# Patient Record
Sex: Male | Born: 1935
Health system: Southern US, Community
[De-identification: ages and names within clinical notes are randomized; demographics above are authoritative.]

## PROBLEM LIST (undated history)

## (undated) DIAGNOSIS — I10 Essential (primary) hypertension: Secondary | ICD-10-CM

## (undated) DIAGNOSIS — I219 Acute myocardial infarction, unspecified: Secondary | ICD-10-CM

## (undated) DIAGNOSIS — I480 Paroxysmal atrial fibrillation: Secondary | ICD-10-CM

## (undated) DIAGNOSIS — R319 Hematuria, unspecified: Secondary | ICD-10-CM

## (undated) DIAGNOSIS — I272 Pulmonary hypertension, unspecified: Secondary | ICD-10-CM

## (undated) DIAGNOSIS — U071 COVID-19: Secondary | ICD-10-CM

## (undated) DIAGNOSIS — I6529 Occlusion and stenosis of unspecified carotid artery: Secondary | ICD-10-CM

## (undated) DIAGNOSIS — E039 Hypothyroidism, unspecified: Secondary | ICD-10-CM

## (undated) DIAGNOSIS — I2510 Atherosclerotic heart disease of native coronary artery without angina pectoris: Secondary | ICD-10-CM

## (undated) DIAGNOSIS — E782 Mixed hyperlipidemia: Secondary | ICD-10-CM

## (undated) DIAGNOSIS — E0590 Thyrotoxicosis, unspecified without thyrotoxic crisis or storm: Secondary | ICD-10-CM

## (undated) DIAGNOSIS — N1832 Chronic kidney disease, stage 3b: Secondary | ICD-10-CM

## (undated) DIAGNOSIS — R609 Edema, unspecified: Secondary | ICD-10-CM

## (undated) DIAGNOSIS — K922 Gastrointestinal hemorrhage, unspecified: Secondary | ICD-10-CM

## (undated) HISTORY — DX: Chronic kidney disease, stage 3b: N18.32

## (undated) HISTORY — DX: Hematuria, unspecified: R31.9

## (undated) HISTORY — DX: Pulmonary hypertension, unspecified: I27.20

## (undated) HISTORY — DX: Mixed hyperlipidemia: E78.2

## (undated) HISTORY — DX: Edema, unspecified: R60.9

## (undated) HISTORY — DX: Essential (primary) hypertension: I10

## (undated) HISTORY — DX: Gastrointestinal hemorrhage, unspecified: K92.2

## (undated) HISTORY — PX: HERNIA REPAIR: SHX51

## (undated) HISTORY — PX: CORONARY STENT PLACEMENT: SHX1402

## (undated) HISTORY — DX: Occlusion and stenosis of unspecified carotid artery: I65.29

## (undated) HISTORY — DX: Acute myocardial infarction, unspecified: I21.9

## (undated) HISTORY — DX: Thyrotoxicosis, unspecified without thyrotoxic crisis or storm: E05.90

## (undated) HISTORY — DX: Hypothyroidism, unspecified: E03.9

## (undated) HISTORY — PX: CHOLECYSTECTOMY: SHX55

---
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
## 1999-03-26 ENCOUNTER — Emergency Department (HOSPITAL_COMMUNITY): Admission: EM | Admit: 1999-03-26 | Discharge: 1999-03-26 | Payer: Self-pay | Admitting: Emergency Medicine

## 1999-04-03 ENCOUNTER — Emergency Department (HOSPITAL_COMMUNITY): Admission: EM | Admit: 1999-04-03 | Discharge: 1999-04-03 | Payer: Self-pay | Admitting: Emergency Medicine

## 2000-08-04 ENCOUNTER — Ambulatory Visit (HOSPITAL_COMMUNITY): Admission: RE | Admit: 2000-08-04 | Discharge: 2000-08-04 | Payer: Self-pay | Admitting: Internal Medicine

## 2000-08-05 ENCOUNTER — Encounter: Payer: Self-pay | Admitting: Internal Medicine

## 2000-10-08 ENCOUNTER — Ambulatory Visit (HOSPITAL_COMMUNITY): Admission: RE | Admit: 2000-10-08 | Discharge: 2000-10-08 | Payer: Self-pay | Admitting: Internal Medicine

## 2002-08-17 ENCOUNTER — Encounter (HOSPITAL_COMMUNITY): Admission: RE | Admit: 2002-08-17 | Discharge: 2002-09-16 | Payer: Self-pay | Admitting: Cardiology

## 2002-08-17 ENCOUNTER — Encounter: Payer: Self-pay | Admitting: Cardiology

## 2002-09-07 ENCOUNTER — Ambulatory Visit (HOSPITAL_COMMUNITY): Admission: RE | Admit: 2002-09-07 | Discharge: 2002-09-07 | Payer: Self-pay | Admitting: Internal Medicine

## 2003-01-07 ENCOUNTER — Emergency Department (HOSPITAL_COMMUNITY): Admission: EM | Admit: 2003-01-07 | Discharge: 2003-01-07 | Payer: Self-pay | Admitting: Emergency Medicine

## 2003-04-12 ENCOUNTER — Ambulatory Visit (HOSPITAL_COMMUNITY): Admission: RE | Admit: 2003-04-12 | Discharge: 2003-04-12 | Payer: Self-pay | Admitting: Family Medicine

## 2003-04-19 ENCOUNTER — Ambulatory Visit (HOSPITAL_COMMUNITY): Admission: RE | Admit: 2003-04-19 | Discharge: 2003-04-19 | Payer: Self-pay | Admitting: Family Medicine

## 2003-06-20 ENCOUNTER — Ambulatory Visit (HOSPITAL_COMMUNITY): Admission: RE | Admit: 2003-06-20 | Discharge: 2003-06-20 | Payer: Self-pay | Admitting: Endocrinology

## 2003-06-20 ENCOUNTER — Encounter (INDEPENDENT_AMBULATORY_CARE_PROVIDER_SITE_OTHER): Payer: Self-pay | Admitting: *Deleted

## 2005-06-03 ENCOUNTER — Ambulatory Visit (HOSPITAL_COMMUNITY): Admission: RE | Admit: 2005-06-03 | Discharge: 2005-06-03 | Payer: Self-pay | Admitting: Family Medicine

## 2007-09-07 ENCOUNTER — Ambulatory Visit (HOSPITAL_COMMUNITY): Admission: RE | Admit: 2007-09-07 | Discharge: 2007-09-07 | Payer: Self-pay | Admitting: Family Medicine

## 2009-01-20 DIAGNOSIS — I214 Non-ST elevation (NSTEMI) myocardial infarction: Secondary | ICD-10-CM

## 2009-01-20 HISTORY — DX: Non-ST elevation (NSTEMI) myocardial infarction: I21.4

## 2009-01-26 ENCOUNTER — Inpatient Hospital Stay (HOSPITAL_COMMUNITY): Admission: EM | Admit: 2009-01-26 | Discharge: 2009-01-30 | Payer: Self-pay | Admitting: Cardiovascular Disease

## 2009-01-26 ENCOUNTER — Encounter: Payer: Self-pay | Admitting: Emergency Medicine

## 2009-01-26 ENCOUNTER — Ambulatory Visit: Payer: Self-pay | Admitting: Cardiovascular Disease

## 2009-01-29 ENCOUNTER — Encounter: Payer: Self-pay | Admitting: Cardiovascular Disease

## 2009-02-26 DIAGNOSIS — I10 Essential (primary) hypertension: Secondary | ICD-10-CM | POA: Insufficient documentation

## 2009-02-26 DIAGNOSIS — I219 Acute myocardial infarction, unspecified: Secondary | ICD-10-CM | POA: Insufficient documentation

## 2009-02-26 DIAGNOSIS — E0590 Thyrotoxicosis, unspecified without thyrotoxic crisis or storm: Secondary | ICD-10-CM | POA: Insufficient documentation

## 2009-02-26 DIAGNOSIS — E039 Hypothyroidism, unspecified: Secondary | ICD-10-CM | POA: Insufficient documentation

## 2009-02-27 ENCOUNTER — Ambulatory Visit: Payer: Self-pay | Admitting: Cardiovascular Disease

## 2009-02-27 DIAGNOSIS — R609 Edema, unspecified: Secondary | ICD-10-CM | POA: Insufficient documentation

## 2009-03-12 ENCOUNTER — Telehealth (INDEPENDENT_AMBULATORY_CARE_PROVIDER_SITE_OTHER): Payer: Self-pay | Admitting: *Deleted

## 2009-03-13 ENCOUNTER — Encounter: Payer: Self-pay | Admitting: Cardiology

## 2009-03-13 ENCOUNTER — Ambulatory Visit: Payer: Self-pay | Admitting: Cardiology

## 2009-03-13 ENCOUNTER — Encounter (HOSPITAL_COMMUNITY): Admission: RE | Admit: 2009-03-13 | Discharge: 2009-05-22 | Payer: Self-pay | Admitting: Cardiovascular Disease

## 2009-03-13 ENCOUNTER — Ambulatory Visit: Payer: Self-pay

## 2009-05-29 ENCOUNTER — Telehealth: Payer: Self-pay | Admitting: Cardiovascular Disease

## 2009-06-22 ENCOUNTER — Telehealth: Payer: Self-pay | Admitting: Cardiovascular Disease

## 2009-06-29 ENCOUNTER — Ambulatory Visit: Payer: Self-pay | Admitting: Cardiovascular Disease

## 2009-07-06 LAB — CONVERTED CEMR LAB
Basophils Absolute: 0 10*3/uL (ref 0.0–0.1)
Basophils Relative: 0.4 % (ref 0.0–3.0)
Eosinophils Absolute: 0.1 10*3/uL (ref 0.0–0.7)
Eosinophils Relative: 1.7 % (ref 0.0–5.0)
HCT: 39 % (ref 39.0–52.0)
Hemoglobin: 13.2 g/dL (ref 13.0–17.0)
Lymphocytes Relative: 28.5 % (ref 12.0–46.0)
Lymphs Abs: 1.7 10*3/uL (ref 0.7–4.0)
MCHC: 33.8 g/dL (ref 30.0–36.0)
MCV: 88.7 fL (ref 78.0–100.0)
Monocytes Absolute: 0.5 10*3/uL (ref 0.1–1.0)
Monocytes Relative: 8.1 % (ref 3.0–12.0)
Neutro Abs: 3.7 10*3/uL (ref 1.4–7.7)
Neutrophils Relative %: 61.3 % (ref 43.0–77.0)
Platelets: 167 10*3/uL (ref 150.0–400.0)
RBC: 4.41 M/uL (ref 4.22–5.81)
RDW: 13.5 % (ref 11.5–14.6)
WBC: 6 10*3/uL (ref 4.5–10.5)

## 2009-08-22 ENCOUNTER — Telehealth: Payer: Self-pay | Admitting: Cardiovascular Disease

## 2009-09-10 ENCOUNTER — Ambulatory Visit: Payer: Self-pay | Admitting: Cardiovascular Disease

## 2009-09-10 DIAGNOSIS — E782 Mixed hyperlipidemia: Secondary | ICD-10-CM | POA: Insufficient documentation

## 2010-02-19 NOTE — Assessment & Plan Note (Signed)
Summary: 6 month rov/sl   Visit Type:  Follow-up Primary Provider:  Dr.Golding  CC:  no cardiology complaints.  History of Present Illness: Derek Foster is seen today post hospital D/C for anterior MI with stent to the LAD 1/21 with Dr Derek Foster.  He had moderate residual RCA disease.  He has done well.  He is ambulating without angina.  Myovue on 2/22 showed no ischemia or infarction with EF 66%.  He has had some easy bruising on ASA/Plavix.  Dr Derek Foster suggested Vit C but I'm not sure this will help/  He wife has had problemx post back surgery with falls and a broken foot.  This has kept him from exercising and going to the [facility].  He denies SSCP, palpitations, diaphoresis, dyspnea.  He has chronic mild LE edema due to varicosities  Current Problems (verified): 1)  Edema  (ICD-782.3) 2)  Myocardial Infarction  (ICD-410.90) 3)  Hypertension  (ICD-401.9) 4)  Hypothyroidism  (ICD-244.9)  Current Medications (verified): 1)  Aspirin 81 Mg Tbec (Aspirin) .... Take One Tablet By Mouth Daily 2)  Plavix 75 Mg Tabs (Clopidogrel Bisulfate) .... Take One Tablet By Mouth Daily 3)  Nitrostat 0.4 Mg Subl (Nitroglycerin) .Marland Kitchen.. 1 Tablet Under Tongue At Onset of Chest Pain; You May Repeat Every 5 Minutes For Up To 3 Doses. 4)  Crestor 20 Mg Tabs (Rosuvastatin Calcium) .... Take One Tablet By Mouth Daily. 5)  Bisoprolol-Hydrochlorothiazide 5-6.25 Mg Tabs (Bisoprolol-Hydrochlorothiazide) .Marland Kitchen.. 1 Tab By Mouth Once Daily 6)  Fish Oil Concentrate 1000 Mg Caps (Omega-3 Fatty Acids) .... Take 2 Caps Daily 7)  Lisinopril 20 Mg Tabs (Lisinopril) .... Take One Tablet By Mouth Daily 8)  Multivitamins   Tabs (Multiple Vitamin) .Marland Kitchen.. 1 Tab By Mouth Once Daily 9)  Lansoprazole 30 Mg Tbdp (Lansoprazole) .... Take 1 Tab Daily  Allergies (verified): No Known Drug Allergies  Past History:  Past Medical History: Last updated: 03-28-09 Current Problems:  MYOCARDIAL INFARCTION (ICD-410.90) 1/11 SEMI with deep anterior T wave  inversions.  LAD stent Derek Foster moderate residual RCA disease HYPERTENSION (ICD-401.9) HYPOTHYROIDISM (ICD-244.9)  Past Surgical History: Last updated: 02/27/2009 Status post hernia repair x2.  Status post cholecystectomy x2   Total colonoscopy. Successful percutaneous coronary intervention with ultimate stenting with a 3.0- x 18-mm drug-eluting PROMUS stent postdilated   to 3.42 mm and tapering to 3.34 mm, done with bivalve.  Derek Foster, M.D.     TK/MEDQ  D:  01/27/2009  T:  01/27/2009  Job:  [id number]  cc:   Derek Foster, M.D.  Derek Carrow, MD   Family History: Last updated: 2009/03/28  Father died of an MI at age 49, sister recently died of   lung cancer.   Social History: Last updated: 03/28/2009  He lives in [place] with his wife.  He is retired   from [employer] in [place].  He has not smoked in 53 years, no alcohol.      Review of Systems       Denies fever, malais, weight loss, blurry vision, decreased visual acuity, cough, sputum, SOB, hemoptysis, pleuritic pain, palpitaitons, heartburn, abdominal pain, melena, claudication, or rash.   Vital Signs:  Patient profile:   75 year old male Weight:      259 pounds Pulse rate:   51 / minute BP sitting:   146 / 67  (right arm)  Vitals Entered By: Derek Saa, CNA (September 10, 2009 9:28 AM)  Physical Exam  General:  Affect appropriate Healthy:  appears stated age HEENT: normal  Neck supple with no adenopathy JVP normal no bruits no thyromegaly Lungs clear with no wheezing and good diaphragmatic motion Heart:  S1/S2 no murmur,rub, gallop or click PMI normal Abdomen: benighn, BS positve, no tenderness, no AAA no bruit.  No HSM or HJR Distal pulses intact with no bruits Mild  edema with bilateral varicose veins Neuro non-focal Skin warm and dry    Impression & Recommendations:  Problem # 1:  EDEMA (ICD-782.3) Stable elevate legs at end of day and low sodium diet.  Refer to vein clinic at patient  discretion  Problem # 2:  HYPERTENSION (ICD-401.9) Well controlled continue current meds His updated medication list for this problem includes:    Aspirin 81 Mg Tbec (Aspirin) .Marland Kitchen... Take one tablet by mouth daily    Bisoprolol-hydrochlorothiazide 5-6.25 Mg Tabs (Bisoprolol-hydrochlorothiazide) .Marland Kitchen... 1 tab by mouth once daily    Lisinopril 20 Mg Tabs (Lisinopril) .Marland Kitchen... Take one tablet by mouth daily  Problem # 3:  MYOCARDIAL INFARCTION (ICD-410.90) Non ischemic myovue 03/13/09 with no angina.  Continue ASA and Plavix His updated medication list for this problem includes:    Aspirin 81 Mg Tbec (Aspirin) .Marland Kitchen... Take one tablet by mouth daily    Plavix 75 Mg Tabs (Clopidogrel bisulfate) .Marland Kitchen... Take one tablet by mouth daily    Nitrostat 0.4 Mg Subl (Nitroglycerin) .Marland Kitchen... 1 tablet under tongue at onset of chest pain; you may repeat every 5 minutes for up to 3 doses.    Bisoprolol-hydrochlorothiazide 5-6.25 Mg Tabs (Bisoprolol-hydrochlorothiazide) .Marland Kitchen... 1 tab by mouth once daily    Lisinopril 20 Mg Tabs (Lisinopril) .Marland Kitchen... Take one tablet by mouth daily  Problem # 4:  MIXED HYPERLIPIDEMIA (ICD-272.2) Labs per primary.  Continue statin His updated medication list for this problem includes:    Crestor 20 Mg Tabs (Rosuvastatin calcium) .Marland Kitchen... Take one tablet by mouth daily.  Patient Instructions: 1)  Your physician recommends that you schedule a follow-up appointment in: 6 months 2)  Your physician recommends that you continue on your current medications as directed. Please refer to the Current Medication list given to you today.

## 2010-02-19 NOTE — Progress Notes (Signed)
Summary: refill request  Phone Note Refill Request   Refills Requested: Medication #1:  PLAVIX 75 MG TABS Take one tablet by mouth daily [facility]   Method Requested: Telephone to Pharmacy Initial call taken by: Glynda Jaeger,  August 22, 2009 4:27 PM    Prescriptions: PLAVIX 75 MG TABS (CLOPIDOGREL BISULFATE) Take one tablet by mouth daily  #30 x 12   Entered by:   Kem Parkinson   Authorized by:   Colon Branch, MD, [facility]   Signed by:   Kem Parkinson on 08/23/2009   Method used:   Electronically to        [facility] (retail)       [address]       [place], [place]  [address]       Ph: [phone]       Fax: [phone]   RxID:   [id number]

## 2010-02-19 NOTE — Assessment & Plan Note (Signed)
Summary: Cardiology Nuclear Testing  Nuclear Med Background Indications for Stress Test: Evaluation for Ischemia, Stent Patency   History: Echo, Heart Catheterization, Myocardial Infarction, Stents  History Comments: 01/27/09 MI: NSTEMI > Heart Cath: EF=50%, 95-99% LAD > LAD stent 1/11 Echo: EF=50-55%, mild LVH     Nuclear Pre-Procedure Cardiac Risk Factors: Family History - CAD, History of Smoking, Hypertension, Lipids Caffeine/Decaff Intake: none NPO After: 8:30 AM Lungs: clear IV 0.9% NS with Angio Cath: 18g     IV Site: (R) AC IV Started by: Stanton Kidney EMT-P Chest Size (in) 48     Height (in): 72 Weight (lb): 271 BMI: 36.89  Nuclear Med Study 1 or 2 day study:  1 day     Stress Test Type:  Stress Reading MD:  Olga Millers, MD     Referring MD:  P.Nishan Resting Radionuclide:  Technetium 36m Tetrofosmin     Resting Radionuclide Dose:  10.7 mCi  Stress Radionuclide:  Technetium 40m Tetrofosmin     Stress Radionuclide Dose:  33.0 mCi   Stress Protocol Exercise Time (min):  4:01 min     Max HR:  150 bpm     Predicted Max HR:  148 bpm  Max Systolic BP: 200 mm Hg     Percent Max HR:  101.35 %     METS: 5.8 Rate Pressure Product:  47829    Stress Test Technologist:  Milana Na EMT-P     Nuclear Technologist:  Burna Mortimer Deal RT-N  Rest Procedure  Myocardial perfusion imaging was performed at rest 45 minutes following the intravenous administration of Myoview Technetium 72m Tetrofosmin.  Stress Procedure  The patient exercised for 4:01.  The patient stopped due to fatigue and denied any chest pain.  There were non specific ST-T wave changes, + occ pvcs, pacs, cuplets, and ventricular tachycardia.  Myoview was injected at peak exercise and myocardial perfusion imaging was performed after a brief delay.  QPS Raw Data Images:  Acuisition technically good; normal left ventricular size. Stress Images:  There is normal uptake in all areas. Rest Images:  Normal homogeneous  uptake in all areas of the myocardium. Subtraction (SDS):  No evidence of ischemia. Transient Ischemic Dilatation:  .84  (Normal <1.22)  Lung/Heart Ratio:  .30  (Normal <0.45)  Quantitative Gated Spect Images QGS EDV:  120 ml QGS ESV:  41 ml QGS EF:  66 % QGS cine images:  Normal wall motion.   Overall Impression  Exercise Capacity: Poor exercise capacity. BP Response: Hypertensive blood pressure response. Clinical Symptoms: No chest pain ECG Impression: No diagnostic ST segment change suggestive of ischemia; occasional PVCs and nonsustained VT (longest 5 beats). Overall Impression: There is no sign of scar or ischemia.  Appended Document: Cardiology Nuclear Testing Normal nuclear  Appended Document: Cardiology Nuclear Testing Left message to call back    Appended Document: Cardiology Nuclear Testing pt aware of results

## 2010-02-19 NOTE — Progress Notes (Signed)
Summary: Nuclear Pre-Procedure  Phone Note Outgoing Call Call back at [facility] Phone [phone]   Call placed by: Stanton Kidney, EMT-P,  March 12, 2009 2:51 PM Action Taken: Phone Call Completed Summary of Call: Reviewed information on Myoview Information Sheet (see scanned document for further details).  Spoke with Patient's wife.    Nuclear Med Background Indications for Stress Test: Evaluation for Ischemia, Stent Patency   History: Echo, Heart Catheterization, Myocardial Infarction, Stents  History Comments: 01/27/09 MI: NSTEMI > Heart Cath: EF=50%, 95-99% LAD > LAD stent 1/11 Echo: EF=50-55%, mild LVH     Nuclear Pre-Procedure Cardiac Risk Factors: Family History - CAD, History of Smoking, Hypertension, Lipids Height (in): 72

## 2010-02-19 NOTE — Miscellaneous (Signed)
  Clinical Lists Changes  Observations: Added new observation of CARDCATHFIND:  IMPRESSION:   1. Acute coronary artery syndrome/onset non-ST-segment elevation       myocardial infarction resulting in deeply inverted T-wave       inversions precordially secondary to subtotal high-grade stenosis       of the left anterior descending artery proximally.  Non-ST-segment       elevation MI and is now status post percutaneous coronary       intervention/stenting to a high-grade 95-99% left anterior       descending stenosis.  The patient will ultimately require possible       further intervention to his right coronary artery subsequently.  He       will be started on aggressive medical therapy.   2. Normal codominant circumflex system.   3. A 70-80% mid right coronary artery narrowing.   4. Mild left ventricular dysfunction with an ejection fraction of 50%       with mild hypo-contractility in anterolateral wall.   5. Successful percutaneous coronary intervention with ultimate       stenting with a 3.0- x 18-mm drug-eluting PROMUS stent postdilated       to 3.42 mm and tapering to 3.34 mm, done with bivalve.   6. Bivalirudin/Plavix/intracoronary and intravascular nitroglycerin.                  ______________________________   Nicki Guadalajara, M.D.            TK/MEDQ  D:  01/27/2009  T:  01/27/2009  Job:  [id number]      cc:   Patrica Duel, M.D.   Verne Carrow, MD   (01/27/2009 15:04)      Cardiac Cath  Procedure date:  01/27/2009  Findings:       IMPRESSION:   1. Acute coronary artery syndrome/onset non-ST-segment elevation       myocardial infarction resulting in deeply inverted T-wave       inversions precordially secondary to subtotal high-grade stenosis       of the left anterior descending artery proximally.  Non-ST-segment       elevation MI and is now status post percutaneous coronary       intervention/stenting to a high-grade 95-99% left anterior   descending stenosis.  The patient will ultimately require possible       further intervention to his right coronary artery subsequently.  He       will be started on aggressive medical therapy.   2. Normal codominant circumflex system.   3. A 70-80% mid right coronary artery narrowing.   4. Mild left ventricular dysfunction with an ejection fraction of 50%       with mild hypo-contractility in anterolateral wall.   5. Successful percutaneous coronary intervention with ultimate       stenting with a 3.0- x 18-mm drug-eluting PROMUS stent postdilated       to 3.42 mm and tapering to 3.34 mm, done with bivalve.   6. Bivalirudin/Plavix/intracoronary and intravascular nitroglycerin.                  ______________________________   Nicki Guadalajara, M.D.            TK/MEDQ  D:  01/27/2009  T:  01/27/2009  Job:  [id number]      cc:   Patrica Duel, M.D.   Verne Carrow, MD

## 2010-02-19 NOTE — Progress Notes (Signed)
Summary: [facility] Paperwork  Phone Note From Other Clinic Call back at [phone]   Caller: Erin/[facility] [place] Summary of Call: request call back about paperwork she faxed over on 4/28 Initial call taken by: Migdalia Dk,  May 29, 2009 2:41 PM  Follow-up for Phone Call        spoke with erin, paperwork never received. she will refax Deliah Goody, RN  May 29, 2009 3:10 PM

## 2010-02-19 NOTE — Assessment & Plan Note (Signed)
Summary: eph/per aft hrs sheet/jss   History of Present Illness: Derek Foster is seen today post hospital D/C for anterior MI with stent to the LAD 1/21 with Dr Tresa Endo.  He had moderate residual RCA disease.  He has done well.  He is ambulating without angina.  He is compliant with his meds.  I told him I would like him to have an ETT mhyovue before going to the [facility] to assess scar burden and ischemia in the RCA territory.  He denies SOB, palpitations, syncope.  He is carrying nitro with him.    Current Problems (verified): 1)  Myocardial Infarction  (ICD-410.90) 2)  Hypertension  (ICD-401.9) 3)  Hypothyroidism  (ICD-244.9)  Current Medications (verified): 1)  Aspirin Ec 325 Mg Tbec (Aspirin) .... Take One Tablet By Mouth Daily 2)  Plavix 75 Mg Tabs (Clopidogrel Bisulfate) .... Take One Tablet By Mouth Daily 3)  Nitrostat 0.4 Mg Subl (Nitroglycerin) .Marland Kitchen.. 1 Tablet Under Tongue At Onset of Chest Pain; You May Repeat Every 5 Minutes For Up To 3 Doses. 4)  Crestor 20 Mg Tabs (Rosuvastatin Calcium) .... Take One Tablet By Mouth Daily. 5)  Bisoprolol-Hydrochlorothiazide 5-6.25 Mg Tabs (Bisoprolol-Hydrochlorothiazide) .Marland Kitchen.. 1 Tab By Mouth Once Daily 6)  Fish Oil   Oil (Fish Oil) .Marland Kitchen.. 1 Tab By Mouth Once Daily 7)  Levothroid 50 Mcg Tabs (Levothyroxine Sodium) .Marland Kitchen.. 1 Tab By Mouth Once Daily 8)  Lisinopril 20 Mg Tabs (Lisinopril) .... Take One Tablet By Mouth Daily 9)  Multivitamins   Tabs (Multiple Vitamin) .Marland Kitchen.. 1 Tab By Mouth Once Daily 10)  Eye Drops Allergy Relief 0.05-0.25 % Soln (Tetrahydrozoline-Zn Sulfate) .... As Directed  Allergies (verified): No Known Drug Allergies  Past History:  Past Medical History: Last updated: Feb 27, 2009 Current Problems:  MYOCARDIAL INFARCTION (ICD-410.90) 1/11 SEMI with deep anterior T wave inversions.  LAD stent [name] moderate residual RCA disease HYPERTENSION (ICD-401.9) HYPOTHYROIDISM (ICD-244.9)  Past Surgical History: Last updated: February 27, 2009 Status post  hernia repair x2.  Status post cholecystectomy x2   Total colonoscopy.  Family History: Last updated: 02/27/2009  Father died of an MI at age 54, sister recently died of   lung cancer.   Social History: Last updated: 2009-02-27  He lives in [place] with his wife.  He is retired   from [employer]er, [employer] in [place].  He has not smoked in 53 years, no alcohol.      Review of Systems       Denies fever, malais, weight loss, blurry vision, decreased visual acuity, cough, sputum, SOB, hemoptysis, pleuritic pain, palpitaitons, heartburn, abdominal pain, melena, lower extremity edema, claudication, or rash. all other systems reviewed and negative  Vital Signs:  Patient profile:   75 year old male Height:      72 inches Weight:      276 pounds BMI:     37.57 Pulse rate:   57 / minute Resp:     12 per minute BP sitting:   147 / 76  (left arm)  Vitals Entered By: Kem Parkinson (February 27, 2009 3:10 PM)  Physical Exam  General:  Affect appropriate Healthy:  appears stated age HEENT: normal Neck supple with no adenopathy JVP normal no bruits no thyromegaly Lungs clear with no wheezing and good diaphragmatic motion Heart:  S1/S2 no murmur,rub, gallop or click PMI normal Abdomen: benighn, BS positve, no tenderness, no AAA no bruit.  No HSM or HJR Distal pulses intact with no bruits No edema Neuro non-focal Skin warm and dry  Impression & Recommendations:  Problem # 1:  MYOCARDIAL INFARCTION (ICD-410.90) Myovue in 2 weeks to assess RCA lesion and EF. His updated medication list for this problem includes:    Aspirin Ec 325 Mg Tbec (Aspirin) .Marland Kitchen... Take one tablet by mouth daily    Plavix 75 Mg Tabs (Clopidogrel bisulfate) .Marland Kitchen... Take one tablet by mouth daily    Nitrostat 0.4 Mg Subl (Nitroglycerin) .Marland Kitchen... 1 tablet under tongue at onset of chest pain; you may repeat every 5 minutes for up to 3 doses.    Bisoprolol-hydrochlorothiazide 5-6.25 Mg Tabs  (Bisoprolol-hydrochlorothiazide) .Marland Kitchen... 1 tab by mouth once daily    Lisinopril 20 Mg Tabs (Lisinopril) .Marland Kitchen... Take one tablet by mouth daily  Orders: Nuclear Stress Test (Nuc Stress Test)  Problem # 2:  HYPERTENSION (ICD-401.9) Well controlled His updated medication list for this problem includes:    Aspirin Ec 325 Mg Tbec (Aspirin) .Marland Kitchen... Take one tablet by mouth daily    Bisoprolol-hydrochlorothiazide 5-6.25 Mg Tabs (Bisoprolol-hydrochlorothiazide) .Marland Kitchen... 1 tab by mouth once daily    Lisinopril 20 Mg Tabs (Lisinopril) .Marland Kitchen... Take one tablet by mouth daily  Problem # 3:  EDEMA (ICD-782.3) Chronic.  Preceded MI.  Continue low dose diuretic.    Patient Instructions: 1)  Your physician recommends that you schedule a follow-up appointment in: 6 MONTHS 2)  Your physician has requested that you have an exercise stress myoview.  For further information please visit [email or web address]  Please follow instruction sheet, as given.   EKG Report  Procedure date:  02/27/2009  Findings:      Sinus brady 52 otherwise normal ECG

## 2010-02-19 NOTE — Progress Notes (Signed)
Summary: questions about Aspirin  Phone Note Call from Patient Call back at Home Phone [phone]   Caller: Patient Summary of Call: Pt have question  about taking Aspirin Initial call taken by: Judie Grieve,  June 22, 2009 10:57 AM  Follow-up for Phone Call        spoke with pt, he was recently seen by his medical md and he has alot of bruising. he questioned if he could decrease his aspirin to 81mg . okay given for pt to decrease aspirin Deliah Goody, RN  June 22, 2009 1:31 PM   Additional Follow-up for Phone Call Additional follow up Details #1::        Should have CBC and PLT count as well Additional Follow-up by: Colon Branch, MD, [facility],  June 25, 2009 4:56 PM    Additional Follow-up for Phone Call Additional follow up Details #2::    PT AWARE WILL COME IN ON FRIDAY 06/29/09 FOR LABS. Follow-up by: Scherrie Bateman, LPN,  June 27, 1476 2:11 PM  New/Updated Medications: ASPIRIN 81 MG TBEC (ASPIRIN) Take one tablet by mouth daily

## 2010-04-07 LAB — HEMOGLOBIN A1C
Hgb A1c MFr Bld: 4.5 % — ABNORMAL LOW (ref 4.6–6.1)
Mean Plasma Glucose: 82 mg/dL

## 2010-04-07 LAB — POCT I-STAT, CHEM 8
BUN: 10 mg/dL (ref 6–23)
Calcium, Ion: 1.15 mmol/L (ref 1.12–1.32)
Chloride: 102 mEq/L (ref 96–112)
Creatinine, Ser: 0.8 mg/dL (ref 0.4–1.5)
Glucose, Bld: 114 mg/dL — ABNORMAL HIGH (ref 70–99)
HCT: 35 % — ABNORMAL LOW (ref 39.0–52.0)
Hemoglobin: 11.9 g/dL — ABNORMAL LOW (ref 13.0–17.0)
Potassium: 3.7 mEq/L (ref 3.5–5.1)
Sodium: 136 mEq/L (ref 135–145)
TCO2: 24 mmol/L (ref 0–100)

## 2010-04-07 LAB — CBC
HCT: 34.8 % — ABNORMAL LOW (ref 39.0–52.0)
HCT: 37.2 % — ABNORMAL LOW (ref 39.0–52.0)
HCT: 40.2 % (ref 39.0–52.0)
Hemoglobin: 12.3 g/dL — ABNORMAL LOW (ref 13.0–17.0)
Hemoglobin: 12.9 g/dL — ABNORMAL LOW (ref 13.0–17.0)
Hemoglobin: 13.6 g/dL (ref 13.0–17.0)
MCHC: 33.7 g/dL (ref 30.0–36.0)
MCHC: 34.5 g/dL (ref 30.0–36.0)
MCHC: 35.3 g/dL (ref 30.0–36.0)
MCV: 88.9 fL (ref 78.0–100.0)
MCV: 89.5 fL (ref 78.0–100.0)
MCV: 89.8 fL (ref 78.0–100.0)
Platelets: 146 10*3/uL — ABNORMAL LOW (ref 150–400)
Platelets: 153 10*3/uL (ref 150–400)
Platelets: 181 10*3/uL (ref 150–400)
RBC: 3.91 MIL/uL — ABNORMAL LOW (ref 4.22–5.81)
RBC: 4.16 MIL/uL — ABNORMAL LOW (ref 4.22–5.81)
RBC: 4.47 MIL/uL (ref 4.22–5.81)
RDW: 13.1 % (ref 11.5–15.5)
RDW: 13.4 % (ref 11.5–15.5)
RDW: 13.5 % (ref 11.5–15.5)
WBC: 6.1 10*3/uL (ref 4.0–10.5)
WBC: 6.8 10*3/uL (ref 4.0–10.5)
WBC: 7.2 10*3/uL (ref 4.0–10.5)

## 2010-04-07 LAB — DIFFERENTIAL
Basophils Absolute: 0 10*3/uL (ref 0.0–0.1)
Basophils Absolute: 0 10*3/uL (ref 0.0–0.1)
Basophils Relative: 0 % (ref 0–1)
Basophils Relative: 1 % (ref 0–1)
Eosinophils Absolute: 0.1 10*3/uL (ref 0.0–0.7)
Eosinophils Absolute: 0.1 10*3/uL (ref 0.0–0.7)
Eosinophils Relative: 1 % (ref 0–5)
Eosinophils Relative: 2 % (ref 0–5)
Lymphocytes Relative: 22 % (ref 12–46)
Lymphocytes Relative: 23 % (ref 12–46)
Lymphs Abs: 1.4 10*3/uL (ref 0.7–4.0)
Lymphs Abs: 1.6 10*3/uL (ref 0.7–4.0)
Monocytes Absolute: 0.5 10*3/uL (ref 0.1–1.0)
Monocytes Absolute: 0.7 10*3/uL (ref 0.1–1.0)
Monocytes Relative: 10 % (ref 3–12)
Monocytes Relative: 8 % (ref 3–12)
Neutro Abs: 4.1 10*3/uL (ref 1.7–7.7)
Neutro Abs: 4.8 10*3/uL (ref 1.7–7.7)
Neutrophils Relative %: 66 % (ref 43–77)
Neutrophils Relative %: 68 % (ref 43–77)

## 2010-04-07 LAB — BRAIN NATRIURETIC PEPTIDE: Pro B Natriuretic peptide (BNP): 286 pg/mL — ABNORMAL HIGH (ref 0.0–100.0)

## 2010-04-07 LAB — CARDIAC PANEL(CRET KIN+CKTOT+MB+TROPI)
CK, MB: 10.8 ng/mL (ref 0.3–4.0)
CK, MB: 2.2 ng/mL (ref 0.3–4.0)
CK, MB: 6.7 ng/mL (ref 0.3–4.0)
CK, MB: 8.8 ng/mL (ref 0.3–4.0)
Relative Index: INVALID (ref 0.0–2.5)
Relative Index: INVALID (ref 0.0–2.5)
Relative Index: INVALID (ref 0.0–2.5)
Relative Index: INVALID (ref 0.0–2.5)
Total CK: 25 U/L (ref 7–232)
Total CK: 36 U/L (ref 7–232)
Total CK: 52 U/L (ref 7–232)
Total CK: 60 U/L (ref 7–232)
Troponin I: 0.44 ng/mL — ABNORMAL HIGH (ref 0.00–0.06)
Troponin I: 0.92 ng/mL (ref 0.00–0.06)
Troponin I: 1.03 ng/mL (ref 0.00–0.06)
Troponin I: 1.2 ng/mL (ref 0.00–0.06)

## 2010-04-07 LAB — LIPID PANEL
Cholesterol: 147 mg/dL (ref 0–200)
HDL: 31 mg/dL — ABNORMAL LOW (ref >39)
LDL Cholesterol: 104 mg/dL — ABNORMAL HIGH (ref 0–99)
Total CHOL/HDL Ratio: 4.7 RATIO
Triglycerides: 60 mg/dL (ref <150)
VLDL: 12 mg/dL (ref 0–40)

## 2010-04-07 LAB — BASIC METABOLIC PANEL
BUN: 13 mg/dL (ref 6–23)
BUN: 9 mg/dL (ref 6–23)
CO2: 28 mEq/L (ref 19–32)
CO2: 29 mEq/L (ref 19–32)
Calcium: 8.3 mg/dL — ABNORMAL LOW (ref 8.4–10.5)
Calcium: 9 mg/dL (ref 8.4–10.5)
Chloride: 104 mEq/L (ref 96–112)
Chloride: 104 mEq/L (ref 96–112)
Creatinine, Ser: 0.86 mg/dL (ref 0.4–1.5)
Creatinine, Ser: 1.07 mg/dL (ref 0.4–1.5)
GFR calc Af Amer: >60 mL/min (ref >60)
GFR calc Af Amer: >60 mL/min (ref >60)
GFR calc non Af Amer: >60 mL/min (ref >60)
GFR calc non Af Amer: >60 mL/min (ref >60)
Glucose, Bld: 126 mg/dL — ABNORMAL HIGH (ref 70–99)
Glucose, Bld: 97 mg/dL (ref 70–99)
Potassium: 3.4 mEq/L — ABNORMAL LOW (ref 3.5–5.1)
Potassium: 3.9 mEq/L (ref 3.5–5.1)
Sodium: 139 mEq/L (ref 135–145)
Sodium: 141 mEq/L (ref 135–145)

## 2010-04-07 LAB — COMPREHENSIVE METABOLIC PANEL
ALT: 21 U/L (ref 0–53)
AST: 25 U/L (ref 0–37)
Albumin: 3.2 g/dL — ABNORMAL LOW (ref 3.5–5.2)
Alkaline Phosphatase: 64 U/L (ref 39–117)
BUN: 10 mg/dL (ref 6–23)
CO2: 23 mEq/L (ref 19–32)
Calcium: 8.6 mg/dL (ref 8.4–10.5)
Chloride: 104 mEq/L (ref 96–112)
Creatinine, Ser: 0.87 mg/dL (ref 0.4–1.5)
GFR calc Af Amer: >60 mL/min (ref >60)
GFR calc non Af Amer: >60 mL/min (ref >60)
Glucose, Bld: 105 mg/dL — ABNORMAL HIGH (ref 70–99)
Potassium: 3.9 mEq/L (ref 3.5–5.1)
Sodium: 138 mEq/L (ref 135–145)
Total Bilirubin: 0.8 mg/dL (ref 0.3–1.2)
Total Protein: 6.1 g/dL (ref 6.0–8.3)

## 2010-04-07 LAB — POCT CARDIAC MARKERS
CKMB, poc: 2.6 ng/mL (ref 1.0–8.0)
Myoglobin, poc: 92.2 ng/mL (ref 12–200)
Troponin i, poc: 0.07 ng/mL (ref 0.00–0.09)

## 2010-04-07 LAB — TSH: TSH: 0.013 u[IU]/mL — ABNORMAL LOW (ref 0.350–4.500)

## 2010-04-07 LAB — HEPARIN LEVEL (UNFRACTIONATED): Heparin Unfractionated: >2 IU/mL — ABNORMAL HIGH (ref 0.30–0.70)

## 2010-04-07 LAB — PROTIME-INR
INR: 1.11 (ref 0.00–1.49)
Prothrombin Time: 14.2 seconds (ref 11.6–15.2)

## 2010-04-07 LAB — APTT: aPTT: 42 seconds — ABNORMAL HIGH (ref 24–37)

## 2010-04-07 LAB — MAGNESIUM: Magnesium: 2 mg/dL (ref 1.5–2.5)

## 2010-04-13 ENCOUNTER — Encounter: Payer: Self-pay | Admitting: Cardiovascular Disease

## 2010-04-23 ENCOUNTER — Ambulatory Visit (INDEPENDENT_AMBULATORY_CARE_PROVIDER_SITE_OTHER): Payer: Medicare Other | Admitting: Cardiovascular Disease

## 2010-04-23 ENCOUNTER — Encounter: Payer: Self-pay | Admitting: Cardiovascular Disease

## 2010-04-23 DIAGNOSIS — E782 Mixed hyperlipidemia: Secondary | ICD-10-CM

## 2010-04-23 DIAGNOSIS — R609 Edema, unspecified: Secondary | ICD-10-CM

## 2010-04-23 DIAGNOSIS — I219 Acute myocardial infarction, unspecified: Secondary | ICD-10-CM

## 2010-04-23 DIAGNOSIS — I10 Essential (primary) hypertension: Secondary | ICD-10-CM

## 2010-04-23 NOTE — Assessment & Plan Note (Signed)
Stop Crestor due to leg pain.  May be neuropathy but will see.  Discussed low carb diet

## 2010-04-23 NOTE — Progress Notes (Signed)
Derek Foster is seen today post hospital D/C for anterior MI with stent to the LAD 1/21 with Dr Tresa Endo.  He had moderate residual RCA disease.  He has done well.  He is ambulating without angina.  Myovue on 2/22 showed no ischemia or infarction with EF 66%.  He has had some easy bruising on ASA/Plavix.  Dr Renette Butters suggested Vit C but I'm not sure this will help/  He wife has had problemx post back surgery with falls and a broken foot.  This has kept him from exercising and going to the [facility].  He denies SSCP, palpitations, diaphoresis, dyspnea.  He has chronic mild LE edema due to varicosities  ROS: Denies fever, malais, weight loss, blurry vision, decreased visual acuity, cough, sputum, SOB, hemoptysis, pleuritic pain, palpitaitons, heartburn, abdominal pain, melena, lower extremity edema, claudication, or rash.   General: Affect appropriate Healthy:  appears stated age HEENT: normal Neck supple with no adenopathy JVP normal no bruits no thyromegaly Lungs clear with no wheezing and good diaphragmatic motion Heart:  S1/S2 no murmur,rub, gallop or click PMI normal Abdomen: benighn, BS positve, no tenderness, no AAA no bruit.  No HSM or HJR Distal pulses intact with no bruits Trace  Edema bilateral varicose veins Neuro non-focal Skin warm and dry No muscular weakness   Current Outpatient Prescriptions  Medication Sig Dispense Refill  . aspirin 81 MG tablet Take 81 mg by mouth daily.        . bisoprolol-hydrochlorothiazide (ZIAC) 5-6.25 MG per tablet Take 1 tablet by mouth daily.        . clopidogrel (PLAVIX) 75 MG tablet Take 75 mg by mouth daily.        . fish oil-omega-3 fatty acids 1000 MG capsule Take 2 g by mouth daily.        . lansoprazole (PREVACID) 30 MG capsule Take 30 mg by mouth daily.        Marland Kitchen lisinopril (PRINIVIL,ZESTRIL) 20 MG tablet Take 20 mg by mouth daily.        . Multiple Vitamin (MULTIVITAMIN) tablet Take 1 tablet by mouth daily.        . nitroGLYCERIN (NITROSTAT) 0.4 MG SL  tablet Place 0.4 mg under the tongue every 5 (five) minutes as needed.        . rosuvastatin (CRESTOR) 20 MG tablet Take 20 mg by mouth daily.          Allergies  Review of patient's allergies indicates not on file.  Electrocardiogram:  Sinus bradycardia 50 otherwise normal ECG  Assessment and Plan

## 2010-04-23 NOTE — Assessment & Plan Note (Signed)
May need to increase ACE in future Continue to monitor at home

## 2010-04-23 NOTE — Patient Instructions (Addendum)
Schedule F/U with Dr. Eden Emms in 6 months. Your physician has recommended you make the following change in your medication: HOLD crestor for now. Please call us in a few weeks to let us know if the leg cramps have improved.

## 2010-04-23 NOTE — Assessment & Plan Note (Signed)
S/P LAD stent with moderate RCA disease.  Nonischemic myovue 03/13/09.  Has nitro with him. Well beta blocked.  Continue medical Rx

## 2010-04-23 NOTE — Assessment & Plan Note (Signed)
From venous insuf. And varicose viens  Continue current dose of diuretic

## 2010-05-15 ENCOUNTER — Telehealth: Payer: Self-pay | Admitting: Cardiovascular Disease

## 2010-05-15 NOTE — Telephone Encounter (Signed)
Pt called after d/c Crestor.  Leg pain is no better and will start back on Crestor.  This is info only for Dr. Dale Dover

## 2010-05-15 NOTE — Telephone Encounter (Signed)
Will make dr nishan aware [place]  

## 2010-06-07 NOTE — Op Note (Signed)
   NAME:  Derek Foster, Derek Foster NO.:  [id number]   MEDICAL RECORD NO.:  [id number]                   PATIENT TYPE:  AMB   LOCATION:  DAY                                  FACILITY:  [facility]   PHYSICIAN:  Lionel December, M.D.                 DATE OF BIRTH:  05/20/1935   DATE OF PROCEDURE:  09/07/2002  DATE OF DISCHARGE:                                 OPERATIVE REPORT   PROCEDURE:  Total colonoscopy.   INDICATIONS FOR PROCEDURE:  Abiel is a 75 year old Caucasian male who is  undergoing screening colonoscopy.  Family history is negative for CRC.  The  procedure was reviewed with the patient, and informed consent was obtained.   PREOPERATIVE MEDICATIONS:  Demerol 50 mg IV, Versed 6 mg IV.   FINDINGS:  The procedure was performed in the endoscopy suite.  The  patient's vital signs and O2 saturations were monitored during the procedure  and remained stable.  The patient was placed in the left lateral recumbent  position and rectal examination performed.  No abnormality noted on external  or digital exam.  The Olympus videoscope was placed into the rectum and  advanced into the region of the sigmoid colon and beyond.  The preparation  was satisfactory.  The scope was passed to the cecum which was identified by  the appendiceal orifice and ileocecal valve.  Pictures were taken for the  record.  There was a small flat polyp on a fold at the ascending colon which  was coagulated using the snare tip.  There was another 7 to 8-mm polyp at  the mid or distal sigmoid colon which was snared and retrieved for  histologic examination.  The mucosa of the rest of the colon was normal.  The rectal mucosa was also normal.  The scope was retroflexed to examine the  anorectal junction which was unremarkable.  The endoscope was straightened  and withdrawn.  The patient tolerated the procedure well.   FINAL DIAGNOSES:  1. A 7 to 8-mm polyp snared from the sigmoid colon.  2. Another  small flat polyp at the ascending colon which was coagulated.    RECOMMENDATIONS:  1. Standard instructions given.  2. I will contact the patient with the biopsy results and further     recommendations.                                               Lionel December, M.D.    NR/MEDQ  D:  09/07/2002  T:  09/07/2002  Job:  [id number]   cc:   Patrica Duel, M.D.  [address]  [place]  [place] [address]  Fax: [phone]

## 2011-02-24 ENCOUNTER — Encounter: Payer: Self-pay | Admitting: *Deleted

## 2011-02-25 ENCOUNTER — Encounter: Payer: Self-pay | Admitting: Cardiovascular Disease

## 2011-02-25 ENCOUNTER — Ambulatory Visit (INDEPENDENT_AMBULATORY_CARE_PROVIDER_SITE_OTHER): Payer: Medicare Other | Admitting: Cardiovascular Disease

## 2011-02-25 DIAGNOSIS — I219 Acute myocardial infarction, unspecified: Secondary | ICD-10-CM

## 2011-02-25 DIAGNOSIS — E782 Mixed hyperlipidemia: Secondary | ICD-10-CM

## 2011-02-25 DIAGNOSIS — I10 Essential (primary) hypertension: Secondary | ICD-10-CM

## 2011-02-25 DIAGNOSIS — R609 Edema, unspecified: Secondary | ICD-10-CM

## 2011-02-25 MED ORDER — SIMVASTATIN 20 MG PO TABS: 20.0000 mg | ORAL_TABLET | Freq: Every evening | ORAL | Status: DC

## 2011-02-25 NOTE — Assessment & Plan Note (Signed)
Stable with no angina and good activity level.  Continue medical Rx Has nitro  

## 2011-02-25 NOTE — Progress Notes (Signed)
Derek Foster is seen today post hospital D/C for anterior MI with stent to the LAD 1/21 with Dr Tresa Endo. He had moderate residual RCA disease. He has done well. He is ambulating without angina. Myovue on 2/22 showed no ischemia or infarction with EF 66%. He has had some easy bruising on ASA/Plavix. Dr Renette Butters suggested Vit C but I'm not sure this will help/ He wife has had problemx post back surgery with falls and a broken foot. This has kept him from exercising and going to the [facility]. He denies SSCP, palpitations, diaphoresis, dyspnea. He has chronic mild LE edema due to varicosities  ? Pain from crestor in legs.  Wifes chronic back problems really affecting him.    ROS: Denies fever, malais, weight loss, blurry vision, decreased visual acuity, cough, sputum, SOB, hemoptysis, pleuritic pain, palpitaitons, heartburn, abdominal pain, melena, lower extremity edema, claudication, or rash.  All other systems reviewed and negative  General: Affect appropriate Obese elderly male HEENT: normal Neck supple with no adenopathy JVP normal no bruits no thyromegaly Lungs clear with no wheezing and good diaphragmatic motion Heart:  S1/S2 no murmur, no rub, gallop or click PMI normal Abdomen: benighn, BS positve, no tenderness, no AAA no bruit.  No HSM or HJR Distal pulses intact with no bruits Plus one bilateral  Edema with superficial varicosities Neuro non-focal Skin warm and dry No muscular weakness   Current Outpatient Prescriptions  Medication Sig Dispense Refill  . amLODipine-olmesartan (AZOR) 5-20 MG per tablet Take 1 tablet by mouth daily.      . Ascorbic Acid (VITAMIN C) 500 MG tablet Take 500 mg by mouth daily.        Marland Kitchen aspirin 81 MG tablet Take 81 mg by mouth daily.        . clopidogrel (PLAVIX) 75 MG tablet Take 75 mg by mouth daily.        . Coenzyme Q10 200 MG capsule Take 200 mg by mouth daily.        . fish oil-omega-3 fatty acids 1000 MG capsule Take 2 g by mouth daily.        . lansoprazole  (PREVACID SOLUTAB) 30 MG disintegrating tablet Take 30 mg by mouth daily.        . Multiple Vitamin (MULTIVITAMIN) tablet Take 1 tablet by mouth daily.        . nitroGLYCERIN (NITROSTAT) 0.4 MG SL tablet Place 0.4 mg under the tongue every 5 (five) minutes as needed.        . rosuvastatin (CRESTOR) 20 MG tablet Take 20 mg by mouth daily.      . bisoprolol-hydrochlorothiazide (ZIAC) 5-6.25 MG per tablet Take 1 tablet by mouth daily.          Allergies  Review of patient's allergies indicates no known allergies.  Electrocardiogram:  Assessment and Plan

## 2011-02-25 NOTE — Patient Instructions (Signed)
Your physician wants you to follow-up in: YEAR WITH DR Haywood Filler will receive a reminder letter in the mail two months in advance. If you don't receive a letter, please call our office to schedule the follow-up appointment. Your physician has recommended you make the following change in your medication: STOP CRESTOR START SIMVASTATIN  20 MG  Your physician recommends that you return for lab work in: FASTING LIPID LIVER IN 3 MONTHS  DX 272.4 V58.69

## 2011-02-25 NOTE — Assessment & Plan Note (Signed)
Well controlled.  Continue current medications and low sodium Dash type diet.    

## 2011-02-25 NOTE — Assessment & Plan Note (Signed)
From varicosities  PRN diuretics and low sodium diet

## 2011-02-25 NOTE — Assessment & Plan Note (Addendum)
Cholesterol is at goal.  Continue current dose of statin and diet Rx.  No myalgias or side effects.  F/U  LFT's in 6 months. Lab Results  Component Value Date   LDLCALC  Value: 104        Total Cholesterol/HDL:CHD Risk Coronary Heart Disease Risk Table                     Men   Women  1/2 Average Risk   3.4   3.3  Average Risk       5.0   4.4  2 X Average Risk   9.6   7.1  3 X Average Risk  23.4   11.0        Use the calculated Patient Ratio above and the CHD Risk Table to determine the patient's CHD Risk.        ATP III CLASSIFICATION (LDL):  <100     mg/dL   Optimal  213-086  mg/dL   Near or Above                    Optimal  130-159  mg/dL   Borderline  578-469  mg/dL   High  >629     mg/dL   Very High* 05/22/8411    C        Change to zocor 20mg  and see if pain in legs improve

## 2011-04-02 ENCOUNTER — Telehealth: Payer: Self-pay | Admitting: Cardiovascular Disease

## 2011-04-02 ENCOUNTER — Other Ambulatory Visit: Payer: Self-pay | Admitting: *Deleted

## 2011-04-02 MED ORDER — PRAVASTATIN SODIUM 10 MG PO TABS: 10.0000 mg | ORAL_TABLET | Freq: Every evening | ORAL | Status: DC

## 2011-04-02 NOTE — Telephone Encounter (Signed)
Pt states that he is experiencing the same pain with Zocor in his lower legs and feet that he was having with Crestor.  Wants to changes meds again.  Will forward to Dr. Eden Emms for review.

## 2011-04-02 NOTE — Telephone Encounter (Signed)
Pt notified and pravachol sent to pharmacy.

## 2011-04-02 NOTE — Telephone Encounter (Signed)
Please return call to patient at hm# [phone]   Patient c/o problems with simvastatin (ZOCOR) 20 MG tablet.   Experiencing sharp, shooting pain in his feet.  Please return call to patient to advise.  He can be reached at hm# simvastatin (ZOCOR) 20 MG tablet Today .

## 2011-04-02 NOTE — Telephone Encounter (Signed)
Try pravachol 10mg  Number 30 take one daily at night.  Refills 6

## 2011-04-09 ENCOUNTER — Telehealth: Payer: Self-pay | Admitting: Cardiovascular Disease

## 2011-04-09 NOTE — Telephone Encounter (Signed)
New Problem:    Patient called in wanting to know if his pravastatin (PRAVACHOL) 10 MG tablet was at the proper mg dose. Please call back.

## 2011-04-09 NOTE — Telephone Encounter (Signed)
PT AWARE  PRAVASTATIN 10 MG IS CORRECT  DOSE PER DR NISHAN'S MESSAGE IN PREVIOUS TELEPHONE NOTE .Derek Foster

## 2011-05-28 ENCOUNTER — Other Ambulatory Visit: Payer: Self-pay | Admitting: Cardiovascular Disease

## 2011-05-29 LAB — HEPATIC FUNCTION PANEL
ALT: 14 U/L (ref 0–53)
AST: 15 U/L (ref 0–37)
Albumin: 4.1 g/dL (ref 3.5–5.2)
Alkaline Phosphatase: 68 U/L (ref 39–117)
Bilirubin, Direct: 0.1 mg/dL (ref 0.0–0.3)
Indirect Bilirubin: 0.4 mg/dL (ref 0.0–0.9)
Total Bilirubin: 0.5 mg/dL (ref 0.3–1.2)
Total Protein: 6.5 g/dL (ref 6.0–8.3)

## 2011-05-29 LAB — LIPID PANEL
Cholesterol: 138 mg/dL (ref 0–200)
HDL: 43 mg/dL (ref >39)
LDL Cholesterol: 79 mg/dL (ref 0–99)
Total CHOL/HDL Ratio: 3.2 Ratio
Triglycerides: 81 mg/dL (ref <150)
VLDL: 16 mg/dL (ref 0–40)

## 2011-06-19 ENCOUNTER — Telehealth: Payer: Self-pay | Admitting: Cardiovascular Disease

## 2011-06-19 MED ORDER — PRAVASTATIN SODIUM 10 MG PO TABS: 10.0000 mg | ORAL_TABLET | Freq: Every evening | ORAL | Status: DC

## 2011-06-19 NOTE — Telephone Encounter (Signed)
Refill- Pravastatin  Patient called to say he is doing ok with Pravastatin med and would like to reorder through [facility].

## 2011-07-03 ENCOUNTER — Other Ambulatory Visit (HOSPITAL_COMMUNITY): Payer: Self-pay | Admitting: Internal Medicine

## 2011-07-03 DIAGNOSIS — R42 Dizziness and giddiness: Secondary | ICD-10-CM

## 2011-07-04 ENCOUNTER — Other Ambulatory Visit (HOSPITAL_COMMUNITY): Payer: Self-pay | Admitting: Internal Medicine

## 2011-07-04 ENCOUNTER — Ambulatory Visit (HOSPITAL_COMMUNITY)
Admission: RE | Admit: 2011-07-04 | Discharge: 2011-07-04 | Disposition: A | Discharge status: Home / Self Care | Payer: Medicare Other | Source: Ambulatory Visit | Attending: Internal Medicine | Admitting: Internal Medicine

## 2011-07-04 DIAGNOSIS — R42 Dizziness and giddiness: Secondary | ICD-10-CM

## 2011-07-04 DIAGNOSIS — I672 Cerebral atherosclerosis: Secondary | ICD-10-CM | POA: Insufficient documentation

## 2011-07-07 ENCOUNTER — Ambulatory Visit (HOSPITAL_COMMUNITY)
Admission: RE | Admit: 2011-07-07 | Discharge: 2011-07-07 | Disposition: A | Discharge status: Home / Self Care | Payer: Medicare Other | Source: Ambulatory Visit | Attending: Internal Medicine | Admitting: Internal Medicine

## 2011-07-07 DIAGNOSIS — R55 Syncope and collapse: Secondary | ICD-10-CM | POA: Insufficient documentation

## 2011-07-07 DIAGNOSIS — R42 Dizziness and giddiness: Secondary | ICD-10-CM | POA: Insufficient documentation

## 2011-07-07 DIAGNOSIS — I10 Essential (primary) hypertension: Secondary | ICD-10-CM | POA: Insufficient documentation

## 2011-07-07 DIAGNOSIS — Z951 Presence of aortocoronary bypass graft: Secondary | ICD-10-CM | POA: Insufficient documentation

## 2011-09-01 ENCOUNTER — Telehealth: Payer: Self-pay | Admitting: Cardiovascular Disease

## 2011-09-01 MED ORDER — PRAVASTATIN SODIUM 10 MG PO TABS: 10.0000 mg | ORAL_TABLET | Freq: Every evening | ORAL | Status: DC

## 2011-09-01 NOTE — Telephone Encounter (Signed)
Please return call to patient [phone] regarding ROV and Refill, does he need to come in to get RX updated

## 2011-09-01 NOTE — Telephone Encounter (Signed)
Cholesterol medication sent to [facility] as requested

## 2011-09-15 ENCOUNTER — Telehealth: Payer: Self-pay | Admitting: Cardiovascular Disease

## 2011-09-15 NOTE — Telephone Encounter (Signed)
Patient stated he needed labs, mailed a copy

## 2011-09-15 NOTE — Telephone Encounter (Signed)
Pt needs BP readings called to him for an insurance form

## 2011-09-23 ENCOUNTER — Telehealth: Payer: Self-pay | Admitting: Cardiovascular Disease

## 2011-09-23 NOTE — Telephone Encounter (Signed)
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 WILL BE IN THE AREA FRIDAY

## 2011-09-23 NOTE — Telephone Encounter (Signed)
Plavix is not managed by using PT/INR.  Will send to Dr. Eden Emms for orders.

## 2011-09-23 NOTE — Telephone Encounter (Signed)
Can order a P2Y for Plavix no need for INR/PT and lipids/liver just checked

## 2011-09-24 NOTE — Telephone Encounter (Signed)
PT AWARE  LAB ORDER LEFT AT FRONT DESK FOR PICK UP   WILL  HAVE LAB DONE AT [facility] [address]./CY

## 2011-09-26 ENCOUNTER — Other Ambulatory Visit: Payer: Self-pay | Admitting: Cardiovascular Disease

## 2011-09-26 LAB — PLATELET INHIBITION P2Y12: Platelet Function  P2Y12: 244 [PRU] (ref 194–418)

## 2011-09-30 ENCOUNTER — Telehealth: Payer: Self-pay | Admitting: Cardiovascular Disease

## 2011-09-30 MED ORDER — PRASUGREL HCL 10 MG PO TABS: 10.0000 mg | ORAL_TABLET | Freq: Every day | ORAL | Status: DC

## 2011-09-30 NOTE — Telephone Encounter (Signed)
SEE LAB  RESULT NOTE PT AWARE OF LAB RESULTS .Zack Seal

## 2011-09-30 NOTE — Telephone Encounter (Signed)
PER PT  EFFIENT WILL RUN  APPROX $200.0- A MONTH PT STATES CAN MAYBE  BUY A COUPLE  OF MONTHS BUT CANNOT AFFORD LONG TERM  PT AWARE WILL FORWARD TO DR Eden Emms FOR REVIEW .Zack Seal

## 2011-09-30 NOTE — Telephone Encounter (Signed)
Take it as long as he can and come to office for samples  Next 3 months would be good

## 2011-09-30 NOTE — Telephone Encounter (Signed)
Pt rtning a call but he doesn't know who called or why

## 2011-09-30 NOTE — Telephone Encounter (Signed)
Patient returning nurse call, he can be reached at hm# °

## 2011-09-30 NOTE — Telephone Encounter (Signed)
SCRIPT SENT TO [facility]  WITH  3 REFILLS  INFORMED PT TO CALL IN COUPLE OF WEEKS TO CHECK ON SAMPLESPT AGREES./CY

## 2012-02-05 ENCOUNTER — Telehealth: Payer: Self-pay | Admitting: Cardiovascular Disease

## 2012-02-05 NOTE — Telephone Encounter (Signed)
plavix was changed to Effient 10mg  and it causes his nose to bleed should he keep taking it cause sometimes it is severe

## 2012-02-05 NOTE — Telephone Encounter (Signed)
Pt is not using anything for dry nasal membranes, told pt to get nasal saline spray and use morning and night, may try nasal gel if dryness more severe. Told him to try this and call with further questions or concerns and he agreed to plan.

## 2012-02-20 ENCOUNTER — Other Ambulatory Visit: Payer: Self-pay | Admitting: *Deleted

## 2012-02-20 MED ORDER — PRASUGREL HCL 10 MG PO TABS: 10.0000 mg | ORAL_TABLET | Freq: Every day | ORAL | Status: DC

## 2012-03-30 ENCOUNTER — Ambulatory Visit: Payer: Medicare Other | Admitting: Cardiovascular Disease

## 2012-05-10 ENCOUNTER — Other Ambulatory Visit: Payer: Self-pay | Admitting: *Deleted

## 2012-05-10 ENCOUNTER — Encounter: Payer: Self-pay | Admitting: *Deleted

## 2012-05-10 MED ORDER — PRAVASTATIN SODIUM 10 MG PO TABS: 10.0000 mg | ORAL_TABLET | Freq: Every evening | ORAL | Status: DC

## 2012-05-10 NOTE — Telephone Encounter (Signed)
Fax Received. Refill Completed. Lissett Favorite Chowoe (R.M.A)   

## 2012-05-11 ENCOUNTER — Other Ambulatory Visit: Payer: Self-pay | Admitting: *Deleted

## 2012-05-11 ENCOUNTER — Telehealth: Payer: Self-pay | Admitting: Cardiovascular Disease

## 2012-05-11 MED ORDER — PRAVASTATIN SODIUM 10 MG PO TABS: 10.0000 mg | ORAL_TABLET | Freq: Every evening | ORAL | Status: DC

## 2012-05-11 NOTE — Telephone Encounter (Deleted)
error 

## 2012-05-11 NOTE — Telephone Encounter (Signed)
pt called to ck status of hie refill of pravastatin.advised pt it wasrefilled today

## 2012-05-21 ENCOUNTER — Ambulatory Visit: Payer: Medicare Other | Admitting: Cardiovascular Disease

## 2012-06-16 ENCOUNTER — Ambulatory Visit (INDEPENDENT_AMBULATORY_CARE_PROVIDER_SITE_OTHER): Payer: Medicare Other | Admitting: Cardiovascular Disease

## 2012-06-16 ENCOUNTER — Encounter: Payer: Self-pay | Admitting: Cardiovascular Disease

## 2012-06-16 VITALS — BP 139/78 | HR 63 | Ht 72.0 in | Wt 282.0 lb

## 2012-06-16 DIAGNOSIS — I10 Essential (primary) hypertension: Secondary | ICD-10-CM

## 2012-06-16 DIAGNOSIS — I219 Acute myocardial infarction, unspecified: Secondary | ICD-10-CM

## 2012-06-16 DIAGNOSIS — E782 Mixed hyperlipidemia: Secondary | ICD-10-CM

## 2012-06-16 MED ORDER — NITROGLYCERIN 0.4 MG SL SUBL: 0.4000 mg | SUBLINGUAL_TABLET | SUBLINGUAL | Status: DC | PRN

## 2012-06-16 NOTE — Patient Instructions (Addendum)
**Note De-identified Derek Foster Obfuscation** Your physician recommends that you continue on your current medications as directed. Please refer to the Current Medication list given to you today.  Your physician wants you to follow-up in: 1 year. You will receive a reminder letter in the mail two months in advance. If you don't receive a letter, please call our office to schedule the follow-up appointment.  

## 2012-06-16 NOTE — Assessment & Plan Note (Signed)
Well controlled.  Continue current medications and low sodium Dash type diet.    

## 2012-06-16 NOTE — Assessment & Plan Note (Addendum)
Stable with no angina and good activity level.  Continue medical Rx D/C Effient

## 2012-06-16 NOTE — Assessment & Plan Note (Signed)
Cholesterol is at goal.  Continue current dose of statin and diet Rx.  No myalgias or side effects.  F/U  LFT's in 6 months. Lab Results  Component Value Date   LDLCALC 79 05/28/2011

## 2012-06-16 NOTE — Progress Notes (Signed)
Patient ID: Derek Foster, male   DOB: 25-Jun-1935, 77 y.o.   MRN: [id number] Derek Foster is seen today post hospital D/C for anterior MI with stent to the LAD 1/21 with Dr Tresa Endo. He had moderate residual RCA disease. He has done well. He is ambulating without angina. Myovue on 2/22 showed no ischemia or infarction with EF 66%. He has had some easy bruising on ASA/Plavix. Dr Renette Butters suggested Vit C but I'm not sure this will help/ He wife has had problemx post back surgery with falls and a broken foot. This has kept him from exercising and going to the [facility]. He denies SSCP, palpitations, diaphoresis, dyspnea. He has chronic mild LE edema due to varicosities  P2Y checked 9/13 and was high so on Effient  He has nose bleeds with this Stent was 1/11 so effient can be stopped  PSA up to 6 seeing Dr Patsi Sears  May need biopsy  ROS: Denies fever, malais, weight loss, blurry vision, decreased visual acuity, cough, sputum, SOB, hemoptysis, pleuritic pain, palpitaitons, heartburn, abdominal pain, melena, lower extremity edema, claudication, or rash.  All other systems reviewed and negative  General: Affect appropriate Obese white male  HEENT: normal Neck supple with no adenopathy JVP normal no bruits no thyromegaly Lungs clear with no wheezing and good diaphragmatic motion Heart:  S1/S2 no murmur, no rub, gallop or click PMI normal Abdomen: benighn, BS positve, no tenderness, no AAA Previous surgery with large ventral hernia no bruit.  No HSM or HJR Distal pulses intact with no bruits Plus one bilateral  edema Neuro non-focal Skin warm and dry No muscular weakness   Current Outpatient Prescriptions  Medication Sig Dispense Refill  . amLODipine-olmesartan (AZOR) 5-20 MG per tablet Take 1 tablet by mouth daily.      . Ascorbic Acid (VITAMIN C) 500 MG tablet Take 500 mg by mouth daily.        Marland Kitchen aspirin 81 MG tablet Take 81 mg by mouth daily.        . bisoprolol-hydrochlorothiazide (ZIAC) 5-6.25 MG per  tablet Take 1 tablet by mouth daily.        . Coenzyme Q10 200 MG capsule Take 200 mg by mouth daily.        . fish oil-omega-3 fatty acids 1000 MG capsule Take 2 g by mouth daily.        . lansoprazole (PREVACID SOLUTAB) 30 MG disintegrating tablet Take 30 mg by mouth daily.        . Multiple Vitamin (MULTIVITAMIN) tablet Take 1 tablet by mouth daily.        . nitroGLYCERIN (NITROSTAT) 0.4 MG SL tablet Place 0.4 mg under the tongue every 5 (five) minutes as needed.        . prasugrel (EFFIENT) 10 MG TABS Take 1 tablet (10 mg total) by mouth daily.  30 tablet  12  . pravastatin (PRAVACHOL) 10 MG tablet Take 1 tablet (10 mg total) by mouth every evening.  90 tablet  2  . [DISCONTINUED] simvastatin (ZOCOR) 20 MG tablet Take 1 tablet (20 mg total) by mouth every evening.  30 tablet  11   No current facility-administered medications for this visit.    Allergies  Review of patient's allergies indicates no known allergies.  Electrocardiogram:  SR rate 63 normal ECG      Assessment and Plan

## 2012-11-08 ENCOUNTER — Other Ambulatory Visit (INDEPENDENT_AMBULATORY_CARE_PROVIDER_SITE_OTHER): Payer: Self-pay | Admitting: *Deleted

## 2012-11-08 ENCOUNTER — Telehealth (INDEPENDENT_AMBULATORY_CARE_PROVIDER_SITE_OTHER): Payer: Self-pay | Admitting: *Deleted

## 2012-11-08 ENCOUNTER — Encounter (INDEPENDENT_AMBULATORY_CARE_PROVIDER_SITE_OTHER): Payer: Self-pay

## 2012-11-08 DIAGNOSIS — Z1211 Encounter for screening for malignant neoplasm of colon: Secondary | ICD-10-CM

## 2012-11-08 DIAGNOSIS — Z86010 Personal history of colonic polyps: Secondary | ICD-10-CM

## 2012-11-08 MED ORDER — PEG-KCL-NACL-NASULF-NA ASC-C 100 G PO SOLR: 1.0000 | Freq: Once | ORAL | Status: DC

## 2012-11-08 NOTE — Telephone Encounter (Signed)
Patient needs movi prep 

## 2012-12-02 ENCOUNTER — Other Ambulatory Visit: Payer: Self-pay

## 2012-12-02 MED ORDER — PRAVASTATIN SODIUM 10 MG PO TABS: 10.0000 mg | ORAL_TABLET | Freq: Every evening | ORAL | Status: DC

## 2013-01-19 ENCOUNTER — Telehealth (INDEPENDENT_AMBULATORY_CARE_PROVIDER_SITE_OTHER): Payer: Self-pay | Admitting: *Deleted

## 2013-01-19 NOTE — Telephone Encounter (Signed)
  Procedure: tcs  Reason/Indication:  Hx polyps  Has patient had this procedure before?  Yes, 2009 -- scanned  If so, when, by whom and where?    Is there a family history of colon cancer?  no  Who?  What age when diagnosed?    Is patient diabetic?   no      Does patient have prosthetic heart valve?  no  Do you have a pacemaker?  no  Has patient ever had endocarditis? no  Has patient had joint replacement within last 12 months?  no  Does patient tend to be constipated or take laxatives? no  Is patient on Coumadin, Plavix and/or Aspirin? yes  Medications: asa 325 mg daily, bisoprolol 5.625 mg daily, azor 5/20 mg daily, lansoprazole 30 mg daily, omega red daily, co q 10 daily  Allergies: indocin, ivp dye  Medication Adjustment: asa 2 days  Procedure date & time: 02/16/13 at 830

## 2013-01-19 NOTE — Telephone Encounter (Signed)
agree

## 2013-02-16 ENCOUNTER — Encounter (HOSPITAL_COMMUNITY): Admission: RE | Payer: Self-pay | Source: Ambulatory Visit

## 2013-02-16 ENCOUNTER — Ambulatory Visit (HOSPITAL_COMMUNITY): Admission: RE | Admit: 2013-02-16 | Payer: Medicare Other | Source: Ambulatory Visit | Admitting: Internal Medicine

## 2013-02-16 SURGERY — COLONOSCOPY
Anesthesia: Moderate Sedation

## 2013-06-15 ENCOUNTER — Other Ambulatory Visit: Payer: Self-pay | Admitting: *Deleted

## 2013-06-15 MED ORDER — PRAVASTATIN SODIUM 10 MG PO TABS: 10.0000 mg | ORAL_TABLET | Freq: Every evening | ORAL | Status: DC

## 2013-07-28 ENCOUNTER — Other Ambulatory Visit: Payer: Self-pay

## 2013-07-28 MED ORDER — PRAVASTATIN SODIUM 10 MG PO TABS: 10.0000 mg | ORAL_TABLET | Freq: Every evening | ORAL | Status: DC

## 2013-09-20 ENCOUNTER — Other Ambulatory Visit: Payer: Self-pay

## 2013-09-20 MED ORDER — PRAVASTATIN SODIUM 10 MG PO TABS: 10.0000 mg | ORAL_TABLET | Freq: Every evening | ORAL | Status: DC

## 2014-06-15 ENCOUNTER — Other Ambulatory Visit (HOSPITAL_COMMUNITY): Payer: Self-pay | Admitting: Internal Medicine

## 2014-06-15 DIAGNOSIS — E042 Nontoxic multinodular goiter: Secondary | ICD-10-CM

## 2014-06-21 ENCOUNTER — Other Ambulatory Visit (HOSPITAL_COMMUNITY): Payer: Self-pay

## 2014-06-23 ENCOUNTER — Ambulatory Visit (HOSPITAL_COMMUNITY)
Admission: RE | Admit: 2014-06-23 | Discharge: 2014-06-23 | Disposition: A | Discharge status: Home / Self Care | Payer: PPO | Source: Ambulatory Visit | Attending: Internal Medicine | Admitting: Internal Medicine

## 2014-06-23 DIAGNOSIS — E042 Nontoxic multinodular goiter: Secondary | ICD-10-CM | POA: Insufficient documentation

## 2014-08-09 ENCOUNTER — Other Ambulatory Visit (HOSPITAL_COMMUNITY): Payer: Self-pay | Admitting: "Endocrinology

## 2014-08-09 DIAGNOSIS — E041 Nontoxic single thyroid nodule: Secondary | ICD-10-CM

## 2014-08-23 ENCOUNTER — Ambulatory Visit (HOSPITAL_COMMUNITY): Admission: RE | Admit: 2014-08-23 | Payer: PPO | Source: Ambulatory Visit

## 2014-08-31 ENCOUNTER — Other Ambulatory Visit (HOSPITAL_COMMUNITY): Payer: PPO

## 2014-09-06 ENCOUNTER — Other Ambulatory Visit (HOSPITAL_COMMUNITY): Payer: Self-pay | Admitting: "Endocrinology

## 2014-09-06 ENCOUNTER — Ambulatory Visit (HOSPITAL_COMMUNITY)
Admission: RE | Admit: 2014-09-06 | Discharge: 2014-09-06 | Disposition: A | Discharge status: Home / Self Care | Payer: PPO | Source: Ambulatory Visit | Attending: "Endocrinology | Admitting: "Endocrinology

## 2014-09-06 DIAGNOSIS — E042 Nontoxic multinodular goiter: Secondary | ICD-10-CM | POA: Insufficient documentation

## 2014-09-06 DIAGNOSIS — E041 Nontoxic single thyroid nodule: Secondary | ICD-10-CM

## 2014-09-06 MED ORDER — LIDOCAINE HCL (PF) 2 % IJ SOLN
INTRAMUSCULAR | Status: AC
  Filled 2014-09-06: qty 10

## 2014-09-06 NOTE — Discharge Instructions (Signed)
Thyroid Biopsy °The thyroid gland is a butterfly-shaped gland situated in the front of the neck. It produces hormones which affect metabolism, growth and development, and body temperature. A thyroid biopsy is a procedure in which small samples of tissue or fluid are removed from the thyroid gland or mass and examined under a microscope. This test is done to determine the cause of thyroid problems, such as infection, cancer, or other thyroid problems. °There are 2 ways to obtain samples: °1. Fine needle biopsy. Samples are removed using a thin needle inserted through the skin and into the thyroid gland or mass. °2. Open biopsy. Samples are removed after a cut (incision) is made through the skin. °LET YOUR CAREGIVER KNOW ABOUT:  °· Allergies. °· Medications taken including herbs, eye drops, over-the-counter medications, and creams. °· Use of steroids (by mouth or creams). °· Previous problems with anesthetics or numbing medicine. °· Possibility of pregnancy, if this applies. °· History of blood clots (thrombophlebitis). °· History of bleeding or blood problems. °· Previous surgery. °· Other health problems. °RISKS AND COMPLICATIONS °· Bleeding from the site. The risk of bleeding is higher if you have a bleeding disorder or are taking any blood thinning medications (anticoagulants). °· Infection. °· Injury to structures near the thyroid gland. °BEFORE THE PROCEDURE  °This is a procedure that can be done as an outpatient. Confirm the time that you need to arrive for your procedure. Confirm whether there is a need to fast or withhold any medications. A blood sample may be done to determine your blood clotting time. Medicine may be given to help you relax (sedative). °PROCEDURE °Fine needle biopsy. °You will be awake during the procedure. You may be asked to lie on your back with your head tipped backward to extend your neck. Let your caregiver know if you cannot tolerate the positioning. An area on your neck will be  cleansed. A needle is inserted through the skin of your neck. You may feel a mild discomfort during this procedure. You may be asked to avoid coughing, talking, swallowing, or making sounds during some portions of the procedure. The needle is withdrawn once tissue or fluid samples have been removed. Pressure may be applied to the neck to reduce swelling and ensure that bleeding has stopped. The samples will be sent for examination.  °Open biopsy. °You will be given general anesthesia. You will be asleep during the procedure. An incision is made in your neck. A sample of thyroid tissue or the mass is removed. The tissue sample or mass will be sent for examination. The sample or mass may be examined during the biopsy. If the sample or mass contains cancer cells, some or all of the thyroid gland may be removed. The incision is closed with stitches. °AFTER THE PROCEDURE  °Your recovery will be assessed and monitored. If there are no problems, as an outpatient, you should be able to go home shortly after the procedure. °If you had a fine needle biopsy: °· You may have soreness at the biopsy site for 1 to 2 days. °If you had an open biopsy:  °· You may have soreness at the biopsy site for 3 to 4 days. °· You may have a hoarse voice or sore throat for 1 to 2 days. °Obtaining the Test Results °It is your responsibility to obtain your test results. Do not assume everything is normal if you have not heard from your caregiver or the medical facility. It is important for you to follow up   on all of your test results. °HOME CARE INSTRUCTIONS  °· Keeping your head raised on a pillow when you are lying down may ease biopsy site discomfort. °· Supporting the back of your head and neck with both hands as you sit up from a lying position may ease biopsy site discomfort. °· Only take over-the-counter or prescription medicines for pain, discomfort, or fever as directed by your caregiver. °· Throat lozenges or gargling with warm salt  water may help to soothe a sore throat. °SEEK IMMEDIATE MEDICAL CARE IF:  °· You have severe bleeding from the biopsy site. °· You have difficulty swallowing. °· You have a fever. °· You have increased pain, swelling, redness, or warmth at the biopsy site. °· You notice pus coming from the biopsy site. °· You have swollen glands (lymph nodes) in your neck. °Document Released: 11/03/2006 Document Revised: 05/03/2012 Document Reviewed: 03/31/2013 °[employer]® Patient Information ©2015 [employer]. This information is not intended to replace advice given to you by your health care provider. Make sure you discuss any questions you have with your health care provider. ° °

## 2015-02-07 DIAGNOSIS — I10 Essential (primary) hypertension: Secondary | ICD-10-CM | POA: Diagnosis not present

## 2015-02-07 DIAGNOSIS — E039 Hypothyroidism, unspecified: Secondary | ICD-10-CM | POA: Diagnosis not present

## 2015-02-07 DIAGNOSIS — R7301 Impaired fasting glucose: Secondary | ICD-10-CM | POA: Diagnosis not present

## 2015-02-07 DIAGNOSIS — Z125 Encounter for screening for malignant neoplasm of prostate: Secondary | ICD-10-CM | POA: Diagnosis not present

## 2015-02-16 DIAGNOSIS — L851 Acquired keratosis [keratoderma] palmaris et plantaris: Secondary | ICD-10-CM | POA: Diagnosis not present

## 2015-02-16 DIAGNOSIS — M7742 Metatarsalgia, left foot: Secondary | ICD-10-CM | POA: Diagnosis not present

## 2015-02-16 DIAGNOSIS — B351 Tinea unguium: Secondary | ICD-10-CM | POA: Diagnosis not present

## 2015-02-21 DIAGNOSIS — Z6839 Body mass index (BMI) 39.0-39.9, adult: Secondary | ICD-10-CM | POA: Diagnosis not present

## 2015-02-21 DIAGNOSIS — E0590 Thyrotoxicosis, unspecified without thyrotoxic crisis or storm: Secondary | ICD-10-CM | POA: Diagnosis not present

## 2015-02-21 DIAGNOSIS — E782 Mixed hyperlipidemia: Secondary | ICD-10-CM | POA: Diagnosis not present

## 2015-02-21 DIAGNOSIS — H8110 Benign paroxysmal vertigo, unspecified ear: Secondary | ICD-10-CM | POA: Diagnosis not present

## 2015-02-21 DIAGNOSIS — N400 Enlarged prostate without lower urinary tract symptoms: Secondary | ICD-10-CM | POA: Diagnosis not present

## 2015-02-21 DIAGNOSIS — E6609 Other obesity due to excess calories: Secondary | ICD-10-CM | POA: Diagnosis not present

## 2015-02-21 DIAGNOSIS — I10 Essential (primary) hypertension: Secondary | ICD-10-CM | POA: Diagnosis not present

## 2015-03-08 ENCOUNTER — Other Ambulatory Visit: Payer: Self-pay | Admitting: Surgery

## 2015-03-08 DIAGNOSIS — E042 Nontoxic multinodular goiter: Secondary | ICD-10-CM

## 2015-03-15 ENCOUNTER — Ambulatory Visit
Admission: RE | Admit: 2015-03-15 | Discharge: 2015-03-15 | Disposition: A | Discharge status: Home / Self Care | Payer: PPO | Source: Ambulatory Visit | Attending: Surgery | Admitting: Surgery

## 2015-03-15 DIAGNOSIS — E042 Nontoxic multinodular goiter: Secondary | ICD-10-CM | POA: Diagnosis not present

## 2015-04-27 DIAGNOSIS — M7742 Metatarsalgia, left foot: Secondary | ICD-10-CM | POA: Diagnosis not present

## 2015-04-27 DIAGNOSIS — L851 Acquired keratosis [keratoderma] palmaris et plantaris: Secondary | ICD-10-CM | POA: Diagnosis not present

## 2015-04-27 DIAGNOSIS — B351 Tinea unguium: Secondary | ICD-10-CM | POA: Diagnosis not present

## 2015-05-03 DIAGNOSIS — E042 Nontoxic multinodular goiter: Secondary | ICD-10-CM | POA: Diagnosis not present

## 2015-07-13 DIAGNOSIS — M7742 Metatarsalgia, left foot: Secondary | ICD-10-CM | POA: Diagnosis not present

## 2015-07-13 DIAGNOSIS — B351 Tinea unguium: Secondary | ICD-10-CM | POA: Diagnosis not present

## 2015-07-13 DIAGNOSIS — L851 Acquired keratosis [keratoderma] palmaris et plantaris: Secondary | ICD-10-CM | POA: Diagnosis not present

## 2015-08-22 DIAGNOSIS — R7301 Impaired fasting glucose: Secondary | ICD-10-CM | POA: Diagnosis not present

## 2015-08-22 DIAGNOSIS — E782 Mixed hyperlipidemia: Secondary | ICD-10-CM | POA: Diagnosis not present

## 2015-08-24 DIAGNOSIS — E048 Other specified nontoxic goiter: Secondary | ICD-10-CM | POA: Diagnosis not present

## 2015-08-24 DIAGNOSIS — E782 Mixed hyperlipidemia: Secondary | ICD-10-CM | POA: Diagnosis not present

## 2015-08-24 DIAGNOSIS — I10 Essential (primary) hypertension: Secondary | ICD-10-CM | POA: Diagnosis not present

## 2015-08-24 DIAGNOSIS — N400 Enlarged prostate without lower urinary tract symptoms: Secondary | ICD-10-CM | POA: Diagnosis not present

## 2015-09-28 DIAGNOSIS — M2580 Other specified joint disorders, unspecified joint: Secondary | ICD-10-CM | POA: Diagnosis not present

## 2015-09-28 DIAGNOSIS — M7741 Metatarsalgia, right foot: Secondary | ICD-10-CM | POA: Diagnosis not present

## 2015-09-28 DIAGNOSIS — M79671 Pain in right foot: Secondary | ICD-10-CM | POA: Diagnosis not present

## 2015-09-28 DIAGNOSIS — L851 Acquired keratosis [keratoderma] palmaris et plantaris: Secondary | ICD-10-CM | POA: Diagnosis not present

## 2015-10-24 DIAGNOSIS — Z23 Encounter for immunization: Secondary | ICD-10-CM | POA: Diagnosis not present

## 2015-12-07 DIAGNOSIS — M2580 Other specified joint disorders, unspecified joint: Secondary | ICD-10-CM | POA: Diagnosis not present

## 2015-12-07 DIAGNOSIS — L851 Acquired keratosis [keratoderma] palmaris et plantaris: Secondary | ICD-10-CM | POA: Diagnosis not present

## 2015-12-07 DIAGNOSIS — M79671 Pain in right foot: Secondary | ICD-10-CM | POA: Diagnosis not present

## 2015-12-07 DIAGNOSIS — M7741 Metatarsalgia, right foot: Secondary | ICD-10-CM | POA: Diagnosis not present

## 2015-12-31 DIAGNOSIS — Z0000 Encounter for general adult medical examination without abnormal findings: Secondary | ICD-10-CM | POA: Diagnosis not present

## 2016-02-13 DIAGNOSIS — H31091 Other chorioretinal scars, right eye: Secondary | ICD-10-CM | POA: Diagnosis not present

## 2016-02-13 DIAGNOSIS — H3581 Retinal edema: Secondary | ICD-10-CM | POA: Diagnosis not present

## 2016-02-13 DIAGNOSIS — H35371 Puckering of macula, right eye: Secondary | ICD-10-CM | POA: Diagnosis not present

## 2016-02-19 DIAGNOSIS — L851 Acquired keratosis [keratoderma] palmaris et plantaris: Secondary | ICD-10-CM | POA: Diagnosis not present

## 2016-02-19 DIAGNOSIS — M2580 Other specified joint disorders, unspecified joint: Secondary | ICD-10-CM | POA: Diagnosis not present

## 2016-02-19 DIAGNOSIS — M7741 Metatarsalgia, right foot: Secondary | ICD-10-CM | POA: Diagnosis not present

## 2016-02-19 DIAGNOSIS — M79671 Pain in right foot: Secondary | ICD-10-CM | POA: Diagnosis not present

## 2016-03-24 DIAGNOSIS — I10 Essential (primary) hypertension: Secondary | ICD-10-CM | POA: Diagnosis not present

## 2016-03-24 DIAGNOSIS — E0590 Thyrotoxicosis, unspecified without thyrotoxic crisis or storm: Secondary | ICD-10-CM | POA: Diagnosis not present

## 2016-03-24 DIAGNOSIS — Z125 Encounter for screening for malignant neoplasm of prostate: Secondary | ICD-10-CM | POA: Diagnosis not present

## 2016-04-17 ENCOUNTER — Other Ambulatory Visit: Payer: Self-pay | Admitting: Surgery

## 2016-04-17 DIAGNOSIS — E041 Nontoxic single thyroid nodule: Secondary | ICD-10-CM

## 2016-04-21 ENCOUNTER — Ambulatory Visit
Admission: RE | Admit: 2016-04-21 | Discharge: 2016-04-21 | Disposition: A | Discharge status: Home / Self Care | Payer: PPO | Source: Ambulatory Visit | Attending: Surgery | Admitting: Surgery

## 2016-04-21 DIAGNOSIS — E042 Nontoxic multinodular goiter: Secondary | ICD-10-CM | POA: Diagnosis not present

## 2016-04-21 DIAGNOSIS — E041 Nontoxic single thyroid nodule: Secondary | ICD-10-CM

## 2016-04-29 DIAGNOSIS — L309 Dermatitis, unspecified: Secondary | ICD-10-CM | POA: Diagnosis not present

## 2016-04-29 DIAGNOSIS — M2580 Other specified joint disorders, unspecified joint: Secondary | ICD-10-CM | POA: Diagnosis not present

## 2016-04-29 DIAGNOSIS — M79671 Pain in right foot: Secondary | ICD-10-CM | POA: Diagnosis not present

## 2016-04-29 DIAGNOSIS — M7741 Metatarsalgia, right foot: Secondary | ICD-10-CM | POA: Diagnosis not present

## 2016-05-13 DIAGNOSIS — E042 Nontoxic multinodular goiter: Secondary | ICD-10-CM | POA: Diagnosis not present

## 2016-05-16 ENCOUNTER — Other Ambulatory Visit: Payer: Self-pay | Admitting: Surgery

## 2016-05-16 DIAGNOSIS — E042 Nontoxic multinodular goiter: Secondary | ICD-10-CM

## 2016-06-03 ENCOUNTER — Ambulatory Visit
Admission: RE | Admit: 2016-06-03 | Discharge: 2016-06-03 | Disposition: A | Discharge status: Home / Self Care | Payer: PPO | Source: Ambulatory Visit | Attending: Surgery | Admitting: Surgery

## 2016-06-03 ENCOUNTER — Other Ambulatory Visit (HOSPITAL_COMMUNITY)
Admission: RE | Admit: 2016-06-03 | Discharge: 2016-06-03 | Disposition: A | Discharge status: Home / Self Care | Payer: PPO | Source: Ambulatory Visit | Attending: Student | Admitting: Student

## 2016-06-03 DIAGNOSIS — E041 Nontoxic single thyroid nodule: Secondary | ICD-10-CM | POA: Insufficient documentation

## 2016-06-03 DIAGNOSIS — E042 Nontoxic multinodular goiter: Secondary | ICD-10-CM

## 2016-07-15 DIAGNOSIS — M79671 Pain in right foot: Secondary | ICD-10-CM | POA: Diagnosis not present

## 2016-07-15 DIAGNOSIS — M7741 Metatarsalgia, right foot: Secondary | ICD-10-CM | POA: Diagnosis not present

## 2016-07-15 DIAGNOSIS — L851 Acquired keratosis [keratoderma] palmaris et plantaris: Secondary | ICD-10-CM | POA: Diagnosis not present

## 2016-09-30 DIAGNOSIS — M79671 Pain in right foot: Secondary | ICD-10-CM | POA: Diagnosis not present

## 2016-09-30 DIAGNOSIS — M7741 Metatarsalgia, right foot: Secondary | ICD-10-CM | POA: Diagnosis not present

## 2016-10-06 DIAGNOSIS — Z125 Encounter for screening for malignant neoplasm of prostate: Secondary | ICD-10-CM | POA: Diagnosis not present

## 2016-10-06 DIAGNOSIS — I10 Essential (primary) hypertension: Secondary | ICD-10-CM | POA: Diagnosis not present

## 2016-10-08 DIAGNOSIS — Z23 Encounter for immunization: Secondary | ICD-10-CM | POA: Diagnosis not present

## 2016-10-08 DIAGNOSIS — I10 Essential (primary) hypertension: Secondary | ICD-10-CM | POA: Diagnosis not present

## 2016-10-08 DIAGNOSIS — N400 Enlarged prostate without lower urinary tract symptoms: Secondary | ICD-10-CM | POA: Diagnosis not present

## 2016-10-08 DIAGNOSIS — E039 Hypothyroidism, unspecified: Secondary | ICD-10-CM | POA: Diagnosis not present

## 2016-10-08 DIAGNOSIS — E6609 Other obesity due to excess calories: Secondary | ICD-10-CM | POA: Diagnosis not present

## 2016-10-08 DIAGNOSIS — N401 Enlarged prostate with lower urinary tract symptoms: Secondary | ICD-10-CM | POA: Diagnosis not present

## 2016-10-08 DIAGNOSIS — E785 Hyperlipidemia, unspecified: Secondary | ICD-10-CM | POA: Diagnosis not present

## 2016-10-08 DIAGNOSIS — Z6841 Body Mass Index (BMI) 40.0 and over, adult: Secondary | ICD-10-CM | POA: Diagnosis not present

## 2016-10-22 DIAGNOSIS — H35371 Puckering of macula, right eye: Secondary | ICD-10-CM | POA: Diagnosis not present

## 2016-10-22 DIAGNOSIS — Z961 Presence of intraocular lens: Secondary | ICD-10-CM | POA: Diagnosis not present

## 2016-10-22 DIAGNOSIS — H40013 Open angle with borderline findings, low risk, bilateral: Secondary | ICD-10-CM | POA: Diagnosis not present

## 2016-10-22 DIAGNOSIS — H2512 Age-related nuclear cataract, left eye: Secondary | ICD-10-CM | POA: Diagnosis not present

## 2016-10-22 DIAGNOSIS — H25012 Cortical age-related cataract, left eye: Secondary | ICD-10-CM | POA: Diagnosis not present

## 2017-01-27 DIAGNOSIS — M79671 Pain in right foot: Secondary | ICD-10-CM | POA: Diagnosis not present

## 2017-01-27 DIAGNOSIS — M7741 Metatarsalgia, right foot: Secondary | ICD-10-CM | POA: Diagnosis not present

## 2017-01-27 DIAGNOSIS — L851 Acquired keratosis [keratoderma] palmaris et plantaris: Secondary | ICD-10-CM | POA: Diagnosis not present

## 2017-03-27 DIAGNOSIS — E782 Mixed hyperlipidemia: Secondary | ICD-10-CM | POA: Diagnosis not present

## 2017-03-27 DIAGNOSIS — R972 Elevated prostate specific antigen [PSA]: Secondary | ICD-10-CM | POA: Diagnosis not present

## 2017-03-27 DIAGNOSIS — E0590 Thyrotoxicosis, unspecified without thyrotoxic crisis or storm: Secondary | ICD-10-CM | POA: Diagnosis not present

## 2017-03-27 DIAGNOSIS — I10 Essential (primary) hypertension: Secondary | ICD-10-CM | POA: Diagnosis not present

## 2017-03-27 DIAGNOSIS — E048 Other specified nontoxic goiter: Secondary | ICD-10-CM | POA: Diagnosis not present

## 2017-03-31 DIAGNOSIS — M25561 Pain in right knee: Secondary | ICD-10-CM | POA: Diagnosis not present

## 2017-03-31 DIAGNOSIS — E782 Mixed hyperlipidemia: Secondary | ICD-10-CM | POA: Diagnosis not present

## 2017-03-31 DIAGNOSIS — Z0000 Encounter for general adult medical examination without abnormal findings: Secondary | ICD-10-CM | POA: Diagnosis not present

## 2017-03-31 DIAGNOSIS — N400 Enlarged prostate without lower urinary tract symptoms: Secondary | ICD-10-CM | POA: Diagnosis not present

## 2017-03-31 DIAGNOSIS — I10 Essential (primary) hypertension: Secondary | ICD-10-CM | POA: Diagnosis not present

## 2017-03-31 DIAGNOSIS — E069 Thyroiditis, unspecified: Secondary | ICD-10-CM | POA: Diagnosis not present

## 2017-03-31 DIAGNOSIS — D509 Iron deficiency anemia, unspecified: Secondary | ICD-10-CM | POA: Diagnosis not present

## 2017-04-07 DIAGNOSIS — L851 Acquired keratosis [keratoderma] palmaris et plantaris: Secondary | ICD-10-CM | POA: Diagnosis not present

## 2017-04-07 DIAGNOSIS — M79671 Pain in right foot: Secondary | ICD-10-CM | POA: Diagnosis not present

## 2017-04-07 DIAGNOSIS — M7741 Metatarsalgia, right foot: Secondary | ICD-10-CM | POA: Diagnosis not present

## 2017-05-21 ENCOUNTER — Other Ambulatory Visit: Payer: Self-pay | Admitting: Surgery

## 2017-05-21 DIAGNOSIS — E042 Nontoxic multinodular goiter: Secondary | ICD-10-CM

## 2017-06-23 DIAGNOSIS — M7741 Metatarsalgia, right foot: Secondary | ICD-10-CM | POA: Diagnosis not present

## 2017-06-23 DIAGNOSIS — M79671 Pain in right foot: Secondary | ICD-10-CM | POA: Diagnosis not present

## 2017-06-23 DIAGNOSIS — L851 Acquired keratosis [keratoderma] palmaris et plantaris: Secondary | ICD-10-CM | POA: Diagnosis not present

## 2017-09-01 DIAGNOSIS — M79671 Pain in right foot: Secondary | ICD-10-CM | POA: Diagnosis not present

## 2017-09-01 DIAGNOSIS — L851 Acquired keratosis [keratoderma] palmaris et plantaris: Secondary | ICD-10-CM | POA: Diagnosis not present

## 2017-09-01 DIAGNOSIS — M7741 Metatarsalgia, right foot: Secondary | ICD-10-CM | POA: Diagnosis not present

## 2017-09-28 DIAGNOSIS — D509 Iron deficiency anemia, unspecified: Secondary | ICD-10-CM | POA: Diagnosis not present

## 2017-09-28 DIAGNOSIS — M25561 Pain in right knee: Secondary | ICD-10-CM | POA: Diagnosis not present

## 2017-09-28 DIAGNOSIS — I10 Essential (primary) hypertension: Secondary | ICD-10-CM | POA: Diagnosis not present

## 2017-09-28 DIAGNOSIS — E069 Thyroiditis, unspecified: Secondary | ICD-10-CM | POA: Diagnosis not present

## 2017-09-28 DIAGNOSIS — N400 Enlarged prostate without lower urinary tract symptoms: Secondary | ICD-10-CM | POA: Diagnosis not present

## 2017-09-28 DIAGNOSIS — E0590 Thyrotoxicosis, unspecified without thyrotoxic crisis or storm: Secondary | ICD-10-CM | POA: Diagnosis not present

## 2017-09-28 DIAGNOSIS — E782 Mixed hyperlipidemia: Secondary | ICD-10-CM | POA: Diagnosis not present

## 2017-09-28 DIAGNOSIS — Z0000 Encounter for general adult medical examination without abnormal findings: Secondary | ICD-10-CM | POA: Diagnosis not present

## 2017-09-28 DIAGNOSIS — R972 Elevated prostate specific antigen [PSA]: Secondary | ICD-10-CM | POA: Diagnosis not present

## 2017-10-02 DIAGNOSIS — D508 Other iron deficiency anemias: Secondary | ICD-10-CM | POA: Diagnosis not present

## 2017-10-02 DIAGNOSIS — Z6841 Body Mass Index (BMI) 40.0 and over, adult: Secondary | ICD-10-CM | POA: Diagnosis not present

## 2017-10-02 DIAGNOSIS — N400 Enlarged prostate without lower urinary tract symptoms: Secondary | ICD-10-CM | POA: Diagnosis not present

## 2017-10-02 DIAGNOSIS — I10 Essential (primary) hypertension: Secondary | ICD-10-CM | POA: Diagnosis not present

## 2017-10-02 DIAGNOSIS — R972 Elevated prostate specific antigen [PSA]: Secondary | ICD-10-CM | POA: Diagnosis not present

## 2017-10-02 DIAGNOSIS — E782 Mixed hyperlipidemia: Secondary | ICD-10-CM | POA: Diagnosis not present

## 2017-10-02 DIAGNOSIS — E0590 Thyrotoxicosis, unspecified without thyrotoxic crisis or storm: Secondary | ICD-10-CM | POA: Diagnosis not present

## 2017-10-02 DIAGNOSIS — R42 Dizziness and giddiness: Secondary | ICD-10-CM | POA: Diagnosis not present

## 2017-10-02 DIAGNOSIS — E6609 Other obesity due to excess calories: Secondary | ICD-10-CM | POA: Diagnosis not present

## 2017-10-02 DIAGNOSIS — M25561 Pain in right knee: Secondary | ICD-10-CM | POA: Diagnosis not present

## 2017-10-02 DIAGNOSIS — Z23 Encounter for immunization: Secondary | ICD-10-CM | POA: Diagnosis not present

## 2017-11-10 DIAGNOSIS — L851 Acquired keratosis [keratoderma] palmaris et plantaris: Secondary | ICD-10-CM | POA: Diagnosis not present

## 2017-11-10 DIAGNOSIS — M7741 Metatarsalgia, right foot: Secondary | ICD-10-CM | POA: Diagnosis not present

## 2017-11-10 DIAGNOSIS — M79671 Pain in right foot: Secondary | ICD-10-CM | POA: Diagnosis not present

## 2018-01-14 DIAGNOSIS — E782 Mixed hyperlipidemia: Secondary | ICD-10-CM | POA: Diagnosis not present

## 2018-01-14 DIAGNOSIS — I10 Essential (primary) hypertension: Secondary | ICD-10-CM | POA: Diagnosis not present

## 2018-02-11 DIAGNOSIS — H5202 Hypermetropia, left eye: Secondary | ICD-10-CM | POA: Diagnosis not present

## 2018-02-11 DIAGNOSIS — H52202 Unspecified astigmatism, left eye: Secondary | ICD-10-CM | POA: Diagnosis not present

## 2018-02-11 DIAGNOSIS — H5211 Myopia, right eye: Secondary | ICD-10-CM | POA: Diagnosis not present

## 2018-02-11 DIAGNOSIS — H2512 Age-related nuclear cataract, left eye: Secondary | ICD-10-CM | POA: Diagnosis not present

## 2018-02-11 DIAGNOSIS — H25012 Cortical age-related cataract, left eye: Secondary | ICD-10-CM | POA: Diagnosis not present

## 2018-02-11 DIAGNOSIS — Z961 Presence of intraocular lens: Secondary | ICD-10-CM | POA: Diagnosis not present

## 2018-02-11 DIAGNOSIS — H524 Presbyopia: Secondary | ICD-10-CM | POA: Diagnosis not present

## 2018-03-23 DIAGNOSIS — M7741 Metatarsalgia, right foot: Secondary | ICD-10-CM | POA: Diagnosis not present

## 2018-03-23 DIAGNOSIS — M79671 Pain in right foot: Secondary | ICD-10-CM | POA: Diagnosis not present

## 2018-03-23 DIAGNOSIS — L851 Acquired keratosis [keratoderma] palmaris et plantaris: Secondary | ICD-10-CM | POA: Diagnosis not present

## 2018-04-28 DIAGNOSIS — Z0001 Encounter for general adult medical examination with abnormal findings: Secondary | ICD-10-CM | POA: Diagnosis not present

## 2018-05-06 DIAGNOSIS — E669 Obesity, unspecified: Secondary | ICD-10-CM | POA: Diagnosis not present

## 2018-05-06 DIAGNOSIS — N400 Enlarged prostate without lower urinary tract symptoms: Secondary | ICD-10-CM | POA: Diagnosis not present

## 2018-05-06 DIAGNOSIS — I10 Essential (primary) hypertension: Secondary | ICD-10-CM | POA: Diagnosis not present

## 2018-05-06 DIAGNOSIS — E0590 Thyrotoxicosis, unspecified without thyrotoxic crisis or storm: Secondary | ICD-10-CM | POA: Diagnosis not present

## 2018-05-06 DIAGNOSIS — D649 Anemia, unspecified: Secondary | ICD-10-CM | POA: Diagnosis not present

## 2018-05-06 DIAGNOSIS — E782 Mixed hyperlipidemia: Secondary | ICD-10-CM | POA: Diagnosis not present

## 2018-05-06 DIAGNOSIS — M25561 Pain in right knee: Secondary | ICD-10-CM | POA: Diagnosis not present

## 2018-05-06 DIAGNOSIS — R42 Dizziness and giddiness: Secondary | ICD-10-CM | POA: Diagnosis not present

## 2018-06-10 DIAGNOSIS — I10 Essential (primary) hypertension: Secondary | ICD-10-CM | POA: Diagnosis not present

## 2018-06-10 DIAGNOSIS — N400 Enlarged prostate without lower urinary tract symptoms: Secondary | ICD-10-CM | POA: Diagnosis not present

## 2018-06-10 DIAGNOSIS — E782 Mixed hyperlipidemia: Secondary | ICD-10-CM | POA: Diagnosis not present

## 2018-06-10 DIAGNOSIS — D649 Anemia, unspecified: Secondary | ICD-10-CM | POA: Diagnosis not present

## 2018-06-22 DIAGNOSIS — N400 Enlarged prostate without lower urinary tract symptoms: Secondary | ICD-10-CM | POA: Diagnosis not present

## 2018-06-22 DIAGNOSIS — E782 Mixed hyperlipidemia: Secondary | ICD-10-CM | POA: Diagnosis not present

## 2018-06-22 DIAGNOSIS — D649 Anemia, unspecified: Secondary | ICD-10-CM | POA: Diagnosis not present

## 2018-06-22 DIAGNOSIS — E0590 Thyrotoxicosis, unspecified without thyrotoxic crisis or storm: Secondary | ICD-10-CM | POA: Diagnosis not present

## 2018-06-22 DIAGNOSIS — I10 Essential (primary) hypertension: Secondary | ICD-10-CM | POA: Diagnosis not present

## 2018-07-13 DIAGNOSIS — E069 Thyroiditis, unspecified: Secondary | ICD-10-CM | POA: Diagnosis not present

## 2018-07-13 DIAGNOSIS — I10 Essential (primary) hypertension: Secondary | ICD-10-CM | POA: Diagnosis not present

## 2018-07-13 DIAGNOSIS — E782 Mixed hyperlipidemia: Secondary | ICD-10-CM | POA: Diagnosis not present

## 2018-07-13 DIAGNOSIS — Z1329 Encounter for screening for other suspected endocrine disorder: Secondary | ICD-10-CM | POA: Diagnosis not present

## 2018-07-13 DIAGNOSIS — E669 Obesity, unspecified: Secondary | ICD-10-CM | POA: Diagnosis not present

## 2018-07-13 DIAGNOSIS — D509 Iron deficiency anemia, unspecified: Secondary | ICD-10-CM | POA: Diagnosis not present

## 2018-07-13 DIAGNOSIS — D508 Other iron deficiency anemias: Secondary | ICD-10-CM | POA: Diagnosis not present

## 2018-07-13 DIAGNOSIS — D649 Anemia, unspecified: Secondary | ICD-10-CM | POA: Diagnosis not present

## 2018-07-13 DIAGNOSIS — R972 Elevated prostate specific antigen [PSA]: Secondary | ICD-10-CM | POA: Diagnosis not present

## 2018-07-20 DIAGNOSIS — E0590 Thyrotoxicosis, unspecified without thyrotoxic crisis or storm: Secondary | ICD-10-CM | POA: Diagnosis not present

## 2018-07-20 DIAGNOSIS — R42 Dizziness and giddiness: Secondary | ICD-10-CM | POA: Diagnosis not present

## 2018-07-20 DIAGNOSIS — Z6841 Body Mass Index (BMI) 40.0 and over, adult: Secondary | ICD-10-CM | POA: Diagnosis not present

## 2018-07-20 DIAGNOSIS — E782 Mixed hyperlipidemia: Secondary | ICD-10-CM | POA: Diagnosis not present

## 2018-07-20 DIAGNOSIS — R972 Elevated prostate specific antigen [PSA]: Secondary | ICD-10-CM | POA: Diagnosis not present

## 2018-07-20 DIAGNOSIS — M25561 Pain in right knee: Secondary | ICD-10-CM | POA: Diagnosis not present

## 2018-07-20 DIAGNOSIS — I10 Essential (primary) hypertension: Secondary | ICD-10-CM | POA: Diagnosis not present

## 2018-07-20 DIAGNOSIS — N400 Enlarged prostate without lower urinary tract symptoms: Secondary | ICD-10-CM | POA: Diagnosis not present

## 2018-07-20 DIAGNOSIS — D649 Anemia, unspecified: Secondary | ICD-10-CM | POA: Diagnosis not present

## 2018-07-20 DIAGNOSIS — R7301 Impaired fasting glucose: Secondary | ICD-10-CM | POA: Diagnosis not present

## 2018-08-03 DIAGNOSIS — Z6841 Body Mass Index (BMI) 40.0 and over, adult: Secondary | ICD-10-CM | POA: Diagnosis not present

## 2018-08-03 DIAGNOSIS — R42 Dizziness and giddiness: Secondary | ICD-10-CM | POA: Diagnosis not present

## 2018-08-03 DIAGNOSIS — E782 Mixed hyperlipidemia: Secondary | ICD-10-CM | POA: Diagnosis not present

## 2018-08-03 DIAGNOSIS — E0590 Thyrotoxicosis, unspecified without thyrotoxic crisis or storm: Secondary | ICD-10-CM | POA: Diagnosis not present

## 2018-08-03 DIAGNOSIS — D649 Anemia, unspecified: Secondary | ICD-10-CM | POA: Diagnosis not present

## 2018-08-03 DIAGNOSIS — R7301 Impaired fasting glucose: Secondary | ICD-10-CM | POA: Diagnosis not present

## 2018-08-03 DIAGNOSIS — N400 Enlarged prostate without lower urinary tract symptoms: Secondary | ICD-10-CM | POA: Diagnosis not present

## 2018-08-03 DIAGNOSIS — M25561 Pain in right knee: Secondary | ICD-10-CM | POA: Diagnosis not present

## 2018-08-03 DIAGNOSIS — R972 Elevated prostate specific antigen [PSA]: Secondary | ICD-10-CM | POA: Diagnosis not present

## 2018-08-03 DIAGNOSIS — I10 Essential (primary) hypertension: Secondary | ICD-10-CM | POA: Diagnosis not present

## 2018-08-17 DIAGNOSIS — L851 Acquired keratosis [keratoderma] palmaris et plantaris: Secondary | ICD-10-CM | POA: Diagnosis not present

## 2018-08-17 DIAGNOSIS — M79671 Pain in right foot: Secondary | ICD-10-CM | POA: Diagnosis not present

## 2018-08-17 DIAGNOSIS — M7741 Metatarsalgia, right foot: Secondary | ICD-10-CM | POA: Diagnosis not present

## 2018-08-17 DIAGNOSIS — G629 Polyneuropathy, unspecified: Secondary | ICD-10-CM | POA: Diagnosis not present

## 2018-08-20 ENCOUNTER — Other Ambulatory Visit: Payer: Self-pay

## 2018-09-02 DIAGNOSIS — R972 Elevated prostate specific antigen [PSA]: Secondary | ICD-10-CM | POA: Diagnosis not present

## 2018-09-02 DIAGNOSIS — E782 Mixed hyperlipidemia: Secondary | ICD-10-CM | POA: Diagnosis not present

## 2018-09-02 DIAGNOSIS — E0590 Thyrotoxicosis, unspecified without thyrotoxic crisis or storm: Secondary | ICD-10-CM | POA: Diagnosis not present

## 2018-09-02 DIAGNOSIS — R7301 Impaired fasting glucose: Secondary | ICD-10-CM | POA: Diagnosis not present

## 2018-09-02 DIAGNOSIS — N400 Enlarged prostate without lower urinary tract symptoms: Secondary | ICD-10-CM | POA: Diagnosis not present

## 2018-09-02 DIAGNOSIS — M25561 Pain in right knee: Secondary | ICD-10-CM | POA: Diagnosis not present

## 2018-09-02 DIAGNOSIS — I10 Essential (primary) hypertension: Secondary | ICD-10-CM | POA: Diagnosis not present

## 2018-09-02 DIAGNOSIS — D649 Anemia, unspecified: Secondary | ICD-10-CM | POA: Diagnosis not present

## 2018-10-01 DIAGNOSIS — I10 Essential (primary) hypertension: Secondary | ICD-10-CM | POA: Diagnosis not present

## 2018-10-01 DIAGNOSIS — E782 Mixed hyperlipidemia: Secondary | ICD-10-CM | POA: Diagnosis not present

## 2018-10-01 DIAGNOSIS — D649 Anemia, unspecified: Secondary | ICD-10-CM | POA: Diagnosis not present

## 2018-10-01 DIAGNOSIS — E0590 Thyrotoxicosis, unspecified without thyrotoxic crisis or storm: Secondary | ICD-10-CM | POA: Diagnosis not present

## 2018-10-01 DIAGNOSIS — M25561 Pain in right knee: Secondary | ICD-10-CM | POA: Diagnosis not present

## 2018-10-01 DIAGNOSIS — N400 Enlarged prostate without lower urinary tract symptoms: Secondary | ICD-10-CM | POA: Diagnosis not present

## 2018-10-01 DIAGNOSIS — R7301 Impaired fasting glucose: Secondary | ICD-10-CM | POA: Diagnosis not present

## 2018-10-01 DIAGNOSIS — R972 Elevated prostate specific antigen [PSA]: Secondary | ICD-10-CM | POA: Diagnosis not present

## 2018-11-03 DIAGNOSIS — R972 Elevated prostate specific antigen [PSA]: Secondary | ICD-10-CM | POA: Diagnosis not present

## 2018-11-03 DIAGNOSIS — R7301 Impaired fasting glucose: Secondary | ICD-10-CM | POA: Diagnosis not present

## 2018-11-03 DIAGNOSIS — D649 Anemia, unspecified: Secondary | ICD-10-CM | POA: Diagnosis not present

## 2018-11-03 DIAGNOSIS — E0590 Thyrotoxicosis, unspecified without thyrotoxic crisis or storm: Secondary | ICD-10-CM | POA: Diagnosis not present

## 2018-11-03 DIAGNOSIS — E782 Mixed hyperlipidemia: Secondary | ICD-10-CM | POA: Diagnosis not present

## 2018-11-03 DIAGNOSIS — N400 Enlarged prostate without lower urinary tract symptoms: Secondary | ICD-10-CM | POA: Diagnosis not present

## 2018-11-03 DIAGNOSIS — M25561 Pain in right knee: Secondary | ICD-10-CM | POA: Diagnosis not present

## 2018-11-03 DIAGNOSIS — I10 Essential (primary) hypertension: Secondary | ICD-10-CM | POA: Diagnosis not present

## 2018-12-02 DIAGNOSIS — E0590 Thyrotoxicosis, unspecified without thyrotoxic crisis or storm: Secondary | ICD-10-CM | POA: Diagnosis not present

## 2018-12-02 DIAGNOSIS — I10 Essential (primary) hypertension: Secondary | ICD-10-CM | POA: Diagnosis not present

## 2018-12-02 DIAGNOSIS — R7301 Impaired fasting glucose: Secondary | ICD-10-CM | POA: Diagnosis not present

## 2018-12-02 DIAGNOSIS — M25561 Pain in right knee: Secondary | ICD-10-CM | POA: Diagnosis not present

## 2018-12-02 DIAGNOSIS — R972 Elevated prostate specific antigen [PSA]: Secondary | ICD-10-CM | POA: Diagnosis not present

## 2018-12-02 DIAGNOSIS — D649 Anemia, unspecified: Secondary | ICD-10-CM | POA: Diagnosis not present

## 2018-12-02 DIAGNOSIS — E782 Mixed hyperlipidemia: Secondary | ICD-10-CM | POA: Diagnosis not present

## 2018-12-02 DIAGNOSIS — N400 Enlarged prostate without lower urinary tract symptoms: Secondary | ICD-10-CM | POA: Diagnosis not present

## 2019-01-07 DIAGNOSIS — E782 Mixed hyperlipidemia: Secondary | ICD-10-CM | POA: Diagnosis not present

## 2019-01-07 DIAGNOSIS — R972 Elevated prostate specific antigen [PSA]: Secondary | ICD-10-CM | POA: Diagnosis not present

## 2019-01-07 DIAGNOSIS — R42 Dizziness and giddiness: Secondary | ICD-10-CM | POA: Diagnosis not present

## 2019-01-07 DIAGNOSIS — Z712 Person consulting for explanation of examination or test findings: Secondary | ICD-10-CM | POA: Diagnosis not present

## 2019-01-07 DIAGNOSIS — D649 Anemia, unspecified: Secondary | ICD-10-CM | POA: Diagnosis not present

## 2019-01-07 DIAGNOSIS — N400 Enlarged prostate without lower urinary tract symptoms: Secondary | ICD-10-CM | POA: Diagnosis not present

## 2019-01-07 DIAGNOSIS — L851 Acquired keratosis [keratoderma] palmaris et plantaris: Secondary | ICD-10-CM | POA: Diagnosis not present

## 2019-01-07 DIAGNOSIS — G629 Polyneuropathy, unspecified: Secondary | ICD-10-CM | POA: Diagnosis not present

## 2019-01-07 DIAGNOSIS — R7301 Impaired fasting glucose: Secondary | ICD-10-CM | POA: Diagnosis not present

## 2019-01-07 DIAGNOSIS — E6609 Other obesity due to excess calories: Secondary | ICD-10-CM | POA: Diagnosis not present

## 2019-01-07 DIAGNOSIS — M7741 Metatarsalgia, right foot: Secondary | ICD-10-CM | POA: Diagnosis not present

## 2019-01-07 DIAGNOSIS — E0590 Thyrotoxicosis, unspecified without thyrotoxic crisis or storm: Secondary | ICD-10-CM | POA: Diagnosis not present

## 2019-01-07 DIAGNOSIS — M25561 Pain in right knee: Secondary | ICD-10-CM | POA: Diagnosis not present

## 2019-01-07 DIAGNOSIS — I10 Essential (primary) hypertension: Secondary | ICD-10-CM | POA: Diagnosis not present

## 2019-01-07 DIAGNOSIS — Z6841 Body Mass Index (BMI) 40.0 and over, adult: Secondary | ICD-10-CM | POA: Diagnosis not present

## 2019-01-07 DIAGNOSIS — D508 Other iron deficiency anemias: Secondary | ICD-10-CM | POA: Diagnosis not present

## 2019-01-07 DIAGNOSIS — M79671 Pain in right foot: Secondary | ICD-10-CM | POA: Diagnosis not present

## 2019-01-07 DIAGNOSIS — Z23 Encounter for immunization: Secondary | ICD-10-CM | POA: Diagnosis not present

## 2019-01-11 DIAGNOSIS — M25561 Pain in right knee: Secondary | ICD-10-CM | POA: Diagnosis not present

## 2019-01-11 DIAGNOSIS — E782 Mixed hyperlipidemia: Secondary | ICD-10-CM | POA: Diagnosis not present

## 2019-01-11 DIAGNOSIS — Z23 Encounter for immunization: Secondary | ICD-10-CM | POA: Diagnosis not present

## 2019-01-11 DIAGNOSIS — R42 Dizziness and giddiness: Secondary | ICD-10-CM | POA: Diagnosis not present

## 2019-01-11 DIAGNOSIS — R7301 Impaired fasting glucose: Secondary | ICD-10-CM | POA: Diagnosis not present

## 2019-01-11 DIAGNOSIS — D649 Anemia, unspecified: Secondary | ICD-10-CM | POA: Diagnosis not present

## 2019-01-11 DIAGNOSIS — R972 Elevated prostate specific antigen [PSA]: Secondary | ICD-10-CM | POA: Diagnosis not present

## 2019-01-11 DIAGNOSIS — I10 Essential (primary) hypertension: Secondary | ICD-10-CM | POA: Diagnosis not present

## 2019-01-11 DIAGNOSIS — Z6841 Body Mass Index (BMI) 40.0 and over, adult: Secondary | ICD-10-CM | POA: Diagnosis not present

## 2019-01-11 DIAGNOSIS — E0590 Thyrotoxicosis, unspecified without thyrotoxic crisis or storm: Secondary | ICD-10-CM | POA: Diagnosis not present

## 2019-01-11 DIAGNOSIS — N400 Enlarged prostate without lower urinary tract symptoms: Secondary | ICD-10-CM | POA: Diagnosis not present

## 2019-01-18 DIAGNOSIS — J069 Acute upper respiratory infection, unspecified: Secondary | ICD-10-CM | POA: Diagnosis not present

## 2019-02-02 DIAGNOSIS — Z23 Encounter for immunization: Secondary | ICD-10-CM | POA: Diagnosis not present

## 2019-02-02 DIAGNOSIS — E782 Mixed hyperlipidemia: Secondary | ICD-10-CM | POA: Diagnosis not present

## 2019-02-02 DIAGNOSIS — R972 Elevated prostate specific antigen [PSA]: Secondary | ICD-10-CM | POA: Diagnosis not present

## 2019-02-02 DIAGNOSIS — I10 Essential (primary) hypertension: Secondary | ICD-10-CM | POA: Diagnosis not present

## 2019-02-02 DIAGNOSIS — Z6841 Body Mass Index (BMI) 40.0 and over, adult: Secondary | ICD-10-CM | POA: Diagnosis not present

## 2019-02-02 DIAGNOSIS — D649 Anemia, unspecified: Secondary | ICD-10-CM | POA: Diagnosis not present

## 2019-02-02 DIAGNOSIS — I2510 Atherosclerotic heart disease of native coronary artery without angina pectoris: Secondary | ICD-10-CM | POA: Diagnosis not present

## 2019-02-02 DIAGNOSIS — M25561 Pain in right knee: Secondary | ICD-10-CM | POA: Diagnosis not present

## 2019-02-02 DIAGNOSIS — R42 Dizziness and giddiness: Secondary | ICD-10-CM | POA: Diagnosis not present

## 2019-02-02 DIAGNOSIS — N400 Enlarged prostate without lower urinary tract symptoms: Secondary | ICD-10-CM | POA: Diagnosis not present

## 2019-02-02 DIAGNOSIS — E0590 Thyrotoxicosis, unspecified without thyrotoxic crisis or storm: Secondary | ICD-10-CM | POA: Diagnosis not present

## 2019-02-11 ENCOUNTER — Inpatient Hospital Stay (HOSPITAL_COMMUNITY)
Admission: EM | Admit: 2019-02-11 | Discharge: 2019-03-12 | DRG: 987 | Disposition: A | Discharge status: Skilled Nursing Facility | Payer: PPO | Attending: Internal Medicine | Admitting: Internal Medicine

## 2019-02-11 ENCOUNTER — Other Ambulatory Visit: Payer: Self-pay

## 2019-02-11 ENCOUNTER — Encounter (HOSPITAL_COMMUNITY): Payer: Self-pay | Admitting: Emergency Medicine

## 2019-02-11 ENCOUNTER — Inpatient Hospital Stay (HOSPITAL_COMMUNITY): Payer: PPO

## 2019-02-11 ENCOUNTER — Emergency Department (HOSPITAL_COMMUNITY): Payer: PPO

## 2019-02-11 DIAGNOSIS — Z7982 Long term (current) use of aspirin: Secondary | ICD-10-CM

## 2019-02-11 DIAGNOSIS — I4891 Unspecified atrial fibrillation: Secondary | ICD-10-CM | POA: Diagnosis not present

## 2019-02-11 DIAGNOSIS — I10 Essential (primary) hypertension: Secondary | ICD-10-CM | POA: Diagnosis present

## 2019-02-11 DIAGNOSIS — Z8249 Family history of ischemic heart disease and other diseases of the circulatory system: Secondary | ICD-10-CM

## 2019-02-11 DIAGNOSIS — E669 Obesity, unspecified: Secondary | ICD-10-CM | POA: Diagnosis present

## 2019-02-11 DIAGNOSIS — D62 Acute posthemorrhagic anemia: Secondary | ICD-10-CM | POA: Diagnosis not present

## 2019-02-11 DIAGNOSIS — R319 Hematuria, unspecified: Secondary | ICD-10-CM

## 2019-02-11 DIAGNOSIS — T8383XA Hemorrhage of genitourinary prosthetic devices, implants and grafts, initial encounter: Secondary | ICD-10-CM | POA: Diagnosis not present

## 2019-02-11 DIAGNOSIS — Z741 Need for assistance with personal care: Secondary | ICD-10-CM | POA: Diagnosis not present

## 2019-02-11 DIAGNOSIS — R531 Weakness: Secondary | ICD-10-CM | POA: Diagnosis not present

## 2019-02-11 DIAGNOSIS — R05 Cough: Secondary | ICD-10-CM | POA: Diagnosis not present

## 2019-02-11 DIAGNOSIS — N179 Acute kidney failure, unspecified: Secondary | ICD-10-CM | POA: Diagnosis not present

## 2019-02-11 DIAGNOSIS — N320 Bladder-neck obstruction: Secondary | ICD-10-CM | POA: Diagnosis not present

## 2019-02-11 DIAGNOSIS — N3289 Other specified disorders of bladder: Secondary | ICD-10-CM | POA: Diagnosis present

## 2019-02-11 DIAGNOSIS — R310 Gross hematuria: Secondary | ICD-10-CM

## 2019-02-11 DIAGNOSIS — I480 Paroxysmal atrial fibrillation: Principal | ICD-10-CM | POA: Diagnosis present

## 2019-02-11 DIAGNOSIS — R0602 Shortness of breath: Secondary | ICD-10-CM | POA: Diagnosis not present

## 2019-02-11 DIAGNOSIS — I2510 Atherosclerotic heart disease of native coronary artery without angina pectoris: Secondary | ICD-10-CM

## 2019-02-11 DIAGNOSIS — Z6835 Body mass index (BMI) 35.0-35.9, adult: Secondary | ICD-10-CM | POA: Diagnosis not present

## 2019-02-11 DIAGNOSIS — Z66 Do not resuscitate: Secondary | ICD-10-CM

## 2019-02-11 DIAGNOSIS — K56600 Partial intestinal obstruction, unspecified as to cause: Secondary | ICD-10-CM | POA: Diagnosis not present

## 2019-02-11 DIAGNOSIS — J189 Pneumonia, unspecified organism: Secondary | ICD-10-CM | POA: Diagnosis present

## 2019-02-11 DIAGNOSIS — Z87891 Personal history of nicotine dependence: Secondary | ICD-10-CM

## 2019-02-11 DIAGNOSIS — Z515 Encounter for palliative care: Secondary | ICD-10-CM

## 2019-02-11 DIAGNOSIS — E785 Hyperlipidemia, unspecified: Secondary | ICD-10-CM | POA: Diagnosis not present

## 2019-02-11 DIAGNOSIS — K5651 Intestinal adhesions [bands], with partial obstruction: Secondary | ICD-10-CM | POA: Diagnosis not present

## 2019-02-11 DIAGNOSIS — R112 Nausea with vomiting, unspecified: Secondary | ICD-10-CM | POA: Diagnosis not present

## 2019-02-11 DIAGNOSIS — D696 Thrombocytopenia, unspecified: Secondary | ICD-10-CM | POA: Diagnosis not present

## 2019-02-11 DIAGNOSIS — K430 Incisional hernia with obstruction, without gangrene: Secondary | ICD-10-CM | POA: Diagnosis not present

## 2019-02-11 DIAGNOSIS — K56609 Unspecified intestinal obstruction, unspecified as to partial versus complete obstruction: Secondary | ICD-10-CM | POA: Diagnosis present

## 2019-02-11 DIAGNOSIS — N4289 Other specified disorders of prostate: Secondary | ICD-10-CM | POA: Diagnosis not present

## 2019-02-11 DIAGNOSIS — F4329 Adjustment disorder with other symptoms: Secondary | ICD-10-CM | POA: Diagnosis not present

## 2019-02-11 DIAGNOSIS — R52 Pain, unspecified: Secondary | ICD-10-CM | POA: Diagnosis present

## 2019-02-11 DIAGNOSIS — I252 Old myocardial infarction: Secondary | ICD-10-CM

## 2019-02-11 DIAGNOSIS — I361 Nonrheumatic tricuspid (valve) insufficiency: Secondary | ICD-10-CM | POA: Diagnosis not present

## 2019-02-11 DIAGNOSIS — Y846 Urinary catheterization as the cause of abnormal reaction of the patient, or of later complication, without mention of misadventure at the time of the procedure: Secondary | ICD-10-CM | POA: Diagnosis not present

## 2019-02-11 DIAGNOSIS — Z48816 Encounter for surgical aftercare following surgery on the genitourinary system: Secondary | ICD-10-CM | POA: Diagnosis not present

## 2019-02-11 DIAGNOSIS — K5989 Other specified functional intestinal disorders: Secondary | ICD-10-CM | POA: Diagnosis not present

## 2019-02-11 DIAGNOSIS — N401 Enlarged prostate with lower urinary tract symptoms: Secondary | ICD-10-CM | POA: Diagnosis not present

## 2019-02-11 DIAGNOSIS — Z955 Presence of coronary angioplasty implant and graft: Secondary | ICD-10-CM | POA: Diagnosis not present

## 2019-02-11 DIAGNOSIS — B961 Klebsiella pneumoniae [K. pneumoniae] as the cause of diseases classified elsewhere: Secondary | ICD-10-CM | POA: Diagnosis not present

## 2019-02-11 DIAGNOSIS — B964 Proteus (mirabilis) (morganii) as the cause of diseases classified elsewhere: Secondary | ICD-10-CM | POA: Diagnosis not present

## 2019-02-11 DIAGNOSIS — E0590 Thyrotoxicosis, unspecified without thyrotoxic crisis or storm: Secondary | ICD-10-CM | POA: Diagnosis not present

## 2019-02-11 DIAGNOSIS — Z801 Family history of malignant neoplasm of trachea, bronchus and lung: Secondary | ICD-10-CM

## 2019-02-11 DIAGNOSIS — M2550 Pain in unspecified joint: Secondary | ICD-10-CM | POA: Diagnosis not present

## 2019-02-11 DIAGNOSIS — I2583 Coronary atherosclerosis due to lipid rich plaque: Secondary | ICD-10-CM | POA: Diagnosis not present

## 2019-02-11 DIAGNOSIS — N400 Enlarged prostate without lower urinary tract symptoms: Secondary | ICD-10-CM | POA: Diagnosis present

## 2019-02-11 DIAGNOSIS — Z7401 Bed confinement status: Secondary | ICD-10-CM | POA: Diagnosis not present

## 2019-02-11 DIAGNOSIS — Z79899 Other long term (current) drug therapy: Secondary | ICD-10-CM

## 2019-02-11 DIAGNOSIS — R7301 Impaired fasting glucose: Secondary | ICD-10-CM | POA: Diagnosis not present

## 2019-02-11 DIAGNOSIS — I4892 Unspecified atrial flutter: Secondary | ICD-10-CM | POA: Diagnosis not present

## 2019-02-11 DIAGNOSIS — Z4682 Encounter for fitting and adjustment of non-vascular catheter: Secondary | ICD-10-CM | POA: Diagnosis not present

## 2019-02-11 DIAGNOSIS — I482 Chronic atrial fibrillation, unspecified: Secondary | ICD-10-CM | POA: Diagnosis present

## 2019-02-11 DIAGNOSIS — R059 Cough, unspecified: Secondary | ICD-10-CM

## 2019-02-11 DIAGNOSIS — K219 Gastro-esophageal reflux disease without esophagitis: Secondary | ICD-10-CM | POA: Diagnosis present

## 2019-02-11 DIAGNOSIS — D649 Anemia, unspecified: Secondary | ICD-10-CM | POA: Diagnosis not present

## 2019-02-11 DIAGNOSIS — R9431 Abnormal electrocardiogram [ECG] [EKG]: Secondary | ICD-10-CM | POA: Diagnosis not present

## 2019-02-11 DIAGNOSIS — Z8701 Personal history of pneumonia (recurrent): Secondary | ICD-10-CM

## 2019-02-11 DIAGNOSIS — R972 Elevated prostate specific antigen [PSA]: Secondary | ICD-10-CM | POA: Diagnosis not present

## 2019-02-11 DIAGNOSIS — N3091 Cystitis, unspecified with hematuria: Secondary | ICD-10-CM | POA: Diagnosis not present

## 2019-02-11 DIAGNOSIS — Z9861 Coronary angioplasty status: Secondary | ICD-10-CM

## 2019-02-11 DIAGNOSIS — Z8616 Personal history of COVID-19: Secondary | ICD-10-CM

## 2019-02-11 DIAGNOSIS — R262 Difficulty in walking, not elsewhere classified: Secondary | ICD-10-CM | POA: Diagnosis not present

## 2019-02-11 DIAGNOSIS — M6281 Muscle weakness (generalized): Secondary | ICD-10-CM | POA: Diagnosis not present

## 2019-02-11 DIAGNOSIS — U071 COVID-19: Secondary | ICD-10-CM | POA: Diagnosis not present

## 2019-02-11 DIAGNOSIS — F4321 Adjustment disorder with depressed mood: Secondary | ICD-10-CM | POA: Diagnosis not present

## 2019-02-11 DIAGNOSIS — Z7901 Long term (current) use of anticoagulants: Secondary | ICD-10-CM | POA: Diagnosis not present

## 2019-02-11 DIAGNOSIS — E0789 Other specified disorders of thyroid: Secondary | ICD-10-CM | POA: Diagnosis not present

## 2019-02-11 DIAGNOSIS — E782 Mixed hyperlipidemia: Secondary | ICD-10-CM | POA: Diagnosis not present

## 2019-02-11 DIAGNOSIS — Z978 Presence of other specified devices: Secondary | ICD-10-CM | POA: Diagnosis not present

## 2019-02-11 DIAGNOSIS — R000 Tachycardia, unspecified: Secondary | ICD-10-CM | POA: Diagnosis not present

## 2019-02-11 DIAGNOSIS — I959 Hypotension, unspecified: Secondary | ICD-10-CM | POA: Diagnosis not present

## 2019-02-11 DIAGNOSIS — Z4659 Encounter for fitting and adjustment of other gastrointestinal appliance and device: Secondary | ICD-10-CM

## 2019-02-11 DIAGNOSIS — K5900 Constipation, unspecified: Secondary | ICD-10-CM | POA: Diagnosis not present

## 2019-02-11 DIAGNOSIS — R109 Unspecified abdominal pain: Secondary | ICD-10-CM | POA: Diagnosis not present

## 2019-02-11 DIAGNOSIS — R1111 Vomiting without nausea: Secondary | ICD-10-CM | POA: Diagnosis not present

## 2019-02-11 DIAGNOSIS — Z95828 Presence of other vascular implants and grafts: Secondary | ICD-10-CM | POA: Diagnosis not present

## 2019-02-11 DIAGNOSIS — N390 Urinary tract infection, site not specified: Secondary | ICD-10-CM | POA: Diagnosis not present

## 2019-02-11 DIAGNOSIS — E876 Hypokalemia: Secondary | ICD-10-CM | POA: Diagnosis not present

## 2019-02-11 DIAGNOSIS — K5669 Other partial intestinal obstruction: Secondary | ICD-10-CM | POA: Diagnosis not present

## 2019-02-11 DIAGNOSIS — N1832 Chronic kidney disease, stage 3b: Secondary | ICD-10-CM | POA: Diagnosis present

## 2019-02-11 DIAGNOSIS — Z888 Allergy status to other drugs, medicaments and biological substances status: Secondary | ICD-10-CM

## 2019-02-11 LAB — COMPREHENSIVE METABOLIC PANEL
ALT: 22 U/L (ref 0–44)
AST: 19 U/L (ref 15–41)
Albumin: 3.4 g/dL — ABNORMAL LOW (ref 3.5–5.0)
Alkaline Phosphatase: 78 U/L (ref 38–126)
Anion gap: 14 (ref 5–15)
BUN: 43 mg/dL — ABNORMAL HIGH (ref 8–23)
CO2: 30 mmol/L (ref 22–32)
Calcium: 8.8 mg/dL — ABNORMAL LOW (ref 8.9–10.3)
Chloride: 101 mmol/L (ref 98–111)
Creatinine, Ser: 1.71 mg/dL — ABNORMAL HIGH (ref 0.61–1.24)
GFR calc Af Amer: 42 mL/min — ABNORMAL LOW (ref >60)
GFR calc non Af Amer: 36 mL/min — ABNORMAL LOW (ref >60)
Glucose, Bld: 131 mg/dL — ABNORMAL HIGH (ref 70–99)
Potassium: 4.7 mmol/L (ref 3.5–5.1)
Sodium: 145 mmol/L (ref 135–145)
Total Bilirubin: 0.7 mg/dL (ref 0.3–1.2)
Total Protein: 6.8 g/dL (ref 6.5–8.1)

## 2019-02-11 LAB — RESPIRATORY PANEL BY RT PCR (FLU A&B, COVID)
Influenza A by PCR: NEGATIVE
Influenza B by PCR: NEGATIVE
SARS Coronavirus 2 by RT PCR: POSITIVE — AB

## 2019-02-11 LAB — URINALYSIS, ROUTINE W REFLEX MICROSCOPIC
Bilirubin Urine: NEGATIVE
Glucose, UA: NEGATIVE mg/dL
Hgb urine dipstick: NEGATIVE
Ketones, ur: NEGATIVE mg/dL
Leukocytes,Ua: NEGATIVE
Nitrite: NEGATIVE
Protein, ur: NEGATIVE mg/dL
Specific Gravity, Urine: 1.017 (ref 1.005–1.030)
pH: 8 (ref 5.0–8.0)

## 2019-02-11 LAB — CBC WITH DIFFERENTIAL/PLATELET
Abs Immature Granulocytes: 0.09 10*3/uL — ABNORMAL HIGH (ref 0.00–0.07)
Basophils Absolute: 0 10*3/uL (ref 0.0–0.1)
Basophils Relative: 0 %
Eosinophils Absolute: 0 10*3/uL (ref 0.0–0.5)
Eosinophils Relative: 0 %
HCT: 42.5 % (ref 39.0–52.0)
Hemoglobin: 13.8 g/dL (ref 13.0–17.0)
Immature Granulocytes: 1 %
Lymphocytes Relative: 10 %
Lymphs Abs: 1.4 10*3/uL (ref 0.7–4.0)
MCH: 30.1 pg (ref 26.0–34.0)
MCHC: 32.5 g/dL (ref 30.0–36.0)
MCV: 92.6 fL (ref 80.0–100.0)
Monocytes Absolute: 1.4 10*3/uL — ABNORMAL HIGH (ref 0.1–1.0)
Monocytes Relative: 11 %
Neutro Abs: 10.1 10*3/uL — ABNORMAL HIGH (ref 1.7–7.7)
Neutrophils Relative %: 78 %
Platelets: 192 10*3/uL (ref 150–400)
RBC: 4.59 MIL/uL (ref 4.22–5.81)
RDW: 13.5 % (ref 11.5–15.5)
WBC: 12.9 10*3/uL — ABNORMAL HIGH (ref 4.0–10.5)
nRBC: 0 % (ref 0.0–0.2)

## 2019-02-11 LAB — TROPONIN I (HIGH SENSITIVITY)
Troponin I (High Sensitivity): 10 ng/L (ref <18)
Troponin I (High Sensitivity): 10 ng/L (ref <18)

## 2019-02-11 LAB — TSH: TSH: 0.026 u[IU]/mL — ABNORMAL LOW (ref 0.350–4.500)

## 2019-02-11 MED ORDER — HEPARIN BOLUS VIA INFUSION
4000.0000 [IU] | Freq: Once | INTRAVENOUS | Status: AC
  Administered 2019-02-11: 21:00:00 4000 [IU] via INTRAVENOUS

## 2019-02-11 MED ORDER — SODIUM CHLORIDE 0.9 % IV BOLUS
1000.0000 mL | Freq: Once | INTRAVENOUS | Status: AC
  Administered 2019-02-11: 1000 mL via INTRAVENOUS

## 2019-02-11 MED ORDER — ACETAMINOPHEN 325 MG PO TABS
650.0000 mg | ORAL_TABLET | Freq: Four times a day (QID) | ORAL | Status: DC | PRN
  Administered 2019-02-15 – 2019-02-28 (×5): 650 mg via ORAL
  Filled 2019-02-11 (×5): qty 2

## 2019-02-11 MED ORDER — METOPROLOL TARTRATE 5 MG/5ML IV SOLN
5.0000 mg | Freq: Once | INTRAVENOUS | Status: AC
  Administered 2019-02-11: 20:00:00 5 mg via INTRAVENOUS
  Filled 2019-02-11: qty 5

## 2019-02-11 MED ORDER — METOPROLOL TARTRATE 5 MG/5ML IV SOLN
2.5000 mg | Freq: Once | INTRAVENOUS | Status: AC
  Administered 2019-02-11: 19:00:00 2.5 mg via INTRAVENOUS
  Filled 2019-02-11: qty 5

## 2019-02-11 MED ORDER — DILTIAZEM HCL 25 MG/5ML IV SOLN
10.0000 mg | Freq: Once | INTRAVENOUS | Status: AC
  Administered 2019-02-11: 10 mg via INTRAVENOUS
  Filled 2019-02-11: qty 5

## 2019-02-11 MED ORDER — COENZYME Q10 200 MG PO CAPS: 200.0000 mg | ORAL_CAPSULE | Freq: Every day | ORAL | Status: DC

## 2019-02-11 MED ORDER — ONDANSETRON HCL 4 MG/2ML IJ SOLN
4.0000 mg | Freq: Four times a day (QID) | INTRAMUSCULAR | Status: DC | PRN
  Administered 2019-02-11 – 2019-02-28 (×4): 4 mg via INTRAVENOUS
  Filled 2019-02-11 (×6): qty 2

## 2019-02-11 MED ORDER — SODIUM CHLORIDE 0.9 % IV BOLUS
500.0000 mL | Freq: Once | INTRAVENOUS | Status: AC
  Administered 2019-02-11: 500 mL via INTRAVENOUS

## 2019-02-11 MED ORDER — DILTIAZEM HCL-DEXTROSE 125-5 MG/125ML-% IV SOLN (PREMIX)
15.0000 mg/h | INTRAVENOUS | Status: DC
  Administered 2019-02-11: 19:00:00 5 mg/h via INTRAVENOUS
  Administered 2019-02-12: 10:00:00 10 mg/h via INTRAVENOUS
  Administered 2019-02-12 – 2019-02-13 (×3): 15 mg/h via INTRAVENOUS
  Administered 2019-02-14: 08:00:00 5 mg/h via INTRAVENOUS
  Administered 2019-02-14 – 2019-02-20 (×12): 15 mg/h via INTRAVENOUS
  Filled 2019-02-11 (×33): qty 125

## 2019-02-11 MED ORDER — HEPARIN (PORCINE) 25000 UT/250ML-% IV SOLN
1100.0000 [IU]/h | INTRAVENOUS | Status: DC
  Administered 2019-02-11: 1000 [IU]/h via INTRAVENOUS
  Administered 2019-02-12: 1500 [IU]/h via INTRAVENOUS
  Administered 2019-02-13 (×2): 1350 [IU]/h via INTRAVENOUS
  Administered 2019-02-14: 1100 [IU]/h via INTRAVENOUS
  Filled 2019-02-11 (×6): qty 250

## 2019-02-11 MED ORDER — DILTIAZEM HCL 25 MG/5ML IV SOLN
10.0000 mg | Freq: Once | INTRAVENOUS | Status: AC
  Administered 2019-02-11: 20:00:00 10 mg via INTRAVENOUS

## 2019-02-11 MED ORDER — DIGOXIN 0.25 MG/ML IJ SOLN
0.2500 mg | Freq: Once | INTRAMUSCULAR | Status: AC
  Administered 2019-02-11: 22:00:00 0.25 mg via INTRAVENOUS
  Filled 2019-02-11: qty 2

## 2019-02-11 MED ORDER — SODIUM CHLORIDE 0.9 % IV SOLN: INTRAVENOUS | Status: AC

## 2019-02-11 MED ORDER — ACETAMINOPHEN 650 MG RE SUPP: 650.0000 mg | Freq: Four times a day (QID) | RECTAL | Status: DC | PRN

## 2019-02-11 MED ORDER — PRAVASTATIN SODIUM 10 MG PO TABS
10.0000 mg | ORAL_TABLET | Freq: Every evening | ORAL | Status: DC
  Filled 2019-02-11 (×2): qty 1

## 2019-02-11 MED ORDER — OMEGA-3-ACID ETHYL ESTERS 1 G PO CAPS
2.0000 g | ORAL_CAPSULE | Freq: Every day | ORAL | Status: DC
  Filled 2019-02-11 (×3): qty 2

## 2019-02-11 MED ORDER — METOPROLOL TARTRATE 5 MG/5ML IV SOLN
5.0000 mg | Freq: Once | INTRAVENOUS | Status: AC
  Administered 2019-02-11: 21:00:00 5 mg via INTRAVENOUS
  Filled 2019-02-11: qty 5

## 2019-02-11 NOTE — ED Provider Notes (Signed)
[facility] EMERGENCY DEPARTMENT Provider Note   CSN: [id number] Arrival date & time: 02/11/19  1823     History Chief Complaint  Patient presents with  . Abdominal Pain    Derek Foster is a 84 y.o. male.  Patient states he has been vomiting all day today.  He feels weak.  Patient was diagnosed with Covid at the end of December.  The history is provided by the patient. No language interpreter was used.  Emesis Severity:  Moderate Timing:  Constant Quality:  Unable to specify Able to tolerate:  Liquids Progression:  Unchanged Chronicity:  New Recent urination:  Decreased Context: not post-tussive   Associated symptoms: no abdominal pain, no cough, no diarrhea and no headaches        Past Medical History:  Diagnosis Date  . Acute myocardial infarction, unspecified site, episode of care unspecified   . Edema   . HTN (hypertension)   . Mixed hyperlipidemia   . Unspecified hypothyroidism     Patient Active Problem List   Diagnosis Date Noted  . MIXED HYPERLIPIDEMIA 09/10/2009  . EDEMA 02/27/2009  . HYPOTHYROIDISM 02/26/2009  . HYPERTENSION 02/26/2009  . MYOCARDIAL INFARCTION 02/26/2009    Past Surgical History:  Procedure Laterality Date  . CHOLECYSTECTOMY    . CORONARY STENT PLACEMENT    . HERNIA REPAIR         Family History  Problem Relation Age of Onset  . Heart attack Father   . Lung cancer Sister     Social History   Tobacco Use  . Smoking status: Former Smoker    Quit date: 01/20/1957    Years since quitting: 62.1  . Smokeless tobacco: Never Used  Substance Use Topics  . Alcohol use: No  . Drug use: Never    Home Medications Prior to Admission medications   Medication Sig Start Date End Date Taking? Authorizing Provider  amLODipine-olmesartan (AZOR) 5-20 MG per tablet Take 1 tablet by mouth daily.    [provider]  Ascorbic Acid (VITAMIN C) 500 MG tablet Take 500 mg by mouth daily.      [provider]  aspirin 81  MG tablet Take 81 mg by mouth daily.      [provider]  bisoprolol-hydrochlorothiazide (ZIAC) 5-6.25 MG per tablet Take 1 tablet by mouth daily.      [provider]  Coenzyme Q10 200 MG capsule Take 200 mg by mouth daily.      [provider]  fish oil-omega-3 fatty acids 1000 MG capsule Take 2 g by mouth daily.      [provider]  lansoprazole (PREVACID SOLUTAB) 30 MG disintegrating tablet Take 30 mg by mouth daily.      [provider]  Multiple Vitamin (MULTIVITAMIN) tablet Take 1 tablet by mouth daily.      [provider]  nitroGLYCERIN (NITROSTAT) 0.4 MG SL tablet Place 1 tablet (0.4 mg total) under the tongue every 5 (five) minutes as needed. 06/16/12   Josue Hector, MD  peg 3350 powder (MOVIPREP) 100 G SOLR Take 1 kit (200 g total) by mouth once. 11/08/12   Setzer, Rona Ravens, NP  pravastatin (PRAVACHOL) 10 MG tablet Take 1 tablet (10 mg total) by mouth every evening. 09/20/13 09/20/14  Josue Hector, MD  prednisoLONE acetate (PRED FORTE) 1 % ophthalmic suspension  05/21/12   [provider]  simvastatin (ZOCOR) 20 MG tablet Take 1 tablet (20 mg total) by mouth every evening. 02/25/11  04/02/11  Josue Hector, MD    Allergies    Patient has no known allergies.  Review of Systems   Review of Systems  Constitutional: Negative for appetite change and fatigue.  HENT: Negative for congestion, ear discharge and sinus pressure.   Eyes: Negative for discharge.  Respiratory: Negative for cough.   Cardiovascular: Negative for chest pain.  Gastrointestinal: Positive for vomiting. Negative for abdominal pain and diarrhea.  Genitourinary: Negative for frequency and hematuria.  Musculoskeletal: Negative for back pain.  Skin: Negative for rash.  Neurological: Negative for seizures and headaches.  Psychiatric/Behavioral: Negative for hallucinations.    Physical Exam Updated Vital Signs BP 114/88   Pulse 100   Temp 97.6 F (36.4  C) (Oral)   Resp 16   Ht 6' 1"  (1.854 m)   Wt 128.4 kg   SpO2 99%   BMI 37.34 kg/m   Physical Exam Vitals and nursing note reviewed.  Constitutional:      Appearance: He is well-developed.  HENT:     Head: Normocephalic.     Nose: Nose normal.  Eyes:     General: No scleral icterus.    Conjunctiva/sclera: Conjunctivae normal.  Neck:     Thyroid: No thyromegaly.  Cardiovascular:     Heart sounds: No murmur. No friction rub. No gallop.      Comments: Rapid irregular rate Pulmonary:     Breath sounds: No stridor. No wheezing or rales.  Chest:     Chest wall: No tenderness.  Abdominal:     General: There is no distension.     Tenderness: There is no abdominal tenderness. There is no rebound.  Musculoskeletal:        General: Normal range of motion.     Cervical back: Neck supple.  Lymphadenopathy:     Cervical: No cervical adenopathy.  Skin:    Findings: No erythema or rash.  Neurological:     Mental Status: He is oriented to person, place, and time.     Motor: No abnormal muscle tone.     Coordination: Coordination normal.  Psychiatric:        Behavior: Behavior normal.     ED Results / Procedures / Treatments   Labs (all labs ordered are listed, but only abnormal results are displayed) Labs Reviewed  RESPIRATORY PANEL BY RT PCR (FLU A&B, COVID) - Abnormal; Notable for the following components:      Result Value   SARS Coronavirus 2 by RT PCR POSITIVE (*)    All other components within normal limits  CBC WITH DIFFERENTIAL/PLATELET - Abnormal; Notable for the following components:   WBC 12.9 (*)    Neutro Abs 10.1 (*)    Monocytes Absolute 1.4 (*)    Abs Immature Granulocytes 0.09 (*)    All other components within normal limits  COMPREHENSIVE METABOLIC PANEL - Abnormal; Notable for the following components:   Glucose, Bld 131 (*)    BUN 43 (*)    Creatinine, Ser 1.71 (*)    Calcium 8.8 (*)    Albumin 3.4 (*)    GFR calc non Af Amer 36 (*)    GFR calc  Af Amer 42 (*)    All other components within normal limits  TSH  URINALYSIS, ROUTINE W REFLEX MICROSCOPIC  TROPONIN I (HIGH SENSITIVITY)  TROPONIN I (HIGH SENSITIVITY)    EKG None  Radiology DG Chest Portable 1 View  Result Date: 02/11/2019 CLINICAL DATA:  84 year old male with shortness of breath. EXAM:  PORTABLE CHEST 1 VIEW COMPARISON:  Chest radiograph dated 01/26/2009. FINDINGS: There is cardiomegaly with mild vascular congestion. Minimal left mid to lower lung field subpleural hazy densities may represent atelectasis. Developing atypical infection is not excluded. Clinical correlation is recommended. No focal consolidation, pleural effusion, pneumothorax. No acute osseous pathology. IMPRESSION: 1. Cardiomegaly with mild vascular congestion. 2. Left mid to lower lung field faint atelectasis versus atypical infection. Clinical correlation is recommended. No focal consolidation. Electronically Signed   By: Anner Crete M.D.   On: 02/11/2019 19:20    Procedures Procedures (including critical care time)  Medications Ordered in ED Medications  diltiazem (CARDIZEM) 125 mg in dextrose 5% 125 mL (1 mg/mL) infusion (15 mg/hr Intravenous Rate/Dose Change 02/11/19 2001)  heparin bolus via infusion 4,000 Units (has no administration in time range)  heparin ADULT infusion 100 units/mL (25000 units/248m sodium chloride 0.45%) (has no administration in time range)  sodium chloride 0.9 % bolus 1,000 mL (0 mLs Intravenous Stopped 02/11/19 2000)  diltiazem (CARDIZEM) injection 10 mg (10 mg Intravenous Bolus 02/11/19 1841)  metoprolol tartrate (LOPRESSOR) injection 2.5 mg (2.5 mg Intravenous Given 02/11/19 1917)  metoprolol tartrate (LOPRESSOR) injection 5 mg (5 mg Intravenous Given 02/11/19 1950)  sodium chloride 0.9 % bolus 500 mL (0 mLs Intravenous Stopped 02/11/19 2044)  diltiazem (CARDIZEM) injection 10 mg (10 mg Intravenous Bolus 02/11/19 2001)  sodium chloride 0.9 % bolus 1,000 mL (1,000 mLs  Intravenous New Bag/Given 02/11/19 2043)  metoprolol tartrate (LOPRESSOR) injection 5 mg (5 mg Intravenous Given 02/11/19 2044)    ED Course  I have reviewed the triage vital signs and the nursing notes.  Pertinent labs & imaging results that were available during my care of the patient were reviewed by me and considered in my medical decision making (see chart for details).    MDM Rules/Calculators/A&P                      CRITICAL CARE Performed by: JMilton FergusonTotal critical care time:35 minutes Critical care time was exclusive of separately billable procedures and treating other patients. Critical care was necessary to treat or prevent imminent or life-threatening deterioration. Critical care was time spent personally by me on the following activities: development of treatment plan with patient and/or surrogate as well as nursing, discussions with consultants, evaluation of patient's response to treatment, examination of patient, obtaining history from patient or surrogate, ordering and performing treatments and interventions, ordering and review of laboratory studies, ordering and review of radiographic studies, pulse oximetry and re-evaluation of patient's condition. The patient is in rapid atrial fib.  He was given 20 of Cardizem and is on a drip of 15 mg.  He also has been given 7 and half milligrams of Lopressor.  Patient has been given 1500 cc of fluid.  Cardiology was called and they recommended continued fluids more Lopressor.  If patient becomes hypotensive then cardiovert.  One other option is amiodarone.  Patient will be admitted to medicine Final Clinical Impression(s) / ED Diagnoses Final diagnoses:  Atrial fibrillation with RVR ([facility]    Rx / DC Orders ED Discharge Orders    None       ZMilton Ferguson MD 02/11/19 2055

## 2019-02-11 NOTE — Progress Notes (Signed)
ANTICOAGULATION CONSULT NOTE - Initial Consult  Pharmacy Consult for Heparin  Indication: atrial fibrillation  Allergies  Allergen Reactions  . Indomethacin Anaphylaxis    But tolerates ibuprofen, aleve  . Iodine-131 Anaphylaxis    Patient Measurements: Height: 6\' 1"  (185.4 cm) Weight: 283 lb (128.4 kg) IBW/kg (Calculated) : 79.9 Heparin Dosing Weight: 109 kg  Vital Signs: Temp: 97.6 F (36.4 C) (01/22 1832) Temp Source: Oral (01/22 1832) BP: 130/91 (01/22 2100) Pulse Rate: 100 (01/22 2045)  Labs: Recent Labs    02/11/19 1836 02/11/19 2045  HGB 13.8  --   HCT 42.5  --   PLT 192  --   CREATININE 1.71*  --   TROPONINIHS 10 10    Estimated Creatinine Clearance: 46.8 mL/min (A) (by C-G formula based on SCr of 1.71 mg/dL (H)).   Medical History: Past Medical History:  Diagnosis Date  . Acute myocardial infarction, unspecified site, episode of care unspecified   . Edema   . HTN (hypertension)   . Mixed hyperlipidemia   . Unspecified hypothyroidism     Medications:  See electronic med rec  Assessment: 84 y.o. M presents with abd pain. In ED, noted to have afib with RVR (new diagnosis). ED provider started heparin with 4000 unit bolus and 1000 units/hr gtt (started about 1 hour ago). Pharmacy consulted to dose heparin.  CBC ok on admission. No AC PTA.  Goal of Therapy:  Heparin level 0.3-0.7 units/ml Monitor platelets by anticoagulation protocol: Yes   Plan:  Increase heparin gtt to 1500 units/hr Will f/u 8hr heparin level Daily heparin level and CBC  Sherlon Handing, PharmD, BCPS Please see amion for complete clinical pharmacist phone list 02/11/2019,10:00 PM

## 2019-02-11 NOTE — ED Notes (Signed)
EMS gave 4mg  of Zofran

## 2019-02-11 NOTE — ED Triage Notes (Signed)
PT brought in by [facility] from home for complaint of abdominal pain with n/v x1 day. EMS reports heart rate of 140s in route. PT states he was diagnosed with Covid the last week of December 2020.

## 2019-02-11 NOTE — H&P (Addendum)
TRH H&P   Patient Demographics:    Derek Foster, is a 84 y.o. male  MRN: [id number]   DOB - 1935/08/02  Admit Date - 02/11/2019  Outpatient Primary MD for the patient is Celene Squibb, MD  Referring MD/NP/PA: Dr Roderic Palau  Patient coming from: Home  Chief Complaint  Patient presents with  . Abdominal Pain      HPI:    Derek Foster  is a 84 y.o. male, with past medical history of CAD, status post stent in 2011, hypertension, hyperlipidemia, GERD, hypothyroidism, patient presents with complaints of abdominal pain, nausea, vomiting for last 24 hours, patient report overall generalized weakness, since his recent Covid diagnosis 12/30,(patient's wife passed away from COVID-19 2 weeks ago), patient denies any chest pain, fever, chills, hemoptysis, diarrhea, as well patient reports he has generalized weakness since his COVID-19 diagnosis, has been mainly using his wheelchair, as well he reports poor appetite, did not require admission or any treatment for his COVID-19. - in ED patient was noted to be in A. fib with RVR, which is new diagnosis, where he required Cardizem drip, initial heart rate 162, as well patient was started on heparin GTT, patient on baby aspirin daily for history of CAD, patient was afebrile, ED, but mild leukocytosis of 12.9, he denies any dysuria or cough, patient creatinine was noted to be elevated at 1.7, no recent baseline to compare, heart rate has improved in the 130s with Cardizem drip, I was called to admit.    Review of systems:    In addition to the HPI above,  No Fever-chills, No Headache, No changes with Vision or hearing, No problems swallowing food or Liquids, No Chest pain, Cough, but he reports shortness of Breath, does report palpitation He reports  Abdominal pain, No Nausea or Vommitting, Bowel movements are regular, No Blood in stool or Urine, No  dysuria, No new skin rashes or bruises, No new joints pains-aches,  No new focal weakness, tingling, numbness in any extremity, but he does report generalized weakness No recent weight gain or loss, No polyuria, polydypsia or polyphagia, No significant Mental Stressors.  A full 10 point Review of Systems was done, except as stated above, all other Review of Systems were negative.   With Past History of the following :    Past Medical History:  Diagnosis Date  . Acute myocardial infarction, unspecified site, episode of care unspecified   . Edema   . HTN (hypertension)   . Mixed hyperlipidemia   . Unspecified hypothyroidism       Past Surgical History:  Procedure Laterality Date  . CHOLECYSTECTOMY    . CORONARY STENT PLACEMENT    . HERNIA REPAIR        Social History:     Social History   Tobacco Use  . Smoking status: Former Smoker    Quit date: 01/20/1957  Years since quitting: 62.1  . Smokeless tobacco: Never Used  Substance Use Topics  . Alcohol use: No       Family History :     Family History  Problem Relation Age of Onset  . Heart attack Father   . Lung cancer Sister      Home Medications:   Prior to Admission medications   Medication Sig Start Date End Date Taking? Authorizing Provider  amLODipine (NORVASC) 5 MG tablet Take 5 mg by mouth daily.  12/31/18  Yes [provider]  Ascorbic Acid (VITAMIN C) 500 MG tablet Take 500 mg by mouth daily.     Yes [provider]  aspirin 81 MG tablet Take 81 mg by mouth daily.     Yes [provider]  benzonatate (TESSALON) 100 MG capsule Take 100 mg by mouth 3 (three) times daily as needed for cough.   Yes [provider]  bisoprolol-hydrochlorothiazide (ZIAC) 2.5-6.25 MG tablet Take 1 tablet by mouth daily.    Yes [provider]  Coenzyme Q10 200 MG capsule Take 200 mg by mouth daily.     Yes [provider]  diclofenac Sodium (VOLTAREN) 1 % GEL Apply 2  g topically 4 (four) times daily as needed (for pain).  01/03/19  Yes [provider]  fish oil-omega-3 fatty acids 1000 MG capsule Take 2 g by mouth daily.     Yes [provider]  irbesartan (AVAPRO) 300 MG tablet Take 300 mg by mouth daily.   Yes [provider]  lansoprazole (PREVACID SOLUTAB) 30 MG disintegrating tablet Take 30 mg by mouth daily.     Yes [provider]  Multiple Vitamin (MULTIVITAMIN) tablet Take 1 tablet by mouth daily.     Yes [provider]  pravastatin (PRAVACHOL) 10 MG tablet Take 1 tablet (10 mg total) by mouth every evening. 09/20/13 02/11/19 Yes Josue Hector, MD  predniSONE (DELTASONE) 20 MG tablet Take 40 mg by mouth daily. 5 day course starting 02/03/2019 02/03/19  Yes [provider]  promethazine (PHENERGAN) 25 MG tablet Take 25 mg by mouth every 6 (six) hours as needed for nausea or vomiting.  12/20/18  Yes [provider]  nitroGLYCERIN (NITROSTAT) 0.4 MG SL tablet Place 1 tablet (0.4 mg total) under the tongue every 5 (five) minutes as needed. 06/16/12   Josue Hector, MD  simvastatin (ZOCOR) 20 MG tablet Take 1 tablet (20 mg total) by mouth every evening. 02/25/11 04/02/11  Josue Hector, MD     Allergies:     Allergies  Allergen Reactions  . Indomethacin Anaphylaxis    But tolerates ibuprofen, aleve  . Iodine-131 Anaphylaxis     Physical Exam:   Vitals  Blood pressure (!) 130/91, pulse 100, temperature 97.6 F (36.4 C), temperature source Oral, resp. rate 17, height 6\' 1"  (1.854 m), weight 128.4 kg, SpO2 99 %.   1. General elderly male lying in bed in NAD,    2. Normal affect and insight, Not Suicidal or Homicidal, Awake Alert, Oriented X 3.  3. No F.N deficits, ALL C.Nerves Intact, Strength 5/5 all 4 extremities, Sensation intact all 4 extremities, Plantars down going.  4. Ears and Eyes appear Normal, Conjunctivae clear, PERRLA. Moist Oral Mucosa.  5. Supple Neck, No JVD, No  cervical lymphadenopathy appriciated, No Carotid Bruits.  6. Symmetrical Chest wall movement, Good air movement bilaterally, CTAB.  7. IRR IRR, No Gallops, Rubs or Murmurs, No Parasternal Heave.  8.  Positive Bowel Sounds, Abdomen Soft, No tenderness, No organomegaly appriciated,No rebound -guarding or rigidity.  9.  No Cyanosis, Normal Skin Turgor, No Skin Rash or Bruise.  10. Good muscle tone,  joints appear normal , no effusions, Normal ROM.  11. No Palpable Lymph Nodes in Neck or Axillae     Data Review:    CBC Recent Labs  Lab 02/11/19 1836  WBC 12.9*  HGB 13.8  HCT 42.5  PLT 192  MCV 92.6  MCH 30.1  MCHC 32.5  RDW 13.5  LYMPHSABS 1.4  MONOABS 1.4*  EOSABS 0.0  BASOSABS 0.0   ------------------------------------------------------------------------------------------------------------------  Chemistries  Recent Labs  Lab 02/11/19 1836  NA 145  K 4.7  CL 101  CO2 30  GLUCOSE 131*  BUN 43*  CREATININE 1.71*  CALCIUM 8.8*  AST 19  ALT 22  ALKPHOS 78  BILITOT 0.7   ------------------------------------------------------------------------------------------------------------------ estimated creatinine clearance is 46.8 mL/min (A) (by C-G formula based on SCr of 1.71 mg/dL (H)). ------------------------------------------------------------------------------------------------------------------ No results for input(s): TSH, T4TOTAL, T3FREE, THYROIDAB in the last 72 hours.  Invalid input(s): FREET3  Coagulation profile No results for input(s): INR, PROTIME in the last 168 hours. ------------------------------------------------------------------------------------------------------------------- No results for input(s): DDIMER in the last 72 hours. -------------------------------------------------------------------------------------------------------------------  Cardiac Enzymes No results for input(s): CKMB, TROPONINI, MYOGLOBIN in the last 168  hours.  Invalid input(s): CK ------------------------------------------------------------------------------------------------------------------ No results found for: BNP   ---------------------------------------------------------------------------------------------------------------  Urinalysis No results found for: COLORURINE, APPEARANCEUR, LABSPEC, [place], GLUCOSEU, HGBUR, BILIRUBINUR, KETONESUR, PROTEINUR, UROBILINOGEN, NITRITE, LEUKOCYTESUR  ----------------------------------------------------------------------------------------------------------------   Imaging Results:    DG Chest Portable 1 View  Result Date: 02/11/2019 CLINICAL DATA:  84 year old male with shortness of breath. EXAM: PORTABLE CHEST 1 VIEW COMPARISON:  Chest radiograph dated 01/26/2009. FINDINGS: There is cardiomegaly with mild vascular congestion. Minimal left mid to lower lung field subpleural hazy densities may represent atelectasis. Developing atypical infection is not excluded. Clinical correlation is recommended. No focal consolidation, pleural effusion, pneumothorax. No acute osseous pathology. IMPRESSION: 1. Cardiomegaly with mild vascular congestion. 2. Left mid to lower lung field faint atelectasis versus atypical infection. Clinical correlation is recommended. No focal consolidation. Electronically Signed   By: Anner Crete M.D.   On: 02/11/2019 19:20    My personal review of EKG: Rhythm A fib with RVR, Rate  162 /min, QTc 480 , no Acute ST changes   Assessment & Plan:    Active Problems:   Mixed hyperlipidemia   Essential hypertension   CAD (coronary artery disease)   Atrial fibrillation with RVR (HCC)   A. fib with RVR -New onset, heart rate in 160s on presentation, currently on Cardizem drip, heart rate has improved, currently in the 130s to 140s, blood pressure is stable, will titrate Cardizem drip for optimal heart rate control, will give 1 dose of digoxin as welL. -Started on heparin GTT  for anticoagulation, can transition to DOAC once stable. -Obtain 2D echo. -check TSH(given patient known history of hyperthyroidism) -will have morning team consult cardiology  AKI -Creatinine of 1.7, continue with gentle hydration.  History of CAD -Status post stent in 2011, hold aspirin now he is on full anticoagulation.  COVID-19 infection -Per discussion with the family, patient tested 01/19/2019 at PCP office Dr. Allyn Kenner Z, which makes him> 21 days from initial infection, at this point family cannot produce these results, so we will keep him on airborne/contact isolation given he remains positive this admission, isolation can be discontinued once test results were obtained from PCP office. -  Patient wife passed passed away 2 weeks ago at [facility] because of COVID-19.  Abdominal pain/vomiting -No recurrence in ED, will obtain KUB  Hypertension -Hold home medications given patient currently on Cardizem drip.  Hyperlipidemia -Continue with home dose statin  GERD - Continue with PPI   DVT Prophylaxis Heparin GTT  AM Labs Ordered, also please review Full Orders  Family Communication: Admission, patients condition and plan of care including tests being ordered have been discussed with the patient and daughter-in-law Malachy Mood who indicate understanding and agree with the plan and Code Status.  Code Status DNR   Likely DC to home  Condition GUARDED    Consults called: None  Admission status: Inpatient  Time spent in minutes : 65 minutes   Phillips Climes M.D on 02/11/2019 at 9:29 PM  Between 7am to 7pm - Pager - [phone]. After 7pm go to [email or web address] - password [facility]  [facility] - Office  [phone]

## 2019-02-11 NOTE — Progress Notes (Signed)
PHARMACIST - PHYSICIAN ORDER COMMUNICATION  CONCERNING: P&T Medication Policy on Herbal Medications  DESCRIPTION:  This patient's order for:  Coenzyme Q10  has been noted.  This product(s) is classified as an "herbal" or natural product. Due to a lack of definitive safety studies or FDA approval, nonstandard manufacturing practices, plus the potential risk of unknown drug-drug interactions while on inpatient medications, the Pharmacy and Therapeutics Committee does not permit the use of "herbal" or natural products of this type within [facility].   ACTION TAKEN: The pharmacy department is unable to verify this order at this time and your patient has been informed of this safety policy. Please reevaluate patient's clinical condition at discharge and address if the herbal or natural product(s) should be resumed at that time.  Sherlon Handing, PharmD, BCPS Please see amion for complete clinical pharmacist phone list 02/11/2019 11:14 PM

## 2019-02-12 ENCOUNTER — Other Ambulatory Visit (HOSPITAL_COMMUNITY): Payer: PPO

## 2019-02-12 ENCOUNTER — Inpatient Hospital Stay (HOSPITAL_COMMUNITY): Payer: PPO

## 2019-02-12 ENCOUNTER — Encounter (HOSPITAL_COMMUNITY): Payer: Self-pay | Admitting: Internal Medicine

## 2019-02-12 DIAGNOSIS — J189 Pneumonia, unspecified organism: Secondary | ICD-10-CM | POA: Diagnosis not present

## 2019-02-12 DIAGNOSIS — D696 Thrombocytopenia, unspecified: Secondary | ICD-10-CM | POA: Diagnosis not present

## 2019-02-12 DIAGNOSIS — Z6835 Body mass index (BMI) 35.0-35.9, adult: Secondary | ICD-10-CM | POA: Diagnosis not present

## 2019-02-12 DIAGNOSIS — N390 Urinary tract infection, site not specified: Secondary | ICD-10-CM | POA: Diagnosis not present

## 2019-02-12 DIAGNOSIS — I10 Essential (primary) hypertension: Secondary | ICD-10-CM

## 2019-02-12 DIAGNOSIS — E876 Hypokalemia: Secondary | ICD-10-CM | POA: Diagnosis not present

## 2019-02-12 DIAGNOSIS — I252 Old myocardial infarction: Secondary | ICD-10-CM | POA: Diagnosis not present

## 2019-02-12 DIAGNOSIS — D649 Anemia, unspecified: Secondary | ICD-10-CM | POA: Diagnosis not present

## 2019-02-12 DIAGNOSIS — K5651 Intestinal adhesions [bands], with partial obstruction: Secondary | ICD-10-CM | POA: Diagnosis not present

## 2019-02-12 DIAGNOSIS — Z8616 Personal history of covid-19: Secondary | ICD-10-CM | POA: Diagnosis not present

## 2019-02-12 DIAGNOSIS — Z66 Do not resuscitate: Secondary | ICD-10-CM | POA: Diagnosis not present

## 2019-02-12 DIAGNOSIS — I2510 Atherosclerotic heart disease of native coronary artery without angina pectoris: Secondary | ICD-10-CM

## 2019-02-12 DIAGNOSIS — B961 Klebsiella pneumoniae [K. pneumoniae] as the cause of diseases classified elsewhere: Secondary | ICD-10-CM | POA: Diagnosis not present

## 2019-02-12 DIAGNOSIS — N179 Acute kidney failure, unspecified: Secondary | ICD-10-CM

## 2019-02-12 DIAGNOSIS — I480 Paroxysmal atrial fibrillation: Secondary | ICD-10-CM | POA: Diagnosis not present

## 2019-02-12 DIAGNOSIS — I2583 Coronary atherosclerosis due to lipid rich plaque: Secondary | ICD-10-CM

## 2019-02-12 DIAGNOSIS — N3289 Other specified disorders of bladder: Secondary | ICD-10-CM | POA: Diagnosis not present

## 2019-02-12 DIAGNOSIS — D62 Acute posthemorrhagic anemia: Secondary | ICD-10-CM | POA: Diagnosis not present

## 2019-02-12 DIAGNOSIS — I4891 Unspecified atrial fibrillation: Secondary | ICD-10-CM

## 2019-02-12 DIAGNOSIS — E782 Mixed hyperlipidemia: Secondary | ICD-10-CM | POA: Diagnosis not present

## 2019-02-12 DIAGNOSIS — K430 Incisional hernia with obstruction, without gangrene: Secondary | ICD-10-CM | POA: Diagnosis not present

## 2019-02-12 DIAGNOSIS — E669 Obesity, unspecified: Secondary | ICD-10-CM | POA: Diagnosis not present

## 2019-02-12 DIAGNOSIS — T8383XA Hemorrhage of genitourinary prosthetic devices, implants and grafts, initial encounter: Secondary | ICD-10-CM | POA: Diagnosis not present

## 2019-02-12 DIAGNOSIS — I4892 Unspecified atrial flutter: Secondary | ICD-10-CM | POA: Diagnosis not present

## 2019-02-12 DIAGNOSIS — N320 Bladder-neck obstruction: Secondary | ICD-10-CM | POA: Diagnosis not present

## 2019-02-12 DIAGNOSIS — E0590 Thyrotoxicosis, unspecified without thyrotoxic crisis or storm: Secondary | ICD-10-CM | POA: Diagnosis not present

## 2019-02-12 DIAGNOSIS — K56609 Unspecified intestinal obstruction, unspecified as to partial versus complete obstruction: Secondary | ICD-10-CM | POA: Diagnosis present

## 2019-02-12 HISTORY — DX: Acute kidney failure, unspecified: N17.9

## 2019-02-12 LAB — CBC
HCT: 39.1 % (ref 39.0–52.0)
Hemoglobin: 12.3 g/dL — ABNORMAL LOW (ref 13.0–17.0)
MCH: 29.2 pg (ref 26.0–34.0)
MCHC: 31.5 g/dL (ref 30.0–36.0)
MCV: 92.9 fL (ref 80.0–100.0)
Platelets: 178 10*3/uL (ref 150–400)
RBC: 4.21 MIL/uL — ABNORMAL LOW (ref 4.22–5.81)
RDW: 13.5 % (ref 11.5–15.5)
WBC: 10.6 10*3/uL — ABNORMAL HIGH (ref 4.0–10.5)
nRBC: 0 % (ref 0.0–0.2)

## 2019-02-12 LAB — COMPREHENSIVE METABOLIC PANEL
ALT: 20 U/L (ref 0–44)
AST: 15 U/L (ref 15–41)
Albumin: 3 g/dL — ABNORMAL LOW (ref 3.5–5.0)
Alkaline Phosphatase: 69 U/L (ref 38–126)
Anion gap: 13 (ref 5–15)
BUN: 36 mg/dL — ABNORMAL HIGH (ref 8–23)
CO2: 29 mmol/L (ref 22–32)
Calcium: 8.4 mg/dL — ABNORMAL LOW (ref 8.9–10.3)
Chloride: 101 mmol/L (ref 98–111)
Creatinine, Ser: 1.46 mg/dL — ABNORMAL HIGH (ref 0.61–1.24)
GFR calc Af Amer: 51 mL/min — ABNORMAL LOW (ref >60)
GFR calc non Af Amer: 44 mL/min — ABNORMAL LOW (ref >60)
Glucose, Bld: 138 mg/dL — ABNORMAL HIGH (ref 70–99)
Potassium: 3.9 mmol/L (ref 3.5–5.1)
Sodium: 143 mmol/L (ref 135–145)
Total Bilirubin: 0.8 mg/dL (ref 0.3–1.2)
Total Protein: 6.1 g/dL — ABNORMAL LOW (ref 6.5–8.1)

## 2019-02-12 LAB — HEPARIN LEVEL (UNFRACTIONATED)
Heparin Unfractionated: 0.54 IU/mL (ref 0.30–0.70)
Heparin Unfractionated: 0.83 IU/mL — ABNORMAL HIGH (ref 0.30–0.70)
Heparin Unfractionated: 0.61 IU/mL (ref 0.30–0.70)

## 2019-02-12 LAB — T4, FREE: Free T4: 1.76 ng/dL — ABNORMAL HIGH (ref 0.61–1.12)

## 2019-02-12 MED ORDER — GUAIFENESIN 100 MG/5ML PO SOLN
15.0000 mL | Freq: Four times a day (QID) | ORAL | Status: DC
  Administered 2019-02-13 – 2019-03-08 (×82): 300 mg
  Filled 2019-02-12 (×6): qty 15
  Filled 2019-02-12: qty 5
  Filled 2019-02-12 (×3): qty 15
  Filled 2019-02-12: qty 5
  Filled 2019-02-12: qty 10
  Filled 2019-02-12 (×5): qty 15
  Filled 2019-02-12: qty 10
  Filled 2019-02-12 (×5): qty 15
  Filled 2019-02-12: qty 5
  Filled 2019-02-12: qty 15
  Filled 2019-02-12: qty 5
  Filled 2019-02-12 (×8): qty 15
  Filled 2019-02-12: qty 5
  Filled 2019-02-12: qty 10
  Filled 2019-02-12: qty 25
  Filled 2019-02-12 (×7): qty 15
  Filled 2019-02-12 (×2): qty 5
  Filled 2019-02-12 (×4): qty 15
  Filled 2019-02-12: qty 5
  Filled 2019-02-12: qty 15
  Filled 2019-02-12: qty 5
  Filled 2019-02-12: qty 75
  Filled 2019-02-12: qty 15
  Filled 2019-02-12: qty 5
  Filled 2019-02-12: qty 15
  Filled 2019-02-12: qty 5
  Filled 2019-02-12 (×3): qty 15
  Filled 2019-02-12: qty 5
  Filled 2019-02-12 (×17): qty 15
  Filled 2019-02-12: qty 5
  Filled 2019-02-12 (×2): qty 15
  Filled 2019-02-12: qty 5
  Filled 2019-02-12 (×2): qty 15
  Filled 2019-02-12: qty 5
  Filled 2019-02-12: qty 10
  Filled 2019-02-12 (×3): qty 15

## 2019-02-12 MED ORDER — SODIUM CHLORIDE 0.9 % IV SOLN: INTRAVENOUS | Status: DC

## 2019-02-12 MED ORDER — GUAIFENESIN ER 600 MG PO TB12
600.0000 mg | ORAL_TABLET | Freq: Two times a day (BID) | ORAL | Status: DC
  Filled 2019-02-12: qty 1

## 2019-02-12 MED ORDER — METOPROLOL TARTRATE 5 MG/5ML IV SOLN
5.0000 mg | Freq: Three times a day (TID) | INTRAVENOUS | Status: DC
  Administered 2019-02-12 – 2019-02-13 (×3): 5 mg via INTRAVENOUS
  Filled 2019-02-12 (×3): qty 5

## 2019-02-12 MED ORDER — TRAZODONE HCL 50 MG PO TABS
50.0000 mg | ORAL_TABLET | Freq: Once | ORAL | Status: AC
  Administered 2019-02-12: 50 mg via ORAL
  Filled 2019-02-12 (×2): qty 1

## 2019-02-12 MED ORDER — AMIODARONE HCL IN DEXTROSE 360-4.14 MG/200ML-% IV SOLN
60.0000 mg/h | INTRAVENOUS | Status: AC
  Administered 2019-02-12: 60 mg/h via INTRAVENOUS
  Filled 2019-02-12: qty 200

## 2019-02-12 MED ORDER — AMIODARONE HCL IN DEXTROSE 360-4.14 MG/200ML-% IV SOLN
30.0000 mg/h | INTRAVENOUS | Status: DC
  Administered 2019-02-12 – 2019-02-19 (×15): 30 mg/h via INTRAVENOUS
  Filled 2019-02-12 (×24): qty 200

## 2019-02-12 MED ORDER — GUAIFENESIN-DM 100-10 MG/5ML PO SYRP
15.0000 mL | ORAL_SOLUTION | ORAL | Status: DC | PRN
  Administered 2019-02-12 – 2019-03-10 (×9): 15 mL
  Filled 2019-02-12 (×9): qty 15

## 2019-02-12 MED ORDER — DIGOXIN 0.25 MG/ML IJ SOLN
0.2500 mg | Freq: Every day | INTRAMUSCULAR | Status: DC
  Administered 2019-02-13 – 2019-02-15 (×3): 0.25 mg via INTRAVENOUS
  Filled 2019-02-12 (×3): qty 2

## 2019-02-12 MED ORDER — DIGOXIN 0.25 MG/ML IJ SOLN
0.2500 mg | Freq: Once | INTRAMUSCULAR | Status: AC
  Administered 2019-02-12: 0.25 mg via INTRAVENOUS
  Filled 2019-02-12: qty 2

## 2019-02-12 MED ORDER — MAGNESIUM SULFATE 2 GM/50ML IV SOLN
2.0000 g | Freq: Once | INTRAVENOUS | Status: AC
  Administered 2019-02-12: 2 g via INTRAVENOUS
  Filled 2019-02-12: qty 50

## 2019-02-12 MED ORDER — SODIUM CHLORIDE 0.9 % IV SOLN: INTRAVENOUS | Status: AC

## 2019-02-12 MED ORDER — AMIODARONE LOAD VIA INFUSION
150.0000 mg | Freq: Once | INTRAVENOUS | Status: AC
  Administered 2019-02-12: 150 mg via INTRAVENOUS
  Filled 2019-02-12: qty 83.34

## 2019-02-12 MED ORDER — METOPROLOL TARTRATE 5 MG/5ML IV SOLN
5.0000 mg | Freq: Once | INTRAVENOUS | Status: AC
  Administered 2019-02-12: 5 mg via INTRAVENOUS
  Filled 2019-02-12: qty 5

## 2019-02-12 NOTE — Progress Notes (Signed)
First dose of contrast given via NGT for CT scan at this time

## 2019-02-12 NOTE — ED Notes (Signed)
cardizem decreased to 10 mg BPm 103/59

## 2019-02-12 NOTE — Consult Note (Addendum)
[facility] Surgery Consult Note  Derek Foster [birth year]  [id number].    Requesting MD: Domenic Polite Chief Complaint: Abdominal pain with nausea and vomiting/SVT/Covid positive Reason for Consult: SBO   HPI: Patient is an 84 year old male who presented to the ED at [facility] yesterday.  He reported he been vomiting all day he felt weak.  He has been having nausea and vomiting since 02/10/19. He was diagnosed with Covid at the end of 01/19/2019, and Dr. Shepard General office his PCP.  And his wife died from [place] at [facility] 2 weeks ago. His BM's have been normal for him and the last one was yesterday.  His daughter came over and checked him yesterday and saw his HR was up and made him go to the ED.  He has limited activity and primarily uses his wheelchair at home.  He felt weak, and is not eating.  Past medical history includes acute MI with stent placement, hypertension, hyperlipidemia, and hypothyroidism.  Has history of hernia repair and cholecystectomy.  On arrival to the ED he was in atrial fibrillation with RVR.  Heart rate was 162.  He was treated with a Cardizem drip placed on heparin.  Admission troponin was negative.  Repeat Covid study in ED shows he is still positive for Covid.  His [facility] passed away [facility] 2 weeks prior to this admission.  Work-up shows he is afebrile he remains tachycardic. Labs show CKD with a creatinine of 1.71, WBC 12.9.  TSH 0.026, T4 free 1.76, SARS Covid is positive.  Negative for influenza A&B.  Urinalysis is negative.  Plain abdominal film yesterday shows several mildly dilated loops measuring up to 3.4 cm in caliber concerning for small bowel obstruction.  Air was noted in the stomach, no free air was noted.  Repeat plain film today is pending.  Seen by cardiology and started on IV amiodarone.  Transferred by Medicine service for further evaluation and treatment at [facility]. We are asked to see  ROS: Review of  Systems  Constitutional: Positive for malaise/fatigue.  HENT: Negative.   Eyes: Negative.   Respiratory: Positive for cough (ongoing since COVID Dx). Negative for shortness of breath and wheezing.   Cardiovascular: Positive for palpitations (he knows of one episode of palpatations in the past, but he did not feel this and was unaware).  Gastrointestinal: Positive for nausea and vomiting. Negative for abdominal pain, blood in stool, constipation, diarrhea and melena.  Genitourinary: Negative.   Musculoskeletal: Negative.   Skin: Negative.   Neurological: Negative.   Endo/Heme/Allergies: Negative.   Psychiatric/Behavioral: Positive for depression.    Family History  Problem Relation Age of Onset  . Heart attack Father   . Lung cancer Sister     Past Medical History:  Diagnosis Date  . Acute myocardial infarction, unspecified site, episode of care unspecified   . CAD in native artery    a. cath 01/27/2009 : s/p promus DES to LAD and medical managment of 70-80% mRCA  . Edema   . HTN (hypertension)   . Mixed hyperlipidemia   . Unspecified hypothyroidism     Past Surgical History:  Procedure Laterality Date  . CHOLECYSTECTOMY    . CORONARY STENT PLACEMENT    . HERNIA REPAIR      Social History:  reports that he quit smoking about 62 years ago. He has never used smokeless tobacco. He reports that he does not drink alcohol or use drugs.  Allergies:  Allergies  Allergen Reactions  . Indomethacin Anaphylaxis    But tolerates ibuprofen, aleve  . Iodine-131 Anaphylaxis    Medications Prior to Admission  Medication Sig Dispense Refill  . amLODipine (NORVASC) 5 MG tablet Take 5 mg by mouth daily.     . Ascorbic Acid (VITAMIN C) 500 MG tablet Take 500 mg by mouth daily.      Marland Kitchen aspirin 81 MG tablet Take 81 mg by mouth daily.      . benzonatate (TESSALON) 100 MG capsule Take 100 mg by mouth 3 (three) times daily as needed for cough.    . bisoprolol-hydrochlorothiazide (ZIAC)  2.5-6.25 MG tablet Take 1 tablet by mouth daily.     . Coenzyme Q10 200 MG capsule Take 200 mg by mouth daily.      . diclofenac Sodium (VOLTAREN) 1 % GEL Apply 2 g topically 4 (four) times daily as needed (for pain).     . fish oil-omega-3 fatty acids 1000 MG capsule Take 2 g by mouth daily.      . irbesartan (AVAPRO) 300 MG tablet Take 300 mg by mouth daily.    . lansoprazole (PREVACID SOLUTAB) 30 MG disintegrating tablet Take 30 mg by mouth daily.      . Multiple Vitamin (MULTIVITAMIN) tablet Take 1 tablet by mouth daily.      . pravastatin (PRAVACHOL) 10 MG tablet Take 1 tablet (10 mg total) by mouth every evening. 15 tablet 0  . predniSONE (DELTASONE) 20 MG tablet Take 40 mg by mouth daily. 5 day course starting 02/03/2019    . promethazine (PHENERGAN) 25 MG tablet Take 25 mg by mouth every 6 (six) hours as needed for nausea or vomiting.     . nitroGLYCERIN (NITROSTAT) 0.4 MG SL tablet Place 1 tablet (0.4 mg total) under the tongue every 5 (five) minutes as needed. 25 tablet 3    Blood pressure (!) 114/50, pulse (!) 120, temperature 98.1 F (36.7 C), temperature source Oral, resp. rate 16, height 6\' 1"  (1.854 m), weight 128.4 kg, SpO2 97 %. Physical Exam: Physical Exam Constitutional:      General: He is not in acute distress.    Appearance: He is well-developed. He is obese. He is not ill-appearing, toxic-appearing or diaphoretic.  HENT:     Head: Normocephalic and atraumatic.     Mouth/Throat:     Mouth: Mucous membranes are moist.     Pharynx: Oropharynx is clear.  Eyes:     Comments: Pupils are equal  Cardiovascular:     Rate and Rhythm: Tachycardia present. Rhythm irregular.     Heart sounds: Normal heart sounds.  Pulmonary:     Effort: Pulmonary effort is normal. No respiratory distress.     Breath sounds: Normal breath sounds. No stridor. No wheezing, rhonchi or rales.  Chest:     Chest wall: No tenderness.  Abdominal:     General: Abdomen is flat. A surgical scar is  present. Bowel sounds are normal. There is no distension.     Palpations: Abdomen is soft.     Tenderness: There is no abdominal tenderness.     Hernia: A hernia is present. Hernia is present in the ventral area (ventral hernia repaired, no recurrence noted on exam.  he has had a cholecystectomy but I don't see the port sites).  Skin:    General: Skin is warm and dry.     Capillary Refill: Capillary refill takes 2 to 3 seconds.  Neurological:     General: No  focal deficit present.     Mental Status: He is alert and oriented to person, place, and time.     Cranial Nerves: Cranial nerve deficit present.  Psychiatric:        Mood and Affect: Mood normal. Mood is not anxious or depressed.        Behavior: Behavior normal.     Results for orders placed or performed during the hospital encounter of 02/11/19 (from the past 48 hour(s))  CBC with Differential/Platelet     Status: Abnormal   Collection Time: 02/11/19  6:36 PM  Result Value Ref Range   WBC 12.9 (H) 4.0 - 10.5 K/uL   RBC 4.59 4.22 - 5.81 MIL/uL   Hemoglobin 13.8 13.0 - 17.0 g/dL   HCT 42.5 39.0 - 52.0 %   MCV 92.6 80.0 - 100.0 fL   MCH 30.1 26.0 - 34.0 pg   MCHC 32.5 30.0 - 36.0 g/dL   RDW 13.5 11.5 - 15.5 %   Platelets 192 150 - 400 K/uL   nRBC 0.0 0.0 - 0.2 %   Neutrophils Relative % 78 %   Neutro Abs 10.1 (H) 1.7 - 7.7 K/uL   Lymphocytes Relative 10 %   Lymphs Abs 1.4 0.7 - 4.0 K/uL   Monocytes Relative 11 %   Monocytes Absolute 1.4 (H) 0.1 - 1.0 K/uL   Eosinophils Relative 0 %   Eosinophils Absolute 0.0 0.0 - 0.5 K/uL   Basophils Relative 0 %   Basophils Absolute 0.0 0.0 - 0.1 K/uL   Immature Granulocytes 1 %   Abs Immature Granulocytes 0.09 (H) 0.00 - 0.07 K/uL    Comment: Performed at [facility], [address] [place], [place] [address]  Comprehensive metabolic panel     Status: Abnormal   Collection Time: 02/11/19  6:36 PM  Result Value Ref Range   Sodium 145 135 - 145 mmol/L   Potassium 4.7 3.5 - 5.1  mmol/L   Chloride 101 98 - 111 mmol/L   CO2 30 22 - 32 mmol/L   Glucose, Bld 131 (H) 70 - 99 mg/dL   BUN 43 (H) 8 - 23 mg/dL   Creatinine, Ser 1.71 (H) 0.61 - 1.24 mg/dL   Calcium 8.8 (L) 8.9 - 10.3 mg/dL   Total Protein 6.8 6.5 - 8.1 g/dL   Albumin 3.4 (L) 3.5 - 5.0 g/dL   AST 19 15 - 41 U/L   ALT 22 0 - 44 U/L   Alkaline Phosphatase 78 38 - 126 U/L   Total Bilirubin 0.7 0.3 - 1.2 mg/dL   GFR calc non Af Amer 36 (L) >60 mL/min   GFR calc Af Amer 42 (L) >60 mL/min   Anion gap 14 5 - 15    Comment: Performed at [facility], [address], [place], [place] [address]  Troponin I (High Sensitivity)     Status: None   Collection Time: 02/11/19  6:36 PM  Result Value Ref Range   Troponin I (High Sensitivity) 10 <18 ng/L    Comment: (NOTE) Elevated high sensitivity troponin I (hsTnI) values and significant  changes across serial measurements may suggest ACS but many other  chronic and acute conditions are known to elevate hsTnI results.  Refer to the "Links" section for chest pain algorithms and additional  guidance. Performed at [facility], [address], [place], [place] [address]   Respiratory Panel by RT PCR (Flu A&B, Covid) - Nasopharyngeal Swab     Status: Abnormal   Collection  Time: 02/11/19  7:10 PM   Specimen: Nasopharyngeal Swab  Result Value Ref Range   SARS Coronavirus 2 by RT PCR POSITIVE (A) NEGATIVE    Comment: CRITICAL RESULT CALLED TO, READ BACK BY AND VERIFIED WITH: ANDREWS,L. AT 2018 ON 02/11/2019 BY EVA (NOTE) SARS-CoV-2 target nucleic acids are DETECTED. SARS-CoV-2 RNA is generally detectable in upper respiratory specimens  during the acute phase of infection. Positive results are indicative of the presence of the identified virus, but do not rule out bacterial infection or co-infection with other pathogens not detected by the test. Clinical correlation with patient history and other diagnostic information is necessary to determine patient infection  status. The expected result is Negative. Fact Sheet for Patients:  PinkCheek.be Fact Sheet for Healthcare Providers: GravelBags.it This test is not yet approved or cleared by the [place] FDA and  has been authorized for detection and/or diagnosis of SARS-CoV-2 by FDA under an Emergency Use Authorization (EUA).  This EUA will remain in effect (meaning this test c an be used) for the duration of  the COVID-19 declaration under Section 564(b)(1) of the Act, 21 U.S.C. section 360bbb-3(b)(1), unless the authorization is terminated or revoked sooner.    Influenza A by PCR NEGATIVE NEGATIVE   Influenza B by PCR NEGATIVE NEGATIVE    Comment: (NOTE) The Xpert Xpress SARS-CoV-2/FLU/RSV assay is intended as an aid in  the diagnosis of influenza from Nasopharyngeal swab specimens and  should not be used as a sole basis for treatment. Nasal washings and  aspirates are unacceptable for Xpert Xpress SARS-CoV-2/FLU/RSV  testing. Fact Sheet for Patients: PinkCheek.be Fact Sheet for Healthcare Providers: GravelBags.it This test is not yet approved or cleared by the [place] FDA and  has been authorized for detection and/or diagnosis of SARS-CoV-2 by  FDA under an Emergency Use Authorization (EUA). This EUA will remain  in effect (meaning this test can be used) for the duration of the  Covid-19 declaration under Section 564(b)(1) of the Act, 21  U.S.C. section 360bbb-3(b)(1), unless the authorization is  terminated or revoked. Performed at [facility], [address] [place], [place] [address]   TSH     Status: Abnormal   Collection Time: 02/11/19  8:45 PM  Result Value Ref Range   TSH 0.026 (L) 0.350 - 4.500 uIU/mL    Comment: Performed by a 3rd Generation assay with a functional sensitivity of <=0.01 uIU/mL. Performed at [facility], [address], [place], [place]  [address]   Troponin I (High Sensitivity)     Status: None   Collection Time: 02/11/19  8:45 PM  Result Value Ref Range   Troponin I (High Sensitivity) 10 <18 ng/L    Comment: (NOTE) Elevated high sensitivity troponin I (hsTnI) values and significant  changes across serial measurements may suggest ACS but many other  chronic and acute conditions are known to elevate hsTnI results.  Refer to the "Links" section for chest pain algorithms and additional  guidance. Performed at [facility], [address], [place], [place] [address]   Urinalysis, Routine w reflex microscopic     Status: None   Collection Time: 02/11/19 10:10 PM  Result Value Ref Range   Color, Urine YELLOW YELLOW   APPearance CLEAR CLEAR   Specific Gravity, Urine 1.017 1.005 - 1.030   pH 8.0 5.0 - 8.0   Glucose, UA NEGATIVE NEGATIVE mg/dL   Hgb urine dipstick NEGATIVE NEGATIVE   Bilirubin Urine NEGATIVE NEGATIVE   Ketones, ur NEGATIVE NEGATIVE mg/dL  Protein, ur NEGATIVE NEGATIVE mg/dL   Nitrite NEGATIVE NEGATIVE   Leukocytes,Ua NEGATIVE NEGATIVE    Comment: Performed at [facility], [address] [place], [place] [address]  Heparin level (unfractionated)     Status: None   Collection Time: 02/12/19  5:44 AM  Result Value Ref Range   Heparin Unfractionated 0.54 0.30 - 0.70 IU/mL    Comment: (NOTE) If heparin results are below expected values, and patient dosage has  been confirmed, suggest follow up testing of antithrombin III levels. Performed at [facility], [address], [place], [place] [address]   CBC     Status: Abnormal   Collection Time: 02/12/19  5:44 AM  Result Value Ref Range   WBC 10.6 (H) 4.0 - 10.5 K/uL   RBC 4.21 (L) 4.22 - 5.81 MIL/uL   Hemoglobin 12.3 (L) 13.0 - 17.0 g/dL   HCT 39.1 39.0 - 52.0 %   MCV 92.9 80.0 - 100.0 fL   MCH 29.2 26.0 - 34.0 pg   MCHC 31.5 30.0 - 36.0 g/dL   RDW 13.5 11.5 - 15.5 %   Platelets 178 150 - 400 K/uL   nRBC 0.0 0.0 - 0.2 %    Comment: Performed at [facility], [address], [place], [place] [address]  Comprehensive metabolic panel     Status: Abnormal   Collection Time: 02/12/19  5:44 AM  Result Value Ref Range   Sodium 143 135 - 145 mmol/L   Potassium 3.9 3.5 - 5.1 mmol/L    Comment: DELTA CHECK NOTED   Chloride 101 98 - 111 mmol/L   CO2 29 22 - 32 mmol/L   Glucose, Bld 138 (H) 70 - 99 mg/dL   BUN 36 (H) 8 - 23 mg/dL   Creatinine, Ser 1.46 (H) 0.61 - 1.24 mg/dL   Calcium 8.4 (L) 8.9 - 10.3 mg/dL   Total Protein 6.1 (L) 6.5 - 8.1 g/dL   Albumin 3.0 (L) 3.5 - 5.0 g/dL   AST 15 15 - 41 U/L   ALT 20 0 - 44 U/L   Alkaline Phosphatase 69 38 - 126 U/L   Total Bilirubin 0.8 0.3 - 1.2 mg/dL   GFR calc non Af Amer 44 (L) >60 mL/min   GFR calc Af Amer 51 (L) >60 mL/min   Anion gap 13 5 - 15    Comment: Performed at [facility], [address], [place], [place] [address]   DG Chest Portable 1 View  Result Date: 02/11/2019 CLINICAL DATA:  84 year old male with shortness of breath. EXAM: PORTABLE CHEST 1 VIEW COMPARISON:  Chest radiograph dated 01/26/2009. FINDINGS: There is cardiomegaly with mild vascular congestion. Minimal left mid to lower lung field subpleural hazy densities may represent atelectasis. Developing atypical infection is not excluded. Clinical correlation is recommended. No focal consolidation, pleural effusion, pneumothorax. No acute osseous pathology. IMPRESSION: 1. Cardiomegaly with mild vascular congestion. 2. Left mid to lower lung field faint atelectasis versus atypical infection. Clinical correlation is recommended. No focal consolidation. Electronically Signed   By: Anner Crete M.D.   On: 02/11/2019 19:20   DG Abd Portable 1V  Result Date: 02/11/2019 CLINICAL DATA:  84 year old male with abdominal pain. EXAM: PORTABLE ABDOMEN - 1 VIEW COMPARISON:  None. FINDINGS: Several mildly dilated loops of small bowel measuring up to 3.4 cm in caliber concerning for small-bowel obstruction. Clinical correlation is recommended.  Small-bowel series may provide better evaluation. Air is noted within the stomach. No free air identified. Right upper quadrant  cholecystectomy clips. Degenerative changes of the spine. No acute osseous pathology. IMPRESSION: Mildly dilated small bowel loops concerning for obstruction. Clinical correlation follow-up recommended. Electronically Signed   By: Anner Crete M.D.   On: 02/11/2019 22:58   . sodium chloride 75 mL/hr at 02/12/19 1126  . amiodarone 60 mg/hr (02/12/19 0909)   Followed by  . amiodarone    . diltiazem (CARDIZEM) infusion 10 mg/hr (02/12/19 1018)  . heparin 1,500 Units/hr (02/12/19 1125)      Assessment/Plan COVID POSITIVE 01/19/19 & 02/11/19 Aib/flutter with RVR - Cardizem/Amiodarone drips/Digoxin Hx MI with PTCA/Stents Hypertension Hyperlipidemia Hypothyroid BMI 37   Nausea and vomiting Possible SBO - Hx of VHR and cholecystectomy  FEN: NPO/IV fluids ID:  None DVT:  Heparin drip Follow up:  TBD  Plan:  He needs an NG, and then CT with oral contrast, no IV contrast.  We will order NG and CT later today.     Earnstine Regal [facility] Surgery 02/12/2019, 11:44 AM Please see Amion for pager number during day hours 7:00am-4:30pm

## 2019-02-12 NOTE — Progress Notes (Signed)
PROGRESS NOTE    Derek Foster  [id number] DOB: [birth year] DOA: 02/11/2019 PCP: Derek Squibb, MD    Brief Narrative:  HPI per Derek Foster:  Derek Foster  is a 84 y.o. male, with past medical history of CAD, status post stent in 2011, hypertension, hyperlipidemia, GERD, hypothyroidism, patient presents with complaints of abdominal pain, nausea, vomiting for last 24 hours, patient report overall generalized weakness, since his recent Covid diagnosis 12/30,(patient's wife passed away from COVID-73 2 weeks ago), patient denies any chest pain, fever, chills, hemoptysis, diarrhea, as well patient reports he has generalized weakness since his COVID-19 diagnosis, has been mainly using his wheelchair, as well he reports poor appetite, did not require admission or any treatment for his COVID-19. - in ED patient was noted to be in A. fib with RVR, which is new diagnosis, where he required Cardizem drip, initial heart rate 162, as well patient was started on heparin GTT, patient on baby aspirin daily for history of CAD, patient was afebrile, ED, but mild leukocytosis of 12.9, he denies any dysuria or cough, patient creatinine was noted to be elevated at 1.7, no recent baseline to compare, heart rate has improved in the 130s with Cardizem drip, I was called to admit.   Assessment & Plan:   Active Problems:   Mixed hyperlipidemia   Essential hypertension   CAD (coronary artery disease)   Atrial fibrillation with RVR (HCC)   AKI (acute kidney injury) ([place])   SBO (small bowel obstruction) ([place])   1. New onset atrial fibrillation with rapid ventricular response.  Patient presented with a heart rate in the 140s to 160s range.  Cardiac enzymes thus far have been negative.  TSH is in process.  Echocardiogram can be performed once heart rate is under better control.  He has been started on Cardizem which was maxed out at 15 mg/h.  Patient's heart rate remained tachycardic.  He did receive a total of 0.5 of IV  digoxin overnight and several doses of IV metoprolol.  At the time of my evaluation, patient's heart rate remained in the 140s to 150s range.  He appeared to be asymptomatic with this and did not complain of any chest pain, shortness of breath or palpitations.  Amiodarone infusion was ordered.  Of note, patient does have an allergy listed to iodine (anaphylaxis). Discussed with pharmacy and it was noted that chance of reaction with intravenous amiodarone was less than 1%.  Patient was started on amiodarone and over the last 30 minutes does not appear to have had any reaction.  Will need continued monitoring.  CHADSVASc score of at least at least 4.  He is currently on heparin infusion for anticoagulation.  Heart rate remains elevated and may need to consider DCCV.  Discussed with cardiology who will see the patient on arrival to [facility]. 2. Nausea and vomiting with concern for small bowel obstruction.  Patient has had prior surgery in the past including cholecystectomy and hernia repair.  He has had persistent vomiting which led him to the hospital.  Abdominal x-ray indicates small bowel obstruction.  We will keep n.p.o. and place NG tube for decompression.  General surgery consulted and will see the patient on arrival to [facility]. 3. Acute kidney injury.  Suspect this is related to volume loss from persistent vomiting.  Improving with IV hydration.  Continue IV fluids. 4. Hypertension.  Holding home medications since he is on Cardizem drip. 5. Hyperlipidemia.  Hold statin while NPO. 6.  GERD.  Continue on PPI 7. CAD s/p stents in the past. No complaints of chest pain. Troponins have been negative 8. Recent COVID-19 infection.  Patient reports that he was diagnosed on 12/30 with COVID-19 by his PCP.  Unfortunately these results are not available for review.  He was retested in the emergency room and tested positive.  If these results can be confirmed, then potentially airborne precautions can be  discontinued.   DVT prophylaxis: heparin infusion Code Status: DNR Family Communication: none present Disposition Plan: transfer to [facility] for further treatments. Patient requires inpatient status since he has persistent rapid atrial fibrillation needing IV therapies and further evaluation by cardiology. He also has possible SBO needing NG tube decompression and Gen Surg evaluation   Consultants:   Cardiology  General surgery  Procedures:     Antimicrobials:       Subjective: Patient denies any cough, shortness of breath.  He has had 2 episodes of vomiting since coming to the emergency room.  He denies any abdominal pain at this time.  Last bowel movement was last night.  He is not passing any gas.  He does not have any chest pain at this time.  Heart rate has remained elevated overnight.  Objective: Vitals:   02/12/19 0700 02/12/19 0730 02/12/19 0745 02/12/19 0815  BP: (!) 154/66 (!) 157/123 (!) 152/89 139/70  Pulse:  65 68 77  Resp: 18 20 18 12   Temp:      TempSrc:      SpO2:  97% 94% 99%  Weight:      Height:       No intake or output data in the 24 hours ending 02/12/19 0952 Filed Weights   02/11/19 1832  Weight: 128.4 kg    Examination:  General exam: Appears calm and comfortable  Respiratory system: Clear to auscultation. Respiratory effort normal. Cardiovascular system: S1 & S2 heard, irregular. No JVD, murmurs, rubs, gallops or clicks. No pedal edema. Gastrointestinal system: Abdomen is distended, soft and nontender. Ventral hernia is present and is soft, reducible and nontender. No organomegaly or masses felt. Normal bowel sounds heard. Central nervous system: Alert and oriented. No focal neurological deficits. Extremities: Symmetric 5 x 5 power. Skin: No rashes, lesions or ulcers Psychiatry: Judgement and insight appear normal. Mood & affect appropriate.     Data Reviewed: I have personally reviewed following Foster and imaging studies  CBC: Recent  Foster  Lab 02/11/19 1836 02/12/19 0544  WBC 12.9* 10.6*  NEUTROABS 10.1*  --   HGB 13.8 12.3*  HCT 42.5 39.1  MCV 92.6 92.9  PLT 192 0000000   Basic Metabolic Panel: Recent Foster  Lab 02/11/19 1836 02/12/19 0544  NA 145 143  K 4.7 3.9  CL 101 101  CO2 30 29  GLUCOSE 131* 138*  BUN 43* 36*  CREATININE 1.71* 1.46*  CALCIUM 8.8* 8.4*   GFR: Estimated Creatinine Clearance: 54.8 mL/min (A) (by C-G formula based on SCr of 1.46 mg/dL (H)). Liver Function Tests: Recent Foster  Lab 02/11/19 1836 02/12/19 0544  AST 19 15  ALT 22 20  ALKPHOS 78 69  BILITOT 0.7 0.8  PROT 6.8 6.1*  ALBUMIN 3.4* 3.0*   No results for input(s): LIPASE, AMYLASE in the last 168 hours. No results for input(s): AMMONIA in the last 168 hours. Coagulation Profile: No results for input(s): INR, PROTIME in the last 168 hours. Cardiac Enzymes: No results for input(s): CKTOTAL, CKMB, CKMBINDEX, TROPONINI in the last 168 hours. BNP (last  3 results) No results for input(s): PROBNP in the last 8760 hours. HbA1C: No results for input(s): HGBA1C in the last 72 hours. CBG: No results for input(s): GLUCAP in the last 168 hours. Lipid Profile: No results for input(s): CHOL, HDL, LDLCALC, TRIG, CHOLHDL, LDLDIRECT in the last 72 hours. Thyroid Function Tests: Recent Foster    02/11/19 2045  TSH 0.026*   Anemia Panel: No results for input(s): VITAMINB12, FOLATE, FERRITIN, TIBC, IRON, RETICCTPCT in the last 72 hours. Sepsis Foster: No results for input(s): PROCALCITON, LATICACIDVEN in the last 168 hours.  Recent Results (from the past 240 hour(s))  Respiratory Panel by RT PCR (Flu A&B, Covid) - Nasopharyngeal Swab     Status: Abnormal   Collection Time: 02/11/19  7:10 PM   Specimen: Nasopharyngeal Swab  Result Value Ref Range Status   SARS Coronavirus 2 by RT PCR POSITIVE (A) NEGATIVE Final    Comment: CRITICAL RESULT CALLED TO, READ BACK BY AND VERIFIED WITH: ANDREWS,L. AT 2018 ON 02/11/2019 BY  EVA (NOTE) SARS-CoV-2 target nucleic acids are DETECTED. SARS-CoV-2 RNA is generally detectable in upper respiratory specimens  during the acute phase of infection. Positive results are indicative of the presence of the identified virus, but do not rule out bacterial infection or co-infection with other pathogens not detected by the test. Clinical correlation with patient history and other diagnostic information is necessary to determine patient infection status. The expected result is Negative. Fact Sheet for Patients:  PinkCheek.be Fact Sheet for Healthcare Providers: GravelBags.it This test is not yet approved or cleared by the [place] FDA and  has been authorized for detection and/or diagnosis of SARS-CoV-2 by FDA under an Emergency Use Authorization (EUA).  This EUA will remain in effect (meaning this test c an be used) for the duration of  the COVID-19 declaration under Section 564(b)(1) of the Act, 21 U.S.C. section 360bbb-3(b)(1), unless the authorization is terminated or revoked sooner.    Influenza A by PCR NEGATIVE NEGATIVE Final   Influenza B by PCR NEGATIVE NEGATIVE Final    Comment: (NOTE) The Xpert Xpress SARS-CoV-2/FLU/RSV assay is intended as an aid in  the diagnosis of influenza from Nasopharyngeal swab specimens and  should not be used as a sole basis for treatment. Nasal washings and  aspirates are unacceptable for Xpert Xpress SARS-CoV-2/FLU/RSV  testing. Fact Sheet for Patients: PinkCheek.be Fact Sheet for Healthcare Providers: GravelBags.it This test is not yet approved or cleared by the [place] FDA and  has been authorized for detection and/or diagnosis of SARS-CoV-2 by  FDA under an Emergency Use Authorization (EUA). This EUA will remain  in effect (meaning this test can be used) for the duration of the  Covid-19 declaration under  Section 564(b)(1) of the Act, 21  U.S.C. section 360bbb-3(b)(1), unless the authorization is  terminated or revoked. Performed at [facility], [address] [place], [place] [address]          Radiology Studies: DG Chest Portable 1 View  Result Date: 02/11/2019 CLINICAL DATA:  84 year old male with shortness of breath. EXAM: PORTABLE CHEST 1 VIEW COMPARISON:  Chest radiograph dated 01/26/2009. FINDINGS: There is cardiomegaly with mild vascular congestion. Minimal left mid to lower lung field subpleural hazy densities may represent atelectasis. Developing atypical infection is not excluded. Clinical correlation is recommended. No focal consolidation, pleural effusion, pneumothorax. No acute osseous pathology. IMPRESSION: 1. Cardiomegaly with mild vascular congestion. 2. Left mid to lower lung field faint atelectasis versus atypical infection. Clinical correlation is recommended.  No focal consolidation. Electronically Signed   By: Anner Crete M.D.   On: 02/11/2019 19:20   DG Abd Portable 1V  Result Date: 02/11/2019 CLINICAL DATA:  84 year old male with abdominal pain. EXAM: PORTABLE ABDOMEN - 1 VIEW COMPARISON:  None. FINDINGS: Several mildly dilated loops of small bowel measuring up to 3.4 cm in caliber concerning for small-bowel obstruction. Clinical correlation is recommended. Small-bowel series may provide better evaluation. Air is noted within the stomach. No free air identified. Right upper quadrant cholecystectomy clips. Degenerative changes of the spine. No acute osseous pathology. IMPRESSION: Mildly dilated small bowel loops concerning for obstruction. Clinical correlation follow-up recommended. Electronically Signed   By: Anner Crete M.D.   On: 02/11/2019 22:58        Scheduled Meds: Continuous Infusions: . sodium chloride    . amiodarone 60 mg/hr (02/12/19 0909)   Followed by  . amiodarone    . diltiazem (CARDIZEM) infusion 15 mg/hr (02/12/19 0429)  . heparin  1,500 Units/hr (02/11/19 2222)     LOS: 1 day    Critical care procedure note Authorized and performed by: Kathie Dike Total critical care time: Approximately 40 minutes Due to high probability of clinically significant, life-threatening deterioration, the patient required my highest level of preparedness to intervene emergently and I personally spent this critical care time directly and personally managing the patient.  The critical care time included obtaining a history, examining the patient, pulse oximetry, ordering and review of studies, arranging urgent treatment with development of a management plan, evaluation of patient's response to treatment, frequent reassessment, discussions with other providers.  Critical care time was performed to assess and manage the high probability of imminent, life-threatening deterioration that could result in multiorgan failure.  It was exclusive of separate billable procedures and treating other patients and teaching time.  Please see MDM section and the rest of the of note for further information on patient assessment and treatment     Kathie Dike, MD [facility]   If 7PM-7AM, please contact night-coverage [email or web address]  02/12/2019, 9:52 AM

## 2019-02-12 NOTE — ED Notes (Signed)
Care link has left with pt. 

## 2019-02-12 NOTE — Progress Notes (Signed)
[place] for Heparin  Indication: atrial fibrillation  Allergies  Allergen Reactions  . Indomethacin Anaphylaxis    But tolerates ibuprofen, aleve  . Iodine-131 Anaphylaxis    Patient Measurements: Height: 6\' 1"  (185.4 cm) Weight: 283 lb (128.4 kg) IBW/kg (Calculated) : 79.9 Heparin Dosing Weight: 109 kg  Vital Signs: Temp: 97.8 F (36.6 C) (01/23 2053) Temp Source: Oral (01/23 2053) BP: 143/81 (01/23 2200) Pulse Rate: 120 (01/23 2200)  Labs: Recent Labs    02/11/19 1836 02/11/19 2045 02/12/19 0544 02/12/19 1238 02/12/19 2203  HGB 13.8  --  12.3*  --   --   HCT 42.5  --  39.1  --   --   PLT 192  --  178  --   --   HEPARINUNFRC  --   --  0.54 0.83* 0.61  CREATININE 1.71*  --  1.46*  --   --   TROPONINIHS 10 10  --   --   --     Estimated Creatinine Clearance: 54.8 mL/min (A) (by C-G formula based on SCr of 1.46 mg/dL (H)).   Medical History: Past Medical History:  Diagnosis Date  . Acute myocardial infarction, unspecified site, episode of care unspecified   . CAD in native artery    a. cath 01/27/2009 : s/p promus DES to LAD and medical managment of 70-80% mRCA  . Edema   . HTN (hypertension)   . Mixed hyperlipidemia   . Unspecified hypothyroidism    Assessment: 84 y.o. male presents with abd pain. In ED, noted to have afib with RVR which is a new diagnosis. Pharmacy consulted to dose heparin for Afib. Patient has concern of SBO.   Heparin level 0.61 is therapeutic on heparin 1350 units/hr.  Goal of Therapy:  Heparin level 0.3-0.7 units/ml Monitor platelets by anticoagulation protocol: Yes   Plan:  Continue heparin at 1350 units/hr  Check next heparin level with AM labs Daily heparin level and CBC Monitor for bleeding Follow up resolution of SBO and transition to oral [facility]   Vertis Kelch, PharmD, [facility] PGY2 Cardiology Pharmacy Resident Phone [phone] 02/12/2019       10:45 PM  Please check [email or web address] for  unit-specific pharmacist phone numbers

## 2019-02-12 NOTE — Consult Note (Signed)
Cardiology Consultation:   Due to the COVID-19 pandemic, this visit was completed with telemedicine (audio/video) technology to reduce patient and provider exposure as well as to preserve personal protective equipment.   Patient ID: Derek Foster MRN: [id number]; DOB: 05/26/1935  Admit date: 02/11/2019 Date of Consult: 02/12/2019  Primary Care Provider: Celene Squibb, MD Primary Cardiologist: New (Last seen by Dr. Johnsie Foster in 2014)   Patient Profile:   Derek Foster is a 84 y.o. male with a hx of CAD s/p DES to LAD in 2011, HTN, HLD and hypothyroidism who is being seen today for the evaluation of atrial fibrillation at the request of Derek Foster.   Hx of anterior MI in 01/2009. He had Promus DES placed to LAD. Medically treated 70-80% mRCA.  Myoview on 02/2009 showed no ischemia or infarction with EF 66%. He was Last seen by Dr. Johnsie Foster 05/2012.   History of Present Illness:   Derek Foster was doing well until diagnosed with COVID 19 on 01/19/19. Did not required admission or any treatment. Wife passed away with COVID 2 weeks. He has generalized weakness since dx with COVID without fever, chills or chest pai. However presented brought by EMS to [facility] ER with 24 hours hx of abdominal pain, nausea and vomiting. He was found to be in new onset afib RVR>> started on IV cardizem and heparin. Given one dose of digoxin. Due to elevated HR, he started on IV amiodarone this morning and cardiology is asked for further evaluation. TSH and echo pending. Abdominal Xray showed SBO. He is NPO and pending general surgery evaluation.   COVID positive this admission. Per attending note  "Patient reports that he was diagnosed on 12/30 with COVID-19 by his PCP.  Unfortunately these results are not available for review.  He was retested in the emergency room and tested positive.  If these results can be confirmed, then potentially airborne precautions can be discontinued".  Patient denies any palpitations, chest pain,  shortness of breath, orthopnea, PND or syncope. No regular exercise but does house hold chores without any issue. Did not required any cardiac evaluation since his MI in 2011.  Past Medical History:  Diagnosis Date  . Acute myocardial infarction, unspecified site, episode of care unspecified   . CAD in native artery    a. cath 01/27/2009 : s/p promus DES to LAD and medical managment of 70-80% mRCA  . Edema   . HTN (hypertension)   . Mixed hyperlipidemia   . Unspecified hypothyroidism     Past Surgical History:  Procedure Laterality Date  . CHOLECYSTECTOMY    . CORONARY STENT PLACEMENT    . HERNIA REPAIR      Inpatient Medications: Scheduled Meds:  Continuous Infusions: . sodium chloride    . amiodarone 60 mg/hr (02/12/19 0909)   Followed by  . amiodarone    . diltiazem (CARDIZEM) infusion 10 mg/hr (02/12/19 1018)  . heparin 1,500 Units/hr (02/12/19 1000)   PRN Meds: acetaminophen **OR** acetaminophen, ondansetron (ZOFRAN) IV  Allergies:    Allergies  Allergen Reactions  . Indomethacin Anaphylaxis    But tolerates ibuprofen, aleve  . Iodine-131 Anaphylaxis    Social History:   Social History   Socioeconomic History  . Marital status: Married    Spouse name: Not on file  . Number of children: Not on file  . Years of education: Not on file  . Highest education level: Not on file  Occupational History  . Occupation: Charity fundraiser  Tobacco Use  .  Smoking status: Former Smoker    Quit date: 01/20/1957    Years since quitting: 62.1  . Smokeless tobacco: Never Used  Substance and Sexual Activity  . Alcohol use: No  . Drug use: Never  . Sexual activity: Not on file  Other Topics Concern  . Not on file  Social History Narrative  . Not on file   Social Determinants of Health   Financial Resource Strain:   . Difficulty of Paying Living Expenses: Not on file  Food Insecurity:   . Worried About Charity fundraiser in the Last Year: Not on file  . Ran Out of Food in  the Last Year: Not on file  Transportation Needs:   . Lack of Transportation (Medical): Not on file  . Lack of Transportation (Non-Medical): Not on file  Physical Activity:   . Days of Exercise per Week: Not on file  . Minutes of Exercise per Session: Not on file  Stress:   . Feeling of Stress : Not on file  Social Connections:   . Frequency of Communication with Friends and Family: Not on file  . Frequency of Social Gatherings with Friends and Family: Not on file  . Attends Religious Services: Not on file  . Active Member of Clubs or Organizations: Not on file  . Attends Archivist Meetings: Not on file  . Marital Status: Not on file  Intimate Partner Violence:   . Fear of Current or Ex-Partner: Not on file  . Emotionally Abused: Not on file  . Physically Abused: Not on file  . Sexually Abused: Not on file    Family History:   Family History  Problem Relation Age of Onset  . Heart attack Father   . Lung cancer Sister      ROS:  Please see the history of present illness.  All other ROS reviewed and negative.     Physical Exam/Data:   Vitals:   02/12/19 0815 02/12/19 1000 02/12/19 1030 02/12/19 1045  BP: 139/70 (!) 121/50 97/68 111/64  Pulse: 77 (!) 101 (!) 108 (!) 134  Resp: 12 17 18 17   Temp:  98.1 F (36.7 C)    TempSrc:  Oral    SpO2: 99% 94% 93% 92%  Weight:      Height:        Intake/Output Summary (Last 24 hours) at 02/12/2019 1119 Last data filed at 02/12/2019 1000 Gross per 24 hour  Intake 482.49 ml  Output -  Net 482.49 ml   Last 3 Weights 02/11/2019 06/16/2012 02/25/2011  Weight (lbs) 283 lb 282 lb 272 lb  Weight (kg) 128.368 kg 127.914 kg 123.378 kg     Body mass index is 37.34 kg/m.   VITAL SIGNS:  reviewed GEN:  no acute distress PSYCH:  normal affect  EKG:  The EKG was personally reviewed and demonstrates:  Atrial fibrillation at rate of 146 bpm Telemetry:  Telemetry was personally reviewed and demonstrates: atrial fibrillation  100-130s  Relevant CV Studies:  Reviewed cath report from 01/27/2009: S/p PCI for LAD with promus and 70-80% mRCA  Laboratory Data:  Chemistry Recent Labs  Lab 02/11/19 1836 02/12/19 0544  NA 145 143  K 4.7 3.9  CL 101 101  CO2 30 29  GLUCOSE 131* 138*  BUN 43* 36*  CREATININE 1.71* 1.46*  CALCIUM 8.8* 8.4*  GFRNONAA 36* 44*  GFRAA 42* 51*  ANIONGAP 14 13    Recent Labs  Lab 02/11/19 1836 02/12/19 0544  PROT  6.8 6.1*  ALBUMIN 3.4* 3.0*  AST 19 15  ALT 22 20  ALKPHOS 78 69  BILITOT 0.7 0.8   Hematology Recent Labs  Lab 02/11/19 1836 02/12/19 0544  WBC 12.9* 10.6*  RBC 4.59 4.21*  HGB 13.8 12.3*  HCT 42.5 39.1  MCV 92.6 92.9  MCH 30.1 29.2  MCHC 32.5 31.5  RDW 13.5 13.5  PLT 192 178   Radiology/Studies:  DG Chest Portable 1 View  Result Date: 02/11/2019 CLINICAL DATA:  84 year old male with shortness of breath. EXAM: PORTABLE CHEST 1 VIEW COMPARISON:  Chest radiograph dated 01/26/2009. FINDINGS: There is cardiomegaly with mild vascular congestion. Minimal left mid to lower lung field subpleural hazy densities may represent atelectasis. Developing atypical infection is not excluded. Clinical correlation is recommended. No focal consolidation, pleural effusion, pneumothorax. No acute osseous pathology. IMPRESSION: 1. Cardiomegaly with mild vascular congestion. 2. Left mid to lower lung field faint atelectasis versus atypical infection. Clinical correlation is recommended. No focal consolidation. Electronically Signed   By: Anner Crete M.D.   On: 02/11/2019 19:20   DG Abd Portable 1V  Result Date: 02/11/2019 CLINICAL DATA:  84 year old male with abdominal pain. EXAM: PORTABLE ABDOMEN - 1 VIEW COMPARISON:  None. FINDINGS: Several mildly dilated loops of small bowel measuring up to 3.4 cm in caliber concerning for small-bowel obstruction. Clinical correlation is recommended. Small-bowel series may provide better evaluation. Air is noted within the stomach. No  free air identified. Right upper quadrant cholecystectomy clips. Degenerative changes of the spine. No acute osseous pathology. IMPRESSION: Mildly dilated small bowel loops concerning for obstruction. Clinical correlation follow-up recommended. Electronically Signed   By: Anner Crete M.D.   On: 02/11/2019 22:58    Assessment and Plan:   1. New onset atrial fibrillation with RVR of unknown duration In setting of COVID 19 infection (dx 01/19/19) and SBO.  - Rate elevated on IV cardizem and PRN IV metoprolol. Started on IV amiodarone. CHADSVASC score of 4 for age, HTN and vascular dz. Patient is asymptomatic with afib. Continue IV heparin. Pending echo. TSH is low.   2. COVID 19 infection - Per attending team  3. SBO - pending general surgery evaluation  4. Low TSH - get free T4  5. AKI - No prior hx of CAD. Scr improving with hydration 1.71>>>1.46 - Home Ibesartan on hold   6. CAD  - No angina. BB, statin and ASA on hold due to NO.   7. HTN - home antihypertensive on hold - BP stable currently    For questions or updates, please contact [place] Please consult [email or web address] for contact info under     Jarrett Soho, PA  02/12/2019 11:19 AM

## 2019-02-12 NOTE — ED Notes (Addendum)
Pt vomited approx 500 ml of bile substance. Pt was given OJ and reports he took a couple sips and it came right back up. Denies abdominal pain.abd distended Hosp notified and coming to see pt

## 2019-02-12 NOTE — Progress Notes (Signed)
[place] for Heparin  Indication: atrial fibrillation  Allergies  Allergen Reactions  . Indomethacin Anaphylaxis    But tolerates ibuprofen, aleve  . Iodine-131 Anaphylaxis    Patient Measurements: Height: 6\' 1"  (185.4 cm) Weight: 283 lb (128.4 kg) IBW/kg (Calculated) : 79.9 Heparin Dosing Weight: 109 kg  Vital Signs: BP: 140/71 (01/23 0530) Pulse Rate: 74 (01/23 0530)  Labs: Recent Labs    02/11/19 1836 02/11/19 2045 02/12/19 0544  HGB 13.8  --  12.3*  HCT 42.5  --  39.1  PLT 192  --  178  HEPARINUNFRC  --   --  0.54  CREATININE 1.71*  --   --   TROPONINIHS 10 10  --     Estimated Creatinine Clearance: 46.8 mL/min (A) (by C-G formula based on SCr of 1.71 mg/dL (H)).   Medical History: Past Medical History:  Diagnosis Date  . Acute myocardial infarction, unspecified site, episode of care unspecified   . Edema   . HTN (hypertension)   . Mixed hyperlipidemia   . Unspecified hypothyroidism     Medications:  See electronic med rec  Assessment: 84 y.o. M presents with abd pain. In ED, noted to have afib with RVR (new diagnosis). ED provider started heparin with 4000 unit bolus and 1000 units/hr gtt (started about 1 hour ago). Pharmacy consulted to dose heparin.  Heparin level therapeutic (0.54) on gtt at 1500 units/hr. No bleeding noted.  Goal of Therapy:  Heparin level 0.3-0.7 units/ml Monitor platelets by anticoagulation protocol: Yes   Plan:  Continue heparin gtt at 1500 units/hr Will f/u 6hr confirmatory heparin level Daily heparin level and CBC  Sherlon Handing, PharmD, BCPS Please see amion for complete clinical pharmacist phone list 02/12/2019,6:37 AM

## 2019-02-12 NOTE — ED Notes (Signed)
Daughter, Malachy Mood updated of pt condition.

## 2019-02-12 NOTE — Progress Notes (Signed)
[place] for Heparin  Indication: atrial fibrillation  Allergies  Allergen Reactions  . Indomethacin Anaphylaxis    But tolerates ibuprofen, aleve  . Iodine-131 Anaphylaxis    Patient Measurements: Height: 6\' 1"  (185.4 cm) Weight: 283 lb (128.4 kg) IBW/kg (Calculated) : 79.9 Heparin Dosing Weight: 109 kg  Vital Signs: Temp: 98.1 F (36.7 C) (01/23 1000) Temp Source: Oral (01/23 1000) BP: 114/50 (01/23 1120) Pulse Rate: 120 (01/23 1120)  Labs: Recent Labs    02/11/19 1836 02/11/19 2045 02/12/19 0544  HGB 13.8  --  12.3*  HCT 42.5  --  39.1  PLT 192  --  178  HEPARINUNFRC  --   --  0.54  CREATININE 1.71*  --  1.46*  TROPONINIHS 10 10  --     Estimated Creatinine Clearance: 54.8 mL/min (A) (by C-G formula based on SCr of 1.46 mg/dL (H)).   Medical History: Past Medical History:  Diagnosis Date  . Acute myocardial infarction, unspecified site, episode of care unspecified   . CAD in native artery    a. cath 01/27/2009 : s/p promus DES to LAD and medical managment of 70-80% mRCA  . Edema   . HTN (hypertension)   . Mixed hyperlipidemia   . Unspecified hypothyroidism    Assessment: 84 y.o. male presents with abd pain. In ED, noted to have afib with RVR which is a new diagnosis. Pharmacy consulted to dose heparin for Afib. Patient has concern of SBO.   Heparin level 0.83 is supratherapeutic on heparin 1500 units/hr. Heparin level drawn correctly per RN. Unable to get in touch with phlebotomy. CBC stable. No reported bleeding.   Goal of Therapy:  Heparin level 0.3-0.7 units/ml Monitor platelets by anticoagulation protocol: Yes   Plan:  Decrease heparin to 1350 units/hr  Check heparin level at 2200 tonight  Monitor heparin level, CBC and S/S of bleeding daily  Follow up resolution of SBO and transition to oral [facility]   Cristela Felt, PharmD PGY1 Pharmacy Resident [employer]: [phone]  02/12/2019,12:50 PM

## 2019-02-12 NOTE — Progress Notes (Signed)
Second dose of contrast administered at this time for CT scan.

## 2019-02-13 ENCOUNTER — Inpatient Hospital Stay (HOSPITAL_COMMUNITY): Payer: PPO

## 2019-02-13 DIAGNOSIS — N179 Acute kidney failure, unspecified: Secondary | ICD-10-CM

## 2019-02-13 DIAGNOSIS — I361 Nonrheumatic tricuspid (valve) insufficiency: Secondary | ICD-10-CM

## 2019-02-13 LAB — CBC
HCT: 37.4 % — ABNORMAL LOW (ref 39.0–52.0)
HCT: 33.9 % — ABNORMAL LOW (ref 39.0–52.0)
Hemoglobin: 12.2 g/dL — ABNORMAL LOW (ref 13.0–17.0)
Hemoglobin: 10.7 g/dL — ABNORMAL LOW (ref 13.0–17.0)
MCH: 29.7 pg (ref 26.0–34.0)
MCH: 29.4 pg (ref 26.0–34.0)
MCHC: 32.6 g/dL (ref 30.0–36.0)
MCHC: 31.6 g/dL (ref 30.0–36.0)
MCV: 91 fL (ref 80.0–100.0)
MCV: 93.1 fL (ref 80.0–100.0)
Platelets: 139 10*3/uL — ABNORMAL LOW (ref 150–400)
Platelets: 131 10*3/uL — ABNORMAL LOW (ref 150–400)
RBC: 4.11 MIL/uL — ABNORMAL LOW (ref 4.22–5.81)
RBC: 3.64 MIL/uL — ABNORMAL LOW (ref 4.22–5.81)
RDW: 13.4 % (ref 11.5–15.5)
RDW: 13.3 % (ref 11.5–15.5)
WBC: 7.8 10*3/uL (ref 4.0–10.5)
WBC: 7.4 10*3/uL (ref 4.0–10.5)
nRBC: 0 % (ref 0.0–0.2)
nRBC: 0 % (ref 0.0–0.2)

## 2019-02-13 LAB — BASIC METABOLIC PANEL
Anion gap: 10 (ref 5–15)
BUN: 27 mg/dL — ABNORMAL HIGH (ref 8–23)
CO2: 30 mmol/L (ref 22–32)
Calcium: 8.2 mg/dL — ABNORMAL LOW (ref 8.9–10.3)
Chloride: 102 mmol/L (ref 98–111)
Creatinine, Ser: 1.54 mg/dL — ABNORMAL HIGH (ref 0.61–1.24)
GFR calc Af Amer: 48 mL/min — ABNORMAL LOW (ref >60)
GFR calc non Af Amer: 41 mL/min — ABNORMAL LOW (ref >60)
Glucose, Bld: 124 mg/dL — ABNORMAL HIGH (ref 70–99)
Potassium: 3.5 mmol/L (ref 3.5–5.1)
Sodium: 142 mmol/L (ref 135–145)

## 2019-02-13 LAB — HEPARIN LEVEL (UNFRACTIONATED): Heparin Unfractionated: 0.58 IU/mL (ref 0.30–0.70)

## 2019-02-13 LAB — MAGNESIUM: Magnesium: 1.9 mg/dL (ref 1.7–2.4)

## 2019-02-13 LAB — ECHOCARDIOGRAM COMPLETE
Height: 73 in
Weight: 4528 oz

## 2019-02-13 LAB — T3, FREE: T3, Free: 3.4 pg/mL (ref 2.0–4.4)

## 2019-02-13 MED ORDER — SODIUM CHLORIDE 0.9 % IV SOLN: INTRAVENOUS | Status: DC

## 2019-02-13 MED ORDER — METOPROLOL TARTRATE 5 MG/5ML IV SOLN
5.0000 mg | Freq: Four times a day (QID) | INTRAVENOUS | Status: DC
  Administered 2019-02-13: 11:00:00 5 mg via INTRAVENOUS
  Filled 2019-02-13: qty 5

## 2019-02-13 MED ORDER — PANTOPRAZOLE SODIUM 40 MG IV SOLR
40.0000 mg | Freq: Two times a day (BID) | INTRAVENOUS | Status: DC
  Administered 2019-02-13 – 2019-02-20 (×16): 40 mg via INTRAVENOUS
  Filled 2019-02-13 (×16): qty 40

## 2019-02-13 MED ORDER — PERFLUTREN LIPID MICROSPHERE
1.0000 mL | INTRAVENOUS | Status: AC | PRN
  Administered 2019-02-13: 2 mL via INTRAVENOUS
  Filled 2019-02-13: qty 10

## 2019-02-13 MED ORDER — METOPROLOL TARTRATE 5 MG/5ML IV SOLN
10.0000 mg | Freq: Four times a day (QID) | INTRAVENOUS | Status: DC
  Administered 2019-02-13 – 2019-02-15 (×7): 10 mg via INTRAVENOUS
  Filled 2019-02-13 (×7): qty 10

## 2019-02-13 NOTE — Progress Notes (Signed)
  Echocardiogram 2D Echocardiogram has been performed.  Derek Foster F 02/13/2019, 2:47 PM

## 2019-02-13 NOTE — Progress Notes (Signed)
[place] for Heparin  Indication: atrial fibrillation  Allergies  Allergen Reactions  . Indomethacin Anaphylaxis    But tolerates ibuprofen, aleve  . Iodine-131 Anaphylaxis    Patient Measurements: Height: 6\' 1"  (185.4 cm) Weight: 283 lb (128.4 kg) IBW/kg (Calculated) : 79.9 Heparin Dosing Weight: 109 kg  Vital Signs: Temp: 98.2 F (36.8 C) (01/24 0400) Temp Source: Oral (01/24 0400) BP: 116/72 (01/24 0400) Pulse Rate: 131 (01/24 0400)  Labs: Recent Labs    02/11/19 1836 02/11/19 1836 02/11/19 2045 02/12/19 0544 02/12/19 0544 02/12/19 1238 02/12/19 2203 02/13/19 0322  HGB 13.8   < >  --  12.3*  --   --   --  12.2*  HCT 42.5  --   --  39.1  --   --   --  37.4*  PLT 192  --   --  178  --   --   --  139*  HEPARINUNFRC  --   --   --  0.54   < > 0.83* 0.61 0.58  CREATININE 1.71*  --   --  1.46*  --   --   --  1.54*  TROPONINIHS 10  --  10  --   --   --   --   --    < > = values in this interval not displayed.    Estimated Creatinine Clearance: 51.9 mL/min (A) (by C-G formula based on SCr of 1.54 mg/dL (H)).   Medical History: Past Medical History:  Diagnosis Date  . Acute myocardial infarction, unspecified site, episode of care unspecified   . CAD in native artery    a. cath 01/27/2009 : s/p promus DES to LAD and medical managment of 70-80% mRCA  . Edema   . HTN (hypertension)   . Mixed hyperlipidemia   . Unspecified hypothyroidism    Assessment: 84 y.o. male presents with abd pain. In ED, noted to have afib with RVR which is a new diagnosis. Pharmacy consulted to dose heparin for Afib. Patient has concern of SBO. X-ray on 1/22 was concerning for obstruction and CT on 1/23 found distal SBO due to presence of ventral hernia. Surgery is consulted.   Heparin level 0.58 is therapeutic on heparin 1350 units/hr. Hgb 12.2. Plt 139. RN reported brown/maroon output from NG but no fresh blood on 1/22 evening. This morning RN reported  output from NG as brown with slightly red tinge but no frank red blood. RN assured that if this changes she will contact the MD and pharmacy to address.   Goal of Therapy:  Heparin level 0.3-0.7 units/ml Monitor platelets by anticoagulation protocol: Yes   Plan:  Continue heparin at 1350 units/hr  Transition to checking heparin level daily since two therapeutic heparin level at heparin 1350 units/hr  Monitor NG output in terms of bleeding  Monitor heparin level, CBC and S/S of bleeding daily  Follow up surgery plans, resolution of SBO, and transition to oral [facility]   Cristela Felt, PharmD PGY1 Pharmacy Resident [employer]: [phone]  02/13/2019,7:16 AM

## 2019-02-13 NOTE — Progress Notes (Signed)
PROGRESS NOTE    Derek Foster  [id number] DOB: [birth year] DOA: 02/11/2019 PCP: Celene Squibb, MD    Brief Narrative:  HPI per Dr. Waldron Labs:  Derek Foster  is a 84 y.o. male, with past medical history of CAD, status post stent in 2011, hypertension, hyperlipidemia, GERD, hypothyroidism, patient presents with complaints of abdominal pain, nausea, vomiting for last 24 hours, patient report overall generalized weakness, since his recent Covid diagnosis 12/30,(patient's wife passed away from COVID-5 2 weeks ago), patient denies any chest pain, fever, chills, hemoptysis, diarrhea, as well patient reports he has generalized weakness since his COVID-19 diagnosis, has been mainly using his wheelchair, as well he reports poor appetite, did not require admission or any treatment for his COVID-19. - in ED patient was noted to be in A. fib with RVR, which is new diagnosis, where he required Cardizem drip, initial heart rate 162, as well patient was started on heparin GTT, patient on baby aspirin daily for history of CAD, patient was afebrile, ED, but mild leukocytosis of 12.9, he denies any dysuria or cough, patient creatinine was noted to be elevated at 1.7, no recent baseline to compare, heart rate has improved in the 130s with Cardizem drip, I was called to admit.   Assessment & Plan:   New onset atrial flutter with rapid ventricular response -Continues to have rapid atrial flutter, -Remains on IV Cardizem gtt., amiodarone gtt -Scheduled IV metoprolol and digoxin -Continue IV heparin, now with some coffee-ground NG output, monitor -Thyroid labs consistent with hyperthyroidism, will start Tapazole once able to tolerate p.o. -Check 2D echocardiogram when heart rate is under better control -Cardiology following  Small bowel obstruction -Likely secondary to adhesions from cholecystectomy, prior hernia repair -Continues to have distended loops of small bowel, however some contrast in the colon noted  suggesting partial obstruction -General surgery following, continue NG decompression, n.p.o.  Acute kidney injury -Likely secondary to volume loss, continue IV fluids today, has small pleural effusion however without dyspnea, monitor at this time  Recent COVID-19 infection -Patient reports being diagnosed on 12/30 at his PCP Dr. Josue Hector office, will confirm this on Monday, he has completed 3-week isolation period on 1/21 -Was asymptomatic from this  History of CAD -Remote history of stents -Stable  Hypertension -Currently on Cardizem drip and IV metoprolol, -BP is stable  GERD -IV PPI  DVT prophylaxis: heparin infusion Code Status: DNR Family Communication: none present Disposition Plan: Pending resolution of SBO, A. fib RVR, may need SNF   Consultants:   Cardiology  General surgery  Procedures:     Antimicrobials:       Subjective: -NG tube having brown output, passing flatus, no bowel movement yet Denies any shortness of breath, extremely uncomfortable with having the tube  Objective: Vitals:   02/13/19 0800 02/13/19 0805 02/13/19 0900 02/13/19 1000  BP: 116/75 (!) 116/55 (!) 128/54 (!) 149/121  Pulse: (!) 127  (!) 120 98  Resp: 19  14 16   Temp: 98.4 F (36.9 C)     TempSrc: Oral     SpO2: 93%  94% 93%  Weight:      Height:        Intake/Output Summary (Last 24 hours) at 02/13/2019 1055 Last data filed at 02/13/2019 0600 Gross per 24 hour  Intake 3308.52 ml  Output 1900 ml  Net 1408.52 ml   Filed Weights   02/11/19 1832  Weight: 128.4 kg    Examination:  General exam: Pleasant elderly male, lying in  bed, uncomfortable appearing, AAO x3 Respiratory system: Diminished breath sounds the bases, otherwise clear Cardiovascular system: S1-S2, irregularly irregular rhythm, Gastrointestinal system: Soft, nontender, nondistended, bowel sounds diminished but present Central nervous system: Alert and oriented. No focal neurological  deficits. Extremities: No edema  skin: No rashes Psychiatry: Judgement and insight appear normal. Mood & affect appropriate.     Data Reviewed: I have personally reviewed following labs and imaging studies  CBC: Recent Labs  Lab 02/11/19 1836 02/12/19 0544 02/13/19 0322  WBC 12.9* 10.6* 7.8  NEUTROABS 10.1*  --   --   HGB 13.8 12.3* 12.2*  HCT 42.5 39.1 37.4*  MCV 92.6 92.9 91.0  PLT 192 178 [id number]*   Basic Metabolic Panel: Recent Labs  Lab 02/11/19 1836 02/12/19 0544 02/13/19 0322  NA 145 143 142  K 4.7 3.9 3.5  CL 101 101 102  CO2 30 29 30   GLUCOSE 131* 138* 124*  BUN 43* 36* 27*  CREATININE 1.71* 1.46* 1.54*  CALCIUM 8.8* 8.4* 8.2*   GFR: Estimated Creatinine Clearance: 51.9 mL/min (A) (by C-G formula based on SCr of 1.54 mg/dL (H)). Liver Function Tests: Recent Labs  Lab 02/11/19 1836 02/12/19 0544  AST 19 15  ALT 22 20  ALKPHOS 78 69  BILITOT 0.7 0.8  PROT 6.8 6.1*  ALBUMIN 3.4* 3.0*   No results for input(s): LIPASE, AMYLASE in the last 168 hours. No results for input(s): AMMONIA in the last 168 hours. Coagulation Profile: No results for input(s): INR, PROTIME in the last 168 hours. Cardiac Enzymes: No results for input(s): CKTOTAL, CKMB, CKMBINDEX, TROPONINI in the last 168 hours. BNP (last 3 results) No results for input(s): PROBNP in the last 8760 hours. HbA1C: No results for input(s): HGBA1C in the last 72 hours. CBG: No results for input(s): GLUCAP in the last 168 hours. Lipid Profile: No results for input(s): CHOL, HDL, LDLCALC, TRIG, CHOLHDL, LDLDIRECT in the last 72 hours. Thyroid Function Tests: Recent Labs    02/11/19 2045 02/12/19 0544  TSH 0.026*  --   FREET4  --  1.76*  T3FREE  --  3.4   Anemia Panel: No results for input(s): VITAMINB12, FOLATE, FERRITIN, TIBC, IRON, RETICCTPCT in the last 72 hours. Sepsis Labs: No results for input(s): PROCALCITON, LATICACIDVEN in the last 168 hours.  Recent Results (from the past 240  hour(s))  Respiratory Panel by RT PCR (Flu A&B, Covid) - Nasopharyngeal Swab     Status: Abnormal   Collection Time: 02/11/19  7:10 PM   Specimen: Nasopharyngeal Swab  Result Value Ref Range Status   SARS Coronavirus 2 by RT PCR POSITIVE (A) NEGATIVE Final    Comment: CRITICAL RESULT CALLED TO, READ BACK BY AND VERIFIED WITH: ANDREWS,L. AT 2018 ON 02/11/2019 BY EVA (NOTE) SARS-CoV-2 target nucleic acids are DETECTED. SARS-CoV-2 RNA is generally detectable in upper respiratory specimens  during the acute phase of infection. Positive results are indicative of the presence of the identified virus, but do not rule out bacterial infection or co-infection with other pathogens not detected by the test. Clinical correlation with patient history and other diagnostic information is necessary to determine patient infection status. The expected result is Negative. Fact Sheet for Patients:  PinkCheek.be Fact Sheet for Healthcare Providers: GravelBags.it This test is not yet approved or cleared by the [place] FDA and  has been authorized for detection and/or diagnosis of SARS-CoV-2 by FDA under an Emergency Use Authorization (EUA).  This EUA will remain in effect (meaning this test c  an be used) for the duration of  the COVID-19 declaration under Section 564(b)(1) of the Act, 21 U.S.C. section 360bbb-3(b)(1), unless the authorization is terminated or revoked sooner.    Influenza A by PCR NEGATIVE NEGATIVE Final   Influenza B by PCR NEGATIVE NEGATIVE Final    Comment: (NOTE) The Xpert Xpress SARS-CoV-2/FLU/RSV assay is intended as an aid in  the diagnosis of influenza from Nasopharyngeal swab specimens and  should not be used as a sole basis for treatment. Nasal washings and  aspirates are unacceptable for Xpert Xpress SARS-CoV-2/FLU/RSV  testing. Fact Sheet for Patients: PinkCheek.be Fact Sheet for  Healthcare Providers: GravelBags.it This test is not yet approved or cleared by the [place] FDA and  has been authorized for detection and/or diagnosis of SARS-CoV-2 by  FDA under an Emergency Use Authorization (EUA). This EUA will remain  in effect (meaning this test can be used) for the duration of the  Covid-19 declaration under Section 564(b)(1) of the Act, 21  U.S.C. section 360bbb-3(b)(1), unless the authorization is  terminated or revoked. Performed at [facility], [address], [place], [place] [address]          Radiology Studies: CT ABDOMEN PELVIS WO CONTRAST  Addendum Date: 02/12/2019   ADDENDUM REPORT: 02/12/2019 19:31 ADDENDUM: Results were discussed with Dr. Bobbye Morton at 7:28 p.m. [place] on February 12, 2019. Electronically Signed   By: Virgina Norfolk M.D.   On: 02/12/2019 19:31   Result Date: 02/12/2019 CLINICAL DATA:  Abdominal distension. EXAM: CT ABDOMEN AND PELVIS WITHOUT CONTRAST TECHNIQUE: Multidetector CT imaging of the abdomen and pelvis was performed following the standard protocol without IV contrast. COMPARISON:  None. FINDINGS: Lower chest: Moderate severity atelectasis and/or infiltrate is seen along the posterolateral aspect of the left lung base. A small adjacent left pleural effusion is seen. Hepatobiliary: No focal liver abnormality is seen. Status post cholecystectomy. No biliary dilatation. Pancreas: Unremarkable. No pancreatic ductal dilatation or surrounding inflammatory changes. Spleen: Normal in size without focal abnormality. Adrenals/Urinary Tract: Adrenal glands are unremarkable. Kidneys are normal, without renal calculi or hydronephrosis. Multiple small bilateral parapelvic renal cysts are seen. Bladder is unremarkable. Stomach/Bowel: A nasogastric tube is seen with its distal tip noted within the body of the stomach. The appendix is not identified. Multiple dilated opacified small bowel loops are seen within the  lower abdomen and pelvis (maximum small bowel diameter of approximately 3.8 cm). These dilated bowel loops extend into a fat containing ventral hernia (see below). Decompressed small bowel loops are seen exiting this ventral hernia. Vascular/Lymphatic: Marked severity aortic atherosclerosis. No enlarged abdominal or pelvic lymph nodes. Reproductive: The prostate gland is mildly enlarged. Other: A 7.8 cm x 2.8 cm fat containing ventral hernia is seen along the anterior aspect of the mid abdominal wall, to the left of midline. An additional 9.9 cm x 4.6 cm ventral hernia is seen within the region above the umbilicus, to the left of midline. This area of herniation contains fat and a small segment of small bowel. Musculoskeletal: Multilevel degenerative changes seen throughout the lumbar spine IMPRESSION: 1. Partial distal small-bowel obstruction secondary to the presence of a 9.9 cm x 4.6 cm ventral hernia seen just above the level of the umbilicus. 2. Evidence of prior cholecystectomy. 3. Mild to moderate severity left basilar atelectasis and/or infiltrate with a small left pleural effusion. Electronically Signed: By: Virgina Norfolk M.D. On: 02/12/2019 19:18   DG Chest Portable 1 View  Result Date: 02/11/2019 CLINICAL DATA:  84 year old  male with shortness of breath. EXAM: PORTABLE CHEST 1 VIEW COMPARISON:  Chest radiograph dated 01/26/2009. FINDINGS: There is cardiomegaly with mild vascular congestion. Minimal left mid to lower lung field subpleural hazy densities may represent atelectasis. Developing atypical infection is not excluded. Clinical correlation is recommended. No focal consolidation, pleural effusion, pneumothorax. No acute osseous pathology. IMPRESSION: 1. Cardiomegaly with mild vascular congestion. 2. Left mid to lower lung field faint atelectasis versus atypical infection. Clinical correlation is recommended. No focal consolidation. Electronically Signed   By: Anner Crete M.D.   On:  02/11/2019 19:20   DG Abd Portable 1V  Result Date: 02/13/2019 CLINICAL DATA:  SBO EXAM: PORTABLE ABDOMEN - 1 VIEW COMPARISON:  CT abdomen/pelvis dated 02/12/2019 FINDINGS: Dilated loops of small bowel in the left mid abdomen. However, contrast opacifies throughout the colon to the level of the distal sigmoid colon. Enteric tube terminates in the proximal gastric body. Degenerative changes of the lumbar spine. IMPRESSION: Enteric tube terminates in the proximal gastric body. Dilated loops of small bowel in the left mid abdomen, likely related to the patient's partial small bowel obstruction noted on CT. However, contrast from recent CT now opacifies throughout the colon to the level of the distal sigmoid colon, confirming that this is only partially obstructive. Electronically Signed   By: Julian Hy M.D.   On: 02/13/2019 07:54   DG Abd Portable 1V  Result Date: 02/11/2019 CLINICAL DATA:  84 year old male with abdominal pain. EXAM: PORTABLE ABDOMEN - 1 VIEW COMPARISON:  None. FINDINGS: Several mildly dilated loops of small bowel measuring up to 3.4 cm in caliber concerning for small-bowel obstruction. Clinical correlation is recommended. Small-bowel series may provide better evaluation. Air is noted within the stomach. No free air identified. Right upper quadrant cholecystectomy clips. Degenerative changes of the spine. No acute osseous pathology. IMPRESSION: Mildly dilated small bowel loops concerning for obstruction. Clinical correlation follow-up recommended. Electronically Signed   By: Anner Crete M.D.   On: 02/11/2019 22:58   DG Abd Portable 2V  Result Date: 02/12/2019 CLINICAL DATA:  Small-bowel obstruction. EXAM: PORTABLE ABDOMEN - 2 VIEW COMPARISON:  Plain film of the abdomen dated 02/11/2019. FINDINGS: Again noted are mildly distended gas-filled loops of small bowel within the LEFT abdomen, not significantly changed compared to the previous exam. No distended bowel loops are seen  within the RIGHT abdomen or pelvis. No evidence of free intraperitoneal air. Lung bases are grossly clear. IMPRESSION: Persistent mildly distended gas-filled loops of small bowel within the LEFT abdomen, not significantly changed compared to the previous exam, compatible with partial small bowel obstruction versus ileus. Electronically Signed   By: Franki Cabot M.D.   On: 02/12/2019 13:49        Scheduled Meds: . digoxin  0.25 mg Intravenous Daily  . guaiFENesin  15 mL Per Tube Q6H  . metoprolol tartrate  5 mg Intravenous Q6H  . pantoprazole (PROTONIX) IV  40 mg Intravenous Q12H   Continuous Infusions: . sodium chloride    . amiodarone 30 mg/hr (02/13/19 0911)  . diltiazem (CARDIZEM) infusion 15 mg/hr (02/13/19 0755)  . heparin 1,350 Units/hr (02/13/19 0616)     LOS: 2 days    Time spent: 17min   Domenic Polite, MD [facility]   02/13/2019, 10:55 AM

## 2019-02-13 NOTE — Progress Notes (Signed)
Progress Note  Patient Name: Derek Foster Date of Encounter: 02/13/2019  Primary Cardiologist: No primary care provider on file.   Subjective   Feeling OK.  Denies palpitations.  His only complaint is NG tube discomfort.  Inpatient Medications    Scheduled Meds: . digoxin  0.25 mg Intravenous Daily  . guaiFENesin  15 mL Per Tube Q6H  . metoprolol tartrate  5 mg Intravenous Q6H  . pantoprazole (PROTONIX) IV  40 mg Intravenous Q12H   Continuous Infusions: . sodium chloride    . amiodarone 30 mg/hr (02/13/19 0911)  . diltiazem (CARDIZEM) infusion 15 mg/hr (02/13/19 0755)  . heparin 1,350 Units/hr (02/13/19 0616)   PRN Meds: acetaminophen **OR** acetaminophen, guaiFENesin-dextromethorphan, ondansetron (ZOFRAN) IV   Vital Signs    Vitals:   02/13/19 0800 02/13/19 0805 02/13/19 0900 02/13/19 1000  BP: 116/75 (!) 116/55 (!) 128/54 (!) 149/121  Pulse: (!) 127  (!) 120 98  Resp: 19  14 16   Temp: 98.4 F (36.9 C)     TempSrc: Oral     SpO2: 93%  94% 93%  Weight:      Height:        Intake/Output Summary (Last 24 hours) at 02/13/2019 1055 Last data filed at 02/13/2019 0600 Gross per 24 hour  Intake 3308.52 ml  Output 1900 ml  Net 1408.52 ml   Last 3 Weights 02/11/2019 06/16/2012 02/25/2011  Weight (lbs) 283 lb 282 lb 272 lb  Weight (kg) 128.368 kg 127.914 kg 123.378 kg      Telemetry    Atrial fibrillation. Rate 100s-140s - Personally Reviewed  ECG    - Personally Reviewed  Physical Exam   VS:  BP (!) 149/121   Pulse 98   Temp 98.4 F (36.9 C) (Oral)   Resp 16   Ht 6\' 1"  (1.854 m)   Wt 128.4 kg   SpO2 93%   BMI 37.34 kg/m  , BMI Body mass index is 37.34 kg/m. GENERAL:  Well appearing HEENT: NG tube in place.  Pupils equal round and reactive, fundi not visualized, oral mucosa unremarkable NECK:  No jugular venous distention, waveform within normal limits, carotid upstroke brisk and symmetric, no bruits LUNGS:  Clear to auscultation bilaterally HEART:   Tachycardic.  Irregularly irregular.  PMI not displaced or sustained,S1 and S2 within normal limits, no S3, no S4, no clicks, no rubs, no murmurs ABD:  Flat, positive bowel sounds normal in frequency in pitch, no bruits, no rebound, no guarding, no midline pulsatile mass, no hepatomegaly, no splenomegaly EXT:  2 plus pulses throughout, no edema, no cyanosis no clubbing SKIN:  No rashes no nodules NEURO:  Cranial nerves II through XII grossly intact, motor grossly intact throughout PSYCH:  Cognitively intact, oriented to person place and time   Labs    High Sensitivity Troponin:   Recent Labs  Lab 02/11/19 1836 02/11/19 2045  TROPONINIHS 10 10      Chemistry Recent Labs  Lab 02/11/19 1836 02/12/19 0544 02/13/19 0322  NA 145 143 142  K 4.7 3.9 3.5  CL 101 101 102  CO2 30 29 30   GLUCOSE 131* 138* 124*  BUN 43* 36* 27*  CREATININE 1.71* 1.46* 1.54*  CALCIUM 8.8* 8.4* 8.2*  PROT 6.8 6.1*  --   ALBUMIN 3.4* 3.0*  --   AST 19 15  --   ALT 22 20  --   ALKPHOS 78 69  --   BILITOT 0.7 0.8  --   [facility]  36* 44* 41*  GFRAA 42* 51* 48*  ANIONGAP 14 13 10      Hematology Recent Labs  Lab 02/11/19 1836 02/12/19 0544 02/13/19 0322  WBC 12.9* 10.6* 7.8  RBC 4.59 4.21* 4.11*  HGB 13.8 12.3* 12.2*  HCT 42.5 39.1 37.4*  MCV 92.6 92.9 91.0  MCH 30.1 29.2 29.7  MCHC 32.5 31.5 32.6  RDW 13.5 13.5 13.4  PLT 192 178 139*    BNPNo results for input(s): BNP, PROBNP in the last 168 hours.   DDimer No results for input(s): DDIMER in the last 168 hours.   Radiology    CT ABDOMEN PELVIS WO CONTRAST  Addendum Date: 02/12/2019   ADDENDUM REPORT: 02/12/2019 19:31 ADDENDUM: Results were discussed with Dr. Bobbye Morton at 7:28 p.m. [place] on February 12, 2019. Electronically Signed   By: Virgina Norfolk M.D.   On: 02/12/2019 19:31   Result Date: 02/12/2019 CLINICAL DATA:  Abdominal distension. EXAM: CT ABDOMEN AND PELVIS WITHOUT CONTRAST TECHNIQUE: Multidetector CT imaging of the  abdomen and pelvis was performed following the standard protocol without IV contrast. COMPARISON:  None. FINDINGS: Lower chest: Moderate severity atelectasis and/or infiltrate is seen along the posterolateral aspect of the left lung base. A small adjacent left pleural effusion is seen. Hepatobiliary: No focal liver abnormality is seen. Status post cholecystectomy. No biliary dilatation. Pancreas: Unremarkable. No pancreatic ductal dilatation or surrounding inflammatory changes. Spleen: Normal in size without focal abnormality. Adrenals/Urinary Tract: Adrenal glands are unremarkable. Kidneys are normal, without renal calculi or hydronephrosis. Multiple small bilateral parapelvic renal cysts are seen. Bladder is unremarkable. Stomach/Bowel: A nasogastric tube is seen with its distal tip noted within the body of the stomach. The appendix is not identified. Multiple dilated opacified small bowel loops are seen within the lower abdomen and pelvis (maximum small bowel diameter of approximately 3.8 cm). These dilated bowel loops extend into a fat containing ventral hernia (see below). Decompressed small bowel loops are seen exiting this ventral hernia. Vascular/Lymphatic: Marked severity aortic atherosclerosis. No enlarged abdominal or pelvic lymph nodes. Reproductive: The prostate gland is mildly enlarged. Other: A 7.8 cm x 2.8 cm fat containing ventral hernia is seen along the anterior aspect of the mid abdominal wall, to the left of midline. An additional 9.9 cm x 4.6 cm ventral hernia is seen within the region above the umbilicus, to the left of midline. This area of herniation contains fat and a small segment of small bowel. Musculoskeletal: Multilevel degenerative changes seen throughout the lumbar spine IMPRESSION: 1. Partial distal small-bowel obstruction secondary to the presence of a 9.9 cm x 4.6 cm ventral hernia seen just above the level of the umbilicus. 2. Evidence of prior cholecystectomy. 3. Mild to  moderate severity left basilar atelectasis and/or infiltrate with a small left pleural effusion. Electronically Signed: By: Virgina Norfolk M.D. On: 02/12/2019 19:18   DG Chest Portable 1 View  Result Date: 02/11/2019 CLINICAL DATA:  84 year old male with shortness of breath. EXAM: PORTABLE CHEST 1 VIEW COMPARISON:  Chest radiograph dated 01/26/2009. FINDINGS: There is cardiomegaly with mild vascular congestion. Minimal left mid to lower lung field subpleural hazy densities may represent atelectasis. Developing atypical infection is not excluded. Clinical correlation is recommended. No focal consolidation, pleural effusion, pneumothorax. No acute osseous pathology. IMPRESSION: 1. Cardiomegaly with mild vascular congestion. 2. Left mid to lower lung field faint atelectasis versus atypical infection. Clinical correlation is recommended. No focal consolidation. Electronically Signed   By: Anner Crete M.D.   On: 02/11/2019 19:20  DG Abd Portable 1V  Result Date: 02/13/2019 CLINICAL DATA:  SBO EXAM: PORTABLE ABDOMEN - 1 VIEW COMPARISON:  CT abdomen/pelvis dated 02/12/2019 FINDINGS: Dilated loops of small bowel in the left mid abdomen. However, contrast opacifies throughout the colon to the level of the distal sigmoid colon. Enteric tube terminates in the proximal gastric body. Degenerative changes of the lumbar spine. IMPRESSION: Enteric tube terminates in the proximal gastric body. Dilated loops of small bowel in the left mid abdomen, likely related to the patient's partial small bowel obstruction noted on CT. However, contrast from recent CT now opacifies throughout the colon to the level of the distal sigmoid colon, confirming that this is only partially obstructive. Electronically Signed   By: Julian Hy M.D.   On: 02/13/2019 07:54   DG Abd Portable 1V  Result Date: 02/11/2019 CLINICAL DATA:  84 year old male with abdominal pain. EXAM: PORTABLE ABDOMEN - 1 VIEW COMPARISON:  None. FINDINGS:  Several mildly dilated loops of small bowel measuring up to 3.4 cm in caliber concerning for small-bowel obstruction. Clinical correlation is recommended. Small-bowel series may provide better evaluation. Air is noted within the stomach. No free air identified. Right upper quadrant cholecystectomy clips. Degenerative changes of the spine. No acute osseous pathology. IMPRESSION: Mildly dilated small bowel loops concerning for obstruction. Clinical correlation follow-up recommended. Electronically Signed   By: Anner Crete M.D.   On: 02/11/2019 22:58   DG Abd Portable 2V  Result Date: 02/12/2019 CLINICAL DATA:  Small-bowel obstruction. EXAM: PORTABLE ABDOMEN - 2 VIEW COMPARISON:  Plain film of the abdomen dated 02/11/2019. FINDINGS: Again noted are mildly distended gas-filled loops of small bowel within the LEFT abdomen, not significantly changed compared to the previous exam. No distended bowel loops are seen within the RIGHT abdomen or pelvis. No evidence of free intraperitoneal air. Lung bases are grossly clear. IMPRESSION: Persistent mildly distended gas-filled loops of small bowel within the LEFT abdomen, not significantly changed compared to the previous exam, compatible with partial small bowel obstruction versus ileus. Electronically Signed   By: Franki Cabot M.D.   On: 02/12/2019 13:49    Cardiac Studies   Echo pending  Patient Profile     84 y.o. male with  CAD s/p PCI, hypertension, hyperlipidemia, and hypothyroidism admitted with new onset atrial flutter with RVR in setting of COVID-19 infection and SBO.  Assessment & Plan    # Atrial fibrillation: New this admission.  Mr. Kirschman remains in atrial fibrillation.  Rate is poorly controlled.  Increase metoprolol to 10mg  q6h.  Continue digoxin, amiodarone and diltiazem.  Heparin for anticoagulation. He isn't taking oral medication 2/2 SBO.  Check digoxin level in the AM.  Echo pending.   # CAD s/p PCI: Not an active issue.  Resume home  pravastatin and bisoprolol when able.  He will need anticoagulation, so ASA can be stopped.   # Hypertension:  Home bisoprolol, HCTZ, amlodipine, and irbesartan on hold.       For questions or updates, please contact [place] Please consult [email or web address] for contact info under        Signed, Skeet Latch, MD  02/13/2019, 10:55 AM

## 2019-02-13 NOTE — Progress Notes (Signed)
Patient ID: Derek Foster, male   DOB: Jul 17, 1935, 84 y.o.   MRN: [id number]   Acute Care Surgery Service Progress Note:    Chief Complaint/Subjective: Denies abd pain Some flatus but no BM Still in afib. On hep gtt Reports dr young fixed his abdominal hernia after cholecystectomy. Reports mesh was used.  About 1L NG output since yesterday - brown  Objective: Vital signs in last 24 hours: Temp:  [97.7 F (36.5 C)-98.5 F (36.9 C)] 97.7 F (36.5 C) (01/24 1200) Pulse Rate:  [98-134] 123 (01/24 1200) Resp:  [14-20] 16 (01/24 1200) BP: (116-149)/(54-121) 123/67 (01/24 1200) SpO2:  [91 %-98 %] 96 % (01/24 1200) Last BM Date: 02/11/19  Intake/Output from previous day: 01/23 0701 - 01/24 0700 In: 3791 [I.V.:2791; NG/GT:1000] Out: 1900 [Urine:600; Emesis/NG output:1300] Intake/Output this shift: No intake/output data recorded.  Lungs: cta, nonlabored  Cardiovascular: irreg, tachy  Abd: soft, nt, obese, +ventral hernia - soft, partially reducible  Extremities: no edema, +SCDs  Neuro: alert, nonfocal  Lab Results: CBC  Recent Labs    02/13/19 0322 02/13/19 1108  WBC 7.8 7.4  HGB 12.2* 10.7*  HCT 37.4* 33.9*  PLT 139* 131*   BMET Recent Labs    02/12/19 0544 02/13/19 0322  NA 143 142  K 3.9 3.5  CL 101 102  CO2 29 30  GLUCOSE 138* 124*  BUN 36* 27*  CREATININE 1.46* 1.54*  CALCIUM 8.4* 8.2*   LFT Hepatic Function Latest Ref Rng & Units 02/12/2019 02/11/2019 05/28/2011  Total Protein 6.5 - 8.1 g/dL 6.1(L) 6.8 6.5  Albumin 3.5 - 5.0 g/dL 3.0(L) 3.4(L) 4.1  AST 15 - 41 U/L 15 19 15   ALT 0 - 44 U/L 20 22 14   Alk Phosphatase 38 - 126 U/L 69 78 68  Total Bilirubin 0.3 - 1.2 mg/dL 0.8 0.7 0.5  Bilirubin, Direct 0.0 - 0.3 mg/dL - - 0.1   PT/INR No results for input(s): LABPROT, INR in the last 72 hours. ABG No results for input(s): PHART, HCO3 in the last 72 hours.  Invalid input(s): PCO2, PO2  Studies/Results:  Anti-infectives: Anti-infectives (From  admission, onward)   None      Medications: Scheduled Meds: . digoxin  0.25 mg Intravenous Daily  . guaiFENesin  15 mL Per Tube Q6H  . metoprolol tartrate  10 mg Intravenous Q6H  . pantoprazole (PROTONIX) IV  40 mg Intravenous Q12H   Continuous Infusions: . sodium chloride 50 mL/hr at 02/13/19 1056  . amiodarone 30 mg/hr (02/13/19 0911)  . diltiazem (CARDIZEM) infusion 15 mg/hr (02/13/19 0755)  . heparin 1,350 Units/hr (02/13/19 0616)   PRN Meds:.acetaminophen **OR** acetaminophen, guaiFENesin-dextromethorphan, ondansetron (ZOFRAN) IV  Assessment/Plan: Patient Active Problem List   Diagnosis Date Noted  . AKI (acute kidney injury) ([place]) 02/12/2019  . SBO (small bowel obstruction) ([place]) 02/12/2019  . CAD (coronary artery disease) 02/11/2019  . Atrial fibrillation with RVR ([place]) 02/11/2019  . Mixed hyperlipidemia 09/10/2009  . EDEMA 02/27/2009  . HYPOTHYROIDISM 02/26/2009  . Essential hypertension 02/26/2009  . MYOCARDIAL INFARCTION 02/26/2009   COVID POSITIVE 01/19/19 & 02/11/19 Aib/flutter with RVR - Cardizem/Amiodarone drips/Digoxin Hx MI with PTCA/Stents Hypertension Hyperlipidemia Hypothyroid BMI 37 SBO Recurrent ventral incisional hernia Recent loss of wife secondary to covid Recent prednisone taper  Reviewed his abdominal plain film this morning.  Contrast is in the sigmoid colon.  He still has dilated loops of small bowel.  No fever.  Leukocytosis.  Abdominal exam reassuring.  He does not have a complete  obstruction since contrast is in colon and he is having flatus but still has a fairly high NG tube output that is dark.  Would definitely like to avoid surgery during this admission given recent Covid diagnosis, recent steroid use, new onset A. fib and recent loss of his wife due to covid  Hopefully NG tube output will decrease and get less thick and less bilious and we can do clamping trials and see how he tolerates  Disposition: Continue bowel rest, NG tube  decompression, patient can have ice and sips of water;   LOS: 2 days    Leighton Ruff. Redmond Pulling, MD, FACS General, Bariatric, & Minimally Invasive Surgery [phone] [facility]

## 2019-02-14 LAB — BASIC METABOLIC PANEL
Anion gap: 10 (ref 5–15)
BUN: 20 mg/dL (ref 8–23)
CO2: 29 mmol/L (ref 22–32)
Calcium: 8.2 mg/dL — ABNORMAL LOW (ref 8.9–10.3)
Chloride: 101 mmol/L (ref 98–111)
Creatinine, Ser: 1.39 mg/dL — ABNORMAL HIGH (ref 0.61–1.24)
GFR calc Af Amer: 54 mL/min — ABNORMAL LOW (ref >60)
GFR calc non Af Amer: 47 mL/min — ABNORMAL LOW (ref >60)
Glucose, Bld: 111 mg/dL — ABNORMAL HIGH (ref 70–99)
Potassium: 3.1 mmol/L — ABNORMAL LOW (ref 3.5–5.1)
Sodium: 140 mmol/L (ref 135–145)

## 2019-02-14 LAB — HEPARIN LEVEL (UNFRACTIONATED)
Heparin Unfractionated: 0.89 IU/mL — ABNORMAL HIGH (ref 0.30–0.70)
Heparin Unfractionated: 0.65 IU/mL (ref 0.30–0.70)

## 2019-02-14 LAB — CBC
HCT: 38.2 % — ABNORMAL LOW (ref 39.0–52.0)
Hemoglobin: 12.3 g/dL — ABNORMAL LOW (ref 13.0–17.0)
MCH: 29.4 pg (ref 26.0–34.0)
MCHC: 32.2 g/dL (ref 30.0–36.0)
MCV: 91.4 fL (ref 80.0–100.0)
Platelets: 143 10*3/uL — ABNORMAL LOW (ref 150–400)
RBC: 4.18 MIL/uL — ABNORMAL LOW (ref 4.22–5.81)
RDW: 13.3 % (ref 11.5–15.5)
WBC: 10.2 10*3/uL (ref 4.0–10.5)
nRBC: 0 % (ref 0.0–0.2)

## 2019-02-14 LAB — DIGOXIN LEVEL: Digoxin Level: 0.9 ng/mL (ref 0.8–2.0)

## 2019-02-14 MED ORDER — FUROSEMIDE 10 MG/ML IJ SOLN
40.0000 mg | Freq: Once | INTRAMUSCULAR | Status: AC
  Administered 2019-02-14: 40 mg via INTRAVENOUS
  Filled 2019-02-14: qty 4

## 2019-02-14 MED ORDER — PHENOL 1.4 % MT LIQD: 1.0000 | OROMUCOSAL | Status: DC | PRN

## 2019-02-14 MED ORDER — POTASSIUM CHLORIDE 10 MEQ/100ML IV SOLN
10.0000 meq | INTRAVENOUS | Status: AC
  Administered 2019-02-14 (×4): 10 meq via INTRAVENOUS
  Filled 2019-02-14 (×4): qty 100

## 2019-02-14 NOTE — Progress Notes (Signed)
ANTICOAGULATION CONSULT NOTE - Follow Up Consult  Pharmacy Consult for Heparin Indication: atrial fibrillation  Allergies  Allergen Reactions  . Indomethacin Anaphylaxis    But tolerates ibuprofen, aleve  . Iodine-131 Anaphylaxis    Patient Measurements: Height: 6\' 1"  (185.4 cm) Weight: 283 lb (128.4 kg) IBW/kg (Calculated) : 79.9 Heparin Dosing Weight: 108.4 kg  Vital Signs: Temp: 98.7 F (37.1 C) (01/25 1100) Temp Source: Axillary (01/25 1100) BP: 152/76 (01/25 1300) Pulse Rate: 115 (01/25 1400)  Labs: Recent Labs    02/11/19 1836 02/11/19 1836 02/11/19 2045 02/12/19 0544 02/12/19 1238 02/13/19 0322 02/13/19 0322 02/13/19 1108 02/14/19 0730 02/14/19 0744 02/14/19 1502  HGB 13.8   < >  --  12.3*  --  12.2*   < > 10.7* 12.3*  --   --   HCT 42.5   < >  --  39.1  --  37.4*  --  33.9* 38.2*  --   --   PLT 192   < >  --  178  --  139*  --  131* 143*  --   --   HEPARINUNFRC  --   --   --  0.54   < > 0.58  --   --  0.89*  --  0.65  CREATININE 1.71*   < >  --  1.46*  --  1.54*  --   --   --  1.39*  --   TROPONINIHS 10  --  10  --   --   --   --   --   --   --   --    < > = values in this interval not displayed.    Estimated Creatinine Clearance: 57.5 mL/min (A) (by C-G formula based on SCr of 1.39 mg/dL (H)).   Assessment: Anticoag:Heparin for afib with RVR (new diagnosis). No Ac PTA. Hgb 12.3 stable. Plt 143 stable.  - 1/23 PM: RN said NG has brown/maroonish output - no fresh blood, but borderline old blood. - 1/25: RN reports copious dark red and thin bloody NG output (1325cc out 1/24) - HL 0.89 elevated>>0.65 now in goal range. No output recorded for today yet - Per cards cont IV hep for now (SBO)  - CHAd2 Vasc score 4   Goal of Therapy:  Heparin level 0.3-0.7 units/ml Monitor platelets by anticoagulation protocol: Yes   Plan:  Con't IV heparin to 1100 units/hr Daily HL and CBC - Will have to monitor Hgb and copious bloody NG output closely. Daily HL,  CBC   Avonelle Viveros S. Alford Highland, PharmD, BCPS Clinical Staff Pharmacist [email or web address] Alford Highland, Caprisha Bridgett Stillinger 02/14/2019,4:18 PM

## 2019-02-14 NOTE — Progress Notes (Signed)
I asked pt if he would like me to call family, and he said no, he has already spoken to several people, including his daughter in law who is a Marine scientist at [employer].

## 2019-02-14 NOTE — Progress Notes (Signed)
cardizem titrated to 10mg /hr according to new vitals, Dr. Broadus John was notified.

## 2019-02-14 NOTE — Progress Notes (Addendum)
Subjective: CC: SBO Patient reports that his abdominal pain, distension and nausea have resolved over the last 48 hours.  He reports that he had flatus yesterday as well as this morning.  No BM.  He has not gotten out of bed.  NG tube output still greater than 1 L, however patient reports that he had 3-4 cups of ice water since yesterday morning.   Objective: Vital signs in last 24 hours: Temp:  [97.1 F (36.2 C)-98.5 F (36.9 C)] 98 F (36.7 C) (01/25 0801) Pulse Rate:  [92-127] 111 (01/25 0801) Resp:  [14-18] 18 (01/25 0801) BP: (120-161)/(54-121) 152/79 (01/25 0801) SpO2:  [91 %-97 %] 95 % (01/25 0801) Last BM Date: 02/11/19  Intake/Output from previous day: 01/24 0701 - 01/25 0700 In: 1367.4 [P.O.:125; I.V.:1042.4; NG/GT:200] Out: [id number] [Urine:1050; Emesis/NG output:1325] Intake/Output this shift: Total I/O In: -  Out: 50 [Urine:50]  PE: Gen:  Alert, NAD, pleasant HEENT: NG tube in place.  There is dark brown output in canister.  He is currently on LIWS Pulm: normal rate and effort  Abd: Soft, ND, NT, +BS, there is a noted left-sided ventral hernia that is easily reducible and the patient lies flat.  This area is not tender to palpation. Midline wound from prior surgery noted and well healed.   Lab Results:  Recent Labs    02/13/19 0322 02/13/19 1108  WBC 7.8 7.4  HGB 12.2* 10.7*  HCT 37.4* 33.9*  PLT 139* 131*   BMET Recent Labs    02/12/19 0544 02/13/19 0322  NA 143 142  K 3.9 3.5  CL 101 102  CO2 29 30  GLUCOSE 138* 124*  BUN 36* 27*  CREATININE 1.46* 1.54*  CALCIUM 8.4* 8.2*   PT/INR No results for input(s): LABPROT, INR in the last 72 hours. CMP     Component Value Date/Time   NA 142 02/13/2019 0322   K 3.5 02/13/2019 0322   CL 102 02/13/2019 0322   CO2 30 02/13/2019 0322   GLUCOSE 124 (H) 02/13/2019 0322   BUN 27 (H) 02/13/2019 0322   CREATININE 1.54 (H) 02/13/2019 0322   CALCIUM 8.2 (L) 02/13/2019 0322   PROT 6.1 (L) 02/12/2019  0544   ALBUMIN 3.0 (L) 02/12/2019 0544   AST 15 02/12/2019 0544   ALT 20 02/12/2019 0544   ALKPHOS 69 02/12/2019 0544   BILITOT 0.8 02/12/2019 0544   GFRNONAA 41 (L) 02/13/2019 0322   GFRAA 48 (L) 02/13/2019 0322   Lipase  No results found for: LIPASE     Studies/Results: CT ABDOMEN PELVIS WO CONTRAST  Addendum Date: 02/12/2019   ADDENDUM REPORT: 02/12/2019 19:31 ADDENDUM: Results were discussed with Dr. Bobbye Morton at 7:28 p.m. [place] on February 12, 2019. Electronically Signed   By: Virgina Norfolk M.D.   On: 02/12/2019 19:31   Result Date: 02/12/2019 CLINICAL DATA:  Abdominal distension. EXAM: CT ABDOMEN AND PELVIS WITHOUT CONTRAST TECHNIQUE: Multidetector CT imaging of the abdomen and pelvis was performed following the standard protocol without IV contrast. COMPARISON:  None. FINDINGS: Lower chest: Moderate severity atelectasis and/or infiltrate is seen along the posterolateral aspect of the left lung base. A small adjacent left pleural effusion is seen. Hepatobiliary: No focal liver abnormality is seen. Status post cholecystectomy. No biliary dilatation. Pancreas: Unremarkable. No pancreatic ductal dilatation or surrounding inflammatory changes. Spleen: Normal in size without focal abnormality. Adrenals/Urinary Tract: Adrenal glands are unremarkable. Kidneys are normal, without renal calculi or hydronephrosis. Multiple small bilateral parapelvic renal  cysts are seen. Bladder is unremarkable. Stomach/Bowel: A nasogastric tube is seen with its distal tip noted within the body of the stomach. The appendix is not identified. Multiple dilated opacified small bowel loops are seen within the lower abdomen and pelvis (maximum small bowel diameter of approximately 3.8 cm). These dilated bowel loops extend into a fat containing ventral hernia (see below). Decompressed small bowel loops are seen exiting this ventral hernia. Vascular/Lymphatic: Marked severity aortic atherosclerosis. No enlarged abdominal  or pelvic lymph nodes. Reproductive: The prostate gland is mildly enlarged. Other: A 7.8 cm x 2.8 cm fat containing ventral hernia is seen along the anterior aspect of the mid abdominal wall, to the left of midline. An additional 9.9 cm x 4.6 cm ventral hernia is seen within the region above the umbilicus, to the left of midline. This area of herniation contains fat and a small segment of small bowel. Musculoskeletal: Multilevel degenerative changes seen throughout the lumbar spine IMPRESSION: 1. Partial distal small-bowel obstruction secondary to the presence of a 9.9 cm x 4.6 cm ventral hernia seen just above the level of the umbilicus. 2. Evidence of prior cholecystectomy. 3. Mild to moderate severity left basilar atelectasis and/or infiltrate with a small left pleural effusion. Electronically Signed: By: Virgina Norfolk M.D. On: 02/12/2019 19:18   DG Abd Portable 1V  Result Date: 02/13/2019 CLINICAL DATA:  SBO EXAM: PORTABLE ABDOMEN - 1 VIEW COMPARISON:  CT abdomen/pelvis dated 02/12/2019 FINDINGS: Dilated loops of small bowel in the left mid abdomen. However, contrast opacifies throughout the colon to the level of the distal sigmoid colon. Enteric tube terminates in the proximal gastric body. Degenerative changes of the lumbar spine. IMPRESSION: Enteric tube terminates in the proximal gastric body. Dilated loops of small bowel in the left mid abdomen, likely related to the patient's partial small bowel obstruction noted on CT. However, contrast from recent CT now opacifies throughout the colon to the level of the distal sigmoid colon, confirming that this is only partially obstructive. Electronically Signed   By: Julian Hy M.D.   On: 02/13/2019 07:54   DG Abd Portable 2V  Result Date: 02/12/2019 CLINICAL DATA:  Small-bowel obstruction. EXAM: PORTABLE ABDOMEN - 2 VIEW COMPARISON:  Plain film of the abdomen dated 02/11/2019. FINDINGS: Again noted are mildly distended gas-filled loops of small  bowel within the LEFT abdomen, not significantly changed compared to the previous exam. No distended bowel loops are seen within the RIGHT abdomen or pelvis. No evidence of free intraperitoneal air. Lung bases are grossly clear. IMPRESSION: Persistent mildly distended gas-filled loops of small bowel within the LEFT abdomen, not significantly changed compared to the previous exam, compatible with partial small bowel obstruction versus ileus. Electronically Signed   By: Franki Cabot M.D.   On: 02/12/2019 13:49   ECHOCARDIOGRAM COMPLETE  Result Date: 02/13/2019   ECHOCARDIOGRAM REPORT   Patient Name:   [facility] Date of Exam: 02/13/2019 Medical Rec #:  [id number]     Height:       73.0 in Accession #:    [id number]    Weight:       283.0 lb Date of Birth:  May 31, 1935      BSA:          2.49 m Patient Age:    55 years      BP:           123/67 mmHg Patient Gender: M  HR:           100 bpm. Exam Location:  Inpatient Procedure: 2D Echo, Intracardiac Opacification Agent, Color Doppler and Cardiac            Doppler Indications:    Atrial Fibrillation 427.31  History:        Patient has prior history of Echocardiogram examinations, most                 recent 01/29/2009. Previous Myocardial Infarction. Covid 19                 positive.  Sonographer:    Merrie Roof RDCS Referring Phys: [place]  1. Left ventricular ejection fraction, by visual estimation, is 60 to 65%. The left ventricle has normal function. There is moderately increased left ventricular hypertrophy.  2. Definity contrast agent was given IV to delineate the left ventricular endocardial borders.  3. Left ventricular diastolic parameters are indeterminate.  4. The left ventricle has no regional wall motion abnormalities.  5. Global right ventricle has normal systolic function.The right ventricular size is normal. Right vetricular wall thickness was not assessed.  6. Left atrial size was severely dilated.  7. Right  atrial size was normal.  8. The mitral valve is grossly normal. No evidence of mitral valve regurgitation.  9. The tricuspid valve is grossly normal. 10. The tricuspid valve is grossly normal. Tricuspid valve regurgitation is mild. 11. The aortic valve is tricuspid. Aortic valve regurgitation is not visualized. No evidence of aortic valve sclerosis or stenosis. 12. The pulmonic valve was grossly normal. Pulmonic valve regurgitation is not visualized. 13. Mildly elevated pulmonary artery systolic pressure. 14. The interatrial septum was not well visualized. FINDINGS  Left Ventricle: Left ventricular ejection fraction, by visual estimation, is 60 to 65%. The left ventricle has normal function. Definity contrast agent was given IV to delineate the left ventricular endocardial borders. The left ventricle has no regional wall motion abnormalities. The left ventricular internal cavity size was the left ventricle is normal in size. There is moderately increased left ventricular hypertrophy. Concentric left ventricular hypertrophy. Left ventricular diastolic parameters are indeterminate. Right Ventricle: The right ventricular size is normal. Right vetricular wall thickness was not assessed. Global RV systolic function is has normal systolic function. The tricuspid regurgitant velocity is 2.65 m/s, and with an assumed right atrial pressure of 3 mmHg, the estimated right ventricular systolic pressure is mildly elevated at 31.1 mmHg. Left Atrium: Left atrial size was severely dilated. Right Atrium: Right atrial size was normal in size Pericardium: There is no evidence of pericardial effusion. Mitral Valve: The mitral valve is grossly normal. No evidence of mitral valve regurgitation. Tricuspid Valve: The tricuspid valve is grossly normal. Tricuspid valve regurgitation is mild. Aortic Valve: The aortic valve is tricuspid. Aortic valve regurgitation is not visualized. The aortic valve is structurally normal, with no evidence of  sclerosis or stenosis. Mild aortic valve annular calcification. Pulmonic Valve: The pulmonic valve was grossly normal. Pulmonic valve regurgitation is not visualized. Pulmonic regurgitation is not visualized. Aorta: The aortic root is normal in size and structure. Venous: The inferior vena cava was not well visualized. IAS/Shunts: The interatrial septum was not well visualized.  LEFT VENTRICLE PLAX 2D LVIDd:         4.80 cm       Diastology LVIDs:         3.30 cm       LV e' lateral: 14.40 cm/s LV  PW:         1.30 cm LV IVS:        1.40 cm LVOT diam:     2.00 cm LV SV:         63 ml LV SV Index:   24.21 LVOT Area:     3.14 cm  LV Volumes (MOD) LV area d, A2C:    25.20 cm LV area d, A4C:    28.70 cm LV area s, A2C:    11.10 cm LV area s, A4C:    17.40 cm LV major d, A2C:   7.62 cm LV major d, A4C:   7.89 cm LV major s, A2C:   5.17 cm LV major s, A4C:   6.14 cm LV vol d, MOD A2C: 66.5 ml LV vol d, MOD A4C: 88.5 ml LV vol s, MOD A2C: 19.0 ml LV vol s, MOD A4C: 40.1 ml LV SV MOD A2C:     47.5 ml LV SV MOD A4C:     88.5 ml LV SV MOD BP:      47.2 ml RIGHT VENTRICLE RV S prime:     15.40 cm/s TAPSE (M-mode): 2.2 cm LEFT ATRIUM              Index LA diam:        4.40 cm  1.76 cm/m LA Vol (A2C):   135.0 ml 54.14 ml/m LA Vol (A4C):   167.0 ml 66.97 ml/m LA Biplane Vol: 152.0 ml 60.95 ml/m  AORTIC VALVE LVOT Vmax:   87.90 cm/s LVOT Vmean:  57.800 cm/s LVOT VTI:    0.156 m  AORTA Ao Root diam: 3.10 cm Ao Asc diam:  3.30 cm TRICUSPID VALVE TR Peak grad:   28.1 mmHg TR Vmax:        265.00 cm/s  SHUNTS Systemic VTI:  0.16 m Systemic Diam: 2.00 cm  Kate Sable MD Electronically signed by Kate Sable MD Signature Date/Time: 02/13/2019/3:18:50 PM    Final     Anti-infectives: Anti-infectives (From admission, onward)   None       Assessment/Plan COVID POSITIVE 01/19/19 & 02/11/19 Aib/flutter with RVR - Herpain/Cardizem/Amiodarone drips/Digoxin. Echo 1/24 w/ EF 60-65% Hx MI with  PTCA/Stents Hypertension Hyperlipidemia Hypothyroid BMI 37 Recent loss of wife secondary to covid Recent prednisone taper  SBO Recurrent ventral incisional hernia - Hernia is reducible  - Contrast is in the sigmoid colon on xray from 1/24 - Clamping trial of NGT today. Allow sips of clears from the floor - Keep K<4 and Mg>2 for bowel function - Ambulate as able from our standpoint for bowel function - Hopefully he continues to improve. Would like to avoid surgery during this admission given recent Covid diagnosis, recent steroid use and new onset A. Fib  FEN: Clamp NGT. Sips of clears from the floor VTE: SCDs, Heprain gtt ID: None   LOS: 3 days    Jillyn Ledger , [facility] 02/14/2019, 8:10 AM Please see Amion for pager number during day hours 7:00am-4:30pm

## 2019-02-14 NOTE — Progress Notes (Signed)
ANTICOAGULATION CONSULT NOTE - Follow Up Consult  Pharmacy Consult for Heparin Indication: atrial fibrillation  Allergies  Allergen Reactions  . Indomethacin Anaphylaxis    But tolerates ibuprofen, aleve  . Iodine-131 Anaphylaxis    Patient Measurements: Height: 6\' 1"  (185.4 cm) Weight: 283 lb (128.4 kg) IBW/kg (Calculated) : 79.9 Heparin Dosing Weight: 108.4 kg  Vital Signs: Temp: 98 F (36.7 C) (01/25 0801) Temp Source: Oral (01/25 0801) BP: 152/79 (01/25 0801) Pulse Rate: 111 (01/25 0801)  Labs: Recent Labs    02/11/19 1836 02/11/19 1836 02/11/19 2045 02/12/19 0544 02/12/19 0544 02/12/19 1238 02/12/19 2203 02/13/19 0322 02/13/19 0322 02/13/19 1108 02/14/19 0730  HGB 13.8   < >  --  12.3*   < >  --   --  12.2*   < > 10.7* 12.3*  HCT 42.5   < >  --  39.1   < >  --   --  37.4*  --  33.9* 38.2*  PLT 192   < >  --  178   < >  --   --  139*  --  131* 143*  HEPARINUNFRC  --   --   --  0.54  --    < > 0.61 0.58  --   --  0.89*  CREATININE 1.71*  --   --  1.46*  --   --   --  1.54*  --   --   --   TROPONINIHS 10  --  10  --   --   --   --   --   --   --   --    < > = values in this interval not displayed.    Estimated Creatinine Clearance: 51.9 mL/min (A) (by C-G formula based on SCr of 1.54 mg/dL (H)).   Assessment: Anticoag:Heparin for afib with RVR (new diagnosis). No Ac PTA. Hgb 12.3 stable. Plt 143 stable.  - 1/23 PM: RN said NG has brown/maroonish output - no fresh blood, but borderline old blood. - 1/25: RN reports copious dark red and thin bloody NG output (1325cc out 1/24) - HL 0.89 elevated on heparin 1350 units/hr - Per cards cont IV hep for now (SBO)  - CHAd2 Vasc score 4   Goal of Therapy:  Heparin level 0.3-0.7 units/ml Monitor platelets by anticoagulation protocol: Yes   Plan:  Decrease IV heparin to 1100 units/hr. Recheck in 6 hrs. - Will have to monitor Hgb and copious bloody NG output closely. Daily HL, CBC   Ileanna Gemmill S. Alford Highland,  PharmD, BCPS Clinical Staff Pharmacist [email or web address] Alford Highland, [employer] 02/14/2019,8:36 AM

## 2019-02-14 NOTE — Progress Notes (Signed)
Pt has not felt well enough to stand and walk to chair today.  Pt said he was using a wheelchair at home since he had covid.  Md is aware and said she will order PT consult when HR is more normalized.   Pt has been using flutter valve regularly and has been coughing and deep breathing.   Doesn't want anything to drink now due to bloating.

## 2019-02-14 NOTE — Progress Notes (Signed)
Urinating frequently after receiving Lasix. States unable to use urinal without spilling urine in bed. Condom cath applied.

## 2019-02-14 NOTE — Progress Notes (Signed)
PROGRESS NOTE    Derek Foster  [id number] DOB: 1935-11-26 DOA: 02/11/2019 PCP: Derek Squibb, MD    Brief Narrative:  HPI per Dr. Waldron Labs:  Derek Foster  is a 84 y.o. male, with past medical history of CAD, status post stent in 2011, hypertension, hyperlipidemia, GERD, hypothyroidism, patient presents with complaints of abdominal pain, nausea, vomiting for last 24 hours, patient report overall generalized weakness, since his recent Covid diagnosis 12/30,(patient's wife passed away from COVID-69 2 weeks ago), patient denies any chest pain, fever, chills, hemoptysis, diarrhea, as well patient reports he has generalized weakness since his COVID-19 diagnosis, has been mainly using his wheelchair, as well he reports poor appetite, did not require admission or any treatment for his COVID-19. - in ED patient was noted to be in A. fib with RVR, which is new diagnosis, where he required Cardizem drip, initial heart rate 162, as well patient was started on heparin GTT, patient on baby aspirin daily for history of CAD, patient was afebrile, ED, but mild leukocytosis of 12.9, he denies any dysuria or cough, patient creatinine was noted to be elevated at 1.7, no recent baseline to compare, heart rate has improved in the 130s with Cardizem drip, I was called to admit.   Assessment & Plan:   New onset atrial fibrillation/flutter with rapid ventricular response -Remains in persistent rapid A. fib despite IV amiodarone, Cardizem gtt. -Also continue IV metoprolol and digoxin -Continue IV heparin, has some coffee-ground NG output however hemoglobin is stable, continue PPI and monitor -2D echocardiogram with preserved EF -TSH/T4 consistent with hyperthyroidism we will start low-dose oral Tapazole when able to tolerate p.o. -Cardiology following  Small bowel obstruction Recurrent ventral incisional hernia -Likely secondary to adhesions from prior surgeries and ventral hernia -Yesterday's KUB noted contrast  in the colon suggesting partial obstruction, no bowel movements yet, general surgery following, they have clamped NG tube and started clears -Attempt to try to avoid surgery given rapid A. fib, recent Covid etc. -Out of bed to chair if possible -Replace K  Acute kidney injury -Likely secondary to GI losses -Improved, stop IV fluids today as he is starting clears, he did have small pleural effusion on x-ray yesterday  Recent COVID-19 infection -Patient reports being diagnosed on 12/30 at his PCP Dr. Josue Foster office completed 3-week isolation period on 1/21, repeat Covid PCR was positive on 1/22, suspect this is residual viral fragments -Was asymptomatic from this  History of CAD -Remote history of stents -Stable  Hypertension -Currently on Cardizem drip and IV metoprolol, -BP is stable  GERD -IV PPI  DVT prophylaxis: heparin infusion Code Status: DNR Family Communication: none present, left msg for [employer], will attempt to contact again later Disposition Plan: Pending resolution of SBO, A. fib RVR, may need SNF   Consultants:   Cardiology  General surgery  Procedures:     Antimicrobials:       Subjective: -NG tube having brown output, passing flatus, no bowel movement yet Denies any shortness of breath, extremely uncomfortable with having the tube  Objective: Vitals:   02/14/19 0600 02/14/19 0801 02/14/19 0830 02/14/19 1100  BP: (!) 147/87 (!) 152/79    Pulse: (!) 104 (!) 111 (!) 124   Resp: 17 18 19    Temp:  98 F (36.7 C)  98.7 F (37.1 C)  TempSrc:  Oral  Axillary  SpO2: 93% 95% 96%   Weight:      Height:        Intake/Output  Summary (Last 24 hours) at 02/14/2019 1232 Last data filed at 02/14/2019 [id number] Gross per 24 hour  Intake 1286.61 ml  Output 2425 ml  Net -1138.39 ml   Filed Weights   02/11/19 1832  Weight: 128.4 kg    Examination:  Gen: Pleasant elderly male, laying in bed, slightly uncomfortable, AAOx3, no distress HEENT: NG  tube with brown output  lungs: Decreased breath sounds at the bases, otherwise clear CVS: S1-S2, irregularly irregular rhythm Abd: Soft, mildly distended, nontender, bowel sounds diminished but present Extremities: No edema Skin: no new rashes Psychiatry: Judgement and insight appear normal. Mood & affect appropriate.     Data Reviewed: I have personally reviewed following labs and imaging studies  CBC: Recent Labs  Lab 02/11/19 1836 02/12/19 0544 02/13/19 0322 02/13/19 1108 02/14/19 0730  WBC 12.9* 10.6* 7.8 7.4 10.2  NEUTROABS 10.1*  --   --   --   --   HGB 13.8 12.3* 12.2* 10.7* 12.3*  HCT 42.5 39.1 37.4* 33.9* 38.2*  MCV 92.6 92.9 91.0 93.1 91.4  PLT 192 178 139* 131* [id number]*   Basic Metabolic Panel: Recent Labs  Lab 02/11/19 1836 02/12/19 0544 02/13/19 0322 02/13/19 1108 02/14/19 0744  NA 145 143 142  --  140  K 4.7 3.9 3.5  --  3.1*  CL 101 101 102  --  101  CO2 30 29 30   --  29  GLUCOSE 131* 138* 124*  --  111*  BUN 43* 36* 27*  --  20  CREATININE 1.71* 1.46* 1.54*  --  1.39*  CALCIUM 8.8* 8.4* 8.2*  --  8.2*  MG  --   --   --  1.9  --    GFR: Estimated Creatinine Clearance: 57.5 mL/min (A) (by C-G formula based on SCr of 1.39 mg/dL (H)). Liver Function Tests: Recent Labs  Lab 02/11/19 1836 02/12/19 0544  AST 19 15  ALT 22 20  ALKPHOS 78 69  BILITOT 0.7 0.8  PROT 6.8 6.1*  ALBUMIN 3.4* 3.0*   No results for input(s): LIPASE, AMYLASE in the last 168 hours. No results for input(s): AMMONIA in the last 168 hours. Coagulation Profile: No results for input(s): INR, PROTIME in the last 168 hours. Cardiac Enzymes: No results for input(s): CKTOTAL, CKMB, CKMBINDEX, TROPONINI in the last 168 hours. BNP (last 3 results) No results for input(s): PROBNP in the last 8760 hours. HbA1C: No results for input(s): HGBA1C in the last 72 hours. CBG: No results for input(s): GLUCAP in the last 168 hours. Lipid Profile: No results for input(s): CHOL, HDL,  LDLCALC, TRIG, CHOLHDL, LDLDIRECT in the last 72 hours. Thyroid Function Tests: Recent Labs    02/11/19 2045 02/12/19 0544  TSH 0.026*  --   FREET4  --  1.76*  T3FREE  --  3.4   Anemia Panel: No results for input(s): VITAMINB12, FOLATE, FERRITIN, TIBC, IRON, RETICCTPCT in the last 72 hours. Sepsis Labs: No results for input(s): PROCALCITON, LATICACIDVEN in the last 168 hours.  Recent Results (from the past 240 hour(s))  Respiratory Panel by RT PCR (Flu A&B, Covid) - Nasopharyngeal Swab     Status: Abnormal   Collection Time: 02/11/19  7:10 PM   Specimen: Nasopharyngeal Swab  Result Value Ref Range Status   SARS Coronavirus 2 by RT PCR POSITIVE (A) NEGATIVE Final    Comment: CRITICAL RESULT CALLED TO, READ BACK BY AND VERIFIED WITH: ANDREWS,L. AT 2018 ON 02/11/2019 BY EVA (NOTE) SARS-CoV-2 target nucleic acids are DETECTED.  SARS-CoV-2 RNA is generally detectable in upper respiratory specimens  during the acute phase of infection. Positive results are indicative of the presence of the identified virus, but do not rule out bacterial infection or co-infection with other pathogens not detected by the test. Clinical correlation with patient history and other diagnostic information is necessary to determine patient infection status. The expected result is Negative. Fact Sheet for Patients:  PinkCheek.be Fact Sheet for Healthcare Providers: GravelBags.it This test is not yet approved or cleared by the [place] FDA and  has been authorized for detection and/or diagnosis of SARS-CoV-2 by FDA under an Emergency Use Authorization (EUA).  This EUA will remain in effect (meaning this test c an be used) for the duration of  the COVID-19 declaration under Section 564(b)(1) of the Act, 21 U.S.C. section 360bbb-3(b)(1), unless the authorization is terminated or revoked sooner.    Influenza A by PCR NEGATIVE NEGATIVE Final    Influenza B by PCR NEGATIVE NEGATIVE Final    Comment: (NOTE) The Xpert Xpress SARS-CoV-2/FLU/RSV assay is intended as an aid in  the diagnosis of influenza from Nasopharyngeal swab specimens and  should not be used as a sole basis for treatment. Nasal washings and  aspirates are unacceptable for Xpert Xpress SARS-CoV-2/FLU/RSV  testing. Fact Sheet for Patients: PinkCheek.be Fact Sheet for Healthcare Providers: GravelBags.it This test is not yet approved or cleared by the [place] FDA and  has been authorized for detection and/or diagnosis of SARS-CoV-2 by  FDA under an Emergency Use Authorization (EUA). This EUA will remain  in effect (meaning this test can be used) for the duration of the  Covid-19 declaration under Section 564(b)(1) of the Act, 21  U.S.C. section 360bbb-3(b)(1), unless the authorization is  terminated or revoked. Performed at [facility], [address], [place], [place] [address]          Radiology Studies: CT ABDOMEN PELVIS WO CONTRAST  Addendum Date: 02/12/2019   ADDENDUM REPORT: 02/12/2019 19:31 ADDENDUM: Results were discussed with Dr. Bobbye Morton at 7:28 p.m. [place] on February 12, 2019. Electronically Signed   By: Virgina Norfolk M.D.   On: 02/12/2019 19:31   Result Date: 02/12/2019 CLINICAL DATA:  Abdominal distension. EXAM: CT ABDOMEN AND PELVIS WITHOUT CONTRAST TECHNIQUE: Multidetector CT imaging of the abdomen and pelvis was performed following the standard protocol without IV contrast. COMPARISON:  None. FINDINGS: Lower chest: Moderate severity atelectasis and/or infiltrate is seen along the posterolateral aspect of the left lung base. A small adjacent left pleural effusion is seen. Hepatobiliary: No focal liver abnormality is seen. Status post cholecystectomy. No biliary dilatation. Pancreas: Unremarkable. No pancreatic ductal dilatation or surrounding inflammatory changes. Spleen: Normal in  size without focal abnormality. Adrenals/Urinary Tract: Adrenal glands are unremarkable. Kidneys are normal, without renal calculi or hydronephrosis. Multiple small bilateral parapelvic renal cysts are seen. Bladder is unremarkable. Stomach/Bowel: A nasogastric tube is seen with its distal tip noted within the body of the stomach. The appendix is not identified. Multiple dilated opacified small bowel loops are seen within the lower abdomen and pelvis (maximum small bowel diameter of approximately 3.8 cm). These dilated bowel loops extend into a fat containing ventral hernia (see below). Decompressed small bowel loops are seen exiting this ventral hernia. Vascular/Lymphatic: Marked severity aortic atherosclerosis. No enlarged abdominal or pelvic lymph nodes. Reproductive: The prostate gland is mildly enlarged. Other: A 7.8 cm x 2.8 cm fat containing ventral hernia is seen along the anterior aspect of the mid abdominal wall, to the  left of midline. An additional 9.9 cm x 4.6 cm ventral hernia is seen within the region above the umbilicus, to the left of midline. This area of herniation contains fat and a small segment of small bowel. Musculoskeletal: Multilevel degenerative changes seen throughout the lumbar spine IMPRESSION: 1. Partial distal small-bowel obstruction secondary to the presence of a 9.9 cm x 4.6 cm ventral hernia seen just above the level of the umbilicus. 2. Evidence of prior cholecystectomy. 3. Mild to moderate severity left basilar atelectasis and/or infiltrate with a small left pleural effusion. Electronically Signed: By: Virgina Norfolk M.D. On: 02/12/2019 19:18   DG Abd Portable 1V  Result Date: 02/13/2019 CLINICAL DATA:  SBO EXAM: PORTABLE ABDOMEN - 1 VIEW COMPARISON:  CT abdomen/pelvis dated 02/12/2019 FINDINGS: Dilated loops of small bowel in the left mid abdomen. However, contrast opacifies throughout the colon to the level of the distal sigmoid colon. Enteric tube terminates in the  proximal gastric body. Degenerative changes of the lumbar spine. IMPRESSION: Enteric tube terminates in the proximal gastric body. Dilated loops of small bowel in the left mid abdomen, likely related to the patient's partial small bowel obstruction noted on CT. However, contrast from recent CT now opacifies throughout the colon to the level of the distal sigmoid colon, confirming that this is only partially obstructive. Electronically Signed   By: Julian Hy M.D.   On: 02/13/2019 07:54   DG Abd Portable 2V  Result Date: 02/12/2019 CLINICAL DATA:  Small-bowel obstruction. EXAM: PORTABLE ABDOMEN - 2 VIEW COMPARISON:  Plain film of the abdomen dated 02/11/2019. FINDINGS: Again noted are mildly distended gas-filled loops of small bowel within the LEFT abdomen, not significantly changed compared to the previous exam. No distended bowel loops are seen within the RIGHT abdomen or pelvis. No evidence of free intraperitoneal air. Lung bases are grossly clear. IMPRESSION: Persistent mildly distended gas-filled loops of small bowel within the LEFT abdomen, not significantly changed compared to the previous exam, compatible with partial small bowel obstruction versus ileus. Electronically Signed   By: Franki Cabot M.D.   On: 02/12/2019 13:49   ECHOCARDIOGRAM COMPLETE  Result Date: 02/13/2019   ECHOCARDIOGRAM REPORT   Patient Name:   JACSEN POWLES [facility] Date of Exam: 02/13/2019 Medical Rec #:  [id number]     Height:       73.0 in Accession #:    [id number]    Weight:       283.0 lb Date of Birth:  18-May-1935      BSA:          2.49 m Patient Age:    87 years      BP:           123/67 mmHg Patient Gender: M             HR:           100 bpm. Exam Location:  Inpatient Procedure: 2D Echo, Intracardiac Opacification Agent, Color Doppler and Cardiac            Doppler Indications:    Atrial Fibrillation 427.31  History:        Patient has prior history of Echocardiogram examinations, most                 recent 01/29/2009.  Previous Myocardial Infarction. Covid 19                 positive.  Sonographer:    [place] Referring Phys: [place]  ELGER[place] IMPRESSIONS  1. Left ventricular ejection fraction, by visual estimation, is 60 to 65%. The left ventricle has normal function. There is moderately increased left ventricular hypertrophy.  2. Definity contrast agent was given IV to delineate the left ventricular endocardial borders.  3. Left ventricular diastolic parameters are indeterminate.  4. The left ventricle has no regional wall motion abnormalities.  5. Global right ventricle has normal systolic function.The right ventricular size is normal. Right vetricular wall thickness was not assessed.  6. Left atrial size was severely dilated.  7. Right atrial size was normal.  8. The mitral valve is grossly normal. No evidence of mitral valve regurgitation.  9. The tricuspid valve is grossly normal. 10. The tricuspid valve is grossly normal. Tricuspid valve regurgitation is mild. 11. The aortic valve is tricuspid. Aortic valve regurgitation is not visualized. No evidence of aortic valve sclerosis or stenosis. 12. The pulmonic valve was grossly normal. Pulmonic valve regurgitation is not visualized. 13. Mildly elevated pulmonary artery systolic pressure. 14. The interatrial septum was not well visualized. FINDINGS  Left Ventricle: Left ventricular ejection fraction, by visual estimation, is 60 to 65%. The left ventricle has normal function. Definity contrast agent was given IV to delineate the left ventricular endocardial borders. The left ventricle has no regional wall motion abnormalities. The left ventricular internal cavity size was the left ventricle is normal in size. There is moderately increased left ventricular hypertrophy. Concentric left ventricular hypertrophy. Left ventricular diastolic parameters are indeterminate. Right Ventricle: The right ventricular size is normal. Right vetricular wall thickness was not  assessed. Global RV systolic function is has normal systolic function. The tricuspid regurgitant velocity is 2.65 m/s, and with an assumed right atrial pressure of 3 mmHg, the estimated right ventricular systolic pressure is mildly elevated at 31.1 mmHg. Left Atrium: Left atrial size was severely dilated. Right Atrium: Right atrial size was normal in size Pericardium: There is no evidence of pericardial effusion. Mitral Valve: The mitral valve is grossly normal. No evidence of mitral valve regurgitation. Tricuspid Valve: The tricuspid valve is grossly normal. Tricuspid valve regurgitation is mild. Aortic Valve: The aortic valve is tricuspid. Aortic valve regurgitation is not visualized. The aortic valve is structurally normal, with no evidence of sclerosis or stenosis. Mild aortic valve annular calcification. Pulmonic Valve: The pulmonic valve was grossly normal. Pulmonic valve regurgitation is not visualized. Pulmonic regurgitation is not visualized. Aorta: The aortic root is normal in size and structure. Venous: The inferior vena cava was not well visualized. IAS/Shunts: The interatrial septum was not well visualized.  LEFT VENTRICLE PLAX 2D LVIDd:         4.80 cm       Diastology LVIDs:         3.30 cm       LV e' lateral: 14.40 cm/s LV PW:         1.30 cm LV IVS:        1.40 cm LVOT diam:     2.00 cm LV SV:         63 ml LV SV Index:   24.21 LVOT Area:     3.14 cm  LV Volumes (MOD) LV area d, A2C:    25.20 cm LV area d, A4C:    28.70 cm LV area s, A2C:    11.10 cm LV area s, A4C:    17.40 cm LV major d, A2C:   7.62 cm LV major d, A4C:   7.89 cm LV major s, A2C:  5.17 cm LV major s, A4C:   6.14 cm LV vol d, MOD A2C: 66.5 ml LV vol d, MOD A4C: 88.5 ml LV vol s, MOD A2C: 19.0 ml LV vol s, MOD A4C: 40.1 ml LV SV MOD A2C:     47.5 ml LV SV MOD A4C:     88.5 ml LV SV MOD BP:      47.2 ml RIGHT VENTRICLE RV S prime:     15.40 cm/s TAPSE (M-mode): 2.2 cm LEFT ATRIUM              Index LA diam:        4.40 cm  1.76  cm/m LA Vol (A2C):   135.0 ml 54.14 ml/m LA Vol (A4C):   167.0 ml 66.97 ml/m LA Biplane Vol: 152.0 ml 60.95 ml/m  AORTIC VALVE LVOT Vmax:   87.90 cm/s LVOT Vmean:  57.800 cm/s LVOT VTI:    0.156 m  AORTA Ao Root diam: 3.10 cm Ao Asc diam:  3.30 cm TRICUSPID VALVE TR Peak grad:   28.1 mmHg TR Vmax:        265.00 cm/s  SHUNTS Systemic VTI:  0.16 m Systemic Diam: 2.00 cm  Kate Sable MD Electronically signed by Kate Sable MD Signature Date/Time: 02/13/2019/3:18:50 PM    Final         Scheduled Meds: . digoxin  0.25 mg Intravenous Daily  . guaiFENesin  15 mL Per Tube Q6H  . metoprolol tartrate  10 mg Intravenous Q6H  . pantoprazole (PROTONIX) IV  40 mg Intravenous Q12H   Continuous Infusions: . amiodarone 30 mg/hr (02/14/19 0823)  . diltiazem (CARDIZEM) infusion 15 mg/hr (02/14/19 1210)  . heparin 1,100 Units/hr (02/14/19 0934)  . potassium chloride       LOS: 3 days    Time spent: 40min   Domenic Polite, MD [facility]   02/14/2019, 12:32 PM

## 2019-02-14 NOTE — Progress Notes (Signed)
Progress Note  Patient Name: Derek Foster Date of Encounter: 02/14/2019  Primary Cardiologist: Skeet Latch, MD   Subjective   Small bowel obstruction, abdominal pain has improved. Felt a little bit dizzy when getting up but overall no chest pain no significant shortness of breath  Inpatient Medications    Scheduled Meds: . digoxin  0.25 mg Intravenous Daily  . guaiFENesin  15 mL Per Tube Q6H  . metoprolol tartrate  10 mg Intravenous Q6H  . pantoprazole (PROTONIX) IV  40 mg Intravenous Q12H   Continuous Infusions: . sodium chloride Stopped (02/14/19 0823)  . amiodarone 30 mg/hr (02/14/19 0823)  . diltiazem (CARDIZEM) infusion 5 mg/hr (02/14/19 0821)  . heparin 1,100 Units/hr (02/14/19 0934)   PRN Meds: acetaminophen **OR** acetaminophen, guaiFENesin-dextromethorphan, ondansetron (ZOFRAN) IV   Vital Signs    Vitals:   02/14/19 0500 02/14/19 0600 02/14/19 0801 02/14/19 0830  BP: (!) 149/96 (!) 147/87 (!) 152/79   Pulse: (!) 103 (!) 104 (!) 111 (!) 124  Resp: 16 17 18 19   Temp:   98 F (36.7 C)   TempSrc:   Oral   SpO2: 92% 93% 95% 96%  Weight:      Height:        Intake/Output Summary (Last 24 hours) at 02/14/2019 1044 Last data filed at 02/14/2019 [id number] Gross per 24 hour  Intake 1286.61 ml  Output 2425 ml  Net -1138.39 ml   Last 3 Weights 02/11/2019 06/16/2012 02/25/2011  Weight (lbs) 283 lb 282 lb 272 lb  Weight (kg) 128.368 kg 127.914 kg 123.378 kg      Telemetry    Atrial fibrillation, burst of RVR- Personally Reviewed  ECG    A. fib- Personally Reviewed  Physical Exam   In order to reduce COVID-19 exposure risk, exam from earlier today reviewed.  Communication with patient took place via telephonic transmission.  irregularly irregular heart rhythm.  Spoke with him on telephone, able to complete full sentences without difficulty.  Labs    High Sensitivity Troponin:   Recent Labs  Lab 02/11/19 1836 02/11/19 2045  TROPONINIHS 10 10       Chemistry Recent Labs  Lab 02/11/19 1836 02/11/19 1836 02/12/19 0544 02/13/19 0322 02/14/19 0744  NA 145   < > 143 142 140  K 4.7   < > 3.9 3.5 3.1*  CL 101   < > 101 102 101  CO2 30   < > 29 30 29   GLUCOSE 131*   < > 138* 124* 111*  BUN 43*   < > 36* 27* 20  CREATININE 1.71*   < > 1.46* 1.54* 1.39*  CALCIUM 8.8*   < > 8.4* 8.2* 8.2*  PROT 6.8  --  6.1*  --   --   ALBUMIN 3.4*  --  3.0*  --   --   AST 19  --  15  --   --   ALT 22  --  20  --   --   ALKPHOS 78  --  69  --   --   BILITOT 0.7  --  0.8  --   --   GFRNONAA 36*   < > 44* 41* 47*  GFRAA 42*   < > 51* 48* 54*  ANIONGAP 14   < > 13 10 10    < > = values in this interval not displayed.     Hematology Recent Labs  Lab 02/13/19 0322 02/13/19 1108 02/14/19 0730  WBC 7.8  7.4 10.2  RBC 4.11* 3.64* 4.18*  HGB 12.2* 10.7* 12.3*  HCT 37.4* 33.9* 38.2*  MCV 91.0 93.1 91.4  MCH 29.7 29.4 29.4  MCHC 32.6 31.6 32.2  RDW 13.4 13.3 13.3  PLT 139* 131* 143*    BNPNo results for input(s): BNP, PROBNP in the last 168 hours.   DDimer No results for input(s): DDIMER in the last 168 hours.   Radiology    CT ABDOMEN PELVIS WO CONTRAST  Addendum Date: 02/12/2019   ADDENDUM REPORT: 02/12/2019 19:31 ADDENDUM: Results were discussed with Dr. Bobbye Morton at 7:28 p.m. [place] on February 12, 2019. Electronically Signed   By: Virgina Norfolk M.D.   On: 02/12/2019 19:31   Result Date: 02/12/2019 CLINICAL DATA:  Abdominal distension. EXAM: CT ABDOMEN AND PELVIS WITHOUT CONTRAST TECHNIQUE: Multidetector CT imaging of the abdomen and pelvis was performed following the standard protocol without IV contrast. COMPARISON:  None. FINDINGS: Lower chest: Moderate severity atelectasis and/or infiltrate is seen along the posterolateral aspect of the left lung base. A small adjacent left pleural effusion is seen. Hepatobiliary: No focal liver abnormality is seen. Status post cholecystectomy. No biliary dilatation. Pancreas: Unremarkable. No  pancreatic ductal dilatation or surrounding inflammatory changes. Spleen: Normal in size without focal abnormality. Adrenals/Urinary Tract: Adrenal glands are unremarkable. Kidneys are normal, without renal calculi or hydronephrosis. Multiple small bilateral parapelvic renal cysts are seen. Bladder is unremarkable. Stomach/Bowel: A nasogastric tube is seen with its distal tip noted within the body of the stomach. The appendix is not identified. Multiple dilated opacified small bowel loops are seen within the lower abdomen and pelvis (maximum small bowel diameter of approximately 3.8 cm). These dilated bowel loops extend into a fat containing ventral hernia (see below). Decompressed small bowel loops are seen exiting this ventral hernia. Vascular/Lymphatic: Marked severity aortic atherosclerosis. No enlarged abdominal or pelvic lymph nodes. Reproductive: The prostate gland is mildly enlarged. Other: A 7.8 cm x 2.8 cm fat containing ventral hernia is seen along the anterior aspect of the mid abdominal wall, to the left of midline. An additional 9.9 cm x 4.6 cm ventral hernia is seen within the region above the umbilicus, to the left of midline. This area of herniation contains fat and a small segment of small bowel. Musculoskeletal: Multilevel degenerative changes seen throughout the lumbar spine IMPRESSION: 1. Partial distal small-bowel obstruction secondary to the presence of a 9.9 cm x 4.6 cm ventral hernia seen just above the level of the umbilicus. 2. Evidence of prior cholecystectomy. 3. Mild to moderate severity left basilar atelectasis and/or infiltrate with a small left pleural effusion. Electronically Signed: By: Virgina Norfolk M.D. On: 02/12/2019 19:18   DG Abd Portable 1V  Result Date: 02/13/2019 CLINICAL DATA:  SBO EXAM: PORTABLE ABDOMEN - 1 VIEW COMPARISON:  CT abdomen/pelvis dated 02/12/2019 FINDINGS: Dilated loops of small bowel in the left mid abdomen. However, contrast opacifies throughout the  colon to the level of the distal sigmoid colon. Enteric tube terminates in the proximal gastric body. Degenerative changes of the lumbar spine. IMPRESSION: Enteric tube terminates in the proximal gastric body. Dilated loops of small bowel in the left mid abdomen, likely related to the patient's partial small bowel obstruction noted on CT. However, contrast from recent CT now opacifies throughout the colon to the level of the distal sigmoid colon, confirming that this is only partially obstructive. Electronically Signed   By: Julian Hy M.D.   On: 02/13/2019 07:54   DG Abd Portable 2V  Result Date:  02/12/2019 CLINICAL DATA:  Small-bowel obstruction. EXAM: PORTABLE ABDOMEN - 2 VIEW COMPARISON:  Plain film of the abdomen dated 02/11/2019. FINDINGS: Again noted are mildly distended gas-filled loops of small bowel within the LEFT abdomen, not significantly changed compared to the previous exam. No distended bowel loops are seen within the RIGHT abdomen or pelvis. No evidence of free intraperitoneal air. Lung bases are grossly clear. IMPRESSION: Persistent mildly distended gas-filled loops of small bowel within the LEFT abdomen, not significantly changed compared to the previous exam, compatible with partial small bowel obstruction versus ileus. Electronically Signed   By: Franki Cabot M.D.   On: 02/12/2019 13:49   ECHOCARDIOGRAM COMPLETE  Result Date: 02/13/2019   ECHOCARDIOGRAM REPORT   Patient Name:   Derek Foster [facility] Date of Exam: 02/13/2019 Medical Rec #:  [id number]     Height:       73.0 in Accession #:    [id number]    Weight:       283.0 lb Date of Birth:  07-Apr-1935      BSA:          2.49 m Patient Age:    84 years      BP:           123/67 mmHg Patient Gender: M             HR:           100 bpm. Exam Location:  Inpatient Procedure: 2D Echo, Intracardiac Opacification Agent, Color Doppler and Cardiac            Doppler Indications:    Atrial Fibrillation 427.31  History:        Patient has prior  history of Echocardiogram examinations, most                 recent 01/29/2009. Previous Myocardial Infarction. Covid 19                 positive.  Sonographer:    Merrie Roof RDCS Referring Phys: [place]  1. Left ventricular ejection fraction, by visual estimation, is 60 to 65%. The left ventricle has normal function. There is moderately increased left ventricular hypertrophy.  2. Definity contrast agent was given IV to delineate the left ventricular endocardial borders.  3. Left ventricular diastolic parameters are indeterminate.  4. The left ventricle has no regional wall motion abnormalities.  5. Global right ventricle has normal systolic function.The right ventricular size is normal. Right vetricular wall thickness was not assessed.  6. Left atrial size was severely dilated.  7. Right atrial size was normal.  8. The mitral valve is grossly normal. No evidence of mitral valve regurgitation.  9. The tricuspid valve is grossly normal. 10. The tricuspid valve is grossly normal. Tricuspid valve regurgitation is mild. 11. The aortic valve is tricuspid. Aortic valve regurgitation is not visualized. No evidence of aortic valve sclerosis or stenosis. 12. The pulmonic valve was grossly normal. Pulmonic valve regurgitation is not visualized. 13. Mildly elevated pulmonary artery systolic pressure. 14. The interatrial septum was not well visualized. FINDINGS  Left Ventricle: Left ventricular ejection fraction, by visual estimation, is 60 to 65%. The left ventricle has normal function. Definity contrast agent was given IV to delineate the left ventricular endocardial borders. The left ventricle has no regional wall motion abnormalities. The left ventricular internal cavity size was the left ventricle is normal in size. There is moderately increased left ventricular hypertrophy. Concentric left ventricular hypertrophy. Left ventricular  diastolic parameters are indeterminate. Right Ventricle: The  right ventricular size is normal. Right vetricular wall thickness was not assessed. Global RV systolic function is has normal systolic function. The tricuspid regurgitant velocity is 2.65 m/s, and with an assumed right atrial pressure of 3 mmHg, the estimated right ventricular systolic pressure is mildly elevated at 31.1 mmHg. Left Atrium: Left atrial size was severely dilated. Right Atrium: Right atrial size was normal in size Pericardium: There is no evidence of pericardial effusion. Mitral Valve: The mitral valve is grossly normal. No evidence of mitral valve regurgitation. Tricuspid Valve: The tricuspid valve is grossly normal. Tricuspid valve regurgitation is mild. Aortic Valve: The aortic valve is tricuspid. Aortic valve regurgitation is not visualized. The aortic valve is structurally normal, with no evidence of sclerosis or stenosis. Mild aortic valve annular calcification. Pulmonic Valve: The pulmonic valve was grossly normal. Pulmonic valve regurgitation is not visualized. Pulmonic regurgitation is not visualized. Aorta: The aortic root is normal in size and structure. Venous: The inferior vena cava was not well visualized. IAS/Shunts: The interatrial septum was not well visualized.  LEFT VENTRICLE PLAX 2D LVIDd:         4.80 cm       Diastology LVIDs:         3.30 cm       LV e' lateral: 14.40 cm/s LV PW:         1.30 cm LV IVS:        1.40 cm LVOT diam:     2.00 cm LV SV:         63 ml LV SV Index:   24.21 LVOT Area:     3.14 cm  LV Volumes (MOD) LV area d, A2C:    25.20 cm LV area d, A4C:    28.70 cm LV area s, A2C:    11.10 cm LV area s, A4C:    17.40 cm LV major d, A2C:   7.62 cm LV major d, A4C:   7.89 cm LV major s, A2C:   5.17 cm LV major s, A4C:   6.14 cm LV vol d, MOD A2C: 66.5 ml LV vol d, MOD A4C: 88.5 ml LV vol s, MOD A2C: 19.0 ml LV vol s, MOD A4C: 40.1 ml LV SV MOD A2C:     47.5 ml LV SV MOD A4C:     88.5 ml LV SV MOD BP:      47.2 ml RIGHT VENTRICLE RV S prime:     15.40 cm/s TAPSE  (M-mode): 2.2 cm LEFT ATRIUM              Index LA diam:        4.40 cm  1.76 cm/m LA Vol (A2C):   135.0 ml 54.14 ml/m LA Vol (A4C):   167.0 ml 66.97 ml/m LA Biplane Vol: 152.0 ml 60.95 ml/m  AORTIC VALVE LVOT Vmax:   87.90 cm/s LVOT Vmean:  57.800 cm/s LVOT VTI:    0.156 m  AORTA Ao Root diam: 3.10 cm Ao Asc diam:  3.30 cm TRICUSPID VALVE TR Peak grad:   28.1 mmHg TR Vmax:        265.00 cm/s  SHUNTS Systemic VTI:  0.16 m Systemic Diam: 2.00 cm  Kate Sable MD Electronically signed by Kate Sable MD Signature Date/Time: 02/13/2019/3:18:50 PM    Final     Cardiac Studies   Echo EF 65%.  Patient Profile     84 y.o. male new onset atrial fibrillation persistent  with SBO recurrent ventral incisional hernia.  Assessment & Plan    A. fib with RVR persistent -Today he is trialing sips of clears.  Hopefully will be able to transition to p.o. medicine soon.  For now continue with IV amiodarone, IV digoxin 0.25 daily, IV metoprolol 10 every 6. -Digoxin level therapeutic at 0.9  Acute kidney injury -Creatinine seems to be improving steadily.  Lab work reviewed.  Small bowel obstruction -Agree with surgery that we should try to avoid operation if possible given his COVID-19 infection. -Seems to be clinically slowly improving from abdominal perspective.      For questions or updates, please contact [place] Please consult [email or web address] for contact info under        Signed, Candee Furbish, MD  02/14/2019, 10:44 AM

## 2019-02-15 ENCOUNTER — Inpatient Hospital Stay (HOSPITAL_COMMUNITY): Payer: PPO

## 2019-02-15 LAB — BASIC METABOLIC PANEL
Anion gap: 14 (ref 5–15)
BUN: 20 mg/dL (ref 8–23)
CO2: 28 mmol/L (ref 22–32)
Calcium: 8.3 mg/dL — ABNORMAL LOW (ref 8.9–10.3)
Chloride: 96 mmol/L — ABNORMAL LOW (ref 98–111)
Creatinine, Ser: 1.54 mg/dL — ABNORMAL HIGH (ref 0.61–1.24)
GFR calc Af Amer: 48 mL/min — ABNORMAL LOW (ref >60)
GFR calc non Af Amer: 41 mL/min — ABNORMAL LOW (ref >60)
Glucose, Bld: 130 mg/dL — ABNORMAL HIGH (ref 70–99)
Potassium: 3.3 mmol/L — ABNORMAL LOW (ref 3.5–5.1)
Sodium: 138 mmol/L (ref 135–145)

## 2019-02-15 LAB — CBC
HCT: 38.9 % — ABNORMAL LOW (ref 39.0–52.0)
Hemoglobin: 13 g/dL (ref 13.0–17.0)
MCH: 29.5 pg (ref 26.0–34.0)
MCHC: 33.4 g/dL (ref 30.0–36.0)
MCV: 88.2 fL (ref 80.0–100.0)
Platelets: 158 10*3/uL (ref 150–400)
RBC: 4.41 MIL/uL (ref 4.22–5.81)
RDW: 13.1 % (ref 11.5–15.5)
WBC: 15.3 10*3/uL — ABNORMAL HIGH (ref 4.0–10.5)
nRBC: 0 % (ref 0.0–0.2)

## 2019-02-15 LAB — HEPARIN LEVEL (UNFRACTIONATED): Heparin Unfractionated: 0.37 IU/mL (ref 0.30–0.70)

## 2019-02-15 MED ORDER — DIGOXIN 0.25 MG/ML IJ SOLN
0.1250 mg | Freq: Every day | INTRAMUSCULAR | Status: DC
  Administered 2019-02-16 – 2019-02-20 (×5): 0.125 mg via INTRAVENOUS
  Filled 2019-02-15 (×5): qty 2

## 2019-02-15 MED ORDER — MENTHOL 3 MG MT LOZG: 1.0000 | LOZENGE | OROMUCOSAL | Status: DC | PRN

## 2019-02-15 MED ORDER — SODIUM CHLORIDE 0.9 % IV SOLN: INTRAVENOUS | Status: DC

## 2019-02-15 MED ORDER — METOPROLOL TARTRATE 5 MG/5ML IV SOLN
10.0000 mg | INTRAVENOUS | Status: DC
  Administered 2019-02-15 – 2019-02-20 (×30): 10 mg via INTRAVENOUS
  Filled 2019-02-15 (×31): qty 10

## 2019-02-15 MED ORDER — POTASSIUM CHLORIDE 10 MEQ/100ML IV SOLN
10.0000 meq | INTRAVENOUS | Status: AC
  Administered 2019-02-15 (×4): 10 meq via INTRAVENOUS
  Filled 2019-02-15 (×4): qty 100

## 2019-02-15 MED ORDER — FUROSEMIDE 10 MG/ML IJ SOLN
20.0000 mg | Freq: Once | INTRAMUSCULAR | Status: AC
  Administered 2019-02-15: 20 mg via INTRAVENOUS
  Filled 2019-02-15: qty 2

## 2019-02-15 NOTE — Progress Notes (Signed)
PROGRESS NOTE    Derek Foster  [id number] DOB: [birth year] DOA: 02/11/2019 PCP: Derek Squibb, MD    Brief Narrative:  HPI per Derek Foster Labs:  Derek Foster  is a 84 y.o. male, with past medical history of CAD, status post stent in 2011, hypertension, hyperlipidemia, GERD, hypothyroidism,  presented with complaints of abdominal pain, nausea, vomiting x1 day, patient report overall generalized weakness, since his recent Covid diagnosis 12/29,(patient's wife passed away from COVID-53 2 weeks ago), he denied any chest pain, fever, chills, hemoptysis, diarrhea, due to weakness he had been mainly using his wheelchair, - did not require admission or any treatment for his COVID-19. - in ED patient was noted to be in A. fib with RVR, with HR in 140-160s and SBO -transferred to [facility]  -Afib RVR now on Amio gtt, cardizem gtt, IV metoprolol and digoxin, cardiology following -Also noted to have small bowel obstruction secondary to ventral hernia and adhesions, status post NG decompression and bowel rest  Assessment & Plan:   New onset atrial fibrillation/flutter with rapid ventricular response -Remains in persistent rapid A. fib despite IV amiodarone, Cardizem gtt. -Also continue IV metoprolol and digoxin -Continue IV heparin, has some coffee-ground NG output however hemoglobin is stable, continue PPI and monitor -2D echocardiogram with preserved EF -TSH/T4 consistent with hyperthyroidism will start low-dose oral Tapazole when able to tolerate p.o. -Cardiology following  Small bowel obstruction Recurrent ventral incisional hernia -Likely secondary to adhesions from prior surgeries and ventral hernia -previous KUB noted contrast in the colon suggesting partial obstruction, no bowel movements yet, general surgery following, failed NG clamping trial yesterday, he had multiple episodes of vomiting overnight, NG tube back to intermittent low wall suction -General surgery following -Out of bed to chair as  tolerated -Replace potassium  Acute kidney injury -Likely secondary to GI losses -slightly improved  Recent COVID-19 infection -Patient reports being diagnosed on 12/29 at his PCP Derek Foster office completed 3-week isolation period on 1/21, repeat Covid PCR was positive on 1/22, suspect this is residual viral fragments, I called Derek Foster office and was able to confirm original positive Covid diagnosis on 12/29 -Did have generalized weakness secondary to this without respiratory symptoms  History of CAD -Remote history of stents -Stable  Hypertension -Currently on Cardizem drip and IV metoprolol, -BP is stable  GERD -IV PPI  DVT prophylaxis: heparin infusion Code Status: DNR Family Communication: none present, left msg for daughter-in-law Derek Foster 1/25 and 1/26, will attempt to contact again later Disposition Plan: Pending resolution of SBO, A. fib RVR, may need SNF   Consultants:   Cardiology  General surgery  Procedures:     Antimicrobials:       Subjective: -NG tube back to suction again, passing flatus, no BMs  -Complains of cough, which was much worse last night, denies any shortness of breath  -Continues to be uncomfortable with the NG tube  Objective: Vitals:   02/15/19 0700 02/15/19 0823 02/15/19 0900 02/15/19 1148  BP: (!) 150/90 122/67 (!) 155/103 137/63  Pulse: 98 (!) 136 (!) 122 96  Resp: (!) 21 19 (!) 23 12  Temp:  97.9 F (36.6 C)  98.6 F (37 C)  TempSrc:  Oral  Oral  SpO2: 91% 95% 93% 95%  Weight:      Height:        Intake/Output Summary (Last 24 hours) at 02/15/2019 1156 Last data filed at 02/15/2019 1030 Gross per 24 hour  Intake 1919.61 ml  Output 2335 ml  Net -415.39 ml   Filed Weights   02/11/19 1832  Weight: 128.4 kg    Examination:  Gen: Pleasant elderly male sitting up in bed, ill-appearing, awake alert oriented x3 HEENT: NG tube with coffee-ground output Lungs: Poor air movement, basilar crackles  noted CVS: S1-S2, regular rate rhythm Abd: soft, Non tender, non distended, BS present Extremities: No edema Skin: no new rashes Psychiatry: Judgement and insight appear normal.   Data Reviewed: I have personally reviewed following labs and imaging studies  CBC: Recent Labs  Lab 02/11/19 1836 02/11/19 1836 02/12/19 0544 02/13/19 0322 02/13/19 1108 02/14/19 0730 02/15/19 0504  WBC 12.9*   < > 10.6* 7.8 7.4 10.2 15.3*  NEUTROABS 10.1*  --   --   --   --   --   --   HGB 13.8   < > 12.3* 12.2* 10.7* 12.3* 13.0  HCT 42.5   < > 39.1 37.4* 33.9* 38.2* 38.9*  MCV 92.6   < > 92.9 91.0 93.1 91.4 88.2  PLT 192   < > 178 139* 131* 143* 158   < > = values in this interval not displayed.   Basic Metabolic Panel: Recent Labs  Lab 02/11/19 1836 02/12/19 0544 02/13/19 0322 02/13/19 1108 02/14/19 0744 02/15/19 0504  NA 145 143 142  --  140 138  K 4.7 3.9 3.5  --  3.1* 3.3*  CL 101 101 102  --  101 96*  CO2 30 29 30   --  29 28  GLUCOSE 131* 138* 124*  --  111* 130*  BUN 43* 36* 27*  --  20 20  CREATININE 1.71* 1.46* 1.54*  --  1.39* 1.54*  CALCIUM 8.8* 8.4* 8.2*  --  8.2* 8.3*  MG  --   --   --  1.9  --   --    GFR: Estimated Creatinine Clearance: 51.9 mL/min (A) (by C-G formula based on SCr of 1.54 mg/dL (H)). Liver Function Tests: Recent Labs  Lab 02/11/19 1836 02/12/19 0544  AST 19 15  ALT 22 20  ALKPHOS 78 69  BILITOT 0.7 0.8  PROT 6.8 6.1*  ALBUMIN 3.4* 3.0*   No results for input(s): LIPASE, AMYLASE in the last 168 hours. No results for input(s): AMMONIA in the last 168 hours. Coagulation Profile: No results for input(s): INR, PROTIME in the last 168 hours. Cardiac Enzymes: No results for input(s): CKTOTAL, CKMB, CKMBINDEX, TROPONINI in the last 168 hours. BNP (last 3 results) No results for input(s): PROBNP in the last 8760 hours. HbA1C: No results for input(s): HGBA1C in the last 72 hours. CBG: No results for input(s): GLUCAP in the last 168 hours. Lipid  Profile: No results for input(s): CHOL, HDL, LDLCALC, TRIG, CHOLHDL, LDLDIRECT in the last 72 hours. Thyroid Function Tests: No results for input(s): TSH, T4TOTAL, FREET4, T3FREE, THYROIDAB in the last 72 hours. Anemia Panel: No results for input(s): VITAMINB12, FOLATE, FERRITIN, TIBC, IRON, RETICCTPCT in the last 72 hours. Sepsis Labs: No results for input(s): PROCALCITON, LATICACIDVEN in the last 168 hours.  Recent Results (from the past 240 hour(s))  Respiratory Panel by RT PCR (Flu A&B, Covid) - Nasopharyngeal Swab     Status: Abnormal   Collection Time: 02/11/19  7:10 PM   Specimen: Nasopharyngeal Swab  Result Value Ref Range Status   SARS Coronavirus 2 by RT PCR POSITIVE (A) NEGATIVE Final    Comment: CRITICAL RESULT CALLED TO, READ BACK BY AND VERIFIED WITH: ANDREWS,L. AT  2018 ON 02/11/2019 BY EVA (NOTE) SARS-CoV-2 target nucleic acids are DETECTED. SARS-CoV-2 RNA is generally detectable in upper respiratory specimens  during the acute phase of infection. Positive results are indicative of the presence of the identified virus, but do not rule out bacterial infection or co-infection with other pathogens not detected by the test. Clinical correlation with patient history and other diagnostic information is necessary to determine patient infection status. The expected result is Negative. Fact Sheet for Patients:  PinkCheek.be Fact Sheet for Healthcare Providers: GravelBags.it This test is not yet approved or cleared by the [place] FDA and  has been authorized for detection and/or diagnosis of SARS-CoV-2 by FDA under an Emergency Use Authorization (EUA).  This EUA will remain in effect (meaning this test c an be used) for the duration of  the COVID-19 declaration under Section 564(b)(1) of the Act, 21 U.S.C. section 360bbb-3(b)(1), unless the authorization is terminated or revoked sooner.    Influenza A by PCR  NEGATIVE NEGATIVE Final   Influenza B by PCR NEGATIVE NEGATIVE Final    Comment: (NOTE) The Xpert Xpress SARS-CoV-2/FLU/RSV assay is intended as an aid in  the diagnosis of influenza from Nasopharyngeal swab specimens and  should not be used as a sole basis for treatment. Nasal washings and  aspirates are unacceptable for Xpert Xpress SARS-CoV-2/FLU/RSV  testing. Fact Sheet for Patients: PinkCheek.be Fact Sheet for Healthcare Providers: GravelBags.it This test is not yet approved or cleared by the [place] FDA and  has been authorized for detection and/or diagnosis of SARS-CoV-2 by  FDA under an Emergency Use Authorization (EUA). This EUA will remain  in effect (meaning this test can be used) for the duration of the  Covid-19 declaration under Section 564(b)(1) of the Act, 21  U.S.C. section 360bbb-3(b)(1), unless the authorization is  terminated or revoked. Performed at [facility], [address] [place], [place] [address]          Radiology Studies: DG CHEST PORT 1 VIEW  Result Date: 02/15/2019 CLINICAL DATA:  Cough, COVID positive 02/11/2019, hypertension, coronary artery disease, history MI EXAM: PORTABLE CHEST 1 VIEW COMPARISON:  Portable exam 0740 hours compared to 1901 hours FINDINGS: Nasogastric tube extends into stomach. Borderline enlargement of cardiac silhouette. Mediastinal contours and pulmonary vascularity normal. Atherosclerotic calcification aorta. LEFT upper lobe infiltrate consistent with pneumonia. Minimal RIGHT basilar atelectasis. No pleural effusion or pneumothorax. IMPRESSION: LEFT upper lobe infiltrate consistent with pneumonia. Electronically Signed   By: Lavonia Dana M.D.   On: 02/15/2019 07:56   ECHOCARDIOGRAM COMPLETE  Result Date: 02/13/2019   ECHOCARDIOGRAM REPORT   Patient Name:   TREYSHAWN GERRY [facility] Date of Exam: 02/13/2019 Medical Rec #:  [id number]     Height:       73.0 in Accession #:     [id number]    Weight:       283.0 lb Date of Birth:  Nov 18, 1935      BSA:          2.49 m Patient Age:    79 years      BP:           123/67 mmHg Patient Gender: M             HR:           100 bpm. Exam Location:  Inpatient Procedure: 2D Echo, Intracardiac Opacification Agent, Color Doppler and Cardiac            Doppler Indications:    Atrial  Fibrillation 427.31  History:        Patient has prior history of Echocardiogram examinations, most                 recent 01/29/2009. Previous Myocardial Infarction. Covid 19                 positive.  Sonographer:    Merrie Roof RDCS Referring Phys: [place]  1. Left ventricular ejection fraction, by visual estimation, is 60 to 65%. The left ventricle has normal function. There is moderately increased left ventricular hypertrophy.  2. Definity contrast agent was given IV to delineate the left ventricular endocardial borders.  3. Left ventricular diastolic parameters are indeterminate.  4. The left ventricle has no regional wall motion abnormalities.  5. Global right ventricle has normal systolic function.The right ventricular size is normal. Right vetricular wall thickness was not assessed.  6. Left atrial size was severely dilated.  7. Right atrial size was normal.  8. The mitral valve is grossly normal. No evidence of mitral valve regurgitation.  9. The tricuspid valve is grossly normal. 10. The tricuspid valve is grossly normal. Tricuspid valve regurgitation is mild. 11. The aortic valve is tricuspid. Aortic valve regurgitation is not visualized. No evidence of aortic valve sclerosis or stenosis. 12. The pulmonic valve was grossly normal. Pulmonic valve regurgitation is not visualized. 13. Mildly elevated pulmonary artery systolic pressure. 14. The interatrial septum was not well visualized. FINDINGS  Left Ventricle: Left ventricular ejection fraction, by visual estimation, is 60 to 65%. The left ventricle has normal function. Definity contrast  agent was given IV to delineate the left ventricular endocardial borders. The left ventricle has no regional wall motion abnormalities. The left ventricular internal cavity size was the left ventricle is normal in size. There is moderately increased left ventricular hypertrophy. Concentric left ventricular hypertrophy. Left ventricular diastolic parameters are indeterminate. Right Ventricle: The right ventricular size is normal. Right vetricular wall thickness was not assessed. Global RV systolic function is has normal systolic function. The tricuspid regurgitant velocity is 2.65 m/s, and with an assumed right atrial pressure of 3 mmHg, the estimated right ventricular systolic pressure is mildly elevated at 31.1 mmHg. Left Atrium: Left atrial size was severely dilated. Right Atrium: Right atrial size was normal in size Pericardium: There is no evidence of pericardial effusion. Mitral Valve: The mitral valve is grossly normal. No evidence of mitral valve regurgitation. Tricuspid Valve: The tricuspid valve is grossly normal. Tricuspid valve regurgitation is mild. Aortic Valve: The aortic valve is tricuspid. Aortic valve regurgitation is not visualized. The aortic valve is structurally normal, with no evidence of sclerosis or stenosis. Mild aortic valve annular calcification. Pulmonic Valve: The pulmonic valve was grossly normal. Pulmonic valve regurgitation is not visualized. Pulmonic regurgitation is not visualized. Aorta: The aortic root is normal in size and structure. Venous: The inferior vena cava was not well visualized. IAS/Shunts: The interatrial septum was not well visualized.  LEFT VENTRICLE PLAX 2D LVIDd:         4.80 cm       Diastology LVIDs:         3.30 cm       LV e' lateral: 14.40 cm/s LV PW:         1.30 cm LV IVS:        1.40 cm LVOT diam:     2.00 cm LV SV:         63 ml LV SV Index:  24.21 LVOT Area:     3.14 cm  LV Volumes (MOD) LV area d, A2C:    25.20 cm LV area d, A4C:    28.70 cm LV area  s, A2C:    11.10 cm LV area s, A4C:    17.40 cm LV major d, A2C:   7.62 cm LV major d, A4C:   7.89 cm LV major s, A2C:   5.17 cm LV major s, A4C:   6.14 cm LV vol d, MOD A2C: 66.5 ml LV vol d, MOD A4C: 88.5 ml LV vol s, MOD A2C: 19.0 ml LV vol s, MOD A4C: 40.1 ml LV SV MOD A2C:     47.5 ml LV SV MOD A4C:     88.5 ml LV SV MOD BP:      47.2 ml RIGHT VENTRICLE RV S prime:     15.40 cm/s TAPSE (M-mode): 2.2 cm LEFT ATRIUM              Index LA diam:        4.40 cm  1.76 cm/m LA Vol (A2C):   135.0 ml 54.14 ml/m LA Vol (A4C):   167.0 ml 66.97 ml/m LA Biplane Vol: 152.0 ml 60.95 ml/m  AORTIC VALVE LVOT Vmax:   87.90 cm/s LVOT Vmean:  57.800 cm/s LVOT VTI:    0.156 m  AORTA Ao Root diam: 3.10 cm Ao Asc diam:  3.30 cm TRICUSPID VALVE TR Peak grad:   28.1 mmHg TR Vmax:        265.00 cm/s  SHUNTS Systemic VTI:  0.16 m Systemic Diam: 2.00 cm  Kate Sable MD Electronically signed by Kate Sable MD Signature Date/Time: 02/13/2019/3:18:50 PM    Final         Scheduled Meds: . Derrill Memo ON 02/16/2019] digoxin  0.125 mg Intravenous Daily  . guaiFENesin  15 mL Per Tube Q6H  . metoprolol tartrate  10 mg Intravenous Q4H  . pantoprazole (PROTONIX) IV  40 mg Intravenous Q12H   Continuous Infusions: . amiodarone 30 mg/hr (02/15/19 0609)  . diltiazem (CARDIZEM) infusion 15 mg/hr (02/15/19 0402)  . potassium chloride 10 mEq (02/15/19 1144)     LOS: 4 days    Time spent: 58min   Domenic Polite, MD [facility]   02/15/2019, 11:56 AM

## 2019-02-15 NOTE — Progress Notes (Deleted)
ANTICOAGULATION CONSULT NOTE - Follow Up Consult  Pharmacy Consult for Heparin Indication: atrial fibrillation  Allergies  Allergen Reactions  . Indomethacin Anaphylaxis    But tolerates ibuprofen, aleve  . Iodine-131 Anaphylaxis    Patient Measurements: Height: 6\' 1"  (185.4 cm) Weight: 283 lb (128.4 kg) IBW/kg (Calculated) : 79.9 Heparin Dosing Weight: 108.4 kg  Vital Signs: Temp: 97.9 F (36.6 C) (01/26 0823) Temp Source: Oral (01/26 0823) BP: 122/67 (01/26 0823) Pulse Rate: 136 (01/26 0823)  Labs: Recent Labs    02/13/19 0322 02/13/19 0322 02/13/19 1108 02/14/19 0730 02/14/19 0744 02/14/19 1502 02/15/19 0504  HGB 12.2*   < > 10.7* 12.3*  --   --  13.0  HCT 37.4*   < > 33.9* 38.2*  --   --  38.9*  PLT 139*   < > 131* 143*  --   --  158  HEPARINUNFRC 0.58   < >  --  0.89*  --  0.65 0.37  CREATININE 1.54*  --   --   --  1.39*  --  1.54*   < > = values in this interval not displayed.    Estimated Creatinine Clearance: 51.9 mL/min (A) (by C-G formula based on SCr of 1.54 mg/dL (H)).   Assessment: Anticoag:Heparin for afib with RVR (new diagnosis). No Ac PTA. Hgb 13 stable. Plt 158 stable.  - 1/23 PM: RN said NG has brown/maroonish output - no fresh blood, but borderline old blood. - 1/25: RN reports copious dark red and thin bloody NG output (1325cc out 1/24) - HL 0.37 in goal, 650 NG output noted yesterday as brown/red foul-smelling - Per cards cont IV hep for now (SBO)  - CHAd2 Vasc score 4   Goal of Therapy:  Heparin level 0.3-0.7 units/ml Monitor platelets by anticoagulation protocol: Yes   Plan:  Cont IV heparin at 1100 units/hr. - Will have to monitor Hgb and copious bloody NG output closely. Daily HL, CBC F/u resuming PTA meds when able    Lind Ausley S. Alford Highland, PharmD, BCPS Clinical Staff Pharmacist [email or web address] Alford Highland, [employer] 02/15/2019,8:43 AM

## 2019-02-15 NOTE — Progress Notes (Addendum)
Subjective: CC: Nausea, bloating, emesis Patient was clamped and given sips of clears yesterday.  He reports that he had a Jell-O and ginger ale yesterday.  Afterwards he felt bloated and nauseated.  He did have 2 small episodes of emesis in the last 24 hours.  Per nursing notes he was rehooked to [facility] and found to have 300cc residual.  He is currently clamped went into the room.  I rehooked him to [employer] with ~50cc residual.  He reports that he continues to pass flatus.  No BM.  He denies any abdominal pain.  He did not get out of bed yesterday.  Objective: Vital signs in last 24 hours: Temp:  [98.4 F (36.9 C)-98.7 F (37.1 C)] 98.4 F (36.9 C) (01/25 1635) Pulse Rate:  [98-145] 98 (01/26 0700) Resp:  [15-21] 21 (01/26 0700) BP: (148-176)/(76-147) 150/90 (01/26 0700) SpO2:  [90 %-97 %] 91 % (01/26 0700) Last BM Date: 02/11/19  Intake/Output from previous day: 01/25 0701 - 01/26 0700 In: 1427.9 [P.O.:720; I.V.:507.9; IV Piggyback:200] Out: 2010 [Urine:1360; Emesis/NG output:650] Intake/Output this shift: No intake/output data recorded.  PE: Gen:  Alert, NAD, pleasant HEENT: NG tube in place.  Reooked to [employer] qwith dark brown output to canister.   Heart: Tachy, irr Pulm: Patient with some rhonchi/rales on the left. Right lung clear. Normal rate and effort  Abd: Soft, distended, NT, hypoactive bowel sounds, there is a noted left-sided ventral hernia that is more difficult to reduce today, but reducible when patient lies flat.  This area is not tender to palpation. Midline wound from prior surgery noted and well healed.   Lab Results:  Recent Labs    02/14/19 0730 02/15/19 0504  WBC 10.2 15.3*  HGB 12.3* 13.0  HCT 38.2* 38.9*  PLT 143* 158   BMET Recent Labs    02/14/19 0744 02/15/19 0504  NA 140 138  K 3.1* 3.3*  CL 101 96*  CO2 29 28  GLUCOSE 111* 130*  BUN 20 20  CREATININE 1.39* 1.54*  CALCIUM 8.2* 8.3*   PT/INR No results for input(s): LABPROT, INR  in the last 72 hours. CMP     Component Value Date/Time   NA 138 02/15/2019 0504   K 3.3 (L) 02/15/2019 0504   CL 96 (L) 02/15/2019 0504   CO2 28 02/15/2019 0504   GLUCOSE 130 (H) 02/15/2019 0504   BUN 20 02/15/2019 0504   CREATININE 1.54 (H) 02/15/2019 0504   CALCIUM 8.3 (L) 02/15/2019 0504   PROT 6.1 (L) 02/12/2019 0544   ALBUMIN 3.0 (L) 02/12/2019 0544   AST 15 02/12/2019 0544   ALT 20 02/12/2019 0544   ALKPHOS 69 02/12/2019 0544   BILITOT 0.8 02/12/2019 0544   GFRNONAA 41 (L) 02/15/2019 0504   GFRAA 48 (L) 02/15/2019 0504   Lipase  No results found for: LIPASE     Studies/Results: DG CHEST PORT 1 VIEW  Result Date: 02/15/2019 CLINICAL DATA:  Cough, COVID positive 02/11/2019, hypertension, coronary artery disease, history MI EXAM: PORTABLE CHEST 1 VIEW COMPARISON:  Portable exam 0740 hours compared to 1901 hours FINDINGS: Nasogastric tube extends into stomach. Borderline enlargement of cardiac silhouette. Mediastinal contours and pulmonary vascularity normal. Atherosclerotic calcification aorta. LEFT upper lobe infiltrate consistent with pneumonia. Minimal RIGHT basilar atelectasis. No pleural effusion or pneumothorax. IMPRESSION: LEFT upper lobe infiltrate consistent with pneumonia. Electronically Signed   By: Lavonia Dana M.D.   On: 02/15/2019 07:56   ECHOCARDIOGRAM COMPLETE  Result Date: 02/13/2019  ECHOCARDIOGRAM REPORT   Patient Name:   Derek Foster [facility] Date of Exam: 02/13/2019 Medical Rec #:  [id number]     Height:       73.0 in Accession #:    [id number]    Weight:       283.0 lb Date of Birth:  January 28, 1935      BSA:          2.49 m Patient Age:    21 years      BP:           123/67 mmHg Patient Gender: M             HR:           100 bpm. Exam Location:  Inpatient Procedure: 2D Echo, Intracardiac Opacification Agent, Color Doppler and Cardiac            Doppler Indications:    Atrial Fibrillation 427.31  History:        Patient has prior history of Echocardiogram examinations,  most                 recent 01/29/2009. Previous Myocardial Infarction. Covid 19                 positive.  Sonographer:    Merrie Roof RDCS Referring Phys: [place]  1. Left ventricular ejection fraction, by visual estimation, is 60 to 65%. The left ventricle has normal function. There is moderately increased left ventricular hypertrophy.  2. Definity contrast agent was given IV to delineate the left ventricular endocardial borders.  3. Left ventricular diastolic parameters are indeterminate.  4. The left ventricle has no regional wall motion abnormalities.  5. Global right ventricle has normal systolic function.The right ventricular size is normal. Right vetricular wall thickness was not assessed.  6. Left atrial size was severely dilated.  7. Right atrial size was normal.  8. The mitral valve is grossly normal. No evidence of mitral valve regurgitation.  9. The tricuspid valve is grossly normal. 10. The tricuspid valve is grossly normal. Tricuspid valve regurgitation is mild. 11. The aortic valve is tricuspid. Aortic valve regurgitation is not visualized. No evidence of aortic valve sclerosis or stenosis. 12. The pulmonic valve was grossly normal. Pulmonic valve regurgitation is not visualized. 13. Mildly elevated pulmonary artery systolic pressure. 14. The interatrial septum was not well visualized. FINDINGS  Left Ventricle: Left ventricular ejection fraction, by visual estimation, is 60 to 65%. The left ventricle has normal function. Definity contrast agent was given IV to delineate the left ventricular endocardial borders. The left ventricle has no regional wall motion abnormalities. The left ventricular internal cavity size was the left ventricle is normal in size. There is moderately increased left ventricular hypertrophy. Concentric left ventricular hypertrophy. Left ventricular diastolic parameters are indeterminate. Right Ventricle: The right ventricular size is normal. Right  vetricular wall thickness was not assessed. Global RV systolic function is has normal systolic function. The tricuspid regurgitant velocity is 2.65 m/s, and with an assumed right atrial pressure of 3 mmHg, the estimated right ventricular systolic pressure is mildly elevated at 31.1 mmHg. Left Atrium: Left atrial size was severely dilated. Right Atrium: Right atrial size was normal in size Pericardium: There is no evidence of pericardial effusion. Mitral Valve: The mitral valve is grossly normal. No evidence of mitral valve regurgitation. Tricuspid Valve: The tricuspid valve is grossly normal. Tricuspid valve regurgitation is mild. Aortic Valve: The aortic valve is tricuspid. Aortic valve regurgitation  is not visualized. The aortic valve is structurally normal, with no evidence of sclerosis or stenosis. Mild aortic valve annular calcification. Pulmonic Valve: The pulmonic valve was grossly normal. Pulmonic valve regurgitation is not visualized. Pulmonic regurgitation is not visualized. Aorta: The aortic root is normal in size and structure. Venous: The inferior vena cava was not well visualized. IAS/Shunts: The interatrial septum was not well visualized.  LEFT VENTRICLE PLAX 2D LVIDd:         4.80 cm       Diastology LVIDs:         3.30 cm       LV e' lateral: 14.40 cm/s LV PW:         1.30 cm LV IVS:        1.40 cm LVOT diam:     2.00 cm LV SV:         63 ml LV SV Index:   24.21 LVOT Area:     3.14 cm  LV Volumes (MOD) LV area d, A2C:    25.20 cm LV area d, A4C:    28.70 cm LV area s, A2C:    11.10 cm LV area s, A4C:    17.40 cm LV major d, A2C:   7.62 cm LV major d, A4C:   7.89 cm LV major s, A2C:   5.17 cm LV major s, A4C:   6.14 cm LV vol d, MOD A2C: 66.5 ml LV vol d, MOD A4C: 88.5 ml LV vol s, MOD A2C: 19.0 ml LV vol s, MOD A4C: 40.1 ml LV SV MOD A2C:     47.5 ml LV SV MOD A4C:     88.5 ml LV SV MOD BP:      47.2 ml RIGHT VENTRICLE RV S prime:     15.40 cm/s TAPSE (M-mode): 2.2 cm LEFT ATRIUM               Index LA diam:        4.40 cm  1.76 cm/m LA Vol (A2C):   135.0 ml 54.14 ml/m LA Vol (A4C):   167.0 ml 66.97 ml/m LA Biplane Vol: 152.0 ml 60.95 ml/m  AORTIC VALVE LVOT Vmax:   87.90 cm/s LVOT Vmean:  57.800 cm/s LVOT VTI:    0.156 m  AORTA Ao Root diam: 3.10 cm Ao Asc diam:  3.30 cm TRICUSPID VALVE TR Peak grad:   28.1 mmHg TR Vmax:        265.00 cm/s  SHUNTS Systemic VTI:  0.16 m Systemic Diam: 2.00 cm  Kate Sable MD Electronically signed by Kate Sable MD Signature Date/Time: 02/13/2019/3:18:50 PM    Final     Anti-infectives: Anti-infectives (From admission, onward)   None       Assessment/Plan COVID POSITIVE 01/19/19 & 02/11/19 Aib/flutter with RVR - Cardizem/Amiodarone drips/Digoxin. Echo 1/24 w/ EF 60-65% Hx MI with PTCA/Stents Hypertension Hyperlipidemia Hypothyroid BMI 37 Recent loss of wife secondary to covid Recent prednisone taper Possible PNA LUL on Xray  SBO Recurrent ventral incisional hernia - Hernia is still reducible  - Contrast is in the sigmoid colon on xray from 1/24 - Failed clamping trial 1/25 - Rehook to LIWS. AM Xray - Keep K<4 and Mg>2 for bowel function - Ice over hernia  - Ambulate as able from our standpoint for bowel function - Hopefully he will improve. Wouldlike to avoid surgery during this admission given recent Covid diagnosis, recent steroid use and new onset A. Fib  FEN: NGT. NPO. Replace K  VTE: SCDs, okay for prophylaxis or heparin from our standpoint  ID: None   LOS: 4 days    Jillyn Ledger , [facility] Surgery 02/15/2019, 8:17 AM Please see Amion for pager number during day hours 7:00am-4:30pm

## 2019-02-15 NOTE — Progress Notes (Signed)
Progress Note  Patient Name: Derek Foster Date of Encounter: 02/15/2019  Primary Cardiologist: Skeet Latch, MD   Subjective   Still had problems with clears. Hopefully slowly improving bowel function.   Inpatient Medications    Scheduled Meds: . digoxin  0.25 mg Intravenous Daily  . guaiFENesin  15 mL Per Tube Q6H  . metoprolol tartrate  10 mg Intravenous Q6H  . pantoprazole (PROTONIX) IV  40 mg Intravenous Q12H   Continuous Infusions: . amiodarone 30 mg/hr (02/15/19 0609)  . diltiazem (CARDIZEM) infusion 15 mg/hr (02/15/19 0402)  . potassium chloride 10 mEq (02/15/19 1030)   PRN Meds: acetaminophen **OR** acetaminophen, guaiFENesin-dextromethorphan, ondansetron (ZOFRAN) IV, phenol   Vital Signs    Vitals:   02/15/19 0609 02/15/19 0700 02/15/19 0823 02/15/19 0900  BP:  (!) 150/90 122/67 (!) 155/103  Pulse: (!) 108 98 (!) 136 (!) 122  Resp: 20 (!) 21 19 (!) 23  Temp:   97.9 F (36.6 C)   TempSrc:   Oral   SpO2: 91% 91% 95% 93%  Weight:      Height:        Intake/Output Summary (Last 24 hours) at 02/15/2019 1037 Last data filed at 02/15/2019 1030 Gross per 24 hour  Intake 1919.61 ml  Output 2335 ml  Net -415.39 ml   Last 3 Weights 02/11/2019 06/16/2012 02/25/2011  Weight (lbs) 283 lb 282 lb 272 lb  Weight (kg) 128.368 kg 127.914 kg 123.378 kg      Telemetry    AFIB 140's- Personally Reviewed  ECG    A. fib- Personally Reviewed  Physical Exam   GEN: Well nourished, well developed, in no acute distress  HEENT: normal  Neck: no JVD, carotid bruits, or masses Cardiac: tachy irreg; no murmurs, rubs, or gallops,no edema  Respiratory:  Coarse B GI: soft, nontender, nondistended, + BS MS: no deformity or atrophy  Skin: warm and dry, no rash Neuro:  Alert and Oriented x 3, Strength and sensation are intact Psych: euthymic mood, full affect   Labs    High Sensitivity Troponin:   Recent Labs  Lab 02/11/19 1836 02/11/19 2045  TROPONINIHS 10 10        Chemistry Recent Labs  Lab 02/11/19 1836 02/11/19 1836 02/12/19 0544 02/12/19 0544 02/13/19 0322 02/14/19 0744 02/15/19 0504  NA 145   < > 143   < > 142 140 138  K 4.7   < > 3.9   < > 3.5 3.1* 3.3*  CL 101   < > 101   < > 102 101 96*  CO2 30   < > 29   < > 30 29 28   GLUCOSE 131*   < > 138*   < > 124* 111* 130*  BUN 43*   < > 36*   < > 27* 20 20  CREATININE 1.71*   < > 1.46*   < > 1.54* 1.39* 1.54*  CALCIUM 8.8*   < > 8.4*   < > 8.2* 8.2* 8.3*  PROT 6.8  --  6.1*  --   --   --   --   ALBUMIN 3.4*  --  3.0*  --   --   --   --   AST 19  --  15  --   --   --   --   ALT 22  --  20  --   --   --   --   ALKPHOS 78  --  69  --   --   --   --  BILITOT 0.7  --  0.8  --   --   --   --   GFRNONAA 36*   < > 44*   < > 41* 47* 41*  GFRAA 42*   < > 51*   < > 48* 54* 48*  ANIONGAP 14   < > 13   < > 10 10 14    < > = values in this interval not displayed.     Hematology Recent Labs  Lab 02/13/19 1108 02/14/19 0730 02/15/19 0504  WBC 7.4 10.2 15.3*  RBC 3.64* 4.18* 4.41  HGB 10.7* 12.3* 13.0  HCT 33.9* 38.2* 38.9*  MCV 93.1 91.4 88.2  MCH 29.4 29.4 29.5  MCHC 31.6 32.2 33.4  RDW 13.3 13.3 13.1  PLT 131* 143* 158    BNPNo results for input(s): BNP, PROBNP in the last 168 hours.   DDimer No results for input(s): DDIMER in the last 168 hours.   Radiology    DG CHEST PORT 1 VIEW  Result Date: 02/15/2019 CLINICAL DATA:  Cough, COVID positive 02/11/2019, hypertension, coronary artery disease, history MI EXAM: PORTABLE CHEST 1 VIEW COMPARISON:  Portable exam 0740 hours compared to 1901 hours FINDINGS: Nasogastric tube extends into stomach. Borderline enlargement of cardiac silhouette. Mediastinal contours and pulmonary vascularity normal. Atherosclerotic calcification aorta. LEFT upper lobe infiltrate consistent with pneumonia. Minimal RIGHT basilar atelectasis. No pleural effusion or pneumothorax. IMPRESSION: LEFT upper lobe infiltrate consistent with pneumonia. Electronically  Signed   By: Lavonia Dana M.D.   On: 02/15/2019 07:56   ECHOCARDIOGRAM COMPLETE  Result Date: 02/13/2019   ECHOCARDIOGRAM REPORT   Patient Name:   Derek Foster [facility] Date of Exam: 02/13/2019 Medical Rec #:  [id number]     Height:       73.0 in Accession #:    [id number]    Weight:       283.0 lb Date of Birth:  1935/04/19      BSA:          2.49 m Patient Age:    6 years      BP:           123/67 mmHg Patient Gender: M             HR:           100 bpm. Exam Location:  Inpatient Procedure: 2D Echo, Intracardiac Opacification Agent, Color Doppler and Cardiac            Doppler Indications:    Atrial Fibrillation 427.31  History:        Patient has prior history of Echocardiogram examinations, most                 recent 01/29/2009. Previous Myocardial Infarction. Covid 19                 positive.  Sonographer:    Merrie Roof RDCS Referring Phys: [place]  1. Left ventricular ejection fraction, by visual estimation, is 60 to 65%. The left ventricle has normal function. There is moderately increased left ventricular hypertrophy.  2. Definity contrast agent was given IV to delineate the left ventricular endocardial borders.  3. Left ventricular diastolic parameters are indeterminate.  4. The left ventricle has no regional wall motion abnormalities.  5. Global right ventricle has normal systolic function.The right ventricular size is normal. Right vetricular wall thickness was not assessed.  6. Left atrial size was severely dilated.  7. Right atrial size was normal.  8. The mitral valve is grossly normal. No evidence of mitral valve regurgitation.  9. The tricuspid valve is grossly normal. 10. The tricuspid valve is grossly normal. Tricuspid valve regurgitation is mild. 11. The aortic valve is tricuspid. Aortic valve regurgitation is not visualized. No evidence of aortic valve sclerosis or stenosis. 12. The pulmonic valve was grossly normal. Pulmonic valve regurgitation is not visualized. 13.  Mildly elevated pulmonary artery systolic pressure. 14. The interatrial septum was not well visualized. FINDINGS  Left Ventricle: Left ventricular ejection fraction, by visual estimation, is 60 to 65%. The left ventricle has normal function. Definity contrast agent was given IV to delineate the left ventricular endocardial borders. The left ventricle has no regional wall motion abnormalities. The left ventricular internal cavity size was the left ventricle is normal in size. There is moderately increased left ventricular hypertrophy. Concentric left ventricular hypertrophy. Left ventricular diastolic parameters are indeterminate. Right Ventricle: The right ventricular size is normal. Right vetricular wall thickness was not assessed. Global RV systolic function is has normal systolic function. The tricuspid regurgitant velocity is 2.65 m/s, and with an assumed right atrial pressure of 3 mmHg, the estimated right ventricular systolic pressure is mildly elevated at 31.1 mmHg. Left Atrium: Left atrial size was severely dilated. Right Atrium: Right atrial size was normal in size Pericardium: There is no evidence of pericardial effusion. Mitral Valve: The mitral valve is grossly normal. No evidence of mitral valve regurgitation. Tricuspid Valve: The tricuspid valve is grossly normal. Tricuspid valve regurgitation is mild. Aortic Valve: The aortic valve is tricuspid. Aortic valve regurgitation is not visualized. The aortic valve is structurally normal, with no evidence of sclerosis or stenosis. Mild aortic valve annular calcification. Pulmonic Valve: The pulmonic valve was grossly normal. Pulmonic valve regurgitation is not visualized. Pulmonic regurgitation is not visualized. Aorta: The aortic root is normal in size and structure. Venous: The inferior vena cava was not well visualized. IAS/Shunts: The interatrial septum was not well visualized.  LEFT VENTRICLE PLAX 2D LVIDd:         4.80 cm       Diastology LVIDs:          3.30 cm       LV e' lateral: 14.40 cm/s LV PW:         1.30 cm LV IVS:        1.40 cm LVOT diam:     2.00 cm LV SV:         63 ml LV SV Index:   24.21 LVOT Area:     3.14 cm  LV Volumes (MOD) LV area d, A2C:    25.20 cm LV area d, A4C:    28.70 cm LV area s, A2C:    11.10 cm LV area s, A4C:    17.40 cm LV major d, A2C:   7.62 cm LV major d, A4C:   7.89 cm LV major s, A2C:   5.17 cm LV major s, A4C:   6.14 cm LV vol d, MOD A2C: 66.5 ml LV vol d, MOD A4C: 88.5 ml LV vol s, MOD A2C: 19.0 ml LV vol s, MOD A4C: 40.1 ml LV SV MOD A2C:     47.5 ml LV SV MOD A4C:     88.5 ml LV SV MOD BP:      47.2 ml RIGHT VENTRICLE RV S prime:     15.40 cm/s TAPSE (M-mode): 2.2 cm LEFT ATRIUM  Index LA diam:        4.40 cm  1.76 cm/m LA Vol (A2C):   135.0 ml 54.14 ml/m LA Vol (A4C):   167.0 ml 66.97 ml/m LA Biplane Vol: 152.0 ml 60.95 ml/m  AORTIC VALVE LVOT Vmax:   87.90 cm/s LVOT Vmean:  57.800 cm/s LVOT VTI:    0.156 m  AORTA Ao Root diam: 3.10 cm Ao Asc diam:  3.30 cm TRICUSPID VALVE TR Peak grad:   28.1 mmHg TR Vmax:        265.00 cm/s  SHUNTS Systemic VTI:  0.16 m Systemic Diam: 2.00 cm  Kate Sable MD Electronically signed by Kate Sable MD Signature Date/Time: 02/13/2019/3:18:50 PM    Final     Cardiac Studies   Echo EF 65%.  Patient Profile     84 y.o. male new onset atrial fibrillation persistent with SBO recurrent ventral incisional hernia.  Assessment & Plan    A. fib with RVR persistent -Continue with IV amiodarone, IV digoxin 0.25 daily, IV metoprolol 10 every 6. Will increase to Q4.  -Digoxin level therapeutic at 0.9 on 1/25. Will decrease to 0.125mg .     Acute kidney injury -Creatinine seems to be improving steadily.  Lab work reviewed.  Small bowel obstruction -Hopefully slowly improving. Surgery note reviewed. NG suction again.      For questions or updates, please contact [place] Please consult [email or web address] for contact info under        Signed, Candee Furbish, MD  02/15/2019, 10:37 AM

## 2019-02-15 NOTE — Progress Notes (Signed)
Notified on-call MD of 300cc of brown, foul-smelling residual which has turned red this morning.

## 2019-02-15 NOTE — Progress Notes (Signed)
Patient stated he vomited in his mouth earlier tonight. Upon checking for residual from NGT, return of 300 mls of brown, foul-smelling liquid noted. Robitussin DM given. States "this cough is driving me crazy."

## 2019-02-16 ENCOUNTER — Inpatient Hospital Stay (HOSPITAL_COMMUNITY): Payer: PPO

## 2019-02-16 DIAGNOSIS — U071 COVID-19: Secondary | ICD-10-CM

## 2019-02-16 LAB — CBC
HCT: 34.4 % — ABNORMAL LOW (ref 39.0–52.0)
Hemoglobin: 11.6 g/dL — ABNORMAL LOW (ref 13.0–17.0)
MCH: 30 pg (ref 26.0–34.0)
MCHC: 33.7 g/dL (ref 30.0–36.0)
MCV: 88.9 fL (ref 80.0–100.0)
Platelets: 128 10*3/uL — ABNORMAL LOW (ref 150–400)
RBC: 3.87 MIL/uL — ABNORMAL LOW (ref 4.22–5.81)
RDW: 13.6 % (ref 11.5–15.5)
WBC: 12.4 10*3/uL — ABNORMAL HIGH (ref 4.0–10.5)
nRBC: 0 % (ref 0.0–0.2)

## 2019-02-16 LAB — BASIC METABOLIC PANEL
Anion gap: 10 (ref 5–15)
BUN: 21 mg/dL (ref 8–23)
CO2: 27 mmol/L (ref 22–32)
Calcium: 8 mg/dL — ABNORMAL LOW (ref 8.9–10.3)
Chloride: 97 mmol/L — ABNORMAL LOW (ref 98–111)
Creatinine, Ser: 1.49 mg/dL — ABNORMAL HIGH (ref 0.61–1.24)
GFR calc Af Amer: 50 mL/min — ABNORMAL LOW (ref >60)
GFR calc non Af Amer: 43 mL/min — ABNORMAL LOW (ref >60)
Glucose, Bld: 104 mg/dL — ABNORMAL HIGH (ref 70–99)
Potassium: 3.2 mmol/L — ABNORMAL LOW (ref 3.5–5.1)
Sodium: 134 mmol/L — ABNORMAL LOW (ref 135–145)

## 2019-02-16 LAB — HEPARIN LEVEL (UNFRACTIONATED): Heparin Unfractionated: 0.1 IU/mL — ABNORMAL LOW (ref 0.30–0.70)

## 2019-02-16 MED ORDER — HEPARIN (PORCINE) 25000 UT/250ML-% IV SOLN
1250.0000 [IU]/h | INTRAVENOUS | Status: DC
  Administered 2019-02-16: 1100 [IU]/h via INTRAVENOUS
  Administered 2019-02-18 – 2019-02-19 (×3): 1250 [IU]/h via INTRAVENOUS
  Filled 2019-02-16 (×5): qty 250

## 2019-02-16 MED ORDER — POTASSIUM CHLORIDE 10 MEQ/100ML IV SOLN
10.0000 meq | INTRAVENOUS | Status: AC
  Administered 2019-02-16 (×4): 10 meq via INTRAVENOUS
  Filled 2019-02-16 (×4): qty 100

## 2019-02-16 NOTE — Progress Notes (Signed)
ANTICOAGULATION CONSULT NOTE - Follow Up Consult  Pharmacy Consult for Heparin Indication: atrial fibrillation  Allergies  Allergen Reactions  . Indomethacin Anaphylaxis    But tolerates ibuprofen, aleve  . Iodine-131 Anaphylaxis    Patient Measurements: Height: 6\' 1"  (185.4 cm) Weight: 283 lb (128.4 kg) IBW/kg (Calculated) : 79.9 Heparin Dosing Weight: 108.4 kg  Vital Signs: Temp: 98.8 F (37.1 C) (01/27 1238) Temp Source: Axillary (01/27 1238) BP: 125/64 (01/27 1238) Pulse Rate: 112 (01/27 1238)  Labs: Recent Labs    02/14/19 0730 02/14/19 0730 02/14/19 0744 02/14/19 1502 02/15/19 0504 02/16/19 0257  HGB 12.3*   < >  --   --  13.0 11.6*  HCT 38.2*  --   --   --  38.9* 34.4*  PLT 143*  --   --   --  158 128*  HEPARINUNFRC 0.89*  --   --  0.65 0.37  --   CREATININE  --   --  1.39*  --  1.54* 1.49*   < > = values in this interval not displayed.    Estimated Creatinine Clearance: 53.7 mL/min (A) (by C-G formula based on SCr of 1.49 mg/dL (H)).   Assessment: Anticoag:Heparin for afib with RVR (new diagnosis) while has bowel obstruction. No Ac PTA. Hgb 13 stable. Plt 158 stable. IV heparin stopped 1/26 for copious brown/ bloody NG output. Hgb now 11.6 down. Plts only 128. Resume heparin 1/27 at previous rate. NG output still 1223ml/24h. - CHAd2 Vasc score 4   Goal of Therapy:  Heparin level 0.3-0.7 units/ml Monitor platelets by anticoagulation protocol: Yes   Plan:  Resume IV heparin at 1100 units (no bolus) - Will have to monitor for Hgb and any other copious bloody NG output closely. Daily HL, CBC F/u resuming PTA PO meds when able     Alika Eppes S. Alford Highland, PharmD, BCPS Clinical Staff Pharmacist [email or web address] Alford Highland, [employer] 02/16/2019,12:39 PM

## 2019-02-16 NOTE — Progress Notes (Signed)
NGT residual after clamping of 4 hours 10cc brown content. Patient with no c/o nausea of vomiting.

## 2019-02-16 NOTE — Progress Notes (Signed)
ANTICOAGULATION CONSULT NOTE - Follow Up Consult  Pharmacy Consult for Heparin Indication: atrial fibrillation  Allergies  Allergen Reactions  . Indomethacin Anaphylaxis    But tolerates ibuprofen, aleve  . Iodine-131 Anaphylaxis    Patient Measurements: Height: 6\' 1"  (185.4 cm) Weight: 283 lb (128.4 kg) IBW/kg (Calculated) : 79.9 Heparin Dosing Weight: 108.4 kg  Vital Signs: Temp: 98.3 F (36.8 C) (01/27 2044) Temp Source: Oral (01/27 2044) BP: 149/131 (01/27 2044) Pulse Rate: 110 (01/27 2044)  Labs: Recent Labs    02/14/19 0730 02/14/19 0730 02/14/19 0744 02/14/19 1502 02/15/19 0504 02/16/19 0257 02/16/19 1934  HGB 12.3*   < >  --   --  13.0 11.6*  --   HCT 38.2*  --   --   --  38.9* 34.4*  --   PLT 143*  --   --   --  158 128*  --   HEPARINUNFRC 0.89*   < >  --  0.65 0.37  --  0.10*  CREATININE  --   --  1.39*  --  1.54* 1.49*  --    < > = values in this interval not displayed.    Estimated Creatinine Clearance: 53.7 mL/min (A) (by C-G formula based on SCr of 1.49 mg/dL (H)).   Assessment: Anticoag:Heparin for afib with RVR (new diagnosis) while has bowel obstruction. No Ac PTA. Hgb 13 stable. Plt 158 stable. IV heparin stopped 1/26 for copious brown/ bloody NG output. Hgb now 11.6 down. Plts only 128. Resume heparin 1/27 at previous rate. - CHAd2 Vasc score 4   Heparin level low tonight on previously therapeutic rate of heparin. Given recent bleeding will only make slight rate increase. No bleeding issues noted by nursing.   Goal of Therapy:  Heparin level 0.3-0.7 units/ml Monitor platelets by anticoagulation protocol: Yes   Plan:  Increase IV heparin to 1250 units (no bolus) - Will have to monitor for Hgb and any other copious bloody NG output closely. Daily HL, CBC F/u resuming PTA PO meds when able    Erin Hearing PharmD., BCPS Clinical Pharmacist 02/16/2019 9:22 PM

## 2019-02-16 NOTE — Progress Notes (Signed)
   Hyperthyroidism  - TSH low (0.026) and Free T4 (1.76) is elevated on admit.  - With challenging to control AFIB, would patient benefit from methimazole or other therapy-question for primary team?  - Dr. Nevada Crane outpatient notes have thyrotoxicosis in diagnosis.   Candee Furbish, MD

## 2019-02-16 NOTE — Progress Notes (Signed)
Updated family member Saahil Kurek patient's wife. Explained patient is able to expectorate well, with no nausea or vomiting. Cough medicine given for productive cough.

## 2019-02-16 NOTE — Progress Notes (Signed)
Patient ID: Derek Foster, male   DOB: 11-Oct-1935, 84 y.o.   MRN: [id number]  PROGRESS NOTE    Derek Foster  [id number] DOB: 1935/11/30 DOA: 02/11/2019 PCP: Celene Squibb, MD   Brief Narrative:  84 year old male with history of CAD status post stent in 2011, hypertension, hyperlipidemia, GERD, hypothyroidism presented with abdominal pain, nausea, vomiting with generalized weakness since his recent Covid diagnosis on 01-29-20 (patient's wife passed away from COVID-53 2 weeks ago). In the ED, he was noted to be in A. fib with RVR with rates in 140s to 160s and small bowel obstruction.  Cardiology and general surgery were consulted.  Treated with amiodarone drip/Cardizem drip/IV metoprolol and digoxin.  SBO was treated with NG tube and bowel rest.  Assessment & Plan:   New onset paroxysmal atrial fibrillation/flutter with rapid ventricular response -Still tachycardic.  Cardiology following.  Currently on amiodarone and Cardizem drips.  Also on IV metoprolol and digoxin. -2D echo showed preserved EF -TSH/T4 consistent with hyperthyroidism.  Will need to start methimazole low-dose once able to tolerate orally  Small bowel obstruction Recurrent ventral incisional hernia -Likely secondary to adhesions from prior surgeries and ventral hernia -General surgery following.  Currently has NG tube.  Replace potassium. -Diet advancement as per general surgery  Acute kidney injury -Likely secondary to GI losses.  Slightly improving.  Thrombocytopenia -Questionable cause.  Monitor.  No signs of bleeding  Recent COVID-19 infection --Patient reports being diagnosed on 01/29/23 at his PCP Dr. Josue Hector office completed 3-week isolation period on 1/21, repeat Covid PCR was positive on 1/22, suspect this is residual viral fragments, I called Dr. Josue Hector office and was able to confirm original positive Covid diagnosis on Jan 29, 2023 -Did have generalized weakness secondary to this without respiratory  symptoms  History of CAD -Remote history of stents -Stable  Hypertension -Currently on Cardizem drip and IV metoprolol.  BP stable  GERD -IV PPI  Generalized deconditioning -Prognosis is guarded.  Will need PT eval once patient is more stable   DVT prophylaxis: SCDs Code Status: DNR Family Communication: None at bedside Disposition Plan: We will remain inpatient for now.  Still tachycardic on Cardizem and amiodarone drips and SBO has not resolved yet.  Consultants: Cardiology/general surgery  Procedures: None  Antimicrobials:  None  Subjective: Patient seen and examined at bedside.  He is a poor historian.  Feels slightly better.  Denies worsening abdominal pain.  Passing some gas.  Objective: Vitals:   02/16/19 0000 02/16/19 0400 02/16/19 0500 02/16/19 0830  BP: 139/66 (!) 147/57  122/81  Pulse: (!) 109 (!) 105 (!) 105 (!) 122  Resp: 18 18 18 15   Temp:  98 F (36.7 C)  99 F (37.2 C)  TempSrc:  Axillary  Axillary  SpO2: 90% 91% 92% 99%  Weight:      Height:        Intake/Output Summary (Last 24 hours) at 02/16/2019 1022 Last data filed at 02/16/2019 0500 Gross per 24 hour  Intake 535.96 ml  Output 2200 ml  Net -1664.04 ml   Filed Weights   02/11/19 1832  Weight: 128.4 kg    Examination:  General exam: Appears calm and comfortable.  Poor historian.  Looks chronically ill. ENT: NG tube present. Respiratory system: Bilateral decreased breath sounds at bases with some scattered crackles Cardiovascular system: S1 & S2 heard; tachycardic  gastrointestinal system: Abdomen is distended, soft and mildly tender.  Bowel sounds sluggish.  Left-sided ventral hernia present  extremities:  No cyanosis, clubbing; trace lower extremity edema Central nervous system: Alert and oriented. No focal neurological deficits. Moving extremities Skin: No rashes, lesions or ulcers Psychiatry: Flat affect    Data Reviewed: I have personally reviewed following labs and imaging  studies  CBC: Recent Labs  Lab 02/11/19 1836 02/12/19 0544 02/13/19 0322 02/13/19 1108 02/14/19 0730 02/15/19 0504 02/16/19 0257  WBC 12.9*   < > 7.8 7.4 10.2 15.3* 12.4*  NEUTROABS 10.1*  --   --   --   --   --   --   HGB 13.8   < > 12.2* 10.7* 12.3* 13.0 11.6*  HCT 42.5   < > 37.4* 33.9* 38.2* 38.9* 34.4*  MCV 92.6   < > 91.0 93.1 91.4 88.2 88.9  PLT 192   < > 139* 131* 143* 158 128*   < > = values in this interval not displayed.   Basic Metabolic Panel: Recent Labs  Lab 02/12/19 0544 02/13/19 0322 02/13/19 1108 02/14/19 0744 02/15/19 0504 02/16/19 0257  NA 143 142  --  140 138 134*  K 3.9 3.5  --  3.1* 3.3* 3.2*  CL 101 102  --  101 96* 97*  CO2 29 30  --  29 28 27   GLUCOSE 138* 124*  --  111* 130* 104*  BUN 36* 27*  --  20 20 21   CREATININE 1.46* 1.54*  --  1.39* 1.54* 1.49*  CALCIUM 8.4* 8.2*  --  8.2* 8.3* 8.0*  MG  --   --  1.9  --   --   --    GFR: Estimated Creatinine Clearance: 53.7 mL/min (A) (by C-G formula based on SCr of 1.49 mg/dL (H)). Liver Function Tests: Recent Labs  Lab 02/11/19 1836 02/12/19 0544  AST 19 15  ALT 22 20  ALKPHOS 78 69  BILITOT 0.7 0.8  PROT 6.8 6.1*  ALBUMIN 3.4* 3.0*   No results for input(s): LIPASE, AMYLASE in the last 168 hours. No results for input(s): AMMONIA in the last 168 hours. Coagulation Profile: No results for input(s): INR, PROTIME in the last 168 hours. Cardiac Enzymes: No results for input(s): CKTOTAL, CKMB, CKMBINDEX, TROPONINI in the last 168 hours. BNP (last 3 results) No results for input(s): PROBNP in the last 8760 hours. HbA1C: No results for input(s): HGBA1C in the last 72 hours. CBG: No results for input(s): GLUCAP in the last 168 hours. Lipid Profile: No results for input(s): CHOL, HDL, LDLCALC, TRIG, CHOLHDL, LDLDIRECT in the last 72 hours. Thyroid Function Tests: No results for input(s): TSH, T4TOTAL, FREET4, T3FREE, THYROIDAB in the last 72 hours. Anemia Panel: No results for  input(s): VITAMINB12, FOLATE, FERRITIN, TIBC, IRON, RETICCTPCT in the last 72 hours. Sepsis Labs: No results for input(s): PROCALCITON, LATICACIDVEN in the last 168 hours.  Recent Results (from the past 240 hour(s))  Respiratory Panel by RT PCR (Flu A&B, Covid) - Nasopharyngeal Swab     Status: Abnormal   Collection Time: 02/11/19  7:10 PM   Specimen: Nasopharyngeal Swab  Result Value Ref Range Status   SARS Coronavirus 2 by RT PCR POSITIVE (A) NEGATIVE Final    Comment: CRITICAL RESULT CALLED TO, READ BACK BY AND VERIFIED WITH: ANDREWS,L. AT 2018 ON 02/11/2019 BY EVA (NOTE) SARS-CoV-2 target nucleic acids are DETECTED. SARS-CoV-2 RNA is generally detectable in upper respiratory specimens  during the acute phase of infection. Positive results are indicative of the presence of the identified virus, but do not rule out bacterial infection or  co-infection with other pathogens not detected by the test. Clinical correlation with patient history and other diagnostic information is necessary to determine patient infection status. The expected result is Negative. Fact Sheet for Patients:  PinkCheek.be Fact Sheet for Healthcare Providers: GravelBags.it This test is not yet approved or cleared by the [place] FDA and  has been authorized for detection and/or diagnosis of SARS-CoV-2 by FDA under an Emergency Use Authorization (EUA).  This EUA will remain in effect (meaning this test c an be used) for the duration of  the COVID-19 declaration under Section 564(b)(1) of the Act, 21 U.S.C. section 360bbb-3(b)(1), unless the authorization is terminated or revoked sooner.    Influenza A by PCR NEGATIVE NEGATIVE Final   Influenza B by PCR NEGATIVE NEGATIVE Final    Comment: (NOTE) The Xpert Xpress SARS-CoV-2/FLU/RSV assay is intended as an aid in  the diagnosis of influenza from Nasopharyngeal swab specimens and  should not be used as  a sole basis for treatment. Nasal washings and  aspirates are unacceptable for Xpert Xpress SARS-CoV-2/FLU/RSV  testing. Fact Sheet for Patients: PinkCheek.be Fact Sheet for Healthcare Providers: GravelBags.it This test is not yet approved or cleared by the [place] FDA and  has been authorized for detection and/or diagnosis of SARS-CoV-2 by  FDA under an Emergency Use Authorization (EUA). This EUA will remain  in effect (meaning this test can be used) for the duration of the  Covid-19 declaration under Section 564(b)(1) of the Act, 21  U.S.C. section 360bbb-3(b)(1), unless the authorization is  terminated or revoked. Performed at [facility], [address], [place], [place] [address]          Radiology Studies: DG CHEST PORT 1 VIEW  Result Date: 02/15/2019 CLINICAL DATA:  Cough, COVID positive 02/11/2019, hypertension, coronary artery disease, history MI EXAM: PORTABLE CHEST 1 VIEW COMPARISON:  Portable exam 0740 hours compared to 1901 hours FINDINGS: Nasogastric tube extends into stomach. Borderline enlargement of cardiac silhouette. Mediastinal contours and pulmonary vascularity normal. Atherosclerotic calcification aorta. LEFT upper lobe infiltrate consistent with pneumonia. Minimal RIGHT basilar atelectasis. No pleural effusion or pneumothorax. IMPRESSION: LEFT upper lobe infiltrate consistent with pneumonia. Electronically Signed   By: Lavonia Dana M.D.   On: 02/15/2019 07:56   DG Abd Portable 1V  Result Date: 02/16/2019 CLINICAL DATA:  Small-bowel obstruction EXAM: PORTABLE ABDOMEN - 1 VIEW COMPARISON:  02/15/2019 FINDINGS: NG tube pulled back with the side hole now at the GE junction. Tip in the stomach. Mild small bowel dilatation unchanged. Colon decompressed. Contrast in the colon and rectum. IMPRESSION: Mild small bowel dilatation unchanged. NG pulled back with the side hole at the GE junction. Electronically  Signed   By: Franchot Gallo M.D.   On: 02/16/2019 08:35   DG Abd Portable 1V  Result Date: 02/15/2019 CLINICAL DATA:  NG tube placement.  COVID positive. EXAM: PORTABLE ABDOMEN - 1 VIEW COMPARISON:  02/13/2019.  CT 02/12/2019. FINDINGS: NG tube noted with tip and side hole below left hemidiaphragm position of the stomach. Surgical clips right upper quadrant. Dilated loops of small bowel noted. Oral contrast in the colon. Similar findings noted on prior exam. No free air. IMPRESSION: 1.  NG tube noted with tip and side hole in the stomach. 2. Persistent dilated loops of small bowel. Contrast in the colon. Similar findings noted on prior exam. Electronically Signed   By: [place]   On: 02/15/2019 12:56        Scheduled Meds: . digoxin  0.125 mg Intravenous Daily  . guaiFENesin  15 mL Per Tube Q6H  . metoprolol tartrate  10 mg Intravenous Q4H  . pantoprazole (PROTONIX) IV  40 mg Intravenous Q12H   Continuous Infusions: . sodium chloride 50 mL/hr at 02/16/19 0601  . amiodarone 30 mg/hr (02/16/19 0601)  . diltiazem (CARDIZEM) infusion 15 mg/hr (02/16/19 0600)  . potassium chloride 10 mEq (02/16/19 0900)          Aline August, MD [facility] 02/16/2019, 10:22 AM

## 2019-02-16 NOTE — Plan of Care (Signed)
  Problem: Education: Goal: Knowledge of General Education information will improve Description: Including pain rating scale, medication(s)/side effects and non-pharmacologic comfort measures Outcome: Progressing   Problem: Activity: Goal: Risk for activity intolerance will decrease Outcome: Progressing   Problem: Education: Goal: Knowledge of risk factors and measures for prevention of condition will improve Outcome: Progressing

## 2019-02-16 NOTE — Progress Notes (Signed)
.  mews: patient in Afib w/RVR currently max rate on diltiazem, amiodarone HR range 112-132 MD aware.

## 2019-02-16 NOTE — Progress Notes (Signed)
Progress Note  Patient Name: Derek Foster Date of Encounter: 02/16/2019  Primary Cardiologist: Skeet Latch, MD   Subjective   Frustrated by his bowel obstruction.  No significant chest pain or shortness of breath.  NG tube in place.  Inpatient Medications    Scheduled Meds: . digoxin  0.125 mg Intravenous Daily  . guaiFENesin  15 mL Per Tube Q6H  . metoprolol tartrate  10 mg Intravenous Q4H  . pantoprazole (PROTONIX) IV  40 mg Intravenous Q12H   Continuous Infusions: . sodium chloride 50 mL/hr at 02/16/19 0601  . amiodarone 30 mg/hr (02/16/19 0601)  . diltiazem (CARDIZEM) infusion 15 mg/hr (02/16/19 0600)  . potassium chloride 10 mEq (02/16/19 0900)   PRN Meds: acetaminophen **OR** acetaminophen, guaiFENesin-dextromethorphan, menthol-cetylpyridinium, ondansetron (ZOFRAN) IV, phenol   Vital Signs    Vitals:   02/16/19 0000 02/16/19 0400 02/16/19 0500 02/16/19 0830  BP: 139/66 (!) 147/57  122/81  Pulse: (!) 109 (!) 105 (!) 105 (!) 122  Resp: 18 18 18 15   Temp:  98 F (36.7 C)  99 F (37.2 C)  TempSrc:  Axillary  Axillary  SpO2: 90% 91% 92% 99%  Weight:      Height:        Intake/Output Summary (Last 24 hours) at 02/16/2019 1044 Last data filed at 02/16/2019 0500 Gross per 24 hour  Intake 535.96 ml  Output 1825 ml  Net -1289.04 ml   Last 3 Weights 02/11/2019 06/16/2012 02/25/2011  Weight (lbs) 283 lb 282 lb 272 lb  Weight (kg) 128.368 kg 127.914 kg 123.378 kg      Telemetry    Atrial fibrillation with rates mostly in the 100's to 120's but as high as the 140's at times.  - Personally Reviewed  ECG    No new ECG tracing. - Personally Reviewed  Physical Exam   GEN: No acute distress.   Neck: No JVD Cardiac:  Tachycardic, no murmurs, rubs, or gallops.  Respiratory: Clear to auscultation bilaterally. GI: Soft, nontender, non-distended, NG tube MS: No edema; No deformity. Neuro:  Nonfocal  Psych: Normal affect   Labs    High Sensitivity  Troponin:   Recent Labs  Lab 02/11/19 1836 02/11/19 2045  TROPONINIHS 10 10      Chemistry Recent Labs  Lab 02/11/19 1836 02/11/19 1836 02/12/19 0544 02/13/19 0322 02/14/19 0744 02/15/19 0504 02/16/19 0257  NA 145   < > 143   < > 140 138 134*  K 4.7   < > 3.9   < > 3.1* 3.3* 3.2*  CL 101   < > 101   < > 101 96* 97*  CO2 30   < > 29   < > 29 28 27   GLUCOSE 131*   < > 138*   < > 111* 130* 104*  BUN 43*   < > 36*   < > 20 20 21   CREATININE 1.71*   < > 1.46*   < > 1.39* 1.54* 1.49*  CALCIUM 8.8*   < > 8.4*   < > 8.2* 8.3* 8.0*  PROT 6.8  --  6.1*  --   --   --   --   ALBUMIN 3.4*  --  3.0*  --   --   --   --   AST 19  --  15  --   --   --   --   ALT 22  --  20  --   --   --   --  ALKPHOS 78  --  69  --   --   --   --   BILITOT 0.7  --  0.8  --   --   --   --   GFRNONAA 36*   < > 44*   < > 47* 41* 43*  GFRAA 42*   < > 51*   < > 54* 48* 50*  ANIONGAP 14   < > 13   < > 10 14 10    < > = values in this interval not displayed.     Hematology Recent Labs  Lab 02/14/19 0730 02/15/19 0504 02/16/19 0257  WBC 10.2 15.3* 12.4*  RBC 4.18* 4.41 3.87*  HGB 12.3* 13.0 11.6*  HCT 38.2* 38.9* 34.4*  MCV 91.4 88.2 88.9  MCH 29.4 29.5 30.0  MCHC 32.2 33.4 33.7  RDW 13.3 13.1 13.6  PLT 143* 158 128*    BNPNo results for input(s): BNP, PROBNP in the last 168 hours.   DDimer No results for input(s): DDIMER in the last 168 hours.   Radiology    DG CHEST PORT 1 VIEW  Result Date: 02/15/2019 CLINICAL DATA:  Cough, COVID positive 02/11/2019, hypertension, coronary artery disease, history MI EXAM: PORTABLE CHEST 1 VIEW COMPARISON:  Portable exam 0740 hours compared to 1901 hours FINDINGS: Nasogastric tube extends into stomach. Borderline enlargement of cardiac silhouette. Mediastinal contours and pulmonary vascularity normal. Atherosclerotic calcification aorta. LEFT upper lobe infiltrate consistent with pneumonia. Minimal RIGHT basilar atelectasis. No pleural effusion or  pneumothorax. IMPRESSION: LEFT upper lobe infiltrate consistent with pneumonia. Electronically Signed   By: Lavonia Dana M.D.   On: 02/15/2019 07:56   DG Abd Portable 1V  Result Date: 02/16/2019 CLINICAL DATA:  Small-bowel obstruction EXAM: PORTABLE ABDOMEN - 1 VIEW COMPARISON:  02/15/2019 FINDINGS: NG tube pulled back with the side hole now at the GE junction. Tip in the stomach. Mild small bowel dilatation unchanged. Colon decompressed. Contrast in the colon and rectum. IMPRESSION: Mild small bowel dilatation unchanged. NG pulled back with the side hole at the GE junction. Electronically Signed   By: Franchot Gallo M.D.   On: 02/16/2019 08:35   DG Abd Portable 1V  Result Date: 02/15/2019 CLINICAL DATA:  NG tube placement.  COVID positive. EXAM: PORTABLE ABDOMEN - 1 VIEW COMPARISON:  02/13/2019.  CT 02/12/2019. FINDINGS: NG tube noted with tip and side hole below left hemidiaphragm position of the stomach. Surgical clips right upper quadrant. Dilated loops of small bowel noted. Oral contrast in the colon. Similar findings noted on prior exam. No free air. IMPRESSION: 1.  NG tube noted with tip and side hole in the stomach. 2. Persistent dilated loops of small bowel. Contrast in the colon. Similar findings noted on prior exam. Electronically Signed   By: [place]   On: 02/15/2019 12:56    Cardiac Studies   Echocardiogram 02/13/2019: Impressions:  1. Left ventricular ejection fraction, by visual estimation, is 60 to 65%. The left ventricle has normal function. There is moderately increased left ventricular hypertrophy.  2. Definity contrast agent was given IV to delineate the left ventricular endocardial borders.  3. Left ventricular diastolic parameters are indeterminate.  4. The left ventricle has no regional wall motion abnormalities.  5. Global right ventricle has normal systolic function.The right ventricular size is normal. Right vetricular wall thickness was not assessed.  6. Left  atrial size was severely dilated.  7. Right atrial size was normal.  8. The mitral valve is grossly normal. No  evidence of mitral valve regurgitation.  9. The tricuspid valve is grossly normal. 10. The tricuspid valve is grossly normal. Tricuspid valve regurgitation is mild. 11. The aortic valve is tricuspid. Aortic valve regurgitation is not visualized. No evidence of aortic valve sclerosis or stenosis. 12. The pulmonic valve was grossly normal. Pulmonic valve regurgitation is not visualized. 13. Mildly elevated pulmonary artery systolic pressure. 14. The interatrial septum was not well visualized.  Patient Profile     84 y.o. male with a history of CAD s.p DES to LAD in 01/2009 with medical management recommended for RCA disease, hypertension, hyperlipidemia, hypothyroidism, who is being seen for new onset atrial fibrillation in the setting of COVID-19 infection and recurrent small bowel obstruction   Assessment & Plan    New Onset Atrial Fibrillation - Per telemetry, remains in atrial fibrillation with rates mostly in the 100's to 120's but as high as 140's at times. Rates have been difficult to control.  - Echo shows LVEF of 60-65% with severely dilated left atrium.   - Potassium 3.2. Keep >4.0. Being supplemented.  - Today's Magnesium pending.  - TSH low at 0.026. Free T4 high at 1.76. Free T3 normal. May benefit from methimazole or other therapy due to difficult to control rates. Will defer to primary team.  He tells me that he has been to general surgery to examine his thyroid.  No malignant lesions. - Continue IV Amiodarone, IV Digoxin 0.125mg  daily, and IV Metoprolol 10mg  every 4 hours. - Initially on IV Heparin for anticoagulation but this has since been stopped. Unclear why. CHA2DS2-VASc = 4 (CAD, HTN, age x2). Therefore, would restart if no contraindications. -When able, oral Eliquis should be utilized.  CAD - Stable.  - Resume home statin and beta blocker when able to take PO  medications.  - Aspirin stopped due to need for anticoagulation.   Hypertension - BP mostly well controlled. - Continue IV Metoprolol as above. - Can restart home medication when able to take PO.  AKI - Creatinine stable at 1.49 today.  - Continue to monitor daily.   Small Bowel Obstruction - Hopefully will be able to avoid surgery given COVID-19 and new onset atrial fibrillation. - Surgery following.   COVID-19  - Management per primary team.   For questions or updates, please contact [place] Please consult [email or web address] for contact info under        Signed, Candee Furbish, MD  02/16/2019, 10:44 AM

## 2019-02-16 NOTE — Progress Notes (Signed)
Subjective: CC: Patient reports that he feels his abdomen is improved since yesterday.  He denies any abdominal pain or bloating.  He continues to pass flatus and did so while was in the room.  No BM.  He does report one episode of nausea in the last 24 hours.  None currently.  He had 2 cups of ice yesterday.  He was able to get up in the chair yesterday.  Heart rate continues to be elevated.   Objective: Vital signs in last 24 hours: Temp:  [97.7 F (36.5 C)-99 F (37.2 C)] 99 F (37.2 C) (01/27 0830) Pulse Rate:  [96-135] 122 (01/27 0830) Resp:  [12-19] 15 (01/27 0830) BP: (122-148)/(57-81) 122/81 (01/27 0830) SpO2:  [90 %-99 %] 99 % (01/27 0830) Last BM Date: 02/11/19  Intake/Output from previous day: 01/26 0701 - 01/27 0700 In: 690.1 [I.V.:240.5; IV Piggyback:449.6] Out: 2200 [Urine:1000; Emesis/NG output:1200] Intake/Output this shift: No intake/output data recorded.  PE: Gen: Alert, NAD, pleasant HEENT:NG tube in place on LIWS with light yellow output in cannister Heart: Tachy, irr Pulm: Patient with some rhonchi/rales on the left. Right lung clear.  Slightly tachypneic.  Tries to clear his throat and coughs multiple times while in the room. Abd: Soft, protuberant and less distended.  Patient reports this is baseline abdomen.  Non-tender, more active bowel sounds,there is a noted left-sided ventral hernia that is easily reducible and patient lies flat.  Hernia is nontender midline wound from prior surgery noted and well healed.  Lab Results:  Recent Labs    02/15/19 0504 02/16/19 0257  WBC 15.3* 12.4*  HGB 13.0 11.6*  HCT 38.9* 34.4*  PLT 158 128*   BMET Recent Labs    02/15/19 0504 02/16/19 0257  NA 138 134*  K 3.3* 3.2*  CL 96* 97*  CO2 28 27  GLUCOSE 130* 104*  BUN 20 21  CREATININE 1.54* 1.49*  CALCIUM 8.3* 8.0*   PT/INR No results for input(s): LABPROT, INR in the last 72 hours. CMP     Component Value Date/Time   NA 134 (L)  02/16/2019 0257   K 3.2 (L) 02/16/2019 0257   CL 97 (L) 02/16/2019 0257   CO2 27 02/16/2019 0257   GLUCOSE 104 (H) 02/16/2019 0257   BUN 21 02/16/2019 0257   CREATININE 1.49 (H) 02/16/2019 0257   CALCIUM 8.0 (L) 02/16/2019 0257   PROT 6.1 (L) 02/12/2019 0544   ALBUMIN 3.0 (L) 02/12/2019 0544   AST 15 02/12/2019 0544   ALT 20 02/12/2019 0544   ALKPHOS 69 02/12/2019 0544   BILITOT 0.8 02/12/2019 0544   GFRNONAA 43 (L) 02/16/2019 0257   GFRAA 50 (L) 02/16/2019 0257   Lipase  No results found for: LIPASE     Studies/Results: DG CHEST PORT 1 VIEW  Result Date: 02/15/2019 CLINICAL DATA:  Cough, COVID positive 02/11/2019, hypertension, coronary artery disease, history MI EXAM: PORTABLE CHEST 1 VIEW COMPARISON:  Portable exam 0740 hours compared to 1901 hours FINDINGS: Nasogastric tube extends into stomach. Borderline enlargement of cardiac silhouette. Mediastinal contours and pulmonary vascularity normal. Atherosclerotic calcification aorta. LEFT upper lobe infiltrate consistent with pneumonia. Minimal RIGHT basilar atelectasis. No pleural effusion or pneumothorax. IMPRESSION: LEFT upper lobe infiltrate consistent with pneumonia. Electronically Signed   By: Lavonia Dana M.D.   On: 02/15/2019 07:56   DG Abd Portable 1V  Result Date: 02/16/2019 CLINICAL DATA:  Small-bowel obstruction EXAM: PORTABLE ABDOMEN - 1 VIEW COMPARISON:  02/15/2019 FINDINGS: NG tube  pulled back with the side hole now at the GE junction. Tip in the stomach. Mild small bowel dilatation unchanged. Colon decompressed. Contrast in the colon and rectum. IMPRESSION: Mild small bowel dilatation unchanged. NG pulled back with the side hole at the GE junction. Electronically Signed   By: Franchot Gallo M.D.   On: 02/16/2019 08:35   DG Abd Portable 1V  Result Date: 02/15/2019 CLINICAL DATA:  NG tube placement.  COVID positive. EXAM: PORTABLE ABDOMEN - 1 VIEW COMPARISON:  02/13/2019.  CT 02/12/2019. FINDINGS: NG tube noted with  tip and side hole below left hemidiaphragm position of the stomach. Surgical clips right upper quadrant. Dilated loops of small bowel noted. Oral contrast in the colon. Similar findings noted on prior exam. No free air. IMPRESSION: 1.  NG tube noted with tip and side hole in the stomach. 2. Persistent dilated loops of small bowel. Contrast in the colon. Similar findings noted on prior exam. Electronically Signed   By: [place]   On: 02/15/2019 12:56    Anti-infectives: Anti-infectives (From admission, onward)   None       Assessment/Plan COVID POSITIVE 01/19/19 & 02/11/19 Aib/flutter with RVR -Cardizem/Amiodarone drips/Digoxin. Echo 1/24 w/ EF 60-65% Hx MI with PTCA/Stents Hypertension Hyperlipidemia Hypothyroid -cardiology recommending methimazole BMI 37 Recent loss of wife secondary to covid Recent prednisone taper Possible PNA LUL on Xray Anemia  Hypokalemia   SBO Recurrent ventral incisional hernia - Hernia is reducible  -Contrast is in the sigmoid colonon xray from 1/24 - Failed clamping trial 1/25 - Keep K<4 and Mg>2 for bowel function - Ice over hernia  - Ambulate as able from our standpoint for bowel function - X-ray this a.m. with contrast in the colon and the rectum.  Still some mild small bowel dilatation.  Patient continues to pass flatus, is pain-free, abdomen flat, non-distended, nontender and hernia is reducible.  Will repeat clamping trial and leave n.p.o.  If 4-hour residual is less than 200 mL will remove NG tube. - Hopefully he will improve.Wouldlike to avoid surgery during this admission given recent Covid diagnosis, recent steroid useandnew onset A. Fib  FEN: NGT. NPO. Replace K VTE: SCDs, okay for prophylaxis or heparin from our standpoint  ID: None   LOS: 5 days    Jillyn Ledger , [facility] Surgery 02/16/2019, 9:10 AM Please see Amion for pager number during day hours 7:00am-4:30pm

## 2019-02-17 LAB — CBC WITH DIFFERENTIAL/PLATELET
Abs Immature Granulocytes: 0.43 10*3/uL — ABNORMAL HIGH (ref 0.00–0.07)
Basophils Absolute: 0.1 10*3/uL (ref 0.0–0.1)
Basophils Relative: 1 %
Eosinophils Absolute: 0.2 10*3/uL (ref 0.0–0.5)
Eosinophils Relative: 1 %
HCT: 38.5 % — ABNORMAL LOW (ref 39.0–52.0)
Hemoglobin: 12.9 g/dL — ABNORMAL LOW (ref 13.0–17.0)
Immature Granulocytes: 3 %
Lymphocytes Relative: 9 %
Lymphs Abs: 1.3 10*3/uL (ref 0.7–4.0)
MCH: 29.9 pg (ref 26.0–34.0)
MCHC: 33.5 g/dL (ref 30.0–36.0)
MCV: 89.3 fL (ref 80.0–100.0)
Monocytes Absolute: 1.1 10*3/uL — ABNORMAL HIGH (ref 0.1–1.0)
Monocytes Relative: 7 %
Neutro Abs: 11.9 10*3/uL — ABNORMAL HIGH (ref 1.7–7.7)
Neutrophils Relative %: 79 %
Platelets: 161 10*3/uL (ref 150–400)
RBC: 4.31 MIL/uL (ref 4.22–5.81)
RDW: 13.3 % (ref 11.5–15.5)
WBC: 15 10*3/uL — ABNORMAL HIGH (ref 4.0–10.5)
nRBC: 0 % (ref 0.0–0.2)

## 2019-02-17 LAB — BASIC METABOLIC PANEL
Anion gap: 15 (ref 5–15)
BUN: 17 mg/dL (ref 8–23)
CO2: 25 mmol/L (ref 22–32)
Calcium: 8.1 mg/dL — ABNORMAL LOW (ref 8.9–10.3)
Chloride: 94 mmol/L — ABNORMAL LOW (ref 98–111)
Creatinine, Ser: 1.4 mg/dL — ABNORMAL HIGH (ref 0.61–1.24)
GFR calc Af Amer: 54 mL/min — ABNORMAL LOW (ref >60)
GFR calc non Af Amer: 46 mL/min — ABNORMAL LOW (ref >60)
Glucose, Bld: 98 mg/dL (ref 70–99)
Potassium: 3.2 mmol/L — ABNORMAL LOW (ref 3.5–5.1)
Sodium: 134 mmol/L — ABNORMAL LOW (ref 135–145)

## 2019-02-17 LAB — HEPARIN LEVEL (UNFRACTIONATED): Heparin Unfractionated: 0.38 IU/mL (ref 0.30–0.70)

## 2019-02-17 LAB — MAGNESIUM: Magnesium: 1.7 mg/dL (ref 1.7–2.4)

## 2019-02-17 MED ORDER — BOOST / RESOURCE BREEZE PO LIQD CUSTOM
1.0000 | Freq: Three times a day (TID) | ORAL | Status: DC
  Administered 2019-02-17 – 2019-02-18 (×4): 1 via ORAL

## 2019-02-17 MED ORDER — MAGNESIUM SULFATE 2 GM/50ML IV SOLN
2.0000 g | Freq: Once | INTRAVENOUS | Status: DC
  Administered 2019-02-17: 2 g via INTRAVENOUS
  Filled 2019-02-17: qty 50

## 2019-02-17 MED ORDER — ADULT MULTIVITAMIN W/MINERALS CH
1.0000 | ORAL_TABLET | Freq: Every day | ORAL | Status: DC
  Administered 2019-02-17 – 2019-03-12 (×22): 1 via ORAL
  Filled 2019-02-17 (×23): qty 1

## 2019-02-17 MED ORDER — MAGNESIUM SULFATE 2 GM/50ML IV SOLN
2.0000 g | Freq: Once | INTRAVENOUS | Status: DC
  Filled 2019-02-17: qty 50

## 2019-02-17 MED ORDER — POTASSIUM CHLORIDE 10 MEQ/100ML IV SOLN
10.0000 meq | INTRAVENOUS | Status: AC
  Administered 2019-02-17 (×5): 10 meq via INTRAVENOUS
  Filled 2019-02-17 (×3): qty 100

## 2019-02-17 MED ORDER — POTASSIUM CHLORIDE 10 MEQ/100ML IV SOLN
10.0000 meq | Freq: Once | INTRAVENOUS | Status: AC
  Administered 2019-02-17: 10 meq via INTRAVENOUS
  Filled 2019-02-17: qty 100

## 2019-02-17 NOTE — Progress Notes (Signed)
No residual noted from clamped NG tube. However, significant leaking was discovered this AM at 0630. Tightened the connection and gave report to dayshift RN.

## 2019-02-17 NOTE — Progress Notes (Signed)
Subjective: CC: Cough Patients NGT clamped yesterday afternoon into this morning.  Notes reviewed and a 4-hour residual was checked at roughly 5 PM yesterday with only 10 cc of brown content.  Patient denies any abdominal pain, distention, nausea or vomiting.  He continues to pass flatus.  Still no BM.  He states that he feels much better from the standpoint.  He does complain of a cough with clear sputum and is using wall suction for this. He did not get out of bed yesterday but is up in the chair this morning.   Objective: Vital signs in last 24 hours: Temp:  [98.3 F (36.8 C)-99 F (37.2 C)] 98.3 F (36.8 C) (01/27 2044) Pulse Rate:  [104-131] 106 (01/28 0000) Resp:  [15-23] 19 (01/27 2115) BP: (122-150)/(64-131) 141/78 (01/28 0000) SpO2:  [91 %-99 %] 93 % (01/28 0000) Last BM Date: 02/11/19  Intake/Output from previous day: 01/27 0701 - 01/28 0700 In: 2905.7 [P.O.:60; I.V.:2845.7] Out: 500 [Urine:200; Emesis/NG output:300] Intake/Output this shift: No intake/output data recorded.  PE: Gen: Alert, NAD, pleasant HEENT:NG tube clamped Heart: Tachy, irr Pulm: Tries to clear his throat and coughs multiple times while in the room. Improves after wall suction with clear sputum in cannister. Lungs clear b/l Abd: Soft, ND, NT. Patient reports this is baseline abdomen. Normoactive bowel sounds. There is a left-sided ventral hernia that iseasily reducible while patient is in recliner.  Hernia is non tender. Midline wound from prior surgery noted and well healed.  Lab Results:  Recent Labs    02/16/19 0257 02/17/19 0648  WBC 12.4* 15.0*  HGB 11.6* 12.9*  HCT 34.4* 38.5*  PLT 128* 161   BMET Recent Labs    02/16/19 0257 02/17/19 0648  NA 134* 134*  K 3.2* 3.2*  CL 97* 94*  CO2 27 25  GLUCOSE 104* 98  BUN 21 17  CREATININE 1.49* 1.40*  CALCIUM 8.0* 8.1*   PT/INR No results for input(s): LABPROT, INR in the last 72 hours. CMP     Component Value Date/Time     NA 134 (L) 02/17/2019 0648   K 3.2 (L) 02/17/2019 0648   CL 94 (L) 02/17/2019 0648   CO2 25 02/17/2019 0648   GLUCOSE 98 02/17/2019 0648   BUN 17 02/17/2019 0648   CREATININE 1.40 (H) 02/17/2019 0648   CALCIUM 8.1 (L) 02/17/2019 0648   PROT 6.1 (L) 02/12/2019 0544   ALBUMIN 3.0 (L) 02/12/2019 0544   AST 15 02/12/2019 0544   ALT 20 02/12/2019 0544   ALKPHOS 69 02/12/2019 0544   BILITOT 0.8 02/12/2019 0544   GFRNONAA 46 (L) 02/17/2019 0648   GFRAA 54 (L) 02/17/2019 0648   Lipase  No results found for: LIPASE     Studies/Results: DG Abd Portable 1V  Result Date: 02/16/2019 CLINICAL DATA:  Small-bowel obstruction EXAM: PORTABLE ABDOMEN - 1 VIEW COMPARISON:  02/15/2019 FINDINGS: NG tube pulled back with the side hole now at the GE junction. Tip in the stomach. Mild small bowel dilatation unchanged. Colon decompressed. Contrast in the colon and rectum. IMPRESSION: Mild small bowel dilatation unchanged. NG pulled back with the side hole at the GE junction. Electronically Signed   By: Franchot Gallo M.D.   On: 02/16/2019 08:35   DG Abd Portable 1V  Result Date: 02/15/2019 CLINICAL DATA:  NG tube placement.  COVID positive. EXAM: PORTABLE ABDOMEN - 1 VIEW COMPARISON:  02/13/2019.  CT 02/12/2019. FINDINGS: NG tube noted with tip and side  hole below left hemidiaphragm position of the stomach. Surgical clips right upper quadrant. Dilated loops of small bowel noted. Oral contrast in the colon. Similar findings noted on prior exam. No free air. IMPRESSION: 1.  NG tube noted with tip and side hole in the stomach. 2. Persistent dilated loops of small bowel. Contrast in the colon. Similar findings noted on prior exam. Electronically Signed   By: [place]   On: 02/15/2019 12:56    Anti-infectives: Anti-infectives (From admission, onward)   None       Assessment/Plan COVID POSITIVE 01/19/19 & 02/11/19 Aib/flutter with RVR -Cardizem/Amiodarone drips/Digoxin. Echo 1/24 w/ EF  60-65% Hx MI with PTCA/Stents Hypertension Hyperlipidemia Hypothyroid -cardiology recommending methimazole BMI 37 Recent loss of wife secondary to covid Recent prednisone taper Possible PNA LUL on Xray Anemia   SBO Recurrent ventral incisional hernia - Hernia is reducible. No indication for emergency surgery - D/c NGT. Allow clears - Hopefully hewillimprove.Wouldlike to avoid surgery during this admission given recent Covid diagnosis, recent steroid useandnew onset A. Fib.  - Keep K>4 and Mg > 2 for bowel function - Mobilize for bowel function.   FEN: D/c NGT. CLD VTE: SCDs,heparin gtt ID: None   LOS: 6 days    Jillyn Ledger , [facility] Surgery 02/17/2019, 8:24 AM Please see Amion for pager number during day hours 7:00am-4:30pm

## 2019-02-17 NOTE — Progress Notes (Addendum)
Progress Note  Patient Name: Derek Foster Date of Encounter: 02/17/2019  Primary Cardiologist: Skeet Latch, MD   Subjective   Patient is happy to have his NG tube out.  He has been drinking some liquids this morning and appears to be tolerating it.  He denies any chest discomfort.  He does have shortness of breath when he gets up and moves around.  He denies any palpitations.  Inpatient Medications    Scheduled Meds: . digoxin  0.125 mg Intravenous Daily  . guaiFENesin  15 mL Per Tube Q6H  . metoprolol tartrate  10 mg Intravenous Q4H  . pantoprazole (PROTONIX) IV  40 mg Intravenous Q12H   Continuous Infusions: . sodium chloride 50 mL/hr at 02/17/19 0347  . amiodarone 30 mg/hr (02/17/19 0532)  . diltiazem (CARDIZEM) infusion 15 mg/hr (02/17/19 0531)  . heparin 1,250 Units/hr (02/17/19 0348)  . potassium chloride 10 mEq (02/17/19 0858)   PRN Meds: acetaminophen **OR** acetaminophen, guaiFENesin-dextromethorphan, menthol-cetylpyridinium, ondansetron (ZOFRAN) IV, phenol   Vital Signs    Vitals:   02/16/19 2115 02/16/19 2346 02/16/19 2353 02/17/19 0000  BP:  (!) 150/76  (!) 141/78  Pulse: (!) 107 (!) 131 (!) 104 (!) 106  Resp: 19     Temp:      TempSrc:      SpO2: 91% 93% 93% 93%  Weight:      Height:        Intake/Output Summary (Last 24 hours) at 02/17/2019 0903 Last data filed at 02/17/2019 0348 Gross per 24 hour  Intake 2905.67 ml  Output 400 ml  Net 2505.67 ml   Last 3 Weights 02/11/2019 06/16/2012 02/25/2011  Weight (lbs) 283 lb 282 lb 272 lb  Weight (kg) 128.368 kg 127.914 kg 123.378 kg      Telemetry    Atrial fibrillation with rates 90s-110s, occasionally to the 130s- Personally Reviewed  ECG    No new tracings for review  Physical Exam   Per MD  Labs    High Sensitivity Troponin:   Recent Labs  Lab 02/11/19 1836 02/11/19 2045  TROPONINIHS 10 10      Chemistry Recent Labs  Lab 02/11/19 1836 02/11/19 1836 02/12/19 0544  02/13/19 0322 02/15/19 0504 02/16/19 0257 02/17/19 0648  NA 145   < > 143   < > 138 134* 134*  K 4.7   < > 3.9   < > 3.3* 3.2* 3.2*  CL 101   < > 101   < > 96* 97* 94*  CO2 30   < > 29   < > 28 27 25   GLUCOSE 131*   < > 138*   < > 130* 104* 98  BUN 43*   < > 36*   < > 20 21 17   CREATININE 1.71*   < > 1.46*   < > 1.54* 1.49* 1.40*  CALCIUM 8.8*   < > 8.4*   < > 8.3* 8.0* 8.1*  PROT 6.8  --  6.1*  --   --   --   --   ALBUMIN 3.4*  --  3.0*  --   --   --   --   AST 19  --  15  --   --   --   --   ALT 22  --  20  --   --   --   --   ALKPHOS 78  --  69  --   --   --   --  BILITOT 0.7  --  0.8  --   --   --   --   GFRNONAA 36*   < > 44*   < > 41* 43* 46*  GFRAA 42*   < > 51*   < > 48* 50* 54*  ANIONGAP 14   < > 13   < > 14 10 15    < > = values in this interval not displayed.     Hematology Recent Labs  Lab 02/15/19 0504 02/16/19 0257 02/17/19 0648  WBC 15.3* 12.4* 15.0*  RBC 4.41 3.87* 4.31  HGB 13.0 11.6* 12.9*  HCT 38.9* 34.4* 38.5*  MCV 88.2 88.9 89.3  MCH 29.5 30.0 29.9  MCHC 33.4 33.7 33.5  RDW 13.1 13.6 13.3  PLT 158 128* 161    BNPNo results for input(s): BNP, PROBNP in the last 168 hours.   DDimer No results for input(s): DDIMER in the last 168 hours.   Radiology    DG Abd Portable 1V  Result Date: 02/16/2019 CLINICAL DATA:  Small-bowel obstruction EXAM: PORTABLE ABDOMEN - 1 VIEW COMPARISON:  02/15/2019 FINDINGS: NG tube pulled back with the side hole now at the GE junction. Tip in the stomach. Mild small bowel dilatation unchanged. Colon decompressed. Contrast in the colon and rectum. IMPRESSION: Mild small bowel dilatation unchanged. NG pulled back with the side hole at the GE junction. Electronically Signed   By: Franchot Gallo M.D.   On: 02/16/2019 08:35   DG Abd Portable 1V  Result Date: 02/15/2019 CLINICAL DATA:  NG tube placement.  COVID positive. EXAM: PORTABLE ABDOMEN - 1 VIEW COMPARISON:  02/13/2019.  CT 02/12/2019. FINDINGS: NG tube noted with tip  and side hole below left hemidiaphragm position of the stomach. Surgical clips right upper quadrant. Dilated loops of small bowel noted. Oral contrast in the colon. Similar findings noted on prior exam. No free air. IMPRESSION: 1.  NG tube noted with tip and side hole in the stomach. 2. Persistent dilated loops of small bowel. Contrast in the colon. Similar findings noted on prior exam. Electronically Signed   By: [place]   On: 02/15/2019 12:56    Cardiac Studies   Echocardiogram 02/13/2019: Impressions: 1. Left ventricular ejection fraction, by visual estimation, is 60 to 65%. The left ventricle has normal function. There is moderately increased left ventricular hypertrophy. 2. Definity contrast agent was given IV to delineate the left ventricular endocardial borders. 3. Left ventricular diastolic parameters are indeterminate. 4. The left ventricle has no regional wall motion abnormalities. 5. Global right ventricle has normal systolic function.The right ventricular size is normal. Right vetricular wall thickness was not assessed. 6. Left atrial size was severely dilated. 7. Right atrial size was normal. 8. The mitral valve is grossly normal. No evidence of mitral valve regurgitation. 9. The tricuspid valve is grossly normal. 10. The tricuspid valve is grossly normal. Tricuspid valve regurgitation is mild. 11. The aortic valve is tricuspid. Aortic valve regurgitation is not visualized. No evidence of aortic valve sclerosis or stenosis. 12. The pulmonic valve was grossly normal. Pulmonic valve regurgitation is not visualized. 13. Mildly elevated pulmonary artery systolic pressure. 14. The interatrial septum was not well visualized.   Patient Profile     84 y.o. male with a history of CAD s.p DES to LAD in 01/2009 with medical management recommended for RCA disease, hypertension, hyperlipidemia, hypothyroidism, who is being seen for new onset atrial fibrillation in the  setting of COVID-19 infection and recurrent small bowel obstruction  Assessment & Plan    New onset atrial fibrillation -Echo showed LVEF of 60-65% with severely dilated left atrium. -Patient has had hypokalemia. Potassium 3.2 today.  Magnesium 1.7.  Being supplemented with IV potassium runs.  Will give magnesium 2 g IV. -TSH was low at 0.026.  Free T4 was high at 1.76.  Free T3 normal.  May benefit from methimazole or other therapy due to difficult to control rates.  Will defer to primary team.  Patient reported that he had been to general surgery to evaluate thyroid, no malignant lesions. -Patient continues on IV amiodarone, digoxin 0.125 mg and metoprolol 10 mg IV every 4 hours. -Continues in A. fib with rates suboptimally controlled 90s-110s, occasionally up to 130s.  Mostly asymptomatic. -CHA2DS2/VAS Stroke Risk Score is at least 4 (CAD, HTN, Age (2)) Initially on IV heparin for anticoagulation but this was discontinued, resumed yesterday.  -Plan to switch to oral meds once rates controlled and taking po's well. Plan to switch to DOAC once not found to need surgery. Would need to watch dosing closely with his age over 47 and SCr hovering around 1.5, 1.4 today.  Small bowel obstruction/recurrent ventral incisional hernia -Patient has been evaluated by general surgery.  NG tube has been discontinued and he was placed on clear liquids. -Currently treating conservatively, trying to avoid surgery during this admission given recent Covid diagnosis, recent steroid use and new onset A. Fib.  CAD -Stable -Plan to resume home statin and beta blocker once taking orals. Aspirin stopped due to need for coagulation.  Hypertension -Blood pressure mildly elevated 140s-150.  Home medications including amlodipine 5 mg and bisoprolol-hydrochlorothiazide 2.5-6.25 mg, irbesartan 300 mg currently on hold.  Resume home medications when taking oral medications and renal function stable.  AKI -Unknown  baseline.  Serum creatinine 1.71 on presentation, appears to have stabilized at 1.40 today. -Continue to monitor daily labs.  COVID-19 infection -Management per primary team      For questions or updates, please contact [place] Please consult [email or web address] for contact info under        Signed, Daune Perch, NP  02/17/2019, 9:03 AM    Personally seen and examined. Agree with above.   Permanent atrial fibrillation -Under better rate control currently on IV diltiazem IV amiodarone, IV digoxin -Transition to p.o. once able. -Heart rates currently 105 compared to 140 earlier this week.  Chronic anticoagulation -On IV heparin.  Transition to Eliquis when able.  Feels better with NG tube out. Lungs sound clear.  Candee Furbish, MD

## 2019-02-17 NOTE — Progress Notes (Addendum)
Patient ID: Derek Foster, male   DOB: [birth year], 84 y.o.   MRN: [id number]  PROGRESS NOTE    DONNIS SNOWDON  [id number] DOB: [birth year] DOA: 02/11/2019 PCP: Celene Squibb, MD   Brief Narrative:  84 year old male with history of CAD status post stent in 2011, hypertension, hyperlipidemia, GERD, hypothyroidism presented with abdominal pain, nausea, vomiting with generalized weakness since his recent Covid diagnosis on 01/30/20 (patient's wife passed away from COVID-64 2 weeks ago). In the ED, he was noted to be in A. fib with RVR with rates in 140s to 160s and small bowel obstruction.  Cardiology and general surgery were consulted.  Treated with amiodarone drip/Cardizem drip/IV metoprolol and digoxin.  SBO was treated with NG tube and bowel rest.  Assessment & Plan:   New onset paroxysmal atrial fibrillation/flutter with rapid ventricular response -Still tachycardic but heart rate is improving.  Cardiology following.  Currently on amiodarone and Cardizem drips.  Also on IV metoprolol and digoxin.  Continue heparin -2D echo showed preserved EF -TSH/T4 consistent with hyperthyroidism.  Will need to start methimazole low-dose once able to tolerate orally  Small bowel obstruction Recurrent ventral incisional hernia -Likely secondary to adhesions from prior surgeries and ventral hernia -General surgery following.  Currently has NG tube which was clamped on 02/16/2019.  General surgery planning to remove NG tube today. -Diet advancement as per general surgery  Hypokalemia  -replace.  Repeat a.m. labs  Acute kidney injury -Likely secondary to GI losses.  Slightly improving.  Continue IV fluids.  Thrombocytopenia -Questionable cause.  Resolved.  No signs of bleeding  Leukocytosis -Most likely reactive.  Recent COVID-19 infection -Patient reports being diagnosed on 01/30/23 at his PCP Dr. Josue Hector office completed 3-week isolation period on 1/21, repeat Covid PCR was positive on 1/22,  suspect this is residual viral fragments, I called Dr. Josue Hector office and was able to confirm original positive Covid diagnosis on 01/30/2023 -Did have generalized weakness secondary to this without respiratory symptoms  History of CAD -Remote history of stents -Stable  Hypertension -Currently on Cardizem drip and IV metoprolol.  BP stable  GERD -IV PPI  Generalized deconditioning -Prognosis is guarded.  Will need PT eval once patient is more stable   DVT prophylaxis: Heparin drip Code Status: DNR Family Communication: Spoke to patient at bedside.  Called daughter/[name] on phone on 02/17/2019 but she did not pick up Disposition Plan: We will remain inpatient for now.  Still tachycardic on Cardizem and amiodarone drips and SBO has not resolved yet.  Consultants: Cardiology/general surgery  Procedures: None  Antimicrobials:  None  Subjective: Patient seen and examined at bedside.  Poor historian.  Passing some gas.  Denies worsening shortness of breath or chest pain.  No overnight fever reported.  Objective: Vitals:   02/16/19 2115 02/16/19 2346 02/16/19 2353 02/17/19 0000  BP:  (!) 150/76  (!) 141/78  Pulse: (!) 107 (!) 131 (!) 104 (!) 106  Resp: 19     Temp:      TempSrc:      SpO2: 91% 93% 93% 93%  Weight:      Height:        Intake/Output Summary (Last 24 hours) at 02/17/2019 0752 Last data filed at 02/17/2019 0348 Gross per 24 hour  Intake 2905.67 ml  Output 500 ml  Net 2405.67 ml   Filed Weights   02/11/19 1832  Weight: 128.4 kg    Examination:  General exam: No distress.  Poor historian.  Looks chronically ill. ENT: NG tube still present. Respiratory system: Bilateral decreased breath sounds at bases with some crackles.  No wheezing  cardiovascular system: Tachycardic, S1-S2 heard gastrointestinal system: Abdomen is slightly distended, soft and mildly tender.  Bowel sounds are sluggish.  Left-sided ventral hernia present  extremities: No cyanosis;  trace bilateral lower extremity edema present Central nervous system: Awake and alert.  No focal neurological deficits. Moving extremities.  Poor historian Skin: No ulcers or rashes Psychiatry: Has flat affect    Data Reviewed: I have personally reviewed following labs and imaging studies  CBC: Recent Labs  Lab 02/11/19 1836 02/12/19 0544 02/13/19 1108 02/14/19 0730 02/15/19 0504 02/16/19 0257 02/17/19 0648  WBC 12.9*   < > 7.4 10.2 15.3* 12.4* 15.0*  NEUTROABS 10.1*  --   --   --   --   --  11.9*  HGB 13.8   < > 10.7* 12.3* 13.0 11.6* 12.9*  HCT 42.5   < > 33.9* 38.2* 38.9* 34.4* 38.5*  MCV 92.6   < > 93.1 91.4 88.2 88.9 89.3  PLT 192   < > 131* 143* 158 128* 161   < > = values in this interval not displayed.   Basic Metabolic Panel: Recent Labs  Lab 02/13/19 0322 02/13/19 1108 02/14/19 0744 02/15/19 0504 02/16/19 0257 02/17/19 0648  NA 142  --  140 138 134* 134*  K 3.5  --  3.1* 3.3* 3.2* 3.2*  CL 102  --  101 96* 97* 94*  CO2 30  --  29 28 27 25   GLUCOSE 124*  --  111* 130* 104* 98  BUN 27*  --  20 20 21 17   CREATININE 1.54*  --  1.39* 1.54* 1.49* 1.40*  CALCIUM 8.2*  --  8.2* 8.3* 8.0* 8.1*  MG  --  1.9  --   --   --  1.7   GFR: Estimated Creatinine Clearance: 57.1 mL/min (A) (by C-G formula based on SCr of 1.4 mg/dL (H)). Liver Function Tests: Recent Labs  Lab 02/11/19 1836 02/12/19 0544  AST 19 15  ALT 22 20  ALKPHOS 78 69  BILITOT 0.7 0.8  PROT 6.8 6.1*  ALBUMIN 3.4* 3.0*   No results for input(s): LIPASE, AMYLASE in the last 168 hours. No results for input(s): AMMONIA in the last 168 hours. Coagulation Profile: No results for input(s): INR, PROTIME in the last 168 hours. Cardiac Enzymes: No results for input(s): CKTOTAL, CKMB, CKMBINDEX, TROPONINI in the last 168 hours. BNP (last 3 results) No results for input(s): PROBNP in the last 8760 hours. HbA1C: No results for input(s): HGBA1C in the last 72 hours. CBG: No results for input(s):  GLUCAP in the last 168 hours. Lipid Profile: No results for input(s): CHOL, HDL, LDLCALC, TRIG, CHOLHDL, LDLDIRECT in the last 72 hours. Thyroid Function Tests: No results for input(s): TSH, T4TOTAL, FREET4, T3FREE, THYROIDAB in the last 72 hours. Anemia Panel: No results for input(s): VITAMINB12, FOLATE, FERRITIN, TIBC, IRON, RETICCTPCT in the last 72 hours. Sepsis Labs: No results for input(s): PROCALCITON, LATICACIDVEN in the last 168 hours.  Recent Results (from the past 240 hour(s))  Respiratory Panel by RT PCR (Flu A&B, Covid) - Nasopharyngeal Swab     Status: Abnormal   Collection Time: 02/11/19  7:10 PM   Specimen: Nasopharyngeal Swab  Result Value Ref Range Status   SARS Coronavirus 2 by RT PCR POSITIVE (A) NEGATIVE Final    Comment: CRITICAL RESULT CALLED TO, READ BACK BY AND  VERIFIED WITH: ANDREWS,L. AT 2018 ON 02/11/2019 BY EVA (NOTE) SARS-CoV-2 target nucleic acids are DETECTED. SARS-CoV-2 RNA is generally detectable in upper respiratory specimens  during the acute phase of infection. Positive results are indicative of the presence of the identified virus, but do not rule out bacterial infection or co-infection with other pathogens not detected by the test. Clinical correlation with patient history and other diagnostic information is necessary to determine patient infection status. The expected result is Negative. Fact Sheet for Patients:  PinkCheek.be Fact Sheet for Healthcare Providers: GravelBags.it This test is not yet approved or cleared by the [place] FDA and  has been authorized for detection and/or diagnosis of SARS-CoV-2 by FDA under an Emergency Use Authorization (EUA).  This EUA will remain in effect (meaning this test c an be used) for the duration of  the COVID-19 declaration under Section 564(b)(1) of the Act, 21 U.S.C. section 360bbb-3(b)(1), unless the authorization is terminated or  revoked sooner.    Influenza A by PCR NEGATIVE NEGATIVE Final   Influenza B by PCR NEGATIVE NEGATIVE Final    Comment: (NOTE) The Xpert Xpress SARS-CoV-2/FLU/RSV assay is intended as an aid in  the diagnosis of influenza from Nasopharyngeal swab specimens and  should not be used as a sole basis for treatment. Nasal washings and  aspirates are unacceptable for Xpert Xpress SARS-CoV-2/FLU/RSV  testing. Fact Sheet for Patients: PinkCheek.be Fact Sheet for Healthcare Providers: GravelBags.it This test is not yet approved or cleared by the [place] FDA and  has been authorized for detection and/or diagnosis of SARS-CoV-2 by  FDA under an Emergency Use Authorization (EUA). This EUA will remain  in effect (meaning this test can be used) for the duration of the  Covid-19 declaration under Section 564(b)(1) of the Act, 21  U.S.C. section 360bbb-3(b)(1), unless the authorization is  terminated or revoked. Performed at [facility], [address], [place],  [address]          Radiology Studies: DG Abd Portable 1V  Result Date: 02/16/2019 CLINICAL DATA:  Small-bowel obstruction EXAM: PORTABLE ABDOMEN - 1 VIEW COMPARISON:  02/15/2019 FINDINGS: NG tube pulled back with the side hole now at the GE junction. Tip in the stomach. Mild small bowel dilatation unchanged. Colon decompressed. Contrast in the colon and rectum. IMPRESSION: Mild small bowel dilatation unchanged. NG pulled back with the side hole at the GE junction. Electronically Signed   By: Franchot Gallo M.D.   On: 02/16/2019 08:35   DG Abd Portable 1V  Result Date: 02/15/2019 CLINICAL DATA:  NG tube placement.  COVID positive. EXAM: PORTABLE ABDOMEN - 1 VIEW COMPARISON:  02/13/2019.  CT 02/12/2019. FINDINGS: NG tube noted with tip and side hole below left hemidiaphragm position of the stomach. Surgical clips right upper quadrant. Dilated loops of small bowel noted.  Oral contrast in the colon. Similar findings noted on prior exam. No free air. IMPRESSION: 1.  NG tube noted with tip and side hole in the stomach. 2. Persistent dilated loops of small bowel. Contrast in the colon. Similar findings noted on prior exam. Electronically Signed   By: Garden   On: 02/15/2019 12:56        Scheduled Meds: . digoxin  0.125 mg Intravenous Daily  . guaiFENesin  15 mL Per Tube Q6H  . metoprolol tartrate  10 mg Intravenous Q4H  . pantoprazole (PROTONIX) IV  40 mg Intravenous Q12H   Continuous Infusions: . sodium chloride 50 mL/hr at 02/17/19 0347  .  amiodarone 30 mg/hr (02/17/19 0532)  . diltiazem (CARDIZEM) infusion 15 mg/hr (02/17/19 0531)  . heparin 1,250 Units/hr (02/17/19 0348)          Aline August, MD [facility] 02/17/2019, 7:52 AM

## 2019-02-17 NOTE — Progress Notes (Signed)
ANTICOAGULATION CONSULT NOTE - Follow Up Consult  Pharmacy Consult for Heparin Indication: atrial fibrillation  Allergies  Allergen Reactions  . Indomethacin Anaphylaxis    But tolerates ibuprofen, aleve  . Iodine-131 Anaphylaxis    Patient Measurements: Height: 6\' 1"  (185.4 cm) Weight: 283 lb (128.4 kg) IBW/kg (Calculated) : 79.9 Heparin Dosing Weight: 108.4 kg  Vital Signs: Temp: 98.3 F (36.8 C) (01/27 2044) Temp Source: Oral (01/27 2044) BP: 141/78 (01/28 0000) Pulse Rate: 106 (01/28 0000)  Labs: Recent Labs    02/15/19 0504 02/15/19 0504 02/16/19 0257 02/16/19 1934 02/17/19 0648  HGB 13.0   < > 11.6*  --  12.9*  HCT 38.9*  --  34.4*  --  38.5*  PLT 158  --  128*  --  161  HEPARINUNFRC 0.37  --   --  0.10* 0.38  CREATININE 1.54*  --  1.49*  --  1.40*   < > = values in this interval not displayed.    Estimated Creatinine Clearance: 57.1 mL/min (A) (by C-G formula based on SCr of 1.4 mg/dL (H)).   Assessment:  Anticoag:Heparin for afib with RVR (new diagnosis) while has bowel obstruction. No Ac PTA.  IV heparin stopped 1/26 for copious brown/ bloody NG output.  Resume heparin 1/27. NG output down to 33ml yesterday. Hep level 0.38 in goal. Hgb 12.9 stable. Plt 161 stable. - CHAd2 Vasc score 4   Goal of Therapy:  Heparin level 0.3-0.7 units/ml Monitor platelets by anticoagulation protocol: Yes   Plan:  IV heparin at 1250 units (no bolus) - Will have to monitor for Hgb and any other copious bloody NG output closely. Daily HL, CBC F/u resuming PTA PO meds when able  F/u for K+ supplementation today on IV digoxin  Gerrard Tanji Storrs S. Alford Highland, PharmD, BCPS Clinical Staff Pharmacist [email or web address] Alford Highland, [employer] 02/17/2019,8:30 AM

## 2019-02-17 NOTE — Progress Notes (Signed)
Initial Nutrition Assessment  DOCUMENTATION CODES:   Obesity unspecified  INTERVENTION:   -Boost Breeze po TID, each supplement provides 250 kcal and 9 grams of protein -MVI with minerals daily -RD will follow for diet advancement and adjust supplements regimen as appropriate -If prolonged NPO/ clear liquid diet status is anticipated, consider initiation of nutrition support  NUTRITION DIAGNOSIS:   Increased nutrient needs related to acute illness(COVID-19) as evidenced by estimated needs.  GOAL:   Patient will meet greater than or equal to 90% of their needs  MONITOR:   PO intake, Supplement acceptance, Diet advancement, Labs, Weight trends, Skin, I & O's  REASON FOR ASSESSMENT:   NPO/Clear Liquid Diet    ASSESSMENT:   Derek Foster  is a 84 y.o. male, with past medical history of CAD, status post stent in 2011, hypertension, hyperlipidemia, GERD, hypothyroidism, patient presents with complaints of abdominal pain, nausea, vomiting for last 24 hours, patient report overall generalized weakness, since his recent Covid diagnosis 12/30,(patient's wife passed away from COVID-19 2 weeks ago), patient denies any chest pain, fever, chills, hemoptysis, diarrhea, as well patient reports he has generalized weakness since his COVID-19 diagnosis, has been mainly using his wheelchair, as well he reports poor appetite, did not require admission or any treatment for his COVID-19.- in ED patient was noted to be in A. fib with RVR, which is new diagnosis, where he required Cardizem drip, initial heart rate 162, as well patient was started on heparin GTT, patient on baby aspirin daily for history of CAD, patient was afebrile, ED, but mild leukocytosis of 12.9, he denies any dysuria or cough, patient creatinine was noted to be elevated at 1.7, no recent baseline to compare, heart rate has improved in the 130s with Cardizem drip, I was called to admit.  Pt admitted with new onset a-fib.   1/23- NGT  placed for deocmpression due to SBO 1/25- NGT clamped 1/28- NGT removed, advanced to clear liquid diet  Reviewed I/O's: +2.4 L x 24 hours and +1.5 L since admission  UOP: 200 ml x 24 hours  NGT output: 300 ml x 24 hours  Attempted to speak with pt via phone x 2, however, no answer. RD unable to obtain further nutrition-related history at this time.   Pt has received inadequate nutrition during hospitalization (6 days) due to NPO/ clear liquid status as a result of SBO. Hopeful to avoid surgery secondary to COVID-19+ status.   Per chart review, NGT removed today and is tolerating clear liquids well.   Reviewed wt hx, which reveals wt stability over the past 10 years.   Given increased nutritional needs, pt would greatly benefit from addition of oral nutrition supplements.   Medications reviewed and include 0.9% sodium chloride infusion @ 50 ml/hr.   Labs reviewed: Na: 134, K: 3.2 (on IV supplementation).   Diet Order:   Diet Order            Diet clear liquid Room service appropriate? Yes; Fluid consistency: Thin  Diet effective now              EDUCATION NEEDS:   No education needs have been identified at this time  Skin:  Skin Assessment: Reviewed RN Assessment  Last BM:  02/11/19  Height:   Ht Readings from Last 1 Encounters:  02/11/19 6\' 1"  (1.854 m)    Weight:   Wt Readings from Last 1 Encounters:  02/11/19 128.4 kg    Ideal Body Weight:  83.6 kg  BMI:  Body mass index is 37.34 kg/m.  Estimated Nutritional Needs:   Kcal:  2300-2500  Protein:  110-125 grams  Fluid:  > 2.3 L    Derek Foster, RD, LDN, [place] Registered Dietitian II Certified Diabetes Care and Education Specialist Pager: [phone] After hours Pager: [phone]

## 2019-02-18 LAB — BASIC METABOLIC PANEL
Anion gap: 8 (ref 5–15)
BUN: 14 mg/dL (ref 8–23)
CO2: 26 mmol/L (ref 22–32)
Calcium: 7.8 mg/dL — ABNORMAL LOW (ref 8.9–10.3)
Chloride: 100 mmol/L (ref 98–111)
Creatinine, Ser: 1.21 mg/dL (ref 0.61–1.24)
GFR calc Af Amer: >60 mL/min (ref >60)
GFR calc non Af Amer: 55 mL/min — ABNORMAL LOW (ref >60)
Glucose, Bld: 118 mg/dL — ABNORMAL HIGH (ref 70–99)
Potassium: 3.4 mmol/L — ABNORMAL LOW (ref 3.5–5.1)
Sodium: 134 mmol/L — ABNORMAL LOW (ref 135–145)

## 2019-02-18 LAB — CBC
HCT: 34.8 % — ABNORMAL LOW (ref 39.0–52.0)
Hemoglobin: 11.7 g/dL — ABNORMAL LOW (ref 13.0–17.0)
MCH: 29.7 pg (ref 26.0–34.0)
MCHC: 33.6 g/dL (ref 30.0–36.0)
MCV: 88.3 fL (ref 80.0–100.0)
Platelets: 134 10*3/uL — ABNORMAL LOW (ref 150–400)
RBC: 3.94 MIL/uL — ABNORMAL LOW (ref 4.22–5.81)
RDW: 13.7 % (ref 11.5–15.5)
WBC: 7.5 10*3/uL (ref 4.0–10.5)
nRBC: 0 % (ref 0.0–0.2)

## 2019-02-18 LAB — HEPARIN LEVEL (UNFRACTIONATED): Heparin Unfractionated: 0.65 IU/mL (ref 0.30–0.70)

## 2019-02-18 MED ORDER — BISACODYL 10 MG RE SUPP
10.0000 mg | Freq: Once | RECTAL | Status: AC
  Administered 2019-02-18: 08:00:00 10 mg via RECTAL
  Filled 2019-02-18: qty 1

## 2019-02-18 MED ORDER — POTASSIUM CHLORIDE 10 MEQ/100ML IV SOLN: 10.0000 meq | INTRAVENOUS | Status: DC

## 2019-02-18 MED ORDER — BOOST / RESOURCE BREEZE PO LIQD CUSTOM
1.0000 | Freq: Two times a day (BID) | ORAL | Status: DC
  Administered 2019-02-22 – 2019-03-12 (×22): 1 via ORAL

## 2019-02-18 MED ORDER — ENSURE ENLIVE PO LIQD
237.0000 mL | Freq: Two times a day (BID) | ORAL | Status: DC
  Administered 2019-02-22 – 2019-02-24 (×3): 237 mL via ORAL

## 2019-02-18 MED ORDER — BOOST / RESOURCE BREEZE PO LIQD CUSTOM: 1.0000 | Freq: Every day | ORAL | Status: DC

## 2019-02-18 MED ORDER — POTASSIUM CHLORIDE CRYS ER 20 MEQ PO TBCR
40.0000 meq | EXTENDED_RELEASE_TABLET | Freq: Once | ORAL | Status: AC
  Administered 2019-02-18: 40 meq via ORAL
  Filled 2019-02-18: qty 2

## 2019-02-18 MED ORDER — POLYETHYLENE GLYCOL 3350 17 G PO PACK: 17.0000 g | PACK | Freq: Every day | ORAL | Status: DC | PRN

## 2019-02-18 MED ORDER — DOCUSATE SODIUM 100 MG PO CAPS
100.0000 mg | ORAL_CAPSULE | Freq: Two times a day (BID) | ORAL | Status: DC
  Administered 2019-02-18 – 2019-03-07 (×30): 100 mg via ORAL
  Filled 2019-02-18 (×35): qty 1

## 2019-02-18 NOTE — Progress Notes (Signed)
Patient receiving clear liquid tray for breakfast as full liquid order was placed after breakfast trays were delivered to unit. If patient tolerates full liquid for lunch, soft dinner will be initiated for dinner.   Hiram Comber, RN 02/18/2019 12:29 PM

## 2019-02-18 NOTE — Progress Notes (Signed)
Patient's heart rate elevated to 140's while consuming dinner tray.

## 2019-02-18 NOTE — Progress Notes (Signed)
Discussed with nursing staff. Patient tolerated FLD for lunch and had a BM today. Will adv diet to soft.  We will follow peripherally.

## 2019-02-18 NOTE — Progress Notes (Signed)
       Subjective: CC:  Patient happy NGT is out. He is tolerating cld. No n/v, pain,or bloating. He is passing flatus. Still no BM. He got up in chair yesterday. Did not ambulate.   Objective: Vital signs in last 24 hours: Temp:  [97.8 F (36.6 C)-99 F (37.2 C)] 97.8 F (36.6 C) (01/29 0343) Pulse Rate:  [79-110] 96 (01/29 0400) Resp:  [16-18] 18 (01/29 0343) BP: (121-154)/(59-72) 127/60 (01/29 0400) SpO2:  [90 %-95 %] 90 % (01/29 0400) Last BM Date: 02/11/19  Intake/Output from previous day: 01/28 0701 - 01/29 0700 In: 2712 [P.O.:120; I.V.:2081; IV Piggyback:511] Out: [id number] [Urine:1150] Intake/Output this shift: No intake/output data recorded.  PE: Gen: Alert, NAD, pleasant Heart: Tachy, irr, HR better, in the 100's today  Pulm: Lungs clear b/l. Cough improved.  Abd: Soft,ND, NT. Normoactivebowel sounds. Left-sided ventral hernia that iseasily reducible.Hernia is non tender. Midline wound from prior surgery noted and well healed.  Lab Results:  Recent Labs    02/16/19 0257 02/17/19 0648  WBC 12.4* 15.0*  HGB 11.6* 12.9*  HCT 34.4* 38.5*  PLT 128* 161   BMET Recent Labs    02/16/19 0257 02/17/19 0648  NA 134* 134*  K 3.2* 3.2*  CL 97* 94*  CO2 27 25  GLUCOSE 104* 98  BUN 21 17  CREATININE 1.49* 1.40*  CALCIUM 8.0* 8.1*   PT/INR No results for input(s): LABPROT, INR in the last 72 hours. CMP     Component Value Date/Time   NA 134 (L) 02/17/2019 0648   K 3.2 (L) 02/17/2019 0648   CL 94 (L) 02/17/2019 0648   CO2 25 02/17/2019 0648   GLUCOSE 98 02/17/2019 0648   BUN 17 02/17/2019 0648   CREATININE 1.40 (H) 02/17/2019 0648   CALCIUM 8.1 (L) 02/17/2019 0648   PROT 6.1 (L) 02/12/2019 0544   ALBUMIN 3.0 (L) 02/12/2019 0544   AST 15 02/12/2019 0544   ALT 20 02/12/2019 0544   ALKPHOS 69 02/12/2019 0544   BILITOT 0.8 02/12/2019 0544   GFRNONAA 46 (L) 02/17/2019 0648   GFRAA 54 (L) 02/17/2019 0648   Lipase  No results found for:  LIPASE     Studies/Results: No results found.  Anti-infectives: Anti-infectives (From admission, onward)   None       Assessment/Plan COVID POSITIVE 01/19/19 & 02/11/19 Aib/flutter with RVR -Cardizem/Amiodarone drips/Digoxin. Echo 1/24 w/ EF 60-65% Hx MI with PTCA/Stents Hypertension Hyperlipidemia Hypothyroid-cardiology recommending methimazole BMI 37 Recent loss of wife secondary to covid Recent prednisone taper Anemia  SBO Recurrent ventral incisional hernia - Hernia is reducible. Tolerating CLD. Adv diet as tolerated.  - Suppository this am. Bowel regimen  - If tolerates diet can be d/c'd from our standpoint  - Mobilize for bowel function. May benefit from PT/OT  FEN: FLD. Adv as tolerated to soft diet  VTE: SCDs,heparin gtt ID: None   LOS: 7 days    Jillyn Ledger , [facility] 02/18/2019, 8:26 AM Please see Amion for pager number during day hours 7:00am-4:30pm

## 2019-02-18 NOTE — Progress Notes (Signed)
Suppository administered as ordered. Patient had large, loose bowel movement following. Patient continues to have urge to defecate. Will continue to monitor.  Hiram Comber, RN 02/18/2019 10:13 AM

## 2019-02-18 NOTE — Evaluation (Signed)
Physical Therapy Evaluation Patient Details Name: Derek Foster MRN: [id number] DOB: May 18, 1935 Today's Date: 02/18/2019   History of Present Illness  84 year old male with history of CAD status post stent in 2011, hypertension, hyperlipidemia, GERD, hypothyroidism presented with abdominal pain, nausea, vomiting with generalized weakness since his recent Covid diagnosis on 13-Feb-2020 (patient's wife passed away from COVID-19 2 weeks ago).In the ED, he was noted to be in A. fib with RVR with rates in 140s to 160s and small bowel obstruction.  Cardiology and general surgery were consulted.  Clinical Impression  Patient presents with mobility limited due to decreased activity tolerance, decreased balance and general weakness.  Currently with HR up to 150's with in room ambulation.  Also with some decreased safety awareness and limited knowledge of use of DME.  Feel he may need STSNF level rehab at d/c unless family able to stay with him 24/7.  PT to follow acutely.     Follow Up Recommendations SNF;Supervision/Assistance - 24 hour(needs some assist and reports 24 hour assist not available.)    Equipment Recommendations  None recommended by PT    Recommendations for Other Services       Precautions / Restrictions Precautions Precautions: Fall Precaution Comments: watch HR      Mobility  Bed Mobility Overal bed mobility: Needs Assistance Bed Mobility: Supine to Sit     Supine to sit: Supervision;HOB elevated        Transfers Overall transfer level: Needs assistance Equipment used: Rolling walker (2 wheeled) Transfers: Sit to/from Stand Sit to Stand: Min assist         General transfer comment: assist for lifting initially, then minguard for balacne  Ambulation/Gait Ambulation/Gait assistance: Min assist Gait Distance (Feet): 30 Feet(& 12') Assistive device: Rolling walker (2 wheeled) Gait Pattern/deviations: Step-to pattern;Step-through pattern;Decreased stride  length;Shuffle;Trunk flexed     General Gait Details: cues for walker safety, assist for balance, HR up to 150 briefly with ambulation, sustained 130's-140's, back to 110's with rest in chair, RN in room to deliver meds for HR  Stairs            Wheelchair Mobility    Modified Rankin (Stroke Patients Only)       Balance Overall balance assessment: Needs assistance Sitting-balance support: Feet supported Sitting balance-Leahy Scale: Good Sitting balance - Comments: seated EOB able to scoot to edge on his own   Standing balance support: Bilateral upper extremity supported Standing balance-Leahy Scale: Poor Standing balance comment: UE support for balance due to weakness                             Pertinent Vitals/Pain Pain Assessment: No/denies pain    Home Living Family/patient expects to be discharged to:: Skilled nursing facility Living Arrangements: Alone Available Help at Discharge: Available PRN/intermittently;Family Type of Home: House Home Access: Stairs to enter   [employer] of Steps: 1 also has a ramp Home Layout: Laundry or work area in basement;Able to live on main level with bedroom/bathroom Home Equipment: Environmental consultant - 2 wheels;Wheelchair - manual;Tub bench;Grab bars - tub/shower;Grab bars - toilet Additional Comments: was caring for his wife prior to her recent death due to Covid    Prior Function Level of Independence: Independent               Hand Dominance        Extremity/Trunk Assessment   Upper Extremity Assessment Upper Extremity Assessment: Generalized weakness  Lower Extremity Assessment Lower Extremity Assessment: Generalized weakness    Cervical / Trunk Assessment Cervical / Trunk Assessment: Kyphotic  Communication   Communication: No difficulties  Cognition Arousal/Alertness: Awake/alert Behavior During Therapy: Flat affect Overall Cognitive Status: Within Functional Limits for tasks assessed                                         General Comments      Exercises     Assessment/Plan    PT Assessment Patient needs continued PT services  PT Problem List Decreased strength;Decreased activity tolerance;Decreased mobility;Cardiopulmonary status limiting activity;Decreased balance;Decreased knowledge of use of DME       PT Treatment Interventions DME instruction;Therapeutic activities;Balance training;Therapeutic exercise;Functional mobility training;Gait training;Patient/family education    PT Goals (Current goals can be found in the Care Plan section)  Acute Rehab PT Goals Patient Stated Goal: to get stronger PT Goal Formulation: With patient Time For Goal Achievement: 03/04/19 Potential to Achieve Goals: Fair    Frequency Min 3X/week   Barriers to discharge        Co-evaluation               AM-PAC PT "6 Clicks" Mobility  Outcome Measure Help needed turning from your back to your side while in a flat bed without using bedrails?: None Help needed moving from lying on your back to sitting on the side of a flat bed without using bedrails?: A Little Help needed moving to and from a bed to a chair (including a wheelchair)?: A Little Help needed standing up from a chair using your arms (e.g., wheelchair or bedside chair)?: A Little Help needed to walk in hospital room?: A Little Help needed climbing 3-5 steps with a railing? : A Little 6 Click Score: 19    End of Session   Activity Tolerance: Treatment limited secondary to medical complications (Comment)(HR up to 150 with ambulation) Patient left: in chair;with call bell/phone within reach   PT Visit Diagnosis: Other abnormalities of gait and mobility (R26.89);Difficulty in walking, not elsewhere classified (R26.2);Muscle weakness (generalized) (M62.81)    Time: [id number] PT Time Calculation (min) (ACUTE ONLY): 32 min   Charges:   PT Evaluation $PT Eval Moderate Complexity: 1 Mod PT  Treatments $Gait Training: 8-22 mins        Magda Kiel, [place] [facility] [phone] 02/18/2019   Reginia Naas 02/18/2019, 2:51 PM

## 2019-02-18 NOTE — Progress Notes (Signed)
Patient refusing scheduled boost & ensures. Patient states "I don't drink that kind of stuff, it's gross." patient educated on importance but continued to refuse.   Hiram Comber, RN 02/18/2019 3:16 PM

## 2019-02-18 NOTE — Progress Notes (Addendum)
Progress Note  Patient Name: Derek Foster Date of Encounter: 02/18/2019  Primary Cardiologist: Skeet Latch, MD   Subjective   Patient is without complaints today.  He says he is taking clear liquids and tolerating it well.  He was able to be up in the chair yesterday although he did not do any walking.  He did have some lightheadedness with getting up.  He denies any chest discomfort, shortness of breath or palpitations.  Inpatient Medications    Scheduled Meds: . digoxin  0.125 mg Intravenous Daily  . docusate sodium  100 mg Oral BID  . feeding supplement  1 Container Oral TID BM  . guaiFENesin  15 mL Per Tube Q6H  . metoprolol tartrate  10 mg Intravenous Q4H  . multivitamin with minerals  1 tablet Oral Daily  . pantoprazole (PROTONIX) IV  40 mg Intravenous Q12H   Continuous Infusions: . sodium chloride 50 mL/hr at 02/17/19 2005  . amiodarone 30 mg/hr (02/18/19 0459)  . diltiazem (CARDIZEM) infusion 15 mg/hr (02/18/19 0005)  . heparin 1,250 Units/hr (02/18/19 0233)  . magnesium sulfate bolus IVPB Stopped (02/17/19 1145)  . potassium chloride     PRN Meds: acetaminophen **OR** acetaminophen, guaiFENesin-dextromethorphan, menthol-cetylpyridinium, ondansetron (ZOFRAN) IV, phenol, polyethylene glycol   Vital Signs    Vitals:   02/18/19 0400 02/18/19 0756 02/18/19 0855 02/18/19 1017  BP: 127/60 (!) 149/79    Pulse: 96 (!) 105  94  Resp:  (!) 21  16  Temp:   98.5 F (36.9 C)   TempSrc:   Oral   SpO2: 90% 95%  93%  Weight:      Height:        Intake/Output Summary (Last 24 hours) at 02/18/2019 1042 Last data filed at 02/18/2019 1000 Gross per 24 hour  Intake 2849.28 ml  Output 1450 ml  Net 1399.28 ml   Last 3 Weights 02/11/2019 06/16/2012 02/25/2011  Weight (lbs) 283 lb 282 lb 272 lb  Weight (kg) 128.368 kg 127.914 kg 123.378 kg      Telemetry    Atrial fibrillation in the 80s- Personally Reviewed  ECG    No new tracings for review  Physical Exam    Per MD  Labs    High Sensitivity Troponin:   Recent Labs  Lab 02/11/19 1836 02/11/19 2045  TROPONINIHS 10 10      Chemistry Recent Labs  Lab 02/11/19 1836 02/11/19 1836 02/12/19 0544 02/13/19 0322 02/16/19 0257 02/17/19 0648 02/18/19 0832  NA 145   < > 143   < > 134* 134* 134*  K 4.7   < > 3.9   < > 3.2* 3.2* 3.4*  CL 101   < > 101   < > 97* 94* 100  CO2 30   < > 29   < > 27 25 26   GLUCOSE 131*   < > 138*   < > 104* 98 118*  BUN 43*   < > 36*   < > 21 17 14   CREATININE 1.71*   < > 1.46*   < > 1.49* 1.40* 1.21  CALCIUM 8.8*   < > 8.4*   < > 8.0* 8.1* 7.8*  PROT 6.8  --  6.1*  --   --   --   --   ALBUMIN 3.4*  --  3.0*  --   --   --   --   AST 19  --  15  --   --   --   --  ALT 22  --  20  --   --   --   --   ALKPHOS 78  --  69  --   --   --   --   BILITOT 0.7  --  0.8  --   --   --   --   GFRNONAA 36*   < > 44*   < > 43* 46* 55*  GFRAA 42*   < > 51*   < > 50* 54* >60  ANIONGAP 14   < > 13   < > 10 15 8    < > = values in this interval not displayed.     Hematology Recent Labs  Lab 02/16/19 0257 02/17/19 0648 02/18/19 0832  WBC 12.4* 15.0* 7.5  RBC 3.87* 4.31 3.94*  HGB 11.6* 12.9* 11.7*  HCT 34.4* 38.5* 34.8*  MCV 88.9 89.3 88.3  MCH 30.0 29.9 29.7  MCHC 33.7 33.5 33.6  RDW 13.6 13.3 13.7  PLT 128* 161 134*    BNPNo results for input(s): BNP, PROBNP in the last 168 hours.   DDimer No results for input(s): DDIMER in the last 168 hours.   Radiology    No results found.  Cardiac Studies   Echocardiogram 02/13/2019: Impressions: 1. Left ventricular ejection fraction, by visual estimation, is 60 to 65%. The left ventricle has normal function. There is moderately increased left ventricular hypertrophy. 2. Definity contrast agent was given IV to delineate the left ventricular endocardial borders. 3. Left ventricular diastolic parameters are indeterminate. 4. The left ventricle has no regional wall motion abnormalities. 5. Global right  ventricle has normal systolic function.The right ventricular size is normal. Right vetricular wall thickness was not assessed. 6. Left atrial size was severely dilated. 7. Right atrial size was normal. 8. The mitral valve is grossly normal. No evidence of mitral valve regurgitation. 9. The tricuspid valve is grossly normal. 10. The tricuspid valve is grossly normal. Tricuspid valve regurgitation is mild. 11. The aortic valve is tricuspid. Aortic valve regurgitation is not visualized. No evidence of aortic valve sclerosis or stenosis. 12. The pulmonic valve was grossly normal. Pulmonic valve regurgitation is not visualized. 13. Mildly elevated pulmonary artery systolic pressure. 14. The interatrial septum was not well visualized.  Patient Profile     84 y.o. male with a history of CAD s.p DES to LAD in 01/2009 with medical management recommended for RCA disease, hypertension, hyperlipidemia, hypothyroidism, who is being seen for new onset atrial fibrillation in the setting of COVID-19 infection and recurrent small bowel obstruction.  Assessment & Plan    Atrial fibrillation, new onset -Echo showed LVEF of 60-65% with severely dilated left atrium. -Patient has had hypokalemia.  Being supplemented. Potassium 3.4 today.   -TSH was low at 0.026.  Free T4 was high at 1.76.  Free T3 normal.  May benefit from methimazole or other therapy due to difficult to control rates.  Will defer to primary team.  Patient reported that he had been to general surgery to evaluate thyroid, no malignant lesions. -Patient continues on IV amiodarone, digoxin 0.125 mg, diltiazem 15 mg/h and metoprolol 10 mg IV every 4 hours. -Continues in A. fib with rates now mostly in the 80s.  Much better controlled. -CHA2DS2/VAS Stroke Risk Score is at least 4 (CAD, HTN, Age (2)).  On IV heparin for anticoagulation with plan to switch to oral Eliquis once taking oral medications.  Renal function has improved with serum creatinine  of 1.21 today renal function has  improved with serum creatinine 1.21 today, so Eliquis dose would be 5 mg twice daily. -Plan to switch rate control medications to oral dosing when okay with surgery. Likely amiodarone 400 mg daily and metoprolol 100 mg BID and stop hop bisoprolol. Will defer cardizem (has been getting 360 mg daily by IV) and digoxin to Dr. Marlou Porch.   Small bowel obstruction/recurrent ventral incisional hernia -Patient has been evaluated by general surgery.  NG tube out.  Tolerating full liquid diet. -Currently treating conservatively, trying to avoid surgery during this admission given recent Covid diagnosis, recent steroid use and new onset A. Fib.  CAD -Stable -Plan to resume home statin and beta blocker once taking orals. Aspirin stopped due to need for coagulation.  Hypertension -Blood pressure well controlled to mildly elevated.  Home medications including amlodipine 5 mg and bisoprolol-hydrochlorothiazide 2.5-6.25 mg, irbesartan 300 mg currently on hold.  Resume home medications when taking oral medications and renal function stable.  AKI -Unknown baseline.  Serum creatinine 1.71 on presentation, improved at 1.21 today. -Continue to monitor daily labs.  COVID-19 infection -Management per primary team   For questions or updates, please contact [place] Please consult [email or web address] for contact info under        Signed, Daune Perch, NP  02/18/2019, 10:42 AM    Personally seen and examined. Agree with above.   Much better rate control Clears drinking No CP  GEN: Well nourished, well developed, in no acute distress  HEENT: normal  Neck: no JVD, carotid bruits, or masses Cardiac: IRRR; no murmurs, rubs, or gallops,no edema  Respiratory:  clear to auscultation bilaterally, normal work of breathing GI: soft, nontender, nondistended, + BS MS: no deformity or atrophy  Skin: warm and dry, no rash Neuro:  Alert and Oriented x 3, Strength and  sensation are intact Psych: euthymic mood, full affect  AFIB  - better control  - IV meds  - Change to PO when able.   Candee Furbish, MD

## 2019-02-18 NOTE — Progress Notes (Signed)
Patient ID: Derek Foster, male   DOB: [birth year], 84 y.o.   MRN: [id number]  PROGRESS NOTE    Derek Foster  [id number] DOB: 1935/07/24 DOA: 02/11/2019 PCP: Celene Squibb, MD   Brief Narrative:  84 year old male with history of CAD status post stent in 2011, hypertension, hyperlipidemia, GERD, hypothyroidism presented with abdominal pain, nausea, vomiting with generalized weakness since his recent Covid diagnosis on 02-13-20 (patient's wife passed away from COVID-79 2 weeks ago). In the ED, he was noted to be in A. fib with RVR with rates in 140s to 160s and small bowel obstruction.  Cardiology and general surgery were consulted.  Treated with amiodarone drip/Cardizem drip/IV metoprolol and digoxin.  SBO was treated with NG tube and bowel rest.  Assessment & Plan:   New onset paroxysmal atrial fibrillation/flutter with rapid ventricular response -Currently rate controlled.  Cardiology following.  Currently on amiodarone and Cardizem drips.  Also on IV metoprolol and digoxin.  Cardiology is planning to switch IV meds to oral once patient is tolerating diet.  Continue heparin and switch to oral anticoagulation once able. -2D echo showed preserved EF -TSH/T4 consistent with hyperthyroidism.  Will need to start methimazole low-dose once able to tolerate orally  Small bowel obstruction Recurrent ventral incisional hernia -Likely secondary to adhesions from prior surgeries and ventral hernia -General surgery following.  Currently has NG tube which was clamped on 02/16/2019.  Status post NG tube removal on 02/17/2019 -Diet advancement as per general surgery  Hypokalemia  -Replace.  Repeat a.m. labs.  Acute kidney injury -Likely secondary to GI losses.  improving.  Continue IV fluids.  Thrombocytopenia -Questionable cause.  Resolved.  No signs of bleeding  Leukocytosis -Most likely reactive.  Labs pending today.  Recent COVID-19 infection -Patient reports being diagnosed on 2023-02-13 at his  PCP Dr. Josue Hector office completed 3-week isolation period on 1/21, repeat Covid PCR was positive on 1/22, suspect this is residual viral fragments, I called Dr. Josue Hector office and was able to confirm original positive Covid diagnosis on 02-13-23 -Did have generalized weakness secondary to this without respiratory symptoms  History of CAD -Remote history of stents -Stable  Hypertension -Currently on Cardizem drip and IV metoprolol.  BP stable  GERD -IV PPI  Generalized deconditioning -Prognosis is guarded.  PT eval.   DVT prophylaxis: Heparin drip Code Status: DNR Family Communication: Spoke to patient at bedside.  Called daughter/[name] on phone on 02/17/2019 but she did not pick up Disposition Plan: We will remain inpatient for now.  Still on IV Cardizem and amiodarone drips and diet has not been advanced yet. Consultants: Cardiology/general surgery  Procedures: None  Antimicrobials:  None  Subjective: Patient seen and examined at bedside.  Poor historian.  Denies worsening abdominal pain.  No bowel movement yet.  Passing flatus.  No overnight fever or vomiting.  Objective: Vitals:   02/17/19 2008 02/18/19 0000 02/18/19 0343 02/18/19 0400  BP: (!) 154/60 (!) 141/72 (!) 121/59 127/60  Pulse: (!) 110 (!) 106 79 96  Resp: 16 16 18    Temp: 98.4 F (36.9 C) 99 F (37.2 C) 97.8 F (36.6 C)   TempSrc: Oral Oral Oral   SpO2: 95% 93% 94% 90%  Weight:      Height:        Intake/Output Summary (Last 24 hours) at 02/18/2019 0740 Last data filed at 02/18/2019 0555 Gross per 24 hour  Intake 2711.99 ml  Output 1150 ml  Net 1561.99 ml   Filed  Weights   02/11/19 1832  Weight: 128.4 kg    Examination:  General exam: No acute distress.  Poor historian.  Looks chronically ill. ENT: NG tube has already been removed. Respiratory system: Bilateral decreased breath sounds at bases with some scattered crackles  cardiovascular system: S1-S2 heard, currently rate  controlled gastrointestinal system: Abdomen is slightly distended, soft and mildly tender.  Bowel sounds are present.  Left-sided ventral hernia present  extremities: No clubbing or cyanosis; trace bilateral lower extremity edema present Central nervous system: Alert and oriented.  No focal neurological deficits. Moving extremities.  Poor historian Skin: No rashes or ulcers Psychiatry: Flat affect    Data Reviewed: I have personally reviewed following labs and imaging studies  CBC: Recent Labs  Lab 02/11/19 1836 02/12/19 0544 02/13/19 1108 02/14/19 0730 02/15/19 0504 02/16/19 0257 02/17/19 0648  WBC 12.9*   < > 7.4 10.2 15.3* 12.4* 15.0*  NEUTROABS 10.1*  --   --   --   --   --  11.9*  HGB 13.8   < > 10.7* 12.3* 13.0 11.6* 12.9*  HCT 42.5   < > 33.9* 38.2* 38.9* 34.4* 38.5*  MCV 92.6   < > 93.1 91.4 88.2 88.9 89.3  PLT 192   < > 131* 143* 158 128* 161   < > = values in this interval not displayed.   Basic Metabolic Panel: Recent Labs  Lab 02/13/19 0322 02/13/19 1108 02/14/19 0744 02/15/19 0504 02/16/19 0257 02/17/19 0648  NA 142  --  140 138 134* 134*  K 3.5  --  3.1* 3.3* 3.2* 3.2*  CL 102  --  101 96* 97* 94*  CO2 30  --  29 28 27 25   GLUCOSE 124*  --  111* 130* 104* 98  BUN 27*  --  20 20 21 17   CREATININE 1.54*  --  1.39* 1.54* 1.49* 1.40*  CALCIUM 8.2*  --  8.2* 8.3* 8.0* 8.1*  MG  --  1.9  --   --   --  1.7   GFR: Estimated Creatinine Clearance: 57.1 mL/min (A) (by C-G formula based on SCr of 1.4 mg/dL (H)). Liver Function Tests: Recent Labs  Lab 02/11/19 1836 02/12/19 0544  AST 19 15  ALT 22 20  ALKPHOS 78 69  BILITOT 0.7 0.8  PROT 6.8 6.1*  ALBUMIN 3.4* 3.0*   No results for input(s): LIPASE, AMYLASE in the last 168 hours. No results for input(s): AMMONIA in the last 168 hours. Coagulation Profile: No results for input(s): INR, PROTIME in the last 168 hours. Cardiac Enzymes: No results for input(s): CKTOTAL, CKMB, CKMBINDEX, TROPONINI in the  last 168 hours. BNP (last 3 results) No results for input(s): PROBNP in the last 8760 hours. HbA1C: No results for input(s): HGBA1C in the last 72 hours. CBG: No results for input(s): GLUCAP in the last 168 hours. Lipid Profile: No results for input(s): CHOL, HDL, LDLCALC, TRIG, CHOLHDL, LDLDIRECT in the last 72 hours. Thyroid Function Tests: No results for input(s): TSH, T4TOTAL, FREET4, T3FREE, THYROIDAB in the last 72 hours. Anemia Panel: No results for input(s): VITAMINB12, FOLATE, FERRITIN, TIBC, IRON, RETICCTPCT in the last 72 hours. Sepsis Labs: No results for input(s): PROCALCITON, LATICACIDVEN in the last 168 hours.  Recent Results (from the past 240 hour(s))  Respiratory Panel by RT PCR (Flu A&B, Covid) - Nasopharyngeal Swab     Status: Abnormal   Collection Time: 02/11/19  7:10 PM   Specimen: Nasopharyngeal Swab  Result Value Ref  Range Status   SARS Coronavirus 2 by RT PCR POSITIVE (A) NEGATIVE Final    Comment: CRITICAL RESULT CALLED TO, READ BACK BY AND VERIFIED WITH: ANDREWS,L. AT 2018 ON 02/11/2019 BY EVA (NOTE) SARS-CoV-2 target nucleic acids are DETECTED. SARS-CoV-2 RNA is generally detectable in upper respiratory specimens  during the acute phase of infection. Positive results are indicative of the presence of the identified virus, but do not rule out bacterial infection or co-infection with other pathogens not detected by the test. Clinical correlation with patient history and other diagnostic information is necessary to determine patient infection status. The expected result is Negative. Fact Sheet for Patients:  PinkCheek.be Fact Sheet for Healthcare Providers: GravelBags.it This test is not yet approved or cleared by the [place] FDA and  has been authorized for detection and/or diagnosis of SARS-CoV-2 by FDA under an Emergency Use Authorization (EUA).  This EUA will remain in effect (meaning  this test c an be used) for the duration of  the COVID-19 declaration under Section 564(b)(1) of the Act, 21 U.S.C. section 360bbb-3(b)(1), unless the authorization is terminated or revoked sooner.    Influenza A by PCR NEGATIVE NEGATIVE Final   Influenza B by PCR NEGATIVE NEGATIVE Final    Comment: (NOTE) The Xpert Xpress SARS-CoV-2/FLU/RSV assay is intended as an aid in  the diagnosis of influenza from Nasopharyngeal swab specimens and  should not be used as a sole basis for treatment. Nasal washings and  aspirates are unacceptable for Xpert Xpress SARS-CoV-2/FLU/RSV  testing. Fact Sheet for Patients: PinkCheek.be Fact Sheet for Healthcare Providers: GravelBags.it This test is not yet approved or cleared by the [place] FDA and  has been authorized for detection and/or diagnosis of SARS-CoV-2 by  FDA under an Emergency Use Authorization (EUA). This EUA will remain  in effect (meaning this test can be used) for the duration of the  Covid-19 declaration under Section 564(b)(1) of the Act, 21  U.S.C. section 360bbb-3(b)(1), unless the authorization is  terminated or revoked. Performed at [facility], [address], [place], [place] [address]          Radiology Studies: No results found.      Scheduled Meds:  digoxin  0.125 mg Intravenous Daily   feeding supplement  1 Container Oral TID BM   guaiFENesin  15 mL Per Tube Q6H   metoprolol tartrate  10 mg Intravenous Q4H   multivitamin with minerals  1 tablet Oral Daily   pantoprazole (PROTONIX) IV  40 mg Intravenous Q12H   Continuous Infusions:  sodium chloride 50 mL/hr at 02/17/19 2005   amiodarone 30 mg/hr (02/18/19 0459)   diltiazem (CARDIZEM) infusion 15 mg/hr (02/18/19 0005)   heparin 1,250 Units/hr (02/18/19 0233)   magnesium sulfate bolus IVPB Stopped (02/17/19 1145)          Aline August, MD [facility] 02/18/2019, 7:40  AM

## 2019-02-18 NOTE — NC FL2 (Signed)
[place] LEVEL OF CARE SCREENING TOOL     IDENTIFICATION  Patient Name: Derek Foster Birthdate: 27-Jun-1935 Sex: male Admission Date (Current Location): 02/11/2019  [facility] and [place] Number:  [employer] and Address:  [facility] [place], [place] [address] [place], [place] [address]      Provider Number: [id number]  Attending Physician Name and Address:  Aline August, MD  Relative Name and Phone Number:       Current Level of Care: SNF Recommended Level of Care: [place] Prior Approval Number:    Date Approved/Denied:   PASRR Number: [id number]  Discharge Plan: SNF    Current Diagnoses: Patient Active Problem List   Diagnosis Date Noted  . AKI (acute kidney injury) ([place]) 02/12/2019  . SBO (small bowel obstruction) ([place]) 02/12/2019  . CAD (coronary artery disease) 02/11/2019  . Atrial fibrillation with RVR ([place]) 02/11/2019  . Mixed hyperlipidemia 09/10/2009  . EDEMA 02/27/2009  . HYPOTHYROIDISM 02/26/2009  . Essential hypertension 02/26/2009  . MYOCARDIAL INFARCTION 02/26/2009    Orientation RESPIRATION BLADDER Height & Weight     Self, Time, Place, Situation  Normal Continent Weight: 283 lb (128.4 kg) Height:  6\' 1"  (185.4 cm)  BEHAVIORAL SYMPTOMS/MOOD NEUROLOGICAL BOWEL NUTRITION STATUS      Continent Diet(See d/c summary)  AMBULATORY STATUS COMMUNICATION OF NEEDS Skin   Limited Assist Verbally Normal                       Personal Care Assistance Level of Assistance  Bathing, Dressing, Feeding Bathing Assistance: Limited assistance Feeding assistance: Independent Dressing Assistance: Limited assistance     Functional Limitations Info  Sight Sight Info: Impaired        SPECIAL CARE FACTORS FREQUENCY  PT (By licensed PT), OT (By licensed OT)     PT Frequency: 5x a week OT Frequency: 5x a week            Contractures Contractures Info: Not present    Additional Factors Info   Code Status, Allergies, Isolation Precautions Code Status Info: DNR Allergies Info: Indomethacin, Iodine-131     Isolation Precautions Info: COIVD+     Current Medications (02/18/2019):  This is the current hospital active medication list Current Facility-Administered Medications  Medication Dose Route Frequency Provider Last Rate Last Admin  . 0.9 %  sodium chloride infusion   Intravenous Continuous Domenic Polite, MD 50 mL/hr at 02/18/19 1510 New Bag at 02/18/19 1510  . acetaminophen (TYLENOL) tablet 650 mg  650 mg Oral Q6H PRN Elgergawy, Silver Huguenin, MD   650 mg at 02/15/19 0126   Or  . acetaminophen (TYLENOL) suppository 650 mg  650 mg Rectal Q6H PRN Elgergawy, Silver Huguenin, MD      . amiodarone (NEXTERONE PREMIX) 360-4.14 MG/200ML-% (1.8 mg/mL) IV infusion  30 mg/hr Intravenous Continuous Kathie Dike, MD 16.67 mL/hr at 02/18/19 1513 30 mg/hr at 02/18/19 1513  . digoxin (LANOXIN) 0.25 MG/ML injection 0.125 mg  0.125 mg Intravenous Daily Jerline Pain, MD   0.125 mg at 02/18/19 0913  . diltiazem (CARDIZEM) 125 mg in dextrose 5% 125 mL (1 mg/mL) infusion  15 mg/hr Intravenous Continuous Domenic Polite, MD 15 mL/hr at 02/18/19 0005 15 mg/hr at 02/18/19 0005  . docusate sodium (COLACE) capsule 100 mg  100 mg Oral BID Jillyn Ledger, PA-C   100 mg at 02/18/19 0913  . feeding supplement (BOOST / RESOURCE BREEZE) liquid 1 Container  1 Container Oral BID BM Alekh, Kshitiz, MD      . feeding supplement (ENSURE ENLIVE) (ENSURE ENLIVE) liquid 237 mL  237 mL Oral BID BM Alekh, Kshitiz, MD      . guaiFENesin (ROBITUSSIN) 100 MG/5ML solution 300 mg  15 mL Per Tube Q6H Domenic Polite, MD   300 mg at 02/18/19 0503  . guaiFENesin-dextromethorphan (ROBITUSSIN DM) 100-10 MG/5ML syrup 15 mL  15 mL Per Tube Q4H PRN Lovey Newcomer T, NP   15 mL at 02/15/19 0336  . heparin ADULT infusion 100 units/mL (25000 units/284mL sodium chloride 0.45%)  1,250 Units/hr Intravenous Continuous Lyndee Leo, RPH 12.5  mL/hr at 02/18/19 0233 1,250 Units/hr at 02/18/19 0233  . magnesium sulfate IVPB 2 g 50 mL  2 g Intravenous Once Aline August, MD   Stopped at 02/17/19 1145  . menthol-cetylpyridinium (CEPACOL) lozenge 3 mg  1 lozenge Oral PRN Maczis, Barth Kirks, PA-C      . metoprolol tartrate (LOPRESSOR) injection 10 mg  10 mg Intravenous Q4H Jerline Pain, MD   10 mg at 02/18/19 1503  . multivitamin with minerals tablet 1 tablet  1 tablet Oral Daily Aline August, MD   1 tablet at 02/18/19 0913  . ondansetron (ZOFRAN) injection 4 mg  4 mg Intravenous Q6H PRN Elgergawy, Silver Huguenin, MD   4 mg at 02/16/19 0612  . pantoprazole (PROTONIX) injection 40 mg  40 mg Intravenous Q12H Domenic Polite, MD   40 mg at 02/18/19 0913  . phenol (CHLORASEPTIC) mouth spray 1 spray  1 spray Mouth/Throat PRN Domenic Polite, MD      . polyethylene glycol (MIRALAX / GLYCOLAX) packet 17 g  17 g Oral Daily PRN Jillyn Ledger, PA-C         Discharge Medications: Please see discharge summary for a list of discharge medications.  Relevant Imaging Results:  Relevant Lab Results:   Additional Information SSN [id number]  Casimir Barcellos J Riddick, LCSW

## 2019-02-18 NOTE — Progress Notes (Signed)
ANTICOAGULATION CONSULT NOTE - Follow Up Consult  Pharmacy Consult for Heparin Indication: atrial fibrillation  Allergies  Allergen Reactions  . Indomethacin Anaphylaxis    But tolerates ibuprofen, aleve  . Iodine-131 Anaphylaxis    Patient Measurements: Height: 6\' 1"  (185.4 cm) Weight: 283 lb (128.4 kg) IBW/kg (Calculated) : 79.9 Heparin Dosing Weight: 108.4 kg  Vital Signs: Temp: 98.5 F (36.9 C) (01/29 0855) Temp Source: Oral (01/29 0855) BP: 127/60 (01/29 0400) Pulse Rate: 96 (01/29 0400)  Labs: Recent Labs    02/16/19 0257 02/16/19 0257 02/16/19 1934 02/17/19 0648 02/18/19 0832  HGB 11.6*   < >  --  12.9* 11.7*  HCT 34.4*  --   --  38.5* 34.8*  PLT 128*  --   --  161 134*  HEPARINUNFRC  --   --  0.10* 0.38 0.65  CREATININE 1.49*  --   --  1.40* 1.21   < > = values in this interval not displayed.    Estimated Creatinine Clearance: 66.1 mL/min (by C-G formula based on SCr of 1.21 mg/dL).   Assessment: Heparin for afib with RVR (new diagnosis) while has bowel obstruction. No Ac PTA. IV heparin stopped 1/26 for copious brown/ bloody NG output.  Resume heparin 1/27.  NG tube removed - CHAd2 Vasc score 4   Heparin level therapeutic, CBC stable  Goal of Therapy:  Heparin level 0.3-0.7 units/ml Monitor platelets by anticoagulation protocol: Yes   Plan:  Continue heparin at 1250 units / hr Follow up AM labs  Thank you Anette Guarneri, PharmD [email or web address] 02/18/2019,9:15 AM

## 2019-02-18 NOTE — TOC Initial Note (Signed)
Transition of Care [facility]) - Initial/Assessment Note    Patient Details  Name: Derek Foster MRN: [id number] Date of Birth: 1935-01-23  Transition of [facility]) CM/SW Contact:    Arvella Merles, LCSW Phone Number: 02/18/2019, 3:35 PM  Clinical Narrative:                 CSW received consult for possible SNF placement at time of discharge. CSW spoke with patient regarding PT recommendation of SNF placement at time of discharge. Patient reported that he is okay with going to rehab if that is being recommended. Patient expressed understanding of PT recommendation and is agreeable to SNF placement at time of discharge, but would like to speak with his family first. CSW asked patient if there is anyone he would like contacted and he stated "no". SW discussed insurance authorization process and provided PT with the names of facilities currently taking COVID+ patients & Medicare SNF ratings list. CSW informed PT he would be contacted prior to moving forward with SNF referrals and once he has had a chance to speak with his family. No further questions reported at this time. CSW to continue to follow and assist with discharge planning needs.   Expected Discharge Plan: Skilled Nursing Facility Barriers to Discharge: Continued Medical Work up   Patient Goals and CMS Choice Patient states their goals for this hospitalization and ongoing recovery are:: To get strong CMS Medicare.gov Compare Post Acute Care list provided to:: Patient Choice offered to / list presented to : Patient  Expected Discharge Plan and Services Expected Discharge Plan: [place] In-house Referral: Clinical Social Work     Living arrangements for the past 2 months: [place]                                      Prior Living Arrangements/Services Living arrangements for the past 2 months: Single Family Home Lives with:: Self Patient language and need for interpreter reviewed:: Yes Do you  feel safe going back to the place where you live?: Yes      Need for Family Participation in Patient Care: No (Comment) Care giver support system in place?: Yes (comment)   Criminal Activity/Legal Involvement Pertinent to Current Situation/Hospitalization: No - Comment as needed  Activities of Daily Living Home Assistive Devices/Equipment: Gilford Rile (specify type) ADL Screening (condition at time of admission) Patient's cognitive ability adequate to safely complete daily activities?: Yes Is the patient deaf or have difficulty hearing?: No Does the patient have difficulty seeing, even when wearing glasses/contacts?: No Does the patient have difficulty concentrating, remembering, or making decisions?: No Patient able to express need for assistance with ADLs?: Yes Does the patient have difficulty dressing or bathing?: Yes Independently performs ADLs?: Yes (appropriate for developmental age) Does the patient have difficulty walking or climbing stairs?: Yes Weakness of Legs: Both Weakness of Arms/Hands: None  Permission Sought/Granted Permission sought to share information with : Facility Art therapist granted to share information with : Yes, Verbal Permission Granted     Permission granted to share info w AGENCY: SNFs        Emotional Assessment   Attitude/Demeanor/Rapport: Unable to Assess Affect (typically observed): Unable to Assess Orientation: : Oriented to Self, Oriented to Place, Oriented to  Time, Oriented to Situation Alcohol / Substance Use: Not Applicable Psych Involvement: No (comment)  Admission diagnosis:  Pain [R52] Atrial fibrillation with RVR (  [place]) [I48.91] Patient Active Problem List   Diagnosis Date Noted  . AKI (acute kidney injury) ([place]) 02/12/2019  . SBO (small bowel obstruction) ([place]) 02/12/2019  . CAD (coronary artery disease) 02/11/2019  . Atrial fibrillation with RVR ([place]) 02/11/2019  . Mixed hyperlipidemia 09/10/2009  . EDEMA  02/27/2009  . HYPOTHYROIDISM 02/26/2009  . Essential hypertension 02/26/2009  . MYOCARDIAL INFARCTION 02/26/2009   PCP:  Celene Squibb, MD Pharmacy:   [facility] [place] [id number] [place] [id number] Phone: [phone] Fax: [phone]  [facility] ([place]) [place], [place] [address] Phone: [phone] Fax: [phone]     Social Determinants of Health (SDOH) Interventions    Readmission Risk Interventions No flowsheet data found.

## 2019-02-18 NOTE — Progress Notes (Signed)
Nutrition Follow-up   RD working remotely.  DOCUMENTATION CODES:   Obesity unspecified  INTERVENTION:  Provide Boost Breeze po BID, each supplement provides 250 kcal and 9 grams of protein  Provide Ensure Enlive po BID, each supplement provides 350 kcal and 20 grams of protein.  Encourage adequate PO intake.   NUTRITION DIAGNOSIS:   Increased nutrient needs related to acute illness(COVID-19) as evidenced by estimated needs; ongoing  GOAL:   Patient will meet greater than or equal to 90% of their needs; progressing  MONITOR:   PO intake, Supplement acceptance, Diet advancement, Labs, Weight trends, Skin, I & O's  REASON FOR ASSESSMENT:   NPO/Clear Liquid Diet    ASSESSMENT:   Derek Foster  is a 84 y.o. male, with past medical history of CAD, status post stent in 2011, hypertension, hyperlipidemia, GERD, hypothyroidism, patient presents with complaints of abdominal pain, nausea, vomiting for last 24 hours, patient report overall generalized weakness, since his recent Covid diagnosis 12/30,(patient's wife passed away from COVID-19 2 weeks ago), patient denies any chest pain, fever, chills, hemoptysis, diarrhea, as well patient reports he has generalized weakness since his COVID-19 diagnosis, has been mainly using his wheelchair, as well he reports poor appetite, did not require admission or any treatment for his COVID-19.- in ED patient was noted to be in A. fib with RVR.  1/23- NGT placed for deocmpression due to SBO 1/25- NGT clamped 1/28- NGT removed, advanced to clear liquid diet   Pt able to tolerate his po diet. Diet has been advanced to a soft diet today. Meal completion 30% at lunch today. Pt currently has Boost Breeze ordered and has been consuming most of them. RD to additionally order Ensure shakes to aid in increased caloric and protein needs.   Labs and medications reviewed.   Diet Order:   Diet Order            DIET SOFT Room service appropriate? Yes; Fluid  consistency: Thin  Diet effective now              EDUCATION NEEDS:   No education needs have been identified at this time  Skin:  Skin Assessment: Reviewed RN Assessment  Last BM:  1/22  Height:   Ht Readings from Last 1 Encounters:  02/11/19 6\' 1"  (1.854 m)    Weight:   Wt Readings from Last 1 Encounters:  02/11/19 128.4 kg    Ideal Body Weight:  83.6 kg  BMI:  Body mass index is 37.34 kg/m.  Estimated Nutritional Needs:   Kcal:  [id number]  Protein:  110-125 grams  Fluid:  > 2.3 L    Corrin Parker, MS, RD, LDN Pager # [phone] After hours/ weekend pager # [phone]

## 2019-02-19 LAB — CBC
HCT: 31.7 % — ABNORMAL LOW (ref 39.0–52.0)
Hemoglobin: 11 g/dL — ABNORMAL LOW (ref 13.0–17.0)
MCH: 30 pg (ref 26.0–34.0)
MCHC: 34.7 g/dL (ref 30.0–36.0)
MCV: 86.4 fL (ref 80.0–100.0)
Platelets: 135 10*3/uL — ABNORMAL LOW (ref 150–400)
RBC: 3.67 MIL/uL — ABNORMAL LOW (ref 4.22–5.81)
RDW: 13.6 % (ref 11.5–15.5)
WBC: 7.7 10*3/uL (ref 4.0–10.5)
nRBC: 0 % (ref 0.0–0.2)

## 2019-02-19 LAB — HEPARIN LEVEL (UNFRACTIONATED): Heparin Unfractionated: 0.6 IU/mL (ref 0.30–0.70)

## 2019-02-19 LAB — BASIC METABOLIC PANEL
Anion gap: 9 (ref 5–15)
BUN: 13 mg/dL (ref 8–23)
CO2: 24 mmol/L (ref 22–32)
Calcium: 7.5 mg/dL — ABNORMAL LOW (ref 8.9–10.3)
Chloride: 99 mmol/L (ref 98–111)
Creatinine, Ser: 1.11 mg/dL (ref 0.61–1.24)
GFR calc Af Amer: >60 mL/min (ref >60)
GFR calc non Af Amer: >60 mL/min (ref >60)
Glucose, Bld: 113 mg/dL — ABNORMAL HIGH (ref 70–99)
Potassium: 3.4 mmol/L — ABNORMAL LOW (ref 3.5–5.1)
Sodium: 132 mmol/L — ABNORMAL LOW (ref 135–145)

## 2019-02-19 MED ORDER — POTASSIUM CHLORIDE CRYS ER 20 MEQ PO TBCR
40.0000 meq | EXTENDED_RELEASE_TABLET | Freq: Once | ORAL | Status: AC
  Administered 2019-02-19: 40 meq via ORAL
  Filled 2019-02-19: qty 2

## 2019-02-19 NOTE — Evaluation (Signed)
Occupational Therapy Evaluation Patient Details Name: Derek Foster MRN: [id number] DOB: [birth year] Today's Date: 02/19/2019    History of Present Illness 84 year old male with history of CAD status post stent in 2011, hypertension, hyperlipidemia, GERD, hypothyroidism presented with abdominal pain, nausea, vomiting with generalized weakness since his recent Covid diagnosis on 2020/01/24 (patient's wife passed away from COVID-19 2 weeks ago).In the ED, he was noted to be in A. fib with RVR with rates in 140s to 160s and small bowel obstruction.  Cardiology and general surgery were consulted.   Clinical Impression   This 84 y/o male presents with the above. PTA pt residing at home and performing ADL and functional mobility independently. Pt currently requiring minA for functional transfers using RW; completing seated UB ADL with setup/minguard assist and requiring up to maxA for LB ADL. Pt overall presenting with weakness, decreased activity tolerance and poor standing balance. Pt with fluctuating HR up to mid-upper 130s with activity this session. Pt will benefit from continued acute OT services and given current deficits currently recommend follow up therapy services in SNF setting after discharge to maximize his safety and independence with ADL and mobility. Will follow.     Follow Up Recommendations  SNF;Supervision/Assistance - 24 hour    Equipment Recommendations  Other (comment)(defer to next venue)           Precautions / Restrictions Precautions Precautions: Fall Precaution Comments: watch HR Restrictions Weight Bearing Restrictions: No      Mobility Bed Mobility Overal bed mobility: Needs Assistance Bed Mobility: Supine to Sit     Supine to sit: Min assist;HOB elevated     General bed mobility comments: assist for trunk elevation, increased time/effort  Transfers Overall transfer level: Needs assistance Equipment used: Rolling walker (2 wheeled) Transfers: Sit  to/from [facility] Sit to Stand: Min assist;From elevated surface Stand pivot transfers: Min assist       General transfer comment: assist for lifting initially, then minguard for balance    Balance Overall balance assessment: Needs assistance Sitting-balance support: Feet supported Sitting balance-Leahy Scale: Good     Standing balance support: Bilateral upper extremity supported Standing balance-Leahy Scale: Poor Standing balance comment: UE support for balance due to weakness                           ADL either performed or assessed with clinical judgement   ADL Overall ADL's : Needs assistance/impaired Eating/Feeding: Modified independent;Sitting   Grooming: Set up;Min guard;Sitting   Upper Body Bathing: Set up;Min guard;Sitting   Lower Body Bathing: Moderate assistance;Sit to/from stand   Upper Body Dressing : Set up;Min guard;Sitting   Lower Body Dressing: Moderate assistance;Maximal assistance;Sit to/from stand   Toilet Transfer: Minimal assistance;Stand-pivot;Cueing for safety;RW Toilet Transfer Details (indicate cue type and reason): simulated via transfer to [place] and Hygiene: Moderate assistance;Sit to/from stand       Functional mobility during ADLs: Minimal assistance;Rolling walker General ADL Comments: pt with weakness, decreased standing balance, also with limitations due to fluctuating HR with activity (up to mid-upper 130s)                         Pertinent Vitals/Pain Pain Assessment: No/denies pain     Hand Dominance     Extremity/Trunk Assessment Upper Extremity Assessment Upper Extremity Assessment: Generalized weakness   Lower Extremity Assessment Lower Extremity Assessment: Defer to PT evaluation   Cervical /  Trunk Assessment Cervical / Trunk Assessment: Kyphotic   Communication Communication Communication: HOH   Cognition Arousal/Alertness:  Awake/alert Behavior During Therapy: Flat affect Overall Cognitive Status: Within Functional Limits for tasks assessed                                 General Comments: for basic functional tasks, not formally assessed   General Comments  HR up to mid-upper 130s intermittently with activity, mid 90s when at rest ; spO2 >90% on RA    Exercises     Shoulder Instructions      Home Living Family/patient expects to be discharged to:: Skilled nursing facility Living Arrangements: Alone Available Help at Discharge: Available PRN/intermittently;Family Type of Home: House Home Access: Stairs to enter [employer] of Steps: 1 also has a ramp   Home Layout: Laundry or work area in basement;Able to live on main level with bedroom/bathroom         Biochemist, clinical: [place]: Environmental consultant - 2 wheels;Wheelchair - manual;Tub bench;Grab bars - tub/shower;Grab bars - toilet   Additional Comments: was caring for his wife prior to her recent death due to Covid      Prior Functioning/Environment Level of Independence: Independent                 OT Problem List: Decreased strength;Decreased range of motion;Decreased activity tolerance;Impaired balance (sitting and/or standing);Decreased knowledge of use of DME or AE;Cardiopulmonary status limiting activity;Obesity      OT Treatment/Interventions: Self-care/ADL training;Therapeutic exercise;Energy conservation;DME and/or AE instruction;Therapeutic activities;Patient/family education;Balance training    OT Goals(Current goals can be found in the care plan section) Acute Rehab OT Goals Patient Stated Goal: to get stronger OT Goal Formulation: With patient Time For Goal Achievement: 03/05/19 Potential to Achieve Goals: Good  OT Frequency: Min 2X/week   Barriers to D/C:            Co-evaluation              AM-PAC OT "6 Clicks" Daily Activity     Outcome Measure Help from another person  eating meals?: None Help from another person taking care of personal grooming?: A Little Help from another person toileting, which includes using toliet, bedpan, or urinal?: A Lot Help from another person bathing (including washing, rinsing, drying)?: A Lot Help from another person to put on and taking off regular upper body clothing?: A Little Help from another person to put on and taking off regular lower body clothing?: A Lot 6 Click Score: 16   End of Session Equipment Utilized During Treatment: Gait belt;Rolling walker Nurse Communication: Mobility status  Activity Tolerance: Patient tolerated treatment well Patient left: in chair;with call bell/phone within reach;with chair alarm set  OT Visit Diagnosis: Unsteadiness on feet (R26.81);Muscle weakness (generalized) (M62.81)                Time: 1135-1200 OT Time Calculation (min): 25 min Charges:  OT General Charges $OT Visit: 1 Visit OT Evaluation $OT Eval Moderate Complexity: 1 Mod OT Treatments $Self Care/Home Management : 8-22 mins  Lou Cal, OT Supplemental Rehabilitation Services Pager [phone] Office [phone]   Raymondo Band 02/19/2019, 2:02 PM

## 2019-02-19 NOTE — Progress Notes (Signed)
Patient ID: Derek Foster, male   DOB: [birth year], 84 y.o.   MRN: [id number]  PROGRESS NOTE    Derek Foster  [id number] DOB: 1935-08-11 DOA: 02/11/2019 PCP: Derek Squibb, MD   Brief Narrative:  84 year old male with history of CAD status post stent in 2011, hypertension, hyperlipidemia, GERD, hypothyroidism presented with abdominal pain, nausea, vomiting with generalized weakness since his recent Covid diagnosis on 02-14-2020 (patient's wife passed away from COVID-46 2 weeks ago). In the ED, he was noted to be in A. fib with RVR with rates in 140s to 160s and small bowel obstruction.  Cardiology and general surgery were consulted.  Treated with amiodarone drip/Cardizem drip/IV metoprolol and digoxin.  SBO was treated with NG tube and bowel rest.  Assessment & Plan:   New onset paroxysmal atrial fibrillation/flutter with rapid ventricular response -Currently intermittently tachycardic.  Cardiology following.  Currently on amiodarone and Cardizem drips.  Also on IV metoprolol and digoxin.  Cardiology is planning to switch IV meds to oral once patient is tolerating diet.  Continue heparin and switch to oral anticoagulation once able. -2D echo showed preserved EF -TSH/T4 consistent with hyperthyroidism.  Will need to start methimazole low-dose once able to tolerate orally  Small bowel obstruction Recurrent ventral incisional hernia -Likely secondary to adhesions from prior surgeries and ventral hernia -General surgery is now following peripherally.  Status post NG tube removal on 02/09/2019.  Currently tolerating diet.  Had bowel movement yesterday.    Hypokalemia  -Replace.  Repeat a.m. labs.  Acute kidney injury -Likely secondary to GI losses.  improving.  Continue IV fluids.  Thrombocytopenia -Questionable cause.  Resolved.  No signs of bleeding  Leukocytosis -Most likely reactive.  Resolved  Recent COVID-19 infection -Patient reports being diagnosed on February 14, 2023 at his PCP Dr. Josue Foster office completed 3-week isolation period on 1/21, repeat Covid PCR was positive on 1/22, suspect this is residual viral fragments, I called Dr. Josue Foster office and was able to confirm original positive Covid diagnosis on 14-Feb-2023 -Did have generalized weakness secondary to this without respiratory symptoms  History of CAD -Remote history of stents -Stable  Hypertension -Currently on Cardizem drip and IV metoprolol.  BP stable  GERD -IV PPI  Generalized deconditioning -Prognosis is guarded.  PT recommends SNF placement.  Social worker consult.  DVT prophylaxis: Heparin drip Code Status: DNR Family Communication: Spoke to patient at bedside.  Called daughter/Derek Foster on phone on 02/17/2019 but she did not pick up Disposition Plan: We will remain inpatient for now.  Still on IV Cardizem and amiodarone drips and diet has not been advanced yet. Consultants: Cardiology/general surgery  Procedures: None  Antimicrobials:  None  Subjective: Patient seen and examined at bedside.  Poor historian.  No worsening abdominal pain.  Passing flatus.  Had bowel movement yesterday.  No fever or vomiting. Objective: Vitals:   02/18/19 1600 02/18/19 1936 02/19/19 0500 02/19/19 0717  BP: 138/60  120/75 (!) 146/81  Pulse: 87   (!) 104  Resp: 15   15  Temp:  98 F (36.7 C) 97.8 F (36.6 C)   TempSrc:  Oral Oral   SpO2: 95%   95%  Weight:      Height:        Intake/Output Summary (Last 24 hours) at 02/19/2019 0746 Last data filed at 02/19/2019 0734 Gross per 24 hour  Intake 2153.51 ml  Output 300 ml  Net 1853.51 ml   Filed Weights   02/11/19 1832  Weight:  128.4 kg    Examination:  General exam: No distress.  Poor historian.  Looks chronically ill. ENT: No elevated JVD Respiratory system: Bilateral decreased breath sounds at bases with scattered crackles.   Cardiovascular system: S1-S2 heard, intermittently tachycardic gastrointestinal system: Abdomen is slightly distended, soft  and mildly tender.  Bowel sounds are present.  Left-sided ventral hernia present  extremities: No cyanosis or clubbing; trace bilateral lower extremity edema present Central nervous system: Alert and awake.  No focal neurological deficits. Moving extremities.  Poor historian Skin: No ulcers or rashes Psychiatry: Has flat affect   Data Reviewed: I have personally reviewed following labs and imaging studies  CBC: Recent Labs  Lab 02/15/19 0504 02/16/19 0257 02/17/19 0648 02/18/19 0832 02/19/19 0500  WBC 15.3* 12.4* 15.0* 7.5 7.7  NEUTROABS  --   --  11.9*  --   --   HGB 13.0 11.6* 12.9* 11.7* 11.0*  HCT 38.9* 34.4* 38.5* 34.8* 31.7*  MCV 88.2 88.9 89.3 88.3 86.4  PLT 158 128* 161 134* [id number]*   Basic Metabolic Panel: Recent Labs  Lab 02/13/19 1108 02/14/19 0744 02/15/19 0504 02/16/19 0257 02/17/19 0648 02/18/19 0832 02/19/19 0500  NA  --    < > 138 134* 134* 134* 132*  K  --    < > 3.3* 3.2* 3.2* 3.4* 3.4*  CL  --    < > 96* 97* 94* 100 99  CO2  --    < > 28 27 25 26 24   GLUCOSE  --    < > 130* 104* 98 118* 113*  BUN  --    < > 20 21 17 14 13   CREATININE  --    < > 1.54* 1.49* 1.40* 1.21 1.11  CALCIUM  --    < > 8.3* 8.0* 8.1* 7.8* 7.5*  MG 1.9  --   --   --  1.7  --   --    < > = values in this interval not displayed.   GFR: Estimated Creatinine Clearance: 72.1 mL/min (by C-G formula based on SCr of 1.11 mg/dL). Liver Function Tests: No results for input(s): AST, ALT, ALKPHOS, BILITOT, PROT, ALBUMIN in the last 168 hours. No results for input(s): LIPASE, AMYLASE in the last 168 hours. No results for input(s): AMMONIA in the last 168 hours. Coagulation Profile: No results for input(s): INR, PROTIME in the last 168 hours. Cardiac Enzymes: No results for input(s): CKTOTAL, CKMB, CKMBINDEX, TROPONINI in the last 168 hours. BNP (last 3 results) No results for input(s): PROBNP in the last 8760 hours. HbA1C: No results for input(s): HGBA1C in the last 72  hours. CBG: No results for input(s): GLUCAP in the last 168 hours. Lipid Profile: No results for input(s): CHOL, HDL, LDLCALC, TRIG, CHOLHDL, LDLDIRECT in the last 72 hours. Thyroid Function Tests: No results for input(s): TSH, T4TOTAL, FREET4, T3FREE, THYROIDAB in the last 72 hours. Anemia Panel: No results for input(s): VITAMINB12, FOLATE, FERRITIN, TIBC, IRON, RETICCTPCT in the last 72 hours. Sepsis Labs: No results for input(s): PROCALCITON, LATICACIDVEN in the last 168 hours.  Recent Results (from the past 240 hour(s))  Respiratory Panel by RT PCR (Flu A&B, Covid) - Nasopharyngeal Swab     Status: Abnormal   Collection Time: 02/11/19  7:10 PM   Specimen: Nasopharyngeal Swab  Result Value Ref Range Status   SARS Coronavirus 2 by RT PCR POSITIVE (A) NEGATIVE Final    Comment: CRITICAL RESULT CALLED TO, READ BACK BY AND VERIFIED WITH: ANDREWS,L.  AT 2018 ON 02/11/2019 BY EVA (NOTE) SARS-CoV-2 target nucleic acids are DETECTED. SARS-CoV-2 RNA is generally detectable in upper respiratory specimens  during the acute phase of infection. Positive results are indicative of the presence of the identified virus, but do not rule out bacterial infection or co-infection with other pathogens not detected by the test. Clinical correlation with patient history and other diagnostic information is necessary to determine patient infection status. The expected result is Negative. Fact Sheet for Patients:  PinkCheek.be Fact Sheet for Healthcare Providers: GravelBags.it This test is not yet approved or cleared by the [place] FDA and  has been authorized for detection and/or diagnosis of SARS-CoV-2 by FDA under an Emergency Use Authorization (EUA).  This EUA will remain in effect (meaning this test c an be used) for the duration of  the COVID-19 declaration under Section 564(b)(1) of the Act, 21 U.S.C. section 360bbb-3(b)(1), unless the  authorization is terminated or revoked sooner.    Influenza A by PCR NEGATIVE NEGATIVE Final   Influenza B by PCR NEGATIVE NEGATIVE Final    Comment: (NOTE) The Xpert Xpress SARS-CoV-2/FLU/RSV assay is intended as an aid in  the diagnosis of influenza from Nasopharyngeal swab specimens and  should not be used as a sole basis for treatment. Nasal washings and  aspirates are unacceptable for Xpert Xpress SARS-CoV-2/FLU/RSV  testing. Fact Sheet for Patients: PinkCheek.be Fact Sheet for Healthcare Providers: GravelBags.it This test is not yet approved or cleared by the [place] FDA and  has been authorized for detection and/or diagnosis of SARS-CoV-2 by  FDA under an Emergency Use Authorization (EUA). This EUA will remain  in effect (meaning this test can be used) for the duration of the  Covid-19 declaration under Section 564(b)(1) of the Act, 21  U.S.C. section 360bbb-3(b)(1), unless the authorization is  terminated or revoked. Performed at [facility], [address] [place], [place] [address]          Radiology Studies: No results found.      Scheduled Meds:  digoxin  0.125 mg Intravenous Daily   docusate sodium  100 mg Oral BID   feeding supplement  1 Container Oral BID BM   feeding supplement (ENSURE ENLIVE)  237 mL Oral BID BM   guaiFENesin  15 mL Per Tube Q6H   metoprolol tartrate  10 mg Intravenous Q4H   multivitamin with minerals  1 tablet Oral Daily   pantoprazole (PROTONIX) IV  40 mg Intravenous Q12H   Continuous Infusions:  sodium chloride 50 mL/hr at 02/18/19 1510   amiodarone 30 mg/hr (02/19/19 0006)   diltiazem (CARDIZEM) infusion 15 mg/hr (02/18/19 2351)   heparin 1,250 Units/hr (02/18/19 2351)   magnesium sulfate bolus IVPB Stopped (02/17/19 1145)          Aline August, MD [facility] 02/19/2019, 7:46 AM

## 2019-02-19 NOTE — Progress Notes (Signed)
Progress Note  Patient Name: Derek Foster Date of Encounter: 02/19/2019  Primary Cardiologist: Skeet Latch, MD   Subjective   No CP, no SOB. Tolerating some PO  Inpatient Medications    Scheduled Meds: . digoxin  0.125 mg Intravenous Daily  . docusate sodium  100 mg Oral BID  . feeding supplement  1 Container Oral BID BM  . feeding supplement (ENSURE ENLIVE)  237 mL Oral BID BM  . guaiFENesin  15 mL Per Tube Q6H  . metoprolol tartrate  10 mg Intravenous Q4H  . multivitamin with minerals  1 tablet Oral Daily  . pantoprazole (PROTONIX) IV  40 mg Intravenous Q12H   Continuous Infusions: . amiodarone 30 mg/hr (02/19/19 0006)  . diltiazem (CARDIZEM) infusion 15 mg/hr (02/18/19 2351)  . heparin 1,250 Units/hr (02/18/19 2351)  . magnesium sulfate bolus IVPB Stopped (02/17/19 1145)   PRN Meds: acetaminophen **OR** acetaminophen, guaiFENesin-dextromethorphan, menthol-cetylpyridinium, ondansetron (ZOFRAN) IV, phenol, polyethylene glycol   Vital Signs    Vitals:   02/19/19 0500 02/19/19 0717 02/19/19 0800 02/19/19 0823  BP: 120/75 (!) 146/81 (!) 134/54   Pulse:  (!) 104 85 99  Resp:  15 17 19   Temp: 97.8 F (36.6 C)  98.5 F (36.9 C)   TempSrc: Oral  Oral   SpO2:  95% 95% 95%  Weight:      Height:        Intake/Output Summary (Last 24 hours) at 02/19/2019 1129 Last data filed at 02/19/2019 0848 Gross per 24 hour  Intake 2256.22 ml  Output --  Net 2256.22 ml   Last 3 Weights 02/11/2019 06/16/2012 02/25/2011  Weight (lbs) 283 lb 282 lb 272 lb  Weight (kg) 128.368 kg 127.914 kg 123.378 kg      Telemetry    Atrial fibrillation in the 80s-100- Personally Reviewed  ECG    No new tracings for review  Physical Exam   GEN: Well nourished, well developed, in no acute distress  HEENT: normal  Neck: no JVD, carotid bruits, or masses Cardiac: IRRR; no murmurs, rubs, or gallops,no edema  Respiratory:  clear to auscultation bilaterally, normal work of breathing GI:  soft, nontender, nondistended, + BS MS: no deformity or atrophy  Skin: warm and dry, no rash Neuro:  Alert and Oriented x 3, Strength and sensation are intact Psych: euthymic mood, full affect   Labs    High Sensitivity Troponin:   Recent Labs  Lab 02/11/19 1836 02/11/19 2045  TROPONINIHS 10 10      Chemistry Recent Labs  Lab 02/17/19 0648 02/18/19 0832 02/19/19 0500  NA 134* 134* 132*  K 3.2* 3.4* 3.4*  CL 94* 100 99  CO2 25 26 24   GLUCOSE 98 118* 113*  BUN 17 14 13   CREATININE 1.40* 1.21 1.11  CALCIUM 8.1* 7.8* 7.5*  GFRNONAA 46* 55* >60  GFRAA 54* >60 >60  ANIONGAP 15 8 9      Hematology Recent Labs  Lab 02/17/19 0648 02/18/19 0832 02/19/19 0500  WBC 15.0* 7.5 7.7  RBC 4.31 3.94* 3.67*  HGB 12.9* 11.7* 11.0*  HCT 38.5* 34.8* 31.7*  MCV 89.3 88.3 86.4  MCH 29.9 29.7 30.0  MCHC 33.5 33.6 34.7  RDW 13.3 13.7 13.6  PLT 161 134* 135*    BNPNo results for input(s): BNP, PROBNP in the last 168 hours.   DDimer No results for input(s): DDIMER in the last 168 hours.   Radiology    No results found.  Cardiac Studies   Echocardiogram  02/13/2019: Impressions: 1. Left ventricular ejection fraction, by visual estimation, is 60 to 65%. The left ventricle has normal function. There is moderately increased left ventricular hypertrophy. 2. Definity contrast agent was given IV to delineate the left ventricular endocardial borders. 3. Left ventricular diastolic parameters are indeterminate. 4. The left ventricle has no regional wall motion abnormalities. 5. Global right ventricle has normal systolic function.The right ventricular size is normal. Right vetricular wall thickness was not assessed. 6. Left atrial size was severely dilated. 7. Right atrial size was normal. 8. The mitral valve is grossly normal. No evidence of mitral valve regurgitation. 9. The tricuspid valve is grossly normal. 10. The tricuspid valve is grossly normal. Tricuspid valve  regurgitation is mild. 11. The aortic valve is tricuspid. Aortic valve regurgitation is not visualized. No evidence of aortic valve sclerosis or stenosis. 12. The pulmonic valve was grossly normal. Pulmonic valve regurgitation is not visualized. 13. Mildly elevated pulmonary artery systolic pressure. 14. The interatrial septum was not well visualized.  Patient Profile     84 y.o. male with a history of CAD s.p DES to LAD in 01/2009 with medical management recommended for RCA disease, hypertension, hyperlipidemia, hypothyroidism, who is being seen for new onset atrial fibrillation in the setting of COVID-19 infection and recurrent small bowel obstruction.  Assessment & Plan    Atrial fibrillation, new onset -Echo showed LVEF of 60-65% with severely dilated left atrium. -TSH was low at 0.026.  Free T4 was high at 1.76.  Free T3 normal.  methimazole when able PO -Patient continues on IV amiodarone, digoxin 0.125 mg, diltiazem 15 mg/h and metoprolol 10 mg IV every 4 hours. -Continues in A. fib with rates now mostly in the 80-100s.  Much better controlled. -CHA2DS2/VAS Stroke Risk Score is at least 4 (CAD, HTN, Age (2)).  On IV heparin for anticoagulation with plan to switch to oral Eliquis once taking oral medications. Eliquis dose would be 5 mg twice daily. -Plan to switch rate control medications to oral dosing when okay with surgery. Likely amiodarone 400 mg daily and metoprolol 100 mg BID and stop hop bisoprolol. Will likely need cardizem and oral dig as well  Small bowel obstruction/recurrent ventral incisional hernia -Patient has been evaluated by general surgery.  NG tube out.  Tolerating full liquid diet. -Currently treating conservatively, trying to avoid surgery during this admission given recent Covid diagnosis, recent steroid use and new onset A. Fib. --slowly improving.   CAD -Stable -Plan to resume home statin and beta blocker once taking orals. Aspirin stopped due to need for  anticoagulation.  Hypertension -Blood pressure well controlled to mildly elevated.  Monitor  AKI -Unknown baseline.  Serum creatinine 1.71 on presentation, improved. -Continue to monitor daily labs.  COVID-19 infection -Management per primary team  Let's make sure diet is tolerated before changing to PO.    For questions or updates, please contact [place] Please consult [email or web address] for contact info under        Signed, Candee Furbish, MD  02/19/2019, 11:29 AM

## 2019-02-19 NOTE — Progress Notes (Signed)
Pt refusing Robitussin, saying it tastes awful and he doesn't want it.

## 2019-02-19 NOTE — Progress Notes (Signed)
Patient refusing ensure and boost as they are "nasty."

## 2019-02-19 NOTE — Progress Notes (Signed)
ANTICOAGULATION CONSULT NOTE - Follow Up Consult  Pharmacy Consult for Heparin Indication: atrial fibrillation  Allergies  Allergen Reactions  . Indomethacin Anaphylaxis    But tolerates ibuprofen, aleve  . Iodine-131 Anaphylaxis    Patient Measurements: Height: 6\' 1"  (185.4 cm) Weight: 283 lb (128.4 kg) IBW/kg (Calculated) : 79.9 Heparin Dosing Weight: 108.4 kg  Vital Signs: Temp: 98.5 F (36.9 C) (01/30 0800) Temp Source: Oral (01/30 0800) BP: 134/54 (01/30 0800) Pulse Rate: 85 (01/30 0800)  Labs: Recent Labs    02/17/19 0648 02/17/19 0648 02/18/19 0832 02/19/19 0500  HGB 12.9*   < > 11.7* 11.0*  HCT 38.5*  --  34.8* 31.7*  PLT 161  --  134* 135*  HEPARINUNFRC 0.38  --  0.65 0.60  CREATININE 1.40*  --  1.21 1.11   < > = values in this interval not displayed.    Estimated Creatinine Clearance: 72.1 mL/min (by C-G formula based on SCr of 1.11 mg/dL).   Assessment: Heparin for afib with RVR (new diagnosis) with concurrent bowel obstruction. No anticoagulation PTA. IV heparin stopped 1/26 for copious brown/ bloody NG output.  Resume heparin 1/27. NG tube removed.  - CHAd2 Vasc score 4   Heparin level therapeutic at 0.60, Hgb trending down 11.7>11. Plt low but stable at 135. No bleeding per the RN.    Goal of Therapy:  Heparin level 0.3-0.7 units/ml Monitor platelets by anticoagulation protocol: Yes   Plan:  Continue heparin at 1250 units / hr Follow heparin level and CBC daily Monitor for s/sx of bleeding  Agnes Lawrence, PharmD PGY1 Pharmacy Resident

## 2019-02-20 LAB — BASIC METABOLIC PANEL
Anion gap: 9 (ref 5–15)
BUN: 9 mg/dL (ref 8–23)
CO2: 24 mmol/L (ref 22–32)
Calcium: 7.7 mg/dL — ABNORMAL LOW (ref 8.9–10.3)
Chloride: 102 mmol/L (ref 98–111)
Creatinine, Ser: 1.19 mg/dL (ref 0.61–1.24)
GFR calc Af Amer: >60 mL/min (ref >60)
GFR calc non Af Amer: 57 mL/min — ABNORMAL LOW (ref >60)
Glucose, Bld: 118 mg/dL — ABNORMAL HIGH (ref 70–99)
Potassium: 3.4 mmol/L — ABNORMAL LOW (ref 3.5–5.1)
Sodium: 135 mmol/L (ref 135–145)

## 2019-02-20 LAB — CBC
HCT: 31.5 % — ABNORMAL LOW (ref 39.0–52.0)
Hemoglobin: 11 g/dL — ABNORMAL LOW (ref 13.0–17.0)
MCH: 30 pg (ref 26.0–34.0)
MCHC: 34.9 g/dL (ref 30.0–36.0)
MCV: 85.8 fL (ref 80.0–100.0)
Platelets: 138 10*3/uL — ABNORMAL LOW (ref 150–400)
RBC: 3.67 MIL/uL — ABNORMAL LOW (ref 4.22–5.81)
RDW: 13.7 % (ref 11.5–15.5)
WBC: 7.9 10*3/uL (ref 4.0–10.5)
nRBC: 0 % (ref 0.0–0.2)

## 2019-02-20 LAB — HEPARIN LEVEL (UNFRACTIONATED)
Heparin Unfractionated: 0.31 IU/mL (ref 0.30–0.70)
Heparin Unfractionated: 0.49 IU/mL (ref 0.30–0.70)

## 2019-02-20 LAB — MAGNESIUM: Magnesium: 1.8 mg/dL (ref 1.7–2.4)

## 2019-02-20 MED ORDER — APIXABAN 5 MG PO TABS
5.0000 mg | ORAL_TABLET | Freq: Two times a day (BID) | ORAL | Status: DC
  Administered 2019-02-20 – 2019-02-27 (×15): 5 mg via ORAL
  Filled 2019-02-20 (×15): qty 1

## 2019-02-20 MED ORDER — DIGOXIN 125 MCG PO TABS
0.1250 mg | ORAL_TABLET | Freq: Every day | ORAL | Status: DC
  Administered 2019-02-20 – 2019-02-22 (×3): 0.125 mg via ORAL
  Filled 2019-02-20 (×3): qty 1

## 2019-02-20 MED ORDER — POTASSIUM CHLORIDE CRYS ER 20 MEQ PO TBCR
40.0000 meq | EXTENDED_RELEASE_TABLET | Freq: Once | ORAL | Status: AC
  Administered 2019-02-20: 40 meq via ORAL
  Filled 2019-02-20: qty 2

## 2019-02-20 MED ORDER — DILTIAZEM HCL ER COATED BEADS 180 MG PO CP24
360.0000 mg | ORAL_CAPSULE | Freq: Every day | ORAL | Status: DC
  Administered 2019-02-20 – 2019-02-25 (×6): 360 mg via ORAL
  Filled 2019-02-20 (×6): qty 2

## 2019-02-20 MED ORDER — METOPROLOL TARTRATE 50 MG PO TABS
50.0000 mg | ORAL_TABLET | Freq: Two times a day (BID) | ORAL | Status: DC
  Administered 2019-02-20 – 2019-02-21 (×3): 50 mg via ORAL
  Filled 2019-02-20 (×3): qty 1

## 2019-02-20 MED ORDER — METHIMAZOLE 5 MG PO TABS
5.0000 mg | ORAL_TABLET | Freq: Two times a day (BID) | ORAL | Status: DC
  Administered 2019-02-20 – 2019-02-23 (×8): 5 mg via ORAL
  Filled 2019-02-20 (×9): qty 1

## 2019-02-20 MED ORDER — AMIODARONE HCL 200 MG PO TABS
200.0000 mg | ORAL_TABLET | Freq: Two times a day (BID) | ORAL | Status: DC
  Administered 2019-02-20 – 2019-03-12 (×41): 200 mg via ORAL
  Filled 2019-02-20 (×41): qty 1

## 2019-02-20 NOTE — Progress Notes (Signed)
Patient's heart rate elevated to 130's while eating lunch. Back to 90's-100's after rest.   Hiram Comber, RN 02/20/2019 2:00 PM

## 2019-02-20 NOTE — Progress Notes (Signed)
Patient ID: Derek Foster, male   DOB: 07-17-1935, 84 y.o.   MRN: [id number]  PROGRESS NOTE    Derek Foster  [id number] DOB: June 26, 1935 DOA: 02/11/2019 PCP: Celene Squibb, MD   Brief Narrative:  84 year old male with history of CAD status post stent in 2011, hypertension, hyperlipidemia, GERD, hypothyroidism presented with abdominal pain, nausea, vomiting with generalized weakness since his recent Covid diagnosis on Feb 17, 2020 (patient's wife passed away from COVID-16 2 weeks ago). In the ED, he Foster noted to be in A. fib with RVR with rates in 140s to 160s and small bowel obstruction.  Cardiology and general surgery were consulted.  Treated with amiodarone drip/Cardizem drip/IV metoprolol and digoxin.  SBO Foster treated with NG tube and bowel rest.  Assessment & Plan:   New onset paroxysmal atrial fibrillation/flutter with rapid ventricular response -Currently intermittently tachycardic.  Cardiology following.  Currently on amiodarone and Cardizem drips.  Also on IV metoprolol and digoxin.  Cardiology is planning to switch IV meds to oral.  Spoke to Dr. Skains/cardiology and with the patient today.  Patient is currently tolerating diet.  Currently on heparin, probable plans to switch to oral Eliquis today. -2D echo showed preserved EF -TSH/T4 consistent with hyperthyroidism.  Will need to start methimazole low-dose once able to tolerate orally  Small bowel obstruction Recurrent ventral incisional hernia -Likely secondary to adhesions from prior surgeries and ventral hernia -General surgery is now following peripherally.  Status post NG tube removal on 02/09/2019.  Currently tolerating diet.  Had bowel movement yesterday as well.    Hypokalemia  -Replace.  Repeat a.m. labs.  Acute kidney injury -Likely secondary to GI losses.  Improved.  IV fluids discontinued on 02/19/2019  Thrombocytopenia -Questionable cause. No signs of bleeding  Leukocytosis -Most likely reactive.  Resolved  Recent  COVID-19 infection -Patient reports being diagnosed on February 17, 2023 at his PCP Dr. Josue Hector office completed 3-week isolation period on 1/21, repeat Covid PCR Foster positive on 1/22, suspect this is residual viral fragments, I called Dr. Josue Hector office and Foster able to confirm original positive Covid diagnosis on February 17, 2023 -Did have generalized weakness secondary to this without respiratory symptoms  History of CAD -Remote history of stents -Stable  Hypertension -Currently on Cardizem drip and IV metoprolol.  BP stable  GERD -Switch IV PPI to oral.  Generalized deconditioning -Prognosis is guarded.  PT recommends SNF placement.  Social worker consult.  DVT prophylaxis: Heparin drip Code Status: DNR Family Communication: Spoke to patient at bedside.   Disposition Plan: We will remain inpatient for now.  Still on IV Cardizem and amiodarone drips and diet has not been advanced yet. Consultants: Cardiology/general surgery  Procedures: None  Antimicrobials:  None  Subjective: Patient seen and examined at bedside.  Poor historian.  Denies worsening shortness of breath, abdominal pain, nausea or vomiting.  Had bowel movement yesterday as well. Objective: Vitals:   02/19/19 2225 02/20/19 0003 02/20/19 0433 02/20/19 0606  BP:  123/67 (!) 145/81   Pulse: 99 95 (!) 123 (!) 104  Resp: 20 19 18 19   Temp:  98.3 F (36.8 C) 98.8 F (37.1 C)   TempSrc:  Oral Oral   SpO2: 94% 94% 95% 93%  Weight:   126.8 kg   Height:        Intake/Output Summary (Last 24 hours) at 02/20/2019 0743 Last data filed at 02/20/2019 0439 Gross per 24 hour  Intake 837.98 ml  Output 2650 ml  Net -1812.02 ml  Filed Weights   02/11/19 1832 02/20/19 0433  Weight: 128.4 kg 126.8 kg    Examination:  General exam: No acute distress.  Poor historian.  Looks chronically ill. ENT: JVD is not elevated Respiratory system: Bilateral decreased breath sounds at bases with some crackles.  No wheezing  cardiovascular  system: Tachycardic, S1-S2 heard gastrointestinal system: Abdomen is slightly distended, soft and nontender.  Bowel sounds are present.  Left-sided ventral hernia present  extremities: No clubbing or cyanosis; trace bilateral lower extremity edema present Central nervous system: Awake and alert.  Moving extremities.  Poor historian skin: No rashes or ulcers Psychiatry: Extremely flat affect   Data Reviewed: I have personally reviewed following labs and imaging studies  CBC: Recent Labs  Lab 02/16/19 0257 02/17/19 0648 02/18/19 0832 02/19/19 0500 02/20/19 0237  WBC 12.4* 15.0* 7.5 7.7 7.9  NEUTROABS  --  11.9*  --   --   --   HGB 11.6* 12.9* 11.7* 11.0* 11.0*  HCT 34.4* 38.5* 34.8* 31.7* 31.5*  MCV 88.9 89.3 88.3 86.4 85.8  PLT 128* 161 134* 135* 0000000*   Basic Metabolic Panel: Recent Labs  Lab 02/13/19 1108 02/14/19 0744 02/16/19 0257 02/17/19 0648 02/18/19 0832 02/19/19 0500 02/20/19 0237  NA  --    < > 134* 134* 134* 132* 135  K  --    < > 3.2* 3.2* 3.4* 3.4* 3.4*  CL  --    < > 97* 94* 100 99 102  CO2  --    < > 27 25 26 24 24   GLUCOSE  --    < > 104* 98 118* 113* 118*  BUN  --    < > 21 17 14 13 9   CREATININE  --    < > 1.49* 1.40* 1.21 1.11 1.19  CALCIUM  --    < > 8.0* 8.1* 7.8* 7.5* 7.7*  MG 1.9  --   --  1.7  --   --  1.8   < > = values in this interval not displayed.   GFR: Estimated Creatinine Clearance: 66.8 mL/min (by C-G formula based on SCr of 1.19 mg/dL). Liver Function Tests: No results for input(s): AST, ALT, ALKPHOS, BILITOT, PROT, ALBUMIN in the last 168 hours. No results for input(s): LIPASE, AMYLASE in the last 168 hours. No results for input(s): AMMONIA in the last 168 hours. Coagulation Profile: No results for input(s): INR, PROTIME in the last 168 hours. Cardiac Enzymes: No results for input(s): CKTOTAL, CKMB, CKMBINDEX, TROPONINI in the last 168 hours. BNP (last 3 results) No results for input(s): PROBNP in the last 8760  hours. HbA1C: No results for input(s): HGBA1C in the last 72 hours. CBG: No results for input(s): GLUCAP in the last 168 hours. Lipid Profile: No results for input(s): CHOL, HDL, LDLCALC, TRIG, CHOLHDL, LDLDIRECT in the last 72 hours. Thyroid Function Tests: No results for input(s): TSH, T4TOTAL, FREET4, T3FREE, THYROIDAB in the last 72 hours. Anemia Panel: No results for input(s): VITAMINB12, FOLATE, FERRITIN, TIBC, IRON, RETICCTPCT in the last 72 hours. Sepsis Labs: No results for input(s): PROCALCITON, LATICACIDVEN in the last 168 hours.  Recent Results (from the past 240 hour(s))  Respiratory Panel by RT PCR (Flu A&B, Covid) - Nasopharyngeal Swab     Status: Abnormal   Collection Time: 02/11/19  7:10 PM   Specimen: Nasopharyngeal Swab  Result Value Ref Range Status   SARS Coronavirus 2 by RT PCR POSITIVE (A) NEGATIVE Final    Comment: CRITICAL RESULT CALLED  TO, READ BACK BY AND VERIFIED WITH: ANDREWS,L. AT 2018 ON 02/11/2019 BY EVA (NOTE) SARS-CoV-2 target nucleic acids are DETECTED. SARS-CoV-2 RNA is generally detectable in upper respiratory specimens  during the acute phase of infection. Positive results are indicative of the presence of the identified virus, but do not rule out bacterial infection or co-infection with other pathogens not detected by the test. Clinical correlation with patient history and other diagnostic information is necessary to determine patient infection status. The expected result is Negative. Fact Sheet for Patients:  PinkCheek.be Fact Sheet for Healthcare Providers: GravelBags.it This test is not yet approved or cleared by the [place] FDA and  has been authorized for detection and/or diagnosis of SARS-CoV-2 by FDA under an Emergency Use Authorization (EUA).  This EUA will remain in effect (meaning this test c an be used) for the duration of  the COVID-19 declaration under Section  564(b)(1) of the Act, 21 U.S.C. section 360bbb-3(b)(1), unless the authorization is terminated or revoked sooner.    Influenza A by PCR NEGATIVE NEGATIVE Final   Influenza B by PCR NEGATIVE NEGATIVE Final    Comment: (NOTE) The Xpert Xpress SARS-CoV-2/FLU/RSV assay is intended as an aid in  the diagnosis of influenza from Nasopharyngeal swab specimens and  should not be used as a sole basis for treatment. Nasal washings and  aspirates are unacceptable for Xpert Xpress SARS-CoV-2/FLU/RSV  testing. Fact Sheet for Patients: PinkCheek.be Fact Sheet for Healthcare Providers: GravelBags.it This test is not yet approved or cleared by the [place] FDA and  has been authorized for detection and/or diagnosis of SARS-CoV-2 by  FDA under an Emergency Use Authorization (EUA). This EUA will remain  in effect (meaning this test can be used) for the duration of the  Covid-19 declaration under Section 564(b)(1) of the Act, 21  U.S.C. section 360bbb-3(b)(1), unless the authorization is  terminated or revoked. Performed at [facility], [address] [place], [place] [address]          Radiology Studies: No results found.      Scheduled Meds: . digoxin  0.125 mg Intravenous Daily  . docusate sodium  100 mg Oral BID  . feeding supplement  1 Container Oral BID BM  . feeding supplement (ENSURE ENLIVE)  237 mL Oral BID BM  . guaiFENesin  15 mL Per Tube Q6H  . metoprolol tartrate  10 mg Intravenous Q4H  . multivitamin with minerals  1 tablet Oral Daily  . pantoprazole (PROTONIX) IV  40 mg Intravenous Q12H   Continuous Infusions: . amiodarone 30 mg/hr (02/19/19 0006)  . diltiazem (CARDIZEM) infusion 15 mg/hr (02/20/19 0459)  . heparin 1,250 Units/hr (02/19/19 2107)  . magnesium sulfate bolus IVPB Stopped (02/17/19 1145)          Aline August, MD [facility] 02/20/2019, 7:43 AM

## 2019-02-20 NOTE — Progress Notes (Signed)
Patient tolerating soft diet. Ambulated to chair x1 staff assistance. Instructed on use of incentive spirometer and flutter valve and educated on importance. Patient verbalized understanding.   Amiodarone, cardizem and heparin gtt stopped and oral agents administered. Will continue to monitor.  Hiram Comber, RN 02/20/2019 10:09 AM

## 2019-02-20 NOTE — Progress Notes (Signed)
Progress Note  Patient Name: Derek Foster Date of Encounter: 02/20/2019  Primary Cardiologist: Skeet Latch, MD   Subjective   Denies any symptoms tolerating food, bowel movement, discussed with Dr. Starla Link.  He feels well.  Discussed with him changing him to p.o. medications.  Inpatient Medications    Scheduled Meds: . amiodarone  200 mg Oral BID  . apixaban  5 mg Oral BID  . digoxin  0.125 mg Oral Daily  . diltiazem  360 mg Oral Daily  . docusate sodium  100 mg Oral BID  . feeding supplement  1 Container Oral BID BM  . feeding supplement (ENSURE ENLIVE)  237 mL Oral BID BM  . guaiFENesin  15 mL Per Tube Q6H  . metoprolol tartrate  10 mg Intravenous Q4H  . multivitamin with minerals  1 tablet Oral Daily  . pantoprazole (PROTONIX) IV  40 mg Intravenous Q12H   Continuous Infusions: . magnesium sulfate bolus IVPB Stopped (02/17/19 1145)   PRN Meds: acetaminophen **OR** acetaminophen, guaiFENesin-dextromethorphan, menthol-cetylpyridinium, ondansetron (ZOFRAN) IV, phenol, polyethylene glycol   Vital Signs    Vitals:   02/20/19 0003 02/20/19 0433 02/20/19 0606 02/20/19 0738  BP: 123/67 (!) 145/81  115/67  Pulse: 95 (!) 123 (!) 104 (!) 103  Resp: 19 18 19 20   Temp: 98.3 F (36.8 C) 98.8 F (37.1 C)    TempSrc: Oral Oral    SpO2: 94% 95% 93% 93%  Weight:  126.8 kg    Height:        Intake/Output Summary (Last 24 hours) at 02/20/2019 0916 Last data filed at 02/20/2019 0850 Gross per 24 hour  Intake 757.98 ml  Output 2650 ml  Net -1892.02 ml   Last 3 Weights 02/20/2019 02/11/2019 06/16/2012  Weight (lbs) 279 lb 8.7 oz 283 lb 282 lb  Weight (kg) 126.8 kg 128.368 kg 127.914 kg      Telemetry    Atrial fibrillation in the 80s-100 mostly- Personally Reviewed  ECG    No new tracings for review  Physical Exam   General: Pleasant Respiratory: Able to complete full sentences without difficulty   Labs    High Sensitivity Troponin:   Recent Labs  Lab  02/11/19 1836 02/11/19 2045  TROPONINIHS 10 10      Chemistry Recent Labs  Lab 02/18/19 0832 02/19/19 0500 02/20/19 0237  NA 134* 132* 135  K 3.4* 3.4* 3.4*  CL 100 99 102  CO2 26 24 24   GLUCOSE 118* 113* 118*  BUN 14 13 9   CREATININE 1.21 1.11 1.19  CALCIUM 7.8* 7.5* 7.7*  GFRNONAA 55* >60 57*  GFRAA >60 >60 >60  ANIONGAP 8 9 9      Hematology Recent Labs  Lab 02/18/19 0832 02/19/19 0500 02/20/19 0237  WBC 7.5 7.7 7.9  RBC 3.94* 3.67* 3.67*  HGB 11.7* 11.0* 11.0*  HCT 34.8* 31.7* 31.5*  MCV 88.3 86.4 85.8  MCH 29.7 30.0 30.0  MCHC 33.6 34.7 34.9  RDW 13.7 13.6 13.7  PLT 134* 135* 138*    BNPNo results for input(s): BNP, PROBNP in the last 168 hours.   DDimer No results for input(s): DDIMER in the last 168 hours.   Radiology    No results found.  Cardiac Studies   Echocardiogram 02/13/2019: Impressions: 1. Left ventricular ejection fraction, by visual estimation, is 60 to 65%. The left ventricle has normal function. There is moderately increased left ventricular hypertrophy. 2. Definity contrast agent was given IV to delineate the left ventricular endocardial  borders. 3. Left ventricular diastolic parameters are indeterminate. 4. The left ventricle has no regional wall motion abnormalities. 5. Global right ventricle has normal systolic function.The right ventricular size is normal. Right vetricular wall thickness was not assessed. 6. Left atrial size was severely dilated. 7. Right atrial size was normal. 8. The mitral valve is grossly normal. No evidence of mitral valve regurgitation. 9. The tricuspid valve is grossly normal. 10. The tricuspid valve is grossly normal. Tricuspid valve regurgitation is mild. 11. The aortic valve is tricuspid. Aortic valve regurgitation is not visualized. No evidence of aortic valve sclerosis or stenosis. 12. The pulmonic valve was grossly normal. Pulmonic valve regurgitation is not visualized. 13. Mildly elevated  pulmonary artery systolic pressure. 14. The interatrial septum was not well visualized.  Patient Profile     84 y.o. male with a history of CAD s.p DES to LAD in 01/2009 with medical management recommended for RCA disease, hypertension, hyperlipidemia, hypothyroidism, who is being seen for new onset atrial fibrillation in the setting of COVID-19 infection and recurrent small bowel obstruction.  Assessment & Plan    Atrial fibrillation, new onset -Echo showed LVEF of 60-65% with severely dilated left atrium.  -Patient continues on IV amiodarone, digoxin 0.125 mg, diltiazem 15 mg/h and metoprolol 10 mg IV every 4 hours. -Continues in A. fib with rates now mostly in the 80-100s.  Much better controlled. -CHA2DS2/VAS Stroke Risk Score is at least 4 (CAD, HTN, Age (2)).   -On 02/20/2019, since he is tolerating p.o.'s fairly well and having bowel movements showing bowel activation, we will change his IV medications to p.o. medications. -I have placed him on amiodarone 200 mg twice daily -Metoprolol tartrate 50 mg twice daily -Diltiazem CD 360 mg once a day -Digoxin 0.125 mg once a day -Eliquis 5 mg twice a day  Continue to monitor on telemetry.  Hyperthyroidism -I will defer to Dr. Starla Link for methimazole -TSH was low at 0.026.  Free T4 was high at 1.76.  Free T3 normal.  methimazole when able PO  Small bowel obstruction/recurrent ventral incisional hernia -Patient has been evaluated by general surgery.  NG tube out.  Tolerating full liquid diet. -Currently treating conservatively, trying to avoid surgery during this admission given recent Covid diagnosis, recent steroid use and new onset A. Fib. --slowly improving.  Now tolerating p.o., bowel movement  CAD -Stable -Plan to resume home statin and beta blocker once taking orals. Aspirin stopped due to need for anticoagulation.  No anginal symptoms  Hypertension -Blood pressure well controlled to mildly elevated.   Monitor  AKI -Unknown baseline.  Serum creatinine 1.71 on presentation, improved. -Continue to monitor daily labs.  COVID-19 infection -Management per primary team   For questions or updates, please contact [place] Please consult [email or web address] for contact info under        Signed, Candee Furbish, MD  02/20/2019, 9:16 AM

## 2019-02-21 LAB — BASIC METABOLIC PANEL
Anion gap: 6 (ref 5–15)
BUN: 9 mg/dL (ref 8–23)
CO2: 23 mmol/L (ref 22–32)
Calcium: 7.8 mg/dL — ABNORMAL LOW (ref 8.9–10.3)
Chloride: 106 mmol/L (ref 98–111)
Creatinine, Ser: 1.23 mg/dL (ref 0.61–1.24)
GFR calc Af Amer: >60 mL/min (ref >60)
GFR calc non Af Amer: 54 mL/min — ABNORMAL LOW (ref >60)
Glucose, Bld: 112 mg/dL — ABNORMAL HIGH (ref 70–99)
Potassium: 3.7 mmol/L (ref 3.5–5.1)
Sodium: 135 mmol/L (ref 135–145)

## 2019-02-21 LAB — CBC
HCT: 32.5 % — ABNORMAL LOW (ref 39.0–52.0)
Hemoglobin: 11.2 g/dL — ABNORMAL LOW (ref 13.0–17.0)
MCH: 29.8 pg (ref 26.0–34.0)
MCHC: 34.5 g/dL (ref 30.0–36.0)
MCV: 86.4 fL (ref 80.0–100.0)
Platelets: 150 10*3/uL (ref 150–400)
RBC: 3.76 MIL/uL — ABNORMAL LOW (ref 4.22–5.81)
RDW: 14.1 % (ref 11.5–15.5)
WBC: 8.4 10*3/uL (ref 4.0–10.5)
nRBC: 0 % (ref 0.0–0.2)

## 2019-02-21 LAB — HEPARIN LEVEL (UNFRACTIONATED): Heparin Unfractionated: >2.2 IU/mL — ABNORMAL HIGH (ref 0.30–0.70)

## 2019-02-21 LAB — MAGNESIUM: Magnesium: 1.9 mg/dL (ref 1.7–2.4)

## 2019-02-21 LAB — GLUCOSE, CAPILLARY: Glucose-Capillary: 134 mg/dL — ABNORMAL HIGH (ref 70–99)

## 2019-02-21 MED ORDER — METOPROLOL TARTRATE 100 MG PO TABS
100.0000 mg | ORAL_TABLET | Freq: Two times a day (BID) | ORAL | Status: DC
  Administered 2019-02-21 – 2019-02-23 (×4): 100 mg via ORAL
  Filled 2019-02-21 (×4): qty 1

## 2019-02-21 MED ORDER — PANTOPRAZOLE SODIUM 40 MG PO TBEC
40.0000 mg | DELAYED_RELEASE_TABLET | Freq: Two times a day (BID) | ORAL | Status: DC
  Administered 2019-02-21 – 2019-03-12 (×39): 40 mg via ORAL
  Filled 2019-02-21 (×39): qty 1

## 2019-02-21 NOTE — TOC Progression Note (Signed)
Transition of Care [facility]) - Progression Note    Patient Details  Name: Derek Foster MRN: [id number] Date of Birth: [birth year]  Transition of Care [facility]) CM/SW [place],  Phone Number: 02/21/2019, 10:11 AM  Clinical Narrative:    CSW spoke with patient. Patient agrees with SNF recommendation and chose [facility]. Patient declined having anyone he would like CSW to contact. CSW will continue to follow-up and assist with discharge planning needs.   Expected Discharge Plan: [place] Barriers to Discharge: Continued Medical Work up  Expected Discharge Plan and Services Expected Discharge Plan: [place] In-house Referral: Clinical Social Work     Living arrangements for the past 2 months: Single Family Home                                       Social Determinants of Health (SDOH) Interventions    Readmission Risk Interventions No flowsheet data found.

## 2019-02-21 NOTE — Progress Notes (Signed)
Occupational Therapy Treatment Patient Details Name: STOCKTON PULLIAN MRN: [id number] DOB: [birth year] Today's Date: 02/21/2019    History of present illness 84 year old male with history of CAD status post stent in 2011, hypertension, hyperlipidemia, GERD, hypothyroidism presented with abdominal pain, nausea, vomiting with generalized weakness since his recent Covid diagnosis on 02/02/20 (patient's wife passed away from COVID-65 2 weeks ago).In the ED, he was noted to be in A. fib with RVR with rates in 140s to 160s and small bowel obstruction.  Cardiology and general surgery were consulted.   OT comments  Pt making progress towards OT goals, though continues to have limitations due to elevated HR with minimal activity. Pt tolerating OOB to recliner and functional mobility a short distance using RW, overall requiring minA for functional transfers. HR fluctuating 128- max 171 bpm with standing activity. He continues to present with overall weakness and decreased endurance impacting his ability to perform functional tasks independently. Feel SNF recommendation remains appropriate at this time. Will continue to follow acutely.   Follow Up Recommendations  SNF;Supervision/Assistance - 24 hour    Equipment Recommendations  Other (comment)(defer to next venue)          Precautions / Restrictions Precautions Precautions: Fall Precaution Comments: watch HR Restrictions Weight Bearing Restrictions: No       Mobility Bed Mobility Overal bed mobility: Needs Assistance Bed Mobility: Supine to Sit     Supine to sit: Min assist;HOB elevated     General bed mobility comments: assist for trunk elevation, increased time/effort  Transfers Overall transfer level: Needs assistance Equipment used: Rolling walker (2 wheeled) Transfers: Sit to/from [facility] Sit to Stand: Min assist;From elevated surface Stand pivot transfers: Min assist       General transfer comment: minA  for balance upon standing to RW; VCs for maintaining closer proximity to RW    Balance Overall balance assessment: Needs assistance Sitting-balance support: Feet supported Sitting balance-Leahy Scale: Good     Standing balance support: Bilateral upper extremity supported Standing balance-Leahy Scale: Poor Standing balance comment: UE support for balance due to weakness                           ADL either performed or assessed with clinical judgement   ADL Overall ADL's : Needs assistance/impaired     Grooming: Wash/dry face;Set up;Min guard;Sitting Grooming Details (indicate cue type and reason): seated EOB                     Toileting- Clothing Manipulation and Hygiene: Maximal assistance;Sit to/from stand Toileting - Clothing Manipulation Details (indicate cue type and reason): pt with some residual BM upon standing from EOB, provided assist for pericare      Functional mobility during ADLs: Minimal assistance;Rolling walker;+2 for safety/equipment General ADL Comments: limited today mostly due to elevated HR with minimal activity                        Cognition Arousal/Alertness: Awake/alert Behavior During Therapy: WFL for tasks assessed/performed Overall Cognitive Status: Within Functional Limits for tasks assessed                                          Exercises Exercises: General Upper Extremity;General Lower Extremity;Other exercises General Exercises - Upper Extremity Shoulder Flexion: AROM;Both;10 reps Elbow Flexion:  AROM;Both;Seated Digit Composite Flexion: AROM;Left;10 reps;Seated Composite Extension: AROM;Left;10 reps;Seated General Exercises - Lower Extremity Ankle Circles/Pumps: AROM;Both;Seated Quad Sets: AROM;Both;Seated Hip Flexion/Marching: AROM;Both;Seated Other Exercises Other Exercises: use of IS and flutter valve x5 each, pt pulling to 1750   Shoulder Instructions       General Comments pt  with increased HR during session, fluctuating 128 to max 171 bpm with activity, O2 sats maintaining >92% on RA, BP 152/89 while seated in recliner end of session    Pertinent Vitals/ Pain       Pain Assessment: No/denies pain  Home Living                                          Prior Functioning/Environment              Frequency  Min 2X/week        Progress Toward Goals  OT Goals(current goals can now be found in the care plan section)  Progress towards OT goals: Progressing toward goals  Acute Rehab OT Goals Patient Stated Goal: to get stronger OT Goal Formulation: With patient Time For Goal Achievement: 03/05/19 Potential to Achieve Goals: Good  Plan Discharge plan remains appropriate    Co-evaluation    PT/OT/SLP Co-Evaluation/Treatment: Yes Reason for Co-Treatment: For patient/therapist safety;To address functional/ADL transfers   OT goals addressed during session: ADL's and self-care      AM-PAC OT "6 Clicks" Daily Activity     Outcome Measure   Help from another person eating meals?: None Help from another person taking care of personal grooming?: A Little Help from another person toileting, which includes using toliet, bedpan, or urinal?: A Lot Help from another person bathing (including washing, rinsing, drying)?: A Lot Help from another person to put on and taking off regular upper body clothing?: A Little Help from another person to put on and taking off regular lower body clothing?: A Lot 6 Click Score: 16    End of Session Equipment Utilized During Treatment: Gait belt;Rolling walker  OT Visit Diagnosis: Unsteadiness on feet (R26.81);Muscle weakness (generalized) (M62.81)   Activity Tolerance Patient tolerated treatment well   Patient Left in chair;with call bell/phone within reach;with chair alarm set   Nurse Communication Mobility status        Time: [id number] OT Time Calculation (min): 24 min  Charges: OT  General Charges $OT Visit: 1 Visit OT Treatments $Self Care/Home Management : 8-22 mins  Lou Cal, [place] Pager [phone] Office [place] 02/21/2019, 1:03 PM

## 2019-02-21 NOTE — Progress Notes (Signed)
Patient ID: Derek Foster, male   DOB: January 01, 1936, 84 y.o.   MRN: [id number]  PROGRESS NOTE    Derek Foster  [id number] DOB: 10-02-1935 DOA: 02/11/2019 PCP: Celene Squibb, MD   Brief Narrative:  84 year old male with history of CAD status post stent in 2011, hypertension, hyperlipidemia, GERD, hypothyroidism presented with abdominal pain, nausea, vomiting with generalized weakness since his recent Covid diagnosis on 2020-02-06 (patient's wife passed away from COVID-27 2 weeks ago). In the ED, he was noted to be in A. fib with RVR with rates in 140s to 160s and small bowel obstruction.  Cardiology and general surgery were consulted.  Treated with amiodarone drip/Cardizem drip/IV metoprolol and digoxin.  SBO was treated with NG tube and bowel rest.  Assessment & Plan:   New onset paroxysmal atrial fibrillation/flutter with rapid ventricular response -Currently still tachycardic.  Cardiology following.   -Cardiology switched to oral amiodarone, Cardizem, metoprolol, digoxin and Eliquis on 02/20/2019. -2D echo showed preserved EF  Hyperthyroidism -TSH/T4 consistent with hyperthyroidism.  Started methimazole 5 mg twice a day on 02/20/2019.  Outpatient follow-up with PCP/endocrinology  Small bowel obstruction Recurrent ventral incisional hernia -Likely secondary to adhesions from prior surgeries and ventral hernia -General surgery is now following peripherally.  Status post NG tube removal on 02/09/2019.  Currently tolerating diet and having bowel movements.  Hypokalemia  -Improving.  Acute kidney injury -Likely secondary to GI losses.  Improved.  IV fluids discontinued on 02/19/2019  Thrombocytopenia -Questionable cause.  Resolved.  No signs of bleeding  Leukocytosis -Most likely reactive.  Resolved  Recent COVID-19 infection -Patient reports being diagnosed on 2023/02/06 at his PCP Dr. Josue Hector office completed 3-week isolation period on 1/21, repeat Covid PCR was positive on 1/22,  suspect this is residual viral fragments, I called Dr. Josue Hector office and was able to confirm original positive Covid diagnosis on 2023/02/06 -Did have generalized weakness secondary to this without respiratory symptoms  History of CAD -Remote history of stents -Stable  Hypertension -Continue Cardizem and metoprolol.  BP stable  GERD -Switch IV PPI to oral.  Generalized deconditioning -Prognosis is guarded.  PT recommends SNF placement.  Social worker following.  DVT prophylaxis: Heparin drip Code Status: DNR Family Communication: Spoke to patient at bedside.   Disposition Plan: We will remain inpatient as patient is still tachycardic.  Probable discharge in 1 to 2 days if heart rate stabilizes and if cleared by cardiology.  Will need SNF placement.   Consultants: Cardiology/general surgery  Procedures: None  Antimicrobials:  None  Subjective: Patient seen and examined at bedside.  Poor historian.  Denies any chest pain, worsening shortness of breath.  No overnight fever, nausea or vomiting.  Nursing staff reports intermittent tachycardia.   Objective: Vitals:   02/20/19 2225 02/21/19 0000 02/21/19 0400 02/21/19 0655  BP: (!) 145/73 135/75 (!) 141/82   Pulse:  96 (!) 105   Resp: 19 20 16    Temp:  98.7 F (37.1 C)    TempSrc:  Oral    SpO2: 94% 94% 93%   Weight:    125.7 kg  Height:        Intake/Output Summary (Last 24 hours) at 02/21/2019 0738 Last data filed at 02/21/2019 [id number] Gross per 24 hour  Intake 640 ml  Output 1850 ml  Net -1210 ml   Filed Weights   02/11/19 1832 02/20/19 0433 02/21/19 0655  Weight: 128.4 kg 126.8 kg 125.7 kg    Examination:  General exam: No distress.  Poor historian.  Looks chronically ill. ENT: No elevated JVD Respiratory system: Bilateral decreased breath sounds at bases with scattered crackles  cardiovascular system: S1-S2 heard, tachycardic gastrointestinal system: Abdomen is slightly distended, soft and nontender.  Bowel sounds  present.  Left-sided ventral hernia present  extremities: No cyanosis or clubbing; trace bilateral lower extremity edema present Central nervous system: Alert and awake.  Moving extremities.  Poor historian skin: No ulcers or rashes Psychiatry: Flat affect  Data Reviewed: I have personally reviewed following labs and imaging studies  CBC: Recent Labs  Lab 02/17/19 0648 02/18/19 0832 02/19/19 0500 02/20/19 0237 02/21/19 0304  WBC 15.0* 7.5 7.7 7.9 8.4  NEUTROABS 11.9*  --   --   --   --   HGB 12.9* 11.7* 11.0* 11.0* 11.2*  HCT 38.5* 34.8* 31.7* 31.5* 32.5*  MCV 89.3 88.3 86.4 85.8 86.4  PLT 161 134* 135* 138* [id number]   Basic Metabolic Panel: Recent Labs  Lab 02/17/19 0648 02/18/19 0832 02/19/19 0500 02/20/19 0237 02/21/19 0304  NA 134* 134* 132* 135 135  K 3.2* 3.4* 3.4* 3.4* 3.7  CL 94* 100 99 102 106  CO2 25 26 24 24 23   GLUCOSE 98 118* 113* 118* 112*  BUN 17 14 13 9 9   CREATININE 1.40* 1.21 1.11 1.19 1.23  CALCIUM 8.1* 7.8* 7.5* 7.7* 7.8*  MG 1.7  --   --  1.8 1.9   GFR: Estimated Creatinine Clearance: 64.3 mL/min (by C-G formula based on SCr of 1.23 mg/dL). Liver Function Tests: No results for input(s): AST, ALT, ALKPHOS, BILITOT, PROT, ALBUMIN in the last 168 hours. No results for input(s): LIPASE, AMYLASE in the last 168 hours. No results for input(s): AMMONIA in the last 168 hours. Coagulation Profile: No results for input(s): INR, PROTIME in the last 168 hours. Cardiac Enzymes: No results for input(s): CKTOTAL, CKMB, CKMBINDEX, TROPONINI in the last 168 hours. BNP (last 3 results) No results for input(s): PROBNP in the last 8760 hours. HbA1C: No results for input(s): HGBA1C in the last 72 hours. CBG: No results for input(s): GLUCAP in the last 168 hours. Lipid Profile: No results for input(s): CHOL, HDL, LDLCALC, TRIG, CHOLHDL, LDLDIRECT in the last 72 hours. Thyroid Function Tests: No results for input(s): TSH, T4TOTAL, FREET4, T3FREE, THYROIDAB in the  last 72 hours. Anemia Panel: No results for input(s): VITAMINB12, FOLATE, FERRITIN, TIBC, IRON, RETICCTPCT in the last 72 hours. Sepsis Labs: No results for input(s): PROCALCITON, LATICACIDVEN in the last 168 hours.  Recent Results (from the past 240 hour(s))  Respiratory Panel by RT PCR (Flu A&B, Covid) - Nasopharyngeal Swab     Status: Abnormal   Collection Time: 02/11/19  7:10 PM   Specimen: Nasopharyngeal Swab  Result Value Ref Range Status   SARS Coronavirus 2 by RT PCR POSITIVE (A) NEGATIVE Final    Comment: CRITICAL RESULT CALLED TO, READ BACK BY AND VERIFIED WITH: ANDREWS,L. AT 2018 ON 02/11/2019 BY EVA (NOTE) SARS-CoV-2 target nucleic acids are DETECTED. SARS-CoV-2 RNA is generally detectable in upper respiratory specimens  during the acute phase of infection. Positive results are indicative of the presence of the identified virus, but do not rule out bacterial infection or co-infection with other pathogens not detected by the test. Clinical correlation with patient history and other diagnostic information is necessary to determine patient infection status. The expected result is Negative. Fact Sheet for Patients:  PinkCheek.be Fact Sheet for Healthcare Providers: GravelBags.it This test is not yet approved or cleared by the [place]  States FDA and  has been authorized for detection and/or diagnosis of SARS-CoV-2 by FDA under an Emergency Use Authorization (EUA).  This EUA will remain in effect (meaning this test c an be used) for the duration of  the COVID-19 declaration under Section 564(b)(1) of the Act, 21 U.S.C. section 360bbb-3(b)(1), unless the authorization is terminated or revoked sooner.    Influenza A by PCR NEGATIVE NEGATIVE Final   Influenza B by PCR NEGATIVE NEGATIVE Final    Comment: (NOTE) The Xpert Xpress SARS-CoV-2/FLU/RSV assay is intended as an aid in  the diagnosis of influenza from  Nasopharyngeal swab specimens and  should not be used as a sole basis for treatment. Nasal washings and  aspirates are unacceptable for Xpert Xpress SARS-CoV-2/FLU/RSV  testing. Fact Sheet for Patients: PinkCheek.be Fact Sheet for Healthcare Providers: GravelBags.it This test is not yet approved or cleared by the [place] FDA and  has been authorized for detection and/or diagnosis of SARS-CoV-2 by  FDA under an Emergency Use Authorization (EUA). This EUA will remain  in effect (meaning this test can be used) for the duration of the  Covid-19 declaration under Section 564(b)(1) of the Act, 21  U.S.C. section 360bbb-3(b)(1), unless the authorization is  terminated or revoked. Performed at [facility], [address], [place], [place] [address]          Radiology Studies: No results found.      Scheduled Meds: . amiodarone  200 mg Oral BID  . apixaban  5 mg Oral BID  . digoxin  0.125 mg Oral Daily  . diltiazem  360 mg Oral Daily  . docusate sodium  100 mg Oral BID  . feeding supplement  1 Container Oral BID BM  . feeding supplement (ENSURE ENLIVE)  237 mL Oral BID BM  . guaiFENesin  15 mL Per Tube Q6H  . methimazole  5 mg Oral BID  . metoprolol tartrate  50 mg Oral BID  . multivitamin with minerals  1 tablet Oral Daily  . pantoprazole (PROTONIX) IV  40 mg Intravenous Q12H   Continuous Infusions: . magnesium sulfate bolus IVPB Stopped (02/17/19 1145)          Aline August, MD [facility] 02/21/2019, 7:38 AM

## 2019-02-21 NOTE — Progress Notes (Signed)
Progress Note  Patient Name: Derek Foster Date of Encounter: 02/21/2019  Primary Cardiologist: Skeet Latch, MD   Subjective   Sitting comfortably in chair, HR 80-90s, but shot up to 160s per report when he tried to work with PT. He otherwise feels well.  Inpatient Medications    Scheduled Meds: . amiodarone  200 mg Oral BID  . apixaban  5 mg Oral BID  . digoxin  0.125 mg Oral Daily  . diltiazem  360 mg Oral Daily  . docusate sodium  100 mg Oral BID  . feeding supplement  1 Container Oral BID BM  . feeding supplement (ENSURE ENLIVE)  237 mL Oral BID BM  . guaiFENesin  15 mL Per Tube Q6H  . methimazole  5 mg Oral BID  . metoprolol tartrate  100 mg Oral BID  . multivitamin with minerals  1 tablet Oral Daily  . pantoprazole  40 mg Oral BID   Continuous Infusions: . magnesium sulfate bolus IVPB Stopped (02/17/19 1145)   PRN Meds: acetaminophen **OR** acetaminophen, guaiFENesin-dextromethorphan, menthol-cetylpyridinium, ondansetron (ZOFRAN) IV, phenol, polyethylene glycol   Vital Signs    Vitals:   02/21/19 0400 02/21/19 0655 02/21/19 0949 02/21/19 0950  BP: (!) 141/82  (!) 145/63   Pulse: (!) 105   (!) 119  Resp: 16     Temp:      TempSrc:      SpO2: 93%     Weight:  125.7 kg    Height:        Intake/Output Summary (Last 24 hours) at 02/21/2019 1533 Last data filed at 02/21/2019 0950 Gross per 24 hour  Intake --  Output 1320 ml  Net -1320 ml   Last 3 Weights 02/21/2019 02/20/2019 02/11/2019  Weight (lbs) 277 lb 1.9 oz 279 lb 8.7 oz 283 lb  Weight (kg) 125.7 kg 126.8 kg 128.368 kg      Telemetry    afib with rates 90-140s - Personally Reviewed  ECG    afib rvr at 146 bpm 02/11/19 - Personally Reviewed  Physical Exam   GEN: No acute distress.   Neck: No visible JVD Cardiac: irregularly irregular in the 80s, no murmurs, rubs, or gallops.  Respiratory: Clear to auscultation bilaterally. GI: Soft, nontender, non-distended  MS: bilateral nonpitting edema;  No deformity. Neuro:  Nonfocal  Psych: Normal affect   Labs    High Sensitivity Troponin:   Recent Labs  Lab 02/11/19 1836 02/11/19 2045  TROPONINIHS 10 10      Chemistry Recent Labs  Lab 02/19/19 0500 02/20/19 0237 02/21/19 0304  NA 132* 135 135  K 3.4* 3.4* 3.7  CL 99 102 106  CO2 24 24 23   GLUCOSE 113* 118* 112*  BUN 13 9 9   CREATININE 1.11 1.19 1.23  CALCIUM 7.5* 7.7* 7.8*  GFRNONAA >60 57* 54*  GFRAA >60 >60 >60  ANIONGAP 9 9 6      Hematology Recent Labs  Lab 02/19/19 0500 02/20/19 0237 02/21/19 0304  WBC 7.7 7.9 8.4  RBC 3.67* 3.67* 3.76*  HGB 11.0* 11.0* 11.2*  HCT 31.7* 31.5* 32.5*  MCV 86.4 85.8 86.4  MCH 30.0 30.0 29.8  MCHC 34.7 34.9 34.5  RDW 13.6 13.7 14.1  PLT 135* 138* 150    BNPNo results for input(s): BNP, PROBNP in the last 168 hours.   DDimer No results for input(s): DDIMER in the last 168 hours.   Radiology    No results found.  Cardiac Studies   Echo 02/13/19  1. Left ventricular ejection fraction, by visual estimation, is 60 to  65%. The left ventricle has normal function. There is moderately increased  left ventricular hypertrophy.  2. Definity contrast agent was given IV to delineate the left ventricular  endocardial borders.  3. Left ventricular diastolic parameters are indeterminate.  4. The left ventricle has no regional wall motion abnormalities.  5. Global right ventricle has normal systolic function.The right  ventricular size is normal. Right vetricular wall thickness was not  assessed.  6. Left atrial size was severely dilated.  7. Right atrial size was normal.  8. The mitral valve is grossly normal. No evidence of mitral valve  regurgitation.  9. The tricuspid valve is grossly normal.  10. The tricuspid valve is grossly normal. Tricuspid valve regurgitation  is mild.  11. The aortic valve is tricuspid. Aortic valve regurgitation is not  visualized. No evidence of aortic valve sclerosis or stenosis.    12. The pulmonic valve was grossly normal. Pulmonic valve regurgitation is  not visualized.  13. Mildly elevated pulmonary artery systolic pressure.  14. The interatrial septum was not well visualized.   Patient Profile     84 y.o. male with PMH CAD s/p prior stent 2011 (med mgmt of [facility]), hypertension, hyperlipidemia, thyroid disease who is seen for afib RVR in the setting of Covid infection.  Assessment & Plan    Atrial fibrillation with RVR: -rates improved at rest but still pops up with activity -normal EF on echo. -on amiodarone 200 mg BID, diltiazem 360 mg daily, digoxin 0.125 mg daily -increasing metoprolol to 100 mg BID today -on apixaban 5 mg BID for anticoagulation.  CAD: no symptoms, stable  Hypertension: well controlled, tolerating above rate control agetns  Per primary team and consults: -hyperthyroidism: may also be driving some afib -covid: recovering well -SBO/ventral incisional hernia  For questions or updates, please contact [place] Please consult [email or web address] for contact info under        Signed, Buford Dresser, MD  02/21/2019, 3:33 PM

## 2019-02-21 NOTE — Plan of Care (Signed)
Pt HR going up as high as the 140s on the monitor while awake but does not sustain. Asymptomatic at this time, denies chest pain or SOB. Will continue to monitor.

## 2019-02-21 NOTE — Progress Notes (Signed)
Physical Therapy Treatment Patient Details Name: Derek Foster MRN: [id number] DOB: 09-10-1935 Today's Date: 02/21/2019    History of Present Illness 84 year old male with history of CAD status post stent in 2011, hypertension, hyperlipidemia, GERD, hypothyroidism presented with abdominal pain, nausea, vomiting with generalized weakness since his recent Covid diagnosis on 02/08/2020 (patient's wife passed away from COVID-19 2 weeks ago).In the ED, he was noted to be in A. fib with RVR with rates in 140s to 160s and small bowel obstruction.  Cardiology and general surgery were consulted.    PT Comments    Pt admitted with above diagnosis. Pt was able to ambulate with RW in room with min guard to min assist limited due to HR to 171 bpm.  Nurse aware.  Pt unsteady on his feet.  Pt currently with functional limitations due to balance and endurance deficits.. Pt will benefit from skilled PT to increase their independence and safety with mobility to allow discharge to the venue listed below.     Follow Up Recommendations  SNF;Supervision/Assistance - 24 hour(needs some assist and reports 24 hour assist not available.)     Equipment Recommendations  None recommended by PT    Recommendations for Other Services       Precautions / Restrictions Precautions Precautions: Fall Precaution Comments: watch HR Restrictions Weight Bearing Restrictions: No    Mobility  Bed Mobility Overal bed mobility: Needs Assistance Bed Mobility: Supine to Sit     Supine to sit: Min assist;HOB elevated     General bed mobility comments: assist for trunk elevation, increased time/effort  Transfers Overall transfer level: Needs assistance Equipment used: Rolling walker (2 wheeled) Transfers: Sit to/from [facility] Sit to Stand: Min assist;From elevated surface Stand pivot transfers: Min assist       General transfer comment: minA for balance upon standing to RW; VCs for maintaining  closer proximity to RW  Ambulation/Gait Ambulation/Gait assistance: Min Web designer (Feet): 10 Feet Assistive device: Rolling walker (2 wheeled) Gait Pattern/deviations: Step-to pattern;Step-through pattern;Decreased stride length;Shuffle;Trunk flexed   Gait velocity interpretation: <1.31 ft/sec, indicative of household ambulator General Gait Details: cues for walker safety, assist for balance, HR up to 171 briefly with ambulation, sustained 150's to 160's, RN aware. Limited ambulation due to incr HR. Meds were already given for HR per nurse.    Stairs             Wheelchair Mobility    Modified Rankin (Stroke Patients Only)       Balance Overall balance assessment: Needs assistance Sitting-balance support: Feet supported Sitting balance-Leahy Scale: Good Sitting balance - Comments: seated EOB able to scoot to edge on his own   Standing balance support: Bilateral upper extremity supported Standing balance-Leahy Scale: Poor Standing balance comment: UE support for balance due to weakness                            Cognition Arousal/Alertness: Awake/alert Behavior During Therapy: WFL for tasks assessed/performed Overall Cognitive Status: Within Functional Limits for tasks assessed                                 General Comments: for basic functional tasks, not formally assessed      Exercises General Exercises - Upper Extremity Shoulder Flexion: AROM;Both;10 reps Elbow Flexion: AROM;Both;Seated Digit Composite Flexion: AROM;Left;10 reps;Seated Composite Extension: AROM;Left;10 reps;Seated General Exercises - Lower  Extremity Ankle Circles/Pumps: AROM;Both;Seated Quad Sets: AROM;Both;Seated Hip Flexion/Marching: AROM;Both;Seated Other Exercises Other Exercises: use of IS and flutter valve x5 each, pt pulling to 1750    General Comments General comments (skin integrity, edema, etc.): pt with increased HR during session,  fluctuating 128 to max 171 bpm with activity, O2 sats maintaining >92% on RA, BP 152/89 while seated in recliner end of session      Pertinent Vitals/Pain Pain Assessment: No/denies pain    Home Living                      Prior Function            PT Goals (current goals can now be found in the care plan section) Acute Rehab PT Goals Patient Stated Goal: to get stronger Progress towards PT goals: Progressing toward goals    Frequency    Min 3X/week      PT Plan Current plan remains appropriate    Co-evaluation PT/OT/SLP Co-Evaluation/Treatment: Yes Reason for Co-Treatment: For patient/therapist safety PT goals addressed during session: Mobility/safety with mobility OT goals addressed during session: ADL's and self-care      AM-PAC PT "6 Clicks" Mobility   Outcome Measure  Help needed turning from your back to your side while in a flat bed without using bedrails?: None Help needed moving from lying on your back to sitting on the side of a flat bed without using bedrails?: A Little Help needed moving to and from a bed to a chair (including a wheelchair)?: A Little Help needed standing up from a chair using your arms (e.g., wheelchair or bedside chair)?: A Little Help needed to walk in hospital room?: A Little Help needed climbing 3-5 steps with a railing? : A Little 6 Click Score: 19    End of Session Equipment Utilized During Treatment: Gait belt Activity Tolerance: Treatment limited secondary to medical complications (Comment)(HR up to 171 with ambulation) Patient left: in chair;with call bell/phone within reach;with chair alarm set Nurse Communication: Mobility status PT Visit Diagnosis: Other abnormalities of gait and mobility (R26.89);Difficulty in walking, not elsewhere classified (R26.2);Muscle weakness (generalized) (M62.81)     Time: [id number] PT Time Calculation (min) (ACUTE ONLY): 24 min  Charges:  $Gait Training: 8-22 mins                      Pranav Lince W,PT [place] Pager:  [phone]  Office:  [place] 02/21/2019, 3:50 PM

## 2019-02-21 NOTE — TOC Progression Note (Signed)
Transition of Care [facility]) - Progression Note    Patient Details  Name: Derek Foster MRN: [id number] Date of Birth: [birth year]  Transition of Care [facility] CM/SW [place], [place] Phone Number: 02/21/2019, 11:13 AM  Clinical Narrative:    CSW informed by Ronney Lion there will be a $200 upfront co-pay cost for SNF stay. CSW spoke with patient and informed of upfront co-pay amount.   Expected Discharge Plan: [place] Barriers to Discharge: Continued Medical Work up  Expected Discharge Plan and Services Expected Discharge Plan: [place] In-house Referral: Clinical Social Work     Living arrangements for the past 2 months: Single Family Home                                       Social Determinants of Health (SDOH) Interventions    Readmission Risk Interventions No flowsheet data found.

## 2019-02-21 NOTE — Progress Notes (Signed)
Transitions of Care Pharmacist Note  Derek Foster is a 84 y.o. male that has been diagnosed with A Fib and will be prescribed Eliquis (apixaban) at discharge.   Patient Education: I provided the following education on Eliquis to the patient: How to take the medication Described what the medication is Signs of bleeding Signs/symptoms of VTE and stroke   Discharge Medications Plan: The patient wants to have their discharge medications filled by the Transitions of Care pharmacy rather than their usual pharmacy.  The discharge orders pharmacy has been changed to the Transitions of Care pharmacy, the patient will receive a phone call regarding co-pay, and their medications will be delivered by the Transitions of Care pharmacy.    Thank you,    Eddie Candle, PharmD PGY-1 Pharmacy Resident   Please check amion for clinical pharmacist contact number  February 21, 2019

## 2019-02-22 LAB — BASIC METABOLIC PANEL
Anion gap: 9 (ref 5–15)
BUN: 12 mg/dL (ref 8–23)
CO2: 24 mmol/L (ref 22–32)
Calcium: 7.7 mg/dL — ABNORMAL LOW (ref 8.9–10.3)
Chloride: 104 mmol/L (ref 98–111)
Creatinine, Ser: 1.31 mg/dL — ABNORMAL HIGH (ref 0.61–1.24)
GFR calc Af Amer: 58 mL/min — ABNORMAL LOW (ref >60)
GFR calc non Af Amer: 50 mL/min — ABNORMAL LOW (ref >60)
Glucose, Bld: 118 mg/dL — ABNORMAL HIGH (ref 70–99)
Potassium: 3.5 mmol/L (ref 3.5–5.1)
Sodium: 137 mmol/L (ref 135–145)

## 2019-02-22 LAB — CBC
HCT: 31.8 % — ABNORMAL LOW (ref 39.0–52.0)
Hemoglobin: 10.6 g/dL — ABNORMAL LOW (ref 13.0–17.0)
MCH: 29.4 pg (ref 26.0–34.0)
MCHC: 33.3 g/dL (ref 30.0–36.0)
MCV: 88.3 fL (ref 80.0–100.0)
Platelets: 171 10*3/uL (ref 150–400)
RBC: 3.6 MIL/uL — ABNORMAL LOW (ref 4.22–5.81)
RDW: 14.3 % (ref 11.5–15.5)
WBC: 9.4 10*3/uL (ref 4.0–10.5)
nRBC: 0 % (ref 0.0–0.2)

## 2019-02-22 LAB — MAGNESIUM: Magnesium: 1.7 mg/dL (ref 1.7–2.4)

## 2019-02-22 MED ORDER — POTASSIUM CHLORIDE CRYS ER 20 MEQ PO TBCR
40.0000 meq | EXTENDED_RELEASE_TABLET | Freq: Once | ORAL | Status: AC
  Administered 2019-02-22: 40 meq via ORAL
  Filled 2019-02-22: qty 2

## 2019-02-22 NOTE — Progress Notes (Signed)
Physical Therapy Treatment Patient Details Name: Derek Foster MRN: [id number] DOB: 1935/03/13 Today's Date: 02/22/2019    History of Present Illness 84 year old male with history of CAD status post stent in 2011, hypertension, hyperlipidemia, GERD, hypothyroidism presented with abdominal pain, nausea, vomiting with generalized weakness since his recent Covid diagnosis on Feb 03, 2020 (patient's wife passed away from COVID-19 2 weeks ago).In the ED, he was noted to be in A. fib with RVR with rates in 140s to 160s and small bowel obstruction.  Cardiology and general surgery were consulted.    PT Comments    Pt admitted with above diagnosis. Pt was able to ambulate with RW in room 25 feet with min assist for steering RW and cues.  Pt HR better today but still up to 157 bpm with activity. Will continue to follow.  Pt currently with functional limitations due to balance and endurance deficits. Pt will benefit from skilled PT to increase their independence and safety with mobility to allow discharge to the venue listed below.     Follow Up Recommendations  SNF;Supervision/Assistance - 24 hour(needs some assist and reports 24 hour assist not available.)     Equipment Recommendations  None recommended by PT    Recommendations for Other Services       Precautions / Restrictions Precautions Precautions: Fall Precaution Comments: watch HR Restrictions Weight Bearing Restrictions: No    Mobility  Bed Mobility Overal bed mobility: Needs Assistance Bed Mobility: Supine to Sit     Supine to sit: Min assist;HOB elevated     General bed mobility comments: assist for trunk elevation, increased time/effort  Transfers Overall transfer level: Needs assistance Equipment used: Rolling walker (2 wheeled) Transfers: Sit to/from [facility] Sit to Stand: Min assist;From elevated surface         General transfer comment: minA for balance upon standing to RW; VCs for maintaining  closer proximity to RW  Ambulation/Gait Ambulation/Gait assistance: Min Web designer (Feet): 25 Feet Assistive device: Rolling walker (2 wheeled) Gait Pattern/deviations: Step-to pattern;Step-through pattern;Decreased stride length;Shuffle;Trunk flexed   Gait velocity interpretation: <1.31 ft/sec, indicative of household ambulator General Gait Details: cues for walker safety, assist for balance, HR up to 157 briefly with ambulation, sustained 130'sto 140's, therefore lower than last visit.  MD came in and is  aware. Limited ambulation due to incr HR but pt did incr distance. Meds were already given for HR per nurse.    Stairs             Wheelchair Mobility    Modified Rankin (Stroke Patients Only)       Balance Overall balance assessment: Needs assistance Sitting-balance support: Feet supported Sitting balance-Leahy Scale: Good Sitting balance - Comments: seated EOB able to scoot to edge on his own   Standing balance support: Bilateral upper extremity supported Standing balance-Leahy Scale: Poor Standing balance comment: UE support for balance due to weakness                            Cognition Arousal/Alertness: Awake/alert Behavior During Therapy: WFL for tasks assessed/performed Overall Cognitive Status: Within Functional Limits for tasks assessed                                 General Comments: for basic functional tasks, not formally assessed      Exercises General Exercises - Lower Extremity Ankle Circles/Pumps: AROM;Both;Seated  Quad Sets: AROM;Both;Seated Long Arc Quad: AROM;Both;10 reps;Seated Hip Flexion/Marching: AROM;Both;Seated    General Comments General comments (skin integrity, edema, etc.): HR 117-128 bpm at rest with O2 95%; HR 157 with activity for brief period and 137 bpm at end of treatment.       Pertinent Vitals/Pain Pain Assessment: No/denies pain    Home Living                       Prior Function            PT Goals (current goals can now be found in the care plan section) Acute Rehab PT Goals Patient Stated Goal: to get stronger Progress towards PT goals: Progressing toward goals    Frequency    Min 3X/week      PT Plan Current plan remains appropriate    Co-evaluation              AM-PAC PT "6 Clicks" Mobility   Outcome Measure  Help needed turning from your back to your side while in a flat bed without using bedrails?: None Help needed moving from lying on your back to sitting on the side of a flat bed without using bedrails?: A Little Help needed moving to and from a bed to a chair (including a wheelchair)?: A Little Help needed standing up from a chair using your arms (e.g., wheelchair or bedside chair)?: A Little Help needed to walk in hospital room?: A Little Help needed climbing 3-5 steps with a railing? : A Little 6 Click Score: 19    End of Session Equipment Utilized During Treatment: Gait belt Activity Tolerance: Treatment limited secondary to medical complications (Comment)(HR up to 157 with ambulation) Patient left: in chair;with call bell/phone within reach;with chair alarm set Nurse Communication: Mobility status PT Visit Diagnosis: Other abnormalities of gait and mobility (R26.89);Difficulty in walking, not elsewhere classified (R26.2);Muscle weakness (generalized) (M62.81)     Time: [id number] PT Time Calculation (min) (ACUTE ONLY): 23 min  Charges:  $Gait Training: 8-22 mins $Therapeutic Exercise: 8-22 mins                     Kessie Croston W,PT Acute Rehabilitation Services Pager:  [phone]  Office:  [place] 02/22/2019, 11:05 AM

## 2019-02-22 NOTE — Plan of Care (Signed)
  Problem: Education: Goal: Knowledge of General Education information will improve Description: Including pain rating scale, medication(s)/side effects and non-pharmacologic comfort measures Outcome: Progressing   Problem: Activity: Goal: Risk for activity intolerance will decrease Outcome: Progressing   Problem: Education: Goal: Knowledge of risk factors and measures for prevention of condition will improve Outcome: Progressing   Problem: Respiratory: Goal: Will maintain a patent airway Outcome: Progressing Goal: Complications related to the disease process, condition or treatment will be avoided or minimized Outcome: Progressing

## 2019-02-22 NOTE — Progress Notes (Signed)
Patient ID: Derek Foster, male   DOB: [birth year], 84 y.o.   MRN: [id number]  PROGRESS NOTE    Derek Foster  [id number] DOB: 20-May-1935 DOA: 02/11/2019 PCP: Celene Squibb, MD   Brief Narrative:  84 year old male with history of CAD status post stent in 2011, hypertension, hyperlipidemia, GERD, hypothyroidism presented with abdominal pain, nausea, vomiting with generalized weakness since his recent Covid diagnosis on 2020/02/14 (patient's wife passed away from COVID-72 2 weeks ago). In the ED, he was noted to be in A. fib with RVR with rates in 140s to 160s and small bowel obstruction.  Cardiology and general surgery were consulted.  Treated with amiodarone drip/Cardizem drip/IV metoprolol and digoxin.  SBO was treated with NG tube and bowel rest.  Assessment & Plan:   New onset paroxysmal atrial fibrillation/flutter with rapid ventricular response -Cardiology following.   -Cardiology switched to oral amiodarone, Cardizem, metoprolol, digoxin and Eliquis on 02/20/2019.  Metoprolol dose was increased 200 mg twice a day yesterday by cardiology.  Currently still tachycardic with heart rates going up to the 140s with minimal ambulation. -2D echo showed preserved EF  Hyperthyroidism -TSH/T4 consistent with hyperthyroidism.  Started methimazole 5 mg twice a day on 02/20/2019.  Outpatient follow-up with PCP/endocrinology  Small bowel obstruction Recurrent ventral incisional hernia -Likely secondary to adhesions from prior surgeries and ventral hernia -General surgery is now following peripherally.  Status post NG tube removal on 02/09/2019.  Currently tolerating diet and having bowel movements.  Hypokalemia  -Potassium 3.5 today.  Will replace.  Acute kidney injury -Likely secondary to GI losses.  Improved.  IV fluids discontinued on 02/19/2019  Thrombocytopenia -Questionable cause.  Resolved.  No signs of bleeding  Leukocytosis -Most likely reactive.  Resolved  Recent COVID-19 infection  -Patient reports being diagnosed on 02-14-2023 at his PCP Dr. Josue Hector office completed 3-week isolation period on 1/21, repeat Covid PCR was positive on 1/22, suspect this is residual viral fragments, I called Dr. Josue Hector office and was able to confirm original positive Covid diagnosis on 14-Feb-2023 -Did have generalized weakness secondary to this without respiratory symptoms  History of CAD -Remote history of stents -Stable  Hypertension -Continue Cardizem and metoprolol.  BP stable  GERD -Continue oral PPI.  Generalized deconditioning -Prognosis is guarded.  PT recommends SNF placement.  Social worker following.  DVT prophylaxis: Heparin drip Code Status: DNR Family Communication: Spoke to patient at bedside.   Disposition Plan: We will remain inpatient as patient is still tachycardic.  Probable discharge in 1 to 2 days if heart rate stabilizes and if cleared by cardiology.  Will need SNF placement.   Consultants: Cardiology/general surgery  Procedures: None  Antimicrobials:  None  Subjective: Patient seen and examined at bedside.  Poor historian.  Denies palpitations.  Denies chest pain.  Feels very weak.  No overnight fever, vomiting, worsening shortness of breath.  Objective: Vitals:   02/21/19 0949 02/21/19 0950 02/21/19 1946 02/22/19 0400  BP: (!) 145/63  (!) 144/72 (!) 146/75  Pulse:  (!) 119 (!) 112 (!) 107  Resp:   17 19  Temp:      TempSrc:      SpO2:   95% 94%  Weight:      Height:        Intake/Output Summary (Last 24 hours) at 02/22/2019 0737 Last data filed at 02/22/2019 0600 Gross per 24 hour  Intake -  Output 1520 ml  Net -1520 ml   [employer]   02/11/19  1832 02/20/19 0433 02/21/19 0655  Weight: 128.4 kg 126.8 kg 125.7 kg    Examination:  General exam: No acute distress.  Poor historian.  Looks chronically ill. ENT: JVD is not elevated Respiratory system: Bilateral decreased breath sounds at bases with some crackles.  No wheezing   cardiovascular system: Still tachycardic, S1-S2 heard gastrointestinal system: Abdomen is slightly distended, soft and nontender.  Normal bowel sounds present.  Left-sided ventral hernia present  extremities: No clubbing or cyanosis; bilateral trace lower extremity edema  Central nervous system: Awake and alert.  Moving extremities.  Poor historian skin: No rashes or ulcers Psychiatry: Looks withdrawn and has flat affect.  Data Reviewed: I have personally reviewed following labs and imaging studies  CBC: Recent Labs  Lab 02/17/19 0648 02/17/19 0648 02/18/19 0832 02/19/19 0500 02/20/19 0237 02/21/19 0304 02/22/19 0214  WBC 15.0*   < > 7.5 7.7 7.9 8.4 9.4  NEUTROABS 11.9*  --   --   --   --   --   --   HGB 12.9*   < > 11.7* 11.0* 11.0* 11.2* 10.6*  HCT 38.5*   < > 34.8* 31.7* 31.5* 32.5* 31.8*  MCV 89.3   < > 88.3 86.4 85.8 86.4 88.3  PLT 161   < > 134* 135* 138* 150 171   < > = values in this interval not displayed.   Basic Metabolic Panel: Recent Labs  Lab 02/17/19 0648 02/17/19 0648 02/18/19 0832 02/19/19 0500 02/20/19 0237 02/21/19 0304 02/22/19 0214  NA 134*   < > 134* 132* 135 135 137  K 3.2*   < > 3.4* 3.4* 3.4* 3.7 3.5  CL 94*   < > 100 99 102 106 104  CO2 25   < > 26 24 24 23 24   GLUCOSE 98   < > 118* 113* 118* 112* 118*  BUN 17   < > 14 13 9 9 12   CREATININE 1.40*   < > 1.21 1.11 1.19 1.23 1.31*  CALCIUM 8.1*   < > 7.8* 7.5* 7.7* 7.8* 7.7*  MG 1.7  --   --   --  1.8 1.9 1.7   < > = values in this interval not displayed.   GFR: Estimated Creatinine Clearance: 60.4 mL/min (A) (by C-G formula based on SCr of 1.31 mg/dL (H)). Liver Function Tests: No results for input(s): AST, ALT, ALKPHOS, BILITOT, PROT, ALBUMIN in the last 168 hours. No results for input(s): LIPASE, AMYLASE in the last 168 hours. No results for input(s): AMMONIA in the last 168 hours. Coagulation Profile: No results for input(s): INR, PROTIME in the last 168 hours. Cardiac Enzymes: No  results for input(s): CKTOTAL, CKMB, CKMBINDEX, TROPONINI in the last 168 hours. BNP (last 3 results) No results for input(s): PROBNP in the last 8760 hours. HbA1C: No results for input(s): HGBA1C in the last 72 hours. CBG: Recent Labs  Lab 02/21/19 1151  GLUCAP 134*   Lipid Profile: No results for input(s): CHOL, HDL, LDLCALC, TRIG, CHOLHDL, LDLDIRECT in the last 72 hours. Thyroid Function Tests: No results for input(s): TSH, T4TOTAL, FREET4, T3FREE, THYROIDAB in the last 72 hours. Anemia Panel: No results for input(s): VITAMINB12, FOLATE, FERRITIN, TIBC, IRON, RETICCTPCT in the last 72 hours. Sepsis Labs: No results for input(s): PROCALCITON, LATICACIDVEN in the last 168 hours.  No results found for this or any previous visit (from the past 240 hour(s)).       Radiology Studies: No results found.  Scheduled Meds: . amiodarone  200 mg Oral BID  . apixaban  5 mg Oral BID  . digoxin  0.125 mg Oral Daily  . diltiazem  360 mg Oral Daily  . docusate sodium  100 mg Oral BID  . feeding supplement  1 Container Oral BID BM  . feeding supplement (ENSURE ENLIVE)  237 mL Oral BID BM  . guaiFENesin  15 mL Per Tube Q6H  . methimazole  5 mg Oral BID  . metoprolol tartrate  100 mg Oral BID  . multivitamin with minerals  1 tablet Oral Daily  . pantoprazole  40 mg Oral BID   Continuous Infusions: . magnesium sulfate bolus IVPB Stopped (02/17/19 1145)          Aline August, MD [facility] 02/22/2019, 7:37 AM

## 2019-02-22 NOTE — TOC Progression Note (Signed)
Transition of Care [facility]) - Progression Note    Patient Details  Name: Derek Foster MRN: [id number] Date of Birth: Nov 26, 1935  Transition of Care [facility]) CM/SW [place], LCSW Phone Number: 02/22/2019, 8:19 AM  Clinical Narrative:    CSW received insurance approval for patient to [facility] to [facility] when stable: [phone], good for 7 days. Non-emergency ambulance auth: [id number].   Expected Discharge Plan: [place] Barriers to Discharge: Continued Medical Work up  Expected Discharge Plan and Services Expected Discharge Plan: [place] In-house Referral: Clinical Social Work     Living arrangements for the past 2 months: Single Family Home                                       Social Determinants of Health (SDOH) Interventions    Readmission Risk Interventions No flowsheet data found.

## 2019-02-22 NOTE — Progress Notes (Signed)
Brief cardiology follow up note:  Telemetry reviewed overnight. At night, his heart rate dips into the 90s, but when he is awake/exerts himself, this spikes to the 140s.   Unfortunately, we have limited options at this point. Cardioversion unlikely to be successful given that thyroid and Covid are likely part of the drivers for this.   He is now on max dose metoprolol, amiodarone, max dose diltiazem, and digoxin. We do not have further medications for rate control at this time.  He reported feeling well, even with the faster heart rates. His EF is normal. It will likely just take time for his heart rate to improve.  Unfortunately we do not have much more to offer at this time. With recent Covid a, TEE-CV is an aerosolizing procedure, which we try to avoid if possible. He has been on apixaban since 02/20/19 and was on heparin since 02/12/19. He will have had three weeks total of anticoagulation on 03/07/19. At that time, if rates remain difficult to control, we could consider cardioversion only.  I called the patient's room phone and spoke to him for 10 minutes about all of the options. We discussed: 1) medication only 2) medication plus TEE-CV 3) medication, wait till 2/15, then cardioversion alone  We discussed both TEE and CV together.  He would like to proceed with 1), which is medication alone. He feels well overall and does not feel limited when his heart rate is fast. I discussed with him that his heart rate will likely improve as his thyroid normalizes, but we do not have additional medication options at this time. He understands and agrees with the plan.  [facility] will sign off.   Medication Recommendations:  Continue metoprolol tartrate 100 mg BID (can also be consolidated to metoprolol succinate 200 mg daily, would give in AM), diltiazem CD 360 mg daily, amiodarone 200 mg BID until outpatient follow up, digoxin 0.125 mg daily, apixaban 5 mg BID Other recommendations (labs,  testing, etc):  none Follow up as an outpatient:  We will arrange for follow up with Dr. Blenda Mounts team for 2-3 weeks.

## 2019-02-22 NOTE — Progress Notes (Signed)
MEWs score yellow due to patient's elevated heart rate. This is not a new issue and the MD is aware.

## 2019-02-23 LAB — T4, FREE: Free T4: 2.15 ng/dL — ABNORMAL HIGH (ref 0.61–1.12)

## 2019-02-23 LAB — BASIC METABOLIC PANEL
Anion gap: 10 (ref 5–15)
BUN: 11 mg/dL (ref 8–23)
CO2: 22 mmol/L (ref 22–32)
Calcium: 7.9 mg/dL — ABNORMAL LOW (ref 8.9–10.3)
Chloride: 105 mmol/L (ref 98–111)
Creatinine, Ser: 1.15 mg/dL (ref 0.61–1.24)
GFR calc Af Amer: >60 mL/min (ref >60)
GFR calc non Af Amer: 59 mL/min — ABNORMAL LOW (ref >60)
Glucose, Bld: 113 mg/dL — ABNORMAL HIGH (ref 70–99)
Potassium: 3.6 mmol/L (ref 3.5–5.1)
Sodium: 137 mmol/L (ref 135–145)

## 2019-02-23 LAB — CBC
HCT: 32 % — ABNORMAL LOW (ref 39.0–52.0)
Hemoglobin: 10.6 g/dL — ABNORMAL LOW (ref 13.0–17.0)
MCH: 29.8 pg (ref 26.0–34.0)
MCHC: 33.1 g/dL (ref 30.0–36.0)
MCV: 89.9 fL (ref 80.0–100.0)
Platelets: 185 10*3/uL (ref 150–400)
RBC: 3.56 MIL/uL — ABNORMAL LOW (ref 4.22–5.81)
RDW: 14.5 % (ref 11.5–15.5)
WBC: 7.3 10*3/uL (ref 4.0–10.5)
nRBC: 0 % (ref 0.0–0.2)

## 2019-02-23 LAB — MAGNESIUM: Magnesium: 1.7 mg/dL (ref 1.7–2.4)

## 2019-02-23 LAB — TSH: TSH: 0.056 u[IU]/mL — ABNORMAL LOW (ref 0.350–4.500)

## 2019-02-23 MED ORDER — METOPROLOL TARTRATE 50 MG PO TABS
150.0000 mg | ORAL_TABLET | Freq: Two times a day (BID) | ORAL | Status: DC
  Administered 2019-02-23: 150 mg via ORAL
  Filled 2019-02-23: qty 1

## 2019-02-23 MED ORDER — DIGOXIN 0.25 MG/ML IJ SOLN
0.1250 mg | Freq: Four times a day (QID) | INTRAMUSCULAR | Status: AC
  Administered 2019-02-23 (×2): 0.125 mg via INTRAVENOUS
  Filled 2019-02-23 (×2): qty 2

## 2019-02-23 MED ORDER — POTASSIUM CHLORIDE CRYS ER 20 MEQ PO TBCR
40.0000 meq | EXTENDED_RELEASE_TABLET | Freq: Once | ORAL | Status: AC
  Administered 2019-02-23: 40 meq via ORAL
  Filled 2019-02-23: qty 2

## 2019-02-23 MED ORDER — DIGOXIN 125 MCG PO TABS
0.1250 mg | ORAL_TABLET | Freq: Every day | ORAL | Status: DC
  Administered 2019-02-24 – 2019-03-12 (×17): 0.125 mg via ORAL
  Filled 2019-02-23 (×17): qty 1

## 2019-02-23 NOTE — Progress Notes (Addendum)
Patient ID: Derek Foster, male   DOB: January 17, 1936, 84 y.o.   MRN: [id number]  PROGRESS NOTE    Derek Foster  [id number] DOB: 1935/12/20 DOA: 02/11/2019 PCP: Celene Squibb, MD   Brief Narrative:  84 year old male with history of CAD status post stent in 2011, hypertension, hyperlipidemia, GERD, hypothyroidism presented with abdominal pain, nausea, vomiting with generalized weakness since his recent Covid diagnosis on 02/12/20 (patient's wife passed away from COVID-44 2 weeks ago). In the ED, he was noted to be in A. fib with RVR with rates in 140s to 160s and small bowel obstruction.  Cardiology and general surgery were consulted.  Treated with amiodarone drip/Cardizem drip/IV metoprolol and digoxin.  SBO was treated with NG tube and bowel rest.   Subjective:  Patient in bed, appears comfortable, denies any headache, no fever, no chest pain or pressure, no shortness of breath , no abdominal pain. No focal weakness.    Assessment & Plan:   New onset paroxysmal atrial fibrillation/flutter with rapid ventricular response -hard to control rate, [place] vas 2 score of at least 3.  Currently on Eliquis, amiodarone, diltiazem along with metoprolol, was also on oral digoxin but heart rate was around 120 to 130 bpm, give him IV digoxin 2 doses on 02/23/2019, have also increased beta-blocker dose.  Rate seems to have improved.  Will resume oral digoxin from 02/24/2019 if rate remains stable.  Cardiology on board.  Echo shows preserved EF.  TSH along with free T4 and T3 pending.   Hyperthyroidism -TSH/T4 consistent with hyperthyroidism.  Started methimazole 5 mg twice a day on 02/20/2019.  Outpatient follow-up with PCP/endocrinology.  Repeat thyroid enzymes.  Small bowel obstruction Recurrent ventral incisional hernia -Likely secondary to adhesions from prior surgeries and ventral hernia -General surgery is now following peripherally.  Status post NG tube removal on 02/09/2019.  Currently tolerating diet and  having bowel movements.  Hypokalemia  -Potassium 3.5 today.  Will replace.  Acute kidney injury -Likely secondary to GI losses.  Improved.  IV fluids discontinued on 02/19/2019  Thrombocytopenia -Questionable cause.  Resolved.  No signs of bleeding  Leukocytosis -Most likely reactive.  Resolved  Recent COVID-19 infection -Patient reports being diagnosed on 02/12/2023 at his PCP Dr. Josue Hector office completed 3-week isolation period on 1/21, repeat Covid PCR was positive on 1/22, suspect this is residual viral fragments, I called Dr. Josue Hector office and was able to confirm original positive Covid diagnosis on 2023/02/12 -Did have generalized weakness secondary to this without respiratory symptoms  History of CAD -Remote history of stents -Stable  Hypertension -Continue Cardizem and metoprolol.  BP stable  GERD -Continue oral PPI.  Generalized deconditioning -Prognosis is guarded.  PT recommends SNF placement.  Social worker following.    DVT prophylaxis: Eliquis Code Status: DNR Family Communication: Spoke to patient at bedside.   Disposition Plan: SNF placement once heart rate is well controlled.  Still in RVR. Consultants: Cardiology/general surgery  Procedures: None  Antimicrobials:  None   Objective: Vitals:   02/23/19 0435 02/23/19 0816 02/23/19 0824 02/23/19 1044  BP: (!) 152/64  (!) 148/79   Pulse: 94  (!) 143   Resp: 17 16 18 19   Temp: 97.8 F (36.6 C)  98.5 F (36.9 C)   TempSrc: Oral  Oral   SpO2: 95% 97% 98% 96%  Weight:      Height:        Intake/Output Summary (Last 24 hours) at 02/23/2019 1148 Last data filed at  02/23/2019 0400 Gross per 24 hour  Intake 240 ml  Output 1150 ml  Net -910 ml   [employer]   02/11/19 1832 02/20/19 0433 02/21/19 0655  Weight: 128.4 kg 126.8 kg 125.7 kg    Examination:  Awake Alert,   No new F.N deficits, Normal affect Derek Foster.AT,[name] Supple Neck,No JVD, No cervical lymphadenopathy appriciated.  Symmetrical  Chest wall movement, Good air movement bilaterally, CTAB iRRR,No Gallops, Rubs or new Murmurs, No Parasternal Heave +ve B.Sounds, Abd Soft, No tenderness, No organomegaly appriciated, No rebound - guarding or rigidity. No Cyanosis, Clubbing or edema, No new Rash or bruise   Data Reviewed: I have personally reviewed following labs and imaging studies  CBC: Recent Labs  Lab 02/17/19 0648 02/18/19 0832 02/19/19 0500 02/20/19 0237 02/21/19 0304 02/22/19 0214 02/23/19 0440  WBC 15.0*   < > 7.7 7.9 8.4 9.4 7.3  NEUTROABS 11.9*  --   --   --   --   --   --   HGB 12.9*   < > 11.0* 11.0* 11.2* 10.6* 10.6*  HCT 38.5*   < > 31.7* 31.5* 32.5* 31.8* 32.0*  MCV 89.3   < > 86.4 85.8 86.4 88.3 89.9  PLT 161   < > 135* 138* 150 171 185   < > = values in this interval not displayed.   Basic Metabolic Panel: Recent Labs  Lab 02/17/19 0648 02/18/19 0832 02/19/19 0500 02/20/19 0237 02/21/19 0304 02/22/19 0214 02/23/19 0440  NA 134*   < > 132* 135 135 137 137  K 3.2*   < > 3.4* 3.4* 3.7 3.5 3.6  CL 94*   < > 99 102 106 104 105  CO2 25   < > 24 24 23 24 22   GLUCOSE 98   < > 113* 118* 112* 118* 113*  BUN 17   < > 13 9 9 12 11   CREATININE 1.40*   < > 1.11 1.19 1.23 1.31* 1.15  CALCIUM 8.1*   < > 7.5* 7.7* 7.8* 7.7* 7.9*  MG 1.7  --   --  1.8 1.9 1.7 1.7   < > = values in this interval not displayed.   GFR: Estimated Creatinine Clearance: 68.8 mL/min (by C-G formula based on SCr of 1.15 mg/dL). Liver Function Tests: No results for input(s): AST, ALT, ALKPHOS, BILITOT, PROT, ALBUMIN in the last 168 hours. No results for input(s): LIPASE, AMYLASE in the last 168 hours. No results for input(s): AMMONIA in the last 168 hours. Coagulation Profile: No results for input(s): INR, PROTIME in the last 168 hours. Cardiac Enzymes: No results for input(s): CKTOTAL, CKMB, CKMBINDEX, TROPONINI in the last 168 hours. BNP (last 3 results) No results for input(s): PROBNP in the last 8760 hours. HbA1C:  No results for input(s): HGBA1C in the last 72 hours. CBG: Recent Labs  Lab 02/21/19 1151  GLUCAP 134*   Lipid Profile: No results for input(s): CHOL, HDL, LDLCALC, TRIG, CHOLHDL, LDLDIRECT in the last 72 hours. Thyroid Function Tests: Recent Labs    02/23/19 0754  TSH 0.056*  FREET4 2.15*   Anemia Panel: No results for input(s): VITAMINB12, FOLATE, FERRITIN, TIBC, IRON, RETICCTPCT in the last 72 hours. Sepsis Labs: No results for input(s): PROCALCITON, LATICACIDVEN in the last 168 hours.  No results found for this or any previous visit (from the past 240 hour(s)).     Radiology Studies: No results found.   Scheduled Meds: . amiodarone  200 mg Oral BID  . apixaban  5 mg Oral BID  . digoxin  0.125 mg Intravenous Q6H  . [START ON 02/24/2019] digoxin  0.125 mg Oral Daily  . diltiazem  360 mg Oral Daily  . docusate sodium  100 mg Oral BID  . feeding supplement  1 Container Oral BID BM  . feeding supplement (ENSURE ENLIVE)  237 mL Oral BID BM  . guaiFENesin  15 mL Per Tube Q6H  . methimazole  5 mg Oral BID  . metoprolol tartrate  100 mg Oral BID  . multivitamin with minerals  1 tablet Oral Daily  . pantoprazole  40 mg Oral BID   Continuous Infusions: . magnesium sulfate bolus IVPB Stopped (02/17/19 1145)    Lala Lund, MD [facility] 02/23/2019, 11:48 AM

## 2019-02-23 NOTE — Progress Notes (Signed)
During shift change, pt agreed to get into chair in the room for breakfast.  Breakfast was delivered, when asking pt if he was ready to get into the chair pt refused.  Pt was coughing on the food.  Explained to pt the risk for aspirating and choking if eating while in bed in a semi-reclined position.  Pt replied with,"then I'll just choke"

## 2019-02-23 NOTE — Plan of Care (Signed)
  Problem: Education: Goal: Knowledge of General Education information will improve Description: Including pain rating scale, medication(s)/side effects and non-pharmacologic comfort measures Outcome: Progressing   Problem: Activity: Goal: Risk for activity intolerance will decrease Outcome: Progressing   Problem: Education: Goal: Knowledge of risk factors and measures for prevention of condition will improve Outcome: Progressing   Problem: Respiratory: Goal: Will maintain a patent airway Outcome: Progressing   Problem: Activity: Goal: Ability to tolerate increased activity will improve Outcome: Progressing

## 2019-02-24 LAB — CBC WITH DIFFERENTIAL/PLATELET
Abs Immature Granulocytes: 0.15 10*3/uL — ABNORMAL HIGH (ref 0.00–0.07)
Basophils Absolute: 0 10*3/uL (ref 0.0–0.1)
Basophils Relative: 1 %
Eosinophils Absolute: 0.2 10*3/uL (ref 0.0–0.5)
Eosinophils Relative: 3 %
HCT: 30 % — ABNORMAL LOW (ref 39.0–52.0)
Hemoglobin: 9.9 g/dL — ABNORMAL LOW (ref 13.0–17.0)
Immature Granulocytes: 2 %
Lymphocytes Relative: 18 %
Lymphs Abs: 1.4 10*3/uL (ref 0.7–4.0)
MCH: 29.6 pg (ref 26.0–34.0)
MCHC: 33 g/dL (ref 30.0–36.0)
MCV: 89.8 fL (ref 80.0–100.0)
Monocytes Absolute: 1 10*3/uL (ref 0.1–1.0)
Monocytes Relative: 13 %
Neutro Abs: 4.7 10*3/uL (ref 1.7–7.7)
Neutrophils Relative %: 63 %
Platelets: 207 10*3/uL (ref 150–400)
RBC: 3.34 MIL/uL — ABNORMAL LOW (ref 4.22–5.81)
RDW: 14.5 % (ref 11.5–15.5)
WBC: 7.5 10*3/uL (ref 4.0–10.5)
nRBC: 0 % (ref 0.0–0.2)

## 2019-02-24 LAB — T3: T3, Total: 82 ng/dL (ref 71–180)

## 2019-02-24 LAB — BASIC METABOLIC PANEL
Anion gap: 8 (ref 5–15)
BUN: 14 mg/dL (ref 8–23)
CO2: 23 mmol/L (ref 22–32)
Calcium: 7.9 mg/dL — ABNORMAL LOW (ref 8.9–10.3)
Chloride: 106 mmol/L (ref 98–111)
Creatinine, Ser: 1.37 mg/dL — ABNORMAL HIGH (ref 0.61–1.24)
GFR calc Af Amer: 55 mL/min — ABNORMAL LOW (ref >60)
GFR calc non Af Amer: 48 mL/min — ABNORMAL LOW (ref >60)
Glucose, Bld: 117 mg/dL — ABNORMAL HIGH (ref 70–99)
Potassium: 3.8 mmol/L (ref 3.5–5.1)
Sodium: 137 mmol/L (ref 135–145)

## 2019-02-24 LAB — BRAIN NATRIURETIC PEPTIDE: B Natriuretic Peptide: 228.2 pg/mL — ABNORMAL HIGH (ref 0.0–100.0)

## 2019-02-24 LAB — MAGNESIUM: Magnesium: 1.7 mg/dL (ref 1.7–2.4)

## 2019-02-24 LAB — C-REACTIVE PROTEIN: CRP: 4.3 mg/dL — ABNORMAL HIGH (ref <1.0)

## 2019-02-24 MED ORDER — POTASSIUM CHLORIDE CRYS ER 20 MEQ PO TBCR
40.0000 meq | EXTENDED_RELEASE_TABLET | Freq: Once | ORAL | Status: AC
  Administered 2019-02-24: 40 meq via ORAL
  Filled 2019-02-24: qty 2

## 2019-02-24 MED ORDER — ENSURE ENLIVE PO LIQD
237.0000 mL | Freq: Every day | ORAL | Status: DC
  Administered 2019-03-06: 237 mL via ORAL

## 2019-02-24 MED ORDER — METHIMAZOLE 5 MG PO TABS
10.0000 mg | ORAL_TABLET | Freq: Three times a day (TID) | ORAL | Status: DC
  Administered 2019-02-24 – 2019-02-27 (×12): 10 mg via ORAL
  Filled 2019-02-24 (×12): qty 2

## 2019-02-24 MED ORDER — METOPROLOL TARTRATE 100 MG PO TABS
200.0000 mg | ORAL_TABLET | Freq: Two times a day (BID) | ORAL | Status: DC
  Administered 2019-02-24 – 2019-02-26 (×5): 200 mg via ORAL
  Filled 2019-02-24 (×5): qty 2

## 2019-02-24 MED ORDER — FUROSEMIDE 10 MG/ML IJ SOLN
40.0000 mg | Freq: Once | INTRAMUSCULAR | Status: AC
  Administered 2019-02-24: 40 mg via INTRAVENOUS
  Filled 2019-02-24: qty 4

## 2019-02-24 NOTE — Plan of Care (Signed)
  Problem: Education: Goal: Knowledge of General Education information will improve Description: Including pain rating scale, medication(s)/side effects and non-pharmacologic comfort measures Outcome: Progressing   Problem: Activity: Goal: Risk for activity intolerance will decrease Outcome: Progressing   Problem: Education: Goal: Knowledge of risk factors and measures for prevention of condition will improve Outcome: Progressing   Problem: Respiratory: Goal: Will maintain a patent airway Outcome: Progressing Goal: Complications related to the disease process, condition or treatment will be avoided or minimized Outcome: Progressing   Problem: Activity: Goal: Ability to tolerate increased activity will improve Outcome: Progressing

## 2019-02-24 NOTE — Progress Notes (Addendum)
Patient ID: Derek Foster, male   DOB: [birth year], 84 y.o.   MRN: [id number]  PROGRESS NOTE    Derek Foster  [id number] DOB: [birth year] DOA: 02/11/2019 PCP: Celene Squibb, MD   Brief Narrative:  84 year old male with history of CAD status post stent in 2011, hypertension, hyperlipidemia, GERD, hypothyroidism presented with abdominal pain, nausea, vomiting with generalized weakness since his recent Covid diagnosis on 02-15-20 (patient's wife passed away from COVID-38 2 weeks ago). In the ED, he was noted to be in A. fib with RVR with rates in 140s to 160s and small bowel obstruction.  Cardiology and general surgery were consulted.  Treated with amiodarone drip/Cardizem drip/IV metoprolol and digoxin.  SBO was treated with NG tube and bowel rest.   Subjective:  Patient in bed, appears comfortable, denies any headache, no fever, no chest pain or pressure, no shortness of breath , no abdominal pain. No focal weakness.    Assessment & Plan:   New onset paroxysmal atrial fibrillation/flutter with rapid ventricular response - hard to control rate, [place] vas 2 score of at least 3.  Currently on Eliquis, amiodarone, diltiazem beta-blocker and digoxin, beta-blocker dose on 02/24/2019, still in RVR, I think this is primarily being driven by hyperthyroidism, methimazole dose increased further.  Will monitor closely..   Hyperthyroidism -TSH is still suppressed with increased T4, have increased methimazole dose.  Will require outpatient endocrine follow-up.    Small bowel obstruction - Recurrent ventral incisional hernia, likely due to underlying adhesions.  Required NG tube.  Now resolved.  Has been seen by general surgery.   Hypokalemia -Replaced will monitor.  Acute kidney injury -Likely secondary to GI losses.  Improved.  IV fluids discontinued on 02/19/2019, has developed some edema will give a trial of Lasix on 02/24/2019 along with potassium supplementation and monitor.  Thrombocytopenia  -Questionable cause.  Resolved.  No signs of bleeding  Leukocytosis -Most likely reactive.  Resolved  Recent COVID-19 infection -Patient reports being diagnosed on 02-15-2023 at his PCP Dr. Josue Hector office completed 3-week isolation period on 1/21, repeat Covid PCR was positive on 1/22, suspect this is residual viral fragments, previous MD had called Dr. Josue Hector office and was able to confirm original positive Covid diagnosis on Feb 15, 2023.  Stable from the standpoint.    History of CAD -Remote history of stents -Stable  Hypertension -Continue on Cardizem and beta-blocker combination.  GERD -Continue oral PPI.  Generalized deconditioning -Prognosis is guarded.  PT recommends SNF placement.  Social worker following.    DVT prophylaxis: Eliquis Code Status: DNR Family Communication: Called daughter Malachy Mood on 02/24/2019 at 11:35 AM - message left Disposition Plan: SNF placement once heart rate is well controlled.  Still in RVR. Consultants: Cardiology/general surgery  Procedures: None  Antimicrobials:  None   Objective: Vitals:   02/23/19 2343 02/24/19 0400 02/24/19 0800 02/24/19 0932  BP: (!) 135/50 (!) 147/59 (!) 154/64 (!) 154/64  Pulse: 92 90 (!) 167 (!) 104  Resp: 20 17 17    Temp: 99.1 F (37.3 C) 97.6 F (36.4 C) 97.8 F (36.6 C)   TempSrc: Oral Oral Oral   SpO2: 90% 95% 91%   Weight:      Height:        Intake/Output Summary (Last 24 hours) at 02/24/2019 1132 Last data filed at 02/24/2019 0500 Gross per 24 hour  Intake 480 ml  Output 1000 ml  Net -520 ml   Filed Weights   02/11/19 1832 02/20/19 0433 02/21/19 [id number]  Weight: 128.4 kg 126.8 kg 125.7 kg    Examination:  Awake Alert,  No new F.N deficits, Normal affect [place].AT,[name] Supple Neck,No JVD, No cervical lymphadenopathy appriciated.  Symmetrical Chest wall movement, Good air movement bilaterally, CTAB iRRR,No Gallops, Rubs or new Murmurs, No Parasternal Heave +ve B.Sounds, Abd Soft, No tenderness,  No organomegaly appriciated, No rebound - guarding or rigidity. No Cyanosis, Clubbing , 1+ edema   Data Reviewed: I have personally reviewed following labs and imaging studies  CBC: Recent Labs  Lab 02/20/19 0237 02/21/19 0304 02/22/19 0214 02/23/19 0440 02/24/19 0316  WBC 7.9 8.4 9.4 7.3 7.5  NEUTROABS  --   --   --   --  4.7  HGB 11.0* 11.2* 10.6* 10.6* 9.9*  HCT 31.5* 32.5* 31.8* 32.0* 30.0*  MCV 85.8 86.4 88.3 89.9 89.8  PLT 138* 150 171 185 [id number]   Basic Metabolic Panel: Recent Labs  Lab 02/20/19 0237 02/21/19 0304 02/22/19 0214 02/23/19 0440 02/24/19 0316  NA 135 135 137 137 137  K 3.4* 3.7 3.5 3.6 3.8  CL 102 106 104 105 106  CO2 24 23 24 22 23   GLUCOSE 118* 112* 118* 113* 117*  BUN 9 9 12 11 14   CREATININE 1.19 1.23 1.31* 1.15 1.37*  CALCIUM 7.7* 7.8* 7.7* 7.9* 7.9*  MG 1.8 1.9 1.7 1.7 1.7   GFR: Estimated Creatinine Clearance: 57.7 mL/min (A) (by C-G formula based on SCr of 1.37 mg/dL (H)). Liver Function Tests: No results for input(s): AST, ALT, ALKPHOS, BILITOT, PROT, ALBUMIN in the last 168 hours. No results for input(s): LIPASE, AMYLASE in the last 168 hours. No results for input(s): AMMONIA in the last 168 hours. Coagulation Profile: No results for input(s): INR, PROTIME in the last 168 hours. Cardiac Enzymes: No results for input(s): CKTOTAL, CKMB, CKMBINDEX, TROPONINI in the last 168 hours. BNP (last 3 results) No results for input(s): PROBNP in the last 8760 hours. HbA1C: No results for input(s): HGBA1C in the last 72 hours. CBG: Recent Labs  Lab 02/21/19 1151  GLUCAP 134*   Lipid Profile: No results for input(s): CHOL, HDL, LDLCALC, TRIG, CHOLHDL, LDLDIRECT in the last 72 hours. Thyroid Function Tests: Recent Labs    02/23/19 0754  TSH 0.056*  FREET4 2.15*   Anemia Panel: No results for input(s): VITAMINB12, FOLATE, FERRITIN, TIBC, IRON, RETICCTPCT in the last 72 hours. Sepsis Labs: No results for input(s): PROCALCITON,  LATICACIDVEN in the last 168 hours.  No results found for this or any previous visit (from the past 240 hour(s)).     Radiology Studies: No results found.   Scheduled Meds: . amiodarone  200 mg Oral BID  . apixaban  5 mg Oral BID  . digoxin  0.125 mg Oral Daily  . diltiazem  360 mg Oral Daily  . docusate sodium  100 mg Oral BID  . feeding supplement  1 Container Oral BID BM  . feeding supplement (ENSURE ENLIVE)  237 mL Oral BID BM  . guaiFENesin  15 mL Per Tube Q6H  . methimazole  10 mg Oral TID  . metoprolol tartrate  200 mg Oral BID  . multivitamin with minerals  1 tablet Oral Daily  . pantoprazole  40 mg Oral BID   Continuous Infusions: . magnesium sulfate bolus IVPB Stopped (02/17/19 1145)    Lala Lund, MD [facility] 02/24/2019, 11:32 AM

## 2019-02-24 NOTE — Progress Notes (Signed)
Physical Therapy Treatment Patient Details Name: Derek Foster MRN: [id number] DOB: October 23, 1935 Today's Date: 02/24/2019    History of Present Illness 84 year old male with history of CAD status post stent in 2011, hypertension, hyperlipidemia, GERD, hypothyroidism presented with abdominal pain, nausea, vomiting with generalized weakness since his recent Covid diagnosis on Jan 25, 2020 (patient's wife passed away from COVID-19 2 weeks ago).In the ED, he was noted to be in A. fib with RVR with rates in 140s to 160s and small bowel obstruction.  Cardiology and general surgery were consulted.    PT Comments    Pt admitted with above diagnosis. Pt was able to ambulate to door and back with cues for posture as he flexes considerably.  Incr distance today as well as HR under better control with highest HR 138 bpm.  Pt progressing slowly.  Pt currently with functional limitations due to balance and endurance deficits. Pt will benefit from skilled PT to increase their independence and safety with mobility to allow discharge to the venue listed below.     Follow Up Recommendations  SNF;Supervision/Assistance - 24 hour(needs some assist and reports 24 hour assist not available.)     Equipment Recommendations  None recommended by PT    Recommendations for Other Services       Precautions / Restrictions Precautions Precautions: Fall Precaution Comments: watch HR Restrictions Weight Bearing Restrictions: No    Mobility  Bed Mobility               General bed mobility comments: in chair on arrival  Transfers Overall transfer level: Needs assistance Equipment used: Rolling walker (2 wheeled) Transfers: Sit to/from Stand;Stand Pivot Transfers Sit to Stand: Min guard         General transfer comment: min guard A for balance upon standing to RW incr time to rise; VCs for maintaining closer proximity to RW  Ambulation/Gait Ambulation/Gait assistance: Min assist Gait Distance (Feet): 40  Feet Assistive device: Rolling walker (2 wheeled) Gait Pattern/deviations: Step-to pattern;Step-through pattern;Decreased stride length;Shuffle;Trunk flexed   Gait velocity interpretation: <1.31 ft/sec, indicative of household ambulator General Gait Details: cues for walker safety to stay in close proximity to RW, assist for balance as well as cues for hand placement, HR up to 138 bpm with ambulation.  Pt with incr distance today.    Stairs             Wheelchair Mobility    Modified Rankin (Stroke Patients Only)       Balance Overall balance assessment: Needs assistance Sitting-balance support: Feet supported;No upper extremity supported Sitting balance-Leahy Scale: Good     Standing balance support: Bilateral upper extremity supported Standing balance-Leahy Scale: Poor Standing balance comment: UE support for balance due to weakness                            Cognition Arousal/Alertness: Awake/alert Behavior During Therapy: WFL for tasks assessed/performed Overall Cognitive Status: Within Functional Limits for tasks assessed                                 General Comments: for basic functional tasks, not formally assessed      Exercises General Exercises - Lower Extremity Ankle Circles/Pumps: AROM;Both;Seated Quad Sets: AROM;Both;Seated Long Arc Quad: AROM;Both;10 reps;Seated Hip Flexion/Marching: AROM;Both;Seated Other Exercises Other Exercises: use of IS and flutter valve x5 each, pt pulling to 1500    General  Comments General comments (skin integrity, edema, etc.): HR 111/138 bpm, 98% on RA, BP 154/64 initially and 135/63 at end of treatment.  Pt able to pull 1500 on incentive spirometer      Pertinent Vitals/Pain Pain Assessment: No/denies pain    Home Living                      Prior Function            PT Goals (current goals can now be found in the care plan section) Acute Rehab PT Goals Patient Stated  Goal: to get stronger Progress towards PT goals: Progressing toward goals    Frequency    Min 3X/week      PT Plan Current plan remains appropriate    Co-evaluation              AM-PAC PT "6 Clicks" Mobility   Outcome Measure  Help needed turning from your back to your side while in a flat bed without using bedrails?: None Help needed moving from lying on your back to sitting on the side of a flat bed without using bedrails?: A Little Help needed moving to and from a bed to a chair (including a wheelchair)?: A Little Help needed standing up from a chair using your arms (e.g., wheelchair or bedside chair)?: A Little Help needed to walk in hospital room?: A Little Help needed climbing 3-5 steps with a railing? : A Little 6 Click Score: 19    End of Session Equipment Utilized During Treatment: Gait belt Activity Tolerance: Patient limited by fatigue Patient left: in chair;with call bell/phone within reach;with chair alarm set Nurse Communication: Mobility status PT Visit Diagnosis: Other abnormalities of gait and mobility (R26.89);Difficulty in walking, not elsewhere classified (R26.2);Muscle weakness (generalized) (M62.81)     Time: [id number] PT Time Calculation (min) (ACUTE ONLY): 16 min  Charges:  $Gait Training: 8-22 mins                     Laquentin Loudermilk W,PT [place] Pager:  [phone]  Office:  [place] 02/24/2019, 11:26 AM

## 2019-02-24 NOTE — Progress Notes (Signed)
Nutrition Follow-up   RD working remotely.  DOCUMENTATION CODES:   Obesity unspecified  INTERVENTION:  Continue Boost Breeze po BID, each supplement provides 250 kcal and 9 grams of protein  Provide Ensure Enlive po once daily, each supplement provides 350 kcal and 20 grams of protein  Encourage adequate PO intake.   NUTRITION DIAGNOSIS:   Increased nutrient needs related to acute illness(COVID-19) as evidenced by estimated needs; ongoing  GOAL:   Patient will meet greater than or equal to 90% of their needs; met  MONITOR:   PO intake, Supplement acceptance, Diet advancement, Labs, Weight trends, Skin, I & O's  REASON FOR ASSESSMENT:   NPO/Clear Liquid Diet    ASSESSMENT:   Derek Foster  is a 84 y.o. male, with past medical history of CAD, status post stent in 2011, hypertension, hyperlipidemia, GERD, hypothyroidism, patient presents with complaints of abdominal pain, nausea, vomiting for last 24 hours, patient report overall generalized weakness, since his recent Covid diagnosis 12/30,(patient's wife passed away from COVID-19 2 weeks ago), patient denies any chest pain, fever, chills, hemoptysis, diarrhea, as well patient reports he has generalized weakness since his COVID-19 diagnosis, has been mainly using his wheelchair, as well he reports poor appetite, did not require admission or any treatment for his COVID-19.- in ED patient was noted to be in A. fib with RVR.  1/23- NGT placed for deocmpression due to SBO 1/25- NGT clamped 1/28- NGT removed, diet advanced  Pt continues on a soft diet with thin liquids. Meal completion has been 75-100%. Pt currently has Boost Breeze ordered and has been consuming them. Pt also with Ensure ordered, however consumption has been more varied. RD to modify nutritional supplement orders as po intake now has improved.   Labs and medications reviewed.   Diet Order:   Diet Order            DIET SOFT Room service appropriate? Yes; Fluid  consistency: Thin; Fluid restriction: 1500 mL Fluid  Diet effective now              EDUCATION NEEDS:   No education needs have been identified at this time  Skin:  Skin Assessment: Reviewed RN Assessment  Last BM:  1/22  Height:   Ht Readings from Last 1 Encounters:  02/11/19 6' 1"  (1.854 m)    Weight:   Wt Readings from Last 1 Encounters:  02/21/19 125.7 kg    Ideal Body Weight:  83.6 kg  BMI:  Body mass index is 36.56 kg/m.  Estimated Nutritional Needs:   Kcal:  2100-2300  Protein:  105-125 grams  Fluid:  1.5 L/day   Derek Parker, MS, RD, LDN RD pager number/after hours weekend pager number on Amion.

## 2019-02-25 LAB — BASIC METABOLIC PANEL
Anion gap: 8 (ref 5–15)
BUN: 14 mg/dL (ref 8–23)
CO2: 24 mmol/L (ref 22–32)
Calcium: 8 mg/dL — ABNORMAL LOW (ref 8.9–10.3)
Chloride: 105 mmol/L (ref 98–111)
Creatinine, Ser: 1.2 mg/dL (ref 0.61–1.24)
GFR calc Af Amer: >60 mL/min (ref >60)
GFR calc non Af Amer: 56 mL/min — ABNORMAL LOW (ref >60)
Glucose, Bld: 113 mg/dL — ABNORMAL HIGH (ref 70–99)
Potassium: 3.7 mmol/L (ref 3.5–5.1)
Sodium: 137 mmol/L (ref 135–145)

## 2019-02-25 LAB — CBC WITH DIFFERENTIAL/PLATELET
Abs Immature Granulocytes: 0.11 10*3/uL — ABNORMAL HIGH (ref 0.00–0.07)
Basophils Absolute: 0.1 10*3/uL (ref 0.0–0.1)
Basophils Relative: 1 %
Eosinophils Absolute: 0.2 10*3/uL (ref 0.0–0.5)
Eosinophils Relative: 3 %
HCT: 31.4 % — ABNORMAL LOW (ref 39.0–52.0)
Hemoglobin: 10.3 g/dL — ABNORMAL LOW (ref 13.0–17.0)
Immature Granulocytes: 2 %
Lymphocytes Relative: 19 %
Lymphs Abs: 1.4 10*3/uL (ref 0.7–4.0)
MCH: 29.6 pg (ref 26.0–34.0)
MCHC: 32.8 g/dL (ref 30.0–36.0)
MCV: 90.2 fL (ref 80.0–100.0)
Monocytes Absolute: 0.9 10*3/uL (ref 0.1–1.0)
Monocytes Relative: 13 %
Neutro Abs: 4.6 10*3/uL (ref 1.7–7.7)
Neutrophils Relative %: 62 %
Platelets: 221 10*3/uL (ref 150–400)
RBC: 3.48 MIL/uL — ABNORMAL LOW (ref 4.22–5.81)
RDW: 14.6 % (ref 11.5–15.5)
WBC: 7.2 10*3/uL (ref 4.0–10.5)
nRBC: 0 % (ref 0.0–0.2)

## 2019-02-25 LAB — MAGNESIUM: Magnesium: 1.7 mg/dL (ref 1.7–2.4)

## 2019-02-25 LAB — BRAIN NATRIURETIC PEPTIDE: B Natriuretic Peptide: 371.8 pg/mL — ABNORMAL HIGH (ref 0.0–100.0)

## 2019-02-25 LAB — C-REACTIVE PROTEIN: CRP: 3.5 mg/dL — ABNORMAL HIGH (ref <1.0)

## 2019-02-25 MED ORDER — FUROSEMIDE 10 MG/ML IJ SOLN
40.0000 mg | Freq: Once | INTRAMUSCULAR | Status: AC
  Administered 2019-02-25: 40 mg via INTRAVENOUS
  Filled 2019-02-25: qty 4

## 2019-02-25 MED ORDER — MAGNESIUM SULFATE IN D5W 1-5 GM/100ML-% IV SOLN
1.0000 g | Freq: Once | INTRAVENOUS | Status: AC
  Administered 2019-02-25: 1 g via INTRAVENOUS
  Filled 2019-02-25: qty 100

## 2019-02-25 MED ORDER — POTASSIUM CHLORIDE CRYS ER 20 MEQ PO TBCR
40.0000 meq | EXTENDED_RELEASE_TABLET | Freq: Once | ORAL | Status: AC
  Administered 2019-02-25: 40 meq via ORAL
  Filled 2019-02-25: qty 2

## 2019-02-25 NOTE — Progress Notes (Signed)
Patient ID: Derek Foster, male   DOB: 03/29/1935, 84 y.o.   MRN: [id number]  PROGRESS NOTE    GRANDVILLE GALEY  [id number] DOB: 09-11-1935 DOA: 02/11/2019 PCP: Celene Squibb, MD   Brief Narrative:  84 year old male with history of CAD status post stent in 2011, hypertension, hyperlipidemia, GERD, hypothyroidism presented with abdominal pain, nausea, vomiting with generalized weakness since his recent Covid diagnosis on 01-24-20 (patient's wife passed away from COVID-79 2 weeks ago). In the ED, he was noted to be in A. fib with RVR with rates in 140s to 160s and small bowel obstruction.  Cardiology and general surgery were consulted.  Treated with amiodarone drip/Cardizem drip/IV metoprolol and digoxin.  SBO was treated with NG tube and bowel rest.   Subjective:  Patient in recliner, denies any headache chest or abdominal pain, no shortness of breath.  Overall feels better.    Assessment & Plan:   New onset paroxysmal atrial fibrillation/flutter with rapid ventricular response - hard to control rate, [place] vas 2 score of at least 3.  Currently on Eliquis, amiodarone, diltiazem beta-blocker and digoxin, beta-blocker dose on 02/24/2019, still in RVR, I think this is primarily being driven by hyperthyroidism, methimazole dose increased further.  Will monitor closely..   Hyperthyroidism -TSH is still suppressed with increased T4, have increased methimazole dose.  Will require outpatient endocrine follow-up.    Small bowel obstruction - Recurrent ventral incisional hernia, likely due to underlying adhesions.  Required NG tube.  Now resolved.  Has been seen by general surgery.   Hypokalemia -Replaced will monitor.  Acute kidney injury -Likely secondary to GI losses.  Improved.  IV fluids discontinued on 02/19/2019, has developed some edema will give a trial of Lasix on 02/24/2019 along with potassium supplementation and monitor.  Thrombocytopenia -Questionable cause.  Resolved.  No signs of bleeding   Leukocytosis -Most likely reactive.  Resolved  Recent COVID-19 infection -Patient reports being diagnosed on 2023-01-24 at his PCP Dr. Josue Hector office completed 3-week isolation period on 1/21, repeat Covid PCR was positive on 1/22, suspect this is residual viral fragments, previous MD had called Dr. Josue Hector office and was able to confirm original positive Covid diagnosis on 2023-01-24.  Stable from the standpoint.    History of CAD -Remote history of stents -Stable  Hypertension -Continue on Cardizem and beta-blocker combination.  GERD -Continue oral PPI.  Generalized deconditioning -Prognosis is guarded.  PT recommends SNF placement.  Social worker following.    DVT prophylaxis: Eliquis Code Status: DNR Family Communication: Called daughter Malachy Mood on 02/24/2019 at 11:35 AM - message left, message left again on 02/25/2019 at 11:36 AM Disposition Plan: SNF placement once heart rate is well controlled.  Still in RVR. Consultants: Cardiology/general surgery  Procedures: None  Antimicrobials:  None   Objective: Vitals:   02/24/19 1942 02/24/19 2333 02/25/19 0347 02/25/19 0816  BP: (!) 153/52 (!) 115/55 (!) 136/58 (!) 126/57  Pulse: 93 74 85 98  Resp: 19 18 17 19   Temp: 97.6 F (36.4 C) 98.1 F (36.7 C) 98.2 F (36.8 C) 98.6 F (37 C)  TempSrc: Oral Oral Oral Axillary  SpO2: 97% 93% 94% 96%  Weight:   124.7 kg   Height:        Intake/Output Summary (Last 24 hours) at 02/25/2019 1136 Last data filed at 02/25/2019 0357 Gross per 24 hour  Intake -  Output 1975 ml  Net -1975 ml   Filed Weights   02/20/19 0433 02/21/19 0655 02/25/19  0347  Weight: 126.8 kg 125.7 kg 124.7 kg    Examination:  Awake Alert,   No new F.N deficits, Normal affect [place].AT,[name] Supple Neck,No JVD, No cervical lymphadenopathy appriciated.  Symmetrical Chest wall movement, Good air movement bilaterally, CTAB RRR,No Gallops, Rubs or new Murmurs, No Parasternal Heave +ve B.Sounds, Abd Soft, No  tenderness, No organomegaly appriciated, No rebound - guarding or rigidity. No Cyanosis,  1+ edema   Data Reviewed: I have personally reviewed following labs and imaging studies  CBC: Recent Labs  Lab 02/21/19 0304 02/22/19 0214 02/23/19 0440 02/24/19 0316 02/25/19 0534  WBC 8.4 9.4 7.3 7.5 7.2  NEUTROABS  --   --   --  4.7 4.6  HGB 11.2* 10.6* 10.6* 9.9* 10.3*  HCT 32.5* 31.8* 32.0* 30.0* 31.4*  MCV 86.4 88.3 89.9 89.8 90.2  PLT 150 171 185 207 [id number]   Basic Metabolic Panel: Recent Labs  Lab 02/21/19 0304 02/22/19 0214 02/23/19 0440 02/24/19 0316 02/25/19 0534  NA 135 137 137 137 137  K 3.7 3.5 3.6 3.8 3.7  CL 106 104 105 106 105  CO2 23 24 22 23 24   GLUCOSE 112* 118* 113* 117* 113*  BUN 9 12 11 14 14   CREATININE 1.23 1.31* 1.15 1.37* 1.20  CALCIUM 7.8* 7.7* 7.9* 7.9* 8.0*  MG 1.9 1.7 1.7 1.7 1.7   GFR: Estimated Creatinine Clearance: 65.7 mL/min (by C-G formula based on SCr of 1.2 mg/dL). Liver Function Tests: No results for input(s): AST, ALT, ALKPHOS, BILITOT, PROT, ALBUMIN in the last 168 hours. No results for input(s): LIPASE, AMYLASE in the last 168 hours. No results for input(s): AMMONIA in the last 168 hours. Coagulation Profile: No results for input(s): INR, PROTIME in the last 168 hours. Cardiac Enzymes: No results for input(s): CKTOTAL, CKMB, CKMBINDEX, TROPONINI in the last 168 hours. BNP (last 3 results) No results for input(s): PROBNP in the last 8760 hours. HbA1C: No results for input(s): HGBA1C in the last 72 hours. CBG: Recent Labs  Lab 02/21/19 1151  GLUCAP 134*   Lipid Profile: No results for input(s): CHOL, HDL, LDLCALC, TRIG, CHOLHDL, LDLDIRECT in the last 72 hours. Thyroid Function Tests: Recent Labs    02/23/19 0754  TSH 0.056*  FREET4 2.15*   Anemia Panel: No results for input(s): VITAMINB12, FOLATE, FERRITIN, TIBC, IRON, RETICCTPCT in the last 72 hours. Sepsis Labs: No results for input(s): PROCALCITON, LATICACIDVEN in the  last 168 hours.  No results found for this or any previous visit (from the past 240 hour(s)).     Radiology Studies: No results found.   Scheduled Meds: . amiodarone  200 mg Oral BID  . apixaban  5 mg Oral BID  . digoxin  0.125 mg Oral Daily  . diltiazem  360 mg Oral Daily  . docusate sodium  100 mg Oral BID  . feeding supplement  1 Container Oral BID BM  . feeding supplement (ENSURE ENLIVE)  237 mL Oral Q1500  . guaiFENesin  15 mL Per Tube Q6H  . methimazole  10 mg Oral TID  . metoprolol tartrate  200 mg Oral BID  . multivitamin with minerals  1 tablet Oral Daily  . pantoprazole  40 mg Oral BID   Continuous Infusions:   Lala Lund, MD [facility] 02/25/2019, 11:36 AM

## 2019-02-25 NOTE — Progress Notes (Addendum)
OT Cancellation Note  Patient Details Name: Derek Foster MRN: [id number] DOB: [birth year]   Cancelled Treatment:    Reason Eval/Treat Not Completed: Patient declined, no reason specified(Just returned to bed and sat in the chair for several hours. Requesting to do therapy tomorrow.)  [place], OTR/L Acute Rehab Pager: [phone] Office: [phone] 02/25/2019, 5:24 PM

## 2019-02-25 NOTE — Plan of Care (Signed)
  Problem: Respiratory: Goal: Will maintain a patent airway Outcome: Progressing Goal: Complications related to the disease process, condition or treatment will be avoided or minimized Outcome: Progressing   

## 2019-02-25 NOTE — TOC Progression Note (Signed)
Transition of Care [facility]) - Progression Note    Patient Details  Name: Derek Foster MRN: [id number] Date of Birth: [birth year]  Transition of Care [facility]) CM/SW [place], LCSW Phone Number: 02/25/2019, 3:08 PM  Clinical Narrative:    Ronney Lion aware patient will be discharging tomorrow if stable. Per [employer], patient's insurance approval expires today and is not able to be re-done until tomorrow morning. CSW was assured that it would not take long to re-generate auth. Weekend CSW to contact [employer] in the morning to initiate.    Expected Discharge Plan: [place] Barriers to Discharge: Continued Medical Work up  Expected Discharge Plan and Services Expected Discharge Plan: [place] In-house Referral: Clinical Social Work     Living arrangements for the past 2 months: Single Family Home                                       Social Determinants of Health (SDOH) Interventions    Readmission Risk Interventions No flowsheet data found.

## 2019-02-26 LAB — CBC WITH DIFFERENTIAL/PLATELET
Abs Immature Granulocytes: 0.09 10*3/uL — ABNORMAL HIGH (ref 0.00–0.07)
Basophils Absolute: 0 10*3/uL (ref 0.0–0.1)
Basophils Relative: 0 %
Eosinophils Absolute: 0.1 10*3/uL (ref 0.0–0.5)
Eosinophils Relative: 2 %
HCT: 33.7 % — ABNORMAL LOW (ref 39.0–52.0)
Hemoglobin: 11 g/dL — ABNORMAL LOW (ref 13.0–17.0)
Immature Granulocytes: 1 %
Lymphocytes Relative: 19 %
Lymphs Abs: 1.5 10*3/uL (ref 0.7–4.0)
MCH: 29.4 pg (ref 26.0–34.0)
MCHC: 32.6 g/dL (ref 30.0–36.0)
MCV: 90.1 fL (ref 80.0–100.0)
Monocytes Absolute: 0.8 10*3/uL (ref 0.1–1.0)
Monocytes Relative: 11 %
Neutro Abs: 5.3 10*3/uL (ref 1.7–7.7)
Neutrophils Relative %: 67 %
Platelets: 276 10*3/uL (ref 150–400)
RBC: 3.74 MIL/uL — ABNORMAL LOW (ref 4.22–5.81)
RDW: 14.6 % (ref 11.5–15.5)
WBC: 7.8 10*3/uL (ref 4.0–10.5)
nRBC: 0 % (ref 0.0–0.2)

## 2019-02-26 LAB — BASIC METABOLIC PANEL
Anion gap: 11 (ref 5–15)
BUN: 17 mg/dL (ref 8–23)
CO2: 23 mmol/L (ref 22–32)
Calcium: 8.4 mg/dL — ABNORMAL LOW (ref 8.9–10.3)
Chloride: 103 mmol/L (ref 98–111)
Creatinine, Ser: 1.5 mg/dL — ABNORMAL HIGH (ref 0.61–1.24)
GFR calc Af Amer: 50 mL/min — ABNORMAL LOW (ref >60)
GFR calc non Af Amer: 43 mL/min — ABNORMAL LOW (ref >60)
Glucose, Bld: 113 mg/dL — ABNORMAL HIGH (ref 70–99)
Potassium: 4 mmol/L (ref 3.5–5.1)
Sodium: 137 mmol/L (ref 135–145)

## 2019-02-26 LAB — C-REACTIVE PROTEIN: CRP: 7.2 mg/dL — ABNORMAL HIGH (ref <1.0)

## 2019-02-26 LAB — MAGNESIUM: Magnesium: 1.8 mg/dL (ref 1.7–2.4)

## 2019-02-26 LAB — TSH: TSH: 0.024 u[IU]/mL — ABNORMAL LOW (ref 0.350–4.500)

## 2019-02-26 LAB — BRAIN NATRIURETIC PEPTIDE: B Natriuretic Peptide: 288.4 pg/mL — ABNORMAL HIGH (ref 0.0–100.0)

## 2019-02-26 MED ORDER — DILTIAZEM HCL ER COATED BEADS 180 MG PO CP24
360.0000 mg | ORAL_CAPSULE | Freq: Every day | ORAL | Status: DC
  Administered 2019-02-27 – 2019-03-12 (×14): 360 mg via ORAL
  Filled 2019-02-26 (×15): qty 2

## 2019-02-26 MED ORDER — DILTIAZEM HCL ER COATED BEADS 180 MG PO CP24
360.0000 mg | ORAL_CAPSULE | Freq: Every day | ORAL | Status: DC
  Administered 2019-02-26: 360 mg via ORAL
  Filled 2019-02-26: qty 2

## 2019-02-26 MED ORDER — METOPROLOL TARTRATE 100 MG PO TABS
200.0000 mg | ORAL_TABLET | Freq: Two times a day (BID) | ORAL | Status: DC
  Administered 2019-02-26 – 2019-03-12 (×26): 200 mg via ORAL
  Filled 2019-02-26 (×29): qty 2

## 2019-02-26 NOTE — Progress Notes (Signed)
Patient ID: UMBERTO EGLE, male   DOB: [birth year], 84 y.o.   MRN: [id number]  PROGRESS NOTE    JAZIAH ROSCHE  [id number] DOB: 1935-12-22 DOA: 02/11/2019 PCP: Celene Squibb, MD   Brief Narrative:  84 year old male with history of CAD status post stent in 2011, hypertension, hyperlipidemia, GERD, hypothyroidism presented with abdominal pain, nausea, vomiting with generalized weakness since his recent Covid diagnosis on 01-21-2020 (patient's wife passed away from COVID-62 2 weeks ago). In the ED, he was noted to be in A. fib with RVR with rates in 140s to 160s and small bowel obstruction.  Cardiology and general surgery were consulted.  Treated with amiodarone drip/Cardizem drip/IV metoprolol and digoxin.  SBO was treated with NG tube and bowel rest.   Subjective:  Patient in bed, appears comfortable, denies any headache, no fever, no chest pain or pressure, no shortness of breath , no abdominal pain. No focal weakness.   Assessment & Plan:   New onset paroxysmal atrial fibrillation/flutter with rapid ventricular response - hard to control rate, [place] vas 2 score of at least 3.  Currently on Eliquis, amiodarone, digoxin, beta-blocker and Cardizem and still in RVR, medications adjusted further on 02/26/2019 for better heart rate control, I think this is primarily being driven by hyperthyroidism, methimazole dose has been increased further.  Will monitor closely.     Hyperthyroidism -TSH is still suppressed with increased T4, have increased methimazole dose. Will recheck TSH in 02/26/2019, Will require outpatient endocrine follow-up.    Small bowel obstruction - Recurrent ventral incisional hernia, likely due to underlying adhesions.  Required NG tube.  Now resolved.  Has been seen by general surgery.   Hypokalemia -Replaced will monitor.  Acute kidney injury -Likely secondary to GI losses.  Improved.  IV fluids discontinued on 02/19/2019, has developed some edema will give a trial of Lasix on 02/24/2019  along with potassium supplementation and monitor.  Thrombocytopenia -Questionable cause.  Resolved.  No signs of bleeding  Leukocytosis -Most likely reactive.  Resolved  Recent COVID-19 infection -Patient reports being diagnosed on 01-21-2023 at his PCP Dr. Josue Hector office completed 3-week isolation period on 1/21, repeat Covid PCR was positive on 1/22, suspect this is residual viral fragments, previous MD had called Dr. Josue Hector office and was able to confirm original positive Covid diagnosis on 01/21/2023.  Stable from the standpoint.    History of CAD  - Remote history of stents, -Stable  Hypertension -Continue on Cardizem and beta-blocker combination.  GERD -Continue oral PPI.  Generalized deconditioning -  PT recommends SNF placement.  Social worker following.     DVT prophylaxis: Eliquis Code Status: DNR Family Communication: Called daughter Malachy Mood on 02/24/2019 at 11:35 AM - message left, message left again on 02/25/2019 at 11:36 AM Disposition Plan: SNF placement once heart rate is well controlled.  Still in RVR. Consultants: Cardiology/general surgery  Procedures: None  Antimicrobials:  None   Objective: Vitals:   02/26/19 0000 02/26/19 0337 02/26/19 0500 02/26/19 0751  BP: (!) 133/48 123/65  (!) 127/56  Pulse: 75 88    Resp: (!) 21 (!) 22  17  Temp:  98.6 F (37 C)  98.6 F (37 C)  TempSrc:  Oral  Oral  SpO2: 92% 93%  95%  Weight:   121.5 kg   Height:        Intake/Output Summary (Last 24 hours) at 02/26/2019 1001 Last data filed at 02/26/2019 0939 Gross per 24 hour  Intake 785 ml  Output 1725 ml  Net -940 ml   Filed Weights   02/21/19 0655 02/25/19 0347 02/26/19 0500  Weight: 125.7 kg 124.7 kg 121.5 kg    Examination:  Awake Alert No new F.N deficits, Normal affect [place].AT,[name]  Supple Neck,No JVD, No cervical lymphadenopathy appriciated.  Symmetrical Chest wall movement, Good air movement bilaterally, CTAB RRR,No Gallops, Rubs or new Murmurs, No  Parasternal Heave +ve B.Sounds, Abd Soft, No tenderness, No organomegaly appriciated, No rebound - guarding or rigidity. No Cyanosis, Clubbing, 1+ edema   Data Reviewed: I have personally reviewed following labs and imaging studies  CBC: Recent Labs  Lab 02/21/19 0304 02/22/19 0214 02/23/19 0440 02/24/19 0316 02/25/19 0534  WBC 8.4 9.4 7.3 7.5 7.2  NEUTROABS  --   --   --  4.7 4.6  HGB 11.2* 10.6* 10.6* 9.9* 10.3*  HCT 32.5* 31.8* 32.0* 30.0* 31.4*  MCV 86.4 88.3 89.9 89.8 90.2  PLT 150 171 185 207 [id number]   Basic Metabolic Panel: Recent Labs  Lab 02/21/19 0304 02/22/19 0214 02/23/19 0440 02/24/19 0316 02/25/19 0534  NA 135 137 137 137 137  K 3.7 3.5 3.6 3.8 3.7  CL 106 104 105 106 105  CO2 23 24 22 23 24   GLUCOSE 112* 118* 113* 117* 113*  BUN 9 12 11 14 14   CREATININE 1.23 1.31* 1.15 1.37* 1.20  CALCIUM 7.8* 7.7* 7.9* 7.9* 8.0*  MG 1.9 1.7 1.7 1.7 1.7   GFR: Estimated Creatinine Clearance: 64.8 mL/min (by C-G formula based on SCr of 1.2 mg/dL). Liver Function Tests: No results for input(s): AST, ALT, ALKPHOS, BILITOT, PROT, ALBUMIN in the last 168 hours. No results for input(s): LIPASE, AMYLASE in the last 168 hours. No results for input(s): AMMONIA in the last 168 hours. Coagulation Profile: No results for input(s): INR, PROTIME in the last 168 hours. Cardiac Enzymes: No results for input(s): CKTOTAL, CKMB, CKMBINDEX, TROPONINI in the last 168 hours. BNP (last 3 results) No results for input(s): PROBNP in the last 8760 hours. HbA1C: No results for input(s): HGBA1C in the last 72 hours. CBG: Recent Labs  Lab 02/21/19 1151  GLUCAP 134*   Lipid Profile: No results for input(s): CHOL, HDL, LDLCALC, TRIG, CHOLHDL, LDLDIRECT in the last 72 hours. Thyroid Function Tests: No results for input(s): TSH, T4TOTAL, FREET4, T3FREE, THYROIDAB in the last 72 hours. Anemia Panel: No results for input(s): VITAMINB12, FOLATE, FERRITIN, TIBC, IRON, RETICCTPCT in the last 72  hours. Sepsis Labs: No results for input(s): PROCALCITON, LATICACIDVEN in the last 168 hours.  No results found for this or any previous visit (from the past 240 hour(s)).     Radiology Studies: No results found.   Scheduled Meds: . amiodarone  200 mg Oral BID  . apixaban  5 mg Oral BID  . digoxin  0.125 mg Oral Daily  . [START ON 02/27/2019] diltiazem  360 mg Oral Daily  . docusate sodium  100 mg Oral BID  . feeding supplement  1 Container Oral BID BM  . feeding supplement (ENSURE ENLIVE)  237 mL Oral Q1500  . guaiFENesin  15 mL Per Tube Q6H  . methimazole  10 mg Oral TID  . metoprolol tartrate  200 mg Oral BID  . multivitamin with minerals  1 tablet Oral Daily  . pantoprazole  40 mg Oral BID   Continuous Infusions:   Lala Lund, MD [facility] 02/26/2019, 10:01 AM

## 2019-02-26 NOTE — TOC Progression Note (Signed)
Transition of Care [facility]) - Progression Note    Patient Details  Name: Derek Foster MRN: [id number] Date of Birth: December 09, 1935  Transition of Care [facility]) CM/SW [place], [place] Phone Number: 02/26/2019, 12:24 PM  Clinical Narrative:    CSW started insurance auth for disposition planning.  CSW waiting for call back from [facility].   Expected Discharge Plan: [place] Barriers to Discharge: Continued Medical Work up  Expected Discharge Plan and Services Expected Discharge Plan: [place] In-house Referral: Clinical Social Work     Living arrangements for the past 2 months: Single Family Home Expected Discharge Date: 02/26/19                                     Social Determinants of Health (SDOH) Interventions    Readmission Risk Interventions No flowsheet data found.

## 2019-02-27 ENCOUNTER — Inpatient Hospital Stay (HOSPITAL_COMMUNITY): Payer: PPO

## 2019-02-27 LAB — CBC WITH DIFFERENTIAL/PLATELET
Abs Immature Granulocytes: 0.05 10*3/uL (ref 0.00–0.07)
Basophils Absolute: 0 10*3/uL (ref 0.0–0.1)
Basophils Relative: 1 %
Eosinophils Absolute: 0.1 10*3/uL (ref 0.0–0.5)
Eosinophils Relative: 1 %
HCT: 29 % — ABNORMAL LOW (ref 39.0–52.0)
Hemoglobin: 9.7 g/dL — ABNORMAL LOW (ref 13.0–17.0)
Immature Granulocytes: 1 %
Lymphocytes Relative: 17 %
Lymphs Abs: 1.1 10*3/uL (ref 0.7–4.0)
MCH: 29.9 pg (ref 26.0–34.0)
MCHC: 33.4 g/dL (ref 30.0–36.0)
MCV: 89.5 fL (ref 80.0–100.0)
Monocytes Absolute: 0.8 10*3/uL (ref 0.1–1.0)
Monocytes Relative: 12 %
Neutro Abs: 4.4 10*3/uL (ref 1.7–7.7)
Neutrophils Relative %: 68 %
Platelets: 221 10*3/uL (ref 150–400)
RBC: 3.24 MIL/uL — ABNORMAL LOW (ref 4.22–5.81)
RDW: 14.4 % (ref 11.5–15.5)
WBC: 6.4 10*3/uL (ref 4.0–10.5)
nRBC: 0 % (ref 0.0–0.2)

## 2019-02-27 LAB — URINALYSIS, ROUTINE W REFLEX MICROSCOPIC
Bilirubin Urine: NEGATIVE
Glucose, UA: NEGATIVE mg/dL
Ketones, ur: NEGATIVE mg/dL
Nitrite: POSITIVE — AB
Protein, ur: 100 mg/dL — AB
RBC / HPF: >50 RBC/hpf — ABNORMAL HIGH (ref 0–5)
Specific Gravity, Urine: 1.012 (ref 1.005–1.030)
WBC, UA: >50 WBC/hpf — ABNORMAL HIGH (ref 0–5)
pH: 9 — ABNORMAL HIGH (ref 5.0–8.0)

## 2019-02-27 LAB — BASIC METABOLIC PANEL
Anion gap: 11 (ref 5–15)
BUN: 18 mg/dL (ref 8–23)
CO2: 23 mmol/L (ref 22–32)
Calcium: 8.1 mg/dL — ABNORMAL LOW (ref 8.9–10.3)
Chloride: 103 mmol/L (ref 98–111)
Creatinine, Ser: 1.31 mg/dL — ABNORMAL HIGH (ref 0.61–1.24)
GFR calc Af Amer: 58 mL/min — ABNORMAL LOW (ref >60)
GFR calc non Af Amer: 50 mL/min — ABNORMAL LOW (ref >60)
Glucose, Bld: 108 mg/dL — ABNORMAL HIGH (ref 70–99)
Potassium: 3.7 mmol/L (ref 3.5–5.1)
Sodium: 137 mmol/L (ref 135–145)

## 2019-02-27 LAB — BRAIN NATRIURETIC PEPTIDE: B Natriuretic Peptide: 364.5 pg/mL — ABNORMAL HIGH (ref 0.0–100.0)

## 2019-02-27 LAB — MAGNESIUM: Magnesium: 1.7 mg/dL (ref 1.7–2.4)

## 2019-02-27 LAB — C-REACTIVE PROTEIN: CRP: 7.3 mg/dL — ABNORMAL HIGH (ref <1.0)

## 2019-02-27 LAB — T4, FREE: Free T4: 2.32 ng/dL — ABNORMAL HIGH (ref 0.61–1.12)

## 2019-02-27 MED ORDER — APIXABAN 5 MG PO TABS: 5.0000 mg | ORAL_TABLET | Freq: Two times a day (BID) | ORAL | Status: DC

## 2019-02-27 MED ORDER — PRAVASTATIN SODIUM 10 MG PO TABS
10.0000 mg | ORAL_TABLET | Freq: Every day | ORAL | Status: DC
  Administered 2019-02-27 – 2019-03-11 (×12): 10 mg via ORAL
  Filled 2019-02-27 (×12): qty 1

## 2019-02-27 MED ORDER — FUROSEMIDE 40 MG PO TABS
40.0000 mg | ORAL_TABLET | Freq: Once | ORAL | Status: AC
  Administered 2019-02-27: 40 mg via ORAL
  Filled 2019-02-27: qty 1

## 2019-02-27 MED ORDER — SODIUM CHLORIDE 0.9 % IV SOLN
1.0000 g | INTRAVENOUS | Status: AC
  Administered 2019-02-27 – 2019-03-03 (×5): 1 g via INTRAVENOUS
  Filled 2019-02-27 (×5): qty 10

## 2019-02-27 MED ORDER — POTASSIUM CHLORIDE CRYS ER 20 MEQ PO TBCR
40.0000 meq | EXTENDED_RELEASE_TABLET | Freq: Once | ORAL | Status: AC
  Administered 2019-02-27: 40 meq via ORAL
  Filled 2019-02-27: qty 2

## 2019-02-27 NOTE — Progress Notes (Signed)
Dr. Silas [place] notified of tele reading A-fib, rate of 78. Patient is asymptomatic.

## 2019-02-27 NOTE — Progress Notes (Signed)
Pt continuing to have bloody urine with more large clots. C/o pain in genital region and pain with urination. Tylenol given. MD notified. Bladder scan ordered. Bladder scan resulted in 0 ml. Will continue to monitor.

## 2019-02-27 NOTE — Plan of Care (Signed)
  Problem: Education: Goal: Knowledge of General Education information will improve Description: Including pain rating scale, medication(s)/side effects and non-pharmacologic comfort measures Outcome: Progressing   Problem: Activity: Goal: Risk for activity intolerance will decrease Outcome: Progressing   Problem: Education: Goal: Knowledge of risk factors and measures for prevention of condition will improve Outcome: Progressing   Problem: Respiratory: Goal: Will maintain a patent airway Outcome: Progressing Goal: Complications related to the disease process, condition or treatment will be avoided or minimized Outcome: Progressing   Problem: Education: Goal: Knowledge of disease or condition will improve Outcome: Progressing Goal: Understanding of medication regimen will improve Outcome: Progressing Goal: Individualized Educational Video(s) Outcome: Progressing   Problem: Activity: Goal: Ability to tolerate increased activity will improve Outcome: Progressing   Problem: Cardiac: Goal: Ability to achieve and maintain adequate cardiopulmonary perfusion will improve Outcome: Progressing   Problem: Health Behavior/Discharge Planning: Goal: Ability to safely manage health-related needs after discharge will improve Outcome: Progressing

## 2019-02-27 NOTE — Progress Notes (Signed)
Patient ID: Derek Foster, male   DOB: 03/06/1935, 84 y.o.   MRN: [id number]  PROGRESS NOTE    Derek Foster  [id number] DOB: 1935-02-03 DOA: 02/11/2019 PCP: Celene Squibb, MD   Brief Narrative:  84 year old male with history of CAD status post stent in 2011, hypertension, hyperlipidemia, GERD, hypothyroidism presented with abdominal pain, nausea, vomiting with generalized weakness since his recent Covid diagnosis on 2020/01/28 (patient's wife passed away from COVID-65 2 weeks ago). In the ED, he was noted to be in A. fib with RVR with rates in 140s to 160s and small bowel obstruction.  Cardiology and general surgery were consulted.  Treated with amiodarone drip/Cardizem drip/IV metoprolol and digoxin.  SBO was treated with NG tube and bowel rest.   Subjective:  Patient in bed, denies any headache chest or abdominal pain, complains of blood in urine today.  Assessment & Plan:   New onset paroxysmal atrial fibrillation/flutter with rapid ventricular response - hard to control rate, [place] vas 2 score of at least 3.  Currently on Eliquis, amiodarone, digoxin, beta-blocker and Cardizem and still in RVR, medications adjusted further on 02/26/2019 for better heart rate control, I think this is primarily being driven by hyperthyroidism, methimazole dose has been increased further. RVR is improving.   Hyperthyroidism -TSH is still suppressed with increased T4, have increased methimazole dose.  Will require outpatient endocrine follow-up. Repeat TSH along with free T4 and T3 on 02/28/2019.   Lab Results  Component Value Date   TSH 0.024 (L) 02/26/2019    New onset hematuria. Skip Eliquis next 2 doses, check renal ultrasound, also UA suggestive of possible UTI, IV Rocephin, urine culture. Monitor.  Small bowel obstruction - Recurrent ventral incisional hernia, likely due to underlying adhesions.  Required NG tube.  Now resolved.  Has been seen by general surgery.   Hypokalemia -Replaced will  monitor.  Acute kidney injury -Likely secondary to GI losses.  Improved.  IV fluids discontinued on 02/19/2019.  Thrombocytopenia -Questionable cause.  Resolved.  No signs of bleeding  Leukocytosis -Most likely reactive.  Resolved  Recent COVID-19 infection -Patient reports being diagnosed on January 28, 2023 at his PCP Dr. Josue Hector office completed 3-week isolation period on 1/21, repeat Covid PCR was positive on 1/22, suspect this is residual viral fragments, previous MD had called Dr. Josue Hector office and was able to confirm original positive Covid diagnosis on 2023-01-28.  Stable from the standpoint.   History of CAD  - Remote history of stents, -Stable  Hypertension -Continue on Cardizem and beta-blocker combination.  GERD -Continue oral PPI.  Generalized deconditioning -  PT recommends SNF placement.  Social worker following.     DVT prophylaxis: Eliquis Code Status: DNR Family Communication: Called daughter Malachy Mood on 02/24/2019 at 11:35 AM - message left, message left again on 02/25/2019 at 11:36 AM Disposition Plan: SNF placement once heart rate is well controlled. RVR controlled but has now developed hematuria which is being worked up, Consultants: Cardiology/general surgery  Procedures: None  Antimicrobials:  None   Objective: Vitals:   02/26/19 2345 02/27/19 0320 02/27/19 0655 02/27/19 0732  BP: (!) 129/55 (!) 142/57  (!) 141/75  Pulse: 87 89  93  Resp: (!) 21 (!) 21  20  Temp: 98.8 F (37.1 C) 98.2 F (36.8 C)  99 F (37.2 C)  TempSrc: Oral Oral  Oral  SpO2: 94% 94%  92%  Weight:   121.1 kg   Height:        Intake/Output  Summary (Last 24 hours) at 02/27/2019 1111 Last data filed at 02/27/2019 0900 Gross per 24 hour  Intake 600 ml  Output 400 ml  Net 200 ml   Filed Weights   02/25/19 0347 02/26/19 0500 02/27/19 0655  Weight: 124.7 kg 121.5 kg 121.1 kg    Examination:  Awake Alert,  No new F.N deficits, Normal affect [place].AT,[name] Supple Neck,No JVD, No cervical  lymphadenopathy appriciated.  Symmetrical Chest wall movement, Good air movement bilaterally, CTAB iRRR,No Gallops, Rubs or new Murmurs, No Parasternal Heave +ve B.Sounds, Abd Soft, No tenderness, No organomegaly appriciated, No rebound - guarding or rigidity. No Cyanosis, Clubbing or edema, No new Rash or bruise    Data Reviewed: I have personally reviewed following labs and imaging studies  CBC: Recent Labs  Lab 02/23/19 0440 02/24/19 0316 02/25/19 0534 02/26/19 0847 02/27/19 0334  WBC 7.3 7.5 7.2 7.8 6.4  NEUTROABS  --  4.7 4.6 5.3 4.4  HGB 10.6* 9.9* 10.3* 11.0* 9.7*  HCT 32.0* 30.0* 31.4* 33.7* 29.0*  MCV 89.9 89.8 90.2 90.1 89.5  PLT 185 207 221 276 [id number]   Basic Metabolic Panel: Recent Labs  Lab 02/23/19 0440 02/24/19 0316 02/25/19 0534 02/26/19 0847 02/27/19 0334  NA 137 137 137 137 137  K 3.6 3.8 3.7 4.0 3.7  CL 105 106 105 103 103  CO2 22 23 24 23 23   GLUCOSE 113* 117* 113* 113* 108*  BUN 11 14 14 17 18   CREATININE 1.15 1.37* 1.20 1.50* 1.31*  CALCIUM 7.9* 7.9* 8.0* 8.4* 8.1*  MG 1.7 1.7 1.7 1.8 1.7   GFR: Estimated Creatinine Clearance: 59.3 mL/min (A) (by C-G formula based on SCr of 1.31 mg/dL (H)). Liver Function Tests: No results for input(s): AST, ALT, ALKPHOS, BILITOT, PROT, ALBUMIN in the last 168 hours. No results for input(s): LIPASE, AMYLASE in the last 168 hours. No results for input(s): AMMONIA in the last 168 hours. Coagulation Profile: No results for input(s): INR, PROTIME in the last 168 hours. Cardiac Enzymes: No results for input(s): CKTOTAL, CKMB, CKMBINDEX, TROPONINI in the last 168 hours. BNP (last 3 results) No results for input(s): PROBNP in the last 8760 hours. HbA1C: No results for input(s): HGBA1C in the last 72 hours. CBG: Recent Labs  Lab 02/21/19 1151  GLUCAP 134*   Lipid Profile: No results for input(s): CHOL, HDL, LDLCALC, TRIG, CHOLHDL, LDLDIRECT in the last 72 hours. Thyroid Function Tests: Recent Labs     02/26/19 0847  TSH 0.024*   Anemia Panel: No results for input(s): VITAMINB12, FOLATE, FERRITIN, TIBC, IRON, RETICCTPCT in the last 72 hours. Sepsis Labs: No results for input(s): PROCALCITON, LATICACIDVEN in the last 168 hours.  No results found for this or any previous visit (from the past 240 hour(s)).     Radiology Studies: No results found.   Scheduled Meds: . amiodarone  200 mg Oral BID  . apixaban  5 mg Oral BID  . digoxin  0.125 mg Oral Daily  . diltiazem  360 mg Oral Daily  . docusate sodium  100 mg Oral BID  . feeding supplement  1 Container Oral BID BM  . feeding supplement (ENSURE ENLIVE)  237 mL Oral Q1500  . guaiFENesin  15 mL Per Tube Q6H  . methimazole  10 mg Oral TID  . metoprolol tartrate  200 mg Oral BID  . multivitamin with minerals  1 tablet Oral Daily  . pantoprazole  40 mg Oral BID   Continuous Infusions: . cefTRIAXone (ROCEPHIN)  IV  Lala Lund, MD [facility] 02/27/2019, 11:11 AM

## 2019-02-27 NOTE — Progress Notes (Signed)
Pt found to have bloody urine this am w/ a clot. MD notified. UA sample obtained.

## 2019-02-28 LAB — TSH: TSH: 0.012 u[IU]/mL — ABNORMAL LOW (ref 0.350–4.500)

## 2019-02-28 MED ORDER — APIXABAN 5 MG PO TABS
5.0000 mg | ORAL_TABLET | Freq: Two times a day (BID) | ORAL | Status: DC
  Filled 2019-02-28: qty 1

## 2019-02-28 MED ORDER — HYDROCODONE-ACETAMINOPHEN 5-325 MG PO TABS
1.0000 | ORAL_TABLET | Freq: Four times a day (QID) | ORAL | Status: DC | PRN
  Administered 2019-02-28 – 2019-03-06 (×6): 2 via ORAL
  Filled 2019-02-28 (×7): qty 2

## 2019-02-28 MED ORDER — MAGNESIUM SULFATE IN D5W 1-5 GM/100ML-% IV SOLN
1.0000 g | Freq: Once | INTRAVENOUS | Status: AC
  Administered 2019-02-28: 1 g via INTRAVENOUS
  Filled 2019-02-28 (×2): qty 100

## 2019-02-28 MED ORDER — METHIMAZOLE 10 MG PO TABS
20.0000 mg | ORAL_TABLET | Freq: Three times a day (TID) | ORAL | Status: DC
  Administered 2019-02-28 – 2019-03-12 (×36): 20 mg via ORAL
  Filled 2019-02-28: qty 2
  Filled 2019-02-28 (×2): qty 4
  Filled 2019-02-28 (×2): qty 2
  Filled 2019-02-28: qty 4
  Filled 2019-02-28 (×4): qty 2
  Filled 2019-02-28 (×3): qty 4
  Filled 2019-02-28 (×3): qty 2
  Filled 2019-02-28: qty 4
  Filled 2019-02-28: qty 2
  Filled 2019-02-28 (×2): qty 4
  Filled 2019-02-28 (×4): qty 2
  Filled 2019-02-28: qty 4
  Filled 2019-02-28 (×3): qty 2
  Filled 2019-02-28 (×2): qty 4
  Filled 2019-02-28 (×2): qty 2
  Filled 2019-02-28 (×7): qty 4

## 2019-02-28 MED ORDER — TAMSULOSIN HCL 0.4 MG PO CAPS
0.4000 mg | ORAL_CAPSULE | Freq: Every day | ORAL | Status: DC
  Administered 2019-02-28 – 2019-03-10 (×11): 0.4 mg via ORAL
  Filled 2019-02-28 (×11): qty 1

## 2019-02-28 MED ORDER — CHLORHEXIDINE GLUCONATE CLOTH 2 % EX PADS
6.0000 | MEDICATED_PAD | Freq: Every day | CUTANEOUS | Status: DC
  Administered 2019-02-28 – 2019-03-11 (×12): 6 via TOPICAL

## 2019-02-28 NOTE — Progress Notes (Signed)
Foley inserted with Elisa, RN after scanning bladder finding 356ml.  Output of 611ml which includes the 188ml flush.  After insertion 582ml urine output documented, dark red with some sediment.  Patient tolerated well and stated feeling relief.

## 2019-02-28 NOTE — Progress Notes (Signed)
OT Cancellation Note  Patient Details Name: Derek Foster MRN: [id number] DOB: [birth year]   Cancelled Treatment:    Reason Eval/Treat Not Completed: Other (comment); pt reports not feeling well, has been nauseous and didn't sleep well last night due to pain. Declined working with therapy. Will follow up for OT treatment as able.  Lou Cal, OT [facility] Pager [phone] Office [phone]    Raymondo Band 02/28/2019, 4:56 PM

## 2019-02-28 NOTE — Progress Notes (Signed)
Patient ID: Derek Foster, male   DOB: Jul 02, 1935, 84 y.o.   MRN: [id number]  PROGRESS NOTE    Derek Foster  [id number] DOB: 12-16-1935 DOA: 02/11/2019 PCP: Celene Squibb, MD   Brief Narrative:  84 year old male with history of CAD status post stent in 2011, hypertension, hyperlipidemia, GERD, hypothyroidism presented with abdominal pain, nausea, vomiting with generalized weakness since his recent Covid diagnosis on Jan 20, 2020 (patient's wife passed away from COVID-25 2 weeks ago). In the ED, he was noted to be in A. fib with RVR with rates in 140s to 160s and small bowel obstruction.  Cardiology and general surgery were consulted.  Treated with amiodarone drip/Cardizem drip/IV metoprolol and digoxin.  SBO was treated with NG tube and bowel rest.   Subjective:  Patient in bed, denies any chest pain or shortness of breath does have suprapubic discomfort and pain, unable to pass urine.  Assessment & Plan:   New onset paroxysmal atrial fibrillation/flutter with rapid ventricular response - hard to control rate, [place] vas 2 score of at least 3.  Currently on Eliquis, amiodarone, digoxin, beta-blocker and Cardizem and still in RVR, medications adjusted further on 02/26/2019 for better heart rate control, I think this is primarily being driven by hyperthyroidism, methimazole dose has been increased further. RVR is improving.   Hyperthyroidism -TSH is still suppressed with increased T4, have increased methimazole dose.  Will require outpatient endocrine follow-up. Repeat TSH along with free T4 and T3 on 02/28/2019.   Lab Results  Component Value Date   TSH 0.012 (L) 02/28/2019    New onset hematuria.  To hold Eliquis on 02/28/2019, he has now bladder outlet obstruction due to clots, case was discussed with urologist Dr. Louis Meckel on 02/27/2019, plan is Foley catheter placement, frequently flush Foley catheter to avoid clogging, placed on Flomax, will monitor will require outpatient urology  follow-up.   Small bowel obstruction - Recurrent ventral incisional hernia, likely due to underlying adhesions.  Required NG tube.  Now resolved.  Has been seen by general surgery.   Hypokalemia -replaced and stable.  Acute kidney injury -resolved after hydration with IV fluids we will continue to monitor.  Thrombocytopenia -resolved.  Leukocytosis -Most likely reactive.  Resolved  Recent COVID-19 infection -Patient reports being diagnosed on 01/20/2023 at his PCP Dr. Josue Hector office completed 3-week isolation period on 1/21, repeat Covid PCR was positive on 1/22, suspect this is residual viral fragments, previous MD had called Dr. Josue Hector office and was able to confirm original positive Covid diagnosis on 01/20/2023.  Stable from the standpoint.   History of CAD  - Remote history of stents, -Stable  Hypertension -Continue on Cardizem and beta-blocker combination.  GERD -Continue oral PPI.  Generalized deconditioning -  PT recommends SNF placement.  Social worker following.     DVT prophylaxis: Eliquis Code Status: DNR Family Communication: Called daughter Malachy Mood on 02/24/2019 at 11:35 AM - message left, message left again on 02/25/2019 at 11:36 AM Disposition Plan: SNF placement once heart rate is well controlled.  Heart rate is better controlled but now has developed hematuria with bladder outlet obstruction. Consultants: Cardiology/general surgery  Procedures: None  Antimicrobials:  None   Objective: Vitals:   02/28/19 0359 02/28/19 0402 02/28/19 0433 02/28/19 0914  BP:  (!) 151/77    Pulse:  (!) 117  92  Resp:  19    Temp: 98.6 F (37 C)     TempSrc: Oral     SpO2:  94%  Weight:   121.7 kg   Height:        Intake/Output Summary (Last 24 hours) at 02/28/2019 1041 Last data filed at 02/28/2019 0845 Gross per 24 hour  Intake 1065 ml  Output 2165 ml  Net -1100 ml   Filed Weights   02/27/19 0655 02/27/19 2245 02/28/19 0433  Weight: 121.1 kg 121.7 kg 121.7 kg     Examination:  Awake Alert, No new F.N deficits, Normal affect [name],[name] Supple Neck,No JVD, No cervical lymphadenopathy appriciated.  Symmetrical Chest wall movement, Good air movement bilaterally, CTAB RRR,No Gallops, Rubs or new Murmurs, No Parasternal Heave +ve B.Sounds, Abd Soft, suprapubic fullness and tenderness on exam, No Cyanosis, Clubbing or edema, No new Rash or bruise   Data Reviewed: I have personally reviewed following labs and imaging studies  CBC: Recent Labs  Lab 02/23/19 0440 02/24/19 0316 02/25/19 0534 02/26/19 0847 02/27/19 0334  WBC 7.3 7.5 7.2 7.8 6.4  NEUTROABS  --  4.7 4.6 5.3 4.4  HGB 10.6* 9.9* 10.3* 11.0* 9.7*  HCT 32.0* 30.0* 31.4* 33.7* 29.0*  MCV 89.9 89.8 90.2 90.1 89.5  PLT 185 207 221 276 [id number]   Basic Metabolic Panel: Recent Labs  Lab 02/23/19 0440 02/24/19 0316 02/25/19 0534 02/26/19 0847 02/27/19 0334  NA 137 137 137 137 137  K 3.6 3.8 3.7 4.0 3.7  CL 105 106 105 103 103  CO2 22 23 24 23 23   GLUCOSE 113* 117* 113* 113* 108*  BUN 11 14 14 17 18   CREATININE 1.15 1.37* 1.20 1.50* 1.31*  CALCIUM 7.9* 7.9* 8.0* 8.4* 8.1*  MG 1.7 1.7 1.7 1.8 1.7   GFR: Estimated Creatinine Clearance: 59.4 mL/min (A) (by C-G formula based on SCr of 1.31 mg/dL (H)). Liver Function Tests: No results for input(s): AST, ALT, ALKPHOS, BILITOT, PROT, ALBUMIN in the last 168 hours. No results for input(s): LIPASE, AMYLASE in the last 168 hours. No results for input(s): AMMONIA in the last 168 hours. Coagulation Profile: No results for input(s): INR, PROTIME in the last 168 hours. Cardiac Enzymes: No results for input(s): CKTOTAL, CKMB, CKMBINDEX, TROPONINI in the last 168 hours. BNP (last 3 results) No results for input(s): PROBNP in the last 8760 hours. HbA1C: No results for input(s): HGBA1C in the last 72 hours. CBG: Recent Labs  Lab 02/21/19 1151  GLUCAP 134*   Lipid Profile: No results for input(s): CHOL, HDL, LDLCALC, TRIG, CHOLHDL,  LDLDIRECT in the last 72 hours. Thyroid Function Tests: Recent Labs    02/26/19 0847 02/27/19 0334 02/28/19 0327  TSH   < >  --  0.012*  FREET4  --  2.32*  --    < > = values in this interval not displayed.   Anemia Panel: No results for input(s): VITAMINB12, FOLATE, FERRITIN, TIBC, IRON, RETICCTPCT in the last 72 hours. Sepsis Labs: No results for input(s): PROCALCITON, LATICACIDVEN in the last 168 hours.  No results found for this or any previous visit (from the past 240 hour(s)).     Radiology Studies: US RENAL  Result Date: 02/27/2019 CLINICAL DATA:  46 year old with acute kidney injury and hematuria. EXAM: RENAL / URINARY TRACT ULTRASOUND COMPLETE COMPARISON:  No prior ultrasound. Unenhanced CT abdomen and pelvis 02/12/2019 is correlated. FINDINGS: Right Kidney: Renal measurements: Approximately 10.3 x 5.7 x 5.7 cm = volume: 176 mL. Normal parenchymal echotexture. Mild diffuse cortical thinning. No hydronephrosis. No parenchymal masses. No visible shadowing calculi. Left Kidney: Renal measurements: Approximately 12.8 x 7.0 x 8.6 cm = volume:  402 mL. Normal parenchymal echotexture. Mild diffuse cortical thinning. No hydronephrosis. No parenchymal masses. No visible shadowing calculi. Bladder: Obscured by overlying bowel gas. Other: None. IMPRESSION: 1. No evidence of hydronephrosis involving either kidney to suggest urinary tract obstruction. 2. Mild diffuse cortical thinning involving both kidneys. No focal abnormality involving either kidney and no visible shadowing calculi. Electronically Signed   By: Evangeline Dakin M.D.   On: 02/27/2019 14:01     Scheduled Meds: . amiodarone  200 mg Oral BID  . [START ON 03/01/2019] apixaban  5 mg Oral BID  . Chlorhexidine Gluconate Cloth  6 each Topical Daily  . digoxin  0.125 mg Oral Daily  . diltiazem  360 mg Oral Daily  . docusate sodium  100 mg Oral BID  . feeding supplement  1 Container Oral BID BM  . feeding supplement (ENSURE ENLIVE)   237 mL Oral Q1500  . guaiFENesin  15 mL Per Tube Q6H  . methimazole  20 mg Oral TID  . metoprolol tartrate  200 mg Oral BID  . multivitamin with minerals  1 tablet Oral Daily  . pantoprazole  40 mg Oral BID  . pravastatin  10 mg Oral q1800  . tamsulosin  0.4 mg Oral Daily   Continuous Infusions: . cefTRIAXone (ROCEPHIN)  IV 1 g (02/27/19 1211)    Lala Lund, MD [facility] 02/28/2019, 10:41 AM

## 2019-02-28 NOTE — Plan of Care (Signed)
  Problem: Education: Goal: Knowledge of General Education information will improve Description: Including pain rating scale, medication(s)/side effects and non-pharmacologic comfort measures Outcome: Progressing   Problem: Activity: Goal: Risk for activity intolerance will decrease Outcome: Progressing   Problem: Education: Goal: Knowledge of risk factors and measures for prevention of condition will improve Outcome: Progressing   Problem: Respiratory: Goal: Will maintain a patent airway Outcome: Progressing Goal: Complications related to the disease process, condition or treatment will be avoided or minimized Outcome: Progressing   Problem: Education: Goal: Knowledge of disease or condition will improve Outcome: Progressing Goal: Understanding of medication regimen will improve Outcome: Progressing Goal: Individualized Educational Video(s) Outcome: Progressing   Problem: Activity: Goal: Ability to tolerate increased activity will improve Outcome: Progressing   Problem: Cardiac: Goal: Ability to achieve and maintain adequate cardiopulmonary perfusion will improve Outcome: Progressing   Problem: Health Behavior/Discharge Planning: Goal: Ability to safely manage health-related needs after discharge will improve Outcome: Progressing

## 2019-02-28 NOTE — Progress Notes (Signed)
Dr. Silas [place] notifies of bloody urine out of 200. Ordered to continue to monitor and Vicodin ordered for pain control.

## 2019-02-28 NOTE — Progress Notes (Signed)
PT Cancellation Note  Patient Details Name: Derek Foster MRN: [id number] DOB: April 05, 1935   Cancelled Treatment:    Reason Eval/Treat Not Completed: Medical issues which prohibited therapy(Pt too nauseated to work with PT.  HR 77 bpm at rest.  )   Denice Paradise 02/28/2019, 12:29 PM Anothony Bursch W,PT [place] Pager:  [phone]  Office:  [phone]

## 2019-02-28 NOTE — Progress Notes (Signed)
Foley placed around 0830 this morning, patient tolerated well. Urine still dark red and bloody with some sediment. Foley and peri care performed. Patient states his pain feels much better after foley insertion but has some nausea. Zofran given and effective. Patient's daughter Malachy Mood updated.  Received call from tele that patient had a 6 beat run of v-tach. Order received for IV magnesium.

## 2019-03-01 LAB — COMPREHENSIVE METABOLIC PANEL
ALT: 36 U/L (ref 0–44)
AST: 24 U/L (ref 15–41)
Albumin: 2 g/dL — ABNORMAL LOW (ref 3.5–5.0)
Alkaline Phosphatase: 123 U/L (ref 38–126)
Anion gap: 10 (ref 5–15)
BUN: 20 mg/dL (ref 8–23)
CO2: 23 mmol/L (ref 22–32)
Calcium: 8 mg/dL — ABNORMAL LOW (ref 8.9–10.3)
Chloride: 101 mmol/L (ref 98–111)
Creatinine, Ser: 1.6 mg/dL — ABNORMAL HIGH (ref 0.61–1.24)
GFR calc Af Amer: 46 mL/min — ABNORMAL LOW (ref >60)
GFR calc non Af Amer: 40 mL/min — ABNORMAL LOW (ref >60)
Glucose, Bld: 114 mg/dL — ABNORMAL HIGH (ref 70–99)
Potassium: 3.9 mmol/L (ref 3.5–5.1)
Sodium: 134 mmol/L — ABNORMAL LOW (ref 135–145)
Total Bilirubin: 0.6 mg/dL (ref 0.3–1.2)
Total Protein: 5.3 g/dL — ABNORMAL LOW (ref 6.5–8.1)

## 2019-03-01 LAB — MAGNESIUM: Magnesium: 1.9 mg/dL (ref 1.7–2.4)

## 2019-03-01 LAB — CBC WITH DIFFERENTIAL/PLATELET
Abs Immature Granulocytes: 0.06 10*3/uL (ref 0.00–0.07)
Basophils Absolute: 0 10*3/uL (ref 0.0–0.1)
Basophils Relative: 0 %
Eosinophils Absolute: 0.1 10*3/uL (ref 0.0–0.5)
Eosinophils Relative: 1 %
HCT: 28.8 % — ABNORMAL LOW (ref 39.0–52.0)
Hemoglobin: 9.6 g/dL — ABNORMAL LOW (ref 13.0–17.0)
Immature Granulocytes: 1 %
Lymphocytes Relative: 16 %
Lymphs Abs: 1.4 10*3/uL (ref 0.7–4.0)
MCH: 29.8 pg (ref 26.0–34.0)
MCHC: 33.3 g/dL (ref 30.0–36.0)
MCV: 89.4 fL (ref 80.0–100.0)
Monocytes Absolute: 0.8 10*3/uL (ref 0.1–1.0)
Monocytes Relative: 10 %
Neutro Abs: 6.3 10*3/uL (ref 1.7–7.7)
Neutrophils Relative %: 72 %
Platelets: 232 10*3/uL (ref 150–400)
RBC: 3.22 MIL/uL — ABNORMAL LOW (ref 4.22–5.81)
RDW: 14.2 % (ref 11.5–15.5)
WBC: 8.6 10*3/uL (ref 4.0–10.5)
nRBC: 0 % (ref 0.0–0.2)

## 2019-03-01 LAB — D-DIMER, QUANTITATIVE: D-Dimer, Quant: 1.66 ug/mL-FEU — ABNORMAL HIGH (ref 0.00–0.50)

## 2019-03-01 LAB — T3: T3, Total: 87 ng/dL (ref 71–180)

## 2019-03-01 LAB — BRAIN NATRIURETIC PEPTIDE: B Natriuretic Peptide: 275.8 pg/mL — ABNORMAL HIGH (ref 0.0–100.0)

## 2019-03-01 MED ORDER — APIXABAN 5 MG PO TABS
5.0000 mg | ORAL_TABLET | Freq: Two times a day (BID) | ORAL | Status: DC
  Filled 2019-03-01: qty 1

## 2019-03-01 MED ORDER — LACTATED RINGERS IV SOLN: INTRAVENOUS | Status: AC

## 2019-03-01 NOTE — Plan of Care (Signed)
  Problem: Education: Goal: Knowledge of General Education information will improve Description: Including pain rating scale, medication(s)/side effects and non-pharmacologic comfort measures Outcome: Progressing   Problem: Activity: Goal: Risk for activity intolerance will decrease Outcome: Progressing   Problem: Education: Goal: Knowledge of risk factors and measures for prevention of condition will improve Outcome: Progressing   Problem: Respiratory: Goal: Will maintain a patent airway Outcome: Progressing Goal: Complications related to the disease process, condition or treatment will be avoided or minimized Outcome: Progressing   Problem: Education: Goal: Knowledge of disease or condition will improve Outcome: Progressing Goal: Understanding of medication regimen will improve Outcome: Progressing Goal: Individualized Educational Video(s) Outcome: Progressing   Problem: Activity: Goal: Ability to tolerate increased activity will improve Outcome: Progressing   Problem: Cardiac: Goal: Ability to achieve and maintain adequate cardiopulmonary perfusion will improve Outcome: Progressing   Problem: Health Behavior/Discharge Planning: Goal: Ability to safely manage health-related needs after discharge will improve Outcome: Progressing

## 2019-03-01 NOTE — TOC Progression Note (Signed)
Transition of Care [facility]) - Progression Note    Patient Details  Name: Derek Foster MRN: [id number] Date of Birth: 11/17/1935  Transition of [facility]) CM/SW [place], LCSW Phone Number: 03/01/2019, 10:29 AM  Clinical Narrative:    [facility] good through 2/12 at 5pm.    Expected Discharge Plan: [place] Barriers to Discharge: Continued Medical Work up  Expected Discharge Plan and Services Expected Discharge Plan: [place] In-house Referral: Clinical Social Work     Living arrangements for the past 2 months: Single Family Home Expected Discharge Date: 02/26/19                                     Social Determinants of Health (SDOH) Interventions    Readmission Risk Interventions No flowsheet data found.

## 2019-03-01 NOTE — Progress Notes (Signed)
Spoke with patient's daughter-in-law, Malachy Mood. Update given and all questions answered.

## 2019-03-01 NOTE — Progress Notes (Signed)
Physical Therapy Treatment Patient Details Name: Derek Foster MRN: [id number] DOB: 10-Feb-1935 Today's Date: 03/01/2019    History of Present Illness 84 year old male with history of CAD status post stent in 2011, hypertension, hyperlipidemia, GERD, hypothyroidism presented with abdominal pain, nausea, vomiting with generalized weakness since his recent Covid diagnosis on February 13, 2020 (patient's wife passed away from COVID-19 2 weeks ago).In the ED, he was noted to be in A. fib with RVR with rates in 140s to 160s and small bowel obstruction.  Cardiology and general surgery were consulted.    PT Comments    Patient with multiple missed therapy sessions due to medical issues over the past week. He has shown a slight decline in his strength and today required min assist (with use of momentum) for sit to stand (from recliner with pillow in seat and from elevated EOB). His legs were much more fatigued with walking today and required seated rest on return from door to recliner. Educated on exercises he can work on between [employer]. Discussed with OT trying to alternate PT/OT days so that pt gets some therapy most days.    Follow Up Recommendations  SNF;Supervision/Assistance - 24 hour(needs some assist and reports 24 hour assist not available.)     Equipment Recommendations  None recommended by PT    Recommendations for Other Services       Precautions / Restrictions Precautions Precautions: Fall Precaution Comments: watch HR Restrictions Weight Bearing Restrictions: No    Mobility  Bed Mobility               General bed mobility comments: in chair on arrival  Transfers Overall transfer level: Needs assistance Equipment used: Rolling walker (2 wheeled) Transfers: Sit to/from [facility] Sit to Stand: Min assist         General transfer comment: unable to stand without assist (even with attempt to use momentum). Min assist primarily for coming forward over  his feet/BOS and then pt slowly can elevate torso and reach for walker; x 2 reps  Ambulation/Gait Ambulation/Gait assistance: Min guard Gait Distance (Feet): 40 Feet Assistive device: Rolling walker (2 wheeled) Gait Pattern/deviations: Step-to pattern;Step-through pattern;Decreased stride length;Shuffle;Trunk flexed     General Gait Details: cues for walker safety to stay in close proximity to RW; pt required several attempts to maneuver RW through narrow space, but ultimately he was able to do this without imbalance or assist to RW; vc throughout to judge when he needed to turn around, but pt determined to walk to the door. On return to recliner, he needed to sit and rest on the foot of the bed.    Stairs             Wheelchair Mobility    Modified Rankin (Stroke Patients Only)       Balance Overall balance assessment: Needs assistance Sitting-balance support: Feet supported;No upper extremity supported Sitting balance-Leahy Scale: Good     Standing balance support: Bilateral upper extremity supported Standing balance-Leahy Scale: Poor Standing balance comment: UE support for balance due to weakness/ flexed posture                            Cognition Arousal/Alertness: Awake/alert Behavior During Therapy: WFL for tasks assessed/performed Overall Cognitive Status: Within Functional Limits for tasks assessed  General Comments: for basic functional tasks, not formally assessed      Exercises General Exercises - Upper Extremity Elbow Extension: Strengthening;Both;5 reps;Seated;Theraband Theraband Level (Elbow Extension): Level 1 (Yellow) General Exercises - Lower Extremity Ankle Circles/Pumps: AROM;Both;Seated Quad Sets: AROM;Both;Seated;10 reps Gluteal Sets: Other (comment)(instructed, did not perform) Long Arc Quad: AROM;Both;Seated;5 reps Hip ABduction/ADduction: AROM;Both;10 reps;Seated(feet elevated by  recliner)    General Comments General comments (skin integrity, edema, etc.): sats 97% on room air pre- and post-ambulation; HR 118 after walking      Pertinent Vitals/Pain Pain Assessment: Faces Faces Pain Scale: Hurts a little bit Pain Location: bottom from sitting Pain Descriptors / Indicators: Discomfort;Sore Pain Intervention(s): Repositioned(pillow in seat; taught glute sets )    Home Living                      Prior Function            PT Goals (current goals can now be found in the care plan section) Acute Rehab PT Goals Patient Stated Goal: to get stronger Time For Goal Achievement: 03/04/19 Potential to Achieve Goals: Fair Progress towards PT goals: Not progressing toward goals - comment(multiple medical issues resulted in many cancellations)    Frequency    Min 3X/week(functional decline has occurred with missed sessions)      PT Plan Current plan remains appropriate    Co-evaluation              AM-PAC PT "6 Clicks" Mobility   Outcome Measure  Help needed turning from your back to your side while in a flat bed without using bedrails?: None Help needed moving from lying on your back to sitting on the side of a flat bed without using bedrails?: A Little Help needed moving to and from a bed to a chair (including a wheelchair)?: A Little Help needed standing up from a chair using your arms (e.g., wheelchair or bedside chair)?: A Little Help needed to walk in hospital room?: A Little Help needed climbing 3-5 steps with a railing? : A Lot 6 Click Score: 18    End of Session   Activity Tolerance: Patient limited by fatigue Patient left: in chair;with call bell/phone within reach;with chair alarm set;with nursing/sitter in room Nurse Communication: Mobility status PT Visit Diagnosis: Other abnormalities of gait and mobility (R26.89);Difficulty in walking, not elsewhere classified (R26.2);Muscle weakness (generalized) (M62.81)     Time:  [id number] PT Time Calculation (min) (ACUTE ONLY): 30 min  Charges:  $Gait Training: 8-22 mins $Therapeutic Exercise: 8-22 mins                      Arby Barrette, PT Pager [phone]    Rexanne Mano 03/01/2019, 11:53 AM

## 2019-03-01 NOTE — Progress Notes (Signed)
Urinary catheter flushed with NS per order.

## 2019-03-01 NOTE — Progress Notes (Signed)
Patient ID: Derek Foster, male   DOB: 07-08-1935, 84 y.o.   MRN: [id number]  PROGRESS NOTE    TAVIER NEWBORN  [id number] DOB: Jul 05, 1935 DOA: 02/11/2019 PCP: Celene Squibb, MD   Brief Narrative:  84 year old male with history of CAD status post stent in 2011, hypertension, hyperlipidemia, GERD, hypothyroidism presented with abdominal pain, nausea, vomiting with generalized weakness since his recent Covid diagnosis on 2020-01-30 (patient's wife passed away from COVID-41 2 weeks ago). In the ED, he was noted to be in A. fib with RVR with rates in 140s to 160s and small bowel obstruction.  Cardiology and general surgery were consulted.  Treated with amiodarone drip/Cardizem drip/IV metoprolol and digoxin.  SBO was treated with NG tube and bowel rest.   Subjective: Patient in bed denies any headache chest or abdominal pain, suprapubic discomfort has completely resolved, passing urine freely now in the Foley catheter.  Assessment & Plan:   New onset paroxysmal atrial fibrillation/flutter with rapid ventricular response - hard to control rate, [place] vas 2 score of at least 3.  Currently on Eliquis, amiodarone, digoxin, beta-blocker and Cardizem and still in RVR, medications adjusted further on 02/26/2019 for better heart rate control, I think this is primarily being driven by hyperthyroidism, methimazole dose has been increased further. RVR is improving.   Hyperthyroidism -TSH is still suppressed with increased T4, have increased methimazole dose.  Will require outpatient endocrine follow-up. Repeat TSH along with free T4 and T3 in 2 weeks time with outpatient endocrine follow-up.   Lab Results  Component Value Date   TSH 0.012 (L) 02/28/2019    New onset hematuria outlet obstruction.  Will hold Eliquis till 03/02/2019, Foley catheter with regular flushing has resolved his bladder outlet obstruction, Foley catheter being flushed every 4 hours, urine is clearing up, case was discussed with urologist Dr.  Louis Meckel on 02/27/2019, he has also been placed on Flomax, will monitor will require outpatient urology follow-up.   Small bowel obstruction - Recurrent ventral incisional hernia, likely due to underlying adhesions.  Required NG tube.  Now resolved.  Has been seen by general surgery.   Hypokalemia -replaced and stable.  Acute kidney injury - had improved but on 03/01/2019 slight bump, gently hydrate and monitor.  Thrombocytopenia -resolved.  Leukocytosis -Most likely reactive.  Resolved  Recent COVID-19 infection -Patient reports being diagnosed on 30-Jan-2023 at his PCP Dr. Josue Hector office completed 3-week isolation period on 1/21, repeat Covid PCR was positive on 1/22, suspect this is residual viral fragments, previous MD had called Dr. Josue Hector office and was able to confirm original positive Covid diagnosis on 01-30-23.  Stable from the standpoint.   History of CAD  - Remote history of stents, -Stable  Hypertension -Continue on Cardizem and beta-blocker combination.  GERD -Continue oral PPI.  Generalized deconditioning -  PT recommends SNF placement.  Social worker following.     DVT prophylaxis: Eliquis Code Status: DNR Family Communication: Called daughter Malachy Mood on 02/24/2019 at 11:35 AM - message left, message left again on 02/25/2019 at 11:36 AM Disposition Plan: SNF placement once hematuria has resolved and AKI improved. Consultants: Cardiology/general surgery  Procedures: None  Antimicrobials:  None   Objective: Vitals:   02/28/19 1728 02/28/19 2028 03/01/19 0402 03/01/19 0525  BP: 138/60 (!) 124/51 (!) 138/52   Pulse: 93 87 77   Resp:   18   Temp:  99.2 F (37.3 C) 98.9 F (37.2 C)   TempSrc:  Oral Oral   SpO2:  96% 94%   Weight:    120.3 kg  Height:        Intake/Output Summary (Last 24 hours) at 03/01/2019 0951 Last data filed at 03/01/2019 0843 Gross per 24 hour  Intake 1280 ml  Output 2200 ml  Net -920 ml   Filed Weights   02/27/19 2245 02/28/19 0433  03/01/19 0525  Weight: 121.7 kg 121.7 kg 120.3 kg    Examination:  Awake Alert, No new F.N deficits, Normal affect [place].AT,[name] Supple Neck,No JVD, No cervical lymphadenopathy appriciated.  Symmetrical Chest wall movement, Good air movement bilaterally, CTAB RRR,No Gallops, Rubs or new Murmurs, No Parasternal Heave +ve B.Sounds, Abd Soft, suprapubic fullness and tenderness on exam, No Cyanosis, Clubbing or edema, No new Rash or bruise   Data Reviewed: I have personally reviewed following labs and imaging studies  CBC: Recent Labs  Lab 02/24/19 0316 02/25/19 0534 02/26/19 0847 02/27/19 0334 03/01/19 0457  WBC 7.5 7.2 7.8 6.4 8.6  NEUTROABS 4.7 4.6 5.3 4.4 6.3  HGB 9.9* 10.3* 11.0* 9.7* 9.6*  HCT 30.0* 31.4* 33.7* 29.0* 28.8*  MCV 89.8 90.2 90.1 89.5 89.4  PLT 207 221 276 221 [id number]   Basic Metabolic Panel: Recent Labs  Lab 02/24/19 0316 02/25/19 0534 02/26/19 0847 02/27/19 0334 03/01/19 0457  NA 137 137 137 137 134*  K 3.8 3.7 4.0 3.7 3.9  CL 106 105 103 103 101  CO2 23 24 23 23 23   GLUCOSE 117* 113* 113* 108* 114*  BUN 14 14 17 18 20   CREATININE 1.37* 1.20 1.50* 1.31* 1.60*  CALCIUM 7.9* 8.0* 8.4* 8.1* 8.0*  MG 1.7 1.7 1.8 1.7 1.9   GFR: Estimated Creatinine Clearance: 48.4 mL/min (A) (by C-G formula based on SCr of 1.6 mg/dL (H)). Liver Function Tests: Recent Labs  Lab 03/01/19 0457  AST 24  ALT 36  ALKPHOS 123  BILITOT 0.6  PROT 5.3*  ALBUMIN 2.0*   No results for input(s): LIPASE, AMYLASE in the last 168 hours. No results for input(s): AMMONIA in the last 168 hours. Coagulation Profile: No results for input(s): INR, PROTIME in the last 168 hours. Cardiac Enzymes: No results for input(s): CKTOTAL, CKMB, CKMBINDEX, TROPONINI in the last 168 hours. BNP (last 3 results) No results for input(s): PROBNP in the last 8760 hours. HbA1C: No results for input(s): HGBA1C in the last 72 hours. CBG: No results for input(s): GLUCAP in the last 168  hours. Lipid Profile: No results for input(s): CHOL, HDL, LDLCALC, TRIG, CHOLHDL, LDLDIRECT in the last 72 hours. Thyroid Function Tests: Recent Labs    02/27/19 0334 02/28/19 0327  TSH  --  0.012*  FREET4 2.32*  --    Anemia Panel: No results for input(s): VITAMINB12, FOLATE, FERRITIN, TIBC, IRON, RETICCTPCT in the last 72 hours. Sepsis Labs: No results for input(s): PROCALCITON, LATICACIDVEN in the last 168 hours.  Recent Results (from the past 240 hour(s))  Culture, Urine     Status: Abnormal (Preliminary result)   Collection Time: 02/27/19  9:36 AM   Specimen: Urine, Random  Result Value Ref Range Status   Specimen Description URINE, RANDOM  Final   Special Requests NONE  Final   Culture (A)  Final    >=100,000 COLONIES/mL GRAM NEGATIVE RODS 90,000 COLONIES/mL GRAM NEGATIVE RODS IDENTIFICATION AND SUSCEPTIBILITIES TO FOLLOW Performed at [facility], [address], [place], [place] [address]    Report Status PENDING  Incomplete       Radiology Studies: US RENAL  Result  Date: 02/27/2019 CLINICAL DATA:  26 year old with acute kidney injury and hematuria. EXAM: RENAL / URINARY TRACT ULTRASOUND COMPLETE COMPARISON:  No prior ultrasound. Unenhanced CT abdomen and pelvis 02/12/2019 is correlated. FINDINGS: Right Kidney: Renal measurements: Approximately 10.3 x 5.7 x 5.7 cm = volume: 176 mL. Normal parenchymal echotexture. Mild diffuse cortical thinning. No hydronephrosis. No parenchymal masses. No visible shadowing calculi. Left Kidney: Renal measurements: Approximately 12.8 x 7.0 x 8.6 cm = volume: 402 mL. Normal parenchymal echotexture. Mild diffuse cortical thinning. No hydronephrosis. No parenchymal masses. No visible shadowing calculi. Bladder: Obscured by overlying bowel gas. Other: None. IMPRESSION: 1. No evidence of hydronephrosis involving either kidney to suggest urinary tract obstruction. 2. Mild diffuse cortical thinning involving both kidneys. No focal  abnormality involving either kidney and no visible shadowing calculi. Electronically Signed   By: Evangeline Dakin M.D.   On: 02/27/2019 14:01     Scheduled Meds: . amiodarone  200 mg Oral BID  . [START ON 03/02/2019] apixaban  5 mg Oral BID  . Chlorhexidine Gluconate Cloth  6 each Topical Daily  . digoxin  0.125 mg Oral Daily  . diltiazem  360 mg Oral Daily  . docusate sodium  100 mg Oral BID  . feeding supplement  1 Container Oral BID BM  . feeding supplement (ENSURE ENLIVE)  237 mL Oral Q1500  . guaiFENesin  15 mL Per Tube Q6H  . methimazole  20 mg Oral TID  . metoprolol tartrate  200 mg Oral BID  . multivitamin with minerals  1 tablet Oral Daily  . pantoprazole  40 mg Oral BID  . pravastatin  10 mg Oral q1800  . tamsulosin  0.4 mg Oral Daily   Continuous Infusions: . cefTRIAXone (ROCEPHIN)  IV 1 g (02/28/19 1147)  . lactated ringers 100 mL/hr at 03/01/19 [id number]    Lala Lund, MD [facility] 03/01/2019, 9:51 AM

## 2019-03-02 DIAGNOSIS — R319 Hematuria, unspecified: Secondary | ICD-10-CM

## 2019-03-02 LAB — COMPREHENSIVE METABOLIC PANEL
ALT: 32 U/L (ref 0–44)
AST: 24 U/L (ref 15–41)
Albumin: 1.8 g/dL — ABNORMAL LOW (ref 3.5–5.0)
Alkaline Phosphatase: 97 U/L (ref 38–126)
Anion gap: 8 (ref 5–15)
BUN: 22 mg/dL (ref 8–23)
CO2: 23 mmol/L (ref 22–32)
Calcium: 7.9 mg/dL — ABNORMAL LOW (ref 8.9–10.3)
Chloride: 100 mmol/L (ref 98–111)
Creatinine, Ser: 1.41 mg/dL — ABNORMAL HIGH (ref 0.61–1.24)
GFR calc Af Amer: 53 mL/min — ABNORMAL LOW (ref >60)
GFR calc non Af Amer: 46 mL/min — ABNORMAL LOW (ref >60)
Glucose, Bld: 116 mg/dL — ABNORMAL HIGH (ref 70–99)
Potassium: 3.8 mmol/L (ref 3.5–5.1)
Sodium: 131 mmol/L — ABNORMAL LOW (ref 135–145)
Total Bilirubin: 0.4 mg/dL (ref 0.3–1.2)
Total Protein: 5.2 g/dL — ABNORMAL LOW (ref 6.5–8.1)

## 2019-03-02 LAB — URINE CULTURE: Culture: >=100000 — AB

## 2019-03-02 LAB — CBC WITH DIFFERENTIAL/PLATELET
Abs Immature Granulocytes: 0.04 10*3/uL (ref 0.00–0.07)
Basophils Absolute: 0 10*3/uL (ref 0.0–0.1)
Basophils Relative: 1 %
Eosinophils Absolute: 0.2 10*3/uL (ref 0.0–0.5)
Eosinophils Relative: 3 %
HCT: 27.2 % — ABNORMAL LOW (ref 39.0–52.0)
Hemoglobin: 9 g/dL — ABNORMAL LOW (ref 13.0–17.0)
Immature Granulocytes: 1 %
Lymphocytes Relative: 19 %
Lymphs Abs: 1.1 10*3/uL (ref 0.7–4.0)
MCH: 29.7 pg (ref 26.0–34.0)
MCHC: 33.1 g/dL (ref 30.0–36.0)
MCV: 89.8 fL (ref 80.0–100.0)
Monocytes Absolute: 0.6 10*3/uL (ref 0.1–1.0)
Monocytes Relative: 11 %
Neutro Abs: 3.7 10*3/uL (ref 1.7–7.7)
Neutrophils Relative %: 65 %
Platelets: 217 10*3/uL (ref 150–400)
RBC: 3.03 MIL/uL — ABNORMAL LOW (ref 4.22–5.81)
RDW: 14 % (ref 11.5–15.5)
WBC: 5.6 10*3/uL (ref 4.0–10.5)
nRBC: 0 % (ref 0.0–0.2)

## 2019-03-02 LAB — DIGOXIN LEVEL: Digoxin Level: 1 ng/mL (ref 0.8–2.0)

## 2019-03-02 LAB — D-DIMER, QUANTITATIVE: D-Dimer, Quant: 2.3 ug/mL-FEU — ABNORMAL HIGH (ref 0.00–0.50)

## 2019-03-02 LAB — MAGNESIUM: Magnesium: 1.9 mg/dL (ref 1.7–2.4)

## 2019-03-02 LAB — BRAIN NATRIURETIC PEPTIDE: B Natriuretic Peptide: 317.6 pg/mL — ABNORMAL HIGH (ref 0.0–100.0)

## 2019-03-02 MED ORDER — HEPARIN SODIUM (PORCINE) 5000 UNIT/ML IJ SOLN
5000.0000 [IU] | Freq: Three times a day (TID) | INTRAMUSCULAR | Status: DC
  Administered 2019-03-02 – 2019-03-07 (×14): 5000 [IU] via SUBCUTANEOUS
  Filled 2019-03-02 (×13): qty 1

## 2019-03-02 NOTE — Progress Notes (Addendum)
Occupational Therapy Treatment Patient Details Name: Derek Foster MRN: [id number] DOB: 11/04/1935 Today's Date: 03/02/2019    History of present illness 84 year old male with history of CAD status post stent in 2011, hypertension, hyperlipidemia, GERD, hypothyroidism presented with abdominal pain, nausea, vomiting with generalized weakness since his recent Covid diagnosis on Feb 01, 2020 (patient's wife passed away from COVID-63 2 weeks ago).In the ED, he was noted to be in A. fib with RVR with rates in 140s to 160s and small bowel obstruction.  Cardiology and general surgery were consulted.   OT comments  Pt progressing towards goals, but still limited by decreased strength and endurance. Pt received sitting up in chair. Pt Mod A for initial sit to stand from recliner, progressing to [place] A for sit to stand with RW by end of session. Pt Min guard +2 for equipment mgmt to walk to sink in room using RW. Due to fatigue, pt completed grooming tasks seated at sink after setup. Reinforced use of energy conservation strategies with rest break. Pt Min guard for stand pivot back to recliner chair using RW. DC plan remains appropriate. Will continue to follow acutely.    Follow Up Recommendations  SNF;Supervision/Assistance - 24 hour    Equipment Recommendations  Other (comment)(Defer to next venue)    Recommendations for Other Services      Precautions / Restrictions Precautions Precautions: Fall Restrictions Weight Bearing Restrictions: No       Mobility Bed Mobility Overal bed mobility: Needs Assistance             General bed mobility comments: in chair on arrival  Transfers Overall transfer level: Needs assistance Equipment used: Rolling walker (2 wheeled) Transfers: Sit to/from [employer] Transfers Sit to Stand: Mod assist;Min assist Stand pivot transfers: Min guard       General transfer comment: Pt required Mod A for first sit to stand, Min A for second sit to  stand    Balance Overall balance assessment: Mild deficits observed, not formally tested                                         ADL either performed or assessed with clinical judgement   ADL       Grooming: Set up;Sitting;Brushing hair;Oral care;Wash/dry face                               Functional mobility during ADLs: Min guard;+2 for safety/equipment;Rolling walker General ADL Comments: Pt fatigued after walking halfway across room     Vision       Perception     Praxis      Cognition Arousal/Alertness: Awake/alert Behavior During Therapy: WFL for tasks assessed/performed Overall Cognitive Status: Within Functional Limits for tasks assessed                                          Exercises     Shoulder Instructions       General Comments      Pertinent Vitals/ Pain       Pain Assessment: No/denies pain  Home Living  Prior Functioning/Environment              Frequency  Min 2X/week        Progress Toward Goals  OT Goals(current goals can now be found in the care plan section)  Progress towards OT goals: Progressing toward goals  Acute Rehab OT Goals Patient Stated Goal: to get stronger OT Goal Formulation: With patient Time For Goal Achievement: 03/05/19 Potential to Achieve Goals: Good ADL Goals Pt Will Perform Grooming: with modified independence;standing Pt Will Perform Lower Body Bathing: with modified independence;sit to/from stand;with adaptive equipment Pt Will Perform Upper Body Dressing: with modified independence;sitting Pt Will Perform Lower Body Dressing: with modified independence;with adaptive equipment;sit to/from stand Pt Will Transfer to Toilet: with modified independence;ambulating Pt Will Perform Toileting - Clothing Manipulation and hygiene: with modified independence;sit to/from stand Pt/caregiver will Perform  Home Exercise Program: Increased strength;Both right and left upper extremity;With written HEP provided;Independently  Plan Discharge plan remains appropriate    Co-evaluation                 AM-PAC OT "6 Clicks" Daily Activity     Outcome Measure   Help from another person eating meals?: None Help from another person taking care of personal grooming?: A Little Help from another person toileting, which includes using toliet, bedpan, or urinal?: A Little Help from another person bathing (including washing, rinsing, drying)?: A Lot Help from another person to put on and taking off regular upper body clothing?: A Little Help from another person to put on and taking off regular lower body clothing?: A Lot 6 Click Score: 17    End of Session Equipment Utilized During Treatment: Gait belt;Rolling walker  OT Visit Diagnosis: Unsteadiness on feet (R26.81);Muscle weakness (generalized) (M62.81)   Activity Tolerance Patient tolerated treatment well;Patient limited by fatigue   Patient Left in chair;with call bell/phone within reach;with chair alarm set   Nurse Communication          Time: [id number] OT Time Calculation (min): 36 min  Charges: OT General Charges $OT Visit: 1 Visit OT Treatments $Self Care/Home Management : 23-37 mins  Layla Maw, OTR/L   Layla Maw 03/02/2019, 1:47 PM

## 2019-03-02 NOTE — Progress Notes (Signed)
Patient ID: Derek Foster, male   DOB: 1935/10/14, 84 y.o.   MRN: [id number]  PROGRESS NOTE    Derek Foster  [id number] DOB: 07/23/1935 DOA: 02/11/2019 PCP: Celene Squibb, MD   Brief Narrative:  84 year old male with history of CAD status post stent in 2011, hypertension, hyperlipidemia, GERD, hypothyroidism presented with abdominal pain, nausea, vomiting with generalized weakness since his recent Covid diagnosis on 01-27-20 (patient's wife passed away from COVID-40 2 weeks ago). In the ED, he was noted to be in A. fib with RVR with rates in 140s to 160s and small bowel obstruction.  Cardiology and general surgery were consulted.  Treated with amiodarone drip/Cardizem drip/IV metoprolol and digoxin.  SBO was treated with NG tube and bowel rest.   Subjective:  - Patient in bed denies any headache chest or abdominal pain, no shortness of breath .  Assessment & Plan:   New onset paroxysmal atrial fibrillation/flutter with rapid ventricular response - hard to control rate, [place] vas 2 score of at least 3.   - Currently on Eliquis(on hold due to hematuria), amiodarone, digoxin, beta-blocker and Cardizem and still in RVR, medications adjusted further on 02/26/2019 for better heart rate control, I think this is primarily being driven by hyperthyroidism, methimazole dose has been increased further. RVR is improving.   Hyperthyroidism -TSH is still suppressed with increased T4, have increased methimazole dose.  Will require outpatient endocrine follow-up. Repeat TSH along with free T4 and T3 in 2 weeks time with outpatient endocrine follow-up.   Lab Results  Component Value Date   TSH 0.012 (L) 02/28/2019    New onset hematuria outlet obstruction.  -Eliquis remains on hold, will continue to hold again today given he still having some hematuria, I have increased his Lasix to 150 every 3 hours . -Eliquis when hematuria has resolved, for now I will start on subcu heparin . -Monitor H&H closely  . -Previous MD discussed with urologist Dr. Louis Meckel on 02/27/2019, he has also been placed on Flomax, will monitor will require outpatient urology follow-up.   Small bowel obstruction  - Recurrent ventral incisional hernia, likely due to underlying adhesions.  Required NG tube.  Now resolved.  Has been seen by general surgery.   Hypokalemia  -replaced and stable.  Acute kidney injury  - had improved but on 03/01/2019 slight bump, gently hydrate and monitor.  Thrombocytopenia  -resolved.  Leukocytosis -Most likely reactive.  Resolved  Recent COVID-19 infection  -Patient reports being diagnosed on 01-27-2023 at his PCP Dr. Josue Hector office completed 3-week isolation period on 1/21, repeat Covid PCR was positive on 1/22, suspect this is residual viral fragments, previous MD had called Dr. Josue Hector office and was able to confirm original positive Covid diagnosis on 2023-01-27.  Stable from the standpoint.   History of CAD   - Remote history of stents, -Stable  Hypertension  -Continue on Cardizem and beta-blocker combination.  GERD  -Continue oral PPI.  Generalized deconditioning    PT recommends SNF placement.  Social worker following.  Bereavement disorder -Wife passed last month due to Covid, patient appears to be depressed, will consult chaplain     DVT prophylaxis: Eliquis Code Status: DNR Family Communication: D/W patient Disposition Plan: SNF placement once hematuria has resolved . Consultants: Cardiology/general surgery  Procedures: None  Antimicrobials:  None   Objective: Vitals:   03/01/19 1638 03/01/19 2101 03/02/19 0308 03/02/19 0523  BP: (!) 143/71 (!) 140/53  (!) 129/59  Pulse: 78 70  92  Resp: 16 18  18   Temp: 97.8 F (36.6 C) 98.2 F (36.8 C)  99.7 F (37.6 C)  TempSrc: Oral Oral  Oral  SpO2: 98% 99%  95%  Weight:   124.3 kg   Height:        Intake/Output Summary (Last 24 hours) at 03/02/2019 1055 Last data filed at 03/02/2019 [id number] Gross per 24  hour  Intake 2281.38 ml  Output 2650 ml  Net -368.62 ml   Filed Weights   02/28/19 0433 03/01/19 0525 03/02/19 0308  Weight: 121.7 kg 120.3 kg 124.3 kg    Examination:  Awake Alert, Oriented X 3, No new F.N deficits, Appears to be in a depressed mood. Symmetrical Chest wall movement, Good air movement bilaterally, CTAB RRR,No Gallops,Rubs or new Murmurs, No Parasternal Heave +ve B.Sounds, Abd Soft, No tenderness, No rebound - guarding or rigidity. No Cyanosis, Clubbing or edema, No new Rash or bruise   Patient with Foley catheter, still urine looks dark some sediment at well.    Data Reviewed: I have personally reviewed following labs and imaging studies  CBC: Recent Labs  Lab 02/25/19 0534 02/26/19 0847 02/27/19 0334 03/01/19 0457 03/02/19 0438  WBC 7.2 7.8 6.4 8.6 5.6  NEUTROABS 4.6 5.3 4.4 6.3 3.7  HGB 10.3* 11.0* 9.7* 9.6* 9.0*  HCT 31.4* 33.7* 29.0* 28.8* 27.2*  MCV 90.2 90.1 89.5 89.4 89.8  PLT 221 276 221 232 [id number]   Basic Metabolic Panel: Recent Labs  Lab 02/25/19 0534 02/26/19 0847 02/27/19 0334 03/01/19 0457 03/02/19 0438  NA 137 137 137 134* 131*  K 3.7 4.0 3.7 3.9 3.8  CL 105 103 103 101 100  CO2 24 23 23 23 23   GLUCOSE 113* 113* 108* 114* 116*  BUN 14 17 18 20 22   CREATININE 1.20 1.50* 1.31* 1.60* 1.41*  CALCIUM 8.0* 8.4* 8.1* 8.0* 7.9*  MG 1.7 1.8 1.7 1.9 1.9   GFR: Estimated Creatinine Clearance: 55.8 mL/min (A) (by C-G formula based on SCr of 1.41 mg/dL (H)). Liver Function Tests: Recent Labs  Lab 03/01/19 0457 03/02/19 0438  AST 24 24  ALT 36 32  ALKPHOS 123 97  BILITOT 0.6 0.4  PROT 5.3* 5.2*  ALBUMIN 2.0* 1.8*   No results for input(s): LIPASE, AMYLASE in the last 168 hours. No results for input(s): AMMONIA in the last 168 hours. Coagulation Profile: No results for input(s): INR, PROTIME in the last 168 hours. Cardiac Enzymes: No results for input(s): CKTOTAL, CKMB, CKMBINDEX, TROPONINI in the last 168 hours. BNP (last 3  results) No results for input(s): PROBNP in the last 8760 hours. HbA1C: No results for input(s): HGBA1C in the last 72 hours. CBG: No results for input(s): GLUCAP in the last 168 hours. Lipid Profile: No results for input(s): CHOL, HDL, LDLCALC, TRIG, CHOLHDL, LDLDIRECT in the last 72 hours. Thyroid Function Tests: Recent Labs    02/28/19 0327  TSH 0.012*   Anemia Panel: No results for input(s): VITAMINB12, FOLATE, FERRITIN, TIBC, IRON, RETICCTPCT in the last 72 hours. Sepsis Labs: No results for input(s): PROCALCITON, LATICACIDVEN in the last 168 hours.  Recent Results (from the past 240 hour(s))  Culture, Urine     Status: Abnormal   Collection Time: 02/27/19  9:36 AM   Specimen: Urine, Random  Result Value Ref Range Status   Specimen Description URINE, RANDOM  Final   Special Requests   Final    NONE Performed at [facility], [address] [place], [place]  [address]    Culture (A)  Final    >=100,000 COLONIES/mL PROTEUS MIRABILIS 90,000 COLONIES/mL KLEBSIELLA OXYTOCA    Report Status 03/02/2019 FINAL  Final   Organism ID, Bacteria KLEBSIELLA OXYTOCA (A)  Final   Organism ID, Bacteria PROTEUS MIRABILIS (A)  Final      Susceptibility   Klebsiella oxytoca - MIC*    AMPICILLIN >=32 RESISTANT Resistant     CEFAZOLIN >=64 RESISTANT Resistant     CEFTRIAXONE <=0.25 SENSITIVE Sensitive     CIPROFLOXACIN <=0.25 SENSITIVE Sensitive     GENTAMICIN <=1 SENSITIVE Sensitive     IMIPENEM <=0.25 SENSITIVE Sensitive     NITROFURANTOIN <=16 SENSITIVE Sensitive     TRIMETH/SULFA <=20 SENSITIVE Sensitive     AMPICILLIN/SULBACTAM 16 INTERMEDIATE Intermediate     PIP/TAZO <=4 SENSITIVE Sensitive     * 90,000 COLONIES/mL KLEBSIELLA OXYTOCA   Proteus mirabilis - MIC*    AMPICILLIN <=2 SENSITIVE Sensitive     CEFAZOLIN <=4 SENSITIVE Sensitive     CEFTRIAXONE <=0.25 SENSITIVE Sensitive     CIPROFLOXACIN 1 SENSITIVE Sensitive     GENTAMICIN <=1 SENSITIVE Sensitive     IMIPENEM  4 SENSITIVE Sensitive     NITROFURANTOIN 256 RESISTANT Resistant     TRIMETH/SULFA <=20 SENSITIVE Sensitive     AMPICILLIN/SULBACTAM <=2 SENSITIVE Sensitive     PIP/TAZO <=4 SENSITIVE Sensitive     * >=100,000 COLONIES/mL PROTEUS MIRABILIS       Radiology Studies: No results found.   Scheduled Meds: . amiodarone  200 mg Oral BID  . Chlorhexidine Gluconate Cloth  6 each Topical Daily  . digoxin  0.125 mg Oral Daily  . diltiazem  360 mg Oral Daily  . docusate sodium  100 mg Oral BID  . feeding supplement  1 Container Oral BID BM  . feeding supplement (ENSURE ENLIVE)  237 mL Oral Q1500  . guaiFENesin  15 mL Per Tube Q6H  . heparin injection (subcutaneous)  5,000 Units Subcutaneous Q8H  . methimazole  20 mg Oral TID  . metoprolol tartrate  200 mg Oral BID  . multivitamin with minerals  1 tablet Oral Daily  . pantoprazole  40 mg Oral BID  . pravastatin  10 mg Oral q1800  . tamsulosin  0.4 mg Oral Daily   Continuous Infusions: . cefTRIAXone (ROCEPHIN)  IV 1 g (03/01/19 1136)    Phillips Climes, MD [facility] 03/02/2019, 10:55 AM

## 2019-03-02 NOTE — Progress Notes (Signed)
Nutrition Follow-up   RD working remotely.  DOCUMENTATION CODES:   Obesity unspecified  INTERVENTION:  Continue Boost Breeze po BID, each supplement provides 250 kcal and 9 grams of protein  Provide Ensure Enlive po once daily, each supplement provides 350 kcal and 20 grams of protein  Encourage adequate PO intake.   NUTRITION DIAGNOSIS:   Increased nutrient needs related to acute illness(COVID-19) as evidenced by estimated needs; ongoing  GOAL:   Patient will meet greater than or equal to 90% of their needs; met  MONITOR:   PO intake, Supplement acceptance, Diet advancement, Labs, Weight trends, Skin, I & O's  REASON FOR ASSESSMENT:   NPO/Clear Liquid Diet    ASSESSMENT:   Derek Foster  is a 84 y.o. male, with past medical history of CAD, status post stent in 2011, hypertension, hyperlipidemia, GERD, hypothyroidism, patient presents with complaints of abdominal pain, nausea, vomiting for last 24 hours, patient report overall generalized weakness, since his recent Covid diagnosis 12/30,(patient's wife passed away from COVID-19 2 weeks ago), patient denies any chest pain, fever, chills, hemoptysis, diarrhea, as well patient reports he has generalized weakness since his COVID-19 diagnosis, has been mainly using his wheelchair, as well he reports poor appetite, did not require admission or any treatment for his COVID-19.- in ED patient was noted to be in A. fib with RVR.  Meal completion has been 80-100%. Pt currently has Boost Breeze and Ensure ordered with varied consumption. RD to continue with current orders to aid in increased caloric and protein needs.   Labs and medications reviewed.   Diet Order:   Diet Order            DIET SOFT Room service appropriate? Yes; Fluid consistency: Thin; Fluid restriction: 1500 mL Fluid  Diet effective now              EDUCATION NEEDS:   No education needs have been identified at this time  Skin:  Skin Assessment: Reviewed RN  Assessment  Last BM:  2/7  Height:   Ht Readings from Last 1 Encounters:  02/11/19 6' 1" (1.854 m)    Weight:   Wt Readings from Last 1 Encounters:  03/02/19 124.3 kg    Ideal Body Weight:  83.6 kg  BMI:  Body mass index is 36.15 kg/m.  Estimated Nutritional Needs:   Kcal:  2100-2300  Protein:  105-125 grams  Fluid:  1.5 L/day  Stephanie Craig, MS, RD, LDN RD pager number/after hours weekend pager number on Amion.  

## 2019-03-02 NOTE — Progress Notes (Signed)
Urinary catheter flushed per order.

## 2019-03-03 LAB — CBC WITH DIFFERENTIAL/PLATELET
Abs Immature Granulocytes: 0.05 10*3/uL (ref 0.00–0.07)
Basophils Absolute: 0 10*3/uL (ref 0.0–0.1)
Basophils Relative: 1 %
Eosinophils Absolute: 0.2 10*3/uL (ref 0.0–0.5)
Eosinophils Relative: 4 %
HCT: 26.4 % — ABNORMAL LOW (ref 39.0–52.0)
Hemoglobin: 9 g/dL — ABNORMAL LOW (ref 13.0–17.0)
Immature Granulocytes: 1 %
Lymphocytes Relative: 24 %
Lymphs Abs: 1.1 10*3/uL (ref 0.7–4.0)
MCH: 29.9 pg (ref 26.0–34.0)
MCHC: 34.1 g/dL (ref 30.0–36.0)
MCV: 87.7 fL (ref 80.0–100.0)
Monocytes Absolute: 0.5 10*3/uL (ref 0.1–1.0)
Monocytes Relative: 11 %
Neutro Abs: 2.6 10*3/uL (ref 1.7–7.7)
Neutrophils Relative %: 59 %
Platelets: 207 10*3/uL (ref 150–400)
RBC: 3.01 MIL/uL — ABNORMAL LOW (ref 4.22–5.81)
RDW: 13.6 % (ref 11.5–15.5)
WBC: 4.5 10*3/uL (ref 4.0–10.5)
nRBC: 0 % (ref 0.0–0.2)

## 2019-03-03 LAB — COMPREHENSIVE METABOLIC PANEL
ALT: 27 U/L (ref 0–44)
AST: 19 U/L (ref 15–41)
Albumin: 2 g/dL — ABNORMAL LOW (ref 3.5–5.0)
Alkaline Phosphatase: 82 U/L (ref 38–126)
Anion gap: 9 (ref 5–15)
BUN: 19 mg/dL (ref 8–23)
CO2: 25 mmol/L (ref 22–32)
Calcium: 8 mg/dL — ABNORMAL LOW (ref 8.9–10.3)
Chloride: 102 mmol/L (ref 98–111)
Creatinine, Ser: 1.15 mg/dL (ref 0.61–1.24)
GFR calc Af Amer: >60 mL/min (ref >60)
GFR calc non Af Amer: 59 mL/min — ABNORMAL LOW (ref >60)
Glucose, Bld: 117 mg/dL — ABNORMAL HIGH (ref 70–99)
Potassium: 3.8 mmol/L (ref 3.5–5.1)
Sodium: 136 mmol/L (ref 135–145)
Total Bilirubin: 0.3 mg/dL (ref 0.3–1.2)
Total Protein: 5.1 g/dL — ABNORMAL LOW (ref 6.5–8.1)

## 2019-03-03 LAB — BRAIN NATRIURETIC PEPTIDE: B Natriuretic Peptide: 345.4 pg/mL — ABNORMAL HIGH (ref 0.0–100.0)

## 2019-03-03 LAB — D-DIMER, QUANTITATIVE: D-Dimer, Quant: 1.69 ug/mL-FEU — ABNORMAL HIGH (ref 0.00–0.50)

## 2019-03-03 LAB — MAGNESIUM: Magnesium: 2 mg/dL (ref 1.7–2.4)

## 2019-03-03 MED ORDER — LIDOCAINE HCL URETHRAL/MUCOSAL 2 % EX GEL
1.0000 "application " | Freq: Once | CUTANEOUS | Status: AC
  Administered 2019-03-03: 1 via URETHRAL
  Filled 2019-03-03: qty 20

## 2019-03-03 MED ORDER — SODIUM CHLORIDE 0.9 % IR SOLN
3000.0000 mL | Status: DC
  Administered 2019-03-03 – 2019-03-05 (×17): 3000 mL

## 2019-03-03 NOTE — Plan of Care (Signed)
  Problem: Education: Goal: Knowledge of General Education information will improve Description: Including pain rating scale, medication(s)/side effects and non-pharmacologic comfort measures Outcome: Progressing   Problem: Activity: Goal: Risk for activity intolerance will decrease Outcome: Progressing   Problem: Education: Goal: Knowledge of risk factors and measures for prevention of condition will improve Outcome: Progressing   Problem: Respiratory: Goal: Will maintain a patent airway Outcome: Progressing Goal: Complications related to the disease process, condition or treatment will be avoided or minimized Outcome: Progressing   Maintaining oxygenation status on RA. Frequent education provided on CBI and need for new foley.

## 2019-03-03 NOTE — Progress Notes (Signed)
RN provided update to patient's daughter this afternoon at approximately 1500. All questions and concerns answered.

## 2019-03-03 NOTE — Progress Notes (Signed)
PT Cancellation Note  Patient Details Name: Derek Foster MRN: [id number] DOB: Oct 26, 1935   Cancelled Treatment:    Reason Eval/Treat Not Completed: Pain limiting ability to participate  Patient reporting pain from catheter and cannot tolerate activity at this time. Patient reports RN and MD aware "and not sure what they are going to do, but they have to do something because I can't take this." Assured RN is aware.   Arby Barrette, PT Pager [phone]   Rexanne Mano 03/03/2019, 10:24 AM

## 2019-03-03 NOTE — Progress Notes (Signed)
Patient ID: Derek Foster, male   DOB: 02-Feb-1935, 84 y.o.   MRN: [id number]  PROGRESS NOTE    Derek Foster  [id number] DOB: 31-Dec-1935 DOA: 02/11/2019 PCP: Celene Squibb, MD   Brief Narrative:  84 year old male with history of CAD status post stent in 2011, hypertension, hyperlipidemia, GERD, hypothyroidism presented with abdominal pain, nausea, vomiting with generalized weakness since his recent Covid diagnosis on 2020-02-09 (patient's wife passed away from COVID-11 2 weeks ago). In the ED, he was noted to be in A. fib with RVR with rates in 140s to 160s and small bowel obstruction.  Cardiology and general surgery were consulted.  Treated with amiodarone drip/Cardizem drip/IV metoprolol and digoxin.  SBO was treated with NG tube and bowel rest.  Patient was started on anticoagulation for his A. fib, unfortunately he did develop significant hematuria, and outlet obstruction, required Foley insertion and holding anticoagulation.   Subjective:  -Symptoms still denies any complaints, but per staff patient has been having blood clots coming from around his Foley catheter.  Assessment & Plan:   New onset paroxysmal atrial fibrillation/flutter with rapid ventricular response - hard to control rate, [place] vas 2 score of at least 3.   -Is on Eliquis, currently on hold due to hematuria , -Currently on  amiodarone, digoxin, beta-blocker and Cardizem for heart rate control . -I think this is primarily being driven by hyperthyroidism, methimazole dose has been increased further. RVR is improving.   Hyperthyroidism -TSH is still suppressed with increased T4, have increased methimazole dose.  Will require outpatient endocrine follow-up. Repeat TSH along with free T4 and T3 in 2 weeks time with outpatient endocrine follow-up.   Lab Results  Component Value Date   TSH 0.012 (L) 02/28/2019    New onset hematuria outlet obstruction.  -Is most likely due to acute hemorrhagic cystitis and full  anticoagulation . -Continue to hold Eliquis . -Despite 150 cc of water flushes every 3 hours, still having significant hematuria, and today's passing blood clots around his Foley , have discussed with Dr. Vikki Ports, plan to start CBI , change Foley catheter to 22 inch [place] three-way Foley . -Received 5 days of IV Rocephin due to UTI . -Continue with Flomax . -We will need to follow with urology as an outpatient .  Small bowel obstruction  - Recurrent ventral incisional hernia, likely due to underlying adhesions.  Required NG tube.  Now resolved.  Has been seen by general surgery.   Hypokalemia  -replaced and stable.  Acute kidney injury  - had improved but on 03/01/2019 slight bump, gently hydrate and monitor.  Thrombocytopenia  -resolved.  Leukocytosis -Most likely reactive.  Resolved  Recent COVID-19 infection  -Patient reports being diagnosed on 02/09/2023 at his PCP Dr. Josue Hector office completed 3-week isolation period on 1/21, repeat Covid PCR was positive on 1/22, suspect this is residual viral fragments, previous MD had called Dr. Josue Hector office and was able to confirm original positive Covid diagnosis on 02/09/2023.  Stable from the standpoint.   History of CAD   - Remote history of stents, -Stable  Hypertension  -Continue on Cardizem and beta-blocker combination.  GERD  -Continue oral PPI.  Generalized deconditioning    PT recommends SNF placement.  Social worker following.  Bereavement disorder -Wife passed last month due to Covid, patient appears to be depressed, will consult chaplain     DVT prophylaxis: Eliquis Code Status: DNR Family Communication: D/W patient Disposition Plan: SNF placement once hematuria has  resolved . Consultants: Cardiology/general surgery  Procedures: None  Antimicrobials:  None   Objective: Vitals:   03/02/19 2126 03/03/19 0437 03/03/19 0501 03/03/19 0932  BP: 132/69  (!) 144/74 (!) 162/82  Pulse: 78  86 89  Resp: 18  15     Temp: 98 F (36.7 C)  98.1 F (36.7 C)   TempSrc: Oral  Oral   SpO2: 100%  97%   Weight:  123.5 kg    Height:        Intake/Output Summary (Last 24 hours) at 03/03/2019 1324 Last data filed at 03/03/2019 0914 Gross per 24 hour  Intake 1410 ml  Output 1850 ml  Net -440 ml   Filed Weights   03/01/19 0525 03/02/19 0308 03/03/19 0437  Weight: 120.3 kg 124.3 kg 123.5 kg    Examination:  Awake Alert, Oriented X 3, No new F.N deficits, Normal affect Symmetrical Chest wall movement, Good air movement bilaterally, CTAB RRR,No Gallops,Rubs or new Murmurs, No Parasternal Heave +ve B.Sounds, Abd Soft, No tenderness, No rebound - guarding or rigidity. No Cyanosis, Clubbing or edema, No new Rash or bruise   Sitter significant for bright red blood/blood clots numbing around the Foley, his hematuria though appears to be today.      Data Reviewed: I have personally reviewed following labs and imaging studies  CBC: Recent Labs  Lab 02/26/19 0847 02/27/19 0334 03/01/19 0457 03/02/19 0438 03/03/19 0737  WBC 7.8 6.4 8.6 5.6 4.5  NEUTROABS 5.3 4.4 6.3 3.7 2.6  HGB 11.0* 9.7* 9.6* 9.0* 9.0*  HCT 33.7* 29.0* 28.8* 27.2* 26.4*  MCV 90.1 89.5 89.4 89.8 87.7  PLT 276 221 232 217 [id number]   Basic Metabolic Panel: Recent Labs  Lab 02/26/19 0847 02/27/19 0334 03/01/19 0457 03/02/19 0438 03/03/19 0737  NA 137 137 134* 131* 136  K 4.0 3.7 3.9 3.8 3.8  CL 103 103 101 100 102  CO2 23 23 23 23 25   GLUCOSE 113* 108* 114* 116* 117*  BUN 17 18 20 22 19   CREATININE 1.50* 1.31* 1.60* 1.41* 1.15  CALCIUM 8.4* 8.1* 8.0* 7.9* 8.0*  MG 1.8 1.7 1.9 1.9 2.0   GFR: Estimated Creatinine Clearance: 68.2 mL/min (by C-G formula based on SCr of 1.15 mg/dL). Liver Function Tests: Recent Labs  Lab 03/01/19 0457 03/02/19 0438 03/03/19 0737  AST 24 24 19   ALT 36 32 27  ALKPHOS 123 97 82  BILITOT 0.6 0.4 0.3  PROT 5.3* 5.2* 5.1*  ALBUMIN 2.0* 1.8* 2.0*   No results for input(s): LIPASE, AMYLASE  in the last 168 hours. No results for input(s): AMMONIA in the last 168 hours. Coagulation Profile: No results for input(s): INR, PROTIME in the last 168 hours. Cardiac Enzymes: No results for input(s): CKTOTAL, CKMB, CKMBINDEX, TROPONINI in the last 168 hours. BNP (last 3 results) No results for input(s): PROBNP in the last 8760 hours. HbA1C: No results for input(s): HGBA1C in the last 72 hours. CBG: No results for input(s): GLUCAP in the last 168 hours. Lipid Profile: No results for input(s): CHOL, HDL, LDLCALC, TRIG, CHOLHDL, LDLDIRECT in the last 72 hours. Thyroid Function Tests: No results for input(s): TSH, T4TOTAL, FREET4, T3FREE, THYROIDAB in the last 72 hours. Anemia Panel: No results for input(s): VITAMINB12, FOLATE, FERRITIN, TIBC, IRON, RETICCTPCT in the last 72 hours. Sepsis Labs: No results for input(s): PROCALCITON, LATICACIDVEN in the last 168 hours.  Recent Results (from the past 240 hour(s))  Culture, Urine     Status: Abnormal  Collection Time: 02/27/19  9:36 AM   Specimen: Urine, Random  Result Value Ref Range Status   Specimen Description URINE, RANDOM  Final   Special Requests   Final    NONE Performed at [facility], [place] [address], [place],  [address]    Culture (A)  Final    >=100,000 COLONIES/mL PROTEUS MIRABILIS 90,000 COLONIES/mL KLEBSIELLA OXYTOCA    Report Status 03/02/2019 FINAL  Final   Organism ID, Bacteria KLEBSIELLA OXYTOCA (A)  Final   Organism ID, Bacteria PROTEUS MIRABILIS (A)  Final      Susceptibility   Klebsiella oxytoca - MIC*    AMPICILLIN >=32 RESISTANT Resistant     CEFAZOLIN >=64 RESISTANT Resistant     CEFTRIAXONE <=0.25 SENSITIVE Sensitive     CIPROFLOXACIN <=0.25 SENSITIVE Sensitive     GENTAMICIN <=1 SENSITIVE Sensitive     IMIPENEM <=0.25 SENSITIVE Sensitive     NITROFURANTOIN <=16 SENSITIVE Sensitive     TRIMETH/SULFA <=20 SENSITIVE Sensitive     AMPICILLIN/SULBACTAM 16 INTERMEDIATE Intermediate      PIP/TAZO <=4 SENSITIVE Sensitive     * 90,000 COLONIES/mL KLEBSIELLA OXYTOCA   Proteus mirabilis - MIC*    AMPICILLIN <=2 SENSITIVE Sensitive     CEFAZOLIN <=4 SENSITIVE Sensitive     CEFTRIAXONE <=0.25 SENSITIVE Sensitive     CIPROFLOXACIN 1 SENSITIVE Sensitive     GENTAMICIN <=1 SENSITIVE Sensitive     IMIPENEM 4 SENSITIVE Sensitive     NITROFURANTOIN 256 RESISTANT Resistant     TRIMETH/SULFA <=20 SENSITIVE Sensitive     AMPICILLIN/SULBACTAM <=2 SENSITIVE Sensitive     PIP/TAZO <=4 SENSITIVE Sensitive     * >=100,000 COLONIES/mL PROTEUS MIRABILIS       Radiology Studies: No results found.   Scheduled Meds: . amiodarone  200 mg Oral BID  . Chlorhexidine Gluconate Cloth  6 each Topical Daily  . digoxin  0.125 mg Oral Daily  . diltiazem  360 mg Oral Daily  . docusate sodium  100 mg Oral BID  . feeding supplement  1 Container Oral BID BM  . feeding supplement (ENSURE ENLIVE)  237 mL Oral Q1500  . guaiFENesin  15 mL Per Tube Q6H  . heparin injection (subcutaneous)  5,000 Units Subcutaneous Q8H  . lidocaine  1 application Urethral Once  . methimazole  20 mg Oral TID  . metoprolol tartrate  200 mg Oral BID  . multivitamin with minerals  1 tablet Oral Daily  . pantoprazole  40 mg Oral BID  . pravastatin  10 mg Oral q1800  . tamsulosin  0.4 mg Oral Daily   Continuous Infusions: . sodium chloride irrigation      Phillips Climes, MD [facility] 03/03/2019, 1:24 PM

## 2019-03-04 LAB — CBC WITH DIFFERENTIAL/PLATELET
Abs Immature Granulocytes: 0.09 10*3/uL — ABNORMAL HIGH (ref 0.00–0.07)
Basophils Absolute: 0 10*3/uL (ref 0.0–0.1)
Basophils Relative: 1 %
Eosinophils Absolute: 0.2 10*3/uL (ref 0.0–0.5)
Eosinophils Relative: 4 %
HCT: 25.8 % — ABNORMAL LOW (ref 39.0–52.0)
Hemoglobin: 8.6 g/dL — ABNORMAL LOW (ref 13.0–17.0)
Immature Granulocytes: 2 %
Lymphocytes Relative: 23 %
Lymphs Abs: 1.3 10*3/uL (ref 0.7–4.0)
MCH: 29.9 pg (ref 26.0–34.0)
MCHC: 33.3 g/dL (ref 30.0–36.0)
MCV: 89.6 fL (ref 80.0–100.0)
Monocytes Absolute: 0.5 10*3/uL (ref 0.1–1.0)
Monocytes Relative: 10 %
Neutro Abs: 3.3 10*3/uL (ref 1.7–7.7)
Neutrophils Relative %: 60 %
Platelets: 233 10*3/uL (ref 150–400)
RBC: 2.88 MIL/uL — ABNORMAL LOW (ref 4.22–5.81)
RDW: 13.7 % (ref 11.5–15.5)
WBC: 5.5 10*3/uL (ref 4.0–10.5)
nRBC: 0 % (ref 0.0–0.2)

## 2019-03-04 LAB — COMPREHENSIVE METABOLIC PANEL
ALT: 23 U/L (ref 0–44)
AST: 15 U/L (ref 15–41)
Albumin: 2 g/dL — ABNORMAL LOW (ref 3.5–5.0)
Alkaline Phosphatase: 86 U/L (ref 38–126)
Anion gap: 10 (ref 5–15)
BUN: 19 mg/dL (ref 8–23)
CO2: 26 mmol/L (ref 22–32)
Calcium: 8.2 mg/dL — ABNORMAL LOW (ref 8.9–10.3)
Chloride: 102 mmol/L (ref 98–111)
Creatinine, Ser: 1.38 mg/dL — ABNORMAL HIGH (ref 0.61–1.24)
GFR calc Af Amer: 55 mL/min — ABNORMAL LOW (ref >60)
GFR calc non Af Amer: 47 mL/min — ABNORMAL LOW (ref >60)
Glucose, Bld: 113 mg/dL — ABNORMAL HIGH (ref 70–99)
Potassium: 3.9 mmol/L (ref 3.5–5.1)
Sodium: 138 mmol/L (ref 135–145)
Total Bilirubin: 0.5 mg/dL (ref 0.3–1.2)
Total Protein: 5 g/dL — ABNORMAL LOW (ref 6.5–8.1)

## 2019-03-04 MED ORDER — SODIUM CHLORIDE 0.9 % IV SOLN
1.0000 g | INTRAVENOUS | Status: AC
  Administered 2019-03-04 – 2019-03-08 (×5): 1 g via INTRAVENOUS
  Filled 2019-03-04: qty 1
  Filled 2019-03-04 (×2): qty 10
  Filled 2019-03-04: qty 1
  Filled 2019-03-04: qty 10

## 2019-03-04 NOTE — Progress Notes (Signed)
Physical Therapy Treatment Patient Details Name: Derek Foster MRN: [id number] DOB: January 14, 1936 Today's Date: 03/04/2019    History of Present Illness 84 year old male with history of CAD status post stent in 2011, hypertension, hyperlipidemia, GERD, hypothyroidism presented with abdominal pain, nausea, vomiting with generalized weakness since his recent Covid diagnosis on 2020-02-01 (patient's wife passed away from COVID-19 2 weeks ago).In the ED, he was noted to be in A. fib with RVR with rates in 140s to 160s and small bowel obstruction.  Cardiology and general surgery were consulted.    PT Comments    RN messaged therapist around 2 pm stating pt had sat up in chair most of the morning and would like to have therapy before getting in bed for a nap. Pt is limited in safe mobility by fatigue and generalized weakness today. Pt is minA for sit>stand in RW and min guard for ambulation around bed to other side with increased vc for RW management around obstacles. D/c plans remain appropriate at this time. PT will continue to follow acutely.    Follow Up Recommendations  SNF;Supervision/Assistance - 24 hour(needs some assist and reports 24 hour assist not available.)     Equipment Recommendations  None recommended by PT       Precautions / Restrictions Precautions Precautions: Fall Precaution Comments: watch HR Restrictions Weight Bearing Restrictions: No    Mobility  Bed Mobility               General bed mobility comments: in chair on arrival  Transfers Overall transfer level: Needs assistance Equipment used: Rolling walker (2 wheeled) Transfers: Sit to/from [facility] Sit to Stand: Min assist Stand pivot transfers: Min guard       General transfer comment: Pt almost to full stance for sit to stand, but required Min A to achieve full upright position with RW.   Ambulation/Gait Ambulation/Gait assistance: Min guard Gait Distance (Feet): 13  Feet Assistive device: Rolling walker (2 wheeled) Gait Pattern/deviations: Step-to pattern;Step-through pattern;Decreased stride length;Shuffle;Trunk flexed Gait velocity: slowed Gait velocity interpretation: <1.31 ft/sec, indicative of household ambulator General Gait Details: hands on min guard for safety, vc for sequencing and maneuvering RW around obstacles in room. pt reports fatigue from sitting up in chair for extended period of time       Balance Overall balance assessment: Needs assistance Sitting-balance support: Feet supported;No upper extremity supported Sitting balance-Leahy Scale: Good     Standing balance support: Bilateral upper extremity supported Standing balance-Leahy Scale: Poor Standing balance comment: UE support for balance due to weakness/ flexed posture                            Cognition Arousal/Alertness: Awake/alert Behavior During Therapy: WFL for tasks assessed/performed Overall Cognitive Status: Within Functional Limits for tasks assessed                                        Exercises General Exercises - Upper Extremity Elbow Extension: Strengthening;Both;5 reps;Seated;Theraband Theraband Level (Elbow Extension): Level 1 (Yellow) General Exercises - Lower Extremity Ankle Circles/Pumps: AROM;Both;Seated Quad Sets: AROM;Both;Seated;10 reps Gluteal Sets: Other (comment)(instructed, did not perform) Long Arc Quad: AROM;Both;Seated;5 reps Hip ABduction/ADduction: AROM;Both;10 reps;Seated(feet elevated by recliner)    General Comments General comments (skin integrity, edema, etc.): VSS on RA, pt with saline flush and catheter       Pertinent  Vitals/Pain Pain Assessment: No/denies pain           PT Goals (current goals can now be found in the care plan section) Acute Rehab PT Goals Patient Stated Goal: to get stronger PT Goal Formulation: With patient Time For Goal Achievement: 03/04/19 Potential to Achieve  Goals: Fair Progress towards PT goals: Progressing toward goals    Frequency    Min 3X/week(functional decline has occurred with missed sessions)      PT Plan Current plan remains appropriate    Co-evaluation PT/OT/SLP Co-Evaluation/Treatment: Yes Reason for Co-Treatment: For patient/therapist safety PT goals addressed during session: Mobility/safety with mobility OT goals addressed during session: ADL's and self-care;Proper use of Adaptive equipment and DME      AM-PAC PT "6 Clicks" Mobility   Outcome Measure  Help needed turning from your back to your side while in a flat bed without using bedrails?: None Help needed moving from lying on your back to sitting on the side of a flat bed without using bedrails?: A Little Help needed moving to and from a bed to a chair (including a wheelchair)?: A Little Help needed standing up from a chair using your arms (e.g., wheelchair or bedside chair)?: A Little Help needed to walk in hospital room?: A Little Help needed climbing 3-5 steps with a railing? : A Lot 6 Click Score: 18    End of Session Equipment Utilized During Treatment: Gait belt Activity Tolerance: Patient limited by fatigue Patient left: in chair;with call bell/phone within reach;with chair alarm set;with nursing/sitter in room Nurse Communication: Mobility status PT Visit Diagnosis: Other abnormalities of gait and mobility (R26.89);Difficulty in walking, not elsewhere classified (R26.2);Muscle weakness (generalized) (M62.81)     Time: [id number] PT Time Calculation (min) (ACUTE ONLY): 23 min  Charges:  $Gait Training: 8-22 mins                     Jamy Whyte B. Migdalia Dk PT, DPT Acute Rehabilitation Services Pager [phone] Office [phone]    [place] 03/04/2019, 5:33 PM

## 2019-03-04 NOTE — Progress Notes (Signed)
Occupational Therapy Treatment Patient Details Name: XZAVIAN GAMBALE MRN: [id number] DOB: 1935-08-15 Today's Date: 03/04/2019    History of present illness 84 year old male with history of CAD status post stent in 2011, hypertension, hyperlipidemia, GERD, hypothyroidism presented with abdominal pain, nausea, vomiting with generalized weakness since his recent Covid diagnosis on Feb 09, 2020 (patient's wife passed away from COVID-60 2 weeks ago).In the ED, he was noted to be in A. fib with RVR with rates in 140s to 160s and small bowel obstruction.  Cardiology and general surgery were consulted.   OT comments  Pt progressing towards OT goals. Pt received sitting in recliner chair. Pt Min A for sit to stand with RW from recliner chair. Pt almost able to achieve full stance on this date, but required assistance during transition from hands on recliner armrest to hands on RW. Guided pt in mobility in room on RA using RW at [facility] guard + 2 for safety and equipment management with pt experiencing fatigue. Pt min guard for stand pivot to bed using RW. Pt performed grooming tasks seated EOB including oral care and combing hair after setup. Pt able to demo good sitting balance during ADL tasks for >5 minutes. Pt Min A for bed mobility to advance B LEs on to bed. Will continue to follow acutely.    Follow Up Recommendations  SNF;Supervision/Assistance - 24 hour    Equipment Recommendations  Other (comment)    Recommendations for Other Services      Precautions / Restrictions Precautions Precautions: Fall Precaution Comments: watch HR Restrictions Weight Bearing Restrictions: No       Mobility Bed Mobility               General bed mobility comments: in chair on arrival  Transfers Overall transfer level: Needs assistance Equipment used: Rolling walker (2 wheeled) Transfers: Sit to/from [facility] Sit to Stand: Min assist Stand pivot transfers: Min guard       General  transfer comment: Pt almost to full stance for sit to stand, but required Min A to achieve full upright position with RW.     Balance Overall balance assessment: Needs assistance Sitting-balance support: Feet supported;No upper extremity supported Sitting balance-Leahy Scale: Good     Standing balance support: Bilateral upper extremity supported Standing balance-Leahy Scale: Poor Standing balance comment: UE support for balance due to weakness/ flexed posture                           ADL either performed or assessed with clinical judgement   ADL   Eating/Feeding: Modified independent;Sitting   Grooming: Oral care;Brushing hair;Set up;Sitting Grooming Details (indicate cue type and reason): seated EOB                               General ADL Comments: Pt able to sit EOB for > 5 minutes to complete grooming tasks with no LOB      Vision       Perception     Praxis      Cognition Arousal/Alertness: Awake/alert Behavior During Therapy: WFL for tasks assessed/performed Overall Cognitive Status: Within Functional Limits for tasks assessed                                          Exercises  Shoulder Instructions       General Comments      Pertinent Vitals/ Pain       Pain Assessment: No/denies pain  Home Living                                          Prior Functioning/Environment              Frequency  Min 2X/week        Progress Toward Goals  OT Goals(current goals can now be found in the care plan section)  Progress towards OT goals: Progressing toward goals  Acute Rehab OT Goals Patient Stated Goal: to get stronger OT Goal Formulation: With patient Time For Goal Achievement: 03/05/19 Potential to Achieve Goals: Good ADL Goals Pt Will Perform Grooming: with modified independence;standing Pt Will Perform Lower Body Bathing: with modified independence;sit to/from stand;with  adaptive equipment Pt Will Perform Upper Body Dressing: with modified independence;sitting Pt Will Perform Lower Body Dressing: with modified independence;with adaptive equipment;sit to/from stand Pt Will Transfer to Toilet: with modified independence;ambulating Pt Will Perform Toileting - Clothing Manipulation and hygiene: with modified independence;sit to/from stand Pt/caregiver will Perform Home Exercise Program: Increased strength;Both right and left upper extremity;With written HEP provided;Independently  Plan Discharge plan remains appropriate    Co-evaluation    PT/OT/SLP Co-Evaluation/Treatment: Yes Reason for Co-Treatment: For patient/therapist safety   OT goals addressed during session: ADL's and self-care;Proper use of Adaptive equipment and DME      AM-PAC OT "6 Clicks" Daily Activity     Outcome Measure   Help from another person eating meals?: None Help from another person taking care of personal grooming?: A Little Help from another person toileting, which includes using toliet, bedpan, or urinal?: A Little Help from another person bathing (including washing, rinsing, drying)?: A Lot Help from another person to put on and taking off regular upper body clothing?: A Little Help from another person to put on and taking off regular lower body clothing?: A Lot 6 Click Score: 17    End of Session Equipment Utilized During Treatment: Gait belt;Rolling walker  OT Visit Diagnosis: Unsteadiness on feet (R26.81);Muscle weakness (generalized) (M62.81)   Activity Tolerance Patient tolerated treatment well   Patient Left in bed;with call bell/phone within reach   Nurse Communication          Time: [id number] OT Time Calculation (min): 23 min  Charges: OT General Charges $OT Visit: 1 Visit OT Treatments $Self Care/Home Management : 8-22 mins  Layla Maw, OTR/L   Layla Maw 03/04/2019, 3:50 PM

## 2019-03-04 NOTE — TOC Progression Note (Signed)
Transition of Care [facility]) - Progression Note    Patient Details  Name: Derek Foster MRN: [id number] Date of Birth: 08-17-1935  [facility] CM/SW Parker, LCSW Phone Number: 03/04/2019, 12:32 PM  Clinical Narrative:    Patient's SNF insurance approval expires today. Will contact [employer] for a new auth once patient is medically ready for discharge.    Expected Discharge Plan: [place] Barriers to Discharge: Continued Medical Work up  Expected Discharge Plan and Services Expected Discharge Plan: [place] In-house Referral: Clinical Social Work     Living arrangements for the past 2 months: Single Family Home Expected Discharge Date: 02/26/19                                     Social Determinants of Health (SDOH) Interventions    Readmission Risk Interventions No flowsheet data found.

## 2019-03-04 NOTE — Plan of Care (Signed)
  Problem: Education: Goal: Knowledge of General Education information will improve Description: Including pain rating scale, medication(s)/side effects and non-pharmacologic comfort measures Outcome: Progressing   Problem: Activity: Goal: Risk for activity intolerance will decrease Outcome: Progressing Problem: Activity: Goal: Ability to tolerate increased activity will improve Outcome: Progressing   Pt able to ambulate to chair x2 assist with walker. Up in chair for most of morning to afternoon. Education progressing, has appropriate questions for POC.

## 2019-03-04 NOTE — Progress Notes (Signed)
Flushed a couple blood clots from foley catheter after patient voiced discomfort.  Pain relieved immediately afterwards.  Irrigated fluids continue to be appear tea colored <--> bloody.  No acute changes otherwise.

## 2019-03-04 NOTE — Progress Notes (Signed)
Patient ID: Derek Foster, male   DOB: January 07, 1936, 84 y.o.   MRN: [id number]  PROGRESS NOTE    Derek Foster  [id number] DOB: 1935-11-19 DOA: 02/11/2019 PCP: Celene Squibb, MD   Brief Narrative:  84 year old male with history of CAD status post stent in 2011, hypertension, hyperlipidemia, GERD, hypothyroidism presented with abdominal pain, nausea, vomiting with generalized weakness since his recent Covid diagnosis on 02-03-20 (patient's wife passed away from COVID-8 2 weeks ago). In the ED, he was noted to be in A. fib with RVR with rates in 140s to 160s and small bowel obstruction.  Cardiology and general surgery were consulted.  Treated with amiodarone drip/Cardizem drip/IV metoprolol and digoxin.  SBO was treated with NG tube and bowel rest.  Patient was started on anticoagulation for his A. fib, unfortunately he did develop significant hematuria, and outlet obstruction, required Foley insertion and holding anticoagulation.   Subjective:  -Symptoms still denies any complaints, but per staff patient has been having blood clots coming from around his Foley catheter.  Assessment & Plan:   New onset paroxysmal atrial fibrillation/flutter with rapid ventricular response - hard to control rate, [place] vas 2 score of at least 3.   -Is on Eliquis, currently on hold due to hematuria , -Currently on  amiodarone, digoxin, beta-blocker and Cardizem for heart rate control . -I think this is primarily being driven by hyperthyroidism, methimazole dose has been increased further. RVR is improving.   Hyperthyroidism -TSH is still suppressed with increased T4, have increased methimazole dose.  Will require outpatient endocrine follow-up. Repeat TSH along with free T4 and T3 in 2 weeks time with outpatient endocrine follow-up.   Lab Results  Component Value Date   TSH 0.012 (L) 02/28/2019    New onset hematuria outlet obstruction.  -Is most likely due to acute hemorrhagic cystitis and full  anticoagulation . -Continue to hold Eliquis . -Despite 150 cc of water flushes every 3 hours, still having significant hematuria, and today's passing blood clots around his Foley , have discussed with Dr. McDermitt 2/11, patient was started on CBI, three-way 26 French Foley catheter inserted, still having frequent blood clots, so we will continue at current rate. -UTI with urine culture growing Klebsiella and Proteus, continue with IV Rocephin, will treat for at least 7 days -Continue with Flomax . -We will need to follow with urology as an outpatient .  Small bowel obstruction  - Recurrent ventral incisional hernia, likely due to underlying adhesions.  Required NG tube.  Now resolved.  Has been seen by general surgery.   Hypokalemia  -replaced and stable.  Acute kidney injury  - had improved but on 03/01/2019 slight bump, gently hydrate and monitor.  Thrombocytopenia  -resolved.  Leukocytosis -Most likely reactive.  Resolved  Recent COVID-19 infection  -Patient reports being diagnosed on 02/03/2023 at his PCP Dr. Josue Hector office completed 3-week isolation period on 1/21, repeat Covid PCR was positive on 1/22, suspect this is residual viral fragments, previous MD had called Dr. Josue Hector office and was able to confirm original positive Covid diagnosis on 03-Feb-2023.  Stable from the standpoint.   History of CAD   - Remote history of stents, -Stable  Hypertension  -Continue on Cardizem and beta-blocker combination.  GERD  -Continue oral PPI.  Generalized deconditioning    PT recommends SNF placement.  Social worker following.  Bereavement disorder -Wife passed last month due to Covid, patient appears to be depressed, will consult chaplain  DVT prophylaxis: [place] heparin Code Status: DNR Family Communication: Discussed with daughter via phone Disposition Plan: SNF placement once hematuria has resolved .  He remains on continuous bladder irrigation currently Consultants:  Cardiology/general surgery  Procedures: None  Antimicrobials:  None   Objective: Vitals:   03/03/19 2155 03/04/19 0723 03/04/19 0942 03/04/19 1322  BP: 135/68  120/79 134/60  Pulse: 68  64 72  Resp: 18 18 20    Temp: 98.2 F (36.8 C)  98.1 F (36.7 C) 97.6 F (36.4 C)  TempSrc: Oral  Oral Oral  SpO2: 98% 95% 96% 98%  Weight:      Height:        Intake/Output Summary (Last 24 hours) at 03/04/2019 1404 Last data filed at 03/04/2019 1311 Gross per 24 hour  Intake 31040 ml  Output 37110 ml  Net -6070 ml   Filed Weights   03/01/19 0525 03/02/19 0308 03/03/19 0437  Weight: 120.3 kg 124.3 kg 123.5 kg    Examination:  Awake Alert, Oriented X 3, No new F.N deficits, Normal affect Symmetrical Chest wall movement, Good air movement bilaterally, CTAB RRR,No Gallops,Rubs or new Murmurs, No Parasternal Heave +ve B.Sounds, Abd Soft, No tenderness, No rebound - guarding or rigidity. No Cyanosis, Clubbing or edema, No new Rash or bruise   Patient with dark pink urine in the Foley bag, on three-way CBI.       Data Reviewed: I have personally reviewed following labs and imaging studies  CBC: Recent Labs  Lab 02/27/19 0334 03/01/19 0457 03/02/19 0438 03/03/19 0737 03/04/19 0311  WBC 6.4 8.6 5.6 4.5 5.5  NEUTROABS 4.4 6.3 3.7 2.6 3.3  HGB 9.7* 9.6* 9.0* 9.0* 8.6*  HCT 29.0* 28.8* 27.2* 26.4* 25.8*  MCV 89.5 89.4 89.8 87.7 89.6  PLT 221 232 217 207 0000000   Basic Metabolic Panel: Recent Labs  Lab 02/26/19 0847 02/26/19 0847 02/27/19 0334 03/01/19 0457 03/02/19 0438 03/03/19 0737 03/04/19 0311  NA 137   < > 137 134* 131* 136 138  K 4.0   < > 3.7 3.9 3.8 3.8 3.9  CL 103   < > 103 101 100 102 102  CO2 23   < > 23 23 23 25 26   GLUCOSE 113*   < > 108* 114* 116* 117* 113*  BUN 17   < > 18 20 22 19 19   CREATININE 1.50*   < > 1.31* 1.60* 1.41* 1.15 1.38*  CALCIUM 8.4*   < > 8.1* 8.0* 7.9* 8.0* 8.2*  MG 1.8  --  1.7 1.9 1.9 2.0  --    < > = values in this interval not  displayed.   GFR: Estimated Creatinine Clearance: 56.8 mL/min (A) (by C-G formula based on SCr of 1.38 mg/dL (H)). Liver Function Tests: Recent Labs  Lab 03/01/19 0457 03/02/19 0438 03/03/19 0737 03/04/19 0311  AST 24 24 19 15   ALT 36 32 27 23  ALKPHOS 123 97 82 86  BILITOT 0.6 0.4 0.3 0.5  PROT 5.3* 5.2* 5.1* 5.0*  ALBUMIN 2.0* 1.8* 2.0* 2.0*   No results for input(s): LIPASE, AMYLASE in the last 168 hours. No results for input(s): AMMONIA in the last 168 hours. Coagulation Profile: No results for input(s): INR, PROTIME in the last 168 hours. Cardiac Enzymes: No results for input(s): CKTOTAL, CKMB, CKMBINDEX, TROPONINI in the last 168 hours. BNP (last 3 results) No results for input(s): PROBNP in the last 8760 hours. HbA1C: No results for input(s): HGBA1C in the last 72 hours.  CBG: No results for input(s): GLUCAP in the last 168 hours. Lipid Profile: No results for input(s): CHOL, HDL, LDLCALC, TRIG, CHOLHDL, LDLDIRECT in the last 72 hours. Thyroid Function Tests: No results for input(s): TSH, T4TOTAL, FREET4, T3FREE, THYROIDAB in the last 72 hours. Anemia Panel: No results for input(s): VITAMINB12, FOLATE, FERRITIN, TIBC, IRON, RETICCTPCT in the last 72 hours. Sepsis Labs: No results for input(s): PROCALCITON, LATICACIDVEN in the last 168 hours.  Recent Results (from the past 240 hour(s))  Culture, Urine     Status: Abnormal   Collection Time: 02/27/19  9:36 AM   Specimen: Urine, Random  Result Value Ref Range Status   Specimen Description URINE, RANDOM  Final   Special Requests   Final    NONE Performed at [facility], [address], [place], [place] [address]    Culture (A)  Final    >=100,000 COLONIES/mL PROTEUS MIRABILIS 90,000 COLONIES/mL KLEBSIELLA OXYTOCA    Report Status 03/02/2019 FINAL  Final   Organism ID, Bacteria KLEBSIELLA OXYTOCA (A)  Final   Organism ID, Bacteria PROTEUS MIRABILIS (A)  Final      Susceptibility   Klebsiella oxytoca -  MIC*    AMPICILLIN >=32 RESISTANT Resistant     CEFAZOLIN >=64 RESISTANT Resistant     CEFTRIAXONE <=0.25 SENSITIVE Sensitive     CIPROFLOXACIN <=0.25 SENSITIVE Sensitive     GENTAMICIN <=1 SENSITIVE Sensitive     IMIPENEM <=0.25 SENSITIVE Sensitive     NITROFURANTOIN <=16 SENSITIVE Sensitive     TRIMETH/SULFA <=20 SENSITIVE Sensitive     AMPICILLIN/SULBACTAM 16 INTERMEDIATE Intermediate     PIP/TAZO <=4 SENSITIVE Sensitive     * 90,000 COLONIES/mL KLEBSIELLA OXYTOCA   Proteus mirabilis - MIC*    AMPICILLIN <=2 SENSITIVE Sensitive     CEFAZOLIN <=4 SENSITIVE Sensitive     CEFTRIAXONE <=0.25 SENSITIVE Sensitive     CIPROFLOXACIN 1 SENSITIVE Sensitive     GENTAMICIN <=1 SENSITIVE Sensitive     IMIPENEM 4 SENSITIVE Sensitive     NITROFURANTOIN 256 RESISTANT Resistant     TRIMETH/SULFA <=20 SENSITIVE Sensitive     AMPICILLIN/SULBACTAM <=2 SENSITIVE Sensitive     PIP/TAZO <=4 SENSITIVE Sensitive     * >=100,000 COLONIES/mL PROTEUS MIRABILIS       Radiology Studies: No results found.   Scheduled Meds: . amiodarone  200 mg Oral BID  . Chlorhexidine Gluconate Cloth  6 each Topical Daily  . digoxin  0.125 mg Oral Daily  . diltiazem  360 mg Oral Daily  . docusate sodium  100 mg Oral BID  . feeding supplement  1 Container Oral BID BM  . feeding supplement (ENSURE ENLIVE)  237 mL Oral Q1500  . guaiFENesin  15 mL Per Tube Q6H  . heparin injection (subcutaneous)  5,000 Units Subcutaneous Q8H  . methimazole  20 mg Oral TID  . metoprolol tartrate  200 mg Oral BID  . multivitamin with minerals  1 tablet Oral Daily  . pantoprazole  40 mg Oral BID  . pravastatin  10 mg Oral q1800  . tamsulosin  0.4 mg Oral Daily   Continuous Infusions: . cefTRIAXone (ROCEPHIN)  IV 1 g (03/04/19 1010)  . sodium chloride irrigation      Phillips Climes, MD [facility] 03/04/2019, 2:04 PM

## 2019-03-05 DIAGNOSIS — D62 Acute posthemorrhagic anemia: Secondary | ICD-10-CM

## 2019-03-05 LAB — CBC
HCT: 24.9 % — ABNORMAL LOW (ref 39.0–52.0)
Hemoglobin: 8.1 g/dL — ABNORMAL LOW (ref 13.0–17.0)
MCH: 29.2 pg (ref 26.0–34.0)
MCHC: 32.5 g/dL (ref 30.0–36.0)
MCV: 89.9 fL (ref 80.0–100.0)
Platelets: 206 10*3/uL (ref 150–400)
RBC: 2.77 MIL/uL — ABNORMAL LOW (ref 4.22–5.81)
RDW: 13.9 % (ref 11.5–15.5)
WBC: 5.1 10*3/uL (ref 4.0–10.5)
nRBC: 0 % (ref 0.0–0.2)

## 2019-03-05 LAB — BASIC METABOLIC PANEL
Anion gap: 8 (ref 5–15)
BUN: 14 mg/dL (ref 8–23)
CO2: 25 mmol/L (ref 22–32)
Calcium: 8.2 mg/dL — ABNORMAL LOW (ref 8.9–10.3)
Chloride: 103 mmol/L (ref 98–111)
Creatinine, Ser: 1.24 mg/dL (ref 0.61–1.24)
GFR calc Af Amer: >60 mL/min (ref >60)
GFR calc non Af Amer: 54 mL/min — ABNORMAL LOW (ref >60)
Glucose, Bld: 109 mg/dL — ABNORMAL HIGH (ref 70–99)
Potassium: 3.9 mmol/L (ref 3.5–5.1)
Sodium: 136 mmol/L (ref 135–145)

## 2019-03-05 MED ORDER — SODIUM CHLORIDE 0.9% FLUSH
10.0000 mL | Freq: Two times a day (BID) | INTRAVENOUS | Status: DC
  Administered 2019-03-05 – 2019-03-06 (×3): 10 mL
  Administered 2019-03-07: 40 mL
  Administered 2019-03-07 – 2019-03-10 (×4): 10 mL

## 2019-03-05 MED ORDER — SODIUM CHLORIDE 0.9% FLUSH
10.0000 mL | INTRAVENOUS | Status: DC | PRN
  Administered 2019-03-12: 10 mL

## 2019-03-05 NOTE — Progress Notes (Signed)
   Vital Signs MEWS/VS Documentation      03/04/2019 1322 03/04/2019 1646 03/04/2019 2111 03/04/2019 2139   MEWS Score:  0  0  0  0   MEWS Score Color:  Green  Green  Green  Green   Resp:  --  20  --  18   Pulse:  72  69  --  61   BP:  134/60  (!) 116/49  --  (!) 132/48   Temp:  97.6 F (36.4 C)  --  --  98.7 F (37.1 C)   O2 Device:  Room Air  Room Air  --  Room Air   Level of Consciousness:  --  Alert  Alert  --           Harl Bowie 03/05/2019,2:12 AM

## 2019-03-05 NOTE — Progress Notes (Signed)
Called to Derek Foster' room to talk with him. He requested prayer for his health. Derek Foster and I prayed together over the phone. Will continue to be available for him as he needs.  Rev. Drayton.

## 2019-03-05 NOTE — Progress Notes (Signed)
Pt on CBI and complaining of pressure because of foley catheter. No output in the bag. 269ml after bladder scan. On call MD notified. Foley d/c and there appeared to be a clot at the tip. Pt tolerated it well. Order obtained to place a new one. Pt given a urinal until new foley placed. Will continue to monitor.

## 2019-03-05 NOTE — Progress Notes (Signed)
Offered to call patient's point of contact and update regarding his status and plan of care.  Patient is alert and oriented, and stated that he had already brought his family up to date on his plan of care.

## 2019-03-05 NOTE — Plan of Care (Signed)
  Problem: Education: Goal: Knowledge of General Education information will improve Description: Including pain rating scale, medication(s)/side effects and non-pharmacologic comfort measures Outcome: Progressing   Problem: Activity: Goal: Risk for activity intolerance will decrease Outcome: Progressing   Problem: Education: Goal: Knowledge of risk factors and measures for prevention of condition will improve Outcome: Progressing   Problem: Respiratory: Goal: Will maintain a patent airway Outcome: Progressing Goal: Complications related to the disease process, condition or treatment will be avoided or minimized Outcome: Progressing   Problem: Education: Goal: Knowledge of disease or condition will improve Outcome: Progressing Goal: Understanding of medication regimen will improve Outcome: Progressing Goal: Individualized Educational Video(s) Outcome: Progressing   Problem: Activity: Goal: Ability to tolerate increased activity will improve Outcome: Progressing   Problem: Cardiac: Goal: Ability to achieve and maintain adequate cardiopulmonary perfusion will improve Outcome: Progressing   Problem: Health Behavior/Discharge Planning: Goal: Ability to safely manage health-related needs after discharge will improve Outcome: Progressing

## 2019-03-05 NOTE — Progress Notes (Signed)
Patient ID: DEMTRIUS Foster, male   DOB: 03/10/1935, 84 y.o.   MRN: [id number]  PROGRESS NOTE    Derek Foster  [id number] DOB: 1935-02-14 DOA: 02/11/2019 PCP: Derek Squibb, MD   Brief Narrative:  84 year old male with history of CAD status post stent in 2011, hypertension, hyperlipidemia, GERD, hypothyroidism presented with abdominal pain, nausea, vomiting with generalized weakness since his recent Covid diagnosis on 01/25/2020 (patient's wife passed away from COVID-88 2 weeks ago). In the ED, he was noted to be in A. fib with RVR with rates in 140s to 160s and small bowel obstruction.  Cardiology and general surgery were consulted.  Treated with amiodarone drip/Cardizem drip/IV metoprolol and digoxin.  SBO was treated with NG tube and bowel rest.  Patient was started on anticoagulation for his A. fib, unfortunately he did develop significant hematuria, and outlet obstruction, required Foley insertion and holding anticoagulation.   Subjective:  -Symptoms still denies any complaints, patient reports no further blood clots, no further bladder spasms as well .  Assessment & Plan:   New onset paroxysmal atrial fibrillation/flutter with rapid ventricular response - hard to control rate, [place] vas 2 score of at least 3.   -Is on Eliquis, currently on hold due to hematuria , -Currently on  amiodarone, digoxin, beta-blocker and Cardizem for heart rate control . -I think this is primarily being driven by hyperthyroidism, methimazole dose has been increased further. RVR is improving.   Hyperthyroidism -TSH is still suppressed with increased T4, have increased methimazole dose.  Will require outpatient endocrine follow-up. Repeat TSH along with free T4 and T3 in 2 weeks time with outpatient endocrine follow-up.   Lab Results  Component Value Date   TSH 0.012 (L) 02/28/2019    New onset hematuria outlet obstruction.  -Is most likely due to acute hemorrhagic cystitis and full anticoagulation  . -Continue to hold Eliquis . -Despite 150 cc of water flushes every 3 hours, still having significant hematuria, and today's passing blood clots around his Foley , have discussed with Dr. McDermitt 2/11, patient was started on CBI, three-way 36 French Foley catheter inserted, there is no blood clots over last 24 hours, so we will decrease CBI rate to medium . -UTI with urine culture growing Klebsiella and Foster, continue with IV Rocephin, will treat for at 7-10  days -Continue with Flomax . -We will need to follow with urology as an outpatient .  Acute blood loss anemia -This appears to be secondary to persistent hematuria, even though hematuria is improving, still pink color urine, will transfuse for hemoglobin less than than 8.  Small bowel obstruction  - Recurrent ventral incisional hernia, likely due to underlying adhesions.  Required NG tube.  Now resolved.  Has been seen by general surgery.   Hypokalemia  -replaced and stable.  Acute kidney injury  - had improved but on 03/01/2019 slight bump, gently hydrate and monitor.  Thrombocytopenia  -resolved.  Leukocytosis -Most likely reactive.  Resolved  Recent COVID-19 infection  -Patient reports being diagnosed on 01-25-23 at his PCP Dr. Josue Foster office completed 3-week isolation period on 1/21, repeat Covid PCR was positive on 1/22, suspect this is residual viral fragments, previous MD had called Dr. Josue Foster office and was able to confirm original positive Covid diagnosis on 2023/01/25.  Stable from the standpoint.   History of CAD   - Remote history of stents, -Stable  Hypertension  -Continue on Cardizem and beta-blocker combination.  GERD  -Continue oral PPI.  Generalized deconditioning    PT recommends SNF placement.  Social worker following.  Bereavement disorder -Wife passed last month due to Covid, patient appears to be depressed, will consult chaplain     DVT prophylaxis: Eek heparin Code Status: DNR Family  Communication: Discussed with daughter via phone 03/04/2019 Disposition Plan: SNF placement once hematuria has resolved .  He remains on continuous bladder irrigation currently Consultants: Cardiology/general surgery  Procedures: None  Antimicrobials:  None   Objective: Vitals:   03/04/19 1646 03/04/19 2139 03/05/19 0627 03/05/19 0931  BP: (!) 116/49 (!) 132/48 139/62   Pulse: 69 61 75 76  Resp: 20 18 18    Temp:  98.7 F (37.1 C) 98.4 F (36.9 C)   TempSrc:  Oral Oral   SpO2: 96% 98% 95%   Weight:      Height:        Intake/Output Summary (Last 24 hours) at 03/05/2019 1111 Last data filed at 03/05/2019 1000 Gross per 24 hour  Intake 21540 ml  Output 30250 ml  Net -8710 ml   Filed Weights   03/01/19 0525 03/02/19 0308 03/03/19 0437  Weight: 120.3 kg 124.3 kg 123.5 kg    Examination:  Awake Alert, Oriented X 3, No new F.N deficits, Normal affect Symmetrical Chest wall movement, Good air movement bilaterally, CTAB RRR,No Gallops,Rubs or new Murmurs, No Parasternal Heave +ve B.Sounds, Abd Soft, No tenderness, No rebound - guarding or rigidity. No Cyanosis, Clubbing , TRACE edema, No new Rash or bruise   Pink urine in the Foley bag, on file CBI, no blood clots.   Data Reviewed: I have personally reviewed following labs and imaging studies  CBC: Recent Labs  Lab 02/27/19 0334 02/27/19 0334 03/01/19 0457 03/02/19 0438 03/03/19 0737 03/04/19 0311 03/05/19 0721  WBC 6.4   < > 8.6 5.6 4.5 5.5 5.1  NEUTROABS 4.4  --  6.3 3.7 2.6 3.3  --   HGB 9.7*   < > 9.6* 9.0* 9.0* 8.6* 8.1*  HCT 29.0*   < > 28.8* 27.2* 26.4* 25.8* 24.9*  MCV 89.5   < > 89.4 89.8 87.7 89.6 89.9  PLT 221   < > 232 217 207 233 206   < > = values in this interval not displayed.   Basic Metabolic Panel: Recent Labs  Lab 02/27/19 0334 02/27/19 0334 03/01/19 0457 03/02/19 0438 03/03/19 0737 03/04/19 0311 03/05/19 0721  NA 137   < > 134* 131* 136 138 136  K 3.7   < > 3.9 3.8 3.8 3.9 3.9    CL 103   < > 101 100 102 102 103  CO2 23   < > 23 23 25 26 25   GLUCOSE 108*   < > 114* 116* 117* 113* 109*  BUN 18   < > 20 22 19 19 14   CREATININE 1.31*   < > 1.60* 1.41* 1.15 1.38* 1.24  CALCIUM 8.1*   < > 8.0* 7.9* 8.0* 8.2* 8.2*  MG 1.7  --  1.9 1.9 2.0  --   --    < > = values in this interval not displayed.   GFR: Estimated Creatinine Clearance: 63.2 mL/min (by C-G formula based on SCr of 1.24 mg/dL). Liver Function Tests: Recent Labs  Lab 03/01/19 0457 03/02/19 0438 03/03/19 0737 03/04/19 0311  AST 24 24 19 15   ALT 36 32 27 23  ALKPHOS 123 97 82 86  BILITOT 0.6 0.4 0.3 0.5  PROT 5.3* 5.2* 5.1* 5.0*  ALBUMIN 2.0*  1.8* 2.0* 2.0*   No results for input(s): LIPASE, AMYLASE in the last 168 hours. No results for input(s): AMMONIA in the last 168 hours. Coagulation Profile: No results for input(s): INR, PROTIME in the last 168 hours. Cardiac Enzymes: No results for input(s): CKTOTAL, CKMB, CKMBINDEX, TROPONINI in the last 168 hours. BNP (last 3 results) No results for input(s): PROBNP in the last 8760 hours. HbA1C: No results for input(s): HGBA1C in the last 72 hours. CBG: No results for input(s): GLUCAP in the last 168 hours. Lipid Profile: No results for input(s): CHOL, HDL, LDLCALC, TRIG, CHOLHDL, LDLDIRECT in the last 72 hours. Thyroid Function Tests: No results for input(s): TSH, T4TOTAL, FREET4, T3FREE, THYROIDAB in the last 72 hours. Anemia Panel: No results for input(s): VITAMINB12, FOLATE, FERRITIN, TIBC, IRON, RETICCTPCT in the last 72 hours. Sepsis Labs: No results for input(s): PROCALCITON, LATICACIDVEN in the last 168 hours.  Recent Results (from the past 240 hour(s))  Culture, Urine     Status: Abnormal   Collection Time: 02/27/19  9:36 AM   Specimen: Urine, Random  Result Value Ref Range Status   Specimen Description URINE, RANDOM  Final   Special Requests   Final    NONE Performed at [facility], [address], [place], [place]  [address]    Culture (A)  Final    >=100,000 COLONIES/mL Foster MIRABILIS 90,000 COLONIES/mL KLEBSIELLA OXYTOCA    Report Status 03/02/2019 FINAL  Final   Organism ID, Bacteria KLEBSIELLA OXYTOCA (A)  Final   Organism ID, Bacteria Foster MIRABILIS (A)  Final      Susceptibility   Klebsiella oxytoca - MIC*    AMPICILLIN >=32 RESISTANT Resistant     CEFAZOLIN >=64 RESISTANT Resistant     CEFTRIAXONE <=0.25 SENSITIVE Sensitive     CIPROFLOXACIN <=0.25 SENSITIVE Sensitive     GENTAMICIN <=1 SENSITIVE Sensitive     IMIPENEM <=0.25 SENSITIVE Sensitive     NITROFURANTOIN <=16 SENSITIVE Sensitive     TRIMETH/SULFA <=20 SENSITIVE Sensitive     AMPICILLIN/SULBACTAM 16 INTERMEDIATE Intermediate     PIP/TAZO <=4 SENSITIVE Sensitive     * 90,000 COLONIES/mL KLEBSIELLA OXYTOCA   Foster mirabilis - MIC*    AMPICILLIN <=2 SENSITIVE Sensitive     CEFAZOLIN <=4 SENSITIVE Sensitive     CEFTRIAXONE <=0.25 SENSITIVE Sensitive     CIPROFLOXACIN 1 SENSITIVE Sensitive     GENTAMICIN <=1 SENSITIVE Sensitive     IMIPENEM 4 SENSITIVE Sensitive     NITROFURANTOIN 256 RESISTANT Resistant     TRIMETH/SULFA <=20 SENSITIVE Sensitive     AMPICILLIN/SULBACTAM <=2 SENSITIVE Sensitive     PIP/TAZO <=4 SENSITIVE Sensitive     * >=100,000 COLONIES/mL Foster MIRABILIS       Radiology Studies: No results found.   Scheduled Meds: . amiodarone  200 mg Oral BID  . Chlorhexidine Gluconate Cloth  6 each Topical Daily  . digoxin  0.125 mg Oral Daily  . diltiazem  360 mg Oral Daily  . docusate sodium  100 mg Oral BID  . feeding supplement  1 Container Oral BID BM  . feeding supplement (ENSURE ENLIVE)  237 mL Oral Q1500  . guaiFENesin  15 mL Per Tube Q6H  . heparin injection (subcutaneous)  5,000 Units Subcutaneous Q8H  . methimazole  20 mg Oral TID  . metoprolol tartrate  200 mg Oral BID  . multivitamin with minerals  1 tablet Oral Daily  . pantoprazole  40 mg Oral BID  . pravastatin  10 mg Oral q1800  .  tamsulosin  0.4 mg Oral Daily   Continuous Infusions: . cefTRIAXone (ROCEPHIN)  IV 1 g (03/05/19 0931)  . sodium chloride irrigation      Phillips Climes, MD [facility] 03/05/2019, 11:11 AM

## 2019-03-05 NOTE — Progress Notes (Signed)
Pt stated there was no need to call family due to him already speaking with them. Pt is AAOx4. Will continue to monitor.

## 2019-03-06 LAB — CBC
HCT: 25.3 % — ABNORMAL LOW (ref 39.0–52.0)
Hemoglobin: 8.3 g/dL — ABNORMAL LOW (ref 13.0–17.0)
MCH: 29.5 pg (ref 26.0–34.0)
MCHC: 32.8 g/dL (ref 30.0–36.0)
MCV: 90 fL (ref 80.0–100.0)
Platelets: 218 10*3/uL (ref 150–400)
RBC: 2.81 MIL/uL — ABNORMAL LOW (ref 4.22–5.81)
RDW: 13.9 % (ref 11.5–15.5)
WBC: 8.7 10*3/uL (ref 4.0–10.5)
nRBC: 0 % (ref 0.0–0.2)

## 2019-03-06 LAB — BASIC METABOLIC PANEL
Anion gap: 9 (ref 5–15)
BUN: 14 mg/dL (ref 8–23)
CO2: 24 mmol/L (ref 22–32)
Calcium: 8.1 mg/dL — ABNORMAL LOW (ref 8.9–10.3)
Chloride: 104 mmol/L (ref 98–111)
Creatinine, Ser: 1.3 mg/dL — ABNORMAL HIGH (ref 0.61–1.24)
GFR calc Af Amer: 59 mL/min — ABNORMAL LOW (ref >60)
GFR calc non Af Amer: 51 mL/min — ABNORMAL LOW (ref >60)
Glucose, Bld: 115 mg/dL — ABNORMAL HIGH (ref 70–99)
Potassium: 3.6 mmol/L (ref 3.5–5.1)
Sodium: 137 mmol/L (ref 135–145)

## 2019-03-06 LAB — TYPE AND SCREEN
ABO/RH(D): A POS
Antibody Screen: NEGATIVE

## 2019-03-06 LAB — ABO/RH: ABO/RH(D): A POS

## 2019-03-06 MED ORDER — SODIUM CHLORIDE 0.9 % IR SOLN
3000.0000 mL | Status: DC
  Administered 2019-03-06 – 2019-03-07 (×3): 3000 mL

## 2019-03-06 MED ORDER — MORPHINE SULFATE (PF) 2 MG/ML IV SOLN
2.0000 mg | Freq: Once | INTRAVENOUS | Status: AC
  Administered 2019-03-06: 2 mg via INTRAVENOUS
  Filled 2019-03-06: qty 1

## 2019-03-06 MED ORDER — LIDOCAINE HCL URETHRAL/MUCOSAL 2 % EX GEL
1.0000 "application " | Freq: Once | CUTANEOUS | Status: AC
  Administered 2019-03-06: 1 via URETHRAL
  Filled 2019-03-06: qty 20

## 2019-03-06 MED ORDER — BELLADONNA ALKALOIDS-OPIUM 16.2-60 MG RE SUPP
1.0000 | RECTAL | Status: AC
  Administered 2019-03-06: 1 via RECTAL

## 2019-03-06 MED ORDER — SODIUM CHLORIDE 0.9 % IR SOLN
3000.0000 mL | Status: DC
  Administered 2019-03-06: 3000 mL

## 2019-03-06 NOTE — Progress Notes (Addendum)
Occupational Therapy Treatment Patient Details Name: Derek Foster MRN: [id number] DOB: Dec 06, 1935 Today's Date: 03/06/2019    History of present illness 84 year old male with history of CAD status post stent in 2011, hypertension, hyperlipidemia, GERD, hypothyroidism presented with abdominal pain, nausea, vomiting with generalized weakness since his recent Covid diagnosis on 02/04/2020 (patient's wife passed away from COVID-19 2 weeks ago).In the ED, he was noted to be in A. fib with RVR with rates in 140s to 160s and small bowel obstruction.  Cardiology and general surgery were consulted.   OT comments  Pt presents supine in bed, reports too fatigued too attempt OOB today due to fatigue, reporting he was awake most of the night while staff working with foley care. Pt is agreeable to bed level exercises; issued level 2 theraband and pt engaging in bil UE and LE therex with min cues for technique. Pt requiring intermittent rest breaks due to fatigue. Pt appreciative of working with therapist, reporting son is coming to visit tomorrow now that he has transitioned from the covid unit. Will continue per POC at this time.   Follow Up Recommendations  SNF;Supervision/Assistance - 24 hour    Equipment Recommendations  Other (comment)(TBD)          Precautions / Restrictions Precautions Precautions: Fall Restrictions Weight Bearing Restrictions: No              ADL either performed or assessed with clinical judgement   ADL Overall ADL's : Needs assistance/impaired Eating/Feeding: Modified independent;Sitting                                     General ADL Comments: pt declining OOB today reports too fatigued as he was up most of the night while staff working on foley needs, agreeable to bed level exercises                       Cognition Arousal/Alertness: Awake/alert Behavior During Therapy: WFL for tasks assessed/performed Overall Cognitive Status: Within  Functional Limits for tasks assessed                                          Exercises Exercises: General Upper Extremity;General Lower Extremity General Exercises - Upper Extremity Shoulder Horizontal ABduction: AROM;Both;10 reps;Theraband Theraband Level (Shoulder Horizontal Abduction): Level 2 (Red) Shoulder Horizontal ADduction: AROM;Both;5 reps;Theraband Theraband Level (Shoulder Horizontal Adduction): Level 2 (Red) Elbow Flexion: AROM;Both;10 reps;Theraband Theraband Level (Elbow Flexion): Level 2 (Red) Elbow Extension: AROM;Both;10 reps;Theraband Theraband Level (Elbow Extension): Level 2 (Red) General Exercises - Lower Extremity Ankle Circles/Pumps: AROM;Both;10 reps Straight Leg Raises: AROM;Both;10 reps Hip Flexion/Marching: AAROM;Both;10 reps   Shoulder Instructions       General Comments      Pertinent Vitals/ Pain       Pain Assessment: Faces Faces Pain Scale: Hurts a little bit Pain Location: generalized Pain Descriptors / Indicators: Discomfort Pain Intervention(s): Limited activity within patient's tolerance;Monitored during session  Home Living                                          Prior Functioning/Environment              Frequency  Min 2X/week  Progress Toward Goals  OT Goals(current goals can now be found in the care plan section)  Progress towards OT goals: Progressing toward goals  Acute Rehab OT Goals Patient Stated Goal: to get stronger OT Goal Formulation: With patient Time For Goal Achievement: 03/20/19 Potential to Achieve Goals: Good ADL Goals Pt Will Perform Grooming: with modified independence;standing Pt Will Perform Lower Body Bathing: with modified independence;sit to/from stand;with adaptive equipment Pt Will Perform Upper Body Dressing: with modified independence;sitting Pt Will Perform Lower Body Dressing: with modified independence;with adaptive equipment;sit to/from  stand Pt Will Transfer to Toilet: with modified independence;ambulating Pt Will Perform Toileting - Clothing Manipulation and hygiene: with modified independence;sit to/from stand Pt/caregiver will Perform Home Exercise Program: Increased strength;Both right and left upper extremity;With written HEP provided;Independently  Plan Discharge plan remains appropriate    Co-evaluation                 AM-PAC OT "6 Clicks" Daily Activity     Outcome Measure   Help from another person eating meals?: None Help from another person taking care of personal grooming?: A Little Help from another person toileting, which includes using toliet, bedpan, or urinal?: A Little Help from another person bathing (including washing, rinsing, drying)?: A Lot Help from another person to put on and taking off regular upper body clothing?: A Little Help from another person to put on and taking off regular lower body clothing?: A Lot 6 Click Score: 17    End of Session    OT Visit Diagnosis: Unsteadiness on feet (R26.81);Muscle weakness (generalized) (M62.81)   Activity Tolerance Patient tolerated treatment well;Patient limited by fatigue   Patient Left in bed;with call bell/phone within reach   Nurse Communication Mobility status        Time: [id number] OT Time Calculation (min): 19 min  Charges: OT General Charges $OT Visit: 1 Visit OT Treatments $Therapeutic Activity: 8-22 mins  Lou Cal, OT [facility] Pager [phone] Office [phone]    Derek Foster 03/06/2019, 5:38 PM

## 2019-03-06 NOTE — Progress Notes (Signed)
Continuous Bladder Irrigation: Total output=3050 CBI In         =2700 ________________ Urine           =350 cc

## 2019-03-06 NOTE — Progress Notes (Signed)
On call MD returned page, and said to get in touch with the urology cath team. Will continue to monitor.

## 2019-03-06 NOTE — Consult Note (Signed)
Urology Consult Note   Requesting Attending Physician:  Albertine Patricia, MD Service Providing Consult: Urology  Consulting Attending: Duaine Dredge   Reason for Consult: Hematuria  HPI: Derek Foster is seen in consultation for reasons noted above at the request of Elgergawy, Silver Huguenin, MD  This is a 83 y.o. male with history of CAD (stent in 2011), hypertension, hyperlipidemia, GERD who was admitted to the hospital in February 14, 2018 with Covid pneumonia.  He has recovered from Covid and was transferred to the regular floor on 03/06/2019 on standard precautions.  Of note his wife unfortunately passed away from Covid in 02-14-2022 when they were diagnosed simultaneously.  His hospital course has been complicated by A. fib with RVR and a small bowel obstruction.  Small bowel obstruction was treated with NG tube and bowel rest.  Now resolved.  For his A. fib he was put on Cardizem drip and metoprolol and digoxin.  He also developed hematuria over the last 2 weeks..  Eliquis was held and he was started on three-way irrigation at one point with a small lumen catheter.  However he was not assessed by a urologist and he never had a hematuria catheter placed.  It has been difficult to get his hematuria under control and urology was consulted on 03/06/2019 for assistance.  On evaluation he was comfortable and had a condom cath on with dark red-colored urine.  After sterile prepping and draping a 24 French hematuria catheter was inserted up to the hub with return of dark red urine.  15 cc of sterile water were placed in the balloon.  Catheter was then hand irrigated with 1 L of sterile water with removal of approximately 200 cc of clot.  At the end of hand irrigation the urine was clear and without clot.  CBI was initiated on a moderate drip.  He is a prior patient of Dr. Gaynelle Arabian and underwent a TUNA procedure around 2010.  He does not take any medications for his prostate currently.  He has never had urinary  retention.  He had a prostate biopsy around 2016  that was negative per his report.  He does not currently have a urologist.  He is retired and lives in [place].   Past Medical History: Past Medical History:  Diagnosis Date  . Acute myocardial infarction, unspecified site, episode of care unspecified   . CAD in native artery    a. cath 01/27/2009 : s/p promus DES to LAD and medical managment of 70-80% mRCA  . Edema   . HTN (hypertension)   . Mixed hyperlipidemia   . Unspecified hypothyroidism     Past Surgical History:  Past Surgical History:  Procedure Laterality Date  . CHOLECYSTECTOMY    . CORONARY STENT PLACEMENT    . HERNIA REPAIR      Medication: Current Facility-Administered Medications  Medication Dose Route Frequency Provider Last Rate Last Admin  . acetaminophen (TYLENOL) tablet 650 mg  650 mg Oral Q6H PRN Elgergawy, Silver Huguenin, MD   650 mg at 02/28/19 0356  . amiodarone (PACERONE) tablet 200 mg  200 mg Oral BID Jerline Pain, MD   200 mg at 03/06/19 1014  . cefTRIAXone (ROCEPHIN) 1 g in sodium chloride 0.9 % 100 mL IVPB  1 g Intravenous Q24H Elgergawy, Silver Huguenin, MD 200 mL/hr at 03/06/19 1019 1 g at 03/06/19 1019  . Chlorhexidine Gluconate Cloth 2 % PADS 6 each  6 each Topical Daily Thurnell Lose, MD   6 each  at 03/06/19 1020  . digoxin (LANOXIN) tablet 0.125 mg  0.125 mg Oral Daily Lala Lund K, MD   0.125 mg at 03/06/19 1014  . diltiazem (CARDIZEM CD) 24 hr capsule 360 mg  360 mg Oral Daily Thurnell Lose, MD   360 mg at 03/06/19 1013  . docusate sodium (COLACE) capsule 100 mg  100 mg Oral BID Jillyn Ledger, PA-C   Stopped at 03/06/19 1020  . feeding supplement (BOOST / RESOURCE BREEZE) liquid 1 Container  1 Container Oral BID BM Aline August, MD   1 Container at 03/06/19 1022  . feeding supplement (ENSURE ENLIVE) (ENSURE ENLIVE) liquid 237 mL  237 mL Oral Q1500 Thurnell Lose, MD   Stopped at 03/04/19 1500  . guaiFENesin (ROBITUSSIN) 100 MG/5ML  solution 300 mg  15 mL Per Tube Q6H Domenic Polite, MD   Stopped at 03/06/19 1159  . guaiFENesin-dextromethorphan (ROBITUSSIN DM) 100-10 MG/5ML syrup 15 mL  15 mL Per Tube Q4H PRN Lovey Newcomer T, NP   15 mL at 02/25/19 2221  . heparin injection 5,000 Units  5,000 Units Subcutaneous Q8H Elgergawy, Silver Huguenin, MD   5,000 Units at 03/06/19 0513  . HYDROcodone-acetaminophen (NORCO/VICODIN) 5-325 MG per tablet 1-2 tablet  1-2 tablet Oral Q6H PRN Vertis Kelch, NP   2 tablet at 03/06/19 1014  . menthol-cetylpyridinium (CEPACOL) lozenge 3 mg  1 lozenge Oral PRN Maczis, Barth Kirks, PA-C      . methimazole (TAPAZOLE) tablet 20 mg  20 mg Oral TID Thurnell Lose, MD   20 mg at 03/06/19 1013  . metoprolol tartrate (LOPRESSOR) tablet 200 mg  200 mg Oral BID Thurnell Lose, MD   200 mg at 03/06/19 [id number]  . multivitamin with minerals tablet 1 tablet  1 tablet Oral Daily Aline August, MD   1 tablet at 03/06/19 1014  . ondansetron (ZOFRAN) injection 4 mg  4 mg Intravenous Q6H PRN Elgergawy, Silver Huguenin, MD   4 mg at 02/28/19 1001  . pantoprazole (PROTONIX) EC tablet 40 mg  40 mg Oral BID Aline August, MD   40 mg at 03/06/19 1013  . phenol (CHLORASEPTIC) mouth spray 1 spray  1 spray Mouth/Throat PRN Domenic Polite, MD      . polyethylene glycol (MIRALAX / GLYCOLAX) packet 17 g  17 g Oral Daily PRN Maczis, Barth Kirks, PA-C      . pravastatin (PRAVACHOL) tablet 10 mg  10 mg Oral q1800 Thurnell Lose, MD   10 mg at 03/05/19 1741  . sodium chloride flush (NS) 0.9 % injection 10-40 mL  10-40 mL Intracatheter Q12H Elgergawy, Silver Huguenin, MD   10 mL at 03/06/19 1022  . sodium chloride flush (NS) 0.9 % injection 10-40 mL  10-40 mL Intracatheter PRN Elgergawy, Silver Huguenin, MD      . sodium chloride irrigation 0.9 % 3,000 mL  3,000 mL Irrigation Continuous Franco Duley, Elisabeth Cara, MD      . tamsulosin (FLOMAX) capsule 0.4 mg  0.4 mg Oral Daily Thurnell Lose, MD   0.4 mg at 03/06/19 1014    Allergies: Allergies   Allergen Reactions  . Indomethacin Anaphylaxis    But tolerates ibuprofen, aleve  . Iodine-131 Anaphylaxis    Social History: Social History   Tobacco Use  . Smoking status: Former Smoker    Quit date: 01/20/1957    Years since quitting: 62.1  . Smokeless tobacco: Never Used  Substance Use Topics  . Alcohol use: No  .  Drug use: Never    Family History Family History  Problem Relation Age of Onset  . Heart attack Father   . Lung cancer Sister     Review of Systems 10 systems were reviewed and are negative except as noted specifically in the HPI.  Objective   Vital signs in last 24 hours: BP (!) 158/75 (BP Location: Right Arm)   Pulse 78   Temp 97.9 F (36.6 C) (Oral)   Resp 18   Ht 6\' 1"  (1.854 m)   Wt 121.1 kg   SpO2 98%   BMI 35.22 kg/m   Physical Exam General: NAD, A&O, resting, appropriate HEENT: [place]/AT, EOMI, MMM Pulmonary: Normal work of breathing Cardiovascular: HDS, adequate peripheral perfusion Abdomen: Soft, NTTP, nondistended, . GU: 24 French hematuria catheter, on CBI, clear in tubing Extremities: warm and well perfused Neuro: Appropriate, no focal neurological deficits  Most Recent Labs: Lab Results  Component Value Date   WBC 8.7 03/06/2019   HGB 8.3 (L) 03/06/2019   HCT 25.3 (L) 03/06/2019   PLT 218 03/06/2019    Lab Results  Component Value Date   NA 137 03/06/2019   K 3.6 03/06/2019   CL 104 03/06/2019   CO2 24 03/06/2019   BUN 14 03/06/2019   CREATININE 1.30 (H) 03/06/2019   CALCIUM 8.1 (L) 03/06/2019   MG 2.0 03/03/2019    Lab Results  Component Value Date   INR 1.11 01/26/2009   APTT (H) 01/26/2009    42        IF BASELINE aPTT IS ELEVATED, SUGGEST PATIENT RISK ASSESSMENT BE USED TO DETERMINE APPROPRIATE ANTICOAGULANT THERAPY.     IMAGING: No results found.  ------  Assessment:  84 y.o. male with CAD, A. fib (holding Eliquis), and recovered from Covid pneumonia in January 2020.  Unclear what his etiology of  hematuria is but it has been persistent over 2 weeks and a hematuria catheter was placed on 03/06/2019.  Hand irrigated to clear.  We will attempt to wean CBI as able in an effort to prevent bring him to the operating room.  If hematuria does not improve over the next several days he may need a clot evacuation and fulguration in the OR.   Recommendations: -Continue CBI, titrate to light pink in the TUBING -Hand irrigate as needed for clots and nondraining catheter -Hand irrigate every shift to prevent clot buildup -Urology will follow along and reassess in the morning   Thank you for this consult. Please contact the urology consult pager with any further questions/concerns.

## 2019-03-06 NOTE — Progress Notes (Signed)
In and out cath unsuccessful. Tubing still clotting and will not drain. Clots present when using a syringe to pull back. [place] notified. Will continue to monitor.

## 2019-03-06 NOTE — Progress Notes (Signed)
Most recent bladder scan 574ml. Pt still complaining of pressure and pain. Small amount of output after foley was d/c. Hematuria still noted. Paged the on call MD. Will continue to monitor.

## 2019-03-06 NOTE — Progress Notes (Signed)
Patient ID: Derek Foster, male   DOB: 02-05-1935, 84 y.o.   MRN: [id number]  PROGRESS NOTE    Derek Foster  [id number] DOB: January 05, 1936 DOA: 02/11/2019 PCP: Celene Squibb, MD   Brief Narrative:  84 year old male with history of CAD status post stent in 2011, hypertension, hyperlipidemia, GERD, hypothyroidism presented with abdominal pain, nausea, vomiting with generalized weakness since his recent Covid diagnosis on 22-Jan-2020 (patient's wife passed away from COVID-75 2 weeks ago). In the ED, he was noted to be in A. fib with RVR with rates in 140s to 160s and small bowel obstruction.  Cardiology and general surgery were consulted.  Treated with amiodarone drip/Cardizem drip/IV metoprolol and digoxin.  SBO was treated with NG tube and bowel rest.  Patient was started on anticoagulation for his A. fib, unfortunately he did develop significant hematuria, and outlet obstruction, required Foley insertion and holding anticoagulation.   Subjective:  Patient kept having hematuria overnight with blood clots, where three-way Foley catheter discontinued secondary to bladder spasms .  Assessment & Plan:   New onset paroxysmal atrial fibrillation/flutter with rapid ventricular response - hard to control rate, [place] vas 2 score of at least 3.   -Is on Eliquis, currently on hold due to hematuria , -Currently on  amiodarone, digoxin, beta-blocker and Cardizem for heart rate control . -I think this is primarily being driven by hyperthyroidism, methimazole dose has been increased further. RVR is improving.   Hyperthyroidism -TSH is still suppressed with increased T4, have increased methimazole dose.  Will require outpatient endocrine follow-up.  We will repeat TSH, free T4,  tomorrow to see if his methimazole dose need to be adjusted .   Lab Results  Component Value Date   TSH 0.012 (L) 02/28/2019    New onset hematuria outlet obstruction.  -Is most likely due to acute hemorrhagic cystitis and full  anticoagulation . -Continue to hold Eliquis . -Continues to have significant hematuria despite CBI, with multiple blood, urology input greatly appreciated, patient back on CBI, titrate to light pink in the tubing, hand irrigate as needed for clots and nondraining catheter. -Urology consult greatly appreciated. -UTI with urine culture growing Klebsiella and Proteus, continue with IV Rocephin, will treat for at 7-10  days -Continue with Flomax . -We will need to follow with urology as an outpatient .  Acute blood loss anemia -This appears to be secondary to persistent hematuria, even though hematuria is improving, still pink color urine, will transfuse for hemoglobin less than than 8.  Small bowel obstruction  - Recurrent ventral incisional hernia, likely due to underlying adhesions.  Required NG tube.  Now resolved.  Has been seen by general surgery.   Hypokalemia  -replaced and stable.  Acute kidney injury  - had improved but on 03/01/2019 slight bump, gently hydrate and monitor.  Thrombocytopenia  -resolved.  Leukocytosis -Most likely reactive.  Resolved  Recent COVID-19 infection  -Patient reports being diagnosed on Jan 22, 2023 at his PCP Dr. Josue Hector office completed 3-week isolation period on 1/21, repeat Covid PCR was positive on 1/22, suspect this is residual viral fragments, previous MD had called Dr. Josue Hector office and was able to confirm original positive Covid diagnosis on Jan 22, 2023.  Stable from the standpoint.   History of CAD   - Remote history of stents, -Stable  Hypertension  -Continue on Cardizem and beta-blocker combination.  GERD  -Continue oral PPI.  Generalized deconditioning    PT recommends SNF placement.  Social worker following.  Bereavement  disorder -Wife passed last month due to Covid, patient appears to be depressed, will consult chaplain     DVT prophylaxis: [place] heparin Code Status: DNR Family Communication: Discussed with daughter via phone  03/04/2019 Disposition Plan: SNF placement once hematuria has resolved .  He remains on continuous bladder irrigation currently Consultants: Cardiology/general surgery  Procedures: None  Antimicrobials:  None   Objective: Vitals:   03/06/19 0527 03/06/19 0546 03/06/19 1012 03/06/19 1259  BP: (!) 168/61 139/74 (!) 181/159 (!) 158/75  Pulse: (!) 105 97 72 78  Resp:   20 18  Temp:   97.8 F (36.6 C) 97.9 F (36.6 C)  TempSrc:   Oral Oral  SpO2:   98% 98%  Weight:      Height:        Intake/Output Summary (Last 24 hours) at 03/06/2019 1515 Last data filed at 03/06/2019 1400 Gross per 24 hour  Intake 7550 ml  Output 10675 ml  Net -3125 ml   Filed Weights   03/03/19 0437 03/05/19 2122 03/06/19 0500  Weight: 123.5 kg 120.3 kg 121.1 kg    Examination:  Awake Alert, Oriented X 3, No new F.N deficits, Normal affect Symmetrical Chest wall movement, Good air movement bilaterally, CTAB Irregular irregular,No Gallops,Rubs or new Murmurs, No Parasternal Heave +ve B.Sounds, Abd Soft, suprapubic fullness and tenderness, no rebound - guarding or rigidity. Extremity with trace edema bilaterally  Data Reviewed: I have personally reviewed following labs and imaging studies  CBC: Recent Labs  Lab 03/01/19 0457 03/01/19 0457 03/02/19 0438 03/03/19 0737 03/04/19 0311 03/05/19 0721 03/06/19 0310  WBC 8.6   < > 5.6 4.5 5.5 5.1 8.7  NEUTROABS 6.3  --  3.7 2.6 3.3  --   --   HGB 9.6*   < > 9.0* 9.0* 8.6* 8.1* 8.3*  HCT 28.8*   < > 27.2* 26.4* 25.8* 24.9* 25.3*  MCV 89.4   < > 89.8 87.7 89.6 89.9 90.0  PLT 232   < > 217 207 233 206 218   < > = values in this interval not displayed.   Basic Metabolic Panel: Recent Labs  Lab 03/01/19 0457 03/01/19 0457 03/02/19 0438 03/03/19 0737 03/04/19 0311 03/05/19 0721 03/06/19 0310  NA 134*   < > 131* 136 138 136 137  K 3.9   < > 3.8 3.8 3.9 3.9 3.6  CL 101   < > 100 102 102 103 104  CO2 23   < > 23 25 26 25 24   GLUCOSE 114*   < >  116* 117* 113* 109* 115*  BUN 20   < > 22 19 19 14 14   CREATININE 1.60*   < > 1.41* 1.15 1.38* 1.24 1.30*  CALCIUM 8.0*   < > 7.9* 8.0* 8.2* 8.2* 8.1*  MG 1.9  --  1.9 2.0  --   --   --    < > = values in this interval not displayed.   GFR: Estimated Creatinine Clearance: 59.7 mL/min (A) (by C-G formula based on SCr of 1.3 mg/dL (H)). Liver Function Tests: Recent Labs  Lab 03/01/19 0457 03/02/19 0438 03/03/19 0737 03/04/19 0311  AST 24 24 19 15   ALT 36 32 27 23  ALKPHOS 123 97 82 86  BILITOT 0.6 0.4 0.3 0.5  PROT 5.3* 5.2* 5.1* 5.0*  ALBUMIN 2.0* 1.8* 2.0* 2.0*   No results for input(s): LIPASE, AMYLASE in the last 168 hours. No results for input(s): AMMONIA in the last 168 hours.  Coagulation Profile: No results for input(s): INR, PROTIME in the last 168 hours. Cardiac Enzymes: No results for input(s): CKTOTAL, CKMB, CKMBINDEX, TROPONINI in the last 168 hours. BNP (last 3 results) No results for input(s): PROBNP in the last 8760 hours. HbA1C: No results for input(s): HGBA1C in the last 72 hours. CBG: No results for input(s): GLUCAP in the last 168 hours. Lipid Profile: No results for input(s): CHOL, HDL, LDLCALC, TRIG, CHOLHDL, LDLDIRECT in the last 72 hours. Thyroid Function Tests: No results for input(s): TSH, T4TOTAL, FREET4, T3FREE, THYROIDAB in the last 72 hours. Anemia Panel: No results for input(s): VITAMINB12, FOLATE, FERRITIN, TIBC, IRON, RETICCTPCT in the last 72 hours. Sepsis Labs: No results for input(s): PROCALCITON, LATICACIDVEN in the last 168 hours.  Recent Results (from the past 240 hour(s))  Culture, Urine     Status: Abnormal   Collection Time: 02/27/19  9:36 AM   Specimen: Urine, Random  Result Value Ref Range Status   Specimen Description URINE, RANDOM  Final   Special Requests   Final    NONE Performed at [facility], [address] [place], [place] [address]    Culture (A)  Final    >=100,000 COLONIES/mL PROTEUS MIRABILIS 90,000  COLONIES/mL KLEBSIELLA OXYTOCA    Report Status 03/02/2019 FINAL  Final   Organism ID, Bacteria KLEBSIELLA OXYTOCA (A)  Final   Organism ID, Bacteria PROTEUS MIRABILIS (A)  Final      Susceptibility   Klebsiella oxytoca - MIC*    AMPICILLIN >=32 RESISTANT Resistant     CEFAZOLIN >=64 RESISTANT Resistant     CEFTRIAXONE <=0.25 SENSITIVE Sensitive     CIPROFLOXACIN <=0.25 SENSITIVE Sensitive     GENTAMICIN <=1 SENSITIVE Sensitive     IMIPENEM <=0.25 SENSITIVE Sensitive     NITROFURANTOIN <=16 SENSITIVE Sensitive     TRIMETH/SULFA <=20 SENSITIVE Sensitive     AMPICILLIN/SULBACTAM 16 INTERMEDIATE Intermediate     PIP/TAZO <=4 SENSITIVE Sensitive     * 90,000 COLONIES/mL KLEBSIELLA OXYTOCA   Proteus mirabilis - MIC*    AMPICILLIN <=2 SENSITIVE Sensitive     CEFAZOLIN <=4 SENSITIVE Sensitive     CEFTRIAXONE <=0.25 SENSITIVE Sensitive     CIPROFLOXACIN 1 SENSITIVE Sensitive     GENTAMICIN <=1 SENSITIVE Sensitive     IMIPENEM 4 SENSITIVE Sensitive     NITROFURANTOIN 256 RESISTANT Resistant     TRIMETH/SULFA <=20 SENSITIVE Sensitive     AMPICILLIN/SULBACTAM <=2 SENSITIVE Sensitive     PIP/TAZO <=4 SENSITIVE Sensitive     * >=100,000 COLONIES/mL PROTEUS MIRABILIS       Radiology Studies: No results found.   Scheduled Meds: . amiodarone  200 mg Oral BID  . Chlorhexidine Gluconate Cloth  6 each Topical Daily  . digoxin  0.125 mg Oral Daily  . diltiazem  360 mg Oral Daily  . docusate sodium  100 mg Oral BID  . feeding supplement  1 Container Oral BID BM  . feeding supplement (ENSURE ENLIVE)  237 mL Oral Q1500  . guaiFENesin  15 mL Per Tube Q6H  . heparin injection (subcutaneous)  5,000 Units Subcutaneous Q8H  . methimazole  20 mg Oral TID  . metoprolol tartrate  200 mg Oral BID  . multivitamin with minerals  1 tablet Oral Daily  . pantoprazole  40 mg Oral BID  . pravastatin  10 mg Oral q1800  . sodium chloride flush  10-40 mL Intracatheter Q12H  . tamsulosin  0.4 mg Oral  Daily   Continuous  Infusions: . cefTRIAXone (ROCEPHIN)  IV 1 g (03/06/19 1019)  . sodium chloride irrigation      Phillips Climes, MD [facility] 03/06/2019, 3:15 PM

## 2019-03-07 DIAGNOSIS — E0590 Thyrotoxicosis, unspecified without thyrotoxic crisis or storm: Secondary | ICD-10-CM | POA: Diagnosis not present

## 2019-03-07 DIAGNOSIS — D649 Anemia, unspecified: Secondary | ICD-10-CM | POA: Diagnosis not present

## 2019-03-07 DIAGNOSIS — R7301 Impaired fasting glucose: Secondary | ICD-10-CM | POA: Diagnosis not present

## 2019-03-07 DIAGNOSIS — N390 Urinary tract infection, site not specified: Secondary | ICD-10-CM

## 2019-03-07 DIAGNOSIS — R972 Elevated prostate specific antigen [PSA]: Secondary | ICD-10-CM | POA: Diagnosis not present

## 2019-03-07 DIAGNOSIS — I10 Essential (primary) hypertension: Secondary | ICD-10-CM | POA: Diagnosis not present

## 2019-03-07 DIAGNOSIS — E782 Mixed hyperlipidemia: Secondary | ICD-10-CM | POA: Diagnosis not present

## 2019-03-07 LAB — CBC
HCT: 24.5 % — ABNORMAL LOW (ref 39.0–52.0)
Hemoglobin: 7.9 g/dL — ABNORMAL LOW (ref 13.0–17.0)
MCH: 29.7 pg (ref 26.0–34.0)
MCHC: 32.2 g/dL (ref 30.0–36.0)
MCV: 92.1 fL (ref 80.0–100.0)
Platelets: 235 10*3/uL (ref 150–400)
RBC: 2.66 MIL/uL — ABNORMAL LOW (ref 4.22–5.81)
RDW: 14.6 % (ref 11.5–15.5)
WBC: 7.7 10*3/uL (ref 4.0–10.5)
nRBC: 0 % (ref 0.0–0.2)

## 2019-03-07 LAB — BASIC METABOLIC PANEL
Anion gap: 8 (ref 5–15)
BUN: 13 mg/dL (ref 8–23)
CO2: 24 mmol/L (ref 22–32)
Calcium: 7.9 mg/dL — ABNORMAL LOW (ref 8.9–10.3)
Chloride: 103 mmol/L (ref 98–111)
Creatinine, Ser: 1.29 mg/dL — ABNORMAL HIGH (ref 0.61–1.24)
GFR calc Af Amer: 59 mL/min — ABNORMAL LOW (ref >60)
GFR calc non Af Amer: 51 mL/min — ABNORMAL LOW (ref >60)
Glucose, Bld: 109 mg/dL — ABNORMAL HIGH (ref 70–99)
Potassium: 3.9 mmol/L (ref 3.5–5.1)
Sodium: 135 mmol/L (ref 135–145)

## 2019-03-07 LAB — TSH: TSH: 0.013 u[IU]/mL — ABNORMAL LOW (ref 0.350–4.500)

## 2019-03-07 LAB — T4, FREE: Free T4: 1.74 ng/dL — ABNORMAL HIGH (ref 0.61–1.12)

## 2019-03-07 NOTE — Progress Notes (Signed)
  Continuous Bladder Irrigation:  Total Output from 1500 - 0300 = 3700 ml - CBI intake                               = 3000 ml __________________________________ Urinary output                           =   700 mL

## 2019-03-07 NOTE — Progress Notes (Signed)
Physical Therapy Treatment Patient Details Name: Derek Foster MRN: [id number] DOB: [birth year] Today's Date: 03/07/2019    History of Present Illness 84 year old male with history of CAD status post stent in 2011, hypertension, hyperlipidemia, GERD, hypothyroidism presented with abdominal pain, nausea, vomiting with generalized weakness since his recent Covid diagnosis on 2020/02/08 (patient's wife passed away from COVID-19 2 weeks ago).In the ED, he was noted to be in A. fib with RVR with rates in 140s to 160s and small bowel obstruction.  Cardiology and general surgery were consulted.    PT Comments    Pt received sitting up in recliner. Pt agreeable to PT. Pt requesting to return to bed to rest after being in recliner for a while. Pt performed seated and supine LE/UE therex for strengthening with and without orange theraband. Pt required min A to rise from recliner and min A to ambulate from recliner to bed. Pt requiring assist for RW mgt and to steady. Pt reporting increased fatigue and not wanting to ambulate further. Pt required min A to return to supine and able to scoot up in bed mod I with cuing for bed rail use. Pt is progressing slowly towards PT goals. Pt will continue to benefit from skilled acute PT to continue progressing functional mobility and improving noted deficits. Recommendation remains appropriate.    Follow Up Recommendations  SNF;Supervision/Assistance - 24 hour     Equipment Recommendations  None recommended by PT    Recommendations for Other Services       Precautions / Restrictions Precautions Precautions: Fall Restrictions Weight Bearing Restrictions: No    Mobility  Bed Mobility Overal bed mobility: Needs Assistance Bed Mobility: Sit to Supine       Sit to supine: Min assist;HOB elevated   General bed mobility comments: min A for LE advancement and repostioning in bed, cuing for use of bed rails to assist with scooting  Transfers Overall transfer  level: Needs assistance Equipment used: Rolling walker (2 wheeled) Transfers: Sit to/from Stand Sit to Stand: Min assist         General transfer comment: min A to rise from recliner, utilizing rocking for momentum, cuing for hand placement and use of RW, increased time and effort to rise to full standing  Ambulation/Gait Ambulation/Gait assistance: Min assist Gait Distance (Feet): 8 Feet Assistive device: Rolling walker (2 wheeled) Gait Pattern/deviations: Step-to pattern;Decreased stride length;Shuffle;Trunk flexed Gait velocity: dec   General Gait Details: pt ambulated to bed from recliner, min A for balance and RW mgt, increased difficulty today with ambulation, pt reporting increased fatigue from sitting up in chair mutliple hours, extremely dec stride length and ground clearance   Stairs             Wheelchair Mobility    Modified Rankin (Stroke Patients Only)       Balance Overall balance assessment: Needs assistance Sitting-balance support: Feet supported;No upper extremity supported Sitting balance-Leahy Scale: Good Sitting balance - Comments: steady EOB   Standing balance support: Bilateral upper extremity supported Standing balance-Leahy Scale: Poor Standing balance comment: heavy reliance on UE support from RW                            Cognition Arousal/Alertness: Awake/alert Behavior During Therapy: [facility] for tasks assessed/performed Overall Cognitive Status: Within Functional Limits for tasks assessed  Exercises Total Joint Exercises Towel Squeeze: AROM;Both;10 reps;Seated Hip ABduction/ADduction: AROM;Both;10 reps Straight Leg Raises: AROM;Both;10 reps Long Arc Quad: AROM;Both;10 reps;Seated(orange band for resistance) Marching in Standing: AROM;Both;10 reps;Seated General Exercises - Upper Extremity Shoulder Horizontal ABduction: AROM;Both;10 reps;Theraband(orange  theraband) Elbow Extension: AROM;Both;10 reps;Theraband(orange theraband) Other Exercises Other Exercises: seated hip abd against orange theraband x10 reps    General Comments        Pertinent Vitals/Pain Faces Pain Scale: Hurts a little bit Pain Location: generalized Pain Descriptors / Indicators: Discomfort Pain Intervention(s): Limited activity within patient's tolerance;Monitored during session    Home Living                      Prior Function            PT Goals (current goals can now be found in the care plan section) Progress towards PT goals: Progressing toward goals    Frequency    Min 3X/week      PT Plan Current plan remains appropriate    Co-evaluation              AM-PAC PT "6 Clicks" Mobility   Outcome Measure  Help needed turning from your back to your side while in a flat bed without using bedrails?: None Help needed moving from lying on your back to sitting on the side of a flat bed without using bedrails?: A Little Help needed moving to and from a bed to a chair (including a wheelchair)?: A Little Help needed standing up from a chair using your arms (e.g., wheelchair or bedside chair)?: A Little Help needed to walk in hospital room?: A Little Help needed climbing 3-5 steps with a railing? : A Lot 6 Click Score: 18    End of Session Equipment Utilized During Treatment: Gait belt Activity Tolerance: Patient limited by fatigue Patient left: in bed;with call bell/phone within reach;with bed alarm set Nurse Communication: Mobility status PT Visit Diagnosis: Other abnormalities of gait and mobility (R26.89);Difficulty in walking, not elsewhere classified (R26.2);Muscle weakness (generalized) (M62.81)     Time: 1452-1510 PT Time Calculation (min) (ACUTE ONLY): 18 min  Charges:  $Therapeutic Exercise: 8-22 mins                     Zachary George PT, DPT 4:16 PM,03/07/19    Tarvares Lant Drucilla Chalet 03/07/2019, 4:14 PM

## 2019-03-07 NOTE — Progress Notes (Signed)
Patient ID: Derek Foster, male   DOB: Jan 05, 1936, 84 y.o.   MRN: [id number]  PROGRESS NOTE    Derek Foster  [id number] DOB: 09/16/1935 DOA: 02/11/2019 PCP: Derek Squibb, MD   Brief Narrative:  84 year old male with history of CAD status post stent in 2011, hypertension, hyperlipidemia, GERD, hypothyroidism presented with abdominal pain, nausea, vomiting with generalized weakness since his recent Covid diagnosis on 02-17-2020 (patient's wife passed away from COVID-81 2 weeks prior to presentation). In the ED, he was noted to be in A. fib with RVR with rates in 140s to 160s and small bowel obstruction.  Cardiology and general surgery were consulted.  Treated with amiodarone drip/Cardizem drip/IV metoprolol and digoxin.  SBO was treated with NG tube and bowel rest which subsequently resolved. He was started on anticoagulation for his A. fib.  Unfortunately, he did develop significant hematuria  requiring Foley catheter insertion and holding anticoagulation.  Urology was consulted.  Assessment & Plan:   New onset hematuria Probable UTI -In the setting of anticoagulation.  Questionable cause.  Eliquis on hold -Urology following.  Currently undergoing CBI. -Urine culture grew Klebsiella and Proteus.  Continue Rocephin: Treat for 7 to 10 days total antibiotic course -Continue Flomax -Hematuria improving.  Hemoglobin 7.9 today.  New onset paroxysmal atrial fibrillation/flutter with rapid ventricular response -Cardiology has signed off. -Currently on oral amiodarone, Cardizem, metoprolol and digoxin.  Rate controlled -Eliquis on hold -2D echo showed preserved EF  Hyperthyroidism -TSH/T4 consistent with hyperthyroidism.  Methimazole dose has been titrated to 20 mg daily.  Outpatient follow-up with PCP/endocrinology  Small bowel obstruction Recurrent ventral incisional hernia -Likely secondary to adhesions from prior surgeries and ventral hernia -Treated conservatively with NG tube placement,  bowel rest and subsequent NG tube removal.  Currently having bowel movements.  General surgery has signed off.  Currently tolerating diet.    Hypokalemia  -Improved.  Acute kidney injury -Likely secondary to GI losses.  Improved.  Stable.  Thrombocytopenia -Questionable cause.  Resolved.  No signs of bleeding  Leukocytosis -Most likely reactive.  Resolved  Recent COVID-19 infection -Patient reports being diagnosed on 17-Feb-2023 at his PCP Derek Foster office completed 3-week isolation period on 1/21, repeat Covid PCR was positive on 1/22, suspect this is residual viral fragments, I called Derek Foster office and was able to confirm original positive Covid diagnosis on 2023-02-17 -Did have generalized weakness secondary to this without respiratory symptoms -Off isolation  History of CAD -Remote history of stents -Stable  Hypertension -Continue Cardizem and metoprolol.  BP stable  GERD -Continue oral PPI.  Generalized deconditioning -Prognosis is guarded.  PT recommends SNF placement.  Social worker following. -We will request palliative care evaluation for goals of care discussion  DVT prophylaxis: Eliquis on hold Code Status: DNR Family Communication: Spoke to patient at bedside.   Disposition Plan: We will remain inpatient to continue CBI.  Once hematuria resolves and urology clears the patient for discharge, he will need SNF placement.  Social worker is following.   Consultants: Cardiology/general surgery/urology  Procedures: None  Antimicrobials:  Anti-infectives (From admission, onward)   Start     Dose/Rate Route Frequency Ordered Stop   03/04/19 1000  cefTRIAXone (ROCEPHIN) 1 g in sodium chloride 0.9 % 100 mL IVPB     1 g 200 mL/hr over 30 Minutes Intravenous Every 24 hours 03/04/19 0806 03/09/19 0959   02/27/19 1115  cefTRIAXone (ROCEPHIN) 1 g in sodium chloride 0.9 % 100 mL IVPB  1 g 200 mL/hr over 30 Minutes Intravenous Every 24 hours 02/27/19 1111 03/03/19  1309       Subjective: Patient seen and examined at bedside.  Poor historian.  Denies abdominal pain, worsening shortness of breath, fever, vomiting.  Feels slightly better.  Still feels very weak.  Objective: Vitals:   03/06/19 1259 03/06/19 2131 03/07/19 0426 03/07/19 0608  BP: (!) 158/75 (!) 144/59 128/62   Pulse: 78 61 78   Resp: 18 18 18    Temp: 97.9 F (36.6 C) 98.7 F (37.1 C) 98.6 F (37 C)   TempSrc: Oral Oral Oral   SpO2: 98% 97% 98%   Weight:    127.8 kg  Height:        Intake/Output Summary (Last 24 hours) at 03/07/2019 1055 Last data filed at 03/07/2019 0900 Gross per 24 hour  Intake 7910 ml  Output 3050 ml  Net 4860 ml   Filed Weights   03/05/19 2122 03/06/19 0500 03/07/19 0608  Weight: 120.3 kg 121.1 kg 127.8 kg    Examination:  General exam: No distress.  Poor historian.  Looks chronically ill. ENT: No elevated JVD Respiratory system: Bilateral decreased breath sounds at bases with scattered crackles.   No wheezing  cardiovascular system: S1-S2 heard, rate controlled gastrointestinal system: Abdomen is nondistended, soft and nontender.  Normal bowel sounds present.  Left-sided ventral hernia present  extremities: No cyanosis or clubbing; bilateral trace lower extremity edema present Central nervous system: Alert and awake.  Moving extremities.  Poor historian skin: No ulcers or lesions or rashes Psychiatry: Flat affect Genitourinary: Has Foley catheter with pinkish urine in the bag  Data Reviewed: I have personally reviewed following labs and imaging studies  CBC: Recent Labs  Lab 03/01/19 0457 03/01/19 0457 03/02/19 0438 03/02/19 0438 03/03/19 0737 03/04/19 0311 03/05/19 0721 03/06/19 0310 03/07/19 0247  WBC 8.6   < > 5.6   < > 4.5 5.5 5.1 8.7 7.7  NEUTROABS 6.3  --  3.7  --  2.6 3.3  --   --   --   HGB 9.6*   < > 9.0*   < > 9.0* 8.6* 8.1* 8.3* 7.9*  HCT 28.8*   < > 27.2*   < > 26.4* 25.8* 24.9* 25.3* 24.5*  MCV 89.4   < > 89.8   < >  87.7 89.6 89.9 90.0 92.1  PLT 232   < > 217   < > 207 233 206 218 235   < > = values in this interval not displayed.   Basic Metabolic Panel: Recent Labs  Lab 03/01/19 0457 03/01/19 0457 03/02/19 0438 03/02/19 0438 03/03/19 0737 03/04/19 0311 03/05/19 0721 03/06/19 0310 03/07/19 0247  NA 134*   < > 131*   < > 136 138 136 137 135  K 3.9   < > 3.8   < > 3.8 3.9 3.9 3.6 3.9  CL 101   < > 100   < > 102 102 103 104 103  CO2 23   < > 23   < > 25 26 25 24 24   GLUCOSE 114*   < > 116*   < > 117* 113* 109* 115* 109*  BUN 20   < > 22   < > 19 19 14 14 13   CREATININE 1.60*   < > 1.41*   < > 1.15 1.38* 1.24 1.30* 1.29*  CALCIUM 8.0*   < > 7.9*   < > 8.0* 8.2* 8.2* 8.1* 7.9*  MG  1.9  --  1.9  --  2.0  --   --   --   --    < > = values in this interval not displayed.   GFR: Estimated Creatinine Clearance: 61.9 mL/min (A) (by C-G formula based on SCr of 1.29 mg/dL (H)). Liver Function Tests: Recent Labs  Lab 03/01/19 0457 03/02/19 0438 03/03/19 0737 03/04/19 0311  AST 24 24 19 15   ALT 36 32 27 23  ALKPHOS 123 97 82 86  BILITOT 0.6 0.4 0.3 0.5  PROT 5.3* 5.2* 5.1* 5.0*  ALBUMIN 2.0* 1.8* 2.0* 2.0*   No results for input(s): LIPASE, AMYLASE in the last 168 hours. No results for input(s): AMMONIA in the last 168 hours. Coagulation Profile: No results for input(s): INR, PROTIME in the last 168 hours. Cardiac Enzymes: No results for input(s): CKTOTAL, CKMB, CKMBINDEX, TROPONINI in the last 168 hours. BNP (last 3 results) No results for input(s): PROBNP in the last 8760 hours. HbA1C: No results for input(s): HGBA1C in the last 72 hours. CBG: No results for input(s): GLUCAP in the last 168 hours. Lipid Profile: No results for input(s): CHOL, HDL, LDLCALC, TRIG, CHOLHDL, LDLDIRECT in the last 72 hours. Thyroid Function Tests: Recent Labs    03/07/19 0247  TSH 0.013*  FREET4 1.74*   Anemia Panel: No results for input(s): VITAMINB12, FOLATE, FERRITIN, TIBC, IRON, RETICCTPCT in  the last 72 hours. Sepsis Labs: No results for input(s): PROCALCITON, LATICACIDVEN in the last 168 hours.  Recent Results (from the past 240 hour(s))  Culture, Urine     Status: Abnormal   Collection Time: 02/27/19  9:36 AM   Specimen: Urine, Random  Result Value Ref Range Status   Specimen Description URINE, RANDOM  Final   Special Requests   Final    NONE Performed at [facility], [address], [place], [place] [address]    Culture (A)  Final    >=100,000 COLONIES/mL PROTEUS MIRABILIS 90,000 COLONIES/mL KLEBSIELLA OXYTOCA    Report Status 03/02/2019 FINAL  Final   Organism ID, Bacteria KLEBSIELLA OXYTOCA (A)  Final   Organism ID, Bacteria PROTEUS MIRABILIS (A)  Final      Susceptibility   Klebsiella oxytoca - MIC*    AMPICILLIN >=32 RESISTANT Resistant     CEFAZOLIN >=64 RESISTANT Resistant     CEFTRIAXONE <=0.25 SENSITIVE Sensitive     CIPROFLOXACIN <=0.25 SENSITIVE Sensitive     GENTAMICIN <=1 SENSITIVE Sensitive     IMIPENEM <=0.25 SENSITIVE Sensitive     NITROFURANTOIN <=16 SENSITIVE Sensitive     TRIMETH/SULFA <=20 SENSITIVE Sensitive     AMPICILLIN/SULBACTAM 16 INTERMEDIATE Intermediate     PIP/TAZO <=4 SENSITIVE Sensitive     * 90,000 COLONIES/mL KLEBSIELLA OXYTOCA   Proteus mirabilis - MIC*    AMPICILLIN <=2 SENSITIVE Sensitive     CEFAZOLIN <=4 SENSITIVE Sensitive     CEFTRIAXONE <=0.25 SENSITIVE Sensitive     CIPROFLOXACIN 1 SENSITIVE Sensitive     GENTAMICIN <=1 SENSITIVE Sensitive     IMIPENEM 4 SENSITIVE Sensitive     NITROFURANTOIN 256 RESISTANT Resistant     TRIMETH/SULFA <=20 SENSITIVE Sensitive     AMPICILLIN/SULBACTAM <=2 SENSITIVE Sensitive     PIP/TAZO <=4 SENSITIVE Sensitive     * >=100,000 COLONIES/mL PROTEUS MIRABILIS         Radiology Studies: No results found.      Scheduled Meds: . amiodarone  200 mg Oral BID  . Chlorhexidine Gluconate Cloth  6 each Topical  Daily  . digoxin  0.125 mg Oral Daily  . diltiazem  360 mg  Oral Daily  . docusate sodium  100 mg Oral BID  . feeding supplement  1 Container Oral BID BM  . feeding supplement (ENSURE ENLIVE)  237 mL Oral Q1500  . guaiFENesin  15 mL Per Tube Q6H  . heparin injection (subcutaneous)  5,000 Units Subcutaneous Q8H  . methimazole  20 mg Oral TID  . metoprolol tartrate  200 mg Oral BID  . multivitamin with minerals  1 tablet Oral Daily  . pantoprazole  40 mg Oral BID  . pravastatin  10 mg Oral q1800  . sodium chloride flush  10-40 mL Intracatheter Q12H  . tamsulosin  0.4 mg Oral Daily   Continuous Infusions: . cefTRIAXone (ROCEPHIN)  IV 1 g (03/06/19 1019)  . sodium chloride irrigation            Derek August, MD [facility] 03/07/2019, 10:55 AM

## 2019-03-07 NOTE — TOC Progression Note (Addendum)
Transition of Care [facility]) - Progression Note    Patient Details  Name: Derek Foster MRN: [id number] Date of Birth: 1935-05-04  Transition of Care [facility]) CM/SW [place], [place] Phone Number: 03/07/2019, 10:16 AM  Clinical Narrative:    10:59am- CSW restarted authorization for placement at [facility], per urology pt should be stable from their point of view within 24-48 hrs.   10:16am- Spoke with Percell Locus, CSW from [facility] who has been following pt case to this point. Pt COVID+ but off isolation, medical needs have prevented discharge up until this point. Plan is for SNF placement at [facility] when medically appropriate. Pt will need new auth for discharge when 24-48 hrs away from discharge.    Expected Discharge Plan: [place] Barriers to Discharge: Continued Medical Work up  Expected Discharge Plan and Services Expected Discharge Plan: [place] In-house Referral: Clinical Social Work Living arrangements for the past 2 months: Single Family Home Expected Discharge Date: 02/26/19                  Readmission Risk Interventions No flowsheet data found.

## 2019-03-07 NOTE — Progress Notes (Signed)
Flashed Catheter once tonight.

## 2019-03-07 NOTE — Progress Notes (Signed)
Urology Progress Note    84 year old male with hematuria currently on CBI.  Subjective: Continued on moderate CBI overnight.  Nursing flushed every shift with minimal clots.  CBI has not clotted off.  I flushed again this morning with 200 cc of sterile water, again with no clots removed.  Patient is tolerating CBI well.  Objective: Vital signs in last 24 hours: Temp:  [97.8 F (36.6 C)-98.7 F (37.1 C)] 98.6 F (37 C) (02/15 0426) Pulse Rate:  [61-78] 78 (02/15 0426) Resp:  [18-20] 18 (02/15 0426) BP: (128-181)/(59-159) 128/62 (02/15 0426) SpO2:  [97 %-98 %] 98 % (02/15 0426) Weight:  [127.8 kg] 127.8 kg (02/15 0608)  Intake/Output from previous day: 02/14 0701 - 02/15 0700 In: 7840 [P.O.:790; IV Piggyback:100] Out: 1950 [Urine:1950] Intake/Output this shift: No intake/output data recorded.  Physical Exam:  General: Alert and oriented CV: RRR Lungs: Normal work of breathing Abdomen: Soft GU: 24 French hematuria cath, urine light red with sediment in the tubing.  Slow/moderate drip CBI Ext: NT, No erythema  Lab Results: Recent Labs    03/05/19 0721 03/06/19 0310 03/07/19 0247  HGB 8.1* 8.3* 7.9*  HCT 24.9* 25.3* 24.5*   BMET Recent Labs    03/06/19 0310 03/07/19 0247  NA 137 135  K 3.6 3.9  CL 104 103  CO2 24 24  GLUCOSE 115* 109*  BUN 14 13  CREATININE 1.30* 1.29*  CALCIUM 8.1* 7.9*     Studies/Results: No results found.  Assessment/Plan:  84 y.o. male with hematuria on CBI.  Imaging shows no clear GU pathology.  Urine culture previously negative.  We will continue to try and manage him conservatively with CBI again irrigation.  - Wean CBI as able today, light pink in the tubing -Flush every shift to ensure no obstructing clots -Urology will continue to follow   Dispo: Floor   LOS: 24 days   Tharon Aquas 03/07/2019, 7:12 AM

## 2019-03-08 LAB — CBC WITH DIFFERENTIAL/PLATELET
Abs Immature Granulocytes: 0.48 10*3/uL — ABNORMAL HIGH (ref 0.00–0.07)
Basophils Absolute: 0 10*3/uL (ref 0.0–0.1)
Basophils Relative: 1 %
Eosinophils Absolute: 0.3 10*3/uL (ref 0.0–0.5)
Eosinophils Relative: 5 %
HCT: 25.1 % — ABNORMAL LOW (ref 39.0–52.0)
Hemoglobin: 8.3 g/dL — ABNORMAL LOW (ref 13.0–17.0)
Immature Granulocytes: 7 %
Lymphocytes Relative: 22 %
Lymphs Abs: 1.5 10*3/uL (ref 0.7–4.0)
MCH: 30.4 pg (ref 26.0–34.0)
MCHC: 33.1 g/dL (ref 30.0–36.0)
MCV: 91.9 fL (ref 80.0–100.0)
Monocytes Absolute: 0.6 10*3/uL (ref 0.1–1.0)
Monocytes Relative: 9 %
Neutro Abs: 3.8 10*3/uL (ref 1.7–7.7)
Neutrophils Relative %: 56 %
Platelets: 227 10*3/uL (ref 150–400)
RBC: 2.73 MIL/uL — ABNORMAL LOW (ref 4.22–5.81)
RDW: 14.6 % (ref 11.5–15.5)
WBC: 6.8 10*3/uL (ref 4.0–10.5)
nRBC: 0 % (ref 0.0–0.2)

## 2019-03-08 LAB — BASIC METABOLIC PANEL
Anion gap: 10 (ref 5–15)
BUN: 11 mg/dL (ref 8–23)
CO2: 25 mmol/L (ref 22–32)
Calcium: 8.2 mg/dL — ABNORMAL LOW (ref 8.9–10.3)
Chloride: 101 mmol/L (ref 98–111)
Creatinine, Ser: 1.25 mg/dL — ABNORMAL HIGH (ref 0.61–1.24)
GFR calc Af Amer: >60 mL/min (ref >60)
GFR calc non Af Amer: 53 mL/min — ABNORMAL LOW (ref >60)
Glucose, Bld: 116 mg/dL — ABNORMAL HIGH (ref 70–99)
Potassium: 4.1 mmol/L (ref 3.5–5.1)
Sodium: 136 mmol/L (ref 135–145)

## 2019-03-08 LAB — MAGNESIUM: Magnesium: 1.8 mg/dL (ref 1.7–2.4)

## 2019-03-08 MED ORDER — SENNOSIDES-DOCUSATE SODIUM 8.6-50 MG PO TABS
1.0000 | ORAL_TABLET | Freq: Two times a day (BID) | ORAL | Status: DC
  Administered 2019-03-08 – 2019-03-11 (×8): 1 via ORAL
  Filled 2019-03-08 (×9): qty 1

## 2019-03-08 MED ORDER — BISACODYL 10 MG RE SUPP: 10.0000 mg | Freq: Every day | RECTAL | Status: DC | PRN

## 2019-03-08 NOTE — Social Work (Addendum)
As pt continues to need urology f/u, CSW spoke with [employer] liaison and withdrew authorization. Following for stability prior to re-requesting authorization at this time.   Westley Hummer, MSW, [place] Work

## 2019-03-08 NOTE — Progress Notes (Addendum)
Patient ID: Derek Foster, male   DOB: [birth year], 84 y.o.   MRN: [id number]  PROGRESS NOTE    Derek Foster  [id number] DOB: Sep 24, 1935 DOA: 02/11/2019 PCP: Derek Squibb, MD   Brief Narrative:  84 year old male with history of CAD status post stent in 2011, hypertension, hyperlipidemia, GERD, hypothyroidism presented with abdominal pain, nausea, vomiting with generalized weakness since his recent Covid diagnosis on 01/31/2020 (patient's wife passed away from COVID-69 2 weeks prior to presentation). In the ED, he was noted to be in A. fib with RVR with rates in 140s to 160s and small bowel obstruction.  Cardiology and general surgery were consulted.  Treated with amiodarone drip/Cardizem drip/IV metoprolol and digoxin.  SBO was treated with NG tube and bowel rest which subsequently resolved. He was started on anticoagulation for his A. fib.  Unfortunately, he did develop significant hematuria  requiring Foley catheter insertion and holding anticoagulation.  Urology was consulted.  Assessment & Plan:   New onset hematuria Probable UTI -In the setting of anticoagulation.  Questionable cause.   -Urology following.  CBI is being stopped by urology today.  Follow further recommendations.  Keep Eliquis on hold till urology is okay to resume. -Urine culture grew Klebsiella and Proteus.  DC Rocephin after today's dose: Today is day #10. -Continue Flomax -Hematuria improving.  Hemoglobin 8.3 today.  Monitor H&H.    New onset paroxysmal atrial fibrillation/flutter with rapid ventricular response -Cardiology has signed off. -Currently on oral amiodarone, Cardizem, metoprolol and digoxin.  Rate controlled -Eliquis on hold -2D echo showed preserved EF  Hyperthyroidism -TSH/T4 consistent with hyperthyroidism.  Methimazole dose has been titrated to 20 mg daily.  Outpatient follow-up with PCP/endocrinology  Small bowel obstruction Recurrent ventral incisional hernia -Likely secondary to adhesions  from prior surgeries and ventral hernia -Treated conservatively with NG tube placement, bowel rest and subsequent NG tube removal.  Currently having bowel movements.  General surgery has signed off.  Currently tolerating diet.    Hypokalemia  -Improved.  Acute kidney injury -Likely secondary to GI losses.  Improved.  Stable.  Thrombocytopenia -Questionable cause.  Resolved.  No signs of bleeding  Leukocytosis -Most likely reactive.  Resolved  Recent COVID-19 infection -Patient reports being diagnosed on 01/31/23 at his PCP Derek Foster office completed 3-week isolation period on 1/21, repeat Covid PCR was positive on 1/22, suspect this is residual viral fragments, I called Derek Foster office and was able to confirm original positive Covid diagnosis on 2023-01-31 -Did have generalized weakness secondary to this without respiratory symptoms -Off isolation  History of CAD -Remote history of stents -Stable  Hypertension -Continue Cardizem and metoprolol.  BP stable  GERD -Continue oral PPI.  Generalized deconditioning -Prognosis is guarded.  PT recommends SNF placement.  Social worker following. -Palliative care evaluation has been requested for goals of care discussion.  DVT prophylaxis: Eliquis on hold Code Status: DNR Family Communication: Spoke to patient at bedside.   Disposition Plan: Continue to remain inpatient till CBI has been completely stopped.  Will subsequently have to resume Eliquis after urology gives clearance.  Hopefully discharge to SNF in the next 1 to 3 days.  Consultants: Cardiology/general surgery/urology  Procedures: None  Antimicrobials:  Anti-infectives (From admission, onward)   Start     Dose/Rate Route Frequency Ordered Stop   03/04/19 1000  cefTRIAXone (ROCEPHIN) 1 g in sodium chloride 0.9 % 100 mL IVPB     1 g 200 mL/hr over 30 Minutes Intravenous Every 24  hours 03/04/19 0806 03/09/19 0959   02/27/19 1115  cefTRIAXone (ROCEPHIN) 1 g in  sodium chloride 0.9 % 100 mL IVPB     1 g 200 mL/hr over 30 Minutes Intravenous Every 24 hours 02/27/19 1111 03/03/19 1309       Subjective: Patient seen and examined at bedside.  Poor historian.  Feels okay.  Denies worsening shortness of breath.  No overnight fever, vomiting or shortness of breath. Objective: Vitals:   03/07/19 2024 03/07/19 2026 03/08/19 0455 03/08/19 0500  BP: (!) 148/50 (!) 150/68 138/69   Pulse: 94 66 60   Resp: 18  16   Temp: 99.1 F (37.3 C)  99.3 F (37.4 C)   TempSrc: Oral  Oral   SpO2: 97%  96%   Weight:    120.4 kg  Height:        Intake/Output Summary (Last 24 hours) at 03/08/2019 0808 Last data filed at 03/08/2019 0506 Gross per 24 hour  Intake 1393 ml  Output 4225 ml  Net -2832 ml   Filed Weights   03/06/19 0500 03/07/19 0608 03/08/19 0500  Weight: 121.1 kg 127.8 kg 120.4 kg    Examination:  General exam: No acute distress poor historian.  Looks chronically ill. ENT: JVD not elevated Respiratory system: Bilateral decreased breath sounds at bases with some crackles.  No wheezing  cardiovascular system: Rate controlled, S1-S2 heard gastrointestinal system: Abdomen is nondistended, soft and nontender.  Bowel sounds are heard.  Left-sided ventral hernia present  extremities: No clubbing or cyanosis;  trace lower extremity edema present Central nervous system: Awake and alert.  Poor historian.  Moving extremities  Skin: No rashes or lesions psychiatry: Affect is flat Genitourinary: Still has Foley catheter with pinkish/still dark red urine in the bag  Data Reviewed: I have personally reviewed following labs and imaging studies  CBC: Recent Labs  Lab 03/02/19 0438 03/02/19 0438 03/03/19 0737 03/03/19 0737 03/04/19 0311 03/05/19 0721 03/06/19 0310 03/07/19 0247 03/08/19 0421  WBC 5.6   < > 4.5   < > 5.5 5.1 8.7 7.7 6.8  NEUTROABS 3.7  --  2.6  --  3.3  --   --   --  3.8  HGB 9.0*   < > 9.0*   < > 8.6* 8.1* 8.3* 7.9* 8.3*  HCT  27.2*   < > 26.4*   < > 25.8* 24.9* 25.3* 24.5* 25.1*  MCV 89.8   < > 87.7   < > 89.6 89.9 90.0 92.1 91.9  PLT 217   < > 207   < > 233 206 218 235 227   < > = values in this interval not displayed.   Basic Metabolic Panel: Recent Labs  Lab 03/02/19 0438 03/02/19 0438 03/03/19 0737 03/03/19 0737 03/04/19 0311 03/05/19 0721 03/06/19 0310 03/07/19 0247 03/08/19 0421  NA 131*   < > 136   < > 138 136 137 135 136  K 3.8   < > 3.8   < > 3.9 3.9 3.6 3.9 4.1  CL 100   < > 102   < > 102 103 104 103 101  CO2 23   < > 25   < > 26 25 24 24 25   GLUCOSE 116*   < > 117*   < > 113* 109* 115* 109* 116*  BUN 22   < > 19   < > 19 14 14 13 11   CREATININE 1.41*   < > 1.15   < > 1.38*  1.24 1.30* 1.29* 1.25*  CALCIUM 7.9*   < > 8.0*   < > 8.2* 8.2* 8.1* 7.9* 8.2*  MG 1.9  --  2.0  --   --   --   --   --  1.8   < > = values in this interval not displayed.   GFR: Estimated Creatinine Clearance: 61.9 mL/min (A) (by C-G formula based on SCr of 1.25 mg/dL (H)). Liver Function Tests: Recent Labs  Lab 03/02/19 0438 03/03/19 0737 03/04/19 0311  AST 24 19 15   ALT 32 27 23  ALKPHOS 97 82 86  BILITOT 0.4 0.3 0.5  PROT 5.2* 5.1* 5.0*  ALBUMIN 1.8* 2.0* 2.0*   No results for input(s): LIPASE, AMYLASE in the last 168 hours. No results for input(s): AMMONIA in the last 168 hours. Coagulation Profile: No results for input(s): INR, PROTIME in the last 168 hours. Cardiac Enzymes: No results for input(s): CKTOTAL, CKMB, CKMBINDEX, TROPONINI in the last 168 hours. BNP (last 3 results) No results for input(s): PROBNP in the last 8760 hours. HbA1C: No results for input(s): HGBA1C in the last 72 hours. CBG: No results for input(s): GLUCAP in the last 168 hours. Lipid Profile: No results for input(s): CHOL, HDL, LDLCALC, TRIG, CHOLHDL, LDLDIRECT in the last 72 hours. Thyroid Function Tests: Recent Labs    03/07/19 0247  TSH 0.013*  FREET4 1.74*   Anemia Panel: No results for input(s): VITAMINB12,  FOLATE, FERRITIN, TIBC, IRON, RETICCTPCT in the last 72 hours. Sepsis Labs: No results for input(s): PROCALCITON, LATICACIDVEN in the last 168 hours.  Recent Results (from the past 240 hour(s))  Culture, Urine     Status: Abnormal   Collection Time: 02/27/19  9:36 AM   Specimen: Urine, Random  Result Value Ref Range Status   Specimen Description URINE, RANDOM  Final   Special Requests   Final    NONE Performed at [facility], [address], [place], [place] [address]    Culture (A)  Final    >=100,000 COLONIES/mL PROTEUS MIRABILIS 90,000 COLONIES/mL KLEBSIELLA OXYTOCA    Report Status 03/02/2019 FINAL  Final   Organism ID, Bacteria KLEBSIELLA OXYTOCA (A)  Final   Organism ID, Bacteria PROTEUS MIRABILIS (A)  Final      Susceptibility   Klebsiella oxytoca - MIC*    AMPICILLIN >=32 RESISTANT Resistant     CEFAZOLIN >=64 RESISTANT Resistant     CEFTRIAXONE <=0.25 SENSITIVE Sensitive     CIPROFLOXACIN <=0.25 SENSITIVE Sensitive     GENTAMICIN <=1 SENSITIVE Sensitive     IMIPENEM <=0.25 SENSITIVE Sensitive     NITROFURANTOIN <=16 SENSITIVE Sensitive     TRIMETH/SULFA <=20 SENSITIVE Sensitive     AMPICILLIN/SULBACTAM 16 INTERMEDIATE Intermediate     PIP/TAZO <=4 SENSITIVE Sensitive     * 90,000 COLONIES/mL KLEBSIELLA OXYTOCA   Proteus mirabilis - MIC*    AMPICILLIN <=2 SENSITIVE Sensitive     CEFAZOLIN <=4 SENSITIVE Sensitive     CEFTRIAXONE <=0.25 SENSITIVE Sensitive     CIPROFLOXACIN 1 SENSITIVE Sensitive     GENTAMICIN <=1 SENSITIVE Sensitive     IMIPENEM 4 SENSITIVE Sensitive     NITROFURANTOIN 256 RESISTANT Resistant     TRIMETH/SULFA <=20 SENSITIVE Sensitive     AMPICILLIN/SULBACTAM <=2 SENSITIVE Sensitive     PIP/TAZO <=4 SENSITIVE Sensitive     * >=100,000 COLONIES/mL PROTEUS MIRABILIS         Radiology Studies: No results found.      Scheduled Meds: .  amiodarone  200 mg Oral BID  . Chlorhexidine Gluconate Cloth  6 each Topical Daily  . digoxin   0.125 mg Oral Daily  . diltiazem  360 mg Oral Daily  . docusate sodium  100 mg Oral BID  . feeding supplement  1 Container Oral BID BM  . feeding supplement (ENSURE ENLIVE)  237 mL Oral Q1500  . guaiFENesin  15 mL Per Tube Q6H  . methimazole  20 mg Oral TID  . metoprolol tartrate  200 mg Oral BID  . multivitamin with minerals  1 tablet Oral Daily  . pantoprazole  40 mg Oral BID  . pravastatin  10 mg Oral q1800  . sodium chloride flush  10-40 mL Intracatheter Q12H  . tamsulosin  0.4 mg Oral Daily   Continuous Infusions: . cefTRIAXone (ROCEPHIN)  IV 1 g (03/07/19 1046)          Aline August, MD [facility] 03/08/2019, 8:08 AM

## 2019-03-08 NOTE — Treatment Plan (Signed)
Urology Treatment Plan Note:  Patient made NPO at midnight for potential urologic procedure on 2/17. We will check urine in the morning to determine plan.

## 2019-03-08 NOTE — Progress Notes (Signed)
Visited with Derek Foster per suggested referral. He definitely needed to express his emotions concerning the loss of his wife. In addition to needed conversation he requested prayer. Will continue to be available for spiritual care as he needs.  Rev. [place].

## 2019-03-08 NOTE — Progress Notes (Signed)
Occupational Therapy Treatment Patient Details Name: Derek Foster MRN: [id number] DOB: 08/29/1935 Today's Date: 03/08/2019    History of present illness 84 year old male with history of CAD status post stent in 2011, hypertension, hyperlipidemia, GERD, hypothyroidism presented with abdominal pain, nausea, vomiting with generalized weakness since his recent Covid diagnosis on Jan 23, 2020 (patient's wife passed away from COVID-47 2 weeks ago).In the ED, he was noted to be in A. fib with RVR with rates in 140s to 160s and small bowel obstruction.  Cardiology and general surgery were consulted.   OT comments  Pt making slow progress with functional goals. Pt required min A to sit EOB with increased time and effort. Pt mod - min A + 2 sit - stand from EOB to RW to ambulate to [facility]. Pt requires frequent multimodal cues for correct hand placement, technique and safety. Pt fatigues easily. Pt seated for LB and UB ADL tasks. OT will continue to follow acutely        Follow Up Recommendations  SNF;Supervision/Assistance - 24 hour    Equipment Recommendations  Other (comment)(TBD at SNF)    Recommendations for Other Services      Precautions / Restrictions Precautions Precautions: Fall Precaution Comments: watch HR Restrictions Weight Bearing Restrictions: No       Mobility Bed Mobility Overal bed mobility: Needs Assistance Bed Mobility: Sit to Supine     Supine to sit: Min assist;HOB elevated     General bed mobility comments: min A to get EOB, cuing for technique and sequencing  Transfers Overall transfer level: Needs assistance Equipment used: Rolling walker (2 wheeled) Transfers: Sit to/from Stand Sit to Stand: Min assist;Mod assist;+2 safety/equipment;+2 physical assistance         General transfer comment: min-mod A x2 to rise from bed, cuing for hand placement and use of RW, increased time and effort to achieve full standing    Balance Overall balance assessment: Needs  assistance Sitting-balance support: Feet supported;No upper extremity supported Sitting balance-Leahy Scale: Good Sitting balance - Comments: steady EOB   Standing balance support: Bilateral upper extremity supported;During functional activity Standing balance-Leahy Scale: Poor Standing balance comment: heavy reliance on UE support from RW                           ADL either performed or assessed with clinical judgement   ADL Overall ADL's : Needs assistance/impaired Eating/Feeding: Modified independent;Sitting   Grooming: Wash/dry hands;Wash/dry face;Sitting   Upper Body Bathing: Set up;Supervision/ safety;Sitting Upper Body Bathing Details (indicate cue type and reason): simulated Lower Body Bathing: Maximal assistance;Moderate assistance;Sitting/lateral leans   Upper Body Dressing : Set up;Supervision/safety;Sitting       Toilet Transfer: Moderate assistance;Minimal assistance;+2 for safety/equipment;Ambulation;RW;Cueing for safety;Cueing for sequencing   Toileting- Clothing Manipulation and Hygiene: Maximal assistance;Sit to/from stand Toileting - Clothing Manipulation Details (indicate cue type and reason): pt with some residual BM upon standing from EOB, provided assist for pericare      Functional mobility during ADLs: Moderate assistance;Minimal assistance;+2 for physical assistance;Rolling walker;Cueing for safety;Cueing for sequencing       Vision Baseline Vision/History: Wears glasses Wears Glasses: At all times Patient Visual Report: No change from baseline     Perception     Praxis      Cognition Arousal/Alertness: Awake/alert Behavior During Therapy: WFL for tasks assessed/performed Overall Cognitive Status: Within Functional Limits for tasks assessed  Exercises Total Joint Exercises Long Arc Quad: AROM;Both;10 reps Marching in Standing: AROM;Both;10 reps;Seated   Shoulder  Instructions       General Comments      Pertinent Vitals/ Pain       Pain Assessment: 0-10 Pain Score: 2  Pain Location: generalized Pain Descriptors / Indicators: Discomfort Pain Intervention(s): Limited activity within patient's tolerance;Monitored during session;Repositioned  Home Living                                          Prior Functioning/Environment              Frequency  Min 2X/week        Progress Toward Goals  OT Goals(current goals can now be found in the care plan section)  Progress towards OT goals: Progressing toward goals  Acute Rehab OT Goals Patient Stated Goal: to get stronger  Plan Discharge plan remains appropriate    Co-evaluation    PT/OT/SLP Co-Evaluation/Treatment: Yes Reason for Co-Treatment: For patient/therapist safety;To address functional/ADL transfers PT goals addressed during session: Mobility/safety with mobility;Balance;Proper use of DME;Strengthening/ROM OT goals addressed during session: ADL's and self-care;Proper use of Adaptive equipment and DME      AM-PAC OT "6 Clicks" Daily Activity     Outcome Measure   Help from another person eating meals?: None Help from another person taking care of personal grooming?: A Little Help from another person toileting, which includes using toliet, bedpan, or urinal?: A Little Help from another person bathing (including washing, rinsing, drying)?: A Lot Help from another person to put on and taking off regular upper body clothing?: None Help from another person to put on and taking off regular lower body clothing?: A Lot 6 Click Score: 18    End of Session Equipment Utilized During Treatment: Gait belt;Rolling walker;Other (comment)(bariatric BSC)  OT Visit Diagnosis: Unsteadiness on feet (R26.81);Muscle weakness (generalized) (M62.81)   Activity Tolerance Patient tolerated treatment well;Patient limited by fatigue   Patient Left with call bell/phone within  reach;in chair;with chair alarm set   Nurse Communication          Time: [id number] OT Time Calculation (min): 23 min  Charges: OT General Charges $OT Visit: 1 Visit OT Treatments $Self Care/Home Management : 8-22 mins     Britt Bottom 03/08/2019, 2:14 PM

## 2019-03-08 NOTE — Progress Notes (Addendum)
Urine Output = 3000 CBI bag - 2550 (from urethral bag for day shift) = 450

## 2019-03-08 NOTE — Progress Notes (Signed)
Physical Therapy Treatment Patient Details Name: Derek Foster MRN: [id number] DOB: 1935-10-26 Today's Date: 03/08/2019    History of Present Illness 84 year old male with history of CAD status post stent in 2011, hypertension, hyperlipidemia, GERD, hypothyroidism presented with abdominal pain, nausea, vomiting with generalized weakness since his recent Covid diagnosis on 01-Feb-2020 (patient's wife passed away from COVID-19 2 weeks ago).In the ED, he was noted to be in A. fib with RVR with rates in 140s to 160s and small bowel obstruction.  Cardiology and general surgery were consulted.    PT Comments    Pt received in supine and agreeable for PT/OT co treat this date. Pt required min A to get EOB and min-mod A x2 for transfers and ambulation. Pt requires frequent multimodal cuing for safe technique and use of RW during OOB mobility. Pt fatigues extremely quickly able to ambulate ~5-8 ft at a time with min A and close chair follow before requiring seated rest break. Pt with little carryover for safe technique for descent back to chair after ambulation despite max multimodal cuing for keeping Rw close and reaching back for handrests. Pt reporting he has gotten extremely weak since having covid-19. Pt is progressing slowly towards PT goals limited by weakness and fatigue. Pt performed seated LE therex following ambulation bouts. Pt presents with dec strength, power, balance and endurance limiting functional mobility. Pt would benefit from further acute PT to cont progressing deficits and mobility. Recommendation for STR remains appropriate.     Follow Up Recommendations  SNF;Supervision/Assistance - 24 hour     Equipment Recommendations  None recommended by PT    Recommendations for Other Services       Precautions / Restrictions Precautions Precautions: Fall Precaution Comments: watch HR Restrictions Weight Bearing Restrictions: No    Mobility  Bed Mobility Overal bed mobility: Needs  Assistance Bed Mobility: Sit to Supine     Supine to sit: Min assist;HOB elevated     General bed mobility comments: min A to get EOB, cuing for technique and sequencing  Transfers Overall transfer level: Needs assistance Equipment used: Rolling walker (2 wheeled) Transfers: Sit to/from Stand Sit to Stand: Min assist;Mod assist;+2 safety/equipment;+2 physical assistance         General transfer comment: min-mod A x2 to rise from bed, cuing for hand placement and use of RW, increased time and effort to achieve full standing  Ambulation/Gait Ambulation/Gait assistance: Min assist;+2 safety/equipment Gait Distance (Feet): 20 Feet Assistive device: Rolling walker (2 wheeled) Gait Pattern/deviations: Step-to pattern;Decreased stride length;Shuffle;Trunk flexed Gait velocity: dec   General Gait Details: pt ambulated from bed to [facility] to practice transfer to [facility], pt then ambulated two more short bouts within room ~5-10 ft at a time, pt requires min A for safety with close chair follow, pt extremely weak and very little activity tolerance, pt requires max cuing for descent to chair especially with reaching back to find chair,   Stairs             Wheelchair Mobility    Modified Rankin (Stroke Patients Only)       Balance Overall balance assessment: Needs assistance Sitting-balance support: Feet supported;No upper extremity supported Sitting balance-Leahy Scale: Good Sitting balance - Comments: steady EOB   Standing balance support: Bilateral upper extremity supported Standing balance-Leahy Scale: Poor Standing balance comment: heavy reliance on UE support from RW  Cognition Arousal/Alertness: Awake/alert Behavior During Therapy: WFL for tasks assessed/performed Overall Cognitive Status: Within Functional Limits for tasks assessed                                        Exercises Total Joint Exercises Long Arc  Quad: AROM;Both;10 reps Marching in Standing: AROM;Both;10 reps;Seated    General Comments        Pertinent Vitals/Pain Pain Assessment: No/denies pain Pain Location: generalized    Home Living                      Prior Function            PT Goals (current goals can now be found in the care plan section) Progress towards PT goals: Progressing toward goals(slowly, pt extremely weak)    Frequency    Min 3X/week      PT Plan Current plan remains appropriate    Co-evaluation PT/OT/SLP Co-Evaluation/Treatment: Yes Reason for Co-Treatment: For patient/therapist safety;To address functional/ADL transfers PT goals addressed during session: Mobility/safety with mobility;Balance;Proper use of DME;Strengthening/ROM OT goals addressed during session: ADL's and self-care;Proper use of Adaptive equipment and DME      AM-PAC PT "6 Clicks" Mobility   Outcome Measure  Help needed turning from your back to your side while in a flat bed without using bedrails?: A Little Help needed moving from lying on your back to sitting on the side of a flat bed without using bedrails?: A Little Help needed moving to and from a bed to a chair (including a wheelchair)?: A Little Help needed standing up from a chair using your arms (e.g., wheelchair or bedside chair)?: A Little Help needed to walk in hospital room?: A Lot Help needed climbing 3-5 steps with a railing? : A Lot 6 Click Score: 16    End of Session Equipment Utilized During Treatment: Gait belt Activity Tolerance: Patient limited by fatigue Patient left: in chair;with call bell/phone within reach Nurse Communication: Mobility status PT Visit Diagnosis: Other abnormalities of gait and mobility (R26.89);Difficulty in walking, not elsewhere classified (R26.2);Muscle weakness (generalized) (M62.81)     Time: [id number] PT Time Calculation (min) (ACUTE ONLY): 23 min  Charges:  $Therapeutic Exercise: 8-22 mins                      Zachary George PT, DPT 1:58 PM,03/08/19    [name] 03/08/2019, 1:53 PM

## 2019-03-08 NOTE — Progress Notes (Signed)
Urology Progress Note    84 year old male with hematuria currently on CBI.  Subjective: Urine improved over the course of yesterday.  CBI turned off this morning and evaluation 3 hours later showed slightly red urine without clot on hand irrigation.  The patient strongly like to avoid going to the operating room and we will proceed conservatively with management.  CBI to be turned off today but keeping tubing connected in case it needs to be restarted.   Objective: Vital signs in last 24 hours: Temp:  [99.1 F (37.3 C)-99.3 F (37.4 C)] 99.3 F (37.4 C) (02/16 0455) Pulse Rate:  [60-94] 60 (02/16 0455) Resp:  [16-18] 16 (02/16 0455) BP: (116-150)/(45-69) 138/69 (02/16 0455) SpO2:  [96 %-100 %] 96 % (02/16 0455) Weight:  [120.4 kg] 120.4 kg (02/16 0500)  Intake/Output from previous day: 02/15 0701 - 02/16 0700 In: 1393 [P.O.:1270; I.V.:3; IV Piggyback:100] Out: [id number] [Urine:4225] Intake/Output this shift: No intake/output data recorded.  Physical Exam:  General: Alert and oriented CV: RRR Lungs: Normal work of breathing Abdomen: Soft GU: 24 French hematuria cath, urine light red with sediment in the tubing.  No clots. OFF CBI Ext: NT, No erythema  Lab Results: Recent Labs    03/06/19 0310 03/07/19 0247 03/08/19 0421  HGB 8.3* 7.9* 8.3*  HCT 25.3* 24.5* 25.1*   BMET Recent Labs    03/07/19 0247 03/08/19 0421  NA 135 136  K 3.9 4.1  CL 103 101  CO2 24 25  GLUCOSE 109* 116*  BUN 13 11  CREATININE 1.29* 1.25*  CALCIUM 7.9* 8.2*    Studies/Results: No results found.  Assessment/Plan:  84 y.o. male with hematuria, due to unknown cause. Weaning CBI and proceeding conservatively  - Stop CBI inflow for 2/16. Hopefully can remove foley on AM of 2/17 but urology needs to evaluate first prior to removing  -Urology will continue to follow   Dispo: Floor   LOS: 25 days   Tharon Aquas 03/08/2019, 7:31 AM

## 2019-03-08 NOTE — Progress Notes (Signed)
CBI stopped/clamp per order. Urine output = 600 CBI bag - 1675 (from urethral bag) = 1075

## 2019-03-08 NOTE — Progress Notes (Signed)
Nutrition Follow-up  DOCUMENTATION CODES:   Obesity unspecified  INTERVENTION:   ContinueBoost Breeze poBID, each supplement provides 250 kcal and 9 grams of protein  ContinueEnsure Enlive poonce daily, each supplement provides 350 kcal and 20 grams of protein  Continue MVI with minerals daily  Encourage adequate PO intake.  NUTRITION DIAGNOSIS:   Increased nutrient needs related to acute illness(COVID-19) as evidenced by estimated needs.  Ongoing  GOAL:   Patient will meet greater than or equal to 90% of their needs  Progressing   MONITOR:   PO intake, Supplement acceptance, Diet advancement, Labs, Weight trends, Skin, I & O's  REASON FOR ASSESSMENT:   NPO/Clear Liquid Diet    ASSESSMENT:   Derek Foster  is a 84 y.o. male, with past medical history of CAD, status post stent in 2011, hypertension, hyperlipidemia, GERD, hypothyroidism, patient presents with complaints of abdominal pain, nausea, vomiting for last 24 hours, patient report overall generalized weakness, since his recent Covid diagnosis 12/30,(patient's wife passed away from COVID-19 2 weeks ago), patient denies any chest pain, fever, chills, hemoptysis, diarrhea, as well patient reports he has generalized weakness since his COVID-19 diagnosis, has been mainly using his wheelchair, as well he reports poor appetite, did not require admission or any treatment for his COVID-19.- in ED patient was noted to be in A. fib with RVR.  Reviewed I/O's: -2.8 L x 24 hours and -22 L since 02/22/19  UOP: 4.2 L x 24 hours  Pt working with therapy at time of visit.   Case discussed with RN, who reports he is progressing well, but very flat due to recently losing his wife to [place]. Per RN, pt is eating well and taking supplements. Noted meal completion 80-100%. Boost Breeze and [employer] ordered; pt is consuming these.   Per RN, plan to d/c to SNF once medically stable.   Labs reviewed.   Diet Order:   Diet  Order            Diet Heart Room service appropriate? Yes; Fluid consistency: Thin; Fluid restriction: 1500 mL Fluid  Diet effective now              EDUCATION NEEDS:   No education needs have been identified at this time  Skin:  Skin Assessment: Reviewed RN Assessment  Last BM:  03/08/19  Height:   Ht Readings from Last 1 Encounters:  02/11/19 6\' 1"  (1.854 m)    Weight:   Wt Readings from Last 1 Encounters:  03/08/19 120.4 kg    Ideal Body Weight:  83.6 kg  BMI:  Body mass index is 35.02 kg/m.  Estimated Nutritional Needs:   Kcal:  2100-2300  Protein:  105-125 grams  Fluid:  1.5 L/day    Derek Foster, RD, LDN, CDCES Registered Dietitian II Certified Diabetes Care and Education Specialist Please refer to [facility] for RD and/or RD on-call/weekend/after hours pager

## 2019-03-09 ENCOUNTER — Encounter (HOSPITAL_COMMUNITY): Payer: Self-pay | Admitting: Internal Medicine

## 2019-03-09 ENCOUNTER — Other Ambulatory Visit: Payer: Self-pay | Admitting: Urology

## 2019-03-09 ENCOUNTER — Inpatient Hospital Stay (HOSPITAL_COMMUNITY): Payer: PPO | Admitting: Anesthesiology

## 2019-03-09 ENCOUNTER — Encounter (HOSPITAL_COMMUNITY)
Admission: EM | Disposition: A | Discharge status: Skilled Nursing Facility | Payer: Self-pay | Source: Home / Self Care | Attending: Internal Medicine

## 2019-03-09 DIAGNOSIS — Z66 Do not resuscitate: Secondary | ICD-10-CM

## 2019-03-09 DIAGNOSIS — F4321 Adjustment disorder with depressed mood: Secondary | ICD-10-CM

## 2019-03-09 DIAGNOSIS — R531 Weakness: Secondary | ICD-10-CM

## 2019-03-09 DIAGNOSIS — Z515 Encounter for palliative care: Secondary | ICD-10-CM

## 2019-03-09 DIAGNOSIS — R310 Gross hematuria: Secondary | ICD-10-CM

## 2019-03-09 HISTORY — PX: CYSTOSCOPY/RETROGRADE/URETEROSCOPY: SHX5316

## 2019-03-09 LAB — CBC WITH DIFFERENTIAL/PLATELET
Abs Immature Granulocytes: 0 10*3/uL (ref 0.00–0.07)
Basophils Absolute: 0.1 10*3/uL (ref 0.0–0.1)
Basophils Relative: 1 %
Eosinophils Absolute: 0.3 10*3/uL (ref 0.0–0.5)
Eosinophils Relative: 4 %
HCT: 24.1 % — ABNORMAL LOW (ref 39.0–52.0)
Hemoglobin: 7.9 g/dL — ABNORMAL LOW (ref 13.0–17.0)
Lymphocytes Relative: 15 %
Lymphs Abs: 1 10*3/uL (ref 0.7–4.0)
MCH: 30 pg (ref 26.0–34.0)
MCHC: 32.8 g/dL (ref 30.0–36.0)
MCV: 91.6 fL (ref 80.0–100.0)
Monocytes Absolute: 0.5 10*3/uL (ref 0.1–1.0)
Monocytes Relative: 8 %
Neutro Abs: 4.8 10*3/uL (ref 1.7–7.7)
Neutrophils Relative %: 72 %
Platelets: 217 10*3/uL (ref 150–400)
RBC: 2.63 MIL/uL — ABNORMAL LOW (ref 4.22–5.81)
RDW: 14.6 % (ref 11.5–15.5)
WBC: 6.7 10*3/uL (ref 4.0–10.5)
nRBC: 0 % (ref 0.0–0.2)
nRBC: 0 /100 WBC

## 2019-03-09 LAB — BASIC METABOLIC PANEL
Anion gap: 10 (ref 5–15)
BUN: 12 mg/dL (ref 8–23)
CO2: 24 mmol/L (ref 22–32)
Calcium: 8.1 mg/dL — ABNORMAL LOW (ref 8.9–10.3)
Chloride: 103 mmol/L (ref 98–111)
Creatinine, Ser: 1.46 mg/dL — ABNORMAL HIGH (ref 0.61–1.24)
GFR calc Af Amer: 51 mL/min — ABNORMAL LOW (ref >60)
GFR calc non Af Amer: 44 mL/min — ABNORMAL LOW (ref >60)
Glucose, Bld: 105 mg/dL — ABNORMAL HIGH (ref 70–99)
Potassium: 4 mmol/L (ref 3.5–5.1)
Sodium: 137 mmol/L (ref 135–145)

## 2019-03-09 LAB — MAGNESIUM: Magnesium: 1.8 mg/dL (ref 1.7–2.4)

## 2019-03-09 LAB — SURGICAL PCR SCREEN
MRSA, PCR: NEGATIVE
Staphylococcus aureus: NEGATIVE

## 2019-03-09 SURGERY — CYSTOSCOPY/RETROGRADE/URETEROSCOPY
Anesthesia: General | Laterality: Bilateral

## 2019-03-09 MED ORDER — ONDANSETRON HCL 4 MG/2ML IJ SOLN
INTRAMUSCULAR | Status: AC
  Filled 2019-03-09: qty 2

## 2019-03-09 MED ORDER — STERILE WATER FOR IRRIGATION IR SOLN
Status: DC | PRN
  Administered 2019-03-09: 1000 mL

## 2019-03-09 MED ORDER — DEXTROSE-NACL 5-0.45 % IV SOLN: INTRAVENOUS | Status: DC

## 2019-03-09 MED ORDER — ONDANSETRON HCL 4 MG/2ML IJ SOLN
INTRAMUSCULAR | Status: DC | PRN
  Administered 2019-03-09: 4 mg via INTRAVENOUS

## 2019-03-09 MED ORDER — SODIUM CHLORIDE 0.9 % IV SOLN
1.0000 g | Freq: Once | INTRAVENOUS | Status: DC
  Filled 2019-03-09: qty 10

## 2019-03-09 MED ORDER — PHENYLEPHRINE HCL-NACL 10-0.9 MG/250ML-% IV SOLN
INTRAVENOUS | Status: DC | PRN
  Administered 2019-03-09: 50 ug/min via INTRAVENOUS

## 2019-03-09 MED ORDER — DEXAMETHASONE SODIUM PHOSPHATE 10 MG/ML IJ SOLN
INTRAMUSCULAR | Status: AC
  Filled 2019-03-09: qty 1

## 2019-03-09 MED ORDER — PHENYLEPHRINE 40 MCG/ML (10ML) SYRINGE FOR IV PUSH (FOR BLOOD PRESSURE SUPPORT)
PREFILLED_SYRINGE | INTRAVENOUS | Status: AC
  Filled 2019-03-09: qty 10

## 2019-03-09 MED ORDER — LACTATED RINGERS IV SOLN: INTRAVENOUS | Status: DC

## 2019-03-09 MED ORDER — CIPROFLOXACIN IN D5W 400 MG/200ML IV SOLN
400.0000 mg | INTRAVENOUS | Status: AC
  Administered 2019-03-09: 400 mg via INTRAVENOUS
  Filled 2019-03-09 (×2): qty 200

## 2019-03-09 MED ORDER — LIDOCAINE HCL URETHRAL/MUCOSAL 2 % EX GEL
CUTANEOUS | Status: AC
  Filled 2019-03-09: qty 20

## 2019-03-09 MED ORDER — BELLADONNA ALKALOIDS-OPIUM 16.2-60 MG RE SUPP
RECTAL | Status: DC | PRN
  Administered 2019-03-09: 1 via RECTAL

## 2019-03-09 MED ORDER — LIDOCAINE HCL (CARDIAC) PF 100 MG/5ML IV SOSY
PREFILLED_SYRINGE | INTRAVENOUS | Status: DC | PRN
  Administered 2019-03-09: 60 mg via INTRAVENOUS

## 2019-03-09 MED ORDER — OXYCODONE HCL 5 MG/5ML PO SOLN: 5.0000 mg | Freq: Once | ORAL | Status: DC | PRN

## 2019-03-09 MED ORDER — PHENYLEPHRINE 40 MCG/ML (10ML) SYRINGE FOR IV PUSH (FOR BLOOD PRESSURE SUPPORT)
PREFILLED_SYRINGE | INTRAVENOUS | Status: DC | PRN
  Administered 2019-03-09: 80 ug via INTRAVENOUS

## 2019-03-09 MED ORDER — DEXAMETHASONE SODIUM PHOSPHATE 10 MG/ML IJ SOLN
INTRAMUSCULAR | Status: DC | PRN
  Administered 2019-03-09: 4 mg via INTRAVENOUS

## 2019-03-09 MED ORDER — BELLADONNA ALKALOIDS-OPIUM 16.2-60 MG RE SUPP
1.0000 | Freq: Once | RECTAL | Status: DC
  Filled 2019-03-09 (×2): qty 1

## 2019-03-09 MED ORDER — FENTANYL CITRATE (PF) 100 MCG/2ML IJ SOLN
INTRAMUSCULAR | Status: DC | PRN
  Administered 2019-03-09: 25 ug via INTRAVENOUS
  Administered 2019-03-09: 50 ug via INTRAVENOUS

## 2019-03-09 MED ORDER — FINASTERIDE 5 MG PO TABS
5.0000 mg | ORAL_TABLET | Freq: Every day | ORAL | Status: DC
  Administered 2019-03-09 – 2019-03-12 (×4): 5 mg via ORAL
  Filled 2019-03-09 (×4): qty 1

## 2019-03-09 MED ORDER — FENTANYL CITRATE (PF) 250 MCG/5ML IJ SOLN
INTRAMUSCULAR | Status: AC
  Filled 2019-03-09: qty 5

## 2019-03-09 MED ORDER — OXYCODONE HCL 5 MG PO TABS: 5.0000 mg | ORAL_TABLET | Freq: Once | ORAL | Status: DC | PRN

## 2019-03-09 MED ORDER — SODIUM CHLORIDE 0.9 % IR SOLN
Status: DC | PRN
  Administered 2019-03-09: 9000 mL
  Administered 2019-03-09: 1

## 2019-03-09 MED ORDER — PROPOFOL 10 MG/ML IV BOLUS
INTRAVENOUS | Status: DC | PRN
  Administered 2019-03-09: 140 mg via INTRAVENOUS

## 2019-03-09 MED ORDER — ONDANSETRON HCL 4 MG/2ML IJ SOLN: 4.0000 mg | Freq: Once | INTRAMUSCULAR | Status: DC | PRN

## 2019-03-09 MED ORDER — PROPOFOL 10 MG/ML IV BOLUS
INTRAVENOUS | Status: AC
  Filled 2019-03-09: qty 20

## 2019-03-09 MED ORDER — FENTANYL CITRATE (PF) 100 MCG/2ML IJ SOLN: 25.0000 ug | INTRAMUSCULAR | Status: DC | PRN

## 2019-03-09 SURGICAL SUPPLY — 34 items
BAG URO CATCHER STRL LF (MISCELLANEOUS) ×2 IMPLANT
BASKET LASER NITINOL 1.9FR (BASKET) IMPLANT
BASKET STNLS GEMINI 4WIRE 3FR (BASKET) IMPLANT
BASKET ZERO TIP NITINOL 2.4FR (BASKET) IMPLANT
CATH URET 5FR 28IN CONE TIP (BALLOONS) ×1
CATH URET 5FR 28IN OPEN ENDED (CATHETERS) ×2 IMPLANT
CATH URET 5FR 70CM CONE TIP (BALLOONS) ×1 IMPLANT
CATH URET DUAL LUMEN 6-10FR 50 (CATHETERS) IMPLANT
COVER WAND RF STERILE (DRAPES) ×2 IMPLANT
ELECT REM PT RETURN 9FT ADLT (ELECTROSURGICAL)
ELECTRODE REM PT RTRN 9FT ADLT (ELECTROSURGICAL) IMPLANT
GLOVE BIO SURGEON STRL SZ7.5 (GLOVE) ×2 IMPLANT
GOWN STRL REUS W/ TWL LRG LVL3 (GOWN DISPOSABLE) ×1 IMPLANT
GOWN STRL REUS W/ TWL XL LVL3 (GOWN DISPOSABLE) ×1 IMPLANT
GOWN STRL REUS W/TWL LRG LVL3 (GOWN DISPOSABLE) ×1
GOWN STRL REUS W/TWL XL LVL3 (GOWN DISPOSABLE) ×1
GUIDEWIRE ANG ZIPWIRE 038X150 (WIRE) IMPLANT
GUIDEWIRE STR DUAL SENSOR (WIRE) ×2 IMPLANT
IV NS IRRIG 3000ML ARTHROMATIC (IV SOLUTION) ×2 IMPLANT
KIT BALLIN UROMAX 15FX10 (LABEL) IMPLANT
KIT BALLN UROMAX 15FX4 (MISCELLANEOUS) IMPLANT
KIT BALLN UROMAX 26 75X4 (MISCELLANEOUS)
KIT TURNOVER KIT B (KITS) ×2 IMPLANT
LOOP CUT BIPOLAR 24F LRG (ELECTROSURGICAL) ×2 IMPLANT
MANIFOLD NEPTUNE II (INSTRUMENTS) ×2 IMPLANT
NS IRRIG 1000ML POUR BTL (IV SOLUTION) ×2 IMPLANT
PACK CYSTO (CUSTOM PROCEDURE TRAY) ×2 IMPLANT
SET HIGH PRES BAL DIL (LABEL)
SET IRRIG Y TYPE TUR BLADDER L (SET/KITS/TRAYS/PACK) ×2 IMPLANT
SOL PREP POV-IOD 4OZ 10% (MISCELLANEOUS) ×2 IMPLANT
SYR TOOMEY IRRIG 70ML (MISCELLANEOUS) ×2
SYRINGE IRR TOOMEY STRL 70CC (SYRINGE) IMPLANT
SYRINGE TOOMEY IRRIG 70ML (MISCELLANEOUS) ×1 IMPLANT
TUBE CONNECTING 12X1/4 (SUCTIONS) ×2 IMPLANT

## 2019-03-09 NOTE — Progress Notes (Signed)
Pt received back from PACU s/p surgery cysto and clot evacuation.  Pt is awake and alert and urine in Foley is clear, yellow. Pt calling family to update them.

## 2019-03-09 NOTE — Consult Note (Signed)
Consultation Note Date: 03/09/2019   Patient Name: Derek Foster  DOB: 1935-08-08  MRN: [id number]  Age / Sex: 84 y.o., male  PCP: Derek Squibb, MD Referring Physician: Lorella Nimrod, MD  Reason for Consultation: Establishing goals of care and Psychosocial/spiritual support  HPI/Patient Profile: 84 y.o. male   admitted on 02/11/2019 with past medical  history of CAD (stent in 2011), hypertension, hyperlipidemia, GERD who was admitted to the hospital in 2018-02-21 with Covid pneumonia.  He has recovered from Covid and was transferred to the regular floor on 03/06/2019 on standard precautions.  Of note his wife unfortunately passed away from Covid in 02-21-2022 when they were diagnosed simultaneously.  His hospital course has been complicated by A. fib with RVR and a small bowel obstruction.  Small bowel obstruction was treated with NG tube and bowel rest.  Now resolved.  For his A. fib he was put on Cardizem drip and metoprolol and digoxin.  He also developed hematuria over the last 2 weeks..  Eliquis was held and he was started on three-way irrigation at one point with a small lumen catheter.  However he was not assessed by a urologist and he never had a hematuria catheter placed.    It has been difficult to get his hematuria under control and urology was consulted on 03/06/2019 for assistance.  Plan is for cystoscopy today for evaluation and treatment.   Patient faces treatment option decisions, advanced directive decisions, and anticipatory care needs.   Clinical Assessment and Goals of Care:   This NP Wadie Lessen reviewed medical records, received report from team, assessed the patient and then meet at the patient's bedside  to discuss diagnosis, prognosis, GOC, EOL wishes disposition and options.  Concept of Palliative Care was discussed  A discussion was had today regarding advanced directives.   Concepts specific to code status, artifical feeding and hydration, continued IV antibiotics and rehospitalization was had.  The difference between a aggressive medical intervention path  and a palliative comfort care path for this patient at this time was had.  Values and goals of care important to patient and family were attempted to be elicited.  Created space and opportunity for patient to explore his thoughts and feelings regarding his current medical situation.  He is mostly sad and grieving the loss of his wife of 51 years.  It has made it all the more difficult being separated from his other family members, he has three children    He denies "deep depression" , we discussed normal grief process.  He tells me he remains hopeful for the future and hopes for continued improvement  and return home in the near future.  He is open and hopeful for rehabilitation with  CIR   Questions and concerns addressed.   Patient  encouraged to call with questions or concerns.      PMT will continue to support holistically.    I then spoke to son /Derek Foster to cover above topics with him.  There is no documented healthcare power of attorney or advanced directive.  The patient tells me that in the event that he cannot speak for himself he wishes for his son Derek Foster to be his spokesperson.  We will put a consult into spiritual care to assist his family in securing advanced directive documents.     SUMMARY OF RECOMMENDATIONS    Code Status/Advance Care Planning:  DNR   Continue ongoing discussion tomorrow    Palliative Prophylaxis:   Aspiration, Bowel Regimen, Delirium Protocol, Frequent Pain Assessment and Oral Care  Additional Recommendations (Limitations, Scope, Preferences):  Full scope of care  Psycho-social/Spiritual:   Desire for further Chaplaincy support:yes  Additional Recommendations: Created space and opportunity for patient's family to explore their thoughts and feelings  regarding current medical situation.  This has been a very difficult time for this family.     Mrs. Coots died several weeks ago with complications of Covid.  Good family support  Prognosis:   Unable to determine  Discharge Planning: To Be Determined      Primary Diagnoses: Present on Admission: . Essential hypertension . Mixed hyperlipidemia . Atrial fibrillation with RVR ([place]) . AKI (acute kidney injury) ([place]) . SBO (small bowel obstruction) ([place])   I have reviewed the medical record, interviewed the patient and family, and examined the patient. The following aspects are pertinent.  Past Medical History:  Diagnosis Date  . Acute myocardial infarction, unspecified site, episode of care unspecified   . CAD in native artery    a. cath 01/27/2009 : s/p promus DES to LAD and medical managment of 70-80% mRCA  . Edema   . HTN (hypertension)   . Mixed hyperlipidemia   . Unspecified hypothyroidism    Social History   Socioeconomic History  . Marital status: Married    Spouse name: Not on file  . Number of children: Not on file  . Years of education: Not on file  . Highest education level: Not on file  Occupational History  . Occupation: Charity fundraiser  Tobacco Use  . Smoking status: Former Smoker    Quit date: 01/20/1957    Years since quitting: 62.1  . Smokeless tobacco: Never Used  Substance and Sexual Activity  . Alcohol use: No  . Drug use: Never  . Sexual activity: Not on file  Other Topics Concern  . Not on file  Social History Narrative  . Not on file   Social Determinants of Health   Financial Resource Strain:   . Difficulty of Paying Living Expenses: Not on file  Food Insecurity:   . Worried About Charity fundraiser in the Last Year: Not on file  . Ran Out of Food in the Last Year: Not on file  Transportation Needs:   . Lack of Transportation (Medical): Not on file  . Lack of Transportation (Non-Medical): Not on file  Physical Activity:   . Days of Exercise  per Week: Not on file  . Minutes of Exercise per Session: Not on file  Stress:   . Feeling of Stress : Not on file  Social Connections:   . Frequency of Communication with Friends and Family: Not on file  . Frequency of Social Gatherings with Friends and Family: Not on file  . Attends Religious Services: Not on file  . Active Member of Clubs or Organizations: Not on file  . Attends Archivist Meetings: Not on file  . Marital Status: Not on file   Family History  Problem  Relation Age of Onset  . Heart attack Father   . Lung cancer Sister    Scheduled Meds: . amiodarone  200 mg Oral BID  . Chlorhexidine Gluconate Cloth  6 each Topical Daily  . digoxin  0.125 mg Oral Daily  . diltiazem  360 mg Oral Daily  . feeding supplement  1 Container Oral BID BM  . feeding supplement (ENSURE ENLIVE)  237 mL Oral Q1500  . methimazole  20 mg Oral TID  . metoprolol tartrate  200 mg Oral BID  . multivitamin with minerals  1 tablet Oral Daily  . pantoprazole  40 mg Oral BID  . pravastatin  10 mg Oral q1800  . senna-docusate  1 tablet Oral BID  . sodium chloride flush  10-40 mL Intracatheter Q12H  . tamsulosin  0.4 mg Oral Daily   Continuous Infusions: . dextrose 5 % and 0.45% NaCl     PRN Meds:.acetaminophen **OR** [DISCONTINUED] acetaminophen, bisacodyl, guaiFENesin-dextromethorphan, HYDROcodone-acetaminophen, menthol-cetylpyridinium, ondansetron (ZOFRAN) IV, phenol, polyethylene glycol, sodium chloride flush Medications Prior to Admission:  Prior to Admission medications   Medication Sig Start Date End Date Taking? Authorizing Provider  amLODipine (NORVASC) 5 MG tablet Take 5 mg by mouth daily.  12/31/18  Yes [provider]  Ascorbic Acid (VITAMIN C) 500 MG tablet Take 500 mg by mouth daily.     Yes [provider]  aspirin 81 MG tablet Take 81 mg by mouth daily.     Yes [provider]  benzonatate (TESSALON) 100 MG capsule Take 100 mg by mouth 3  (three) times daily as needed for cough.   Yes [provider]  bisoprolol-hydrochlorothiazide (ZIAC) 2.5-6.25 MG tablet Take 1 tablet by mouth daily.    Yes [provider]  Coenzyme Q10 200 MG capsule Take 200 mg by mouth daily.     Yes [provider]  diclofenac Sodium (VOLTAREN) 1 % GEL Apply 2 g topically 4 (four) times daily as needed (for pain).  01/03/19  Yes [provider]  fish oil-omega-3 fatty acids 1000 MG capsule Take 2 g by mouth daily.     Yes [provider]  irbesartan (AVAPRO) 300 MG tablet Take 300 mg by mouth daily.   Yes [provider]  lansoprazole (PREVACID SOLUTAB) 30 MG disintegrating tablet Take 30 mg by mouth daily.     Yes [provider]  Multiple Vitamin (MULTIVITAMIN) tablet Take 1 tablet by mouth daily.     Yes [provider]  pravastatin (PRAVACHOL) 10 MG tablet Take 1 tablet (10 mg total) by mouth every evening. 09/20/13 02/11/19 Yes Josue Hector, MD  predniSONE (DELTASONE) 20 MG tablet Take 40 mg by mouth daily. 5 day course starting 02/03/2019 02/03/19  Yes [provider]  promethazine (PHENERGAN) 25 MG tablet Take 25 mg by mouth every 6 (six) hours as needed for nausea or vomiting.  12/20/18  Yes [provider]  nitroGLYCERIN (NITROSTAT) 0.4 MG SL tablet Place 1 tablet (0.4 mg total) under the tongue every 5 (five) minutes as needed. 06/16/12   Josue Hector, MD  simvastatin (ZOCOR) 20 MG tablet Take 1 tablet (20 mg total) by mouth every evening. 02/25/11 04/02/11  Josue Hector, MD   Allergies  Allergen Reactions  . Indomethacin Anaphylaxis    But tolerates ibuprofen, aleve  . Iodine-131 Anaphylaxis   Review of Systems  Constitutional: Positive for fatigue.  Neurological: Positive for weakness.    Physical Exam Cardiovascular:  Rate and Rhythm: Normal rate.  Pulmonary:     Effort: Pulmonary effort is normal.  Skin:    General: Skin is warm and dry.    Neurological:     Mental Status: He is alert and oriented to person, place, and time.     Vital Signs: BP 140/60 (BP Location: Right Arm)   Pulse 66   Temp 98.5 F (36.9 C) (Oral)   Resp 18   Ht 6\' 1"  (1.854 m)   Wt 120.6 kg   SpO2 95%   BMI 35.08 kg/m  Pain Scale: 0-10   Pain Score: Asleep   SpO2: SpO2: 95 % O2 Device:SpO2: 95 % O2 Flow Rate: .O2 Flow Rate (L/min): 1 L/min  IO: Intake/output summary:   Intake/Output Summary (Last 24 hours) at 03/09/2019 [id number] Last data filed at 03/09/2019 [id number] Gross per 24 hour  Intake 1172 ml  Output 1600 ml  Net -428 ml    LBM: Last BM Date: 03/06/19 Baseline Weight: Weight: 128.4 kg Most recent weight: Weight: 120.6 kg     Palliative Assessment/Data: 40 % at best   Discussed with Dr Reesa Chew via secure chat  Time In: 0800 Time Out: 0915 Time Total: 75 minutes Greater than 50%  of this time was spent counseling and coordinating care related to the above assessment and plan.  Signed by: Wadie Lessen, NP   Please contact Palliative Medicine Team phone at [phone] for questions and concerns.  For individual provider: See Shea Evans

## 2019-03-09 NOTE — Progress Notes (Signed)
CBI clamped/stopped at 0445 per order.  Urine color light red and clear.

## 2019-03-09 NOTE — Anesthesia Preprocedure Evaluation (Addendum)
Anesthesia Evaluation  Patient identified by MRN, date of birth, ID band Patient awake    Reviewed: Allergy & Precautions, NPO status , Patient's Chart, lab work & pertinent test results  History of Anesthesia Complications Negative for: history of anesthetic complications  Airway Mallampati: III  TM Distance: >3 FB Neck ROM: Full    Dental  (+) Dental Advisory Given   Pulmonary former smoker,    Pulmonary exam normal        Cardiovascular hypertension, Pt. on medications + CAD, + Past MI and + Cardiac Stents  + dysrhythmias Atrial Fibrillation  Rhythm:Irregular Rate:Normal     Neuro/Psych negative neurological ROS  negative psych ROS   GI/Hepatic negative GI ROS, Neg liver ROS,   Endo/Other  Hypothyroidism  Obesity   Renal/GU Renal InsufficiencyRenal disease     Musculoskeletal negative musculoskeletal ROS (+)   Abdominal   Peds  Hematology  (+) anemia ,   Anesthesia Other Findings Covid + 02/11/19, asymptomatic    Reproductive/Obstetrics                          Anesthesia Physical Anesthesia Plan  ASA: III  Anesthesia Plan: General   Post-op Pain Management:    Induction: Intravenous  PONV Risk Score and Plan: 2 and Treatment may vary due to age or medical condition and Ondansetron  Airway Management Planned: LMA  Additional Equipment: None  Intra-op Plan:   Post-operative Plan: Extubation in OR  Informed Consent: I have reviewed the patients History and Physical, chart, labs and discussed the procedure including the risks, benefits and alternatives for the proposed anesthesia with the patient or authorized representative who has indicated his/her understanding and acceptance.   Patient has DNR.  Discussed DNR with patient and Suspend DNR.   Dental advisory given  Plan Discussed with: CRNA and Anesthesiologist  Anesthesia Plan Comments:       Anesthesia  Quick Evaluation

## 2019-03-09 NOTE — Op Note (Addendum)
Preoperative diagnosis: Hematuria  Postoperative diagnosis:  Hematuria  Procedure:  Cystoscopy Clot evacuation and fulguration Foley catheter placement  Surgeon: Dr. Junious Silk  Assistant: Dr. Anthony Sar  Anesthesia: General  Complications: None  Intraoperative findings:  Urethra with 6:00 indentation in the bulbar urethra.  Likely from prior traumatic catheter placement.  This was a false lumen/depression but no real urethral injury Large approximately 5 cm clot within the bladder.  This was evacuated using a [place] syringe Bilobar prostatic hypertrophy with irritated prostatic mucosa.  Fulgurated to the level of the Veru Excellent hemostasis at the end of the procedure.  20 [place] two-way catheter placed Normal DRE/EUA with B&O placement - prostate about 60 g and smooth  EBL: Minimal  Specimens: None  Disposition of specimens: Pathology  Indication: Derek Foster is a patient who developed hematuria on the hospital for COVID-19.  A 24 French hematuria catheter was inserted over the weekend but we have been unable to clear his CBI despite daily hand irrigation.  Decision was made to proceed the operating room for cystourethroscopy with clot evacuation and fulguration. We have discussed the potential benefits and risks of the procedure, side effects of the proposed treatment, the likelihood of the patient achieving the goals of the procedure, and any potential problems that might occur during the procedure or recuperation. Informed consent has been obtained.  Description of procedure:  The patient was taken to the operating room and general anesthesia was induced.  The patient was placed in the dorsal lithotomy position, prepped and draped in the usual sterile fashion, and preoperative antibiotics were administered. A preoperative time-out was performed.   Cystourethroscopy was performed.  The patient's urethra was examined and was normal with the exception of a false passage at the  6 o'clock position of the bulbar urethra.  He had bilobar prostatic hypertrophy that was irritable.  There is small bleeding at the 1 o'clock position of the bladder neck. Upon entry into the bladder we immediately countered a 5 cm clot.    We switched to the 26 French resectoscope and entered the bladder with a visual obturator.  Using [place] syringe the clot was evacuated by filling and emptying the bladder.  Upon reinspection inspection of the bladder there was some posterior wall irritation from the catheter placement but otherwise cystourethroscopy showed no tumors, lesions.  Ureteral orifices were identified in bilateral orthotopic position.  We then switched to a bipolar working element and fulgurated the anterior bladder neck at the 1 o'clock position.  Cystoscope was drawn back to the prostatic urethra and several areas of mucosal irritation were fulgurated down to the level of the [place].  We did  not resect any tissue.  We then reentered the bladder to confirm hemostasis.  There is no further areas of concern.  When the cystoscope was withdrawn there was very minimal irritation just distal to the vigor but this was not fulgurated due to location.  The bladder was left full and an 56 [place] coud catheter was inserted without difficulty.  Draining urine was clear.  Digital rectal exam was performed which showed a approximately 60 cc prostate but no nodules.  Belladonna and opium suppository was given.  The patient appeared to tolerate the procedure well and without complications.  The patient was able to be awakened and transferred to the recovery unit in satisfactory condition.    Postoperative plan: -Foley catheter in overnight, no need for CBI -If urine is clear in the morning we will plan to remove  the catheter -Start finasteride and Flomax to help with voiding and prevent further episodes of hematuria which were likely from the prostate.

## 2019-03-09 NOTE — Anesthesia Procedure Notes (Signed)
Procedure Name: LMA Insertion Date/Time: 03/09/2019 5:30 PM Performed by: Inda Coke, CRNA Pre-anesthesia Checklist: Patient identified, Emergency Drugs available, Suction available and Patient being monitored Patient Re-evaluated:Patient Re-evaluated prior to induction Oxygen Delivery Method: Circle System Utilized Preoxygenation: Pre-oxygenation with 100% oxygen Induction Type: IV induction LMA: LMA inserted LMA Size: 5.0 Number of attempts: 1 Airway Equipment and Method: Bite block Placement Confirmation: positive ETCO2 Tube secured with: Tape Dental Injury: Teeth and Oropharynx as per pre-operative assessment

## 2019-03-09 NOTE — Progress Notes (Signed)
Urology Progress Note    84 year old male with hematuria currently on CBI.  Subjective: Trial off CBI yesterday, urine maroon.  Tried to turn off CBI again this morning, still urine in tubing will CBI off, cleared quickly with resumption of CBI.  Patient is feeling well and catheter is not need to be flushed.  Discussed options with patient and he would like to proceed with fulguration in the operating room.  Objective: Vital signs in last 24 hours: Temp:  [97.9 F (36.6 C)-98.7 F (37.1 C)] 98.5 F (36.9 C) (02/17 0516) Pulse Rate:  [66-82] 66 (02/17 0516) Resp:  [18-19] 18 (02/17 0516) BP: (124-140)/(44-66) 140/60 (02/17 0516) SpO2:  [95 %-100 %] 95 % (02/17 0516) Weight:  [120.6 kg] 120.6 kg (02/17 0453)  Intake/Output from previous day: 02/16 0701 - 02/17 0700 In: [place] [P.O.:1179; I.V.:230] Out: 1600 [Urine:1600] Intake/Output this shift: No intake/output data recorded.  Physical Exam:  General: Alert and oriented CV: RRR Lungs: Normal work of breathing Abdomen: Soft GU: 24 French hematuria cath, urine light red with sediment in the tubing.  No clots. OFF CBI Ext: NT, No erythema  Lab Results: Recent Labs    03/07/19 0247 03/08/19 0421 03/09/19 0429  HGB 7.9* 8.3* 7.9*  HCT 24.5* 25.1* 24.1*   BMET Recent Labs    03/08/19 0421 03/09/19 0429  NA 136 137  K 4.1 4.0  CL 101 103  CO2 25 24  GLUCOSE 116* 105*  BUN 11 12  CREATININE 1.25* 1.46*  CALCIUM 8.2* 8.1*    Studies/Results: No results found.  Assessment/Plan:  84 y.o. male with hematuria, due to unknown cause.  Attempts to wean CBI been unsuccessful and urine is still dark maroon when CBI is off.  Discussed with the patient options moving forward which include observation, intravesical therapy, cystoscopy with fulguration.  Cystoscopy fulguration is most definitive treatment and he would like to proceed.   -Keep patient n.p.o. -Ordered fluids, 100/h of D5 half-normal saline -Consent for  cystourethroscopy, clot evacuation with fulguration of bladder and prostate -Operating room today, timing TBD -Urology will continue to follow   Dispo: Floor   LOS: 26 days   Tharon Aquas 03/09/2019, 7:34 AM

## 2019-03-09 NOTE — Progress Notes (Signed)
Pt to OR via bed.  CHG baths x 2 done.  NPO p MN, report to Laurita Quint, RN.

## 2019-03-09 NOTE — Progress Notes (Signed)
PROGRESS NOTE    Derek Foster  [id number] DOB: March 28, 1935 DOA: 02/11/2019 PCP: Derek Squibb, MD   Brief Narrative:  84 year old male with history of CAD status post stent in 2011, hypertension, hyperlipidemia, GERD, hypothyroidism presented with abdominal pain, nausea, vomiting with generalized weakness since his recent Covid diagnosis on 02/03/2020 (patient's wife passed away from COVID-21 2 weeks prior to presentation). In the ED, he was noted to be in A. fib with RVR with rates in 140s to 160s and small bowel obstruction.  Cardiology and general surgery were consulted.  Treated with amiodarone drip/Cardizem drip/IV metoprolol and digoxin.  SBO was treated with NG tube and bowel rest which subsequently resolved. He was started on anticoagulation for his A. fib.  Unfortunately, he did develop significant hematuria  requiring Foley catheter insertion and holding anticoagulation. Urology was consulted. Continue to have hematuria with CBI. Patient will undergo cystoscopy today with urology.  Subjective: Patient has known complaints today.  He was waiting for his procedure.  Assessment & Plan:   Active Problems:   Mixed hyperlipidemia   Essential hypertension   CAD (coronary artery disease)   Atrial fibrillation with RVR (HCC)   AKI (acute kidney injury) ([place])   SBO (small bowel obstruction) ([place])  New onset hematuria.  Started with anticoagulation.  Continue to have gross hematuria in the bag with CBI.  Going for cystoscopy with urology today. Hemoglobin dropped to 7.9 from 8.3. -Continue holding Eliquis. -Continue to monitor hemoglobin-transfuse if below 7.  UTI.  Urine culture grew Klebsiella and Proteus. Completed course of antibiotics with Rocephin.  New onset paroxysmal atrial fibrillation/flutter with RVR.  Currently rate controlled on amiodarone, Cardizem metoprolol and digoxin. Reserved EF on echo. -Keep holding Eliquis due to hematuria.  Hyperthyroidism -TSH/T4  consistent with hyperthyroidism.  Methimazole dose has been titrated to 20 mg daily.  Outpatient follow-up with PCP/endocrinology  Small bowel obstruction Recurrent ventral incisional hernia -Likely secondary to adhesions from prior surgeries and ventral hernia -Treated conservatively with NG tube placement, bowel rest and subsequent NG tube removal.  Currently having bowel movements.  General surgery has signed off.  Currently tolerating diet.   Acute kidney injury -Likely secondary to GI losses.  Improved.  Stable.   Recent COVID-19 infection -Patient reports being diagnosed on 01-14-25at his PCP Dr. Josue Foster office completed 3-week isolation period on 1/21, repeat Covid PCR was positive on 1/22, suspect this is residual viral fragments, I called Dr. Josue Foster office and was able to confirm original positive Covid diagnosis on Feb 03, 2023 -Did have generalized weakness secondary to this without respiratory symptoms -Off isolation  History of CAD -Remote history of stents -Stable  Hypertension -Continue Cardizem and metoprolol.  BP stable  GERD -Continue oral PPI.  Generalized deconditioning -Prognosis is guarded.  PT recommends SNF placement.  Social worker following. -Palliative care evaluation has been requested for goals of care discussion.  Objective: Vitals:   03/08/19 1745 03/08/19 2043 03/09/19 0453 03/09/19 0516  BP: 140/66 (!) 132/44  140/60  Pulse: 81 74  66  Resp:  19  18  Temp:  98.7 F (37.1 C)  98.5 F (36.9 C)  TempSrc:  Oral  Oral  SpO2:  97%  95%  Weight:   120.6 kg   Height:        Intake/Output Summary (Last 24 hours) at 03/09/2019 0828 Last data filed at 03/09/2019 0538 Gross per 24 hour  Intake 1172 ml  Output 1600 ml  Net -428 ml  Filed Weights   03/07/19 0608 03/08/19 0500 03/09/19 0453  Weight: 127.8 kg 120.4 kg 120.6 kg    Examination:  General exam: Appears calm and comfortable  Respiratory system: Clear to auscultation.  Respiratory effort normal. Cardiovascular system: S1 & S2 heard, RRR. No JVD, murmurs, rubs, gallops or clicks. Gastrointestinal system: Soft, nontender, nondistended, bowel sounds positive. Central nervous system: Alert and oriented. No focal neurological deficits.Symmetric 5 x 5 power. Extremities: No edema, no cyanosis, pulses intact and symmetrical. Skin: No rashes, lesions or ulcers Psychiatry: Judgement and insight appear normal. Mood & affect appropriate.    DVT prophylaxis: Eliquis on hold Code Status: DNR Family Communication: No family at bedside Disposition Plan: Pending improvement in his current hematuria. Will go to SNF.  Consultants:   Urology  Cardiology  General surgery  Procedures:  Antimicrobials:   Data Reviewed: I have personally reviewed following labs and imaging studies  CBC: Recent Labs  Lab 03/03/19 0737 03/03/19 0737 03/04/19 0311 03/04/19 0311 03/05/19 0721 03/06/19 0310 03/07/19 0247 03/08/19 0421 03/09/19 0429  WBC 4.5   < > 5.5   < > 5.1 8.7 7.7 6.8 6.7  NEUTROABS 2.6  --  3.3  --   --   --   --  3.8 4.8  HGB 9.0*   < > 8.6*   < > 8.1* 8.3* 7.9* 8.3* 7.9*  HCT 26.4*   < > 25.8*   < > 24.9* 25.3* 24.5* 25.1* 24.1*  MCV 87.7   < > 89.6   < > 89.9 90.0 92.1 91.9 91.6  PLT 207   < > 233   < > 206 218 235 227 217   < > = values in this interval not displayed.   Basic Metabolic Panel: Recent Labs  Lab 03/03/19 0737 03/04/19 0311 03/05/19 0721 03/06/19 0310 03/07/19 0247 03/08/19 0421 03/09/19 0429  NA 136   < > 136 137 135 136 137  K 3.8   < > 3.9 3.6 3.9 4.1 4.0  CL 102   < > 103 104 103 101 103  CO2 25   < > 25 24 24 25 24   GLUCOSE 117*   < > 109* 115* 109* 116* 105*  BUN 19   < > 14 14 13 11 12   CREATININE 1.15   < > 1.24 1.30* 1.29* 1.25* 1.46*  CALCIUM 8.0*   < > 8.2* 8.1* 7.9* 8.2* 8.1*  MG 2.0  --   --   --   --  1.8 1.8   < > = values in this interval not displayed.   GFR: Estimated Creatinine Clearance: 53.1  mL/min (A) (by C-G formula based on SCr of 1.46 mg/dL (H)). Liver Function Tests: Recent Labs  Lab 03/03/19 0737 03/04/19 0311  AST 19 15  ALT 27 23  ALKPHOS 82 86  BILITOT 0.3 0.5  PROT 5.1* 5.0*  ALBUMIN 2.0* 2.0*   No results for input(s): LIPASE, AMYLASE in the last 168 hours. No results for input(s): AMMONIA in the last 168 hours. Coagulation Profile: No results for input(s): INR, PROTIME in the last 168 hours. Cardiac Enzymes: No results for input(s): CKTOTAL, CKMB, CKMBINDEX, TROPONINI in the last 168 hours. BNP (last 3 results) No results for input(s): PROBNP in the last 8760 hours. HbA1C: No results for input(s): HGBA1C in the last 72 hours. CBG: No results for input(s): GLUCAP in the last 168 hours. Lipid Profile: No results for input(s): CHOL, HDL, LDLCALC, TRIG, CHOLHDL, LDLDIRECT in the  last 72 hours. Thyroid Function Tests: Recent Labs    03/07/19 0247  TSH 0.013*  FREET4 1.74*   Anemia Panel: No results for input(s): VITAMINB12, FOLATE, FERRITIN, TIBC, IRON, RETICCTPCT in the last 72 hours. Sepsis Labs: No results for input(s): PROCALCITON, LATICACIDVEN in the last 168 hours.  Recent Results (from the past 240 hour(s))  Culture, Urine     Status: Abnormal   Collection Time: 02/27/19  9:36 AM   Specimen: Urine, Random  Result Value Ref Range Status   Specimen Description URINE, RANDOM  Final   Special Requests   Final    NONE Performed at [facility], [address] [place], [place] [address]    Culture (A)  Final    >=100,000 COLONIES/mL PROTEUS MIRABILIS 90,000 COLONIES/mL KLEBSIELLA OXYTOCA    Report Status 03/02/2019 FINAL  Final   Organism ID, Bacteria KLEBSIELLA OXYTOCA (A)  Final   Organism ID, Bacteria PROTEUS MIRABILIS (A)  Final      Susceptibility   Klebsiella oxytoca - MIC*    AMPICILLIN >=32 RESISTANT Resistant     CEFAZOLIN >=64 RESISTANT Resistant     CEFTRIAXONE <=0.25 SENSITIVE Sensitive     CIPROFLOXACIN <=0.25  SENSITIVE Sensitive     GENTAMICIN <=1 SENSITIVE Sensitive     IMIPENEM <=0.25 SENSITIVE Sensitive     NITROFURANTOIN <=16 SENSITIVE Sensitive     TRIMETH/SULFA <=20 SENSITIVE Sensitive     AMPICILLIN/SULBACTAM 16 INTERMEDIATE Intermediate     PIP/TAZO <=4 SENSITIVE Sensitive     * 90,000 COLONIES/mL KLEBSIELLA OXYTOCA   Proteus mirabilis - MIC*    AMPICILLIN <=2 SENSITIVE Sensitive     CEFAZOLIN <=4 SENSITIVE Sensitive     CEFTRIAXONE <=0.25 SENSITIVE Sensitive     CIPROFLOXACIN 1 SENSITIVE Sensitive     GENTAMICIN <=1 SENSITIVE Sensitive     IMIPENEM 4 SENSITIVE Sensitive     NITROFURANTOIN 256 RESISTANT Resistant     TRIMETH/SULFA <=20 SENSITIVE Sensitive     AMPICILLIN/SULBACTAM <=2 SENSITIVE Sensitive     PIP/TAZO <=4 SENSITIVE Sensitive     * >=100,000 COLONIES/mL PROTEUS MIRABILIS     Radiology Studies: No results found.  Scheduled Meds: . amiodarone  200 mg Oral BID  . Chlorhexidine Gluconate Cloth  6 each Topical Daily  . digoxin  0.125 mg Oral Daily  . diltiazem  360 mg Oral Daily  . feeding supplement  1 Container Oral BID BM  . feeding supplement (ENSURE ENLIVE)  237 mL Oral Q1500  . methimazole  20 mg Oral TID  . metoprolol tartrate  200 mg Oral BID  . multivitamin with minerals  1 tablet Oral Daily  . pantoprazole  40 mg Oral BID  . pravastatin  10 mg Oral q1800  . senna-docusate  1 tablet Oral BID  . sodium chloride flush  10-40 mL Intracatheter Q12H  . tamsulosin  0.4 mg Oral Daily   Continuous Infusions: . dextrose 5 % and 0.45% NaCl       LOS: 26 days   Time spent: 40 minutes  Lorella Nimrod, MD [facility]  If 7PM-7AM, please contact night-coverage [email or web address]  03/09/2019, 8:28 AM   This record has been created using Systems analyst. Errors have been sought and corrected,but may not always be located. Such creation errors do not reflect on the standard of care.

## 2019-03-09 NOTE — Transfer of Care (Signed)
Immediate Anesthesia Transfer of Care Note  Patient: Derek Foster  Procedure(s) Performed: CYSTOSCOPY, Clot Evacuation, Fulguration (Bilateral )  Patient Location: PACU  Anesthesia Type:General  Level of Consciousness: awake and alert   Airway & Oxygen Therapy: Patient Spontanous Breathing and Patient connected to nasal cannula oxygen  Post-op Assessment: Report given to RN and Post -op Vital signs reviewed and stable  Post vital signs: Reviewed and stable  Last Vitals:  Vitals Value Taken Time  BP 125/71 03/09/19 1817  Temp    Pulse 119 03/09/19 1819  Resp 17 03/09/19 1819  SpO2 96 % 03/09/19 1819  Vitals shown include unvalidated device data.  Last Pain:  Vitals:   03/09/19 1358  TempSrc: Oral  PainSc:       Patients Stated Pain Goal: 3 ([id number])  Complications: No apparent anesthesia complications

## 2019-03-10 DIAGNOSIS — Z515 Encounter for palliative care: Secondary | ICD-10-CM

## 2019-03-10 DIAGNOSIS — R531 Weakness: Secondary | ICD-10-CM

## 2019-03-10 DIAGNOSIS — Z66 Do not resuscitate: Secondary | ICD-10-CM

## 2019-03-10 DIAGNOSIS — F4321 Adjustment disorder with depressed mood: Secondary | ICD-10-CM

## 2019-03-10 LAB — CBC
HCT: 25.9 % — ABNORMAL LOW (ref 39.0–52.0)
Hemoglobin: 8.4 g/dL — ABNORMAL LOW (ref 13.0–17.0)
MCH: 29.9 pg (ref 26.0–34.0)
MCHC: 32.4 g/dL (ref 30.0–36.0)
MCV: 92.2 fL (ref 80.0–100.0)
Platelets: 230 10*3/uL (ref 150–400)
RBC: 2.81 MIL/uL — ABNORMAL LOW (ref 4.22–5.81)
RDW: 14.4 % (ref 11.5–15.5)
WBC: 6.9 10*3/uL (ref 4.0–10.5)
nRBC: 0 % (ref 0.0–0.2)

## 2019-03-10 LAB — IRON AND TIBC
Iron: 46 ug/dL (ref 45–182)
Saturation Ratios: 22 % (ref 17.9–39.5)
TIBC: 206 ug/dL — ABNORMAL LOW (ref 250–450)
UIBC: 160 ug/dL

## 2019-03-10 LAB — BASIC METABOLIC PANEL
Anion gap: 10 (ref 5–15)
BUN: 10 mg/dL (ref 8–23)
CO2: 24 mmol/L (ref 22–32)
Calcium: 8.2 mg/dL — ABNORMAL LOW (ref 8.9–10.3)
Chloride: 102 mmol/L (ref 98–111)
Creatinine, Ser: 1.09 mg/dL (ref 0.61–1.24)
GFR calc Af Amer: >60 mL/min (ref >60)
GFR calc non Af Amer: >60 mL/min (ref >60)
Glucose, Bld: 143 mg/dL — ABNORMAL HIGH (ref 70–99)
Potassium: 4.3 mmol/L (ref 3.5–5.1)
Sodium: 136 mmol/L (ref 135–145)

## 2019-03-10 LAB — FERRITIN: Ferritin: 199 ng/mL (ref 24–336)

## 2019-03-10 MED ORDER — ALFUZOSIN HCL ER 10 MG PO TB24: 10.0000 mg | ORAL_TABLET | Freq: Every day | ORAL | Status: DC

## 2019-03-10 MED ORDER — TAMSULOSIN HCL 0.4 MG PO CAPS
0.4000 mg | ORAL_CAPSULE | Freq: Every day | ORAL | Status: DC
  Administered 2019-03-11 – 2019-03-12 (×2): 0.4 mg via ORAL
  Filled 2019-03-10 (×2): qty 1

## 2019-03-10 NOTE — Progress Notes (Signed)
I responded to spiritual care consult for an AD. Derek Foster was alert and talking. I presented Derek Foster an overview of the AD and further support if he needed Notary. He stated he had discussed his care with his two sons and they know what he would want. Further, Derek Foster shared his grief of his late wife's passing this passed January. He is a Psychologist, sport and exercise. He says he has support from his pastor. He requested prayer. I offered pastoral support with empathic listening, words of comfort, prayer and ministry of presence.  He requested continued visited because he feels lonely. Chaplain will follow up at a later time.   Palliative care  Chaplain Resident Fidel Levy [phone]

## 2019-03-10 NOTE — TOC Progression Note (Addendum)
Transition of Care [facility]) - Progression Note   Patient Details  Name: Derek Foster MRN: [id number] Date of Birth: Apr 11, 1935  Transition of Care [facility]) CM/SW [place], [place] Phone Number: 03/10/2019, 10:24 AM  Clinical Narrative:    12:53pm- CSW reached out to [place] with CIR, at this time she will call and speak with family but recommendations are all for SNF at this time. Will initiate insurance auth. Pt does not need a new COVID test at this time.   12:09- order for CIR placed, await evaluation.  10:24am- CSW contacted by PMT that pt family has requested evaluation for CIR placement. At this time have requested Dr. Reesa Chew place consult, otherwise CSW following for placement at [facility] and will re-refer pt for authorization through [employer]. Pt has had his authorization started and withdrawn due to lack of medical stability multiple times this admission. TOC team remains available for disposition.    Expected Discharge Plan: [place] Barriers to Discharge: Continued Medical Work up  Expected Discharge Plan and Services Expected Discharge Plan: [place] In-house Referral: Clinical Social Work Living arrangements for the past 2 months: Single Family Home Expected Discharge Date: 02/26/19                Readmission Risk Interventions No flowsheet data found.

## 2019-03-10 NOTE — Progress Notes (Signed)
PROGRESS NOTE    Derek PETTAWAY  [id number] DOB: 01/28/1935 DOA: 02/11/2019 PCP: Celene Squibb, MD   Brief Narrative:  84 year old male with history of CAD status post stent in 2011, hypertension, hyperlipidemia, GERD, hypothyroidism presented with abdominal pain, nausea, vomiting with generalized weakness since his recent Covid diagnosis on 01/28/2020 (patient's wife passed away from COVID-66 2 weeks prior to presentation). In the ED, he was noted to be in A. fib with RVR with rates in 140s to 160s and small bowel obstruction.  Cardiology and general surgery were consulted.  Treated with amiodarone drip/Cardizem drip/IV metoprolol and digoxin.  SBO was treated with NG tube and bowel rest which subsequently resolved. He was started on anticoagulation for his A. fib.  Unfortunately, he did develop significant hematuria  requiring Foley catheter insertion and holding anticoagulation. Urology was consulted. Continue to have hematuria with CBI. Patient underwent cystoscopy with evacuation of a big clot from his bladder on 03/09/2019.  Subjective: Patient was feeling better when seen this morning.  His Foley has been taken out few minutes prior to my visit.  Has not voided yet.  Denies any complaints.  He wants to go to CIR if possible before going home instead of SNF.  Assessment & Plan:   Active Problems:   Mixed hyperlipidemia   Essential hypertension   CAD (coronary artery disease)   Atrial fibrillation with RVR (HCC)   AKI (acute kidney injury) ([place])   SBO (small bowel obstruction) ([place])   Gross hematuria   Palliative care by specialist   DNR (do not resuscitate)   Weakness generalized   Grief  New onset hematuria.  Started with anticoagulation.  Had cystoscopy with evacuation of a big clot from bladder.  See procedure report.  Urine was clear this morning, urology took off his catheter.  Waiting for spontaneous voiding. If no voiding by 4 PM he will need a bladder scan. Hemoglobin  now stable around 8.4. Iron studies within normal limit. -Continue holding Eliquis- -Continue to monitor hemoglobin-transfuse if below 7.  UTI.  Urine culture grew Klebsiella and Proteus. Completed course of antibiotics with Rocephin.  New onset paroxysmal atrial fibrillation/flutter with RVR.  Currently rate controlled on amiodarone, Cardizem metoprolol and digoxin. Reserved EF on echo. -Keep holding Eliquis due to hematuria.  Hyperthyroidism -TSH/T4 consistent with hyperthyroidism.  Methimazole dose has been titrated to 20 mg daily.  Outpatient follow-up with PCP/endocrinology  Small bowel obstruction Recurrent ventral incisional hernia -Likely secondary to adhesions from prior surgeries and ventral hernia -Treated conservatively with NG tube placement, bowel rest and subsequent NG tube removal.  Currently having bowel movements.  General surgery has signed off.  Currently tolerating diet.   Acute kidney injury -Likely secondary to GI losses.  Improved.  Stable.   Recent COVID-19 infection -Patient reports being diagnosed on 01/08/2025at his PCP Dr. Josue Hector office completed 3-week isolation period on 1/21, repeat Covid PCR was positive on 1/22, suspect this is residual viral fragments, I called Dr. Josue Hector office and was able to confirm original positive Covid diagnosis on 2023/01/28 -Did have generalized weakness secondary to this without respiratory symptoms -Off isolation  History of CAD -Remote history of stents -Stable  Hypertension -Continue Cardizem and metoprolol.  BP stable  GERD -Continue oral PPI.  Generalized deconditioning -Prognosis is guarded.  PT recommends SNF placement.  Social worker following. -Palliative care evaluation has been requested for goals of care discussion. -Patient wants to go to CIR instead of SNF.  We requested  evaluation for that.  Objective: Vitals:   03/09/19 2210 03/10/19 0235 03/10/19 0500 03/10/19 0543  BP: (!) 131/55 140/71   (!) 148/69  Pulse: 63 (!) 102  (!) 106  Resp: 15 16  16   Temp: 98.7 F (37.1 C) 98 F (36.7 C)  98.4 F (36.9 C)  TempSrc: Oral Oral  Oral  SpO2: 95% 95%  94%  Weight:   124.4 kg   Height:        Intake/Output Summary (Last 24 hours) at 03/10/2019 1336 Last data filed at 03/10/2019 1010 Gross per 24 hour  Intake 2080.93 ml  Output 1995 ml  Net 85.93 ml   Filed Weights   03/09/19 0453 03/09/19 1605 03/10/19 0500  Weight: 120.6 kg 120.6 kg 124.4 kg    Examination:  General exam: Appears calm and comfortable  Respiratory system: Clear to auscultation. Respiratory effort normal. Cardiovascular system: S1 & S2 heard, RRR. No JVD, murmurs, rubs, gallops or clicks. Gastrointestinal system: Soft, nontender, nondistended, bowel sounds positive. Central nervous system: Alert and oriented. No focal neurological deficits.Symmetric 5 x 5 power. Extremities: No edema, no cyanosis, pulses intact and symmetrical. Skin: No rashes, lesions or ulcers Psychiatry: Judgement and insight appear normal. Mood & affect appropriate.    DVT prophylaxis: Eliquis on hold Code Status: DNR Family Communication: No family at bedside Disposition Plan: Medically stable.  CIR/SNF placement, pending evaluation by CIR.  Consultants:   Urology  Cardiology  General surgery  Procedures:  Antimicrobials:   Data Reviewed: I have personally reviewed following labs and imaging studies  CBC: Recent Labs  Lab 03/04/19 0311 03/05/19 0721 03/06/19 0310 03/07/19 0247 03/08/19 0421 03/09/19 0429 03/10/19 0336  WBC 5.5   < > 8.7 7.7 6.8 6.7 6.9  NEUTROABS 3.3  --   --   --  3.8 4.8  --   HGB 8.6*   < > 8.3* 7.9* 8.3* 7.9* 8.4*  HCT 25.8*   < > 25.3* 24.5* 25.1* 24.1* 25.9*  MCV 89.6   < > 90.0 92.1 91.9 91.6 92.2  PLT 233   < > 218 235 227 217 230   < > = values in this interval not displayed.   Basic Metabolic Panel: Recent Labs  Lab 03/06/19 0310 03/07/19 0247 03/08/19 0421 03/09/19 0429  03/10/19 0336  NA 137 135 136 137 136  K 3.6 3.9 4.1 4.0 4.3  CL 104 103 101 103 102  CO2 24 24 25 24 24   GLUCOSE 115* 109* 116* 105* 143*  BUN 14 13 11 12 10   CREATININE 1.30* 1.29* 1.25* 1.46* 1.09  CALCIUM 8.1* 7.9* 8.2* 8.1* 8.2*  MG  --   --  1.8 1.8  --    GFR: Estimated Creatinine Clearance: 72.2 mL/min (by C-G formula based on SCr of 1.09 mg/dL). Liver Function Tests: Recent Labs  Lab 03/04/19 0311  AST 15  ALT 23  ALKPHOS 86  BILITOT 0.5  PROT 5.0*  ALBUMIN 2.0*   No results for input(s): LIPASE, AMYLASE in the last 168 hours. No results for input(s): AMMONIA in the last 168 hours. Coagulation Profile: No results for input(s): INR, PROTIME in the last 168 hours. Cardiac Enzymes: No results for input(s): CKTOTAL, CKMB, CKMBINDEX, TROPONINI in the last 168 hours. BNP (last 3 results) No results for input(s): PROBNP in the last 8760 hours. HbA1C: No results for input(s): HGBA1C in the last 72 hours. CBG: No results for input(s): GLUCAP in the last 168 hours. Lipid Profile: No results  for input(s): CHOL, HDL, LDLCALC, TRIG, CHOLHDL, LDLDIRECT in the last 72 hours. Thyroid Function Tests: No results for input(s): TSH, T4TOTAL, FREET4, T3FREE, THYROIDAB in the last 72 hours. Anemia Panel: No results for input(s): VITAMINB12, FOLATE, FERRITIN, TIBC, IRON, RETICCTPCT in the last 72 hours. Sepsis Labs: No results for input(s): PROCALCITON, LATICACIDVEN in the last 168 hours.  Recent Results (from the past 240 hour(s))  Surgical pcr screen     Status: None   Collection Time: 03/09/19  3:24 PM   Specimen: Nasal Mucosa; Nasal Swab  Result Value Ref Range Status   MRSA, PCR NEGATIVE NEGATIVE Final   Staphylococcus aureus NEGATIVE NEGATIVE Final    Comment: (NOTE) The Xpert SA Assay (FDA approved for NASAL specimens in patients 80 years of age and older), is one component of a comprehensive surveillance program. It is not intended to diagnose infection nor to guide  or monitor treatment. Performed at [facility], [place] [address] [place], [place] [address]      Radiology Studies: No results found.  Scheduled Meds: . amiodarone  200 mg Oral BID  . Chlorhexidine Gluconate Cloth  6 each Topical Daily  . digoxin  0.125 mg Oral Daily  . diltiazem  360 mg Oral Daily  . feeding supplement  1 Container Oral BID BM  . feeding supplement (ENSURE ENLIVE)  237 mL Oral Q1500  . finasteride  5 mg Oral Daily  . methimazole  20 mg Oral TID  . metoprolol tartrate  200 mg Oral BID  . multivitamin with minerals  1 tablet Oral Daily  . opium-belladonna  1 suppository Rectal Once  . pantoprazole  40 mg Oral BID  . pravastatin  10 mg Oral q1800  . senna-docusate  1 tablet Oral BID  . sodium chloride flush  10-40 mL Intracatheter Q12H  . tamsulosin  0.4 mg Oral Daily   Continuous Infusions:    LOS: 27 days   Time spent: 35 minutes  Lorella Nimrod, MD [facility]  If 7PM-7AM, please contact night-coverage [email or web address]  03/10/2019, 1:36 PM   This record has been created using Systems analyst. Errors have been sought and corrected,but may not always be located. Such creation errors do not reflect on the standard of care.

## 2019-03-10 NOTE — Progress Notes (Signed)
Inpatient Rehabilitation-Admissions Coordinator    IP Rehab Consult received. Met with pt bedside for rehabilitation assessment. Pt had requested CIR consult after Palliative Care mentioned it might be an option. We discussed the differences between our IP Rehab program and a SNF short term rehab program. Explained that current recommendations from therapy are for SNF, which tends to be a slower, less intensive program where he can stay for a bit of a longer stay than in CIR. Per pt, he lives alone and would only have PRN assist if any, from family. With current lack of assistance at DC and current documentation of slow progress with little carryover with safety training, AC explained that my recommendation would be for SNF placement to allow him more time for recovery and progress at his own pace. Pt verbalized understanding. He mentioned that he would be open to [facility] for rehab. AC will contact SW regarding his preference and will sign off. I attempted to call his son per his request but only got his voicemail. Will discuss my recommendations once he returns my call.   Raechel Ache, OTR/L  Rehab Admissions Coordinator  [phone] 03/10/2019 1:58 PM

## 2019-03-10 NOTE — Progress Notes (Signed)
Palliative Medicine RN Note: Derek Foster (the patient) called the PMT office this morning and asked about "that rehab that is inside the hospital" and would like our NP's help Wadie Lessen) getting him placed there. I let him know that the [facility] team coordinates referrals to CIR and that I would let them and [facility] know of his request. Secure chat sent.  Marjie Skiff Kyoko Elsea, RN, BSN, [facility] Palliative Medicine Team 03/10/2019 10:33 AM Office [phone]

## 2019-03-10 NOTE — TOC Progression Note (Addendum)
Transition of Care [facility]) - Progression Note    Patient Details  Name: ESROM SOY MRN: [id number] Date of Birth: [birth year]  Transition of Care [facility]) CM/SW [place], [place] Phone Number: 03/10/2019, 3:44 PM  Clinical Narrative:    Damaris Schooner with Mr Aube via telephone, discussed SNF placement. Pt given additional offer of [facility]. Pt prefers [facility] over [facility] [facility].   CSW called and updated [employer]. Pt under medical review, already has auth for PTAR [phone]. Await insurance approval for [facility], Marianna Fuss in admissions aware of possible dc tomorrow, no new COVID needed.    Expected Discharge Plan: [place] Barriers to Discharge: Continued Medical Work up  Expected Discharge Plan and Services Expected Discharge Plan: [place] In-house Referral: Clinical Social Work     Living arrangements for the past 2 months: Single Family Home Expected Discharge Date: 02/26/19                Readmission Risk Interventions No flowsheet data found.

## 2019-03-10 NOTE — Anesthesia Postprocedure Evaluation (Signed)
Anesthesia Post Note  Patient: DONOVIN LAMACCHIA  Procedure(s) Performed: CYSTOSCOPY, Clot Evacuation, Fulguration (Bilateral )     Patient location during evaluation: PACU Anesthesia Type: General Level of consciousness: awake and alert Pain management: pain level controlled Vital Signs Assessment: post-procedure vital signs reviewed and stable Respiratory status: spontaneous breathing, nonlabored ventilation, respiratory function stable and patient connected to nasal cannula oxygen Cardiovascular status: blood pressure returned to baseline and stable Postop Assessment: no apparent nausea or vomiting Anesthetic complications: no    Last Vitals:  Vitals:   03/10/19 0235 03/10/19 0543  BP: 140/71 (!) 148/69  Pulse: (!) 102 (!) 106  Resp: 16 16  Temp: 36.7 C 36.9 C  SpO2: 95% 94%    Last Pain:  Vitals:   03/10/19 0543  TempSrc: Oral  PainSc:    Pain Goal: Patients Stated Pain Goal: 3 (03/03/19 1639)                 Gonzalo Waymire L Juliann Olesky

## 2019-03-10 NOTE — Progress Notes (Addendum)
Urology Progress Note   1 Day Post-Op84 year old male with hematuria currently on CBI.  Subjective: Clot evacuation yesterday, urine completely clear this morning  Objective: Vital signs in last 24 hours: Temp:  [97.8 F (36.6 C)-98.7 F (37.1 C)] 98.4 F (36.9 C) (02/18 0543) Pulse Rate:  [63-106] 106 (02/18 0543) Resp:  [15-19] 16 (02/18 0543) BP: (125-148)/(50-71) 148/69 (02/18 0543) SpO2:  [94 %-98 %] 94 % (02/18 0543) Weight:  [120.6 kg-124.4 kg] 124.4 kg (02/18 0500)  Intake/Output from previous day: 02/17 0701 - 02/18 0700 In: 1820.9 [P.O.:600; I.V.:1120.9] Out: 2895 [Urine:2875; Blood:20] Intake/Output this shift: Total I/O In: 360 [P.O.:360] Out: -   Physical Exam:  General: Alert and oriented CV: RRR Lungs: Normal work of breathing Abdomen: Soft GU: 18 [place] two-way catheter with clear yellow urine Ext: NT, No erythema  Lab Results: Recent Labs    03/08/19 0421 03/09/19 0429 03/10/19 0336  HGB 8.3* 7.9* 8.4*  HCT 25.1* 24.1* 25.9*   BMET Recent Labs    03/09/19 0429 03/10/19 0336  NA 137 136  K 4.0 4.3  CL 103 102  CO2 24 24  GLUCOSE 105* 143*  BUN 12 10  CREATININE 1.46* 1.09  CALCIUM 8.1* 8.2*    Studies/Results: No results found.  Assessment/Plan:  84 y.o. male with hematuria, likely due to catheter trauma.  Evacuated large clot in the operating room on 2/17.  Now urine clear.  -Remove Foley catheter -Discontinue Flomax this is caused lightheadedness in the past, try alfuzosin -Start finasteride -Urology follow-up in several weeks assuming he passes trial of void -If he voids today he is okay to discharge from urology perspective  Addendum 6:26 PM: Pt seen and examined on rounds this evening. He hasnt had a lot to drink today and hasn't voided yet. Bladder scan was 0 earlier and now 190 (I repeated with nurses). He doesn't feel the need to void. Considered alfuzosin but there is a significant interaction with amiodarone, so will  continue tamsulosin. Monitor for voiding and bladder scan. Nurses can attempt foley if needed. Also I discussed with Dr. Anthony Sar and agree with his assessment and plan.   Dispo: Floor   LOS: 27 days   Tharon Aquas 03/10/2019, 11:11 AM

## 2019-03-10 NOTE — Progress Notes (Signed)
Patient ID: Derek Foster, male   DOB: [birth year], 84 y.o.   MRN: [id number]  This NP visited patient at the bedside as a follow up to  yesterday's [place] for palliative medicine needs and emotional support.  Patient is alert and oriented, she s/p cystoscopy and "feels good".  catheter has been removed and he awaits voiding.  Patient is disappointed that he did not meet eligibility for CIR.  He anticipates dc to [facility] in the next few days for rehabilitation.  He hopes to return to baseline and return home.  Recommend OP community based palliative services at facility  Son is on the phone as we discuss current medical situation.  Discussed with patient/son the importance of continued conversation with each other and the medical providers regarding overall plan of care and treatment options,  ensuring decisions are within the context of the patients values and GOCs.  Questions and concerns addressed     Total time spent on the unit was 25 minutes  Greater than 50% of the time was spent in counseling and coordination of care  Wadie Lessen NP  Palliative Medicine Team Team Phone # [phone] Pager [phone]

## 2019-03-11 ENCOUNTER — Encounter (HOSPITAL_COMMUNITY): Payer: Self-pay

## 2019-03-11 LAB — CBC
HCT: 24.7 % — ABNORMAL LOW (ref 39.0–52.0)
Hemoglobin: 7.9 g/dL — ABNORMAL LOW (ref 13.0–17.0)
MCH: 29.7 pg (ref 26.0–34.0)
MCHC: 32 g/dL (ref 30.0–36.0)
MCV: 92.9 fL (ref 80.0–100.0)
Platelets: 223 10*3/uL (ref 150–400)
RBC: 2.66 MIL/uL — ABNORMAL LOW (ref 4.22–5.81)
RDW: 14.6 % (ref 11.5–15.5)
WBC: 7.8 10*3/uL (ref 4.0–10.5)
nRBC: 0 % (ref 0.0–0.2)

## 2019-03-11 LAB — BASIC METABOLIC PANEL
Anion gap: 10 (ref 5–15)
BUN: 15 mg/dL (ref 8–23)
CO2: 25 mmol/L (ref 22–32)
Calcium: 8.2 mg/dL — ABNORMAL LOW (ref 8.9–10.3)
Chloride: 103 mmol/L (ref 98–111)
Creatinine, Ser: 1.21 mg/dL (ref 0.61–1.24)
GFR calc Af Amer: >60 mL/min (ref >60)
GFR calc non Af Amer: 55 mL/min — ABNORMAL LOW (ref >60)
Glucose, Bld: 120 mg/dL — ABNORMAL HIGH (ref 70–99)
Potassium: 3.9 mmol/L (ref 3.5–5.1)
Sodium: 138 mmol/L (ref 135–145)

## 2019-03-11 MED ORDER — APIXABAN 5 MG PO TABS
5.0000 mg | ORAL_TABLET | Freq: Two times a day (BID) | ORAL | Status: DC
  Administered 2019-03-11 – 2019-03-12 (×3): 5 mg via ORAL
  Filled 2019-03-11 (×3): qty 1

## 2019-03-11 NOTE — Progress Notes (Signed)
Urology Progress Note   2 Days Post-Op s/p Clot evacuation Subjective: Clot evacuation yesterday, urine completely clear this morning  Objective: Vital signs in last 24 hours: Temp:  [97.9 F (36.6 C)-99.3 F (37.4 C)] 97.9 F (36.6 C) (02/19 0515) Pulse Rate:  [61-75] 69 (02/19 0515) Resp:  [16-18] 16 (02/19 0515) BP: (124-132)/(50-54) 132/54 (02/19 0515) SpO2:  [96 %-97 %] 97 % (02/19 0515) Weight:  [124.3 kg] 124.3 kg (02/19 0424)  Intake/Output from previous day: 02/18 0701 - 02/19 0700 In: 710 [P.O.:700; I.V.:10] Out: 1000 [Urine:1000] Intake/Output this shift: No intake/output data recorded.  Physical Exam:  General: Alert and oriented CV: RRR Lungs: Normal work of breathing Abdomen: Soft GU: 16 French two-way catheter with clear yellow urine Ext: NT, No erythema  Lab Results: Recent Labs    03/09/19 0429 03/10/19 0336 03/11/19 0400  HGB 7.9* 8.4* 7.9*  HCT 24.1* 25.9* 24.7*   BMET Recent Labs    03/10/19 0336 03/11/19 0400  NA 136 138  K 4.3 3.9  CL 102 103  CO2 24 25  GLUCOSE 143* 120*  BUN 10 15  CREATININE 1.09 1.21  CALCIUM 8.2* 8.2*    Studies/Results: No results found.  Assessment/Plan:  84 y.o. male with hematuria, likely due to catheter trauma.  Evacuated large clot in the operating room on 2/17.  Now urine clear.   Unfortunately he failed a trial of void last night with 1 L of output upon placement of catheter.  Urine remains clear.  -Continue Flomax and finasteride  -Leave Foley catheter in place until 03/18/2019 to allow the bladder to heal.  Then okay for trial of void.  He will likely be in a nursing home at this time so please include in the discharge instructions that it is okay for the nursing home to perform a trial of void.  If he does not urinate replaced with a 16 French Foley catheter.  -We will arrange urology follow-up in the next several weeks  -Okay to discharge from urology perspective  Dispo: Floor   LOS: 28  days   Tharon Aquas 03/11/2019, 7:36 AM

## 2019-03-11 NOTE — Progress Notes (Signed)
PROGRESS NOTE    Derek Foster  [id number] DOB: 1935/08/16 DOA: 02/11/2019 PCP: Celene Squibb, MD   Brief Narrative:  84 year old male with history of CAD status post stent in 2011, hypertension, hyperlipidemia, GERD, hypothyroidism was admitted 02/11/2019 for abdominal pain, nausea, vomiting with generalized weakness since his recent Covid diagnosis on 01/20/2020 (patient's wife passed away from COVID-43 2 weeks prior to presentation).  Work-up revealed SBO treated conservatively with NG tube and bowel rest.  Course complicated by A. fib with RVR treated with amiodarone, Cardizem, metoprolol and digoxin.  Course also complicated by gross hematuria not responsive to CBI, requiring clot evacuation by cystoscopy on 03/09/2019.  Eliquis has been held.  Patient failed voiding trial and Foley was replaced last night.  Eliquis is restarted today.  Subjective:  Patient is somewhat frustrated that the Foley had to be replaced yesterday because he failed his voiding trial.  Patient understands that he does need the Foley.  Hopes he does not bleed again.  No other acute complaints.  Assessment & Plan:   Active Problems:   Mixed hyperlipidemia   Essential hypertension   CAD (coronary artery disease)   Atrial fibrillation with RVR (HCC)   AKI (acute kidney injury) ([place])   SBO (small bowel obstruction) ([place])   Gross hematuria   Palliative care by specialist   DNR (do not resuscitate)   Weakness generalized   Grief  Hematuria secondary to Foley trauma in setting of very large prostate and anticoagulation. Started with anticoagulation, not responsive to CBI. Status post cystoscopy clot evacuation 03/09/2019. Hemoglobin decreased from 8.4 to 7.9 and initial tachycardia has now resolved. Patient failed voiding trial and Foley was replaced 03/11/2019. Discussed with urology who note there was Foley trauma given large prostate but that it was okay to restart Eliquis.  Eliquis restarted today. Observe  for bleeding overnight, repeat H&H in the morning, follow hemodynamic parameters overnight. If no further bleeding, patient to be discharged to SNF with Foley in place. Continue Flomax and Proscar.  Acute kidney injury Creatinine is a little bit labile specially given multiple shifts with SBO and then subsequent hematuria. Recheck in the morning.  UTI   Completed course of antibiotics with Rocephin for Proteus and Klebsiella in urine.  New onset paroxysmal atrial fibrillation/flutter with RVR.   Currently rate controlled on amiodarone, Cardizem metoprolol and digoxin. Eliquis restarted today. Preserved EF on echo.  Hyperthyroidism TSH/T4 consistent with hyperthyroidism.  Methimazole dose has been titrated to 20 mg daily.   Outpatient follow-up with PCP/endocrinology  Small bowel obstruction Resolved status post conservative treatment with NG tube placement and bowel rest. Tolerating diet at present. Thought to be likely to additions from prior surgeries and ventral hernia.  Recent COVID-19 infection Patient reports being diagnosed on Dec 31, 2024at his PCP Dr. Josue Hector office completed 3-week isolation period on 1/21, repeat Covid PCR was positive on 1/22, suspect this is residual viral fragments, I called Dr. Josue Hector office and was able to confirm original positive Covid diagnosis on January 20, 2023 Did have generalized weakness secondary to this without respiratory symptoms Off isolation  History of CAD No symptoms of acute decompensation, denies chest pain or shortness of breath. Remote history of stents  Hypertension Continue Cardizem and metoprolol.  BP stable  GERD Continue oral PPI.  Generalized deconditioning Patient has a bed for SNF placement in the morning given significant generalized debility. Palliative care evaluation has been requested for goals of care discussion.   Objective: Vitals:   03/10/19 2053  03/11/19 0424 03/11/19 0515 03/11/19 1500  BP: (!)  132/54  (!) 132/54 (P) 135/64  Pulse: 65  69 (P) 74  Resp:   16 (P) 17  Temp:   97.9 F (36.6 C) (P) 97.6 F (36.4 C)  TempSrc:   Oral (P) Oral  SpO2:   97% (P) 100%  Weight:  124.3 kg    Height:        Intake/Output Summary (Last 24 hours) at 03/11/2019 1725 Last data filed at 03/11/2019 1047 Gross per 24 hour  Intake 470 ml  Output 1900 ml  Net -1430 ml   Filed Weights   03/09/19 1605 03/10/19 0500 03/11/19 0424  Weight: 120.6 kg 124.4 kg 124.3 kg    Examination:  General exam: Somewhat tired appearing man lying in bed in no acute distress.  He seems somewhat dejected. Respiratory system: Clear to auscultation. Respiratory effort normal. Cardiovascular system: S1 & S2 heard, RRR. Gastrointestinal system: NABS, soft and nontender. Central nervous system: Alert and oriented.  Grossly nonfocal  extremities: No edema, no cyanosis, pulses intact and symmetrical. Skin: No rashes, lesions or ulcers Psychiatry: Judgement and insight appear normal. Mood & affect are somewhat depressed   DVT prophylaxis: Eliquis restarted today Code Status: DNR Family Communication: No family at bedside Disposition Plan: For discharge tomorrow as long as H&H are stable and no further hematuria.  Consultants:   Urology  Cardiology  General surgery  Procedures:  Antimicrobials:   Data Reviewed: I have personally reviewed following labs and imaging studies  CBC: Recent Labs  Lab 03/07/19 0247 03/08/19 0421 03/09/19 0429 03/10/19 0336 03/11/19 0400  WBC 7.7 6.8 6.7 6.9 7.8  NEUTROABS  --  3.8 4.8  --   --   HGB 7.9* 8.3* 7.9* 8.4* 7.9*  HCT 24.5* 25.1* 24.1* 25.9* 24.7*  MCV 92.1 91.9 91.6 92.2 92.9  PLT 235 227 217 230 [id number]   Basic Metabolic Panel: Recent Labs  Lab 03/07/19 0247 03/08/19 0421 03/09/19 0429 03/10/19 0336 03/11/19 0400  NA 135 136 137 136 138  K 3.9 4.1 4.0 4.3 3.9  CL 103 101 103 102 103  CO2 24 25 24 24 25   GLUCOSE 109* 116* 105* 143* 120*  BUN 13  11 12 10 15   CREATININE 1.29* 1.25* 1.46* 1.09 1.21  CALCIUM 7.9* 8.2* 8.1* 8.2* 8.2*  MG  --  1.8 1.8  --   --    GFR: Estimated Creatinine Clearance: 65 mL/min (by C-G formula based on SCr of 1.21 mg/dL). Liver Function Tests: No results for input(s): AST, ALT, ALKPHOS, BILITOT, PROT, ALBUMIN in the last 168 hours. No results for input(s): LIPASE, AMYLASE in the last 168 hours. No results for input(s): AMMONIA in the last 168 hours. Coagulation Profile: No results for input(s): INR, PROTIME in the last 168 hours. Cardiac Enzymes: No results for input(s): CKTOTAL, CKMB, CKMBINDEX, TROPONINI in the last 168 hours. BNP (last 3 results) No results for input(s): PROBNP in the last 8760 hours. HbA1C: No results for input(s): HGBA1C in the last 72 hours. CBG: No results for input(s): GLUCAP in the last 168 hours. Lipid Profile: No results for input(s): CHOL, HDL, LDLCALC, TRIG, CHOLHDL, LDLDIRECT in the last 72 hours. Thyroid Function Tests: No results for input(s): TSH, T4TOTAL, FREET4, T3FREE, THYROIDAB in the last 72 hours. Anemia Panel: Recent Labs    03/10/19 1203  FERRITIN 199  TIBC 206*  IRON 46   Sepsis Labs: No results for input(s): PROCALCITON, LATICACIDVEN in the last  168 hours.  Recent Results (from the past 240 hour(s))  Surgical pcr screen     Status: None   Collection Time: 03/09/19  3:24 PM   Specimen: Nasal Mucosa; Nasal Swab  Result Value Ref Range Status   MRSA, PCR NEGATIVE NEGATIVE Final   Staphylococcus aureus NEGATIVE NEGATIVE Final    Comment: (NOTE) The Xpert SA Assay (FDA approved for NASAL specimens in patients 71 years of age and older), is one component of a comprehensive surveillance program. It is not intended to diagnose infection nor to guide or monitor treatment. Performed at [facility], [place] [address], [place], [place] [address]      Radiology Studies: No results found.  Scheduled Meds: . amiodarone  200 mg Oral BID  .  apixaban  5 mg Oral BID  . Chlorhexidine Gluconate Cloth  6 each Topical Daily  . digoxin  0.125 mg Oral Daily  . diltiazem  360 mg Oral Daily  . feeding supplement  1 Container Oral BID BM  . feeding supplement (ENSURE ENLIVE)  237 mL Oral Q1500  . finasteride  5 mg Oral Daily  . methimazole  20 mg Oral TID  . metoprolol tartrate  200 mg Oral BID  . multivitamin with minerals  1 tablet Oral Daily  . opium-belladonna  1 suppository Rectal Once  . pantoprazole  40 mg Oral BID  . pravastatin  10 mg Oral q1800  . senna-docusate  1 tablet Oral BID  . sodium chloride flush  10-40 mL Intracatheter Q12H  . tamsulosin  0.4 mg Oral QPC breakfast   Continuous Infusions:    LOS: 28 days   Time spent: 35 minutes  Dewaine Oats Derek Jack, MD [facility]  If 7PM-7AM, please contact night-coverage [email or web address]  03/11/2019, 5:25 PM   This record has been created using Systems analyst. Errors have been sought and corrected,but may not always be located. Such creation errors do not reflect on the standard of care.

## 2019-03-11 NOTE — TOC Progression Note (Signed)
Transition of Care [facility]) - Progression Note    Patient Details  Name: Derek Foster MRN: [id number] Date of Birth: Nov 20, 1935  Transition of Care [facility]) CM/SW [place], [place] Phone Number: 03/11/2019, 1:36 PM  Clinical Narrative:    CSW received authorization for pt to transfer to [place] when medically appropriate. At this time per MD pt will need to restart anticoagulant and be monitored, possible dc tomorrow. CSW has updated Clinical biochemist at [facility].   Approval [phone] good for 7 days, must transfer within 5 days.    Expected Discharge Plan: Skilled Nursing Facility Barriers to Discharge: Continued Medical Work up  Expected Discharge Plan and Services Expected Discharge Plan: [place] In-house Referral: Clinical Social Work Living arrangements for the past 2 months: Single Family Home Expected Discharge Date: 02/26/19                Readmission Risk Interventions Readmission Risk Prevention Plan 03/11/2019  Transportation Screening Complete  PCP or Specialist Appt within 3-5 Days Not Complete  Not Complete comments plan for SNF  HRI or [place] Not Complete  HRI or Home Care Consult comments plan for SNF  Social Work Consult for [place] Planning/Counseling Complete  Palliative Care Screening Complete  Medication Review (RN Care Manager) Referral to Pharmacy  Some recent data might be hidden

## 2019-03-11 NOTE — Progress Notes (Signed)
ANTICOAGULATION CONSULT NOTE - Initial Consult  Pharmacy Consult for apixaban Indication: atrial fibrillation  Allergies  Allergen Reactions  . Indomethacin Anaphylaxis    But tolerates ibuprofen, aleve  . Iodine-131 Anaphylaxis    Patient Measurements: Height: 6' 0.99" (185.4 cm) Weight: 274 lb 0.5 oz (124.3 kg) IBW/kg (Calculated) : 79.88  Vital Signs: Temp: 97.9 F (36.6 C) (02/19 0515) Temp Source: Oral (02/19 0515) BP: 132/54 (02/19 0515) Pulse Rate: 69 (02/19 0515)  Labs: Recent Labs    03/09/19 0429 03/09/19 0429 03/10/19 0336 03/11/19 0400  HGB 7.9*   < > 8.4* 7.9*  HCT 24.1*  --  25.9* 24.7*  PLT 217  --  230 223  CREATININE 1.46*  --  1.09 1.21   < > = values in this interval not displayed.    Estimated Creatinine Clearance: 65 mL/min (by C-G formula based on SCr of 1.21 mg/dL).  Assessment: 34 yom with new afib to restart apixaban today. It had been on hold due to hematuria which has resolved. Hgb remains low but stable and platelets are WNL.   Goal of Therapy:  Stroke prevention Monitor platelets by anticoagulation protocol: Yes   Plan:  Apixaban 5mg  PO BID F/u S&S of bleeding  Derek Foster, Rande Lawman 03/11/2019,11:19 AM

## 2019-03-11 NOTE — Progress Notes (Signed)
Paged Dr. Silas [place] to inform that pt's foley was removed on 2/18 and pt has only voided 110 ml since removal. Bladder scan shows 525 ml. 03/10/2019 0018 Cyndi Bender, RN

## 2019-03-11 NOTE — Progress Notes (Signed)
Urology MD rounded on patient this am. Stated to leave the foley in place. He will touch base with the patient later this am, he didn't wish to wake him at this time. Pt can be discharged with foley and follow up with urology in about a week as he will need to leave the catheter in place for at least a week in order for the bladder to heal. 03/11/2019 [id number] Cyndi Bender, RN

## 2019-03-11 NOTE — Progress Notes (Addendum)
Bladder scan showed 525 ml of urine, order for In and Out cath obtained from [facility] NP, foley catheter 16 french inserted due to h/o hematuria from catheter trauma and s/p cystoscopy with clot evaluation and per Urology note, RN may attempt to insert foley if needed.Urine output obtained 1000 ml from urinary bag.

## 2019-03-11 NOTE — Progress Notes (Signed)
Physical Therapy Treatment Patient Details Name: Derek Foster MRN: [id number] DOB: 04/10/1935 Today's Date: 03/11/2019    History of Present Illness 84 year old presented with abdominal pain, nausea, vomiting with generalized weakness since his recent Covid diagnosis on 2020/01/20 (patient's wife passed away from COVID-61 2 weeks ago).Pt with Afib, small bowel obstruction, and bladder outlet obstruction. PMHx: CAD, HTN, HLD, GERD, hypothyroidism.    PT Comments    Pt willing to progress mobility this session with decreased assist for gait needed. Pt continues to fatigue quickly and only able to ambulate in room. Pt educated for bil LE HEP as well as encouraged to perform repeated sit<>stand to strengthening and endurance. Pt overall reports frustration with entire covid illness, continued weakness and loss of wife. Will continue to follow and recommend OOB for meals with nursing staff.     Follow Up Recommendations  SNF;Supervision/Assistance - 24 hour     Equipment Recommendations  None recommended by PT    Recommendations for Other Services       Precautions / Restrictions Precautions Precautions: Fall    Mobility  Bed Mobility   Bed Mobility: Supine to Sit     Supine to sit: Min guard;HOB elevated     General bed mobility comments: HOB 30 degrees with increased time  Transfers Overall transfer level: Needs assistance   Transfers: Sit to/from Stand Sit to Stand: Min assist         General transfer comment: cues for hand placement with limited assist to rise and cues to control descent  Ambulation/Gait Ambulation/Gait assistance: Min guard Gait Distance (Feet): 20 Feet Assistive device: Rolling walker (2 wheeled) Gait Pattern/deviations: Step-through pattern;Decreased stride length;Trunk flexed   Gait velocity interpretation: 1.31 - 2.62 ft/sec, indicative of limited community ambulator General Gait Details: cues for posture, decreased stride and pt able to  walk to door and back but fatigued quickly and required chair. Pt unable to attempt additional trials due to fatigue   Stairs             Wheelchair Mobility    Modified Rankin (Stroke Patients Only)       Balance Overall balance assessment: Needs assistance Sitting-balance support: Feet supported;No upper extremity supported Sitting balance-Leahy Scale: Good     Standing balance support: Bilateral upper extremity supported;During functional activity Standing balance-Leahy Scale: Poor Standing balance comment: heavy reliance on UE support from RW                            Cognition Arousal/Alertness: Awake/alert Behavior During Therapy: WFL for tasks assessed/performed Overall Cognitive Status: Within Functional Limits for tasks assessed                                        Exercises General Exercises - Lower Extremity Long Arc Quad: AROM;Both;Seated;10 reps Hip Flexion/Marching: Both;10 reps;AROM;Seated    General Comments        Pertinent Vitals/Pain Pain Assessment: No/denies pain    Home Living                      Prior Function            PT Goals (current goals can now be found in the care plan section) Acute Rehab PT Goals Time For Goal Achievement: 03/25/19 Potential to Achieve Goals: Fair Progress towards PT goals: Progressing toward  goals(goals remain appropriate with date updated)    Frequency           PT Plan Current plan remains appropriate    Co-evaluation              AM-PAC PT "6 Clicks" Mobility   Outcome Measure  Help needed turning from your back to your side while in a flat bed without using bedrails?: A Little Help needed moving from lying on your back to sitting on the side of a flat bed without using bedrails?: A Little Help needed moving to and from a bed to a chair (including a wheelchair)?: A Little Help needed standing up from a chair using your arms (e.g.,  wheelchair or bedside chair)?: A Little Help needed to walk in hospital room?: A Little Help needed climbing 3-5 steps with a railing? : A Lot 6 Click Score: 17    End of Session Equipment Utilized During Treatment: Gait belt Activity Tolerance: Patient limited by fatigue Patient left: in chair;with call bell/phone within reach;with chair alarm set Nurse Communication: Mobility status PT Visit Diagnosis: Other abnormalities of gait and mobility (R26.89);Difficulty in walking, not elsewhere classified (R26.2);Muscle weakness (generalized) (M62.81)     Time: [id number] PT Time Calculation (min) (ACUTE ONLY): 22 min  Charges:  $Gait Training: 8-22 mins                     Bayard Males, PT Acute Rehabilitation Services Pager: [phone] Office: [place] 03/11/2019, 12:28 PM

## 2019-03-12 ENCOUNTER — Inpatient Hospital Stay
Admission: RE | Admit: 2019-03-12 | Discharge: 2019-04-01 | Disposition: A | Discharge status: Home / Self Care | Payer: PPO | Source: Ambulatory Visit | Attending: Internal Medicine | Admitting: Internal Medicine

## 2019-03-12 DIAGNOSIS — K56600 Partial intestinal obstruction, unspecified as to cause: Secondary | ICD-10-CM | POA: Diagnosis not present

## 2019-03-12 DIAGNOSIS — Z8616 Personal history of COVID-19: Secondary | ICD-10-CM | POA: Diagnosis not present

## 2019-03-12 DIAGNOSIS — I480 Paroxysmal atrial fibrillation: Secondary | ICD-10-CM | POA: Diagnosis not present

## 2019-03-12 DIAGNOSIS — N4289 Other specified disorders of prostate: Secondary | ICD-10-CM | POA: Diagnosis not present

## 2019-03-12 DIAGNOSIS — K56609 Unspecified intestinal obstruction, unspecified as to partial versus complete obstruction: Secondary | ICD-10-CM | POA: Diagnosis not present

## 2019-03-12 DIAGNOSIS — K5900 Constipation, unspecified: Secondary | ICD-10-CM | POA: Diagnosis not present

## 2019-03-12 DIAGNOSIS — B9689 Other specified bacterial agents as the cause of diseases classified elsewhere: Secondary | ICD-10-CM | POA: Diagnosis not present

## 2019-03-12 DIAGNOSIS — I10 Essential (primary) hypertension: Secondary | ICD-10-CM | POA: Diagnosis not present

## 2019-03-12 DIAGNOSIS — Z95828 Presence of other vascular implants and grafts: Secondary | ICD-10-CM | POA: Diagnosis not present

## 2019-03-12 DIAGNOSIS — E785 Hyperlipidemia, unspecified: Secondary | ICD-10-CM | POA: Diagnosis not present

## 2019-03-12 DIAGNOSIS — E0590 Thyrotoxicosis, unspecified without thyrotoxic crisis or storm: Secondary | ICD-10-CM | POA: Diagnosis not present

## 2019-03-12 DIAGNOSIS — I959 Hypotension, unspecified: Secondary | ICD-10-CM | POA: Diagnosis not present

## 2019-03-12 DIAGNOSIS — I4819 Other persistent atrial fibrillation: Secondary | ICD-10-CM | POA: Diagnosis not present

## 2019-03-12 DIAGNOSIS — R531 Weakness: Secondary | ICD-10-CM | POA: Diagnosis not present

## 2019-03-12 DIAGNOSIS — E782 Mixed hyperlipidemia: Secondary | ICD-10-CM | POA: Diagnosis not present

## 2019-03-12 DIAGNOSIS — N3090 Cystitis, unspecified without hematuria: Secondary | ICD-10-CM | POA: Diagnosis not present

## 2019-03-12 DIAGNOSIS — Z7401 Bed confinement status: Secondary | ICD-10-CM | POA: Diagnosis not present

## 2019-03-12 DIAGNOSIS — Z978 Presence of other specified devices: Secondary | ICD-10-CM | POA: Diagnosis not present

## 2019-03-12 DIAGNOSIS — F4329 Adjustment disorder with other symptoms: Secondary | ICD-10-CM | POA: Diagnosis not present

## 2019-03-12 DIAGNOSIS — E43 Unspecified severe protein-calorie malnutrition: Secondary | ICD-10-CM | POA: Diagnosis not present

## 2019-03-12 DIAGNOSIS — R338 Other retention of urine: Secondary | ICD-10-CM | POA: Diagnosis not present

## 2019-03-12 DIAGNOSIS — I2510 Atherosclerotic heart disease of native coronary artery without angina pectoris: Secondary | ICD-10-CM | POA: Diagnosis not present

## 2019-03-12 DIAGNOSIS — N400 Enlarged prostate without lower urinary tract symptoms: Secondary | ICD-10-CM | POA: Diagnosis not present

## 2019-03-12 DIAGNOSIS — N3091 Cystitis, unspecified with hematuria: Secondary | ICD-10-CM | POA: Diagnosis not present

## 2019-03-12 DIAGNOSIS — N390 Urinary tract infection, site not specified: Secondary | ICD-10-CM | POA: Diagnosis not present

## 2019-03-12 DIAGNOSIS — B964 Proteus (mirabilis) (morganii) as the cause of diseases classified elsewhere: Secondary | ICD-10-CM | POA: Diagnosis not present

## 2019-03-12 DIAGNOSIS — K219 Gastro-esophageal reflux disease without esophagitis: Secondary | ICD-10-CM | POA: Diagnosis not present

## 2019-03-12 DIAGNOSIS — Z7901 Long term (current) use of anticoagulants: Secondary | ICD-10-CM | POA: Diagnosis not present

## 2019-03-12 DIAGNOSIS — N179 Acute kidney failure, unspecified: Secondary | ICD-10-CM | POA: Diagnosis not present

## 2019-03-12 DIAGNOSIS — E0789 Other specified disorders of thyroid: Secondary | ICD-10-CM | POA: Diagnosis not present

## 2019-03-12 DIAGNOSIS — R310 Gross hematuria: Secondary | ICD-10-CM | POA: Diagnosis not present

## 2019-03-12 DIAGNOSIS — M2550 Pain in unspecified joint: Secondary | ICD-10-CM | POA: Diagnosis not present

## 2019-03-12 DIAGNOSIS — R262 Difficulty in walking, not elsewhere classified: Secondary | ICD-10-CM | POA: Diagnosis not present

## 2019-03-12 DIAGNOSIS — Z741 Need for assistance with personal care: Secondary | ICD-10-CM | POA: Diagnosis not present

## 2019-03-12 DIAGNOSIS — I25118 Atherosclerotic heart disease of native coronary artery with other forms of angina pectoris: Secondary | ICD-10-CM | POA: Diagnosis not present

## 2019-03-12 DIAGNOSIS — Z48816 Encounter for surgical aftercare following surgery on the genitourinary system: Secondary | ICD-10-CM | POA: Diagnosis not present

## 2019-03-12 DIAGNOSIS — I4891 Unspecified atrial fibrillation: Secondary | ICD-10-CM | POA: Diagnosis not present

## 2019-03-12 DIAGNOSIS — M6281 Muscle weakness (generalized): Secondary | ICD-10-CM | POA: Diagnosis not present

## 2019-03-12 LAB — CBC
HCT: 24.9 % — ABNORMAL LOW (ref 39.0–52.0)
Hemoglobin: 7.9 g/dL — ABNORMAL LOW (ref 13.0–17.0)
MCH: 29.5 pg (ref 26.0–34.0)
MCHC: 31.7 g/dL (ref 30.0–36.0)
MCV: 92.9 fL (ref 80.0–100.0)
Platelets: 218 10*3/uL (ref 150–400)
RBC: 2.68 MIL/uL — ABNORMAL LOW (ref 4.22–5.81)
RDW: 14.9 % (ref 11.5–15.5)
WBC: 7.3 10*3/uL (ref 4.0–10.5)
nRBC: 0 % (ref 0.0–0.2)

## 2019-03-12 LAB — BASIC METABOLIC PANEL
Anion gap: 9 (ref 5–15)
BUN: 20 mg/dL (ref 8–23)
CO2: 25 mmol/L (ref 22–32)
Calcium: 8.1 mg/dL — ABNORMAL LOW (ref 8.9–10.3)
Chloride: 104 mmol/L (ref 98–111)
Creatinine, Ser: 1.2 mg/dL (ref 0.61–1.24)
GFR calc Af Amer: >60 mL/min (ref >60)
GFR calc non Af Amer: 56 mL/min — ABNORMAL LOW (ref >60)
Glucose, Bld: 100 mg/dL — ABNORMAL HIGH (ref 70–99)
Potassium: 3.7 mmol/L (ref 3.5–5.1)
Sodium: 138 mmol/L (ref 135–145)

## 2019-03-12 MED ORDER — ENSURE ENLIVE PO LIQD: 237.0000 mL | Freq: Every day | ORAL | 12 refills | Status: DC

## 2019-03-12 MED ORDER — METHIMAZOLE 10 MG PO TABS: 20.0000 mg | ORAL_TABLET | Freq: Three times a day (TID) | ORAL | Status: DC

## 2019-03-12 MED ORDER — DIGOXIN 125 MCG PO TABS: 0.1250 mg | ORAL_TABLET | Freq: Every day | ORAL | Status: DC

## 2019-03-12 MED ORDER — APIXABAN 5 MG PO TABS: 5.0000 mg | ORAL_TABLET | Freq: Two times a day (BID) | ORAL | Status: DC

## 2019-03-12 MED ORDER — POLYETHYLENE GLYCOL 3350 17 G PO PACK: 17.0000 g | PACK | Freq: Every day | ORAL | 0 refills | Status: DC | PRN

## 2019-03-12 MED ORDER — PANTOPRAZOLE SODIUM 40 MG PO TBEC: 40.0000 mg | DELAYED_RELEASE_TABLET | Freq: Two times a day (BID) | ORAL | Status: DC

## 2019-03-12 MED ORDER — BELLADONNA ALKALOIDS-OPIUM 16.2-60 MG RE SUPP: 1.0000 | Freq: Once | RECTAL | 0 refills | Status: DC

## 2019-03-12 MED ORDER — BISACODYL 10 MG RE SUPP: 10.0000 mg | Freq: Every day | RECTAL | 0 refills | Status: DC | PRN

## 2019-03-12 MED ORDER — METOPROLOL TARTRATE 100 MG PO TABS: 200.0000 mg | ORAL_TABLET | Freq: Two times a day (BID) | ORAL | Status: DC

## 2019-03-12 MED ORDER — DILTIAZEM HCL ER COATED BEADS 360 MG PO CP24: 360.0000 mg | ORAL_CAPSULE | Freq: Every day | ORAL | Status: DC

## 2019-03-12 MED ORDER — SENNOSIDES-DOCUSATE SODIUM 8.6-50 MG PO TABS: 1.0000 | ORAL_TABLET | Freq: Two times a day (BID) | ORAL | Status: DC

## 2019-03-12 MED ORDER — ONDANSETRON HCL 4 MG/2ML IJ SOLN: 4.0000 mg | Freq: Four times a day (QID) | INTRAMUSCULAR | 0 refills | Status: DC | PRN

## 2019-03-12 MED ORDER — AMIODARONE HCL 200 MG PO TABS: 200.0000 mg | ORAL_TABLET | Freq: Two times a day (BID) | ORAL | Status: DC

## 2019-03-12 MED ORDER — TAMSULOSIN HCL 0.4 MG PO CAPS: 0.4000 mg | ORAL_CAPSULE | Freq: Every day | ORAL | Status: DC

## 2019-03-12 MED ORDER — PRAVASTATIN SODIUM 10 MG PO TABS: 10.0000 mg | ORAL_TABLET | Freq: Every day | ORAL | Status: DC

## 2019-03-12 MED ORDER — CHLORHEXIDINE GLUCONATE CLOTH 2 % EX PADS: 6.0000 | MEDICATED_PAD | Freq: Every day | CUTANEOUS | Status: DC

## 2019-03-12 MED ORDER — FINASTERIDE 5 MG PO TABS: 5.0000 mg | ORAL_TABLET | Freq: Every day | ORAL | Status: DC

## 2019-03-12 NOTE — TOC Transition Note (Signed)
Transition of Care [facility]) - CM/SW Discharge Note   Patient Details  Name: LANDRIC GUMZ MRN: [id number] Date of Birth: 1935-09-09  [facility] CM/SW Contact:  Alexander Mt, LCSW Phone Number: 03/12/2019, 10:57 AM   Clinical Narrative:    CSW spoke with pt via telephone. Confirmed dc today. Pt aware and has spoken with his son. PTAR papers on chart, pt DNR has been signed as confirmed by bedside RN Joann, no controlled medications noted.   PTAR called, number on chart for report.   Final next level of care: Skilled Nursing Facility Barriers to Discharge: Barriers Resolved   Patient Goals and CMS Choice Patient states their goals for this hospitalization and ongoing recovery are:: To get strong CMS Medicare.gov Compare Post Acute Care list provided to:: Patient Choice offered to / list presented to : Patient  Discharge Placement PASRR number recieved: 02/18/19            Patient chooses bed at: [facility] Patient to be transferred to facility by: [place] Name of family member notified: pt states he has already talked to his children Patient and family notified of of transfer: 03/12/19  Discharge Plan and Services In-house Referral: Clinical Social Work   Readmission Risk Interventions Readmission Risk Prevention Plan 03/11/2019  Transportation Screening Complete  PCP or Specialist Appt within 3-5 Days Not Complete  Not Complete comments plan for SNF  HRI or [place] Not Complete  HRI or Home Care Consult comments plan for SNF  Social Work Consult for [place] Planning/Counseling Complete  Palliative Care Screening Complete  Medication Review (RN Care Manager) Referral to Pharmacy  Some recent data might be hidden

## 2019-03-12 NOTE — Progress Notes (Signed)
Report given to PTAR. PTAR transporting patient to facility.

## 2019-03-12 NOTE — Social Work (Signed)
Clinical Social Worker facilitated patient discharge including contacting patient family and facility to confirm patient discharge plans.  Clinical information faxed to facility and family agreeable with plan.  CSW arranged ambulance transport via [place] to [facility] RN to call [phone]  with report prior to discharge.  Clinical Social Worker will sign off for now as social work intervention is no longer needed. Please consult [place] again if new need arises.  Westley Hummer, MSW, LCSW Clinical Social Worker

## 2019-03-12 NOTE — Plan of Care (Signed)

## 2019-03-12 NOTE — Progress Notes (Signed)
[place] called, spoke with Caryl Asp RN and SBAR given. PTAR not yet arrived.

## 2019-03-12 NOTE — Discharge Summary (Signed)
Derek Foster [id number] DOB: [birth year] DOA: 02/11/2019  PCP: Celene Squibb, MD  Admit date: 02/11/2019  Discharge date: 03/12/2019  Admitted From: Home   disposition: SNF- rehab  Follow up with PCP in 1-2 weeks Patient will need a voiding trial in 3 weeks. Patient will need to follow-up with urology in 3 weeks if voiding trial was unsuccessful.  PCP Please follow-up on patient's thyroid function tests as he has been started on methimazole.  Consultations: Urology, cardiology, general surgery Discharge Condition: Improved CODE STATUS: DNR Diet Recommendation: Heart Healthy low-sodium  Diet Order            Diet - low sodium heart healthy        Diet regular Room service appropriate? Yes; Fluid consistency: Thin  Diet effective now               Chief Complaint  Patient presents with  . Abdominal Pain     Brief history of present illness from the day of admission and additional interim summary    84 year old male with history of CAD status post stent in 2011, hypertension, hyperlipidemia, GERD, hypothyroidism was admitted 02/11/2019 for abdominal pain, nausea, vomiting with generalized weakness since his recent Covid diagnosis on 01/19/2020 (patient's wife passed away from COVID-19 2 weeks prior to presentation).  Work-up revealed SBO treated conservatively with NG tube and bowel rest.  Course complicated by A. fib with RVR treated with amiodarone, Cardizem, metoprolol and digoxin.  Course also complicated by gross hematuria not responsive to CBI, requiring clot evacuation by cystoscopy on 03/09/2019.  Eliquis has been held.  Patient failed voiding trial and Foley was replaced last night.  Eliquis is restarted today.                                                                  Hospital Course     Hematuria secondary to Foley trauma in setting of very large prostate and anticoagulation. Hematuria onset with anticoagulation, not responsive to CBI. Status post cystoscopy clot evacuation 03/09/2019. Hemoglobin decreased from 8.4 to 7.9 and initial tachycardia has now resolved. Patient failed voiding trial and Foley was replaced 03/11/2019. Discussed with urology who note there was Foley trauma given large prostate but that it was okay to restart Eliquis.  Eliquis restarted 03/11/2019 Patient did well after starting Eliquis, H&H and hemodynamic parameters have remained stable.  No further blood noted in Foley bag with free-flowing urine. Patient will need to have Foley catheter remain in place for 3 weeks and then a trial of voiding attempted. Voiding trial is unsuccessful, patient will need to have Foley replaced. Patient will need to follow-up with urology in 3 weeks if voiding trial is unsuccessful. Continue Flomax and Proscar.  Acute kidney injury Creatinine is a little bit labile specially  given multiple shifts with SBO and then subsequent hematuria. Recheck in the morning.  UTI   Completed course of antibiotics with Rocephin for Proteus and Klebsiella in urine.  New onset paroxysmal atrial fibrillation/flutter with RVR.   Currently rate controlled on amiodarone, Cardizem metoprolol and digoxin. Continue Eliquis Preserved EF on echo.  Hyperthyroidism TSH/T4 consistent with hyperthyroidism.  Methimazole dose has been titrated to 20 mg daily.  Outpatient follow-up with PCP/endocrinology  Small bowel obstruction Resolved status post conservative treatment with NG tube placement and bowel rest. Tolerating diet at present. Thought to be likely to additions from prior surgeries and ventral hernia.  Recent COVID-19 infection Patient reports being diagnosed on 12/29at his PCP Dr. Josue Hector office completed 3-week isolation period on 1/21, repeat Covid PCR was positive  on 1/22, suspect this is residual viral fragments, I called Dr. Josue Hector office and was able to confirm original positive Covid diagnosis on 12/29 Did have generalized weakness secondary to this without respiratory symptoms Off isolation  History of CAD No symptoms of acute decompensation, denies chest pain or shortness of breath. Remote history of stents  Hypertension Continue Cardizem and metoprolol. BP stable  GERD Continue oral PPI.  Generalized deconditioning Patient has a bed for SNF placement in the morning given significant generalized debility. Palliative care evaluation has been requested for goals of care discussion.    Discharge diagnosis     Active Problems:   Mixed hyperlipidemia   Essential hypertension   CAD (coronary artery disease)   Atrial fibrillation with RVR ([facility])   AKI (acute kidney injury) ([facility])   SBO (small bowel obstruction) ([facility])   Gross hematuria   Palliative care by specialist   DNR (do not resuscitate)   Weakness generalized   Grief    Discharge instructions    Discharge Instructions    Diet - low sodium heart healthy   Complete by: As directed    Discharge instructions   Complete by: As directed    1. You will need to get your thyroid function checked in the next 3 weeks, make sure to get that done either in Rehab or at your PCP after discharge.   Increase activity slowly   Complete by: As directed       Discharge Medications   Allergies as of 03/12/2019      Reactions   Indomethacin Anaphylaxis   But tolerates ibuprofen, aleve   Iodine-131 Anaphylaxis      Medication List    STOP taking these medications   amLODipine 5 MG tablet Commonly known as: NORVASC   bisoprolol-hydrochlorothiazide 2.5-6.25 MG tablet Commonly known as: ZIAC   irbesartan 300 MG tablet Commonly known as: AVAPRO   lansoprazole 30 MG disintegrating tablet Commonly known as: PREVACID SOLUTAB   predniSONE 20 MG tablet Commonly known  as: DELTASONE   vitamin C 500 MG tablet Commonly known as: ASCORBIC ACID     TAKE these medications   amiodarone 200 MG tablet Commonly known as: PACERONE Take 1 tablet (200 mg total) by mouth 2 (two) times daily.   apixaban 5 MG Tabs tablet Commonly known as: ELIQUIS Take 1 tablet (5 mg total) by mouth 2 (two) times daily.   aspirin 81 MG tablet Take 81 mg by mouth daily.   benzonatate 100 MG capsule Commonly known as: TESSALON Take 100 mg by mouth 3 (three) times daily as needed for cough.   bisacodyl 10 MG suppository Commonly known as: DULCOLAX Place 1 suppository (10 mg total) rectally daily  as needed for severe constipation.   Chlorhexidine Gluconate Cloth 2 % Pads Apply 6 each topically daily.   Coenzyme Q10 200 MG capsule Take 200 mg by mouth daily.   diclofenac Sodium 1 % Gel Commonly known as: VOLTAREN Apply 2 g topically 4 (four) times daily as needed (for pain).   digoxin 0.125 MG tablet Commonly known as: LANOXIN Take 1 tablet (0.125 mg total) by mouth daily. Start taking on: March 13, 2019   diltiazem 360 MG 24 hr capsule Commonly known as: CARDIZEM CD Take 1 capsule (360 mg total) by mouth daily. Start taking on: March 13, 2019   feeding supplement (ENSURE ENLIVE) Liqd Take 237 mLs by mouth daily in the afternoon.   finasteride 5 MG tablet Commonly known as: PROSCAR Take 1 tablet (5 mg total) by mouth daily. Start taking on: March 13, 2019   fish oil-omega-3 fatty acids 1000 MG capsule Take 2 g by mouth daily.   methimazole 10 MG tablet Commonly known as: TAPAZOLE Take 2 tablets (20 mg total) by mouth 3 (three) times daily.   metoprolol tartrate 100 MG tablet Commonly known as: LOPRESSOR Take 2 tablets (200 mg total) by mouth 2 (two) times daily.   multivitamin tablet Take 1 tablet by mouth daily.   nitroGLYCERIN 0.4 MG SL tablet Commonly known as: NITROSTAT Place 1 tablet (0.4 mg total) under the tongue every 5 (five)  minutes as needed.   ondansetron 4 MG/2ML Soln injection Commonly known as: ZOFRAN Inject 2 mLs (4 mg total) into the vein every 6 (six) hours as needed for nausea or vomiting.   opium-belladonna 16.2-60 MG suppository Commonly known as: B&O SUPPRETTES Place 1 suppository rectally once for 1 dose.   pantoprazole 40 MG tablet Commonly known as: PROTONIX Take 1 tablet (40 mg total) by mouth 2 (two) times daily.   polyethylene glycol 17 g packet Commonly known as: MIRALAX / GLYCOLAX Take 17 g by mouth daily as needed for mild constipation.   pravastatin 10 MG tablet Commonly known as: Pravachol Take 1 tablet (10 mg total) by mouth every evening. What changed: Another medication with the same name was added. Make sure you understand how and when to take each.   pravastatin 10 MG tablet Commonly known as: PRAVACHOL Take 1 tablet (10 mg total) by mouth daily at 6 PM. What changed: You were already taking a medication with the same name, and this prescription was added. Make sure you understand how and when to take each.   promethazine 25 MG tablet Commonly known as: PHENERGAN Take 25 mg by mouth every 6 (six) hours as needed for nausea or vomiting.   senna-docusate 8.6-50 MG tablet Commonly known as: Senokot-S Take 1 tablet by mouth 2 (two) times daily.   tamsulosin 0.4 MG Caps capsule Commonly known as: FLOMAX Take 1 capsule (0.4 mg total) by mouth daily after breakfast. Start taking on: March 13, 2019        Contact information for follow-up providers    Skeet Latch, MD Follow up on 03/16/2019.   Specialty: Cardiology Why: Please go to hospital follow-up Febraury 24th at 9:20 AM Contact information: [address] [place] [address] [phone]        Celene Squibb, MD. Schedule an appointment as soon as possible for a visit in 1 week(s).   Specialty: Internal Medicine Contact information: [address] [place]  [address] [phone]        Alexis Frock, MD.  Schedule an appointment as soon as possible for a visit in 1 week(s).   Specialty: Urology Why: hematuria Contact information: [place] [address] [phone]            Contact information for after-discharge care    [facility] .   Service: Skilled Nursing Contact information: [address] [place] [phone]                  Major procedures and Radiology Reports - PLEASE review detailed and final reports thoroughly  -        CT ABDOMEN PELVIS WO CONTRAST  Addendum Date: 02/12/2019   ADDENDUM REPORT: 02/12/2019 19:31 ADDENDUM: Results were discussed with Dr. Bobbye Morton at 7:28 p.m. [place] on February 12, 2019. Electronically Signed   By: Virgina Norfolk M.D.   On: 02/12/2019 19:31   Result Date: 02/12/2019 CLINICAL DATA:  Abdominal distension. EXAM: CT ABDOMEN AND PELVIS WITHOUT CONTRAST TECHNIQUE: Multidetector CT imaging of the abdomen and pelvis was performed following the standard protocol without IV contrast. COMPARISON:  None. FINDINGS: Lower chest: Moderate severity atelectasis and/or infiltrate is seen along the posterolateral aspect of the left lung base. A small adjacent left pleural effusion is seen. Hepatobiliary: No focal liver abnormality is seen. Status post cholecystectomy. No biliary dilatation. Pancreas: Unremarkable. No pancreatic ductal dilatation or surrounding inflammatory changes. Spleen: Normal in size without focal abnormality. Adrenals/Urinary Tract: Adrenal glands are unremarkable. Kidneys are normal, without renal calculi or hydronephrosis. Multiple small bilateral parapelvic renal cysts are seen. Bladder is unremarkable. Stomach/Bowel: A nasogastric tube is seen with its distal tip noted within the body of the stomach. The appendix is not identified. Multiple dilated opacified small bowel loops are seen  within the lower abdomen and pelvis (maximum small bowel diameter of approximately 3.8 cm). These dilated bowel loops extend into a fat containing ventral hernia (see below). Decompressed small bowel loops are seen exiting this ventral hernia. Vascular/Lymphatic: Marked severity aortic atherosclerosis. No enlarged abdominal or pelvic lymph nodes. Reproductive: The prostate gland is mildly enlarged. Other: A 7.8 cm x 2.8 cm fat containing ventral hernia is seen along the anterior aspect of the mid abdominal wall, to the left of midline. An additional 9.9 cm x 4.6 cm ventral hernia is seen within the region above the umbilicus, to the left of midline. This area of herniation contains fat and a small segment of small bowel. Musculoskeletal: Multilevel degenerative changes seen throughout the lumbar spine IMPRESSION: 1. Partial distal small-bowel obstruction secondary to the presence of a 9.9 cm x 4.6 cm ventral hernia seen just above the level of the umbilicus. 2. Evidence of prior cholecystectomy. 3. Mild to moderate severity left basilar atelectasis and/or infiltrate with a small left pleural effusion. Electronically Signed: By: Virgina Norfolk M.D. On: 02/12/2019 19:18   US RENAL  Result Date: 02/27/2019 CLINICAL DATA:  76 year old with acute kidney injury and hematuria. EXAM: RENAL / URINARY TRACT ULTRASOUND COMPLETE COMPARISON:  No prior ultrasound. Unenhanced CT abdomen and pelvis 02/12/2019 is correlated. FINDINGS: Right Kidney: Renal measurements: Approximately 10.3 x 5.7 x 5.7 cm = volume: 176 mL. Normal parenchymal echotexture. Mild diffuse cortical thinning. No hydronephrosis. No parenchymal masses. No visible shadowing calculi. Left Kidney: Renal measurements: Approximately 12.8 x 7.0 x 8.6 cm = volume: 402 mL. Normal parenchymal echotexture. Mild diffuse cortical thinning. No hydronephrosis. No parenchymal masses. No visible shadowing calculi. Bladder: Obscured by overlying bowel gas.  Other: None.  IMPRESSION: 1. No evidence of hydronephrosis involving either kidney to suggest urinary tract obstruction. 2. Mild diffuse cortical thinning involving both kidneys. No focal abnormality involving either kidney and no visible shadowing calculi. Electronically Signed   By: Evangeline Dakin M.D.   On: 02/27/2019 14:01   DG CHEST PORT 1 VIEW  Result Date: 02/15/2019 CLINICAL DATA:  Cough, COVID positive 02/11/2019, hypertension, coronary artery disease, history MI EXAM: PORTABLE CHEST 1 VIEW COMPARISON:  Portable exam 0740 hours compared to 1901 hours FINDINGS: Nasogastric tube extends into stomach. Borderline enlargement of cardiac silhouette. Mediastinal contours and pulmonary vascularity normal. Atherosclerotic calcification aorta. LEFT upper lobe infiltrate consistent with pneumonia. Minimal RIGHT basilar atelectasis. No pleural effusion or pneumothorax. IMPRESSION: LEFT upper lobe infiltrate consistent with pneumonia. Electronically Signed   By: Lavonia Dana M.D.   On: 02/15/2019 07:56   DG Chest Portable 1 View  Result Date: 02/11/2019 CLINICAL DATA:  84 year old male with shortness of breath. EXAM: PORTABLE CHEST 1 VIEW COMPARISON:  Chest radiograph dated 01/26/2009. FINDINGS: There is cardiomegaly with mild vascular congestion. Minimal left mid to lower lung field subpleural hazy densities may represent atelectasis. Developing atypical infection is not excluded. Clinical correlation is recommended. No focal consolidation, pleural effusion, pneumothorax. No acute osseous pathology. IMPRESSION: 1. Cardiomegaly with mild vascular congestion. 2. Left mid to lower lung field faint atelectasis versus atypical infection. Clinical correlation is recommended. No focal consolidation. Electronically Signed   By: Anner Crete M.D.   On: 02/11/2019 19:20   DG Abd Portable 1V  Result Date: 02/16/2019 CLINICAL DATA:  Small-bowel obstruction EXAM: PORTABLE ABDOMEN - 1 VIEW COMPARISON:  02/15/2019 FINDINGS: NG  tube pulled back with the side hole now at the GE junction. Tip in the stomach. Mild small bowel dilatation unchanged. Colon decompressed. Contrast in the colon and rectum. IMPRESSION: Mild small bowel dilatation unchanged. NG pulled back with the side hole at the GE junction. Electronically Signed   By: Franchot Gallo M.D.   On: 02/16/2019 08:35   DG Abd Portable 1V  Result Date: 02/15/2019 CLINICAL DATA:  NG tube placement.  COVID positive. EXAM: PORTABLE ABDOMEN - 1 VIEW COMPARISON:  02/13/2019.  CT 02/12/2019. FINDINGS: NG tube noted with tip and side hole below left hemidiaphragm position of the stomach. Surgical clips right upper quadrant. Dilated loops of small bowel noted. Oral contrast in the colon. Similar findings noted on prior exam. No free air. IMPRESSION: 1.  NG tube noted with tip and side hole in the stomach. 2. Persistent dilated loops of small bowel. Contrast in the colon. Similar findings noted on prior exam. Electronically Signed   By: Marcello Moores  Register   On: 02/15/2019 12:56   DG Abd Portable 1V  Result Date: 02/13/2019 CLINICAL DATA:  SBO EXAM: PORTABLE ABDOMEN - 1 VIEW COMPARISON:  CT abdomen/pelvis dated 02/12/2019 FINDINGS: Dilated loops of small bowel in the left mid abdomen. However, contrast opacifies throughout the colon to the level of the distal sigmoid colon. Enteric tube terminates in the proximal gastric body. Degenerative changes of the lumbar spine. IMPRESSION: Enteric tube terminates in the proximal gastric body. Dilated loops of small bowel in the left mid abdomen, likely related to the patient's partial small bowel obstruction noted on CT. However, contrast from recent CT now opacifies throughout the colon to the level of the distal sigmoid colon, confirming that this is only partially obstructive. Electronically Signed   By: Julian Hy M.D.   On: 02/13/2019 07:54   DG  Abd Portable 1V  Result Date: 02/11/2019 CLINICAL DATA:  84 year old male with abdominal  pain. EXAM: PORTABLE ABDOMEN - 1 VIEW COMPARISON:  None. FINDINGS: Several mildly dilated loops of small bowel measuring up to 3.4 cm in caliber concerning for small-bowel obstruction. Clinical correlation is recommended. Small-bowel series may provide better evaluation. Air is noted within the stomach. No free air identified. Right upper quadrant cholecystectomy clips. Degenerative changes of the spine. No acute osseous pathology. IMPRESSION: Mildly dilated small bowel loops concerning for obstruction. Clinical correlation follow-up recommended. Electronically Signed   By: Anner Crete M.D.   On: 02/11/2019 22:58   DG Abd Portable 2V  Result Date: 02/12/2019 CLINICAL DATA:  Small-bowel obstruction. EXAM: PORTABLE ABDOMEN - 2 VIEW COMPARISON:  Plain film of the abdomen dated 02/11/2019. FINDINGS: Again noted are mildly distended gas-filled loops of small bowel within the LEFT abdomen, not significantly changed compared to the previous exam. No distended bowel loops are seen within the RIGHT abdomen or pelvis. No evidence of free intraperitoneal air. Lung bases are grossly clear. IMPRESSION: Persistent mildly distended gas-filled loops of small bowel within the LEFT abdomen, not significantly changed compared to the previous exam, compatible with partial small bowel obstruction versus ileus. Electronically Signed   By: Franki Cabot M.D.   On: 02/12/2019 13:49   ECHOCARDIOGRAM COMPLETE  Result Date: 02/13/2019   ECHOCARDIOGRAM REPORT   Patient Name:   DWRIGHT HABEL [facility] Date of Exam: 02/13/2019 Medical Rec #:  [id number]     Height:       73.0 in Accession #:    [id number]    Weight:       283.0 lb Date of Birth:  01-05-1936      BSA:          2.49 m Patient Age:    8 years      BP:           123/67 mmHg Patient Gender: M             HR:           100 bpm. Exam Location:  Inpatient Procedure: 2D Echo, Intracardiac Opacification Agent, Color Doppler and Cardiac            Doppler Indications:    Atrial  Fibrillation 427.31  History:        Patient has prior history of Echocardiogram examinations, most                 recent 01/29/2009. Previous Myocardial Infarction. Covid 19                 positive.  Sonographer:    Merrie Roof RDCS Referring Phys: [place]  1. Left ventricular ejection fraction, by visual estimation, is 60 to 65%. The left ventricle has normal function. There is moderately increased left ventricular hypertrophy.  2. Definity contrast agent was given IV to delineate the left ventricular endocardial borders.  3. Left ventricular diastolic parameters are indeterminate.  4. The left ventricle has no regional wall motion abnormalities.  5. Global right ventricle has normal systolic function.The right ventricular size is normal. Right vetricular wall thickness was not assessed.  6. Left atrial size was severely dilated.  7. Right atrial size was normal.  8. The mitral valve is grossly normal. No evidence of mitral valve regurgitation.  9. The tricuspid valve is grossly normal. 10. The tricuspid valve is grossly normal. Tricuspid valve regurgitation is mild. 11. The aortic valve  is tricuspid. Aortic valve regurgitation is not visualized. No evidence of aortic valve sclerosis or stenosis. 12. The pulmonic valve was grossly normal. Pulmonic valve regurgitation is not visualized. 13. Mildly elevated pulmonary artery systolic pressure. 14. The interatrial septum was not well visualized. FINDINGS  Left Ventricle: Left ventricular ejection fraction, by visual estimation, is 60 to 65%. The left ventricle has normal function. Definity contrast agent was given IV to delineate the left ventricular endocardial borders. The left ventricle has no regional wall motion abnormalities. The left ventricular internal cavity size was the left ventricle is normal in size. There is moderately increased left ventricular hypertrophy. Concentric left ventricular hypertrophy. Left ventricular diastolic  parameters are indeterminate. Right Ventricle: The right ventricular size is normal. Right vetricular wall thickness was not assessed. Global RV systolic function is has normal systolic function. The tricuspid regurgitant velocity is 2.65 m/s, and with an assumed right atrial pressure of 3 mmHg, the estimated right ventricular systolic pressure is mildly elevated at 31.1 mmHg. Left Atrium: Left atrial size was severely dilated. Right Atrium: Right atrial size was normal in size Pericardium: There is no evidence of pericardial effusion. Mitral Valve: The mitral valve is grossly normal. No evidence of mitral valve regurgitation. Tricuspid Valve: The tricuspid valve is grossly normal. Tricuspid valve regurgitation is mild. Aortic Valve: The aortic valve is tricuspid. Aortic valve regurgitation is not visualized. The aortic valve is structurally normal, with no evidence of sclerosis or stenosis. Mild aortic valve annular calcification. Pulmonic Valve: The pulmonic valve was grossly normal. Pulmonic valve regurgitation is not visualized. Pulmonic regurgitation is not visualized. Aorta: The aortic root is normal in size and structure. Venous: The inferior vena cava was not well visualized. IAS/Shunts: The interatrial septum was not well visualized.  LEFT VENTRICLE PLAX 2D LVIDd:         4.80 cm       Diastology LVIDs:         3.30 cm       LV e' lateral: 14.40 cm/s LV PW:         1.30 cm LV IVS:        1.40 cm LVOT diam:     2.00 cm LV SV:         63 ml LV SV Index:   24.21 LVOT Area:     3.14 cm  LV Volumes (MOD) LV area d, A2C:    25.20 cm LV area d, A4C:    28.70 cm LV area s, A2C:    11.10 cm LV area s, A4C:    17.40 cm LV major d, A2C:   7.62 cm LV major d, A4C:   7.89 cm LV major s, A2C:   5.17 cm LV major s, A4C:   6.14 cm LV vol d, MOD A2C: 66.5 ml LV vol d, MOD A4C: 88.5 ml LV vol s, MOD A2C: 19.0 ml LV vol s, MOD A4C: 40.1 ml LV SV MOD A2C:     47.5 ml LV SV MOD A4C:     88.5 ml LV SV MOD BP:      47.2 ml  RIGHT VENTRICLE RV S prime:     15.40 cm/s TAPSE (M-mode): 2.2 cm LEFT ATRIUM              Index LA diam:        4.40 cm  1.76 cm/m LA Vol (A2C):   135.0 ml 54.14 ml/m LA Vol (A4C):   167.0 ml 66.97 ml/m LA Biplane  Vol: 152.0 ml 60.95 ml/m  AORTIC VALVE LVOT Vmax:   87.90 cm/s LVOT Vmean:  57.800 cm/s LVOT VTI:    0.156 m  AORTA Ao Root diam: 3.10 cm Ao Asc diam:  3.30 cm TRICUSPID VALVE TR Peak grad:   28.1 mmHg TR Vmax:        265.00 cm/s  SHUNTS Systemic VTI:  0.16 m Systemic Diam: 2.00 cm  Kate Sable MD Electronically signed by Kate Sable MD Signature Date/Time: 02/13/2019/3:18:50 PM    Final     Micro Results    Recent Results (from the past 240 hour(s))  Surgical pcr screen     Status: None   Collection Time: 03/09/19  3:24 PM   Specimen: Nasal Mucosa; Nasal Swab  Result Value Ref Range Status   MRSA, PCR NEGATIVE NEGATIVE Final   Staphylococcus aureus NEGATIVE NEGATIVE Final    Comment: (NOTE) The Xpert SA Assay (FDA approved for NASAL specimens in patients 77 years of age and older), is one component of a comprehensive surveillance program. It is not intended to diagnose infection nor to guide or monitor treatment. Performed at [facility], [place] [address], [place], [place] [id number]     Today   Subjective    Derek Foster is eager to go to rehab today.  Other than feeling a little weak he generally is in good spirits.  He is little frustrated having to have the Foley replaced but he does understand that it is important for him because he was unable to void during his voiding trial 2 days ago.  Patient denies any chest pain shortness of breath abdominal pain constipation weakness or dizziness.   Objective   Blood pressure (!) 121/57, pulse 69, temperature 98.4 F (36.9 C), temperature source Oral, resp. rate 16, height 6' 0.99" (1.854 m), weight 124.8 kg, SpO2 96 %.   Intake/Output Summary (Last 24 hours) at 03/12/2019 1022 Last data filed at 03/12/2019  0842 Gross per 24 hour  Intake 10 ml  Output 1251 ml  Net -1241 ml    Exam Awake Alert, Oriented x 3, No new F.N deficits, Normal affect [place].AT,[name] Supple Neck,No JVD, No cervical lymphadenopathy appriciated.  Symmetrical Chest wall movement, Good air movement bilaterally, CTAB RRR,No Gallops,Rubs or new Murmurs, No Parasternal Heave +ve B.Sounds, Abd Soft, Non tender, No organomegaly appriciated, No rebound -guarding or rigidity. No Cyanosis, Clubbing or edema, No new Rash or bruise   Data Review   CBC w Diff:  Lab Results  Component Value Date   WBC 7.3 03/12/2019   HGB 7.9 (L) 03/12/2019   HCT 24.9 (L) 03/12/2019   PLT 218 03/12/2019   LYMPHOPCT 15 03/09/2019   MONOPCT 8 03/09/2019   EOSPCT 4 03/09/2019   BASOPCT 1 03/09/2019    CMP:  Lab Results  Component Value Date   NA 138 03/12/2019   K 3.7 03/12/2019   CL 104 03/12/2019   CO2 25 03/12/2019   BUN 20 03/12/2019   CREATININE 1.20 03/12/2019   PROT 5.0 (L) 03/04/2019   ALBUMIN 2.0 (L) 03/04/2019   BILITOT 0.5 03/04/2019   ALKPHOS 86 03/04/2019   AST 15 03/04/2019   ALT 23 03/04/2019  .   Total Time in preparing paper work, data evaluation and todays exam - 35 minutes  Vashti Hey M.D on 03/12/2019 at 10:22 AM  [facility]   Office  [phone]

## 2019-03-16 ENCOUNTER — Encounter: Payer: Self-pay | Admitting: Internal Medicine

## 2019-03-16 ENCOUNTER — Encounter: Payer: Self-pay | Admitting: *Deleted

## 2019-03-16 ENCOUNTER — Ambulatory Visit (INDEPENDENT_AMBULATORY_CARE_PROVIDER_SITE_OTHER): Payer: PPO | Admitting: Cardiovascular Disease

## 2019-03-16 ENCOUNTER — Non-Acute Institutional Stay (SKILLED_NURSING_FACILITY): Payer: PPO | Admitting: Internal Medicine

## 2019-03-16 ENCOUNTER — Encounter: Payer: Self-pay | Admitting: Cardiovascular Disease

## 2019-03-16 VITALS — BP 108/63 | HR 85 | Temp 94.8°F | Ht 73.0 in | Wt 267.0 lb

## 2019-03-16 DIAGNOSIS — I2510 Atherosclerotic heart disease of native coronary artery without angina pectoris: Secondary | ICD-10-CM

## 2019-03-16 DIAGNOSIS — I25118 Atherosclerotic heart disease of native coronary artery with other forms of angina pectoris: Secondary | ICD-10-CM | POA: Diagnosis not present

## 2019-03-16 DIAGNOSIS — I4891 Unspecified atrial fibrillation: Secondary | ICD-10-CM

## 2019-03-16 DIAGNOSIS — E0590 Thyrotoxicosis, unspecified without thyrotoxic crisis or storm: Secondary | ICD-10-CM | POA: Diagnosis not present

## 2019-03-16 DIAGNOSIS — E782 Mixed hyperlipidemia: Secondary | ICD-10-CM | POA: Diagnosis not present

## 2019-03-16 DIAGNOSIS — I4819 Other persistent atrial fibrillation: Secondary | ICD-10-CM

## 2019-03-16 DIAGNOSIS — I10 Essential (primary) hypertension: Secondary | ICD-10-CM

## 2019-03-16 DIAGNOSIS — B964 Proteus (mirabilis) (morganii) as the cause of diseases classified elsewhere: Secondary | ICD-10-CM

## 2019-03-16 DIAGNOSIS — N179 Acute kidney failure, unspecified: Secondary | ICD-10-CM

## 2019-03-16 DIAGNOSIS — N3090 Cystitis, unspecified without hematuria: Secondary | ICD-10-CM | POA: Diagnosis not present

## 2019-03-16 DIAGNOSIS — K56609 Unspecified intestinal obstruction, unspecified as to partial versus complete obstruction: Secondary | ICD-10-CM

## 2019-03-16 DIAGNOSIS — R310 Gross hematuria: Secondary | ICD-10-CM

## 2019-03-16 DIAGNOSIS — B9689 Other specified bacterial agents as the cause of diseases classified elsewhere: Secondary | ICD-10-CM

## 2019-03-16 DIAGNOSIS — N390 Urinary tract infection, site not specified: Secondary | ICD-10-CM

## 2019-03-16 DIAGNOSIS — B961 Klebsiella pneumoniae [K. pneumoniae] as the cause of diseases classified elsewhere: Secondary | ICD-10-CM

## 2019-03-16 NOTE — Patient Instructions (Signed)
Medication Instructions:  STOP ASPIRIN   DECREASE AMIODARONE TO 200 MG ONCE DAILY   *If you need a refill on your cardiac medications before your next appointment, please call your pharmacy*  Lab Work: FT4/TSH IN 3 MONTHS FEW DAYS PRIOR TO FOLLOW UP VISIT   If you have labs (blood work) drawn today and your tests are completely normal, you will receive your results only by: Marland Kitchen MyChart Message (if you have MyChart) OR . A paper copy in the mail If you have any lab test that is abnormal or we need to change your treatment, we will call you to review the results.  Testing/Procedures: NONE  Follow-Up: At [facility], you and your health needs are our priority.  As part of our continuing mission to provide you with exceptional heart care, we have created designated Provider Care Teams.  These Care Teams include your primary Cardiologist (physician) and Advanced Practice Providers (APPs -  Physician Assistants and Nurse Practitioners) who all work together to provide you with the care you need, when you need it.  Your next appointment:   3 month(s) AFTER LABS  The format for your next appointment:   In Person  Provider:   You may see Skeet Latch, MD or one of the following Advanced Practice Providers on your designated Care Team:    Kerin Ransom, PA-C  [place], [place]  Coletta Memos, [place]

## 2019-03-16 NOTE — Progress Notes (Signed)
Cardiology Office Note   Date:  03/16/2019   ID:  Derek Foster, MRN [id number]  PCP:  Celene Squibb, MD  Cardiologist:   Skeet Latch, MD   No chief complaint on file.    History of Present Illness: Derek Foster is a 84 y.o. male with CAD status post anterior MI 01/2012 (LAD PCI), hypertension, hyperlipidemia who presents for follow-up.  At the time of his heart cath in 2014 he underwent stenting of the LAD and had residual moderate RCA stenosis.  He last saw Dr. Johnsie Cancel in 2014.  He was admitted 01/2019 for SBO and new onset atrial fibrillation in the setting of COVID-19.  That hospitalization is heart rate was difficult to control.  He was started on amiodarone, digoxin, metoprolol, and diltiazem.  He was given Eliquis for anticoagulation.  Plans are made for consideration of outpatient cardioversion.  Echo that admission revealed LVEF 60 to 65% with moderate LVH.  He was discharged to rehab.  He has been doing well physically.  He thinks that his heart rate has been pretty well-controlled.  He participates in physical therapy without difficulty.  He notes some mild lower extremity edema but no orthopnea or PND.  He denies any palpitations.  At times he gets a little lightheaded when he first stands but denies any syncope.  He notes that his appetite has been poor and he has lost 30 pounds.  He is afraid of upsetting his stomach again so he tries not to eat much.  His hospitalization was also complicated by gross hematuria not responsive to CBI.  He required wet clot evacuation by cystoscopy.  Eliquis was initially held but restarted prior to discharge.  He was discharged to SNF and continues to work with physical therapy.  Foley catheter is still in place.  He has struggled with the death of his wife.  She passed from  Covid 19 just prior to his admission to the hospital.   Past Medical History:  Diagnosis Date  . Acute myocardial infarction, unspecified site, episode of  care unspecified   . CAD in native artery    a. cath 01/27/2009 : s/p promus DES to LAD and medical managment of 70-80% mRCA  . Edema   . HTN (hypertension)   . Mixed hyperlipidemia   . Unspecified hypothyroidism     Past Surgical History:  Procedure Laterality Date  . CHOLECYSTECTOMY    . CORONARY STENT PLACEMENT    . CYSTOSCOPY/RETROGRADE/URETEROSCOPY Bilateral 03/09/2019   Procedure: CYSTOSCOPY, Clot Evacuation, Fulguration;  Surgeon: Festus Aloe, MD;  Location: [facility];  Service: Urology;  Laterality: Bilateral;  . HERNIA REPAIR       No current outpatient medications on file.   No current facility-administered medications for this visit.    Allergies:   Indomethacin and Iodine-131    Social History:  The patient  reports that he quit smoking about 62 years ago. He has never used smokeless tobacco. He reports that he does not drink alcohol or use drugs.   Family History:  The patient's family history includes Heart attack in his father; Lung cancer in his sister.    ROS:  Please see the history of present illness.   Otherwise, review of systems are positive for none.   All other systems are reviewed and negative.    PHYSICAL EXAM: VS:  BP 108/63   Pulse 85   Temp (!) 94.8 F (34.9 C)   Ht 6\' 1"  (1.854  m)   Wt 267 lb (121.1 kg)   SpO2 100%   BMI 35.23 kg/m  , BMI Body mass index is 35.23 kg/m. GENERAL:  Chronically ill-appearing HEENT:  Pupils equal round and reactive, fundi not visualized, oral mucosa unremarkable NECK:  No jugular venous distention, waveform within normal limits, carotid upstroke brisk and symmetric, no bruits LUNGS:  Clear to auscultation bilaterally HEART: Irregularly irregular.  PMI not displaced or sustained,S1 and S2 within normal limits, no S3, no S4, no clicks, no rubs, no murmurs ABD:  Flat, positive bowel sounds normal in frequency in pitch, no bruits, no rebound, no guarding, no midline pulsatile mass, no hepatomegaly, no  splenomegaly EXT:  2 plus pulses throughout, trace edema, no cyanosis no clubbing SKIN:  No rashes no nodules NEURO:  Cranial nerves II through XII grossly intact, motor grossly intact throughout PSYCH:  Cognitively intact, oriented to person place and time   EKG:  EKG is ordered today. The ekg ordered today demonstrates atrial fibrillation.  Rate 85 bpm.  Nonspecific T wave abnormalities.  Echocardiogram 02/13/2019:  Impressions: 1. Left ventricular ejection fraction, by visual estimation, is 60 to 65%. The left ventricle has normal function. There is moderately increased left ventricular hypertrophy. 2. Definity contrast agent was given IV to delineate the left ventricular endocardial borders. 3. Left ventricular diastolic parameters are indeterminate. 4. The left ventricle has no regional wall motion abnormalities. 5. Global right ventricle has normal systolic function.The right ventricular size is normal. Right vetricular wall thickness was not assessed. 6. Left atrial size was severely dilated. 7. Right atrial size was normal. 8. The mitral valve is grossly normal. No evidence of mitral valve regurgitation. 9. The tricuspid valve is grossly normal. 10. The tricuspid valve is grossly normal. Tricuspid valve regurgitation is mild. 11. The aortic valve is tricuspid. Aortic valve regurgitation is not visualized. No evidence of aortic valve sclerosis or stenosis. 12. The pulmonic valve was grossly normal. Pulmonic valve regurgitation is not visualized. 13. Mildly elevated pulmonary artery systolic pressure. 14. The interatrial septum was not well visualized.   Recent Labs: 03/03/2019: B Natriuretic Peptide 345.4 03/04/2019: ALT 23 03/07/2019: TSH 0.013 03/09/2019: Magnesium 1.8 03/12/2019: BUN 20; Creatinine, Ser 1.20; Hemoglobin 7.9; Platelets 218; Potassium 3.7; Sodium 138    Lipid Panel    Component Value Date/Time   CHOL 138 05/28/2011 1030   TRIG 81 05/28/2011 1030    HDL 43 05/28/2011 1030   CHOLHDL 3.2 05/28/2011 1030   VLDL 16 05/28/2011 1030   LDLCALC 79 05/28/2011 1030      Wt Readings from Last 3 Encounters:  03/16/19 269 lb (122 kg)  03/16/19 267 lb (121.1 kg)  03/12/19 275 lb 2.2 oz (124.8 kg)      ASSESSMENT AND PLAN:  # PAF:  Mr. Aungst remains in atrial fibrillation.  His rates are well-controlled.  We will reduce amiodarone to 200 mg daily.  Continue Eliquis, digoxin, diltiazem, and metoprolol.  Given that he is still hyperthyroid we will not pursue cardioversion at this time.  In 3 months we will have his thyroid rechecked and plan for cardioversion if he is euthyroid.  # CAD s/p PCI: Stable.  Prior LAD PCI.  He has known residual RCA disease.  He has no angina.  We will stop aspirin given that he is on Eliquis.  Continue metoprolol and pravastatin.   # Hypertension:  BP is a little low but he is stable.  Continue diltiazem and metoprolol.   # AKI: Resolved  prior to discharge.  # Hyperthyroidism: Continue methimazole.  He will need TSH/free T4 checked prior to follow up in 3 months.     Current medicines are reviewed at length with the patient today.  The patient does not have concerns regarding medicines.  The following changes have been made:  Reduce amiodarone to daily and stop aspirin.  Labs/ tests ordered today include:   Orders Placed This Encounter  Procedures  . T4, free  . TSH  . EKG 12-Lead     Disposition:   FU with Kassi Esteve C. Oval Linsey, MD, [facility] in 3 months.   Signed, Roger Fasnacht C. Oval Linsey, MD, [facility]  03/16/2019 1:06 PM    [facility]

## 2019-03-16 NOTE — Progress Notes (Signed)
: Provider:  Hennie Duos., MD Location:  [place] Room Number: 150-P Place of Service:  SNF ([phone])  PCP: Celene Squibb, MD Patient Care Team: Celene Squibb, MD as PCP - General (Internal Medicine) Skeet Latch, MD as PCP - Cardiology (Cardiology)  Extended Emergency Contact Information Primary Emergency Contact: Taran, [place] Mobile Phone: [phone] Relation: Daughter Secondary Emergency Contact: Wey,Jay Mobile Phone: [phone] Relation: Son     Allergies: Indomethacin and Iodine-131  Chief Complaint  Patient presents with  . New Admit To SNF    New admission to [facility]    HPI: Patient is an 84 y.o. male with CAD status post stent 2011, hypertension, hyperlipidemia, GERD, hypothyroidism was admitted to [facility] 1/22-2/20 for abdominal pain, nausea, and vomiting, with generalized weakness since his recent Covid diagnosis on 2023-02-15.  Patient's wife passed away from Covid 2 weeks prior to presentation.  Work-up revealed small bowel obstruction treated conservatively with NG tube and bowel rest.  Hospital course was complicated by atrial for with RVR treated with amiodarone then Cardizem metoprolol and digoxin.  Course was also complicated by gross hematuria requiring clot evacuation by cystoscopy syncope on 2/17.  Patient failed voiding trial and Foley was replaced night prior to discharge.  Eliquis was restarted on the day of discharge.  Patient is admitted to skilled nursing facility for OT/PT.  While at skilled nursing facility patient will be followed for hypertension treated with Cardizem and metoprolol, GERD treated with Protonix and hyperlipidemia treated with pravastatin.  Past Medical History:  Diagnosis Date  . Acute myocardial infarction, unspecified site, episode of care unspecified   . CAD in native artery    a. cath 01/27/2009 : s/p promus DES to LAD and medical managment of 70-80% mRCA  . Edema   . HTN  (hypertension)   . Mixed hyperlipidemia   . Unspecified hypothyroidism     Past Surgical History:  Procedure Laterality Date  . CHOLECYSTECTOMY    . CORONARY STENT PLACEMENT    . CYSTOSCOPY/RETROGRADE/URETEROSCOPY Bilateral 03/09/2019   Procedure: CYSTOSCOPY, Clot Evacuation, Fulguration;  Surgeon: Festus Aloe, MD;  Location: [facility];  Service: Urology;  Laterality: Bilateral;  . HERNIA REPAIR      Allergies as of 03/16/2019      Reactions   Indomethacin Anaphylaxis   But tolerates ibuprofen, aleve   Iodine-131 Anaphylaxis      Medication List    Notice   This visit is during an admission. Changes to the med list made in this visit will be reflected in the After Visit Summary of the admission.    Current Outpatient Medications on File Prior to Visit  Medication Sig Dispense Refill  . amiodarone (PACERONE) 200 MG tablet Take 200 mg by mouth 2 (two) times daily.     Marland Kitchen apixaban (ELIQUIS) 5 MG TABS tablet Take 1 tablet (5 mg total) by mouth 2 (two) times daily. 60 tablet   . aspirin EC 81 MG tablet Take 81 mg by mouth daily.    . benzonatate (TESSALON) 100 MG capsule Take 100 mg by mouth 3 (three) times daily as needed for cough.    . bisacodyl (DULCOLAX) 10 MG suppository Place 1 suppository (10 mg total) rectally daily as needed for severe constipation. 12 suppository 0  . Coenzyme Q10 200 MG capsule Take 200 mg by mouth daily.      Marland Kitchen dextromethorphan 15 MG/5ML syrup Take 15 mLs by mouth every 6 (six) hours as  needed for cough.    . digoxin (LANOXIN) 0.125 MG tablet Take 1 tablet (0.125 mg total) by mouth daily.    Marland Kitchen diltiazem (CARDIZEM CD) 360 MG 24 hr capsule Take 1 capsule (360 mg total) by mouth daily.    . feeding supplement, ENSURE ENLIVE, (ENSURE ENLIVE) LIQD Take 237 mLs by mouth daily in the afternoon. 237 mL 12  . finasteride (PROSCAR) 5 MG tablet Take 1 tablet (5 mg total) by mouth daily.    . fish oil-omega-3 fatty acids 1000 MG capsule Take 2 g by mouth daily.       . methimazole (TAPAZOLE) 10 MG tablet Take 2 tablets (20 mg total) by mouth 3 (three) times daily.    . metoprolol tartrate (LOPRESSOR) 100 MG tablet Take 2 tablets (200 mg total) by mouth 2 (two) times daily.    . Multiple Vitamin (MULTIVITAMIN) tablet Take 1 tablet by mouth daily.      . nitroGLYCERIN (NITROSTAT) 0.4 MG SL tablet Place 1 tablet (0.4 mg total) under the tongue every 5 (five) minutes as needed. 25 tablet 3  . ondansetron (ZOFRAN) 4 MG/2ML SOLN injection Inject 2 mLs (4 mg total) into the vein every 6 (six) hours as needed for nausea or vomiting. 2 mL 0  . pantoprazole (PROTONIX) 40 MG tablet Take 1 tablet (40 mg total) by mouth 2 (two) times daily.    . polyethylene glycol (MIRALAX / GLYCOLAX) 17 g packet Take 17 g by mouth daily as needed for mild constipation. 14 each 0  . pravastatin (PRAVACHOL) 10 MG tablet Take 1 tablet (10 mg total) by mouth daily at 6 PM.    . senna-docusate (SENOKOT-S) 8.6-50 MG tablet Take 1 tablet by mouth 2 (two) times daily.    . tamsulosin (FLOMAX) 0.4 MG CAPS capsule Take 1 capsule (0.4 mg total) by mouth daily after breakfast. 30 capsule   . [DISCONTINUED] simvastatin (ZOCOR) 20 MG tablet Take 1 tablet (20 mg total) by mouth every evening. 30 tablet 11   No current facility-administered medications on file prior to visit.     No orders of the defined types were placed in this encounter.    There is no immunization history on file for this patient.  Social History   Tobacco Use  . Smoking status: Former Smoker    Quit date: 01/20/1957    Years since quitting: 62.1  . Smokeless tobacco: Never Used  Substance Use Topics  . Alcohol use: No    Family history is   Family History  Problem Relation Age of Onset  . Heart attack Father   . Lung cancer Sister       Review of Systems  GENERAL:  no fevers, fatigue, appetite changes SKIN: No itching, or rash EYES: No eye pain, redness, discharge EARS: No earache, tinnitus, change in  hearing NOSE: No congestion, drainage or bleeding  MOUTH/THROAT: No mouth or tooth pain, No sore throat RESPIRATORY: No cough, wheezing, SOB CARDIAC: No chest pain, palpitations, lower extremity edema  GI: No abdominal pain, No N/V/D or constipation, No heartburn or reflux  GU: No dysuria, frequency or urgency, or incontinence  MUSCULOSKELETAL: No unrelieved bone/joint pain NEUROLOGIC: No headache, dizziness or focal weakness PSYCHIATRIC: No c/o anxiety or sadness   Vitals:   03/16/19 1015  BP: 122/78  Pulse: 84  Resp: 20  Temp: (!) 97.4 F (36.3 C)    SpO2 Readings from Last 1 Encounters:  03/16/19 100%   Body mass index is 35.49  kg/m.     Physical Exam  GENERAL APPEARANCE: Alert, conversant,  No acute distress.  SKIN: No diaphoresis rash HEAD: Normocephalic, atraumatic  EYES: Conjunctiva/lids clear. Pupils round, reactive. EOMs intact.  EARS: External exam WNL, canals clear. Hearing grossly normal.  NOSE: No deformity or discharge.  MOUTH/THROAT: Lips w/o lesions  RESPIRATORY: Breathing is even, unlabored. Lung sounds are clear   CARDIOVASCULAR: Heart RRR no murmurs, rubs or gallops. No peripheral edema.   GASTROINTESTINAL: Abdomen is soft, non-tender, not distended w/ normal bowel sounds. GENITOURINARY: Bladder non tender, not distended  MUSCULOSKELETAL: No abnormal joints or musculature NEUROLOGIC:  Cranial nerves 2-12 grossly intact. Moves all extremities  PSYCHIATRIC: Mood and affect appropriate to situation, no behavioral issues  Patient Active Problem List   Diagnosis Date Noted  . Palliative care by specialist   . DNR (do not resuscitate)   . Weakness generalized   . Grief   . Gross hematuria   . AKI (acute kidney injury) ([place]) 02/12/2019  . SBO (small bowel obstruction) ([place]) 02/12/2019  . CAD (coronary artery disease) 02/11/2019  . Atrial fibrillation with RVR ([place]) 02/11/2019  . Mixed hyperlipidemia 09/10/2009  . EDEMA 02/27/2009  . HYPOTHYROIDISM  02/26/2009  . Essential hypertension 02/26/2009  . MYOCARDIAL INFARCTION 02/26/2009      Labs reviewed: Basic Metabolic Panel:    Component Value Date/Time   NA 138 03/12/2019 0422   K 3.7 03/12/2019 0422   CL 104 03/12/2019 0422   CO2 25 03/12/2019 0422   GLUCOSE 100 (H) 03/12/2019 0422   BUN 20 03/12/2019 0422   CREATININE 1.20 03/12/2019 0422   CALCIUM 8.1 (L) 03/12/2019 0422   PROT 5.0 (L) 03/04/2019 0311   ALBUMIN 2.0 (L) 03/04/2019 0311   AST 15 03/04/2019 0311   ALT 23 03/04/2019 0311   ALKPHOS 86 03/04/2019 0311   BILITOT 0.5 03/04/2019 0311   GFRNONAA 56 (L) 03/12/2019 0422   GFRAA >60 03/12/2019 0422    Recent Labs    03/03/19 0737 03/04/19 0311 03/08/19 0421 03/08/19 0421 03/09/19 0429 03/09/19 0429 03/10/19 0336 03/11/19 0400 03/12/19 0422  NA 136   < > 136   < > 137   < > 136 138 138  K 3.8   < > 4.1   < > 4.0   < > 4.3 3.9 3.7  CL 102   < > 101   < > 103   < > 102 103 104  CO2 25   < > 25   < > 24   < > 24 25 25   GLUCOSE 117*   < > 116*   < > 105*   < > 143* 120* 100*  BUN 19   < > 11   < > 12   < > 10 15 20   CREATININE 1.15   < > 1.25*   < > 1.46*   < > 1.09 1.21 1.20  CALCIUM 8.0*   < > 8.2*   < > 8.1*   < > 8.2* 8.2* 8.1*  MG 2.0  --  1.8  --  1.8  --   --   --   --    < > = values in this interval not displayed.   Liver Function Tests: Recent Labs    03/02/19 0438 03/03/19 0737 03/04/19 0311  AST 24 19 15   ALT 32 27 23  ALKPHOS 97 82 86  BILITOT 0.4 0.3 0.5  PROT 5.2* 5.1* 5.0*  ALBUMIN 1.8* 2.0* 2.0*  No results for input(s): LIPASE, AMYLASE in the last 8760 hours. No results for input(s): AMMONIA in the last 8760 hours. CBC: Recent Labs    03/04/19 0311 03/05/19 0721 03/08/19 0421 03/08/19 0421 03/09/19 0429 03/09/19 0429 03/10/19 0336 03/11/19 0400 03/12/19 0422  WBC 5.5   < > 6.8   < > 6.7   < > 6.9 7.8 7.3  NEUTROABS 3.3  --  3.8  --  4.8  --   --   --   --   HGB 8.6*   < > 8.3*   < > 7.9*   < > 8.4* 7.9* 7.9*    HCT 25.8*   < > 25.1*   < > 24.1*   < > 25.9* 24.7* 24.9*  MCV 89.6   < > 91.9   < > 91.6   < > 92.2 92.9 92.9  PLT 233   < > 227   < > 217   < > 230 223 218   < > = values in this interval not displayed.   Lipid No results for input(s): CHOL, HDL, LDLCALC, TRIG in the last 8760 hours.  Cardiac Enzymes: No results for input(s): CKTOTAL, CKMB, CKMBINDEX, TROPONINI in the last 8760 hours. BNP: Recent Labs    03/01/19 0457 03/02/19 0439 03/03/19 0737  BNP 275.8* 317.6* 345.4*   No results found for: [facility] Lab Results  Component Value Date   HGBA1C (L) 01/26/2009    4.5 (NOTE) The ADA recommends the following therapeutic goal for glycemic control related to Hgb A1c measurement: Goal of therapy: <6.5 Hgb A1c  Reference: American Diabetes Association: Clinical Practice Recommendations 2010, Diabetes Care, 2010, 33: (Suppl  1).   Lab Results  Component Value Date   TSH 0.013 (L) 03/07/2019   No results found for: VITAMINB12 No results found for: FOLATE Lab Results  Component Value Date   IRON 46 03/10/2019   TIBC 206 (L) 03/10/2019   FERRITIN 199 03/10/2019    Imaging and Procedures obtained prior to SNF admission: No results found.   Not all labs, radiology exams or other studies done during hospitalization come through on my EPIC note; however they are reviewed by me.    Assessment and Plan  Small bowel obstruction-resolved with conservative treatment NG tube placement bowel rest; tolerating diet discharge; thought to be likely to adhesions from prior surgeries and ventral hernia SNF-admitted for OT/PT  New onset atrial fibrillation with RVR-preserved EF on echo SNF-continue amiodarone 200 mg twice daily, Cardizem 360 mg daily, metoprolo 200 mg twice daily l, digoxin 0.125 mg daily, for rate and Eliquis 5 mg twice daily as prophylaxis  Hyerthyroidism-TSH/T4 consistent with hyperthyroidism; methimazole dose has been titrated to 20 mg ; patient will need  outpatient follow-up with endocrinology SNF-continue methimazole 20 mg 3 times daily; follow-up TSH/T4  Proteus and Klebsiella UTI-completed course of antibiotics with Rocephin  Hematuria secondary to Foley trauma-in the setting of very large punctate anticoagulation; late because immaturity not responsive to CBI patient is status post cystoscopy with clot evacuation on 2/17; patient failed voiding trial and Foley replaced on 2/19; Foley to remain in place for 3 weeks and voiding trial attempted began SNF-continue Flomax 0.4 mg daily and Proscar 5 mg daily  Recent COVID-19 infection-residual secondary weakness secondary to this without respiratory symptoms  CAD-remote history of stents SNF-continue metoprolol 200 mg twice daily, ASA 81 mg daily, nitroglycerin 0.4 mg sublingual as needed; patient is on statin  Hypertension SNF-controlled;: Continue metoprolol 200 mg  twice daily, Cardizem 360 mg daily  Hyperlipidemia SNF-continue fish oil 2 g daily and Pravachol 10 mg daily     Time spent greater than 45 minutes;> 50% of time with patient was spent reviewing records, labs, tests and studies, counseling and developing plan of care  Hennie Duos, MD

## 2019-03-18 ENCOUNTER — Encounter: Payer: Self-pay | Admitting: Internal Medicine

## 2019-03-18 DIAGNOSIS — B964 Proteus (mirabilis) (morganii) as the cause of diseases classified elsewhere: Secondary | ICD-10-CM | POA: Insufficient documentation

## 2019-03-18 DIAGNOSIS — B9689 Other specified bacterial agents as the cause of diseases classified elsewhere: Secondary | ICD-10-CM | POA: Insufficient documentation

## 2019-03-18 DIAGNOSIS — N390 Urinary tract infection, site not specified: Secondary | ICD-10-CM | POA: Insufficient documentation

## 2019-03-18 DIAGNOSIS — N3090 Cystitis, unspecified without hematuria: Secondary | ICD-10-CM | POA: Insufficient documentation

## 2019-03-18 DIAGNOSIS — B961 Klebsiella pneumoniae [K. pneumoniae] as the cause of diseases classified elsewhere: Secondary | ICD-10-CM | POA: Insufficient documentation

## 2019-03-22 DIAGNOSIS — R310 Gross hematuria: Secondary | ICD-10-CM | POA: Diagnosis not present

## 2019-03-22 DIAGNOSIS — R338 Other retention of urine: Secondary | ICD-10-CM | POA: Diagnosis not present

## 2019-03-23 ENCOUNTER — Encounter: Payer: Self-pay | Admitting: Adult Health

## 2019-03-23 ENCOUNTER — Non-Acute Institutional Stay (SKILLED_NURSING_FACILITY): Payer: PPO | Admitting: Adult Health

## 2019-03-23 DIAGNOSIS — K56609 Unspecified intestinal obstruction, unspecified as to partial versus complete obstruction: Secondary | ICD-10-CM

## 2019-03-23 DIAGNOSIS — E0590 Thyrotoxicosis, unspecified without thyrotoxic crisis or storm: Secondary | ICD-10-CM | POA: Diagnosis not present

## 2019-03-23 DIAGNOSIS — N400 Enlarged prostate without lower urinary tract symptoms: Secondary | ICD-10-CM | POA: Diagnosis not present

## 2019-03-23 DIAGNOSIS — I10 Essential (primary) hypertension: Secondary | ICD-10-CM

## 2019-03-23 DIAGNOSIS — E782 Mixed hyperlipidemia: Secondary | ICD-10-CM

## 2019-03-23 DIAGNOSIS — E43 Unspecified severe protein-calorie malnutrition: Secondary | ICD-10-CM | POA: Diagnosis not present

## 2019-03-23 DIAGNOSIS — K219 Gastro-esophageal reflux disease without esophagitis: Secondary | ICD-10-CM

## 2019-03-23 DIAGNOSIS — I25118 Atherosclerotic heart disease of native coronary artery with other forms of angina pectoris: Secondary | ICD-10-CM

## 2019-03-23 DIAGNOSIS — I4891 Unspecified atrial fibrillation: Secondary | ICD-10-CM | POA: Diagnosis not present

## 2019-03-23 NOTE — Progress Notes (Signed)
Location:    [place] Room Number: 150/P Place of Service:  SNF (31)   CODE STATUS: DNR  Allergies  Allergen Reactions  . Indomethacin Anaphylaxis    But tolerates ibuprofen, aleve  . Iodine-131 Anaphylaxis   Chief Complaint  Patient presents with  . Medical Management of Chronic Issues         Atrial fibrillation with RVR:   Coronary artery disease of native artery of native heart without angina:   Essential hypertension   Weekly follow up for the first 30 days post hospitalization.     HPI:  He is a 84 year old short term rehab patient being seen for the management of his chronic illnesses: afib; cad; hypertension. He had been seen by urology and has had his foley removed. He has failed his voiding trial and will need the foley reinserted. No reports of uncontrolled pain. No changes in appetite no reports of constipation.   Past Medical History:  Diagnosis Date  . Acute myocardial infarction, unspecified site, episode of care unspecified   . CAD in native artery    a. cath 01/27/2009 : s/p promus DES to LAD and medical managment of 70-80% mRCA  . Edema   . HTN (hypertension)   . Mixed hyperlipidemia   . Unspecified hypothyroidism     Past Surgical History:  Procedure Laterality Date  . CHOLECYSTECTOMY    . CORONARY STENT PLACEMENT    . CYSTOSCOPY/RETROGRADE/URETEROSCOPY Bilateral 03/09/2019   Procedure: CYSTOSCOPY, Clot Evacuation, Fulguration;  Surgeon: Festus Aloe, MD;  Location: [place];  Service: Urology;  Laterality: Bilateral;  . HERNIA REPAIR      Social History   Socioeconomic History  . Marital status: Married    Spouse name: Not on file  . Number of children: Not on file  . Years of education: Not on file  . Highest education level: Not on file  Occupational History  . Occupation: Charity fundraiser  Tobacco Use  . Smoking status: Former Smoker    Quit date: 01/20/1957    Years since quitting: 62.2  . Smokeless tobacco: Never Used   Substance and Sexual Activity  . Alcohol use: No  . Drug use: Never  . Sexual activity: Not on file  Other Topics Concern  . Not on file  Social History Narrative   Former smoker.   Social Determinants of Health   Financial Resource Strain:   . Difficulty of Paying Living Expenses: Not on file  Food Insecurity:   . Worried About Charity fundraiser in the Last Year: Not on file  . Ran Out of Food in the Last Year: Not on file  Transportation Needs:   . Lack of Transportation (Medical): Not on file  . Lack of Transportation (Non-Medical): Not on file  Physical Activity:   . Days of Exercise per Week: Not on file  . Minutes of Exercise per Session: Not on file  Stress:   . Feeling of Stress : Not on file  Social Connections:   . Frequency of Communication with Friends and Family: Not on file  . Frequency of Social Gatherings with Friends and Family: Not on file  . Attends Religious Services: Not on file  . Active Member of Clubs or Organizations: Not on file  . Attends Archivist Meetings: Not on file  . Marital Status: Not on file  Intimate Partner Violence:   . Fear of Current or Ex-Partner: Not on file  . Emotionally Abused: Not  on file  . Physically Abused: Not on file  . Sexually Abused: Not on file   Family History  Problem Relation Age of Onset  . Heart attack Father   . Lung cancer Sister       VITAL SIGNS BP 138/65   Pulse 69   Temp 98 F (36.7 C) (Oral)   Resp 20   Ht 6\' 1"  (1.854 m)   Wt 273 lb (123.8 kg)   BMI 36.02 kg/m   Outpatient Encounter Medications as of 03/23/2019  Medication Sig  . amiodarone (PACERONE) 200 MG tablet Take 200 mg by mouth 2 (two) times daily.   Marland Kitchen apixaban (ELIQUIS) 5 MG TABS tablet Take 1 tablet (5 mg total) by mouth 2 (two) times daily.  . benzonatate (TESSALON) 100 MG capsule Take 100 mg by mouth every 8 (eight) hours as needed for cough.   . bisacodyl (DULCOLAX) 10 MG suppository Place 1 suppository (10 mg  total) rectally daily as needed for severe constipation.  . Coenzyme Q10 200 MG capsule Take 200 mg by mouth daily.    Marland Kitchen dextromethorphan 15 MG/5ML syrup Take 15 mLs by mouth every 6 (six) hours as needed for cough.  . digoxin (LANOXIN) 0.125 MG tablet Take 1 tablet (0.125 mg total) by mouth daily.  Marland Kitchen diltiazem (CARDIZEM CD) 360 MG 24 hr capsule Take 1 capsule (360 mg total) by mouth daily.  . feeding supplement, ENSURE ENLIVE, (ENSURE ENLIVE) LIQD Take 237 mLs by mouth daily in the afternoon.  . finasteride (PROSCAR) 5 MG tablet Take 1 tablet (5 mg total) by mouth daily.  . fish oil-omega-3 fatty acids 1000 MG capsule Take 2 g by mouth daily.    . methimazole (TAPAZOLE) 10 MG tablet Take 10 mg by mouth every 8 (eight) hours.  . metoprolol tartrate (LOPRESSOR) 100 MG tablet Take 2 tablets (200 mg total) by mouth 2 (two) times daily.  . Multiple Vitamin (MULTIVITAMIN) tablet Take 1 tablet by mouth daily.    . nitroGLYCERIN (NITROSTAT) 0.4 MG SL tablet Place 1 tablet (0.4 mg total) under the tongue every 5 (five) minutes as needed.  . ondansetron (ZOFRAN) 4 MG/2ML SOLN injection Inject 2 mLs (4 mg total) into the vein every 6 (six) hours as needed for nausea or vomiting.  . pantoprazole (PROTONIX) 40 MG tablet Take 1 tablet (40 mg total) by mouth 2 (two) times daily.  . polyethylene glycol (MIRALAX / GLYCOLAX) 17 g packet Take 17 g by mouth daily as needed for mild constipation.  . pravastatin (PRAVACHOL) 10 MG tablet Take 1 tablet (10 mg total) by mouth daily at 6 PM.  . senna-docusate (SENOKOT-S) 8.6-50 MG tablet Take 1 tablet by mouth 2 (two) times daily.  . tamsulosin (FLOMAX) 0.4 MG CAPS capsule Take 1 capsule (0.4 mg total) by mouth daily after breakfast.  . [DISCONTINUED] aspirin EC 81 MG tablet Take 81 mg by mouth daily.  . [DISCONTINUED] methimazole (TAPAZOLE) 10 MG tablet Take 2 tablets (20 mg total) by mouth 3 (three) times daily. (Patient taking differently: Take 20 mg by mouth every 8  (eight) hours. )  . [DISCONTINUED] simvastatin (ZOCOR) 20 MG tablet Take 1 tablet (20 mg total) by mouth every evening.   No facility-administered encounter medications on file as of 03/23/2019.     SIGNIFICANT DIAGNOSTIC EXAMS  TODAY  02-13-19: 2-d echo:  1. Left ventricular ejection fraction, by visual estimation, is 60 to  65%. The left ventricle has normal function. There is moderately increased  left ventricular hypertrophy.   02-27-19: renal ultrasound:  1. No evidence of hydronephrosis involving either kidney to suggest urinary tract obstruction. 2. Mild diffuse cortical thinning involving both kidneys. No focal abnormality involving either kidney and no visible shadowing calculi.  LABS REVIEWED TODAY  02-11-19: wbc 12.9; hgb 13.8; hct 42.5; mcv 92.6 plt 192; glucose 131; bun 43; creat 1.71; k+ 4.7; na++ 145; ca 8.8; liver normal albumin 3.4 tsh 0.026 02-12-19: free t3: 3.4 free t4: 1.76 02-14-19: digoxin 0.9 02-23-19: tsh 0.056 free t3: 2.15 03-02-19: glucose 116; bun 22; creat 1.41; k+ 3.8; na++ 131; ca 7.9; liver normal albumin 1.8 d-dimer 2.3 03-12-19: wbc 7.3; hgb 7.9; hct 24.9; mcv 92.9 plt 218; glucose 100; bun 20; creat 1.70; k+ 3.7; na++ 138; ca 8.1   Review of Systems  Constitutional: Negative for malaise/fatigue.  Respiratory: Negative for cough and shortness of breath.   Cardiovascular: Negative for chest pain, palpitations and leg swelling.  Gastrointestinal: Negative for abdominal pain, constipation and heartburn.  Musculoskeletal: Negative for back pain, joint pain and myalgias.  Skin: Negative.   Neurological: Negative for dizziness.  Psychiatric/Behavioral: The patient is not nervous/anxious.     Physical Exam Constitutional:      General: He is not in acute distress.    Appearance: He is well-developed. He is not diaphoretic.  Neck:     Thyroid: No thyromegaly.  Cardiovascular:     Rate and Rhythm: Normal rate. Rhythm irregular.     Pulses: Normal pulses.      Heart sounds: Normal heart sounds.  Pulmonary:     Effort: Pulmonary effort is normal. No respiratory distress.     Breath sounds: Normal breath sounds.  Abdominal:     General: Bowel sounds are normal. There is no distension.     Palpations: Abdomen is soft.     Tenderness: There is no abdominal tenderness.  Musculoskeletal:        General: Normal range of motion.     Cervical back: Neck supple.     Right lower leg: No edema.     Left lower leg: No edema.  Lymphadenopathy:     Cervical: No cervical adenopathy.  Skin:    General: Skin is warm and dry.  Neurological:     Mental Status: He is alert. Mental status is at baseline.  Psychiatric:        Mood and Affect: Mood normal.     ASSESSMENT/ PLAN:  TODAY  1. Atrial fibrillation with RVR: heart rate is stable. Will continue amiodarone 200 mg twice daily  cardizem cd 360; lopressor 200 mg twice daily for rate control digoxin 0.125 mg; eliquis 5 mg twice daily   2. Coronary artery disease of native artery of native heart without angina: is stable will continue lopressor 200 mg twice daily has prn ntg.   3. Essential hypertension: is stable b/p 138/65 will continue lopressor 200 mg twice daily cardizem cd 360 mg daily   4. SBO (small bowel obstruction) is stable will continue miralax daily as needed  Senna s twice daily   5. Hyperthyroidism: is stable tsh 0.056 free t3: 2.15 will continue tapazole 10 mg every 8 hours  6. Mixed hyperlipidemia: is stable will continue pravachol 10 mg daily   7. Benign prostatic hyperplasia without urinary symptoms: is stable will continue flomax 0.4 mg daily prescar 5 mg daily   Has urinary retention: will reinsert foley as he has failed voiding trial.   8. GERD without esophagitis: is stable  will continue protonix 40 mg twice daily   9. Protein calorie malnutrition: is without stable albumin 1.8 will continue supplement daily will begin prostat 30 cc three times daily       MD is  aware of resident's narcotic use and is in agreement with current plan of care. We will attempt to wean resident as appropriate.  Derek Edwards NP [facility]  Contact [phone] Monday through Friday 8am- 5pm  After hours call [phone]

## 2019-03-28 DIAGNOSIS — K219 Gastro-esophageal reflux disease without esophagitis: Secondary | ICD-10-CM | POA: Insufficient documentation

## 2019-03-28 DIAGNOSIS — E43 Unspecified severe protein-calorie malnutrition: Secondary | ICD-10-CM | POA: Insufficient documentation

## 2019-03-28 DIAGNOSIS — N400 Enlarged prostate without lower urinary tract symptoms: Secondary | ICD-10-CM | POA: Insufficient documentation

## 2019-03-30 ENCOUNTER — Other Ambulatory Visit: Payer: Self-pay | Admitting: Adult Health

## 2019-03-30 ENCOUNTER — Non-Acute Institutional Stay (SKILLED_NURSING_FACILITY): Payer: PPO | Admitting: Adult Health

## 2019-03-30 ENCOUNTER — Encounter: Payer: Self-pay | Admitting: Adult Health

## 2019-03-30 DIAGNOSIS — K56609 Unspecified intestinal obstruction, unspecified as to partial versus complete obstruction: Secondary | ICD-10-CM | POA: Diagnosis not present

## 2019-03-30 DIAGNOSIS — I10 Essential (primary) hypertension: Secondary | ICD-10-CM

## 2019-03-30 DIAGNOSIS — I4891 Unspecified atrial fibrillation: Secondary | ICD-10-CM | POA: Diagnosis not present

## 2019-03-30 DIAGNOSIS — R310 Gross hematuria: Secondary | ICD-10-CM | POA: Diagnosis not present

## 2019-03-30 DIAGNOSIS — R338 Other retention of urine: Secondary | ICD-10-CM | POA: Diagnosis not present

## 2019-03-30 MED ORDER — AMIODARONE HCL 200 MG PO TABS: 200.0000 mg | ORAL_TABLET | Freq: Two times a day (BID) | ORAL | 0 refills | Status: DC

## 2019-03-30 MED ORDER — APIXABAN 5 MG PO TABS: 5.0000 mg | ORAL_TABLET | Freq: Two times a day (BID) | ORAL | 0 refills | Status: DC

## 2019-03-30 MED ORDER — NITROGLYCERIN 0.4 MG SL SUBL: 0.4000 mg | SUBLINGUAL_TABLET | SUBLINGUAL | 0 refills | Status: DC | PRN

## 2019-03-30 MED ORDER — DIGOXIN 125 MCG PO TABS: 0.1250 mg | ORAL_TABLET | Freq: Every day | ORAL | 0 refills | Status: DC

## 2019-03-30 MED ORDER — PRAVASTATIN SODIUM 10 MG PO TABS: 10.0000 mg | ORAL_TABLET | Freq: Every day | ORAL | 0 refills | Status: DC

## 2019-03-30 MED ORDER — PANTOPRAZOLE SODIUM 40 MG PO TBEC: 40.0000 mg | DELAYED_RELEASE_TABLET | Freq: Two times a day (BID) | ORAL | 0 refills | Status: DC

## 2019-03-30 MED ORDER — BENZONATATE 100 MG PO CAPS: 100.0000 mg | ORAL_CAPSULE | Freq: Three times a day (TID) | ORAL | 0 refills | Status: DC | PRN

## 2019-03-30 MED ORDER — DILTIAZEM HCL ER COATED BEADS 360 MG PO CP24: 360.0000 mg | ORAL_CAPSULE | Freq: Every day | ORAL | 0 refills | Status: DC

## 2019-03-30 MED ORDER — METHIMAZOLE 10 MG PO TABS: 10.0000 mg | ORAL_TABLET | Freq: Three times a day (TID) | ORAL | 0 refills | Status: DC

## 2019-03-30 MED ORDER — METOPROLOL TARTRATE 100 MG PO TABS: 200.0000 mg | ORAL_TABLET | Freq: Two times a day (BID) | ORAL | 0 refills | Status: DC

## 2019-03-30 MED ORDER — FINASTERIDE 5 MG PO TABS: 5.0000 mg | ORAL_TABLET | Freq: Every day | ORAL | 0 refills | Status: DC

## 2019-03-30 MED ORDER — TAMSULOSIN HCL 0.4 MG PO CAPS: 0.4000 mg | ORAL_CAPSULE | Freq: Every day | ORAL | 0 refills | Status: DC

## 2019-03-30 NOTE — Progress Notes (Signed)
Location:    [place] Room Number: 133/P Place of Service:  SNF (31)    CODE STATUS: DNR  Allergies  Allergen Reactions  . Indomethacin Anaphylaxis    But tolerates ibuprofen, aleve  . Iodine-131 Anaphylaxis    Chief Complaint  Patient presents with  . Discharge Note    Discharge Visit    HPI:  He is being discharged to home with home health for pt/ot. He will not need dme. He will need his prescriptions written and will need to follow up with his medical provider. He had been hospitalized for an SBO. He was admitted to this facility for short term rehab and is now ready for discharge to home.    Past Medical History:  Diagnosis Date  . Acute myocardial infarction, unspecified site, episode of care unspecified   . CAD in native artery    a. cath 01/27/2009 : s/p promus DES to LAD and medical managment of 70-80% mRCA  . Edema   . HTN (hypertension)   . Mixed hyperlipidemia   . Unspecified hypothyroidism     Past Surgical History:  Procedure Laterality Date  . CHOLECYSTECTOMY    . CORONARY STENT PLACEMENT    . CYSTOSCOPY/RETROGRADE/URETEROSCOPY Bilateral 03/09/2019   Procedure: CYSTOSCOPY, Clot Evacuation, Fulguration;  Surgeon: Festus Aloe, MD;  Location: [place];  Service: Urology;  Laterality: Bilateral;  . HERNIA REPAIR      Social History   Socioeconomic History  . Marital status: Married    Spouse name: Not on file  . Number of children: Not on file  . Years of education: Not on file  . Highest education level: Not on file  Occupational History  . Occupation: Charity fundraiser  Tobacco Use  . Smoking status: Former Smoker    Quit date: 01/20/1957    Years since quitting: 62.2  . Smokeless tobacco: Never Used  Substance and Sexual Activity  . Alcohol use: No  . Drug use: Never  . Sexual activity: Not on file  Other Topics Concern  . Not on file  Social History Narrative   Former smoker.   Social Determinants of Health    Financial Resource Strain:   . Difficulty of Paying Living Expenses: Not on file  Food Insecurity:   . Worried About Charity fundraiser in the Last Year: Not on file  . Ran Out of Food in the Last Year: Not on file  Transportation Needs:   . Lack of Transportation (Medical): Not on file  . Lack of Transportation (Non-Medical): Not on file  Physical Activity:   . Days of Exercise per Week: Not on file  . Minutes of Exercise per Session: Not on file  Stress:   . Feeling of Stress : Not on file  Social Connections:   . Frequency of Communication with Friends and Family: Not on file  . Frequency of Social Gatherings with Friends and Family: Not on file  . Attends Religious Services: Not on file  . Active Member of Clubs or Organizations: Not on file  . Attends Archivist Meetings: Not on file  . Marital Status: Not on file  Intimate Partner Violence:   . Fear of Current or Ex-Partner: Not on file  . Emotionally Abused: Not on file  . Physically Abused: Not on file  . Sexually Abused: Not on file   Family History  Problem Relation Age of Onset  . Heart attack Father   . Lung cancer Sister  VITAL SIGNS BP 109/60   Pulse 73   Temp (!) 97.2 F (36.2 C) (Oral)   Resp 20   Ht 6\' 1"  (1.854 m)   Wt 273 lb (123.8 kg)   BMI 36.02 kg/m   Patient's Medications  New Prescriptions   No medications on file  Previous Medications   AMINO ACIDS-PROTEIN HYDROLYS (FEEDING SUPPLEMENT, PRO-STAT SUGAR FREE 64,) LIQD    Take 30 mLs by mouth 3 (three) times daily with meals.   AMIODARONE (PACERONE) 200 MG TABLET    Take 200 mg by mouth daily.   APIXABAN (ELIQUIS) 5 MG TABS TABLET    Take 1 tablet (5 mg total) by mouth 2 (two) times daily.   BENZONATATE (TESSALON) 100 MG CAPSULE    Take 1 capsule (100 mg total) by mouth every 8 (eight) hours as needed for cough.   BISACODYL (DULCOLAX) 10 MG SUPPOSITORY    Place 1 suppository (10 mg total) rectally daily as needed for severe  constipation.   COENZYME Q10 200 MG CAPSULE    Take 200 mg by mouth daily.     DEXTROMETHORPHAN 15 MG/5ML SYRUP    Take 15 mLs by mouth every 6 (six) hours as needed for cough.   DIGOXIN (LANOXIN) 0.125 MG TABLET    Take 1 tablet (0.125 mg total) by mouth daily.   DILTIAZEM (CARDIZEM CD) 360 MG 24 HR CAPSULE    Take 1 capsule (360 mg total) by mouth daily.   FINASTERIDE (PROSCAR) 5 MG TABLET    Take 1 tablet (5 mg total) by mouth daily.   FISH OIL-OMEGA-3 FATTY ACIDS 1000 MG CAPSULE    Take 2 g by mouth daily.     METHIMAZOLE (TAPAZOLE) 10 MG TABLET    Take 20 mg by mouth every 8 (eight) hours.   METOPROLOL TARTRATE (LOPRESSOR) 100 MG TABLET    Take 2 tablets (200 mg total) by mouth 2 (two) times daily.   MULTIPLE VITAMIN (MULTIVITAMIN) TABLET    Take 1 tablet by mouth daily.     NITROGLYCERIN (NITROSTAT) 0.4 MG SL TABLET    Place 1 tablet (0.4 mg total) under the tongue every 5 (five) minutes as needed.   ONDANSETRON (ZOFRAN-ODT) 4 MG DISINTEGRATING TABLET    Take 4 mg by mouth every 6 (six) hours as needed for nausea or vomiting.   PANTOPRAZOLE (PROTONIX) 40 MG TABLET    Take 1 tablet (40 mg total) by mouth 2 (two) times daily.   POLYETHYLENE GLYCOL (MIRALAX / GLYCOLAX) 17 G PACKET    Take 17 g by mouth daily as needed for mild constipation.   PRAVASTATIN (PRAVACHOL) 10 MG TABLET    Take 1 tablet (10 mg total) by mouth daily at 6 PM.   SENNA-DOCUSATE (SENOKOT-S) 8.6-50 MG TABLET    Take 1 tablet by mouth 2 (two) times daily.   TAMSULOSIN (FLOMAX) 0.4 MG CAPS CAPSULE    Take 1 capsule (0.4 mg total) by mouth daily after breakfast.  Modified Medications   No medications on file  Discontinued Medications   AMIODARONE (PACERONE) 200 MG TABLET    Take 1 tablet (200 mg total) by mouth 2 (two) times daily.   FEEDING SUPPLEMENT, ENSURE ENLIVE, (ENSURE ENLIVE) LIQD    Take 237 mLs by mouth daily in the afternoon.   METHIMAZOLE (TAPAZOLE) 10 MG TABLET    Take 1 tablet (10 mg total) by mouth every 8  (eight) hours.     SIGNIFICANT DIAGNOSTIC EXAMS   PREVIOUS  02-13-19: 2-d echo:  1. Left ventricular ejection fraction, by visual estimation, is 60 to  65%. The left ventricle has normal function. There is moderately increased  left ventricular hypertrophy.   02-27-19: renal ultrasound:  1. No evidence of hydronephrosis involving either kidney to suggest urinary tract obstruction. 2. Mild diffuse cortical thinning involving both kidneys. No focal abnormality involving either kidney and no visible shadowing calculi.  NO NEW EXAMS  LABS REVIEWED PREVOIUS  02-11-19: wbc 12.9; hgb 13.8; hct 42.5; mcv 92.6 plt 192; glucose 131; bun 43; creat 1.71; k+ 4.7; na++ 145; ca 8.8; liver normal albumin 3.4 tsh 0.026 02-12-19: free t3: 3.4 free t4: 1.76 02-14-19: digoxin 0.9 02-23-19: tsh 0.056 free t3: 2.15 03-02-19: glucose 116; bun 22; creat 1.41; k+ 3.8; na++ 131; ca 7.9; liver normal albumin 1.8 d-dimer 2.3 03-12-19: wbc 7.3; hgb 7.9; hct 24.9; mcv 92.9 plt 218; glucose 100; bun 20; creat 1.70; k+ 3.7; na++ 138; ca 8.1   NO NEW LABS.   Review of Systems  Constitutional: Negative for malaise/fatigue.  Respiratory: Negative for cough and shortness of breath.   Cardiovascular: Negative for chest pain, palpitations and leg swelling.  Gastrointestinal: Negative for abdominal pain, constipation and heartburn.  Musculoskeletal: Negative for back pain, joint pain and myalgias.  Skin: Negative.   Neurological: Negative for dizziness.  Psychiatric/Behavioral: The patient is not nervous/anxious.    Physical Exam Constitutional:      General: He is not in acute distress.    Appearance: He is well-developed. He is not diaphoretic.  Neck:     Thyroid: No thyromegaly.  Cardiovascular:     Rate and Rhythm: Normal rate. Rhythm irregular.     Pulses: Normal pulses.     Heart sounds: Normal heart sounds.  Pulmonary:     Effort: Pulmonary effort is normal. No respiratory distress.     Breath sounds:  Normal breath sounds.  Abdominal:     General: Bowel sounds are normal. There is no distension.     Palpations: Abdomen is soft.     Tenderness: There is no abdominal tenderness.  Musculoskeletal:        General: Normal range of motion.     Cervical back: Neck supple.     Right lower leg: No edema.     Left lower leg: No edema.  Lymphadenopathy:     Cervical: No cervical adenopathy.  Skin:    General: Skin is warm and dry.  Neurological:     Mental Status: He is alert. Mental status is at baseline.  Psychiatric:        Mood and Affect: Mood normal.       ASSESSMENT/ PLAN:   Patient is being discharged with the following home health services:  Pt/ot to evaluate and treat as indicated for gait balance strength adl training.   Patient is being discharged with the following durable medical equipment:  None needed   Patient has been advised to f/u with their PCP in 1-2 weeks to bring them up to date on their rehab stay.  Social services at facility was responsible for arranging this appointment.  Pt was provided with a 30 day supply of prescriptions for medications and refills must be obtained from their PCP.  For controlled substances, a more limited supply may be provided adequate until PCP appointment only.   A 30 day supply of his prescription medications have been sent ot [facility]  Time spent with patient 35 minutes: dme; medications home health.  Ok Edwards NP [facility]  Contact [phone] Monday through Friday 8am- 5pm  After hours call [phone]

## 2019-04-04 DIAGNOSIS — E0590 Thyrotoxicosis, unspecified without thyrotoxic crisis or storm: Secondary | ICD-10-CM | POA: Diagnosis not present

## 2019-04-05 DIAGNOSIS — N400 Enlarged prostate without lower urinary tract symptoms: Secondary | ICD-10-CM | POA: Diagnosis not present

## 2019-04-05 DIAGNOSIS — Z466 Encounter for fitting and adjustment of urinary device: Secondary | ICD-10-CM | POA: Diagnosis not present

## 2019-04-05 DIAGNOSIS — I4891 Unspecified atrial fibrillation: Secondary | ICD-10-CM | POA: Diagnosis not present

## 2019-04-05 DIAGNOSIS — I10 Essential (primary) hypertension: Secondary | ICD-10-CM | POA: Diagnosis not present

## 2019-04-05 DIAGNOSIS — Z48816 Encounter for surgical aftercare following surgery on the genitourinary system: Secondary | ICD-10-CM | POA: Diagnosis not present

## 2019-04-05 DIAGNOSIS — E785 Hyperlipidemia, unspecified: Secondary | ICD-10-CM | POA: Diagnosis not present

## 2019-04-05 DIAGNOSIS — Z7901 Long term (current) use of anticoagulants: Secondary | ICD-10-CM | POA: Diagnosis not present

## 2019-04-05 DIAGNOSIS — E46 Unspecified protein-calorie malnutrition: Secondary | ICD-10-CM | POA: Diagnosis not present

## 2019-04-05 DIAGNOSIS — K219 Gastro-esophageal reflux disease without esophagitis: Secondary | ICD-10-CM | POA: Diagnosis not present

## 2019-04-05 DIAGNOSIS — Z8616 Personal history of COVID-19: Secondary | ICD-10-CM | POA: Diagnosis not present

## 2019-04-05 DIAGNOSIS — I480 Paroxysmal atrial fibrillation: Secondary | ICD-10-CM | POA: Diagnosis not present

## 2019-04-05 DIAGNOSIS — E039 Hypothyroidism, unspecified: Secondary | ICD-10-CM | POA: Diagnosis not present

## 2019-04-05 DIAGNOSIS — I2510 Atherosclerotic heart disease of native coronary artery without angina pectoris: Secondary | ICD-10-CM | POA: Diagnosis not present

## 2019-04-06 DIAGNOSIS — E782 Mixed hyperlipidemia: Secondary | ICD-10-CM | POA: Diagnosis not present

## 2019-04-06 DIAGNOSIS — Z6841 Body Mass Index (BMI) 40.0 and over, adult: Secondary | ICD-10-CM | POA: Diagnosis not present

## 2019-04-06 DIAGNOSIS — Z8616 Personal history of COVID-19: Secondary | ICD-10-CM | POA: Diagnosis not present

## 2019-04-06 DIAGNOSIS — D62 Acute posthemorrhagic anemia: Secondary | ICD-10-CM | POA: Diagnosis not present

## 2019-04-06 DIAGNOSIS — I2510 Atherosclerotic heart disease of native coronary artery without angina pectoris: Secondary | ICD-10-CM | POA: Diagnosis not present

## 2019-04-06 DIAGNOSIS — K565 Intestinal adhesions [bands], unspecified as to partial versus complete obstruction: Secondary | ICD-10-CM | POA: Diagnosis not present

## 2019-04-06 DIAGNOSIS — I10 Essential (primary) hypertension: Secondary | ICD-10-CM | POA: Diagnosis not present

## 2019-04-06 DIAGNOSIS — N400 Enlarged prostate without lower urinary tract symptoms: Secondary | ICD-10-CM | POA: Diagnosis not present

## 2019-04-06 DIAGNOSIS — R000 Tachycardia, unspecified: Secondary | ICD-10-CM | POA: Diagnosis not present

## 2019-04-06 DIAGNOSIS — I480 Paroxysmal atrial fibrillation: Secondary | ICD-10-CM | POA: Diagnosis not present

## 2019-04-06 DIAGNOSIS — E0590 Thyrotoxicosis, unspecified without thyrotoxic crisis or storm: Secondary | ICD-10-CM | POA: Diagnosis not present

## 2019-04-06 DIAGNOSIS — T83098D Other mechanical complication of other indwelling urethral catheter, subsequent encounter: Secondary | ICD-10-CM | POA: Diagnosis not present

## 2019-04-06 DIAGNOSIS — Z712 Person consulting for explanation of examination or test findings: Secondary | ICD-10-CM | POA: Diagnosis not present

## 2019-04-06 DIAGNOSIS — R05 Cough: Secondary | ICD-10-CM | POA: Diagnosis not present

## 2019-04-06 DIAGNOSIS — R7301 Impaired fasting glucose: Secondary | ICD-10-CM | POA: Diagnosis not present

## 2019-04-06 DIAGNOSIS — Z23 Encounter for immunization: Secondary | ICD-10-CM | POA: Diagnosis not present

## 2019-04-06 DIAGNOSIS — R319 Hematuria, unspecified: Secondary | ICD-10-CM | POA: Diagnosis not present

## 2019-04-06 DIAGNOSIS — R972 Elevated prostate specific antigen [PSA]: Secondary | ICD-10-CM | POA: Diagnosis not present

## 2019-04-06 DIAGNOSIS — E039 Hypothyroidism, unspecified: Secondary | ICD-10-CM | POA: Diagnosis not present

## 2019-04-07 ENCOUNTER — Encounter: Payer: Self-pay | Admitting: Cardiology

## 2019-04-07 ENCOUNTER — Telehealth (INDEPENDENT_AMBULATORY_CARE_PROVIDER_SITE_OTHER): Payer: PPO | Admitting: Cardiology

## 2019-04-07 VITALS — BP 151/79 | HR 91 | Ht 73.0 in

## 2019-04-07 DIAGNOSIS — K56609 Unspecified intestinal obstruction, unspecified as to partial versus complete obstruction: Secondary | ICD-10-CM

## 2019-04-07 DIAGNOSIS — I4891 Unspecified atrial fibrillation: Secondary | ICD-10-CM | POA: Diagnosis not present

## 2019-04-07 DIAGNOSIS — E0590 Thyrotoxicosis, unspecified without thyrotoxic crisis or storm: Secondary | ICD-10-CM

## 2019-04-07 DIAGNOSIS — I2510 Atherosclerotic heart disease of native coronary artery without angina pectoris: Secondary | ICD-10-CM

## 2019-04-07 DIAGNOSIS — Z7901 Long term (current) use of anticoagulants: Secondary | ICD-10-CM | POA: Insufficient documentation

## 2019-04-07 DIAGNOSIS — I10 Essential (primary) hypertension: Secondary | ICD-10-CM

## 2019-04-07 MED ORDER — METOPROLOL TARTRATE 100 MG PO TABS: 100.0000 mg | ORAL_TABLET | Freq: Two times a day (BID) | ORAL | 1 refills | Status: DC

## 2019-04-07 NOTE — Progress Notes (Signed)
Virtual Visit via Telephone Note   This visit type was conducted due to national recommendations for restrictions regarding the COVID-19 Pandemic (e.g. social distancing) in an effort to limit this patient's exposure and mitigate transmission in our community.  Due to his co-morbid illnesses, this patient is at least at moderate risk for complications without adequate follow up.  This format is felt to be most appropriate for this patient at this time.  The patient did not have access to video technology/had technical difficulties with video requiring transitioning to audio format only (telephone).  All issues noted in this document were discussed and addressed.  No physical exam could be performed with this format.  Please refer to the patient's chart for his  consent to telehealth for [facility].   The patient was identified using 2 identifiers.  Date:  04/07/2019   ID:  [name], DOB 1935/11/19, MRN [id number]  Patient Location: Home Provider Location: Home  PCP:  Celene Squibb, MD  Cardiologist:  Skeet Latch, MD  Electrophysiologist:  None   Evaluation Performed:  Follow-Up Visit  Chief Complaint:  none  History of Present Illness:    Derek Foster is a 84 y.o. male with a history of coronary disease.  He had an anterior MI in 2014 treated with an LAD PCI.  Other past medical history is remarkable for history of hypertension and dyslipidemia.  In January 2021 he was admitted with a small bowel obstruction and new onset atrial fibrillation in the setting of active COVID-19.  He had recently lost his wife to COVID-19 prior to that admission.  Cardiology was consulted.  He required multiple medications for rate control and it turns out he was hyperthyroid.  Echocardiogram during that admission revealed an ejection fraction of 60 to 65% with moderate LVH.  He was discharged on amiodarone, Lanoxin, metoprolol 200 mg twice daily, and diltiazem 360 mg daily.  He was also placed on  Eliquis.  He did have acute renal insufficiency and gross hematuria during that hospitalization but that improved by discharge. Dr Oval Linsey saw the patient in follow up in 03/16/2019 and decreased his Amiodarone to 200 mg daily.  The plan was for eventual DCCV in 3 months depending on his thyroid function.   When he was discharged he was sent to a rehab facility.  He was recently sent home from rehab and the daughter contacted [place] today with some concerns about his medications.  She tells me the patient has done well since he was discharged.  She did note that his heart rate at times drops into the 50s.  Patient has followed up with his primary care provider who has checked followed up TFTs and other lab work.  The patient does not have symptoms concerning for COVID-19 infection (fever, chills, cough, or new shortness of breath).    Past Medical History:  Diagnosis Date  . Acute myocardial infarction, unspecified site, episode of care unspecified   . CAD in native artery    a. cath 01/27/2009 : s/p promus DES to LAD and medical managment of 70-80% mRCA  . Edema   . HTN (hypertension)   . Mixed hyperlipidemia   . Unspecified hypothyroidism    Past Surgical History:  Procedure Laterality Date  . CHOLECYSTECTOMY    . CORONARY STENT PLACEMENT    . CYSTOSCOPY/RETROGRADE/URETEROSCOPY Bilateral 03/09/2019   Procedure: CYSTOSCOPY, Clot Evacuation, Fulguration;  Surgeon: Festus Aloe, MD;  Location: [facility];  Service: Urology;  Laterality: Bilateral;  .  HERNIA REPAIR       Current Meds  Medication Sig  . Amino Acids-Protein Hydrolys (FEEDING SUPPLEMENT, PRO-STAT SUGAR FREE 64,) LIQD Take 30 mLs by mouth 3 (three) times daily with meals.  Marland Kitchen amiodarone (PACERONE) 200 MG tablet Take 200 mg by mouth daily.  Marland Kitchen apixaban (ELIQUIS) 5 MG TABS tablet Take 1 tablet (5 mg total) by mouth 2 (two) times daily.  . benzonatate (TESSALON) 100 MG capsule Take 1 capsule (100 mg total) by mouth every 8 (eight) hours  as needed for cough.  . bisacodyl (DULCOLAX) 10 MG suppository Place 1 suppository (10 mg total) rectally daily as needed for severe constipation.  . Coenzyme Q10 200 MG capsule Take 200 mg by mouth daily.    Marland Kitchen dextromethorphan 15 MG/5ML syrup Take 15 mLs by mouth every 6 (six) hours as needed for cough.  . digoxin (LANOXIN) 0.125 MG tablet Take 1 tablet (0.125 mg total) by mouth daily.  Marland Kitchen diltiazem (CARDIZEM CD) 360 MG 24 hr capsule Take 1 capsule (360 mg total) by mouth daily.  . finasteride (PROSCAR) 5 MG tablet Take 1 tablet (5 mg total) by mouth daily.  . fish oil-omega-3 fatty acids 1000 MG capsule Take 2 g by mouth daily.    . furosemide (LASIX) 20 MG tablet Take 20 mg by mouth daily.  . methimazole (TAPAZOLE) 10 MG tablet Take 20 mg by mouth every 8 (eight) hours.  . metoprolol tartrate (LOPRESSOR) 100 MG tablet Take 2 tablets (200 mg total) by mouth 2 (two) times daily.  . Multiple Vitamin (MULTIVITAMIN) tablet Take 1 tablet by mouth daily.    . nitroGLYCERIN (NITROSTAT) 0.4 MG SL tablet Place 1 tablet (0.4 mg total) under the tongue every 5 (five) minutes as needed.  . ondansetron (ZOFRAN-ODT) 4 MG disintegrating tablet Take 4 mg by mouth every 6 (six) hours as needed for nausea or vomiting.  . pantoprazole (PROTONIX) 40 MG tablet Take 1 tablet (40 mg total) by mouth 2 (two) times daily.  . polyethylene glycol (MIRALAX / GLYCOLAX) 17 g packet Take 17 g by mouth daily as needed for mild constipation.  . potassium chloride (KLOR-CON) 10 MEQ tablet Take 10 mEq by mouth daily.  . pravastatin (PRAVACHOL) 10 MG tablet Take 1 tablet (10 mg total) by mouth daily at 6 PM.  . senna-docusate (SENOKOT-S) 8.6-50 MG tablet Take 1 tablet by mouth 2 (two) times daily.  . tamsulosin (FLOMAX) 0.4 MG CAPS capsule Take 1 capsule (0.4 mg total) by mouth daily after breakfast.     Allergies:   Indomethacin and Iodine-131   Social History   Tobacco Use  . Smoking status: Former Smoker    Quit date:  01/20/1957    Years since quitting: 62.2  . Smokeless tobacco: Never Used  Substance Use Topics  . Alcohol use: No  . Drug use: Never     Family Hx: The patient's family history includes Heart attack in his father; Lung cancer in his sister.  ROS:   Please see the history of present illness.    All other systems reviewed and are negative.   Prior CV studies:   The following studies were reviewed today: Echo 02/13/2019  Labs/Other Tests and Data Reviewed:    EKG:  An ECG dated 03/16/2019 was personally reviewed today and demonstrated:  AF with VR-85  Recent Labs: 03/03/2019: B Natriuretic Peptide 345.4 03/04/2019: ALT 23 03/07/2019: TSH 0.013 03/09/2019: Magnesium 1.8 03/12/2019: BUN 20; Creatinine, Ser 1.20; Hemoglobin 7.9; Platelets 218; Potassium  3.7; Sodium 138   Recent Lipid Panel Lab Results  Component Value Date/Time   CHOL 138 05/28/2011 10:30 AM   TRIG 81 05/28/2011 10:30 AM   HDL 43 05/28/2011 10:30 AM   CHOLHDL 3.2 05/28/2011 10:30 AM   LDLCALC 79 05/28/2011 10:30 AM    Wt Readings from Last 3 Encounters:  03/30/19 273 lb (123.8 kg)  03/23/19 273 lb (123.8 kg)  03/16/19 269 lb (122 kg)     Objective:    Vital Signs:  BP (!) 151/79   Pulse 91   Ht 6\' 1"  (1.854 m)   BMI 36.02 kg/m    VITAL SIGNS:  reviewed  ASSESSMENT & PLAN:    AF with RVR- New onset Jan 2021 in setting of COVID, SBO, and hyperthyroidism.  Pt discharged on Amiodarone (decreased to 200 mg daily 2/24) Diltiazem 360 mg, Metoprolol 200 mg BID, and Lanoxin 0.125 mg. His heart rate has recently been in the 50's.   Anticoagulated- On Eliquis  CAD- H/O MI- LAD PCI in 2014.- No recent angina  Hyperthyroidism- PCP following  Anemia- Hgb 7.9 in Feb- labs drawn recently  Plan: Stop digoxin.  Decrease Metoprolol to 100 mg BID.  Continue diltiazem 360 mg daily for now.  I'll do a f/u in 2 weeks to see if further adjustments are required. I'll request recent labs from PCP.  COVID-19  Education: The signs and symptoms of COVID-19 were discussed with the patient and how to seek care for testing (follow up with PCP or arrange E-visit).  The importance of social distancing was discussed today.  Time:   Today, I have spent 20 minutes with the patient with telehealth technology discussing the above problems.     Medication Adjustments/Labs and Tests Ordered: Current medicines are reviewed at length with the patient today.  Concerns regarding medicines are outlined above.   Tests Ordered: No orders of the defined types were placed in this encounter.   Medication Changes: No orders of the defined types were placed in this encounter.   Follow Up:  Virtual Visit  with me in two weeks  Signed, Kerin Ransom, PA-C  04/07/2019 8:56 AM    [place]

## 2019-04-07 NOTE — Patient Instructions (Signed)
Medication Instructions:   STOP Lanoxin (Digoxin).  Decrease Metoprolol to 100 mg twice daily.  *If you need a refill on your cardiac medications before your next appointment, please call your pharmacy*   Follow-Up: At [facility], you and your health needs are our priority.  As part of our continuing mission to provide you with exceptional heart care, we have created designated Provider Care Teams.  These Care Teams include your primary Cardiologist (physician) and Advanced Practice Providers (APPs -  Physician Assistants and Nurse Practitioners) who all work together to provide you with the care you need, when you need it.  We recommend signing up for the patient portal called "MyChart".  Sign up information is provided on this After Visit Summary.  MyChart is used to connect with patients for Virtual Visits (Telemedicine).  Patients are able to view lab/test results, encounter notes, upcoming appointments, etc.  Non-urgent messages can be sent to your provider as well.   To learn more about what you can do with MyChart, go to [email or web address].    Your next appointment:   You have been scheduled for a VIRTUAL VISIT with Kerin Ransom, PA on Thursday, 04/21/19 at 8:45 AM.  You have been scheduled for an IN OFFICE visit with Dr. Oval Linsey on Friday, 05/13/19 at 9:20 AM.

## 2019-04-07 NOTE — Addendum Note (Signed)
Addended by: Therisa Doyne on: 04/07/2019 02:12 PM   Modules accepted: Orders

## 2019-04-11 ENCOUNTER — Other Ambulatory Visit: Payer: Self-pay

## 2019-04-11 ENCOUNTER — Emergency Department (HOSPITAL_COMMUNITY): Payer: PPO

## 2019-04-11 ENCOUNTER — Inpatient Hospital Stay (HOSPITAL_COMMUNITY)
Admission: EM | Admit: 2019-04-11 | Discharge: 2019-04-14 | DRG: 698 | Disposition: A | Discharge status: Skilled Nursing Facility | Payer: PPO | Attending: Internal Medicine | Admitting: Internal Medicine

## 2019-04-11 ENCOUNTER — Encounter (HOSPITAL_COMMUNITY): Payer: Self-pay

## 2019-04-11 DIAGNOSIS — I2510 Atherosclerotic heart disease of native coronary artery without angina pectoris: Secondary | ICD-10-CM | POA: Diagnosis not present

## 2019-04-11 DIAGNOSIS — Z8616 Personal history of COVID-19: Secondary | ICD-10-CM | POA: Diagnosis not present

## 2019-04-11 DIAGNOSIS — M6281 Muscle weakness (generalized): Secondary | ICD-10-CM | POA: Diagnosis not present

## 2019-04-11 DIAGNOSIS — K5900 Constipation, unspecified: Secondary | ICD-10-CM | POA: Diagnosis not present

## 2019-04-11 DIAGNOSIS — J189 Pneumonia, unspecified organism: Secondary | ICD-10-CM | POA: Diagnosis present

## 2019-04-11 DIAGNOSIS — R52 Pain, unspecified: Secondary | ICD-10-CM | POA: Diagnosis not present

## 2019-04-11 DIAGNOSIS — Z7901 Long term (current) use of anticoagulants: Secondary | ICD-10-CM | POA: Diagnosis not present

## 2019-04-11 DIAGNOSIS — N400 Enlarged prostate without lower urinary tract symptoms: Secondary | ICD-10-CM | POA: Diagnosis present

## 2019-04-11 DIAGNOSIS — I10 Essential (primary) hypertension: Secondary | ICD-10-CM | POA: Diagnosis present

## 2019-04-11 DIAGNOSIS — K219 Gastro-esophageal reflux disease without esophagitis: Secondary | ICD-10-CM | POA: Diagnosis present

## 2019-04-11 DIAGNOSIS — M549 Dorsalgia, unspecified: Secondary | ICD-10-CM | POA: Diagnosis not present

## 2019-04-11 DIAGNOSIS — R279 Unspecified lack of coordination: Secondary | ICD-10-CM | POA: Diagnosis not present

## 2019-04-11 DIAGNOSIS — Z888 Allergy status to other drugs, medicaments and biological substances status: Secondary | ICD-10-CM

## 2019-04-11 DIAGNOSIS — R0602 Shortness of breath: Secondary | ICD-10-CM | POA: Diagnosis not present

## 2019-04-11 DIAGNOSIS — Z20822 Contact with and (suspected) exposure to covid-19: Secondary | ICD-10-CM | POA: Diagnosis present

## 2019-04-11 DIAGNOSIS — Z95828 Presence of other vascular implants and grafts: Secondary | ICD-10-CM | POA: Diagnosis not present

## 2019-04-11 DIAGNOSIS — Z8249 Family history of ischemic heart disease and other diseases of the circulatory system: Secondary | ICD-10-CM | POA: Diagnosis not present

## 2019-04-11 DIAGNOSIS — I480 Paroxysmal atrial fibrillation: Secondary | ICD-10-CM | POA: Diagnosis not present

## 2019-04-11 DIAGNOSIS — Z741 Need for assistance with personal care: Secondary | ICD-10-CM | POA: Diagnosis not present

## 2019-04-11 DIAGNOSIS — E039 Hypothyroidism, unspecified: Secondary | ICD-10-CM | POA: Diagnosis not present

## 2019-04-11 DIAGNOSIS — E0789 Other specified disorders of thyroid: Secondary | ICD-10-CM | POA: Diagnosis not present

## 2019-04-11 DIAGNOSIS — R531 Weakness: Secondary | ICD-10-CM | POA: Diagnosis not present

## 2019-04-11 DIAGNOSIS — Y95 Nosocomial condition: Secondary | ICD-10-CM | POA: Diagnosis present

## 2019-04-11 DIAGNOSIS — Z955 Presence of coronary angioplasty implant and graft: Secondary | ICD-10-CM | POA: Diagnosis not present

## 2019-04-11 DIAGNOSIS — J90 Pleural effusion, not elsewhere classified: Secondary | ICD-10-CM | POA: Diagnosis not present

## 2019-04-11 DIAGNOSIS — R05 Cough: Secondary | ICD-10-CM | POA: Diagnosis not present

## 2019-04-11 DIAGNOSIS — N390 Urinary tract infection, site not specified: Secondary | ICD-10-CM | POA: Diagnosis not present

## 2019-04-11 DIAGNOSIS — M5416 Radiculopathy, lumbar region: Secondary | ICD-10-CM | POA: Diagnosis not present

## 2019-04-11 DIAGNOSIS — R600 Localized edema: Secondary | ICD-10-CM | POA: Diagnosis present

## 2019-04-11 DIAGNOSIS — Z515 Encounter for palliative care: Secondary | ICD-10-CM | POA: Diagnosis present

## 2019-04-11 DIAGNOSIS — Z79899 Other long term (current) drug therapy: Secondary | ICD-10-CM

## 2019-04-11 DIAGNOSIS — M5417 Radiculopathy, lumbosacral region: Secondary | ICD-10-CM | POA: Diagnosis not present

## 2019-04-11 DIAGNOSIS — B964 Proteus (mirabilis) (morganii) as the cause of diseases classified elsewhere: Secondary | ICD-10-CM | POA: Diagnosis not present

## 2019-04-11 DIAGNOSIS — Z743 Need for continuous supervision: Secondary | ICD-10-CM | POA: Diagnosis not present

## 2019-04-11 DIAGNOSIS — Z66 Do not resuscitate: Secondary | ICD-10-CM | POA: Diagnosis present

## 2019-04-11 DIAGNOSIS — N4289 Other specified disorders of prostate: Secondary | ICD-10-CM | POA: Diagnosis not present

## 2019-04-11 DIAGNOSIS — E782 Mixed hyperlipidemia: Secondary | ICD-10-CM | POA: Diagnosis not present

## 2019-04-11 DIAGNOSIS — R609 Edema, unspecified: Secondary | ICD-10-CM | POA: Diagnosis not present

## 2019-04-11 DIAGNOSIS — T83511A Infection and inflammatory reaction due to indwelling urethral catheter, initial encounter: Principal | ICD-10-CM | POA: Diagnosis present

## 2019-04-11 DIAGNOSIS — I252 Old myocardial infarction: Secondary | ICD-10-CM

## 2019-04-11 DIAGNOSIS — I4891 Unspecified atrial fibrillation: Secondary | ICD-10-CM | POA: Diagnosis present

## 2019-04-11 DIAGNOSIS — Y846 Urinary catheterization as the cause of abnormal reaction of the patient, or of later complication, without mention of misadventure at the time of the procedure: Secondary | ICD-10-CM | POA: Diagnosis present

## 2019-04-11 DIAGNOSIS — L89322 Pressure ulcer of left buttock, stage 2: Secondary | ICD-10-CM | POA: Diagnosis present

## 2019-04-11 DIAGNOSIS — R2689 Other abnormalities of gait and mobility: Secondary | ICD-10-CM | POA: Diagnosis not present

## 2019-04-11 DIAGNOSIS — M5410 Radiculopathy, site unspecified: Secondary | ICD-10-CM

## 2019-04-11 DIAGNOSIS — R262 Difficulty in walking, not elsewhere classified: Secondary | ICD-10-CM | POA: Diagnosis not present

## 2019-04-11 DIAGNOSIS — Z978 Presence of other specified devices: Secondary | ICD-10-CM | POA: Diagnosis not present

## 2019-04-11 DIAGNOSIS — Z87891 Personal history of nicotine dependence: Secondary | ICD-10-CM | POA: Diagnosis not present

## 2019-04-11 DIAGNOSIS — E785 Hyperlipidemia, unspecified: Secondary | ICD-10-CM | POA: Diagnosis not present

## 2019-04-11 DIAGNOSIS — M48061 Spinal stenosis, lumbar region without neurogenic claudication: Secondary | ICD-10-CM | POA: Diagnosis not present

## 2019-04-11 DIAGNOSIS — F4329 Adjustment disorder with other symptoms: Secondary | ICD-10-CM | POA: Diagnosis not present

## 2019-04-11 DIAGNOSIS — M5489 Other dorsalgia: Secondary | ICD-10-CM | POA: Diagnosis not present

## 2019-04-11 LAB — URINALYSIS, ROUTINE W REFLEX MICROSCOPIC
Bilirubin Urine: NEGATIVE
Glucose, UA: NEGATIVE mg/dL
Ketones, ur: NEGATIVE mg/dL
Nitrite: NEGATIVE
Protein, ur: 30 mg/dL — AB
Specific Gravity, Urine: 1.016 (ref 1.005–1.030)
WBC, UA: >50 WBC/hpf — ABNORMAL HIGH (ref 0–5)
pH: 8 (ref 5.0–8.0)

## 2019-04-11 LAB — CBC WITH DIFFERENTIAL/PLATELET
Abs Immature Granulocytes: 0.06 10*3/uL (ref 0.00–0.07)
Basophils Absolute: 0 10*3/uL (ref 0.0–0.1)
Basophils Relative: 0 %
Eosinophils Absolute: 0.2 10*3/uL (ref 0.0–0.5)
Eosinophils Relative: 3 %
HCT: 28.4 % — ABNORMAL LOW (ref 39.0–52.0)
Hemoglobin: 8.8 g/dL — ABNORMAL LOW (ref 13.0–17.0)
Immature Granulocytes: 1 %
Lymphocytes Relative: 21 %
Lymphs Abs: 1.7 10*3/uL (ref 0.7–4.0)
MCH: 29.2 pg (ref 26.0–34.0)
MCHC: 31 g/dL (ref 30.0–36.0)
MCV: 94.4 fL (ref 80.0–100.0)
Monocytes Absolute: 0.9 10*3/uL (ref 0.1–1.0)
Monocytes Relative: 11 %
Neutro Abs: 5.2 10*3/uL (ref 1.7–7.7)
Neutrophils Relative %: 64 %
Platelets: 230 10*3/uL (ref 150–400)
RBC: 3.01 MIL/uL — ABNORMAL LOW (ref 4.22–5.81)
RDW: 14.6 % (ref 11.5–15.5)
WBC: 8.1 10*3/uL (ref 4.0–10.5)
nRBC: 0 % (ref 0.0–0.2)

## 2019-04-11 LAB — COMPREHENSIVE METABOLIC PANEL
ALT: 16 U/L (ref 0–44)
AST: 14 U/L — ABNORMAL LOW (ref 15–41)
Albumin: 2.8 g/dL — ABNORMAL LOW (ref 3.5–5.0)
Alkaline Phosphatase: 86 U/L (ref 38–126)
Anion gap: 8 (ref 5–15)
BUN: 15 mg/dL (ref 8–23)
CO2: 26 mmol/L (ref 22–32)
Calcium: 8 mg/dL — ABNORMAL LOW (ref 8.9–10.3)
Chloride: 105 mmol/L (ref 98–111)
Creatinine, Ser: 1.27 mg/dL — ABNORMAL HIGH (ref 0.61–1.24)
GFR calc Af Amer: >60 mL/min (ref >60)
GFR calc non Af Amer: 52 mL/min — ABNORMAL LOW (ref >60)
Glucose, Bld: 104 mg/dL — ABNORMAL HIGH (ref 70–99)
Potassium: 4 mmol/L (ref 3.5–5.1)
Sodium: 139 mmol/L (ref 135–145)
Total Bilirubin: 0.4 mg/dL (ref 0.3–1.2)
Total Protein: 5.8 g/dL — ABNORMAL LOW (ref 6.5–8.1)

## 2019-04-11 LAB — BRAIN NATRIURETIC PEPTIDE: B Natriuretic Peptide: 243.7 pg/mL — ABNORMAL HIGH (ref 0.0–100.0)

## 2019-04-11 LAB — PROCALCITONIN: Procalcitonin: <0.1 ng/mL

## 2019-04-11 LAB — LACTIC ACID, PLASMA: Lactic Acid, Venous: 1.3 mmol/L (ref 0.5–1.9)

## 2019-04-11 MED ORDER — PIPERACILLIN-TAZOBACTAM 3.375 G IVPB 30 MIN
3.3750 g | Freq: Once | INTRAVENOUS | Status: AC
  Administered 2019-04-11: 20:00:00 3.375 g via INTRAVENOUS
  Filled 2019-04-11: qty 50

## 2019-04-11 MED ORDER — VANCOMYCIN HCL 1500 MG/300ML IV SOLN
1500.0000 mg | INTRAVENOUS | Status: AC
  Administered 2019-04-11: 1500 mg via INTRAVENOUS
  Filled 2019-04-11: qty 300

## 2019-04-11 MED ORDER — GUAIFENESIN-CODEINE 100-10 MG/5ML PO SOLN
5.0000 mL | Freq: Once | ORAL | Status: AC
  Administered 2019-04-11: 5 mL via ORAL
  Filled 2019-04-11: qty 5

## 2019-04-11 MED ORDER — VANCOMYCIN HCL 1250 MG/250ML IV SOLN
1250.0000 mg | INTRAVENOUS | Status: DC
  Administered 2019-04-12: 1250 mg via INTRAVENOUS
  Filled 2019-04-11: qty 250

## 2019-04-11 MED ORDER — TRAMADOL HCL 50 MG PO TABS: 50.0000 mg | ORAL_TABLET | Freq: Four times a day (QID) | ORAL | Status: DC | PRN

## 2019-04-11 MED ORDER — SODIUM CHLORIDE 0.9 % IV SOLN: 250.0000 mL | INTRAVENOUS | Status: DC | PRN

## 2019-04-11 MED ORDER — SODIUM CHLORIDE 0.9% FLUSH
3.0000 mL | Freq: Two times a day (BID) | INTRAVENOUS | Status: DC
  Administered 2019-04-11 – 2019-04-13 (×4): 3 mL via INTRAVENOUS

## 2019-04-11 MED ORDER — FUROSEMIDE 10 MG/ML IJ SOLN
40.0000 mg | Freq: Every day | INTRAMUSCULAR | Status: DC
  Administered 2019-04-11: 40 mg via INTRAVENOUS
  Filled 2019-04-11: qty 4

## 2019-04-11 MED ORDER — COENZYME Q10 200 MG PO CAPS: 200.0000 mg | ORAL_CAPSULE | Freq: Every day | ORAL | Status: DC

## 2019-04-11 MED ORDER — ACETAMINOPHEN 325 MG PO TABS
650.0000 mg | ORAL_TABLET | ORAL | Status: DC | PRN
  Filled 2019-04-11: qty 2

## 2019-04-11 MED ORDER — FINASTERIDE 5 MG PO TABS
5.0000 mg | ORAL_TABLET | Freq: Every day | ORAL | Status: DC
  Administered 2019-04-12 – 2019-04-14 (×3): 5 mg via ORAL
  Filled 2019-04-11 (×3): qty 1

## 2019-04-11 MED ORDER — METOPROLOL TARTRATE 50 MG PO TABS
100.0000 mg | ORAL_TABLET | Freq: Two times a day (BID) | ORAL | Status: DC
  Administered 2019-04-11 – 2019-04-14 (×6): 100 mg via ORAL
  Filled 2019-04-11 (×7): qty 2

## 2019-04-11 MED ORDER — ADULT MULTIVITAMIN W/MINERALS CH
1.0000 | ORAL_TABLET | Freq: Every day | ORAL | Status: DC
  Administered 2019-04-12 – 2019-04-14 (×3): 1 via ORAL
  Filled 2019-04-11 (×3): qty 1

## 2019-04-11 MED ORDER — SODIUM CHLORIDE 0.9 % IV SOLN
2.0000 g | Freq: Three times a day (TID) | INTRAVENOUS | Status: DC
  Administered 2019-04-12 – 2019-04-14 (×8): 2 g via INTRAVENOUS
  Filled 2019-04-11 (×11): qty 2

## 2019-04-11 MED ORDER — AMIODARONE HCL 200 MG PO TABS
200.0000 mg | ORAL_TABLET | Freq: Every day | ORAL | Status: DC
  Administered 2019-04-12 – 2019-04-14 (×3): 200 mg via ORAL
  Filled 2019-04-11 (×3): qty 1

## 2019-04-11 MED ORDER — OMEGA-3-ACID ETHYL ESTERS 1 G PO CAPS
1.0000 g | ORAL_CAPSULE | Freq: Two times a day (BID) | ORAL | Status: DC
  Administered 2019-04-11 – 2019-04-14 (×6): 1 g via ORAL
  Filled 2019-04-11 (×6): qty 1

## 2019-04-11 MED ORDER — TAMSULOSIN HCL 0.4 MG PO CAPS
0.4000 mg | ORAL_CAPSULE | Freq: Every day | ORAL | Status: DC
  Administered 2019-04-12 – 2019-04-14 (×3): 0.4 mg via ORAL
  Filled 2019-04-11 (×3): qty 1

## 2019-04-11 MED ORDER — SENNOSIDES-DOCUSATE SODIUM 8.6-50 MG PO TABS: 1.0000 | ORAL_TABLET | Freq: Two times a day (BID) | ORAL | Status: DC | PRN

## 2019-04-11 MED ORDER — METHIMAZOLE 10 MG PO TABS
20.0000 mg | ORAL_TABLET | Freq: Two times a day (BID) | ORAL | Status: DC
  Administered 2019-04-11 – 2019-04-14 (×6): 20 mg via ORAL
  Filled 2019-04-11 (×7): qty 2

## 2019-04-11 MED ORDER — OMEGA-3 FATTY ACIDS 1000 MG PO CAPS: 2.0000 g | ORAL_CAPSULE | Freq: Every day | ORAL | Status: DC

## 2019-04-11 MED ORDER — APIXABAN 5 MG PO TABS
5.0000 mg | ORAL_TABLET | Freq: Two times a day (BID) | ORAL | Status: DC
  Administered 2019-04-11 – 2019-04-14 (×6): 5 mg via ORAL
  Filled 2019-04-11 (×6): qty 1

## 2019-04-11 MED ORDER — BENZONATATE 100 MG PO CAPS: 200.0000 mg | ORAL_CAPSULE | Freq: Three times a day (TID) | ORAL | Status: DC | PRN

## 2019-04-11 MED ORDER — SODIUM CHLORIDE 0.9% FLUSH: 3.0000 mL | INTRAVENOUS | Status: DC | PRN

## 2019-04-11 MED ORDER — DILTIAZEM HCL ER COATED BEADS 180 MG PO CP24
360.0000 mg | ORAL_CAPSULE | Freq: Every day | ORAL | Status: DC
  Administered 2019-04-12 – 2019-04-14 (×3): 360 mg via ORAL
  Filled 2019-04-11 (×4): qty 2

## 2019-04-11 MED ORDER — POLYETHYLENE GLYCOL 3350 17 G PO PACK: 17.0000 g | PACK | Freq: Every day | ORAL | Status: DC | PRN

## 2019-04-11 MED ORDER — BISACODYL 10 MG RE SUPP: 10.0000 mg | Freq: Every day | RECTAL | Status: DC | PRN

## 2019-04-11 MED ORDER — VANCOMYCIN HCL IN DEXTROSE 1-5 GM/200ML-% IV SOLN
1000.0000 mg | Freq: Once | INTRAVENOUS | Status: AC
  Administered 2019-04-11: 1000 mg via INTRAVENOUS
  Filled 2019-04-11: qty 200

## 2019-04-11 MED ORDER — PRO-STAT SUGAR FREE PO LIQD
30.0000 mL | Freq: Three times a day (TID) | ORAL | Status: DC
  Administered 2019-04-12 – 2019-04-14 (×8): 30 mL via ORAL
  Filled 2019-04-11 (×8): qty 30

## 2019-04-11 MED ORDER — PRAVASTATIN SODIUM 20 MG PO TABS
10.0000 mg | ORAL_TABLET | Freq: Every day | ORAL | Status: DC
  Administered 2019-04-11 – 2019-04-13 (×3): 10 mg via ORAL
  Filled 2019-04-11 (×3): qty 1

## 2019-04-11 NOTE — ED Triage Notes (Addendum)
Pt BIBA from home. Pt was hospitalized for COVID in December. Pt has not gotten back to full strength after 5 week stay for COVID. Pt has been having back pain radiating down right leg. Pt unable to ambulate at home. EMS states they had to hold him up to move him. Edema to right leg. Pt has hx of afib and CHF.  Pt has taken oxy and muscle relaxer's without relief.  Last physical therapy Friday, has gotten worse since.  Pt states he thinks he has a UTI, pt has indwelling catheter.

## 2019-04-11 NOTE — Progress Notes (Signed)
Pharmacy Antibiotic Note  Derek Foster is a 84 y.o. male admitted on 04/11/2019 with possible pneumonia.  Patient presents with worsening weakness, pain in low back and right leg, has an indwelling catheter.  In the ED, patient received Zosyn 3.375gm and Vancomycin 1gm IV x 1 dose each.  Pharmacy has been consulted for Vancomycin and Cefepime dosing.  Plan:  Cefepime 2gm IV q8h Vancomycin 1500mg  IV x 1 immediately following the end of the 1gm infusion (for a total of 2500mg  loading dose (20mg /kg) then 1250 mg IV Q 24 hrs. Goal AUC 400-550.  Expected AUC: 456;  SCr used: 1.27  Follow renal function  Height: 6' (182.9 cm) Weight: 267 lb (121.1 kg) IBW/kg (Calculated) : 77.6  Temp (24hrs), Avg:98.1 F (36.7 C), Min:98.1 F (36.7 C), Max:98.1 F (36.7 C)  Recent Labs  Lab 04/11/19 1651  WBC 8.1  CREATININE 1.27*    Estimated Creatinine Clearance: 60.3 mL/min (A) (by C-G formula based on SCr of 1.27 mg/dL (H)).    Allergies  Allergen Reactions  . Indomethacin Anaphylaxis    But tolerates ibuprofen, aleve  . Iodine-131 Anaphylaxis    Antimicrobials this admission: 3/22 Zosyn x 1 3/23 Cefepime >>   3/22 Vanc >>  Dose adjustments this admission:    Microbiology results: 3/22 BCx:   3/22 UCx:     Thank you for allowing pharmacy to be a part of this patient's care.  Everette Rank, PharmD 04/11/2019 9:23 PM

## 2019-04-11 NOTE — ED Notes (Signed)
Daughter in law updated regarding pt's status.

## 2019-04-11 NOTE — H&P (Signed)
History and Physical    Derek Foster [id number] DOB: [birth year] DOA: 04/11/2019  PCP: Celene Squibb, MD Patient coming from: Home  Chief Complaint: Back pain, leg pain  HPI: Derek Foster is a 84 y.o. male with medical history significant of CAD status post PCI, A. fib, hypertension, hyperlipidemia, hyperthyroidism, GERD, COVID-19 infection in December 2020 (diagnosed 12/29), SBO, recent hospitalization in February 2021 for hematuria secondary to Foley trauma in the setting of very large prostate and anticoagulation, UTI presenting to the ED with complaints of back pain and leg pain.  Patient reports 1 week history of lower back pain radiating to his left leg.  No associated paresthesias.  States he has not been able to sleep due to his back pain.  Denies any recent falls or injury to his back.  He has a chronic indwelling Foley catheter and states the urine has been smelling bad.  Denies fecal incontinence or saddle anesthesia.  Denies fevers or chills.  Denies nausea, vomiting, or abdominal pain.  Denies shortness of breath.  Reports having a persistent cough since he was diagnosed with Covid back in December.  Denies chest pain.  No other complaints.  ED Course: Blood pressure slightly elevated, remainder of vital signs stable.  Labs showing no leukocytosis.  Hemoglobin stable at 8.8 compared to prior labs.  Creatinine 1.2, stable compared to prior labs.  BNP 243, no significant change compared to prior labs.  UA with large amount of leukocytes, greater than 50 WBCs, and rare bacteria.  No microscopic hematuria.  Chest x-ray showing left lung base atelectasis versus infiltrate.  A small left effusion may be present.  Stable cardiomegaly. Patient received vancomycin and Zosyn.  Review of Systems:  All systems reviewed and apart from history of presenting illness, are negative.  Past Medical History:  Diagnosis Date  . Acute myocardial infarction, unspecified site, episode of care unspecified     . CAD in native artery    a. cath 01/27/2009 : s/p promus DES to LAD and medical managment of 70-80% mRCA  . Edema   . HTN (hypertension)   . Mixed hyperlipidemia   . Unspecified hypothyroidism     Past Surgical History:  Procedure Laterality Date  . CHOLECYSTECTOMY    . CORONARY STENT PLACEMENT    . CYSTOSCOPY/RETROGRADE/URETEROSCOPY Bilateral 03/09/2019   Procedure: CYSTOSCOPY, Clot Evacuation, Fulguration;  Surgeon: Festus Aloe, MD;  Location: [facility];  Service: Urology;  Laterality: Bilateral;  . HERNIA REPAIR       reports that he quit smoking about 62 years ago. He has never used smokeless tobacco. He reports that he does not drink alcohol or use drugs.  Allergies  Allergen Reactions  . Indomethacin Anaphylaxis    But tolerates ibuprofen, aleve  . Iodine-131 Anaphylaxis    Family History  Problem Relation Age of Onset  . Heart attack Father   . Lung cancer Sister     Prior to Admission medications   Medication Sig Start Date End Date Taking? Authorizing Provider  Amino Acids-Protein Hydrolys (FEEDING SUPPLEMENT, PRO-STAT SUGAR FREE 64,) LIQD Take 30 mLs by mouth 3 (three) times daily with meals.    [provider]  amiodarone (PACERONE) 200 MG tablet Take 200 mg by mouth daily.    [provider]  apixaban (ELIQUIS) 5 MG TABS tablet Take 1 tablet (5 mg total) by mouth 2 (two) times daily. 03/30/19   Gerlene Fee, NP  benzonatate (TESSALON) 100 MG capsule Take 1 capsule (100  mg total) by mouth every 8 (eight) hours as needed for cough. 03/30/19   Gerlene Fee, NP  bisacodyl (DULCOLAX) 10 MG suppository Place 1 suppository (10 mg total) rectally daily as needed for severe constipation. 03/12/19   Vashti Hey, MD  Coenzyme Q10 200 MG capsule Take 200 mg by mouth daily.      [provider]  dextromethorphan 15 MG/5ML syrup Take 15 mLs by mouth every 6 (six) hours as needed for cough.    [provider]  diltiazem  (CARDIZEM CD) 360 MG 24 hr capsule Take 1 capsule (360 mg total) by mouth daily. 03/30/19   Gerlene Fee, NP  finasteride (PROSCAR) 5 MG tablet Take 1 tablet (5 mg total) by mouth daily. 03/30/19   Gerlene Fee, NP  fish oil-omega-3 fatty acids 1000 MG capsule Take 2 g by mouth daily.      [provider]  furosemide (LASIX) 20 MG tablet Take 20 mg by mouth daily. 04/06/19   [provider]  methimazole (TAPAZOLE) 10 MG tablet Take 20 mg by mouth every 8 (eight) hours.    [provider]  metoprolol tartrate (LOPRESSOR) 100 MG tablet Take 1 tablet (100 mg total) by mouth 2 (two) times daily. 04/07/19   Erlene Quan, PA-C  Multiple Vitamin (MULTIVITAMIN) tablet Take 1 tablet by mouth daily.      [provider]  nitroGLYCERIN (NITROSTAT) 0.4 MG SL tablet Place 1 tablet (0.4 mg total) under the tongue every 5 (five) minutes as needed. 03/30/19   Gerlene Fee, NP  ondansetron (ZOFRAN-ODT) 4 MG disintegrating tablet Take 4 mg by mouth every 6 (six) hours as needed for nausea or vomiting.    [provider]  pantoprazole (PROTONIX) 40 MG tablet Take 1 tablet (40 mg total) by mouth 2 (two) times daily. 03/30/19   Gerlene Fee, NP  polyethylene glycol (MIRALAX / GLYCOLAX) 17 g packet Take 17 g by mouth daily as needed for mild constipation. 03/12/19   Vashti Hey, MD  potassium chloride (KLOR-CON) 10 MEQ tablet Take 10 mEq by mouth daily. 04/06/19   [provider]  pravastatin (PRAVACHOL) 10 MG tablet Take 1 tablet (10 mg total) by mouth daily at 6 PM. 03/30/19   Green, Phylis Bougie, NP  senna-docusate (SENOKOT-S) 8.6-50 MG tablet Take 1 tablet by mouth 2 (two) times daily. 03/12/19   Vashti Hey, MD  tamsulosin [facility]) 0.4 MG CAPS capsule Take 1 capsule (0.4 mg total) by mouth daily after breakfast. 03/30/19   Gerlene Fee, NP  simvastatin (ZOCOR) 20 MG tablet Take 1 tablet (20 mg total) by mouth every evening.  02/25/11 04/02/11  Josue Hector, MD    Physical Exam: Vitals:   04/11/19 1600 04/11/19 1830 04/11/19 1900 04/11/19 2000  BP: (!) 148/51 (!) 150/59 (!) 156/58 (!) 150/56  Pulse:  63 64 68  Resp:  18 17 16   Temp:      TempSrc:      SpO2:  92% 97% 100%  Weight:      Height:        Physical Exam  Constitutional: He is oriented to person, place, and time. He appears well-developed and well-nourished. No distress.  Resting comfortably eating dinner  HENT:  Head: Normocephalic.  Eyes: Right eye exhibits no discharge. Left eye exhibits no discharge.  Cardiovascular: Normal rate, regular rhythm and intact distal pulses.  Pulmonary/Chest: Effort normal. No respiratory distress. He has no wheezes.  He has rales.  Rales appreciated at the left lung base  Abdominal: Soft. Bowel sounds are normal. He exhibits no distension. There is no abdominal tenderness. There is no guarding.  Musculoskeletal:        General: Edema present.     Cervical back: Neck supple.     Comments: +3 pitting edema bilateral lower extremities  Neurological: He is alert and oriented to person, place, and time.  Skin: Skin is warm and dry. He is not diaphoretic.  Chronic venous stasis dermatitis of bilateral lower extremities     Labs on Admission: I have personally reviewed following labs and imaging studies  CBC: Recent Labs  Lab 04/11/19 1651  WBC 8.1  NEUTROABS 5.2  HGB 8.8*  HCT 28.4*  MCV 94.4  PLT 123456   Basic Metabolic Panel: Recent Labs  Lab 04/11/19 1651  NA 139  K 4.0  CL 105  CO2 26  GLUCOSE 104*  BUN 15  CREATININE 1.27*  CALCIUM 8.0*   GFR: Estimated Creatinine Clearance: 60.3 mL/min (A) (by C-G formula based on SCr of 1.27 mg/dL (H)). Liver Function Tests: Recent Labs  Lab 04/11/19 1651  AST 14*  ALT 16  ALKPHOS 86  BILITOT 0.4  PROT 5.8*  ALBUMIN 2.8*   No results for input(s): LIPASE, AMYLASE in the last 168 hours. No results for input(s): AMMONIA in the last 168  hours. Coagulation Profile: No results for input(s): INR, PROTIME in the last 168 hours. Cardiac Enzymes: No results for input(s): CKTOTAL, CKMB, CKMBINDEX, TROPONINI in the last 168 hours. BNP (last 3 results) No results for input(s): PROBNP in the last 8760 hours. HbA1C: No results for input(s): HGBA1C in the last 72 hours. CBG: No results for input(s): GLUCAP in the last 168 hours. Lipid Profile: No results for input(s): CHOL, HDL, LDLCALC, TRIG, CHOLHDL, LDLDIRECT in the last 72 hours. Thyroid Function Tests: No results for input(s): TSH, T4TOTAL, FREET4, T3FREE, THYROIDAB in the last 72 hours. Anemia Panel: No results for input(s): VITAMINB12, FOLATE, FERRITIN, TIBC, IRON, RETICCTPCT in the last 72 hours. Urine analysis:    Component Value Date/Time   COLORURINE YELLOW 04/11/2019 1756   APPEARANCEUR CLOUDY (A) 04/11/2019 1756   LABSPEC 1.016 04/11/2019 1756   PHURINE 8.0 04/11/2019 1756   GLUCOSEU NEGATIVE 04/11/2019 1756   HGBUR MODERATE (A) 04/11/2019 1756   BILIRUBINUR NEGATIVE 04/11/2019 1756   KETONESUR NEGATIVE 04/11/2019 1756   PROTEINUR 30 (A) 04/11/2019 1756   NITRITE NEGATIVE 04/11/2019 1756   LEUKOCYTESUR LARGE (A) 04/11/2019 1756    Radiological Exams on Admission: DG Chest Port 1 View  Result Date: 04/11/2019 CLINICAL DATA:  84 year old male with shortness of breath and cough. EXAM: PORTABLE CHEST 1 VIEW COMPARISON:  Chest radiograph dated 02/15/2019. FINDINGS: Faint left lung base hazy density may represent atelectasis although a small left pleural effusion is not excluded. Clinical correlation is recommended. The right lung is clear. No pneumothorax. There is stable cardiomegaly. Coronary vascular calcification or stent. Atherosclerotic calcification of the aorta. No acute osseous pathology. Degenerative changes of the spine. IMPRESSION: 1. Left lung base atelectasis versus infiltrate. A small left pleural effusion may be present. 2. Stable cardiomegaly.  Electronically Signed   By: Anner Crete M.D.   On: 04/11/2019 17:41    EKG: Independently reviewed.  Regular rhythm, heart rate 66.  Artifact.  Assessment/Plan Principal Problem:   UTI (urinary tract infection) due to urinary indwelling catheter (HCC) Active Problems:   HCAP (healthcare-associated pneumonia)   Pleural effusion  Radiculopathy   Atrial fibrillation (HCC)  CAUTI: UA with large amount of leukocytes, greater than 50 WBCs, and rare bacteria.  No leukocytosis or signs of sepsis.  Plan is to continue antibiotic coverage with cefepime.  Add on urine culture and blood cultures.  Check lactic acid level.  Possible HCAP: Chest x-ray showing left lung base density suspicious for possible infiltrate.  No signs of sepsis.  No leukocytosis.  Not hypoxic.  Plan is to continue antibiotic coverage with vancomycin and cefepime at this time.  Check procalcitonin level.  Antitussive as needed for cough.  Check lactic acid level and order blood cultures.  Will not retest for COVID-19 as still within 33-month timeframe from previous positive test.  Pleural effusion, peripheral edema: Chest x-ray showing a small left effusion.  No documented history of CHF.  Recent echo done 02/13/2019 with normal systolic function (LVEF 60 to 65%) and indeterminate diastolic parameters.  No significant elevation of BNP from baseline, however, does have significant peripheral edema.  Start IV Lasix 40 mg daily.  Monitor intake and output, daily weights, and low-sodium diet with fluid restriction.  Suspected acute lumbosacral radiculopathy: Patient reports 1 week history of lower back pain radiating down his left lower extremity.  No red flags such as fevers, incontinence, or saddle anesthesia.  Straight leg raise test positive.  Order tramadol as needed for pain, Tylenol as needed, and PT/OT evaluation.  Will order lumbar MRI for further evaluation.  Atrial fibrillation: Stable.  Continue home amiodarone, Cardizem,  and Eliquis.  DVT prophylaxis: Eliquis Code Status: DNR.  Confirmed by patient and signed form present at bedside. Family Communication: No family available at this time. Disposition Plan: Anticipate discharge after clinical improvement.  Will likely require SNF placement given weakness and inability to ambulate. Consults called: None Admission status: It is my clinical opinion that admission to INPATIENT is reasonable and necessary because of the expectation that this patient will require hospital care that crosses at least 2 midnights to treat this condition based on the medical complexity of the problems presented.  Given the aforementioned information, the predictability of an adverse outcome is felt to be significant.  The medical decision making on this patient was of high complexity and the patient is at high risk for clinical deterioration, therefore this is a level 3 visit.  Shela Leff MD [facility]  If 7PM-7AM, please contact night-coverage [email or web address]  04/11/2019, 9:31 PM

## 2019-04-11 NOTE — ED Provider Notes (Signed)
[name] Provider Note   CSN: [id number] Arrival date & time: 04/11/19  1515     History Chief Complaint  Patient presents with  . Leg Pain  . Back Pain    Derek Foster is a 84 y.o. male.  HPI   Patient with multiple medical issues including coronavirus infection within the past 3 months presents with concern of weakness, back pain, right leg pain. Patient notes that after his diagnosis he was hospitalized for some time, had multiple complications.  On discharge she went to a rehabilitation facility and was generally getting better.  After 3 weeks there he went to live with family members.  He notes that now over the past few days, possibly week he has had worsening generalized weakness, and new pain in his low back, right leg.  Pain is described as severe, sharp, worse with attempted weightbearing or motion.  No new focal weakness, there is diffuse loss of function.  No confusion, disorientation, fever, vomiting, diarrhea. Patient has an indwelling catheter, notes malodorous urine as well.   Past Medical History:  Diagnosis Date  . Acute myocardial infarction, unspecified site, episode of care unspecified   . CAD in native artery    a. cath 01/27/2009 : s/p promus DES to LAD and medical managment of 70-80% mRCA  . Edema   . HTN (hypertension)   . Mixed hyperlipidemia   . Unspecified hypothyroidism     Patient Active Problem List   Diagnosis Date Noted  . Anticoagulated 04/07/2019  . BPH (benign prostatic hyperplasia) 03/28/2019  . GERD without esophagitis 03/28/2019  . Protein-calorie malnutrition, severe ([place]) 03/28/2019  . Klebsiella cystitis 03/18/2019  . Urinary tract infection due to Proteus 03/18/2019  . Palliative care by specialist   . DNR (do not resuscitate)   . Weakness generalized   . Grief   . Gross hematuria   . AKI (acute kidney injury) ([place]) 02/12/2019  . SBO (small bowel obstruction) ([place]) 02/12/2019  . CAD S/P  percutaneous coronary angioplasty 02/11/2019  . Atrial fibrillation with RVR ([place]) 02/11/2019  . Mixed hyperlipidemia 09/10/2009  . EDEMA 02/27/2009  . Hyperthyroidism 02/26/2009  . Essential hypertension 02/26/2009  . MYOCARDIAL INFARCTION 02/26/2009    Past Surgical History:  Procedure Laterality Date  . CHOLECYSTECTOMY    . CORONARY STENT PLACEMENT    . CYSTOSCOPY/RETROGRADE/URETEROSCOPY Bilateral 03/09/2019   Procedure: CYSTOSCOPY, Clot Evacuation, Fulguration;  Surgeon: Festus Aloe, MD;  Location: [facility];  Service: Urology;  Laterality: Bilateral;  . HERNIA REPAIR         Family History  Problem Relation Age of Onset  . Heart attack Father   . Lung cancer Sister     Social History   Tobacco Use  . Smoking status: Former Smoker    Quit date: 01/20/1957    Years since quitting: 62.2  . Smokeless tobacco: Never Used  Substance Use Topics  . Alcohol use: No  . Drug use: Never    Home Medications Prior to Admission medications   Medication Sig Start Date End Date Taking? Authorizing Provider  Amino Acids-Protein Hydrolys (FEEDING SUPPLEMENT, PRO-STAT SUGAR FREE 64,) LIQD Take 30 mLs by mouth 3 (three) times daily with meals.    [provider]  amiodarone (PACERONE) 200 MG tablet Take 200 mg by mouth daily.    [provider]  apixaban (ELIQUIS) 5 MG TABS tablet Take 1 tablet (5 mg total) by mouth 2 (two) times daily. 03/30/19   Ok Edwards  S, NP  benzonatate (TESSALON) 100 MG capsule Take 1 capsule (100 mg total) by mouth every 8 (eight) hours as needed for cough. 03/30/19   Gerlene Fee, NP  bisacodyl (DULCOLAX) 10 MG suppository Place 1 suppository (10 mg total) rectally daily as needed for severe constipation. 03/12/19   Vashti Hey, MD  Coenzyme Q10 200 MG capsule Take 200 mg by mouth daily.      [provider]  dextromethorphan 15 MG/5ML syrup Take 15 mLs by mouth every 6 (six) hours as needed for cough.    [provider]  diltiazem (CARDIZEM CD) 360 MG 24 hr capsule Take 1 capsule (360 mg total) by mouth daily. 03/30/19   Gerlene Fee, NP  finasteride (PROSCAR) 5 MG tablet Take 1 tablet (5 mg total) by mouth daily. 03/30/19   Gerlene Fee, NP  fish oil-omega-3 fatty acids 1000 MG capsule Take 2 g by mouth daily.      [provider]  furosemide (LASIX) 20 MG tablet Take 20 mg by mouth daily. 04/06/19   [provider]  methimazole (TAPAZOLE) 10 MG tablet Take 20 mg by mouth every 8 (eight) hours.    [provider]  metoprolol tartrate (LOPRESSOR) 100 MG tablet Take 1 tablet (100 mg total) by mouth 2 (two) times daily. 04/07/19   Erlene Quan, PA-C  Multiple Vitamin (MULTIVITAMIN) tablet Take 1 tablet by mouth daily.      [provider]  nitroGLYCERIN (NITROSTAT) 0.4 MG SL tablet Place 1 tablet (0.4 mg total) under the tongue every 5 (five) minutes as needed. 03/30/19   Gerlene Fee, NP  ondansetron (ZOFRAN-ODT) 4 MG disintegrating tablet Take 4 mg by mouth every 6 (six) hours as needed for nausea or vomiting.    [provider]  pantoprazole (PROTONIX) 40 MG tablet Take 1 tablet (40 mg total) by mouth 2 (two) times daily. 03/30/19   Gerlene Fee, NP  polyethylene glycol (MIRALAX / GLYCOLAX) 17 g packet Take 17 g by mouth daily as needed for mild constipation. 03/12/19   Vashti Hey, MD  potassium chloride (KLOR-CON) 10 MEQ tablet Take 10 mEq by mouth daily. 04/06/19   [provider]  pravastatin (PRAVACHOL) 10 MG tablet Take 1 tablet (10 mg total) by mouth daily at 6 PM. 03/30/19   Green, Phylis Bougie, NP  senna-docusate (SENOKOT-S) 8.6-50 MG tablet Take 1 tablet by mouth 2 (two) times daily. 03/12/19   Vashti Hey, MD  tamsulosin [facility]) 0.4 MG CAPS capsule Take 1 capsule (0.4 mg total) by mouth daily after breakfast. 03/30/19   Gerlene Fee, NP  simvastatin (ZOCOR) 20 MG tablet Take 1 tablet (20 mg total) by  mouth every evening. 02/25/11 04/02/11  Josue Hector, MD    Allergies    Indomethacin and Iodine-131  Review of Systems   Review of Systems  Constitutional:       Per HPI, otherwise negative  HENT:       Per HPI, otherwise negative  Respiratory:       Per HPI, otherwise negative  Cardiovascular:       Per HPI, otherwise negative  Gastrointestinal: Negative for vomiting.  Endocrine:       Negative aside from HPI  Genitourinary:       Neg aside from HPI   Musculoskeletal:       Per HPI, otherwise negative  Skin: Negative.   Neurological: Positive for weakness. Negative for syncope.  Physical Exam Updated Vital Signs BP (!) 156/58   Pulse 64   Temp 98.1 F (36.7 C) (Oral)   Resp 17   Ht 6' (1.829 m)   Wt 121.1 kg   SpO2 97%   BMI 36.21 kg/m   Physical Exam Vitals and nursing note reviewed.  Constitutional:      General: He is not in acute distress.    Appearance: He is well-developed. He is ill-appearing. He is not toxic-appearing.  HENT:     Head: Normocephalic and atraumatic.  Eyes:     Conjunctiva/sclera: Conjunctivae normal.  Cardiovascular:     Rate and Rhythm: Normal rate and regular rhythm.  Pulmonary:     Effort: Pulmonary effort is normal. No respiratory distress.     Breath sounds: No stridor.  Abdominal:     General: There is no distension.  Musculoskeletal:     Right lower leg: Edema present.     Left lower leg: Edema present.  Skin:    General: Skin is warm and dry.  Neurological:     Mental Status: He is alert and oriented to person, place, and time.     ED Results / Procedures / Treatments   Labs (all labs ordered are listed, but only abnormal results are displayed) Labs Reviewed  COMPREHENSIVE METABOLIC PANEL - Abnormal; Notable for the following components:      Result Value   Glucose, Bld 104 (*)    Creatinine, Ser 1.27 (*)    Calcium 8.0 (*)    Total Protein 5.8 (*)    Albumin 2.8 (*)    AST 14 (*)    GFR calc non Af Amer  52 (*)    All other components within normal limits  BRAIN NATRIURETIC PEPTIDE - Abnormal; Notable for the following components:   B Natriuretic Peptide 243.7 (*)    All other components within normal limits  CBC WITH DIFFERENTIAL/PLATELET - Abnormal; Notable for the following components:   RBC 3.01 (*)    Hemoglobin 8.8 (*)    HCT 28.4 (*)    All other components within normal limits  URINALYSIS, ROUTINE W REFLEX MICROSCOPIC - Abnormal; Notable for the following components:   APPearance CLOUDY (*)    Hgb urine dipstick MODERATE (*)    Protein, ur 30 (*)    Leukocytes,Ua LARGE (*)    WBC, UA >50 (*)    Bacteria, UA RARE (*)    All other components within normal limits    EKG EKG Interpretation  Date/Time:  Monday April 11 2019 16:41:02 EDT Ventricular Rate:  66 PR Interval:    QRS Duration: 75 QT Interval:  460 QTC Calculation: 482 R Axis:   75 Text Interpretation: Sinus rhythm Ventricular premature complex Low voltage, precordial leads Borderline T abnormalities, anterior leads Borderline prolonged QT interval Artifact Abnormal ECG Confirmed by Carmin Muskrat [phone]) on 04/11/2019 4:48:52 PM   Radiology DG Chest Port 1 View  Result Date: 04/11/2019 CLINICAL DATA:  84 year old male with shortness of breath and cough. EXAM: PORTABLE CHEST 1 VIEW COMPARISON:  Chest radiograph dated 02/15/2019. FINDINGS: Faint left lung base hazy density may represent atelectasis although a small left pleural effusion is not excluded. Clinical correlation is recommended. The right lung is clear. No pneumothorax. There is stable cardiomegaly. Coronary vascular calcification or stent. Atherosclerotic calcification of the aorta. No acute osseous pathology. Degenerative changes of the spine. IMPRESSION: 1. Left lung base atelectasis versus infiltrate. A small left pleural effusion may be present. 2. Stable  cardiomegaly. Electronically Signed   By: Anner Crete M.D.   On: 04/11/2019 17:41     Procedures Procedures (including critical care time)  Medications Ordered in ED Medications  piperacillin-tazobactam (ZOSYN) IVPB 3.375 g (has no administration in time range)  vancomycin (VANCOCIN) IVPB 1000 mg/200 mL premix (has no administration in time range)  guaiFENesin-codeine 100-10 MG/5ML solution 5 mL (5 mLs Oral Given 04/11/19 1756)    ED Course  I have reviewed the triage vital signs and the nursing notes.  Pertinent labs & imaging results that were available during my care of the patient were reviewed by me and considered in my medical decision making (see chart for details).   7:34 PM Patient in similar condition, tachypneic, tachycardic, continues to complain of pain.  We reviewed all findings, notable for concern for infection in the context of an indwelling Foley catheter, as well as pneumonia.  Patient does describe cough, no chest pain.  Given this new consideration of infection, patient will start antibiotics, and with recent hospitalization requires broad-spectrum antibiotics.  Patient had a Covid 3 months ago, additional testing not currently indicated. Given concern for healthcare acquired pneumonia, catheter associated urinary tract infection, and worsening weakness he was admitted for further monitoring, management. Final Clinical Impression(s) / ED Diagnoses Final diagnoses:  HCAP (healthcare-associated pneumonia)  Urinary tract infection associated with indwelling urethral catheter, initial encounter [facility])     Carmin Muskrat, MD 04/11/19 [phone]

## 2019-04-12 ENCOUNTER — Inpatient Hospital Stay (HOSPITAL_COMMUNITY): Payer: PPO

## 2019-04-12 DIAGNOSIS — M5416 Radiculopathy, lumbar region: Secondary | ICD-10-CM

## 2019-04-12 DIAGNOSIS — R2689 Other abnormalities of gait and mobility: Secondary | ICD-10-CM

## 2019-04-12 DIAGNOSIS — I4891 Unspecified atrial fibrillation: Secondary | ICD-10-CM

## 2019-04-12 LAB — BASIC METABOLIC PANEL
Anion gap: 7 (ref 5–15)
BUN: 15 mg/dL (ref 8–23)
CO2: 27 mmol/L (ref 22–32)
Calcium: 7.9 mg/dL — ABNORMAL LOW (ref 8.9–10.3)
Chloride: 104 mmol/L (ref 98–111)
Creatinine, Ser: 1.34 mg/dL — ABNORMAL HIGH (ref 0.61–1.24)
GFR calc Af Amer: 57 mL/min — ABNORMAL LOW (ref >60)
GFR calc non Af Amer: 49 mL/min — ABNORMAL LOW (ref >60)
Glucose, Bld: 109 mg/dL — ABNORMAL HIGH (ref 70–99)
Potassium: 3.7 mmol/L (ref 3.5–5.1)
Sodium: 138 mmol/L (ref 135–145)

## 2019-04-12 LAB — MRSA PCR SCREENING: MRSA by PCR: NEGATIVE

## 2019-04-12 MED ORDER — CHLORHEXIDINE GLUCONATE CLOTH 2 % EX PADS
6.0000 | MEDICATED_PAD | Freq: Every day | CUTANEOUS | Status: DC
  Administered 2019-04-12 – 2019-04-14 (×3): 6 via TOPICAL

## 2019-04-12 MED ORDER — GADOBUTROL 1 MMOL/ML IV SOLN
10.0000 mL | Freq: Once | INTRAVENOUS | Status: AC | PRN
  Administered 2019-04-12: 10 mL via INTRAVENOUS

## 2019-04-12 MED ORDER — BOOST / RESOURCE BREEZE PO LIQD CUSTOM
1.0000 | Freq: Two times a day (BID) | ORAL | Status: DC
  Administered 2019-04-12 – 2019-04-14 (×4): 1 via ORAL

## 2019-04-12 NOTE — Progress Notes (Signed)
Initial Nutrition Assessment  DOCUMENTATION CODES:   Obesity unspecified  INTERVENTION:  Boost Breeze po BID, each supplement provides 250 kcal and 9 grams of protein  Continue Pro-stat TID, each supplement provides 100 kcal and 15 grams of protein  Continue MVI with minerals daily   NUTRITION DIAGNOSIS:   Increased nutrient needs related to acute illness(UTI due to urinary indwelling catheter and pneumonia) as evidenced by estimated needs.  GOAL:   Patient will meet greater than or equal to 90% of their needs    MONITOR:   PO intake, I & O's, Labs, Weight trends, Supplement acceptance  REASON FOR ASSESSMENT:   Malnutrition Screening Tool    ASSESSMENT:  84 year old male with past medical history of CAD s/p PCI, atrial fibrillation, HTN, HLD, hyperthyroidism, GERD, COVID-19 infection in December 2020 (diagnosed 12/29), SBO, recent hospitalization in February for hematuria secondary to Foley trama in the setting of very large prostate and anticoagulation, UTI presented to ED with complaints of back and leg pain, and reports bad smelling urine with chronic indwelling Foley catheter.  Patient admitted on 3/22 for UTI due to urinary indwelling catheter and pneumonia.  Patient resting this morning at RD visit. He reports feeling slightly better and tired. Patient endorses decreased appetite, recalls having a little bit of his eggs, couple bites of fruit and some orange juice for breakfast. He stated that he hasn't had much of an appetite since he and his wife became sick with Covid. Patient stated that he was at [facility] for 2 weeks and then spent an additional 3 weeks in rehab. During that time, he reports that he had an SBO, was unable to eat and had a feeding tube. He shared that his wife was a patient at [facility], sadly she passed from the virus. Patient stated that after returning home, he stayed for about a week and then moved in with his son.   Patient recalls weighing 298 lbs prior to  getting sick. He drinks Boost supplements at home, does not like Ensure. Patient amenable to trying Boost Breeze supplement and will continue to drink Prostat to aid with estimated needs.   I/Os: -2115 ml since admit UOP: 2125 ml since admit  Deep pitting BLE edema noted per RN assessment Current wt 288 lbs Per history, on 3/10 pt wt 272.36 lbs, on 2/24 pt wt 268.4 lbs, on 2/20 pt wt 274.56 lbs. Suspect uptrend of weights secondary to fluid status.   Medications reviewed and include: MVI, Prostat, Omega-3 IVPB: Maxipime, Vancomycin  Labs: BG 104-109, Cr 1.34 (H), BNP 243 (H)  NUTRITION - FOCUSED PHYSICAL EXAM:    Most Recent Value  Orbital Region  Moderate depletion  Upper Arm Region  Moderate depletion  Thoracic and Lumbar Region  Unable to assess  Buccal Region  Moderate depletion  Temple Region  Mild depletion  Clavicle Bone Region  Mild depletion  Clavicle and Acromion Bone Region  No depletion  Scapular Bone Region  Unable to assess  Dorsal Hand  Moderate depletion  Patellar Region  Unable to assess  Anterior Thigh Region  Unable to assess  Posterior Calf Region  Unable to assess  Edema (RD Assessment)  Moderate  Hair  Reviewed  Eyes  Reviewed  Mouth  Reviewed  Skin  Reviewed  Nails  Reviewed       Diet Order:   Diet Order            Diet Heart Room service appropriate? Yes; Fluid consistency: Thin  Diet effective now              EDUCATION NEEDS:   No education needs have been identified at this time  Skin:  Skin Assessment: Skin Integrity Issues: Skin Integrity Issues:: Other (Comment) Other: serous blister; scrotum  Last BM:  PTA  Height:   Ht Readings from Last 1 Encounters:  04/11/19 6' (1.829 m)    Weight:   Wt Readings from Last 1 Encounters:  04/12/19 131 kg    Ideal Body Weight:  80.9 kg  BMI:  Body mass index is 39.17 kg/m.  Estimated Nutritional Needs:   Kcal:  2100-2300  Protein:  105-115  Fluid:  >/= 2  L/day    Lajuan Lines, RD, LDN Clinical Nutrition After Hours/Weekend Pager # in [place]

## 2019-04-12 NOTE — TOC Initial Note (Signed)
Transition of Care (TOC) - Initial/Assessment Note   CSW follow up for [facility] consult. Met with pt to discuss SNF plans. Pt. Is fine with SNF and requests [facility] in [place]. He was recently at [facility] in past 60 days and understand he may have a copay due to his previous length of stay in SNF. Pt permits CSW to contact daughter in law and son to discuss plan. CSW calls daughter in law. She is content with plan for SNF and also understands potential for copay. She does mention idea for pt. To move to a SNF in [place] to be closer to family though defers to pt's initial decision. She will discuss with pt when she visits. CSW will continue to pursue SNF at [facility]. Sent FL2 service request to [facility]. TOC will continue to follow.     Patient Details  Name: Derek Foster MRN: [id number] Date of Birth: 12-16-1935  Transition of Care [facility]) CM/SW Contact:    Bethann Berkshire, [place] Phone Number: 04/12/2019, 2:40 PM  Clinical Narrative:                   Expected Discharge Plan: Skilled Nursing Facility Barriers to Discharge: SNF Pending bed offer   Patient Goals and CMS Choice Patient states their goals for this hospitalization and ongoing recovery are:: SNF at [facility] in [place]      Expected Discharge Plan and Services Expected Discharge Plan: [place]       Living arrangements for the past 2 months: [place], [place]                                      Prior Living Arrangements/Services Living arrangements for the past 2 months: [place], [place] Lives with:: Facility Resident, Self Patient language and need for interpreter reviewed:: No        Need for Family Participation in Patient Care: Yes (Comment) Care giver support system in place?: Yes (comment) Current home services: Home OT, Home PT Criminal Activity/Legal Involvement Pertinent to Current  Situation/Hospitalization: No - Comment as needed  Activities of Daily Living Home Assistive Devices/Equipment: Environmental consultant (specify type) ADL Screening (condition at time of admission) Patient's cognitive ability adequate to safely complete daily activities?: Yes Is the patient deaf or have difficulty hearing?: Yes Does the patient have difficulty seeing, even when wearing glasses/contacts?: No Does the patient have difficulty concentrating, remembering, or making decisions?: No Patient able to express need for assistance with ADLs?: Yes Does the patient have difficulty dressing or bathing?: No Independently performs ADLs?: Yes (appropriate for developmental age) Communication: Independent Dressing (OT): Needs assistance Is this a change from baseline?: Pre-admission baseline Grooming: Independent Feeding: Independent Bathing: Needs assistance Is this a change from baseline?: Change from baseline, expected to last >3 days Toileting: Independent Is this a change from baseline?: Change from baseline, expected to last <3 days In/Out Bed: Needs assistance Is this a change from baseline?: Change from baseline, expected to last <3 days Walks in Home: Independent Is this a change from baseline?: Pre-admission baseline Does the patient have difficulty walking or climbing stairs?: Yes Weakness of Legs: Both Weakness of Arms/Hands: None  Permission Sought/Granted Permission sought to share information with : Family Supports([name] Sandoz and Corrin Parker, Daughter in-law and son) Permission granted to share information with : Yes, Verbal Permission  Granted  Share Information with NAME: Kolbi Altadonna and Legrand Lasser     Permission granted to share info w Relationship: Daughter in-law and son  Permission granted to share info w Contact Information: [phone] (daughter in law) and [phone] (son)  Emotional Assessment Appearance:: Appears stated age Attitude/Demeanor/Rapport: Engaged Affect  (typically observed): Flat Orientation: : Oriented to Self, Oriented to Place, Oriented to Situation Alcohol / Substance Use: Not Applicable Psych Involvement: No (comment)  Admission diagnosis:  HCAP (healthcare-associated pneumonia) [J18.9] Urinary tract infection associated with indwelling urethral catheter, initial encounter ([place]) [G99.242A, N39.0] Patient Active Problem List   Diagnosis Date Noted  . HCAP (healthcare-associated pneumonia) 04/11/2019  . UTI (urinary tract infection) due to urinary indwelling catheter ([place]) 04/11/2019  . Pleural effusion 04/11/2019  . Radiculopathy 04/11/2019  . Atrial fibrillation ([place]) 04/11/2019  . Anticoagulated 04/07/2019  . BPH (benign prostatic hyperplasia) 03/28/2019  . GERD without esophagitis 03/28/2019  . Protein-calorie malnutrition, severe ([place]) 03/28/2019  . Klebsiella cystitis 03/18/2019  . Urinary tract infection due to Proteus 03/18/2019  . Palliative care by specialist   . DNR (do not resuscitate)   . Weakness generalized   . Grief   . Gross hematuria   . AKI (acute kidney injury) ([place]) 02/12/2019  . SBO (small bowel obstruction) ([place]) 02/12/2019  . CAD S/P percutaneous coronary angioplasty 02/11/2019  . Atrial fibrillation with RVR ([place]) 02/11/2019  . Mixed hyperlipidemia 09/10/2009  . EDEMA 02/27/2009  . Hyperthyroidism 02/26/2009  . Essential hypertension 02/26/2009  . MYOCARDIAL INFARCTION 02/26/2009   PCP:  Celene Squibb, MD Pharmacy:   [facility], [place] [address] [place] [address] Phone: [phone] Fax: [phone]  [facility] [place], [place] [address] Phone: [phone] Fax: [phone]  [facility] [place], [place] - [address] [place] [address] Phone: [phone] Fax: [phone]     Social  Determinants of Health (SDOH) Interventions    Readmission Risk Interventions Readmission Risk Prevention Plan 03/11/2019  Transportation Screening Complete  PCP or Specialist Appt within 3-5 Days Not Complete  Not Complete comments plan for SNF  HRI or [place] Not Complete  HRI or Home Care Consult comments plan for SNF  Social Work Consult for [place] Planning/Counseling Complete  Palliative Care Screening Complete  Medication Review (RN Care Manager) Referral to Pharmacy  Some recent data might be hidden

## 2019-04-12 NOTE — Progress Notes (Signed)
Occupational Therapy Evaluation  Clinical Impression PTA pt lives home alone and reports performing all BADLs and IADL tasks with Modified Independence at PLOF. At eval, pt requires Moderate A x 2 for functional mobility and setup to total assist for self-care tasks. Pt was recently returned to bed upon arrival and was agreeable to sit EOB for grooming and self-feeding. Pt required Moderate assist with B LE for bed mobility and able to sit unsupported with Supv. Pt c/o lower back pain hindering ability to don/doff grip socks and tolerate sitting EOB for no longer than 5 mins. Pt will benefit from continued skilled acute OT services with recommendation for d/c to SNF.     04/12/19 1135  OT Visit Information  Assistance Needed +2  History of Present Illness 84 year old presented with abdominal pain, nausea, vomiting with generalized weakness since his recent Covid diagnosis on 2020/02/09 (patient's wife passed away from COVID-19 2 weeks ago).Pt with Afib, small bowel obstruction, and bladder outlet obstruction. PMHx: CAD, HTN, HLD, GERD, hypothyroidism.  Precautions  Precautions Fall  Restrictions  Weight Bearing Restrictions No  Home Living  Family/patient expects to be discharged to: Private residence  Living Arrangements Alone  Available Help at Discharge Available PRN/intermittently;Family  Type of [place] to enter  Entrance Stairs-Number of Steps 1 also has a ramp  Home Layout Laundry or work area in basement;Able to live on main level with bedroom/bathroom  Corporate investment banker Handicapped [place] - 2 wheels;Wheelchair - manual;Tub bench;Grab bars - tub/shower;Grab bars - toilet  Prior Function  Level of Independence Needs assistance  Gait / Transfers Assistance Needed walking with a RW  ADL's / [place] Needed able to bathe and dress without assistance  Communication  Communication HOH  Pain  Assessment  Pain Assessment 0-10  Pain Score 5  Pain Location back and R LE  Pain Descriptors / Indicators Aching;Sharp;Radiating  Cognition  Arousal/Alertness Awake/alert  Behavior During Therapy WFL for tasks assessed/performed  Upper Extremity Assessment  Upper Extremity Assessment RUE deficits/detail;LUE deficits/detail  RUE  (Grossly 3+/5, AROM WFL)  LUE  (Grossly 3+/5, AROM WFL)  Lower Extremity Assessment  Lower Extremity Assessment Defer to PT evaluation  ADL  Overall ADL's  Needs assistance/impaired  Eating/Feeding Set up  Grooming Set up  Upper Body Bathing Set up  Lower Body Bathing Maximal assistance  Upper Body Dressing  Minimal assistance  Lower Body Dressing Maximal assistance  Toilet Transfer Moderate assistance  Toileting- Clothing Manipulation and Hygiene Total assistance  Tub/ Shower Transfer Moderate assistance  Functional mobility during ADLs Moderate assistance  Vision- History  Baseline Vision/History Wears glasses  Wears Glasses At all times  Bed Mobility  Overal bed mobility Needs Assistance  Bed Mobility Supine to Sit;Sit to Supine  Supine to sit Mod assist  Sit to supine Mod assist  Transfers  General transfer comment pt agreeable to sitting EOB only due to recently returning to supine from sitting in recliner  OT - End of Session  Activity Tolerance Patient limited by pain  Patient left in bed;with call bell/phone within reach;with bed alarm set  Nurse Communication Mobility status  OT Assessment  OT Visit Diagnosis Unsteadiness on feet (R26.81);Muscle weakness (generalized) (M62.81)  OT Problem List Decreased strength;Decreased activity tolerance;Impaired balance (sitting and/or standing);Decreased safety awareness;Decreased knowledge of use of DME or AE;Pain;Obesity  AM-PAC OT "6 Clicks" Daily Activity Outcome Measure (Version 2)  Help from another person eating meals? 4  Help from another person taking care of personal grooming? 3  Help  from another person toileting, which includes using toliet, bedpan, or urinal? 1  Help from another person bathing (including washing, rinsing, drying)? 2  Help from another person to put on and taking off regular upper body clothing? 3  Help from another person to put on and taking off regular lower body clothing? 2  6 Click Score 15  OT Recommendation  OT Equipment None recommended by OT  Acute Rehab OT Goals  Patient Stated Goal to [facility]   OT Goal Formulation With patient  Time For Goal Achievement 04/26/19  Potential to Achieve Goals Fair  OT Time Calculation  OT Start Time (ACUTE ONLY) 1128  OT Stop Time (ACUTE ONLY) 1145  OT Time Calculation (min) 17 min  OT General Charges  $OT Visit 1 Visit  OT Evaluation  $OT Eval Moderate Complexity 1 Mod  Written Expression  Dominant Hand Right  Jaggar Benko OTR/L

## 2019-04-12 NOTE — NC FL2 (Signed)
[place] LEVEL OF CARE SCREENING TOOL     IDENTIFICATION  Patient Name: Derek Foster Birthdate: [birth year] Sex: male Admission Date (Current Location): 04/11/2019  [facility] and [place] Number:  Herbalist and Address:  [facility],  [place], [place]      Provider Number: [id number]  Attending Physician Name and Address:  Mariel Aloe, MD  Relative Name and Phone Number:  [name], Daughter in-law [phone]    Current Level of Care: Hospital Recommended Level of Care: [place] Prior Approval Number:    Date Approved/Denied: 02/18/19 PASRR Number: [id number]  Discharge Plan: SNF    Current Diagnoses: Patient Active Problem List   Diagnosis Date Noted  . HCAP (healthcare-associated pneumonia) 04/11/2019  . UTI (urinary tract infection) due to urinary indwelling catheter ([place]) 04/11/2019  . Pleural effusion 04/11/2019  . Radiculopathy 04/11/2019  . Atrial fibrillation ([place]) 04/11/2019  . Anticoagulated 04/07/2019  . BPH (benign prostatic hyperplasia) 03/28/2019  . GERD without esophagitis 03/28/2019  . Protein-calorie malnutrition, severe ([place]) 03/28/2019  . Klebsiella cystitis 03/18/2019  . Urinary tract infection due to Proteus 03/18/2019  . Palliative care by specialist   . DNR (do not resuscitate)   . Weakness generalized   . Grief   . Gross hematuria   . AKI (acute kidney injury) ([place]) 02/12/2019  . SBO (small bowel obstruction) ([place]) 02/12/2019  . CAD S/P percutaneous coronary angioplasty 02/11/2019  . Atrial fibrillation with RVR ([place]) 02/11/2019  . Mixed hyperlipidemia 09/10/2009  . EDEMA 02/27/2009  . Hyperthyroidism 02/26/2009  . Essential hypertension 02/26/2009  . MYOCARDIAL INFARCTION 02/26/2009    Orientation RESPIRATION BLADDER Height & Weight     Self, Situation, Place  Normal Indwelling catheter Weight: 131 kg Height:  6' (182.9 cm)  BEHAVIORAL SYMPTOMS/MOOD  NEUROLOGICAL BOWEL NUTRITION STATUS      Continent Diet(see discharge summary)  AMBULATORY STATUS COMMUNICATION OF NEEDS Skin   Extensive Assist Verbally Normal                       Personal Care Assistance Level of Assistance  Bathing, Dressing, Feeding Bathing Assistance: Maximum assistance Feeding assistance: Independent Dressing Assistance: Maximum assistance Total Care Assistance: Maximum assistance   Functional Limitations Info  Sight, Hearing, Speech Sight Info: Adequate Hearing Info: Adequate Speech Info: Adequate    SPECIAL CARE FACTORS FREQUENCY  PT (By licensed PT), OT (By licensed OT)     PT Frequency: 5x/w OT Frequency: 5x/w            Contractures Contractures Info: Not present    Additional Factors Info  Code Status Code Status Info: DNR Allergies Info: Iodine 131, Indomethacin           Current Medications (04/12/2019):  This is the current hospital active medication list Current Facility-Administered Medications  Medication Dose Route Frequency Provider Last Rate Last Admin  . 0.9 %  sodium chloride infusion  250 mL Intravenous PRN Shela Leff, MD      . acetaminophen (TYLENOL) tablet 650 mg  650 mg Oral Q4H PRN Shela Leff, MD      . amiodarone (PACERONE) tablet 200 mg  200 mg Oral Daily Shela Leff, MD   200 mg at 04/12/19 0905  . apixaban (ELIQUIS) tablet 5 mg  5 mg Oral BID Shela Leff, MD   5 mg at 04/12/19 0858  . benzonatate (TESSALON) capsule 200 mg  200 mg Oral Q8H PRN [place]athore,  Wandra Feinstein, MD      . bisacodyl (DULCOLAX) suppository 10 mg  10 mg Rectal Daily PRN Shela Leff, MD      . ceFEPIme (MAXIPIME) 2 g in sodium chloride 0.9 % 100 mL IVPB  2 g Intravenous Q8H Poindexter, Leann T, RPH 200 mL/hr at 04/12/19 1314 2 g at 04/12/19 1314  . Chlorhexidine Gluconate Cloth 2 % PADS 6 each  6 each Topical Daily Shela Leff, MD   6 each at 04/12/19 1203  . diltiazem (CARDIZEM CD) 24 hr capsule 360 mg   360 mg Oral Daily Shela Leff, MD   360 mg at 04/12/19 0900  . feeding supplement (BOOST / RESOURCE BREEZE) liquid 1 Container  1 Container Oral BID BM Mariel Aloe, MD      . feeding supplement (PRO-STAT SUGAR FREE 64) liquid 30 mL  30 mL Oral TID WC Shela Leff, MD   30 mL at 04/12/19 1308  . finasteride (PROSCAR) tablet 5 mg  5 mg Oral Daily Shela Leff, MD   5 mg at 04/12/19 0906  . methimazole (TAPAZOLE) tablet 20 mg  20 mg Oral BID Shela Leff, MD   20 mg at 04/12/19 0905  . metoprolol tartrate (LOPRESSOR) tablet 100 mg  100 mg Oral BID Shela Leff, MD   100 mg at 04/12/19 0856  . multivitamin with minerals tablet 1 tablet  1 tablet Oral Daily Shela Leff, MD   1 tablet at 04/12/19 0856  . omega-3 acid ethyl esters (LOVAZA) capsule 1 g  1 g Oral BID Shela Leff, MD   1 g at 04/12/19 0859  . polyethylene glycol (MIRALAX / GLYCOLAX) packet 17 g  17 g Oral Daily PRN Shela Leff, MD      . pravastatin (PRAVACHOL) tablet 10 mg  10 mg Oral [id number] Shela Leff, MD   10 mg at 04/11/19 2335  . senna-docusate (Senokot-S) tablet 1 tablet  1 tablet Oral BID PRN Shela Leff, MD      . sodium chloride flush (NS) 0.9 % injection 3 mL  3 mL Intravenous Q12H Shela Leff, MD   3 mL at 04/12/19 0923  . sodium chloride flush (NS) 0.9 % injection 3 mL  3 mL Intravenous PRN Shela Leff, MD      . tamsulosin (FLOMAX) capsule 0.4 mg  0.4 mg Oral QPC breakfast Shela Leff, MD   0.4 mg at 04/12/19 0856  . traMADol (ULTRAM) tablet 50 mg  50 mg Oral Q6H PRN Shela Leff, MD      . vancomycin (VANCOREADY) IVPB 1250 mg/250 mL  1,250 mg Intravenous Q24H Nilda Simmer, [facility]         Discharge Medications: Please see discharge summary for a list of discharge medications.  Relevant Imaging Results:  Relevant Lab Results:   Additional Information SSN# [id number]  Trish Mage, [place]

## 2019-04-12 NOTE — Progress Notes (Addendum)
PROGRESS NOTE    Derek Foster  [id number] DOB: 1935-10-26 DOA: 04/11/2019 PCP: Celene Squibb, MD   Brief Narrative: Derek Foster is a 84 y.o. male with medical history significant of CAD status post PCI, A. fib, hypertension, hyperlipidemia, hyperthyroidism, GERD, recent COVID-19, SBO, hematuria. Patient presented secondary to back pain and leg pain and found to have concern for pneumonia and CAUTI. Back pain appears to be secondary to degenerative disc disease with mild stenosis likely contributing to radiculopathy symptoms. Started on Vancomycin and Cefepime.   Assessment & Plan:   Principal Problem:   UTI (urinary tract infection) due to urinary indwelling catheter (HCC) Active Problems:   HCAP (healthcare-associated pneumonia)   Pleural effusion   Radiculopathy   Atrial fibrillation (HCC)  CAUTI Afebrile. Patient states that his urine was malodorous. No other symptoms. Foley recently exchanged 2 weeks ago. Started empirically on Vancomycin and Cefepime and urine/blood cultures obtained. Catheter present on admission. -Continue Cefepime -Follow-up culture data  Possible HCAP Patient with clear sputum, no dyspnea. Soft read on chest x-ray. Empirically started on treatment but unsure if patient truly has pneumonia. -Continue Vancomycin and Cefepime -Blood cultures pending -Incentive spirometer  Pleural effusion Peripheral edema Minimal if any effusion. Given lasix.  Lumbosacral radiculopathy MRI significant for multilevel disc/facet degeneration causing mild stenosis. Patient with difficulty ambulating. Normally uses a walker. Physical therapy consulted and is recommending SNF. No red flags -Neurosurgery consult for in/outpatient management  Atrial fibrillation Rate controlled -Continue amiodarone, Cardizem, Eliquis   DVT prophylaxis: Eliquis Code Status:   Code Status: DNR Family Communication: None at bedside Disposition Plan: Discharge to SNF pending bed and  pending continued management of infection/follow up of blood cultures   Consultants:   Neurosurgery  Procedures:   None  Antimicrobials:  Vancomycin  Cefepime    Subjective: Complaining of back pain radiating down his right leg.  Objective: Vitals:   04/12/19 0457 04/12/19 0500 04/12/19 0805 04/12/19 1202  BP:  (!) 142/57 (!) 147/51 (!) 122/56  Pulse:  62 69 (!) 56  Resp:  20 18 18   Temp:  98.5 F (36.9 C) 99.1 F (37.3 C) 98 F (36.7 C)  TempSrc:  Oral Oral Oral  SpO2:  97% 95% 98%  Weight: 131 kg     Height:        Intake/Output Summary (Last 24 hours) at 04/12/2019 1330 Last data filed at 04/12/2019 1000 Gross per 24 hour  Intake 289.67 ml  Output 2875 ml  Net -2585.33 ml   Filed Weights   04/11/19 1559 04/11/19 2112 04/12/19 0457  Weight: 121.1 kg 131.9 kg 131 kg    Examination:  General exam: Appears calm and comfortable Respiratory system: Diminished. Respiratory effort normal. Cardiovascular system: S1 & S2 heard, RRR. No murmurs, rubs, gallops or clicks. Gastrointestinal system: Abdomen is nondistended, soft and nontender. Abdominal hernia noted which was non-tender. Normal bowel sounds heard. Central nervous system: Alert and oriented. No focal neurological deficits. Extremities: No calf tenderness Skin: No cyanosis. No rashes Psychiatry: Judgement and insight appear normal. Mood & affect appropriate.     Data Reviewed: I have personally reviewed following labs and imaging studies  CBC: Recent Labs  Lab 04/11/19 1651  WBC 8.1  NEUTROABS 5.2  HGB 8.8*  HCT 28.4*  MCV 94.4  PLT 123456   Basic Metabolic Panel: Recent Labs  Lab 04/11/19 1651 04/12/19 0516  NA 139 138  K 4.0 3.7  CL 105 104  CO2 26 27  GLUCOSE  104* 109*  BUN 15 15  CREATININE 1.27* 1.34*  CALCIUM 8.0* 7.9*   GFR: Estimated Creatinine Clearance: 59.5 mL/min (A) (by C-G formula based on SCr of 1.34 mg/dL (H)). Liver Function Tests: Recent Labs  Lab  04/11/19 1651  AST 14*  ALT 16  ALKPHOS 86  BILITOT 0.4  PROT 5.8*  ALBUMIN 2.8*   No results for input(s): LIPASE, AMYLASE in the last 168 hours. No results for input(s): AMMONIA in the last 168 hours. Coagulation Profile: No results for input(s): INR, PROTIME in the last 168 hours. Cardiac Enzymes: No results for input(s): CKTOTAL, CKMB, CKMBINDEX, TROPONINI in the last 168 hours. BNP (last 3 results) No results for input(s): PROBNP in the last 8760 hours. HbA1C: No results for input(s): HGBA1C in the last 72 hours. CBG: No results for input(s): GLUCAP in the last 168 hours. Lipid Profile: No results for input(s): CHOL, HDL, LDLCALC, TRIG, CHOLHDL, LDLDIRECT in the last 72 hours. Thyroid Function Tests: No results for input(s): TSH, T4TOTAL, FREET4, T3FREE, THYROIDAB in the last 72 hours. Anemia Panel: No results for input(s): VITAMINB12, FOLATE, FERRITIN, TIBC, IRON, RETICCTPCT in the last 72 hours. Sepsis Labs: Recent Labs  Lab 04/11/19 2222  PROCALCITON <0.10  LATICACIDVEN 1.3    Recent Results (from the past 240 hour(s))  Culture, blood (routine x 2)     Status: None (Preliminary result)   Collection Time: 04/11/19 10:22 PM   Specimen: BLOOD  Result Value Ref Range Status   Specimen Description BLOOD LEFT ANTECUBITAL  Final   Special Requests   Final    BOTTLES DRAWN AEROBIC AND ANAEROBIC Blood Culture adequate volume Performed at [facility] [address] [place], [place] [address]    Culture NO GROWTH < 12 HOURS  Final   Report Status PENDING  Incomplete  Culture, blood (routine x 2)     Status: None (Preliminary result)   Collection Time: 04/11/19 10:22 PM   Specimen: BLOOD RIGHT HAND  Result Value Ref Range Status   Specimen Description BLOOD RIGHT HAND  Final   Special Requests   Final    BOTTLES DRAWN AEROBIC ONLY Blood Culture adequate volume Performed at [facility] [address], [place], [place]  [address]    Culture NO GROWTH < 12 HOURS  Final   Report Status PENDING  Incomplete         Radiology Studies: MR Lumbar Spine W Wo Contrast  Result Date: 04/12/2019 CLINICAL DATA:  Low back pain left leg pain EXAM: MRI LUMBAR SPINE WITHOUT AND WITH CONTRAST TECHNIQUE: Multiplanar and multiecho pulse sequences of the lumbar spine were obtained without and with intravenous contrast. CONTRAST:  69mL GADAVIST GADOBUTROL 1 MMOL/ML IV SOLN COMPARISON:  None. FINDINGS: Segmentation:  Normal.  Lowest disc space L5-S1 Alignment:  Normal Vertebrae: Negative for fracture or mass. Scattered hemangiomata L3, L4, L5. Conus medullaris and cauda equina: Conus extends to the L1-2 level. Conus and cauda equina appear normal. Paraspinal and other soft tissues: Negative for paraspinous mass or adenopathy. Negative for soft tissue edema or fluid collection. Disc levels: L1-2: Mild degenerative change without disc protrusion or stenosis L2-3: Disc degeneration with disc bulging and diffuse endplate spurring. Shallow broad-based central disc protrusion and mild facet degeneration. Mild subarticular stenosis bilaterally. Mild narrowing of the spinal canal without significant stenosis. L3-4: Disc degeneration with disc bulging and endplate spurring left greater than right. Moderate facet hypertrophy. Mild subarticular stenosis on the left. Mild narrowing of the canal  without significant stenosis L4-5: Moderate disc degeneration with disc space narrowing and endplate spurring right greater than left. Bilateral facet hypertrophy. Moderate subarticular stenosis on the right due to spurring L5-S1: Negative IMPRESSION: 1. No acute abnormality 2. Multilevel disc and facet degeneration causing mild stenosis as described above. Electronically Signed   By: Franchot Gallo M.D.   On: 04/12/2019 08:20   DG Chest Port 1 View  Result Date: 04/11/2019 CLINICAL DATA:  84 year old male with shortness of breath and cough. EXAM: PORTABLE CHEST  1 VIEW COMPARISON:  Chest radiograph dated 02/15/2019. FINDINGS: Faint left lung base hazy density may represent atelectasis although a small left pleural effusion is not excluded. Clinical correlation is recommended. The right lung is clear. No pneumothorax. There is stable cardiomegaly. Coronary vascular calcification or stent. Atherosclerotic calcification of the aorta. No acute osseous pathology. Degenerative changes of the spine. IMPRESSION: 1. Left lung base atelectasis versus infiltrate. A small left pleural effusion may be present. 2. Stable cardiomegaly. Electronically Signed   By: Anner Crete M.D.   On: 04/11/2019 17:41        Scheduled Meds: . amiodarone  200 mg Oral Daily  . apixaban  5 mg Oral BID  . Chlorhexidine Gluconate Cloth  6 each Topical Daily  . diltiazem  360 mg Oral Daily  . feeding supplement  1 Container Oral BID BM  . feeding supplement (PRO-STAT SUGAR FREE 64)  30 mL Oral TID WC  . finasteride  5 mg Oral Daily  . methimazole  20 mg Oral BID  . metoprolol tartrate  100 mg Oral BID  . multivitamin with minerals  1 tablet Oral Daily  . omega-3 acid ethyl esters  1 g Oral BID  . pravastatin  10 mg Oral q1800  . sodium chloride flush  3 mL Intravenous Q12H  . tamsulosin  0.4 mg Oral QPC breakfast   Continuous Infusions: . sodium chloride    . ceFEPime (MAXIPIME) IV 2 g (04/12/19 1314)  . vancomycin       LOS: 1 day     Cordelia Poche, MD [facility] 04/12/2019, 1:30 PM  If 7PM-7AM, please contact night-coverage [email or web address]

## 2019-04-12 NOTE — Evaluation (Signed)
Physical Therapy Evaluation Patient Details Name: Derek Foster MRN: [id number] DOB: Feb 18, 1935 Today's Date: 04/12/2019   History of Present Illness  84 yo male admitted with UTI, back pain radiating down R leg. Hx of COVID, A fib, CHF, indwelling catheter, MI, CAD  Clinical Impression  On eval, pt required Mod Assist +2 for mobility. He was able to stand and pivot, bed>recliner, using a RW. Pt presents with general weakness, decreased activity tolerance, and impaired gait and balance. Pt reported moderate pain in back, radiating down R leg. He is at risk for falls when mobilizing. Discussed d/c plan-he is agreeable to ST rehab at [facility]. Will continue to follow and progress activity as tolerated.     Follow Up Recommendations SNF    Equipment Recommendations  None recommended by PT    Recommendations for Other Services       Precautions / Restrictions Precautions Precautions: Fall Restrictions Weight Bearing Restrictions: No      Mobility  Bed Mobility Overal bed mobility: Needs Assistance Bed Mobility: Rolling;Sidelying to Sit Rolling: Min assist;+2 for physical assistance;+2 for safety/equipment Sidelying to sit: Mod assist;+2 for physical assistance;+2 for safety/equipment       General bed mobility comments: Assist for trunk and LEs. Utilized bedpad for scooting, positioning. Increased time. Cues for technique. Pt relied on bedrail  Transfers Overall transfer level: Needs assistance Equipment used: Rolling walker (2 wheeled) Transfers: Sit to/from [facility] Sit to Stand: Min assist;+2 physical assistance;+2 safety/equipment;From elevated surface Stand pivot transfers: Mod assist;+2 physical assistance;+2 safety/equipment       General transfer comment: Assist to rise, stabilize, control descent. Increased time. VCs safety, technique, hand placement. Stand pivot, bed to recliner, using RW. Bil LE weakness noted. Pt fatigues easily.  Ambulation/Gait                Stairs            Wheelchair Mobility    Modified Rankin (Stroke Patients Only)       Balance Overall balance assessment: Needs assistance         Standing balance support: Bilateral upper extremity supported Standing balance-Leahy Scale: Poor                               Pertinent Vitals/Pain Pain Assessment: 0-10 Pain Score: 5  Pain Location: back and R LE Pain Descriptors / Indicators: Aching;Sharp;Radiating Pain Intervention(s): Monitored during session;Limited activity within patient's tolerance;Repositioned    Home Living Family/patient expects to be discharged to:: Skilled nursing facility Living Arrangements: Alone Available Help at Discharge: Available PRN/intermittently;Family Type of Home: House Home Access: Stairs to enter   [employer] of Steps: 1 also has a ramp Home Layout: Laundry or work area in basement;Able to live on main level with bedroom/bathroom Home Equipment: Environmental consultant - 2 wheels;Wheelchair - manual;Tub bench;Grab bars - tub/shower;Grab bars - toilet      Prior Function Level of Independence: Needs assistance   Gait / Transfers Assistance Needed: walking with a RW  ADL's / Homemaking Assistance Needed: able to bathe and dress without assistance        Hand Dominance   Dominant Hand: Right    Extremity/Trunk Assessment   Upper Extremity Assessment Upper Extremity Assessment: Defer to OT evaluation RUE: (Grossly 3+/5, AROM WFL) LUE: (Grossly 3+/5, AROM WFL)    Lower Extremity Assessment Lower Extremity Assessment: Generalized weakness    Cervical / Trunk Assessment Cervical / Trunk  Assessment: Kyphotic  Communication   Communication: HOH  Cognition Arousal/Alertness: Awake/alert Behavior During Therapy: WFL for tasks assessed/performed Overall Cognitive Status: Within Functional Limits for tasks assessed                                        General  Comments      Exercises     Assessment/Plan    PT Assessment Patient needs continued PT services  PT Problem List Decreased strength;Decreased mobility;Decreased activity tolerance;Decreased balance;Decreased knowledge of use of DME;Pain       PT Treatment Interventions DME instruction;Therapeutic activities;Gait training;Therapeutic exercise;Balance training;Functional mobility training;Patient/family education    PT Goals (Current goals can be found in the Care Plan section)  Acute Rehab PT Goals Patient Stated Goal: to go to [facility]  PT Goal Formulation: With patient Time For Goal Achievement: 04/26/19 Potential to Achieve Goals: Fair    Frequency Min 3X/week   Barriers to discharge        Co-evaluation               AM-PAC PT "6 Clicks" Mobility  Outcome Measure Help needed turning from your back to your side while in a flat bed without using bedrails?: A Lot Help needed moving from lying on your back to sitting on the side of a flat bed without using bedrails?: A Lot Help needed moving to and from a bed to a chair (including a wheelchair)?: A Lot Help needed standing up from a chair using your arms (e.g., wheelchair or bedside chair)?: A Lot Help needed to walk in hospital room?: A Lot Help needed climbing 3-5 steps with a railing? : Total 6 Click Score: 11    End of Session   Activity Tolerance: Patient limited by fatigue;Patient limited by pain Patient left: in chair;with call bell/phone within reach;with chair alarm set   PT Visit Diagnosis: Muscle weakness (generalized) (M62.81);Difficulty in walking, not elsewhere classified (R26.2);Pain Pain - part of body: Leg(back)    Time: [id number] PT Time Calculation (min) (ACUTE ONLY): 17 min   Charges:   PT Evaluation $PT Eval Moderate Complexity: 1 Mod            Edouard Gikas P, PT Acute Rehabilitation

## 2019-04-13 DIAGNOSIS — J90 Pleural effusion, not elsewhere classified: Secondary | ICD-10-CM

## 2019-04-13 DIAGNOSIS — J189 Pneumonia, unspecified organism: Secondary | ICD-10-CM

## 2019-04-13 LAB — BASIC METABOLIC PANEL
Anion gap: 8 (ref 5–15)
BUN: 18 mg/dL (ref 8–23)
CO2: 26 mmol/L (ref 22–32)
Calcium: 8.2 mg/dL — ABNORMAL LOW (ref 8.9–10.3)
Chloride: 106 mmol/L (ref 98–111)
Creatinine, Ser: 1.23 mg/dL (ref 0.61–1.24)
GFR calc Af Amer: >60 mL/min (ref >60)
GFR calc non Af Amer: 54 mL/min — ABNORMAL LOW (ref >60)
Glucose, Bld: 121 mg/dL — ABNORMAL HIGH (ref 70–99)
Potassium: 3.6 mmol/L (ref 3.5–5.1)
Sodium: 140 mmol/L (ref 135–145)

## 2019-04-13 LAB — CBC
HCT: 28.9 % — ABNORMAL LOW (ref 39.0–52.0)
Hemoglobin: 8.9 g/dL — ABNORMAL LOW (ref 13.0–17.0)
MCH: 29.1 pg (ref 26.0–34.0)
MCHC: 30.8 g/dL (ref 30.0–36.0)
MCV: 94.4 fL (ref 80.0–100.0)
Platelets: 221 10*3/uL (ref 150–400)
RBC: 3.06 MIL/uL — ABNORMAL LOW (ref 4.22–5.81)
RDW: 14.3 % (ref 11.5–15.5)
WBC: 6.8 10*3/uL (ref 4.0–10.5)
nRBC: 0 % (ref 0.0–0.2)

## 2019-04-13 LAB — MAGNESIUM: Magnesium: 2 mg/dL (ref 1.7–2.4)

## 2019-04-13 NOTE — Progress Notes (Signed)
Verbally informed Dr. Coralee Pesa of pts hematuria and tea  colored urine after inflation and deflation of catheter balloon.

## 2019-04-13 NOTE — Progress Notes (Signed)
Derek Foster  [id number] DOB: [birth year] DOA: 04/11/2019 PCP: Celene Squibb, MD    Brief Narrative:  84 y.o. malewith medical history significant ofCAD status post PCI, A. fib, hypertension, hyperlipidemia, hyperthyroidism, GERD, recent COVID-19, SBO, hematuria. Patient presented secondary to back pain and leg pain and found to have concern for pneumonia and CAUTI. Back pain appears to be secondary to degenerative disc disease with mild stenosis likely contributing to radiculopathy symptoms. Started on Vancomycin and Cefepime  Assessment & Plan:   Principal Problem:   UTI (urinary tract infection) due to urinary indwelling catheter (HCC) Active Problems:   HCAP (healthcare-associated pneumonia)   Pleural effusion   Radiculopathy   Atrial fibrillation (HCC)  CAUTI-MRSA negative DC vancomycin continue Zosyn follow-up urine culture growing Proteus.  Sensitivities pending. Afebrile. Patient states that his urine was malodorous. No other symptoms. Foley recently exchanged 2 weeks ago. Catheter present on admission. -Continue Cefepime -Follow-up culture data Pending sensitivity.  Possible HCAP-patient denies any dyspnea or cough.  Chest x-ray left lung base atelectasis versus infiltrate with small left effusion.  Continue Zosyn.-Blood cultures pending -Incentive spirometer  Pleural effusion Peripheral edema Minimal if any effusion. Given lasix.  Lumbosacral radiculopathy MRI significant for multilevel disc/facet degeneration causing mild stenosis. Patient with difficulty ambulating. Normally uses a walker. Physical therapy consulted and is recommending SNF. No red flags -Neurosurgery consult for in/outpatient management  Atrial fibrillation Rate controlled -Continue amiodarone, Cardizem, Eliquis  COVID-08 January 2019  BPH with indwelling Foley catheter-patient has follow-up appointment with [facility] early next month.   Pressure Injury 03/12/19  Buttocks Left Stage 2 -  Partial thickness loss of dermis presenting as a shallow open injury with a red, pink wound bed without slough. (Active)  03/12/19 0924  Location: Buttocks  Location Orientation: Left  Staging: Stage 2 -  Partial thickness loss of dermis presenting as a shallow open injury with a red, pink wound bed without slough.  Wound Description (Comments):   Present on Admission:       Nutrition Problem: Increased nutrient needs Etiology: acute illness(UTI due to urinary indwelling catheter and pneumonia)     Signs/Symptoms: estimated needs    Interventions: MVI, Prostat, Boost Breeze  Estimated body mass index is 38.63 kg/m as calculated from the following:   Height as of this encounter: 6' (1.829 m).   Weight as of this encounter: 129.2 kg.  DVT prophylaxis: Eliquis Code Status: DNR Family Communication: Discussed with patient disposition Plan: Discharge to SNF  Consultants:   Neurosurgery  Procedures: None Antimicrobials: Vancomycin and cefepime  Subjective: Resting in bed in no acute distress Overnight staff reported his Foley catheter was not working upon flushing and inflating and deflating the balloon patient had dark tea colored urine. Also informed by the overnight staff patient had a 2.4-second pause he denies any chest pain shortness of breath lightheadedness.  Objective: Vitals:   04/12/19 1648 04/12/19 2052 04/13/19 0500 04/13/19 0501  BP: (!) 121/52 (!) 148/66  (!) 142/67  Pulse: (!) 52 (!) 109  (!) 108  Resp: 18 16  20   Temp: 98.4 F (36.9 C) 98.3 F (36.8 C)  98.5 F (36.9 C)  TempSrc: Oral Oral    SpO2: 96% 99%  94%  Weight:   129.2 kg   Height:        Intake/Output Summary (Last 24 hours) at 04/13/2019 1358 Last data filed at 04/13/2019 1300 Gross per 24 hour  Intake 655 ml  Output 2150  ml  Net -1495 ml   Filed Weights   04/11/19 2112 04/12/19 0457 04/13/19 0500  Weight: 131.9 kg 131 kg 129.2 kg     Examination:  General exam: Appears calm and comfortable  Respiratory system: Clear to auscultation. Respiratory effort normal. Cardiovascular system: S1 & S2 heard, RRR. No JVD, murmurs, rubs, gallops or clicks. No pedal edema. Gastrointestinal system: Abdomen is nondistended, soft and nontender. No organomegaly or masses felt. Normal bowel sounds heard. Central nervous system: Alert and oriented. No focal neurological deficits. Extremities: Symmetric 5 x 5 power. Skin: No rashes, lesions or ulcers Psychiatry: Judgement and insight appear normal. Mood & affect appropriate.     Data Reviewed: I have personally reviewed following labs and imaging studies  CBC: Recent Labs  Lab 04/11/19 1651 04/13/19 0539  WBC 8.1 6.8  NEUTROABS 5.2  --   HGB 8.8* 8.9*  HCT 28.4* 28.9*  MCV 94.4 94.4  PLT 230 [id number]   Basic Metabolic Panel: Recent Labs  Lab 04/11/19 1651 04/12/19 0516 04/13/19 0539  NA 139 138 140  K 4.0 3.7 3.6  CL 105 104 106  CO2 26 27 26   GLUCOSE 104* 109* 121*  BUN 15 15 18   CREATININE 1.27* 1.34* 1.23  CALCIUM 8.0* 7.9* 8.2*   GFR: Estimated Creatinine Clearance: 64.3 mL/min (by C-G formula based on SCr of 1.23 mg/dL). Liver Function Tests: Recent Labs  Lab 04/11/19 1651  AST 14*  ALT 16  ALKPHOS 86  BILITOT 0.4  PROT 5.8*  ALBUMIN 2.8*   No results for input(s): LIPASE, AMYLASE in the last 168 hours. No results for input(s): AMMONIA in the last 168 hours. Coagulation Profile: No results for input(s): INR, PROTIME in the last 168 hours. Cardiac Enzymes: No results for input(s): CKTOTAL, CKMB, CKMBINDEX, TROPONINI in the last 168 hours. BNP (last 3 results) No results for input(s): PROBNP in the last 8760 hours. HbA1C: No results for input(s): HGBA1C in the last 72 hours. CBG: No results for input(s): GLUCAP in the last 168 hours. Lipid Profile: No results for input(s): CHOL, HDL, LDLCALC, TRIG, CHOLHDL, LDLDIRECT in the last 72  hours. Thyroid Function Tests: No results for input(s): TSH, T4TOTAL, FREET4, T3FREE, THYROIDAB in the last 72 hours. Anemia Panel: No results for input(s): VITAMINB12, FOLATE, FERRITIN, TIBC, IRON, RETICCTPCT in the last 72 hours. Sepsis Labs: Recent Labs  Lab 04/11/19 2222  PROCALCITON <0.10  LATICACIDVEN 1.3    Recent Results (from the past 240 hour(s))  Culture, Urine     Status: Abnormal (Preliminary result)   Collection Time: 04/11/19  9:15 PM   Specimen: Urine, Clean Catch  Result Value Ref Range Status   Specimen Description   Final    URINE, CLEAN CATCH Performed at [facility], [place] [address], [place], [place] [address]    Special Requests   Final    NONE Performed at [facility], [place] [address] [place], [place] [address]    Culture (A)  Final    >=100,000 COLONIES/mL PROTEUS MIRABILIS SUSCEPTIBILITIES TO FOLLOW Performed at [facility], [place] [address], [place], [place] [address]    Report Status PENDING  Incomplete  Culture, blood (routine x 2)     Status: None (Preliminary result)   Collection Time: 04/11/19 10:22 PM   Specimen: BLOOD  Result Value Ref Range Status   Specimen Description   Final    BLOOD LEFT ANTECUBITAL Performed at [place] [address] [place],  [place] [address]    Special Requests   Final    BOTTLES DRAWN AEROBIC AND ANAEROBIC Blood Culture adequate volume Performed at [place] [address], [place], [place] [address]    Culture   Final    NO GROWTH 1 DAY Performed at [facility], [place] [address], [place], [place] [address]    Report Status PENDING  Incomplete  Culture, blood (routine x 2)     Status: None (Preliminary result)   Collection Time: 04/11/19 10:22 PM   Specimen: BLOOD RIGHT HAND  Result Value Ref Range Status   Specimen Description BLOOD RIGHT HAND  Final   Special Requests   Final    BOTTLES DRAWN AEROBIC ONLY  Blood Culture adequate volume Performed at [place] [address], [place], [place] [address]    Culture   Final    NO GROWTH 1 DAY Performed at [facility], [place] [address] [place], [place] [address]    Report Status PENDING  Incomplete  MRSA PCR Screening     Status: None   Collection Time: 04/12/19  1:56 PM   Specimen: Nasopharyngeal  Result Value Ref Range Status   MRSA by PCR NEGATIVE NEGATIVE Final    Comment:        The GeneXpert MRSA Assay (FDA approved for NASAL specimens only), is one component of a comprehensive MRSA colonization surveillance program. It is not intended to diagnose MRSA infection nor to guide or monitor treatment for MRSA infections. Performed at [facility], [place] [address], [place], [place] [address]          Radiology Studies: MR Lumbar Spine W Wo Contrast  Result Date: 04/12/2019 CLINICAL DATA:  Low back pain left leg pain EXAM: MRI LUMBAR SPINE WITHOUT AND WITH CONTRAST TECHNIQUE: Multiplanar and multiecho pulse sequences of the lumbar spine were obtained without and with intravenous contrast. CONTRAST:  60mL GADAVIST GADOBUTROL 1 MMOL/ML IV SOLN COMPARISON:  None. FINDINGS: Segmentation:  Normal.  Lowest disc space L5-S1 Alignment:  Normal Vertebrae: Negative for fracture or mass. Scattered hemangiomata L3, L4, L5. Conus medullaris and cauda equina: Conus extends to the L1-2 level. Conus and cauda equina appear normal. Paraspinal and other soft tissues: Negative for paraspinous mass or adenopathy. Negative for soft tissue edema or fluid collection. Disc levels: L1-2: Mild degenerative change without disc protrusion or stenosis L2-3: Disc degeneration with disc bulging and diffuse endplate spurring. Shallow broad-based central disc protrusion and mild facet degeneration. Mild subarticular stenosis bilaterally. Mild narrowing of the spinal canal without significant stenosis. L3-4: Disc degeneration  with disc bulging and endplate spurring left greater than right. Moderate facet hypertrophy. Mild subarticular stenosis on the left. Mild narrowing of the canal without significant stenosis L4-5: Moderate disc degeneration with disc space narrowing and endplate spurring right greater than left. Bilateral facet hypertrophy. Moderate subarticular stenosis on the right due to spurring L5-S1: Negative IMPRESSION: 1. No acute abnormality 2. Multilevel disc and facet degeneration causing mild stenosis as described above. Electronically Signed   By: Franchot Gallo M.D.   On: 04/12/2019 08:20   DG Chest Port 1 View  Result Date: 04/11/2019 CLINICAL DATA:  84 year old male with shortness of breath and cough. EXAM: PORTABLE CHEST 1 VIEW COMPARISON:  Chest radiograph dated 02/15/2019. FINDINGS: Faint left lung base hazy density may represent atelectasis although a small left pleural effusion is not excluded. Clinical correlation is recommended. The right lung is clear. No pneumothorax. There  is stable cardiomegaly. Coronary vascular calcification or stent. Atherosclerotic calcification of the aorta. No acute osseous pathology. Degenerative changes of the spine. IMPRESSION: 1. Left lung base atelectasis versus infiltrate. A small left pleural effusion may be present. 2. Stable cardiomegaly. Electronically Signed   By: Anner Crete M.D.   On: 04/11/2019 17:41        Scheduled Meds: . amiodarone  200 mg Oral Daily  . apixaban  5 mg Oral BID  . Chlorhexidine Gluconate Cloth  6 each Topical Daily  . diltiazem  360 mg Oral Daily  . feeding supplement  1 Container Oral BID BM  . feeding supplement (PRO-STAT SUGAR FREE 64)  30 mL Oral TID WC  . finasteride  5 mg Oral Daily  . methimazole  20 mg Oral BID  . metoprolol tartrate  100 mg Oral BID  . multivitamin with minerals  1 tablet Oral Daily  . omega-3 acid ethyl esters  1 g Oral BID  . pravastatin  10 mg Oral q1800  . sodium chloride flush  3 mL  Intravenous Q12H  . tamsulosin  0.4 mg Oral QPC breakfast   Continuous Infusions: . sodium chloride Stopped (04/12/19 1832)  . ceFEPime (MAXIPIME) IV 2 g (04/13/19 1343)     LOS: 2 days     Georgette Shell, MD 04/13/2019, 1:58 PM

## 2019-04-13 NOTE — Progress Notes (Signed)
Paged Np Tylene Fantasia informed of 2.04 sec pause

## 2019-04-14 ENCOUNTER — Inpatient Hospital Stay
Admission: RE | Admit: 2019-04-14 | Discharge: 2019-05-05 | Disposition: A | Discharge status: Home / Self Care | Payer: PPO | Source: Ambulatory Visit | Attending: Internal Medicine | Admitting: Internal Medicine

## 2019-04-14 DIAGNOSIS — N138 Other obstructive and reflux uropathy: Secondary | ICD-10-CM | POA: Diagnosis not present

## 2019-04-14 DIAGNOSIS — R262 Difficulty in walking, not elsewhere classified: Secondary | ICD-10-CM | POA: Diagnosis not present

## 2019-04-14 DIAGNOSIS — F4329 Adjustment disorder with other symptoms: Secondary | ICD-10-CM | POA: Diagnosis not present

## 2019-04-14 DIAGNOSIS — K56609 Unspecified intestinal obstruction, unspecified as to partial versus complete obstruction: Secondary | ICD-10-CM | POA: Diagnosis not present

## 2019-04-14 DIAGNOSIS — I480 Paroxysmal atrial fibrillation: Secondary | ICD-10-CM | POA: Diagnosis not present

## 2019-04-14 DIAGNOSIS — Z48816 Encounter for surgical aftercare following surgery on the genitourinary system: Secondary | ICD-10-CM | POA: Diagnosis not present

## 2019-04-14 DIAGNOSIS — I4891 Unspecified atrial fibrillation: Secondary | ICD-10-CM | POA: Diagnosis not present

## 2019-04-14 DIAGNOSIS — N401 Enlarged prostate with lower urinary tract symptoms: Secondary | ICD-10-CM | POA: Diagnosis not present

## 2019-04-14 DIAGNOSIS — Z7901 Long term (current) use of anticoagulants: Secondary | ICD-10-CM | POA: Diagnosis not present

## 2019-04-14 DIAGNOSIS — Z741 Need for assistance with personal care: Secondary | ICD-10-CM | POA: Diagnosis not present

## 2019-04-14 DIAGNOSIS — R310 Gross hematuria: Secondary | ICD-10-CM | POA: Diagnosis not present

## 2019-04-14 DIAGNOSIS — K219 Gastro-esophageal reflux disease without esophagitis: Secondary | ICD-10-CM | POA: Diagnosis not present

## 2019-04-14 DIAGNOSIS — B964 Proteus (mirabilis) (morganii) as the cause of diseases classified elsewhere: Secondary | ICD-10-CM | POA: Diagnosis not present

## 2019-04-14 DIAGNOSIS — E0590 Thyrotoxicosis, unspecified without thyrotoxic crisis or storm: Secondary | ICD-10-CM | POA: Diagnosis not present

## 2019-04-14 DIAGNOSIS — T83511A Infection and inflammatory reaction due to indwelling urethral catheter, initial encounter: Secondary | ICD-10-CM | POA: Diagnosis not present

## 2019-04-14 DIAGNOSIS — I10 Essential (primary) hypertension: Secondary | ICD-10-CM | POA: Diagnosis not present

## 2019-04-14 DIAGNOSIS — J90 Pleural effusion, not elsewhere classified: Secondary | ICD-10-CM | POA: Diagnosis not present

## 2019-04-14 DIAGNOSIS — N400 Enlarged prostate without lower urinary tract symptoms: Secondary | ICD-10-CM | POA: Diagnosis not present

## 2019-04-14 DIAGNOSIS — N4289 Other specified disorders of prostate: Secondary | ICD-10-CM | POA: Diagnosis not present

## 2019-04-14 DIAGNOSIS — E039 Hypothyroidism, unspecified: Secondary | ICD-10-CM | POA: Diagnosis not present

## 2019-04-14 DIAGNOSIS — M5416 Radiculopathy, lumbar region: Secondary | ICD-10-CM | POA: Diagnosis not present

## 2019-04-14 DIAGNOSIS — R279 Unspecified lack of coordination: Secondary | ICD-10-CM | POA: Diagnosis not present

## 2019-04-14 DIAGNOSIS — E0789 Other specified disorders of thyroid: Secondary | ICD-10-CM | POA: Diagnosis not present

## 2019-04-14 DIAGNOSIS — M6281 Muscle weakness (generalized): Secondary | ICD-10-CM | POA: Diagnosis not present

## 2019-04-14 DIAGNOSIS — R609 Edema, unspecified: Secondary | ICD-10-CM | POA: Diagnosis not present

## 2019-04-14 DIAGNOSIS — Z743 Need for continuous supervision: Secondary | ICD-10-CM | POA: Diagnosis not present

## 2019-04-14 DIAGNOSIS — Z8616 Personal history of COVID-19: Secondary | ICD-10-CM | POA: Diagnosis not present

## 2019-04-14 DIAGNOSIS — Z978 Presence of other specified devices: Secondary | ICD-10-CM | POA: Diagnosis not present

## 2019-04-14 DIAGNOSIS — R531 Weakness: Secondary | ICD-10-CM | POA: Diagnosis not present

## 2019-04-14 DIAGNOSIS — I2510 Atherosclerotic heart disease of native coronary artery without angina pectoris: Secondary | ICD-10-CM | POA: Diagnosis not present

## 2019-04-14 DIAGNOSIS — E782 Mixed hyperlipidemia: Secondary | ICD-10-CM | POA: Diagnosis not present

## 2019-04-14 DIAGNOSIS — J189 Pneumonia, unspecified organism: Secondary | ICD-10-CM | POA: Diagnosis not present

## 2019-04-14 DIAGNOSIS — E785 Hyperlipidemia, unspecified: Secondary | ICD-10-CM | POA: Diagnosis not present

## 2019-04-14 DIAGNOSIS — K5900 Constipation, unspecified: Secondary | ICD-10-CM | POA: Diagnosis not present

## 2019-04-14 DIAGNOSIS — R52 Pain, unspecified: Secondary | ICD-10-CM | POA: Diagnosis not present

## 2019-04-14 DIAGNOSIS — M5417 Radiculopathy, lumbosacral region: Secondary | ICD-10-CM | POA: Diagnosis not present

## 2019-04-14 DIAGNOSIS — N390 Urinary tract infection, site not specified: Secondary | ICD-10-CM | POA: Diagnosis not present

## 2019-04-14 DIAGNOSIS — E46 Unspecified protein-calorie malnutrition: Secondary | ICD-10-CM | POA: Diagnosis not present

## 2019-04-14 DIAGNOSIS — E43 Unspecified severe protein-calorie malnutrition: Secondary | ICD-10-CM | POA: Diagnosis not present

## 2019-04-14 DIAGNOSIS — R338 Other retention of urine: Secondary | ICD-10-CM | POA: Diagnosis not present

## 2019-04-14 DIAGNOSIS — Z95828 Presence of other vascular implants and grafts: Secondary | ICD-10-CM | POA: Diagnosis not present

## 2019-04-14 DIAGNOSIS — Z9861 Coronary angioplasty status: Secondary | ICD-10-CM | POA: Diagnosis not present

## 2019-04-14 DIAGNOSIS — Z466 Encounter for fitting and adjustment of urinary device: Secondary | ICD-10-CM | POA: Diagnosis not present

## 2019-04-14 LAB — URINE CULTURE: Culture: >=100000 — AB

## 2019-04-14 LAB — BASIC METABOLIC PANEL
Anion gap: 9 (ref 5–15)
BUN: 17 mg/dL (ref 8–23)
CO2: 24 mmol/L (ref 22–32)
Calcium: 8 mg/dL — ABNORMAL LOW (ref 8.9–10.3)
Chloride: 105 mmol/L (ref 98–111)
Creatinine, Ser: 0.96 mg/dL (ref 0.61–1.24)
GFR calc Af Amer: >60 mL/min (ref >60)
GFR calc non Af Amer: >60 mL/min (ref >60)
Glucose, Bld: 105 mg/dL — ABNORMAL HIGH (ref 70–99)
Potassium: 3.6 mmol/L (ref 3.5–5.1)
Sodium: 138 mmol/L (ref 135–145)

## 2019-04-14 LAB — SARS CORONAVIRUS 2 (TAT 6-24 HRS): SARS Coronavirus 2: NEGATIVE

## 2019-04-14 MED ORDER — AMPICILLIN 500 MG PO CAPS: 500.0000 mg | ORAL_CAPSULE | Freq: Four times a day (QID) | ORAL | 0 refills | Status: DC

## 2019-04-14 MED ORDER — TRAMADOL HCL 50 MG PO TABS: 50.0000 mg | ORAL_TABLET | Freq: Four times a day (QID) | ORAL | 0 refills | Status: DC | PRN

## 2019-04-14 MED ORDER — METOPROLOL TARTRATE 100 MG PO TABS: 50.0000 mg | ORAL_TABLET | Freq: Two times a day (BID) | ORAL | 2 refills | Status: DC

## 2019-04-14 NOTE — Discharge Summary (Signed)
Physician Discharge Summary  Derek Foster [id number] DOB: [birth year] DOA: 04/11/2019  PCP: Celene Squibb, MD  Admit date: 04/11/2019 Discharge date: 04/14/2019  Admitted From: SNF Disposition: SNF  Recommendations for Outpatient Follow-up:  1. Follow up with PCP in 1-2 weeks 2. Please obtain BMP/CBC in one week 3. Please have palliative care follow patient at the facility  Home Health: None Equipment/Devices none  Discharge Condition: Stable and improved CODE STATUS: DNR Diet recommendation: Cardiac  Brief/Interim Summary:84 y.o.malewith medical history significant ofCAD status post PCI, A. fib, hypertension, hyperlipidemia, hyperthyroidism, GERD,recentCOVID-19, SBO, hematuria. Patient presented secondary to back pain and leg pain and found to have concern for pneumonia and CAUTI. Back pain appears to be secondary to degenerative disc disease with mild stenosis likely contributing to radiculopathy symptoms. Started on Vancomycin and Cefepime   Discharge Diagnoses:  Principal Problem:   UTI (urinary tract infection) due to urinary indwelling catheter ([place]) Active Problems:   HCAP (healthcare-associated pneumonia)   Pleural effusion   Radiculopathy   Atrial fibrillation ([place])   CAUTI-MRSA negative-he was treated with vancomycin and Zosyn.  His urine culture grew Proteus sensitive to ampicillin.  He will be discharged on ampicillin for 7 days.Foley recently exchanged 2 weeks ago.Catheter present on admission.  Possible HCAP-patient denies any dyspnea or cough.  Chest x-ray left lung base atelectasis versus infiltrate with small left effusion.  Blood cultures negative.   Pleural effusion/ Peripheral edema Minimal if any effusion. Given lasix.  Lumbosacral radiculopathy MRI significant for multilevel disc/facet degeneration causing mild stenosis. Patient with difficulty ambulating. Normally uses a walker. Physical therapy consulted and is recommending SNF. No red  flags Follow-up with neurosurgery as an outpatient.  Atrial fibrillation Rate controlled -Continue amiodarone, Cardizem, metoprolol and Eliquis  COVID-08 January 2019  BPH with indwelling Foley catheter-patient has follow[facility] appointment [facility] next month.  Pressure Injury 03/12/19 Buttocks Left Stage 2 -  Partial thickness loss of dermis presenting as a shallow open injury with a red, pink wound bed without slough. (Active)  03/12/19 0924  Location: Buttocks  Location Orientation: Left  Staging: Stage 2 -  Partial thickness loss of dermis presenting as a shallow open injury with a red, pink wound bed without slough.  Wound Description (Comments):   Present on Admission:       Nutrition Problem: Increased nutrient needs Etiology: acute illness(UTI due to urinary indwelling catheter and pneumonia)    Signs/Symptoms: estimated needs     Interventions: MVI, Prostat, Boost Breeze  Estimated body mass index is 38.3 kg/m as calculated from the following:   Height as of this encounter: 6' (1.829 m).   Weight as of this encounter: 128.1 kg.  Discharge Instructions  Discharge Instructions    Call MD for:  difficulty breathing, headache or visual disturbances   Complete by: As directed    Call MD for:  persistant nausea and vomiting   Complete by: As directed    Call MD for:  severe uncontrolled pain   Complete by: As directed    Diet - low sodium heart healthy   Complete by: As directed    Increase activity slowly   Complete by: As directed      Allergies as of 04/14/2019      Reactions   Indomethacin Anaphylaxis   But tolerates ibuprofen, aleve   Iodine-131 Anaphylaxis      Medication List    STOP taking these medications   HYDROcodone-homatropine 5-1.5 MG/5ML syrup Commonly known as: HYCODAN  pantoprazole 40 MG tablet Commonly known as: PROTONIX     TAKE these medications   amiodarone 200 MG tablet Commonly known as:  PACERONE Take 200 mg by mouth daily.   ampicillin 500 MG capsule Commonly known as: PRINCIPEN Take 1 capsule (500 mg total) by mouth 4 (four) times daily.   apixaban 5 MG Tabs tablet Commonly known as: ELIQUIS Take 1 tablet (5 mg total) by mouth 2 (two) times daily.   benzonatate 200 MG capsule Commonly known as: TESSALON Take 400 mg by mouth every 8 (eight) hours as needed for cough. What changed: Another medication with the same name was removed. Continue taking this medication, and follow the directions you see here.   bisacodyl 10 MG suppository Commonly known as: DULCOLAX Place 1 suppository (10 mg total) rectally daily as needed for severe constipation.   Coenzyme Q10 200 MG capsule Take 200 mg by mouth daily.   diltiazem 360 MG 24 hr capsule Commonly known as: CARDIZEM CD Take 1 capsule (360 mg total) by mouth daily.   feeding supplement (PRO-STAT SUGAR FREE 64) Liqd Take 30 mLs by mouth 3 (three) times daily with meals.   finasteride 5 MG tablet Commonly known as: PROSCAR Take 1 tablet (5 mg total) by mouth daily.   fish oil-omega-3 fatty acids 1000 MG capsule Take 2 g by mouth daily.   furosemide 20 MG tablet Commonly known as: LASIX Take 20 mg by mouth daily.   lactose free nutrition Liqd Take 237 mLs by mouth daily.   methimazole 10 MG tablet Commonly known as: TAPAZOLE Take 20 mg by mouth 2 (two) times daily.   methocarbamol 750 MG tablet Commonly known as: ROBAXIN Take 750 mg by mouth every 8 (eight) hours as needed for muscle spasms.   metoprolol tartrate 100 MG tablet Commonly known as: LOPRESSOR Take 0.5 tablets (50 mg total) by mouth 2 (two) times daily. What changed: how much to take   multivitamin tablet Take 1 tablet by mouth daily.   nitroGLYCERIN 0.4 MG SL tablet Commonly known as: NITROSTAT Place 1 tablet (0.4 mg total) under the tongue every 5 (five) minutes as needed.   polyethylene glycol 17 g packet Commonly known as: MIRALAX  / GLYCOLAX Take 17 g by mouth daily as needed for mild constipation.   potassium chloride 10 MEQ tablet Commonly known as: KLOR-CON Take 10 mEq by mouth every evening.   pravastatin 10 MG tablet Commonly known as: PRAVACHOL Take 1 tablet (10 mg total) by mouth daily at 6 PM.   senna-docusate 8.6-50 MG tablet Commonly known as: Senokot-S Take 1 tablet by mouth 2 (two) times daily. What changed:   when to take this  reasons to take this   tamsulosin 0.4 MG Caps capsule Commonly known as: FLOMAX Take 1 capsule (0.4 mg total) by mouth daily after breakfast.   traMADol 50 MG tablet Commonly known as: ULTRAM Take 1 tablet (50 mg total) by mouth every 6 (six) hours as needed for moderate pain.      Follow-up Information    Celene Squibb, MD Follow up.   Specialty: Internal Medicine Contact information: [facility] [address] [phone]        Skeet Latch, MD .   Specialty: Cardiology Contact information: [address] [place] [address] [phone]          Allergies  Allergen Reactions  . Indomethacin Anaphylaxis    But tolerates ibuprofen, aleve  . Iodine-131  Anaphylaxis    Consultations:none   Procedures/Studies: MR Lumbar Spine W Wo Contrast  Result Date: 04/12/2019 CLINICAL DATA:  Low back pain left leg pain EXAM: MRI LUMBAR SPINE WITHOUT AND WITH CONTRAST TECHNIQUE: Multiplanar and multiecho pulse sequences of the lumbar spine were obtained without and with intravenous contrast. CONTRAST:  75mL GADAVIST GADOBUTROL 1 MMOL/ML IV SOLN COMPARISON:  None. FINDINGS: Segmentation:  Normal.  Lowest disc space L5-S1 Alignment:  Normal Vertebrae: Negative for fracture or mass. Scattered hemangiomata L3, L4, L5. Conus medullaris and cauda equina: Conus extends to the L1-2 level. Conus and cauda equina appear normal. Paraspinal and other soft tissues: Negative for paraspinous mass or adenopathy. Negative for soft tissue  edema or fluid collection. Disc levels: L1-2: Mild degenerative change without disc protrusion or stenosis L2-3: Disc degeneration with disc bulging and diffuse endplate spurring. Shallow broad-based central disc protrusion and mild facet degeneration. Mild subarticular stenosis bilaterally. Mild narrowing of the spinal canal without significant stenosis. L3-4: Disc degeneration with disc bulging and endplate spurring left greater than right. Moderate facet hypertrophy. Mild subarticular stenosis on the left. Mild narrowing of the canal without significant stenosis L4-5: Moderate disc degeneration with disc space narrowing and endplate spurring right greater than left. Bilateral facet hypertrophy. Moderate subarticular stenosis on the right due to spurring L5-S1: Negative IMPRESSION: 1. No acute abnormality 2. Multilevel disc and facet degeneration causing mild stenosis as described above. Electronically Signed   By: Franchot Gallo M.D.   On: 04/12/2019 08:20   DG Chest Port 1 View  Result Date: 04/11/2019 CLINICAL DATA:  84 year old male with shortness of breath and cough. EXAM: PORTABLE CHEST 1 VIEW COMPARISON:  Chest radiograph dated 02/15/2019. FINDINGS: Faint left lung base hazy density may represent atelectasis although a small left pleural effusion is not excluded. Clinical correlation is recommended. The right lung is clear. No pneumothorax. There is stable cardiomegaly. Coronary vascular calcification or stent. Atherosclerotic calcification of the aorta. No acute osseous pathology. Degenerative changes of the spine. IMPRESSION: 1. Left lung base atelectasis versus infiltrate. A small left pleural effusion may be present. 2. Stable cardiomegaly. Electronically Signed   By: Anner Crete M.D.   On: 04/11/2019 17:41    (Echo, Carotid, EGD, Colonoscopy, ERCP)    Subjective: Patient resting in bed still has a lot of lower back pain and right knee pain  Discharge Exam: Vitals:   04/13/19 2011  04/14/19 0445  BP: (!) 147/72 (!) 154/72  Pulse: (!) 106 (!) 58  Resp: 16 15  Temp: 98.6 F (37 C) 98.7 F (37.1 C)  SpO2: 97% 98%   Vitals:   04/13/19 0501 04/13/19 1546 04/13/19 2011 04/14/19 0445  BP: (!) 142/67 (!) 131/47 (!) 147/72 (!) 154/72  Pulse: (!) 108 (!) 47 (!) 106 (!) 58  Resp: 20 18 16 15   Temp: 98.5 F (36.9 C) 98.7 F (37.1 C) 98.6 F (37 C) 98.7 F (37.1 C)  TempSrc:  Oral Oral Oral  SpO2: 94% 100% 97% 98%  Weight:    128.1 kg  Height:        General: Pt is alert, awake, not in acute distress Cardiovascular: RRR, S1/S2 +, no rubs, no gallops Respiratory: CTA bilaterally, no wheezing, no rhonchi Abdominal: Soft, NT, ND, bowel sounds + Extremities: no edema, no cyanosis moves all extremities decreased range of motion to the right lower extremity secondary to pain    The results of significant diagnostics from this hospitalization (including imaging, microbiology, ancillary and laboratory) are listed  below for reference.     Microbiology: Recent Results (from the past 240 hour(s))  Culture, Urine     Status: Abnormal   Collection Time: 04/11/19  9:15 PM   Specimen: Urine, Clean Catch  Result Value Ref Range Status   Specimen Description   Final    URINE, CLEAN CATCH Performed at [facility], [place] [address] [place], [place] [address]    Special Requests   Final    NONE Performed at [facility], [place] [address] [place], [place] [address]    Culture >=100,000 COLONIES/mL PROTEUS MIRABILIS (A)  Final   Report Status 04/14/2019 FINAL  Final   Organism ID, Bacteria PROTEUS MIRABILIS (A)  Final      Susceptibility   Proteus mirabilis - MIC*    AMPICILLIN <=2 SENSITIVE Sensitive     CEFAZOLIN <=4 SENSITIVE Sensitive     CEFTRIAXONE <=0.25 SENSITIVE Sensitive     CIPROFLOXACIN 0.5 SENSITIVE Sensitive     GENTAMICIN <=1 SENSITIVE Sensitive     IMIPENEM 4 SENSITIVE Sensitive     NITROFURANTOIN 256 RESISTANT  Resistant     TRIMETH/SULFA <=20 SENSITIVE Sensitive     AMPICILLIN/SULBACTAM <=2 SENSITIVE Sensitive     PIP/TAZO <=4 SENSITIVE Sensitive     * >=100,000 COLONIES/mL PROTEUS MIRABILIS  Culture, blood (routine x 2)     Status: None (Preliminary result)   Collection Time: 04/11/19 10:22 PM   Specimen: BLOOD  Result Value Ref Range Status   Specimen Description   Final    BLOOD LEFT ANTECUBITAL Performed at [place] [address], [place], [place] [address]    Special Requests   Final    BOTTLES DRAWN AEROBIC AND ANAEROBIC Blood Culture adequate volume Performed at [place] [address] [place], [place] [address]    Culture   Final    NO GROWTH 2 DAYS Performed at [place] [address] [place], [place] [address]    Report Status PENDING  Incomplete  Culture, blood (routine x 2)     Status: None (Preliminary result)   Collection Time: 04/11/19 10:22 PM   Specimen: BLOOD RIGHT HAND  Result Value Ref Range Status   Specimen Description BLOOD RIGHT HAND  Final   Special Requests   Final    BOTTLES DRAWN AEROBIC ONLY Blood Culture adequate volume Performed at [place] [address] [place], [place] [address]    Culture   Final    NO GROWTH 2 DAYS Performed at [place] [address] [place], [place] [address]    Report Status PENDING  Incomplete  MRSA PCR Screening     Status: None   Collection Time: 04/12/19  1:56 PM   Specimen: Nasopharyngeal  Result Value Ref Range Status   MRSA by PCR NEGATIVE NEGATIVE Final    Comment:        The GeneXpert MRSA Assay (FDA approved for NASAL specimens only), is one component of a comprehensive MRSA colonization surveillance program. It is not intended to diagnose MRSA infection nor to guide or monitor treatment for MRSA infections. Performed at [facility], [place] [address] [place], [place] [address]   SARS  CORONAVIRUS 2 (TAT 6-24 HRS) Nasopharyngeal Nasopharyngeal Swab     Status: None   Collection Time: 04/13/19  6:49 PM   Specimen: Nasopharyngeal Swab  Result Value Ref Range Status   SARS Coronavirus 2 NEGATIVE NEGATIVE Final  Comment: (NOTE) SARS-CoV-2 target nucleic acids are NOT DETECTED. The SARS-CoV-2 RNA is generally detectable in upper and lower respiratory specimens during the acute phase of infection. Negative results do not preclude SARS-CoV-2 infection, do not rule out co-infections with other pathogens, and should not be used as the sole basis for treatment or other patient management decisions. Negative results must be combined with clinical observations, patient history, and epidemiological information. The expected result is Negative. Fact Sheet for Patients: SugarRoll.be Fact Sheet for Healthcare Providers: https://www.woods-Kylena Mole.com/ This test is not yet approved or cleared by the [place] FDA and  has been authorized for detection and/or diagnosis of SARS-CoV-2 by FDA under an Emergency Use Authorization (EUA). This EUA will remain  in effect (meaning this test can be used) for the duration of the COVID-19 declaration under Section 56 4(b)(1) of the Act, 21 U.S.C. section 360bbb-3(b)(1), unless the authorization is terminated or revoked sooner. Performed at [facility], [place] [address], [place], [place] [address]      Labs: BNP (last 3 results) Recent Labs    03/02/19 0439 03/03/19 0737 04/11/19 1651  BNP 317.6* 345.4* 123456*   Basic Metabolic Panel: Recent Labs  Lab 04/11/19 1651 04/12/19 0516 04/13/19 0539 04/14/19 0527  NA 139 138 140 138  K 4.0 3.7 3.6 3.6  CL 105 104 106 105  CO2 26 27 26 24   GLUCOSE 104* 109* 121* 105*  BUN 15 15 18 17   CREATININE 1.27* 1.34* 1.23 0.96  CALCIUM 8.0* 7.9* 8.2* 8.0*  MG  --   --  2.0  --    Liver Function Tests: Recent Labs  Lab 04/11/19 1651   AST 14*  ALT 16  ALKPHOS 86  BILITOT 0.4  PROT 5.8*  ALBUMIN 2.8*   No results for input(s): LIPASE, AMYLASE in the last 168 hours. No results for input(s): AMMONIA in the last 168 hours. CBC: Recent Labs  Lab 04/11/19 1651 04/13/19 0539  WBC 8.1 6.8  NEUTROABS 5.2  --   HGB 8.8* 8.9*  HCT 28.4* 28.9*  MCV 94.4 94.4  PLT 230 221   Cardiac Enzymes: No results for input(s): CKTOTAL, CKMB, CKMBINDEX, TROPONINI in the last 168 hours. BNP: Invalid input(s): POCBNP CBG: No results for input(s): GLUCAP in the last 168 hours. D-Dimer No results for input(s): DDIMER in the last 72 hours. Hgb A1c No results for input(s): HGBA1C in the last 72 hours. Lipid Profile No results for input(s): CHOL, HDL, LDLCALC, TRIG, CHOLHDL, LDLDIRECT in the last 72 hours. Thyroid function studies No results for input(s): TSH, T4TOTAL, T3FREE, THYROIDAB in the last 72 hours.  Invalid input(s): FREET3 Anemia work up No results for input(s): VITAMINB12, FOLATE, FERRITIN, TIBC, IRON, RETICCTPCT in the last 72 hours. Urinalysis    Component Value Date/Time   COLORURINE YELLOW 04/11/2019 1756   APPEARANCEUR CLOUDY (A) 04/11/2019 1756   LABSPEC 1.016 04/11/2019 1756   PHURINE 8.0 04/11/2019 1756   GLUCOSEU NEGATIVE 04/11/2019 1756   HGBUR MODERATE (A) 04/11/2019 1756   BILIRUBINUR NEGATIVE 04/11/2019 1756   KETONESUR NEGATIVE 04/11/2019 1756   PROTEINUR 30 (A) 04/11/2019 1756   NITRITE NEGATIVE 04/11/2019 1756   LEUKOCYTESUR LARGE (A) 04/11/2019 1756   Sepsis Labs Invalid input(s): PROCALCITONIN,  WBC,  LACTICIDVEN Microbiology Recent Results (from the past 240 hour(s))  Culture, Urine     Status: Abnormal   Collection Time: 04/11/19  9:15 PM   Specimen: Urine, Clean Catch  Result Value Ref Range Status   Specimen  Description   Final    URINE, CLEAN CATCH Performed at [facility], [place] [address] [place], [place] [address]    Special Requests   Final     NONE Performed at [facility], [place] [address], [place], [place] [address]    Culture >=100,000 COLONIES/mL PROTEUS MIRABILIS (A)  Final   Report Status 04/14/2019 FINAL  Final   Organism ID, Bacteria PROTEUS MIRABILIS (A)  Final      Susceptibility   Proteus mirabilis - MIC*    AMPICILLIN <=2 SENSITIVE Sensitive     CEFAZOLIN <=4 SENSITIVE Sensitive     CEFTRIAXONE <=0.25 SENSITIVE Sensitive     CIPROFLOXACIN 0.5 SENSITIVE Sensitive     GENTAMICIN <=1 SENSITIVE Sensitive     IMIPENEM 4 SENSITIVE Sensitive     NITROFURANTOIN 256 RESISTANT Resistant     TRIMETH/SULFA <=20 SENSITIVE Sensitive     AMPICILLIN/SULBACTAM <=2 SENSITIVE Sensitive     PIP/TAZO <=4 SENSITIVE Sensitive     * >=100,000 COLONIES/mL PROTEUS MIRABILIS  Culture, blood (routine x 2)     Status: None (Preliminary result)   Collection Time: 04/11/19 10:22 PM   Specimen: BLOOD  Result Value Ref Range Status   Specimen Description   Final    BLOOD LEFT ANTECUBITAL Performed at [place] [address], [place], [place] [address]    Special Requests   Final    BOTTLES DRAWN AEROBIC AND ANAEROBIC Blood Culture adequate volume Performed at [place] [address], [place], [place] [address]    Culture   Final    NO GROWTH 2 DAYS Performed at [place] [address] [place], [place] [address]    Report Status PENDING  Incomplete  Culture, blood (routine x 2)     Status: None (Preliminary result)   Collection Time: 04/11/19 10:22 PM   Specimen: BLOOD RIGHT HAND  Result Value Ref Range Status   Specimen Description BLOOD RIGHT HAND  Final   Special Requests   Final    BOTTLES DRAWN AEROBIC ONLY Blood Culture adequate volume Performed at [place] [address] [place], [place] [address]    Culture   Final    NO GROWTH 2 DAYS Performed at [place] [address], [place], [place] [address]    Report  Status PENDING  Incomplete  MRSA PCR Screening     Status: None   Collection Time: 04/12/19  1:56 PM   Specimen: Nasopharyngeal  Result Value Ref Range Status   MRSA by PCR NEGATIVE NEGATIVE Final    Comment:        The GeneXpert MRSA Assay (FDA approved for NASAL specimens only), is one component of a comprehensive MRSA colonization surveillance program. It is not intended to diagnose MRSA infection nor to guide or monitor treatment for MRSA infections. Performed at [facility], [place] [address], [place], [place] [address]   SARS CORONAVIRUS 2 (TAT 6-24 HRS) Nasopharyngeal Nasopharyngeal Swab     Status: None   Collection Time: 04/13/19  6:49 PM   Specimen: Nasopharyngeal Swab  Result Value Ref Range Status   SARS Coronavirus 2 NEGATIVE NEGATIVE Final    Comment: (NOTE) SARS-CoV-2 target nucleic acids are NOT DETECTED. The SARS-CoV-2 RNA is generally detectable in upper and lower respiratory specimens during the acute phase of infection. Negative results do not preclude SARS-CoV-2 infection, do not rule out co-infections with other pathogens, and should  not be used as the sole basis for treatment or other patient management decisions. Negative results must be combined with clinical observations, patient history, and epidemiological information. The expected result is Negative. Fact Sheet for Patients: SugarRoll.be Fact Sheet for Healthcare Providers: https://www.woods-Mable Dara.com/ This test is not yet approved or cleared by the [place] FDA and  has been authorized for detection and/or diagnosis of SARS-CoV-2 by FDA under an Emergency Use Authorization (EUA). This EUA will remain  in effect (meaning this test can be used) for the duration of the COVID-19 declaration under Section 56 4(b)(1) of the Act, 21 U.S.C. section 360bbb-3(b)(1), unless the authorization is terminated or revoked sooner. Performed at  [facility], [place] [address] [place], [place] [address]      Time coordinating discharge:  39 minutes  SIGNED:   Georgette Shell, MD  [facility] 04/14/2019, 11:33 AM Pager   If 7PM-7AM, please contact night-coverage [email or web address] Password [email or web address]

## 2019-04-14 NOTE — Progress Notes (Signed)
Physical Therapy Treatment Patient Details Name: Derek Foster MRN: [id number] DOB: 14-May-1935 Today's Date: 04/14/2019    History of Present Illness 84 year old presented with abdominal pain, nausea, vomiting with generalized weakness since his recent Covid diagnosis on 02-17-20 (patient's wife passed away from COVID-26 2 weeks ago).Pt with Afib, small bowel obstruction, and bladder outlet obstruction. PMHx: CAD, HTN, HLD, GERD, hypothyroidism.    PT Comments    Min assist for sit to stand from elevated bed, min assist to ambulate 3' with RW, distance limited by fatigue and BLE pain. HR 70-80s at rest (noted to be in a fib), HR 110-120 with activity. Instructed pt in BLE/UE strengthening exercises. ST-SNF recommended as pt lives alone and is significantly limited with mobility.    Follow Up Recommendations  SNF     Equipment Recommendations  None recommended by PT    Recommendations for Other Services       Precautions / Restrictions Precautions Precautions: Fall Restrictions Weight Bearing Restrictions: No    Mobility  Bed Mobility Overal bed mobility: Modified Independent Bed Mobility: Rolling;Sidelying to Sit Rolling: Supervision Sidelying to sit: Supervision;HOB elevated       General bed mobility comments: Assist for trunk and LEs. Utilized bedpad for scooting, positioning. Increased time. Cues for technique. Pt relied on bedrail  Transfers Overall transfer level: Needs assistance Equipment used: Rolling walker (2 wheeled) Transfers: Sit to/from Stand Sit to Stand: Min assist;From elevated surface Stand pivot transfers: Min assist;From elevated surface       General transfer comment: Assist to rise, stabilize, control descent. Increased time. VCs safety, technique, hand placement. Stand pivot, bed to recliner, using RW. Bil LE weakness noted. Pt fatigues easily. HR 115 with activity, HR 70-80s at rest (in a fib).  Ambulation/Gait Ambulation/Gait assistance:  Min assist Gait Distance (Feet): 3 Feet Assistive device: Rolling walker (2 wheeled) Gait Pattern/deviations: Step-to pattern;Decreased step length - right;Decreased step length - left;Decreased stride length;Shuffle Gait velocity: decr   General Gait Details: distance limited by BLE fatigue, pt took several pivotal steps from bed to recliner   Stairs             Wheelchair Mobility    Modified Rankin (Stroke Patients Only)       Balance Overall balance assessment: Needs assistance Sitting-balance support: Feet supported;No upper extremity supported Sitting balance-Leahy Scale: Fair     Standing balance support: Bilateral upper extremity supported Standing balance-Leahy Scale: Poor                              Cognition Arousal/Alertness: Awake/alert Behavior During Therapy: WFL for tasks assessed/performed Overall Cognitive Status: Within Functional Limits for tasks assessed                                        Exercises General Exercises - Lower Extremity Ankle Circles/Pumps: AROM;Both;10 reps;Supine Short Arc Quad: AROM;Both;10 reps;Supine Heel Slides: AAROM;Both;10 reps;Supine Shoulder Exercises Shoulder Flexion: AROM;Both;10 reps;Seated    General Comments        Pertinent Vitals/Pain Pain Score: 5  Pain Location: B hips and calves Pain Descriptors / Indicators: Aching Pain Intervention(s): Limited activity within patient's tolerance;Monitored during session;Repositioned    Home Living                      Prior Function  PT Goals (current goals can now be found in the care plan section) Acute Rehab PT Goals Patient Stated Goal: to go to [facility] PT Goal Formulation: With patient Time For Goal Achievement: 04/26/19 Potential to Achieve Goals: Fair Progress towards PT goals: Progressing toward goals    Frequency    Min 3X/week      PT Plan Current plan remains appropriate     Co-evaluation              AM-PAC PT "6 Clicks" Mobility   Outcome Measure  Help needed turning from your back to your side while in a flat bed without using bedrails?: A Little Help needed moving from lying on your back to sitting on the side of a flat bed without using bedrails?: A Little Help needed moving to and from a bed to a chair (including a wheelchair)?: A Little Help needed standing up from a chair using your arms (e.g., wheelchair or bedside chair)?: A Lot Help needed to walk in hospital room?: A Lot Help needed climbing 3-5 steps with a railing? : Total 6 Click Score: 14    End of Session Equipment Utilized During Treatment: Gait belt Activity Tolerance: Patient limited by fatigue;Patient limited by pain Patient left: in chair;with call bell/phone within reach;with chair alarm set Nurse Communication: Mobility status PT Visit Diagnosis: Muscle weakness (generalized) (M62.81);Difficulty in walking, not elsewhere classified (R26.2);Pain Pain - part of body: Leg(back)     Time: [id number] PT Time Calculation (min) (ACUTE ONLY): 17 min  Charges:  $Therapeutic Activity: 8-22 mins                     Blondell Reveal Kistler PT 04/14/2019  Acute Rehabilitation Services Pager [phone] Office [phone]

## 2019-04-14 NOTE — TOC Transition Note (Signed)
Transition of Care [facility]) - CM/SW Discharge Note   Patient Details  Name: Derek Foster MRN: [id number] Date of Birth: [birth year]  [facility] Care [facility] CM/SW Contact:  Trish Mage, LCSW Phone Number: 04/14/2019, 11:52 AM   Clinical Narrative:   Patient to transfer to [place] today.  PTAR arranged.  Nursing, please call report to [phone], bed 159. TOC sign off.    Final next level of care: Skilled Nursing Facility Barriers to Discharge: Barriers Resolved   Patient Goals and CMS Choice Patient states their goals for this hospitalization and ongoing recovery are:: SNF at [facility] [place]      Discharge Placement                       Discharge Plan and Services                                     Social Determinants of Health (SDOH) Interventions     Readmission Risk Interventions Readmission Risk Prevention Plan 03/11/2019  Transportation Screening Complete  PCP or Specialist Appt within 3-5 Days Not Complete  Not Complete comments plan for SNF  HRI or [place] Not Complete  HRI or Home Care Consult comments plan for SNF  Social Work Consult for [place] Planning/Counseling Complete  Palliative Care Screening Complete  Medication Review (RN Care Manager) Referral to Pharmacy  Some recent data might be hidden

## 2019-04-14 NOTE — Plan of Care (Signed)
  Problem: Education: Goal: Ability to demonstrate management of disease process will improve Outcome: Adequate for Discharge Goal: Ability to verbalize understanding of medication therapies will improve Outcome: Adequate for Discharge Goal: Individualized Educational Video(s) Outcome: Adequate for Discharge   Problem: Activity: Goal: Capacity to carry out activities will improve Outcome: Adequate for Discharge   Problem: Cardiac: Goal: Ability to achieve and maintain adequate cardiopulmonary perfusion will improve Outcome: Adequate for Discharge   Problem: Education: Goal: Knowledge of General Education information will improve Description: Including pain rating scale, medication(s)/side effects and non-pharmacologic comfort measures Outcome: Adequate for Discharge   Problem: Health Behavior/Discharge Planning: Goal: Ability to manage health-related needs will improve Outcome: Adequate for Discharge   Problem: Clinical Measurements: Goal: Ability to maintain clinical measurements within normal limits will improve Outcome: Adequate for Discharge Goal: Will remain free from infection Outcome: Adequate for Discharge Goal: Diagnostic test results will improve Outcome: Adequate for Discharge Goal: Respiratory complications will improve Outcome: Adequate for Discharge Goal: Cardiovascular complication will be avoided Outcome: Adequate for Discharge   Problem: Activity: Goal: Risk for activity intolerance will decrease Outcome: Adequate for Discharge   Problem: Nutrition: Goal: Adequate nutrition will be maintained Outcome: Adequate for Discharge   Problem: Coping: Goal: Level of anxiety will decrease Outcome: Adequate for Discharge   Problem: Elimination: Goal: Will not experience complications related to bowel motility Outcome: Adequate for Discharge Goal: Will not experience complications related to urinary retention Outcome: Adequate for Discharge   Problem: Pain  Managment: Goal: General experience of comfort will improve Outcome: Adequate for Discharge   Problem: Safety: Goal: Ability to remain free from injury will improve Outcome: Adequate for Discharge   Problem: Skin Integrity: Goal: Risk for impaired skin integrity will decrease Outcome: Adequate for Discharge   Problem: Activity: Goal: Ability to tolerate increased activity will improve Outcome: Adequate for Discharge   Problem: Clinical Measurements: Goal: Ability to maintain a body temperature in the normal range will improve Outcome: Adequate for Discharge   Problem: Respiratory: Goal: Ability to maintain adequate ventilation will improve Outcome: Adequate for Discharge Goal: Ability to maintain a clear airway will improve Outcome: Adequate for Discharge

## 2019-04-14 NOTE — TOC Progression Note (Signed)
Transition of Care [facility]) - Progression Note    Patient Details  Name: AKIEM KOSKIE MRN: [id number] Date of Birth: Oct 09, 1935  Transition of Care [facility]) CM/SW [place], [place] Phone Number: 04/14/2019, 9:36 AM  Clinical Narrative:   Received call from HTA stating patient has been reviewed and denied for SNF.  Peer to peer with Dr Amalia Hailey is possible at [phone].  Alerted hospitalist.  In the meantime, he is approved for PTAR transportation.  [id number]. TOC will continue to follow during the course of hospitalization.     Expected Discharge Plan: Skilled Nursing Facility Barriers to Discharge: SNF Authorization Denied(Peer to peer)  Expected Discharge Plan and Services Expected Discharge Plan: [place] arrangements for the past 2 months: [place], Single Family Home                                       Social Determinants of Health (SDOH) Interventions    Readmission Risk Interventions Readmission Risk Prevention Plan 03/11/2019  Transportation Screening Complete  PCP or Specialist Appt within 3-5 Days Not Complete  Not Complete comments plan for SNF  HRI or [place] Not Complete  HRI or Home Care Consult comments plan for SNF  Social Work Consult for [place] Planning/Counseling Complete  Palliative Care Screening Complete  Medication Review (RN Care Manager) Referral to Pharmacy  Some recent data might be hidden

## 2019-04-14 NOTE — Progress Notes (Signed)
Report called to [facility] at this time and given to [employer]

## 2019-04-14 NOTE — Care Management Important Message (Signed)
Important Message  Patient Details IM Letter given to Roque Lias SW Case Manager to present to the Patient Name: [name] MRN: [id number] Date of Birth: 12-Jan-1936   Medicare Important Message Given:  Yes     Kerin Salen 04/14/2019, 11:44 AM

## 2019-04-15 ENCOUNTER — Non-Acute Institutional Stay (SKILLED_NURSING_FACILITY): Payer: PPO | Admitting: Adult Health

## 2019-04-15 ENCOUNTER — Other Ambulatory Visit: Payer: Self-pay | Admitting: Adult Health

## 2019-04-15 DIAGNOSIS — E0590 Thyrotoxicosis, unspecified without thyrotoxic crisis or storm: Secondary | ICD-10-CM

## 2019-04-15 DIAGNOSIS — K219 Gastro-esophageal reflux disease without esophagitis: Secondary | ICD-10-CM | POA: Diagnosis not present

## 2019-04-15 DIAGNOSIS — N390 Urinary tract infection, site not specified: Secondary | ICD-10-CM | POA: Diagnosis not present

## 2019-04-15 DIAGNOSIS — B964 Proteus (mirabilis) (morganii) as the cause of diseases classified elsewhere: Secondary | ICD-10-CM

## 2019-04-15 DIAGNOSIS — J90 Pleural effusion, not elsewhere classified: Secondary | ICD-10-CM | POA: Diagnosis not present

## 2019-04-15 DIAGNOSIS — M5416 Radiculopathy, lumbar region: Secondary | ICD-10-CM | POA: Diagnosis not present

## 2019-04-15 DIAGNOSIS — E43 Unspecified severe protein-calorie malnutrition: Secondary | ICD-10-CM

## 2019-04-15 DIAGNOSIS — N400 Enlarged prostate without lower urinary tract symptoms: Secondary | ICD-10-CM | POA: Diagnosis not present

## 2019-04-15 DIAGNOSIS — K56609 Unspecified intestinal obstruction, unspecified as to partial versus complete obstruction: Secondary | ICD-10-CM

## 2019-04-15 DIAGNOSIS — I2510 Atherosclerotic heart disease of native coronary artery without angina pectoris: Secondary | ICD-10-CM

## 2019-04-15 DIAGNOSIS — Z9861 Coronary angioplasty status: Secondary | ICD-10-CM

## 2019-04-15 DIAGNOSIS — I4891 Unspecified atrial fibrillation: Secondary | ICD-10-CM | POA: Diagnosis not present

## 2019-04-15 DIAGNOSIS — I10 Essential (primary) hypertension: Secondary | ICD-10-CM

## 2019-04-15 DIAGNOSIS — E782 Mixed hyperlipidemia: Secondary | ICD-10-CM | POA: Diagnosis not present

## 2019-04-15 MED ORDER — TRAMADOL HCL 50 MG PO TABS: 50.0000 mg | ORAL_TABLET | Freq: Four times a day (QID) | ORAL | 0 refills | Status: AC | PRN

## 2019-04-17 LAB — CULTURE, BLOOD (ROUTINE X 2)
Culture: NO GROWTH
Culture: NO GROWTH
Special Requests: ADEQUATE
Special Requests: ADEQUATE

## 2019-04-17 NOTE — Progress Notes (Signed)
Location:   [facility] Room Number: [id number] of Service:  SNF (31)   CODE STATUS: full code  Allergies  Allergen Reactions  . Indomethacin Anaphylaxis    But tolerates ibuprofen, aleve  . Iodine-131 Anaphylaxis    Chief Complaint  Patient presents with  . Hospitalization Follow-up    HPI:  He is a 84 year old man who has been hospitalized from 04-11-19 through 04-14-19. For a catheter associated UTI; pleural effusion. He has had some lumbar radiculopathy. He is here for short term rehab. He had not done well at home after his prior discharge to home. At this time his goal is to return back home. He denies any uncontrolled pain; he is tired; no changes in appetite. He will continue be followed for his chronic illnesses including afib; chf; hyperlipidemia.   Past Medical History:  Diagnosis Date  . Acute myocardial infarction, unspecified site, episode of care unspecified   . CAD in native artery    a. cath 01/27/2009 : s/p promus DES to LAD and medical managment of 70-80% mRCA  . Edema   . HTN (hypertension)   . Mixed hyperlipidemia   . Unspecified hypothyroidism     Past Surgical History:  Procedure Laterality Date  . CHOLECYSTECTOMY    . CORONARY STENT PLACEMENT    . CYSTOSCOPY/RETROGRADE/URETEROSCOPY Bilateral 03/09/2019   Procedure: CYSTOSCOPY, Clot Evacuation, Fulguration;  Surgeon: Festus Aloe, MD;  Location: [facility];  Service: Urology;  Laterality: Bilateral;  . HERNIA REPAIR      Social History   Socioeconomic History  . Marital status: Married    Spouse name: Not on file  . Number of children: Not on file  . Years of education: Not on file  . Highest education level: Not on file  Occupational History  . Occupation: Charity fundraiser  Tobacco Use  . Smoking status: Former Smoker    Quit date: 01/20/1957    Years since quitting: 62.2  . Smokeless tobacco: Never Used  Substance and Sexual Activity  . Alcohol use: No  . Drug use: Never  . Sexual  activity: Not on file  Other Topics Concern  . Not on file  Social History Narrative   Former smoker.   Social Determinants of Health   Financial Resource Strain:   . Difficulty of Paying Living Expenses:   Food Insecurity:   . Worried About Charity fundraiser in the Last Year:   . Arboriculturist in the Last Year:   Transportation Needs:   . Film/video editor (Medical):   Marland Kitchen Lack of Transportation (Non-Medical):   Physical Activity:   . Days of Exercise per Week:   . Minutes of Exercise per Session:   Stress:   . Feeling of Stress :   Social Connections:   . Frequency of Communication with Friends and Family:   . Frequency of Social Gatherings with Friends and Family:   . Attends Religious Services:   . Active Member of Clubs or Organizations:   . Attends Archivist Meetings:   Marland Kitchen Marital Status:   Intimate Partner Violence:   . Fear of Current or Ex-Partner:   . Emotionally Abused:   Marland Kitchen Physically Abused:   . Sexually Abused:    Family History  Problem Relation Age of Onset  . Heart attack Father   . Lung cancer Sister       VITAL SIGNS BP 112/70   Pulse 76   Temp (!) 97.1 F (  36.2 C)   Resp 20   Ht 6' (1.829 m)   Wt 283 lb 3.2 oz (128.5 kg)   BMI 38.41 kg/m   Outpatient Encounter Medications as of 04/15/2019  Medication Sig Note  . Amino Acids-Protein Hydrolys (FEEDING SUPPLEMENT, PRO-STAT SUGAR FREE 64,) LIQD Take 30 mLs by mouth 3 (three) times daily with meals. 04/11/2019: Hasn't started.  Marland Kitchen amiodarone (PACERONE) 200 MG tablet Take 200 mg by mouth daily.   Marland Kitchen ampicillin (PRINCIPEN) 500 MG capsule Take 1 capsule (500 mg total) by mouth 4 (four) times daily.   Marland Kitchen apixaban (ELIQUIS) 5 MG TABS tablet Take 1 tablet (5 mg total) by mouth 2 (two) times daily.   . benzonatate (TESSALON) 200 MG capsule Take 400 mg by mouth every 8 (eight) hours as needed for cough.    . bisacodyl (DULCOLAX) 10 MG suppository Place 1 suppository (10 mg total) rectally  daily as needed for severe constipation.   . Coenzyme Q10 200 MG capsule Take 200 mg by mouth daily.     Marland Kitchen diltiazem (CARDIZEM CD) 360 MG 24 hr capsule Take 1 capsule (360 mg total) by mouth daily.   . finasteride (PROSCAR) 5 MG tablet Take 1 tablet (5 mg total) by mouth daily.   . fish oil-omega-3 fatty acids 1000 MG capsule Take 2 g by mouth daily.   04/11/2019: Hasn't started.  . furosemide (LASIX) 20 MG tablet Take 20 mg by mouth daily.   Marland Kitchen lactose free nutrition (BOOST) LIQD Take 237 mLs by mouth daily.   . methimazole (TAPAZOLE) 10 MG tablet Take 20 mg by mouth 2 (two) times daily.    . methocarbamol (ROBAXIN) 750 MG tablet Take 750 mg by mouth every 8 (eight) hours as needed for muscle spasms.    . metoprolol tartrate (LOPRESSOR) 100 MG tablet Take 0.5 tablets (50 mg total) by mouth 2 (two) times daily.   . Multiple Vitamin (MULTIVITAMIN) tablet Take 1 tablet by mouth daily.     . nitroGLYCERIN (NITROSTAT) 0.4 MG SL tablet Place 1 tablet (0.4 mg total) under the tongue every 5 (five) minutes as needed.   . polyethylene glycol (MIRALAX / GLYCOLAX) 17 g packet Take 17 g by mouth daily as needed for mild constipation.   . potassium chloride (KLOR-CON) 10 MEQ tablet Take 10 mEq by mouth every evening.    . pravastatin (PRAVACHOL) 10 MG tablet Take 1 tablet (10 mg total) by mouth daily at 6 PM.   . senna-docusate (SENOKOT-S) 8.6-50 MG tablet Take 1 tablet by mouth 2 (two) times daily. (Patient taking differently: Take 1 tablet by mouth 2 (two) times daily as needed for moderate constipation. )   . tamsulosin (FLOMAX) 0.4 MG CAPS capsule Take 1 capsule (0.4 mg total) by mouth daily after breakfast.   . traMADol (ULTRAM) 50 MG tablet Take 1 tablet (50 mg total) by mouth every 6 (six) hours as needed for up to 4 days for moderate pain.    No facility-administered encounter medications on file as of 04/15/2019.     SIGNIFICANT DIAGNOSTIC EXAMS  PREVIOUS  02-13-19: 2-d echo:  1. Left  ventricular ejection fraction, by visual estimation, is 60 to  65%. The left ventricle has normal function. There is moderately increased  left ventricular hypertrophy.   02-27-19: renal ultrasound:  1. No evidence of hydronephrosis involving either kidney to suggest urinary tract obstruction. 2. Mild diffuse cortical thinning involving both kidneys. No focal abnormality involving either kidney and no visible shadowing calculi.  TODAY  04-11-19: chest x-ray: 1. Left lung base atelectasis versus infiltrate. A small left pleural effusion may be present. 2. Stable cardiomegaly.  04-12-19: lumbar spine MRI:  1. No acute abnormality 2. Multilevel disc and facet degeneration causing mild stenosis as described above    LABS REVIEWED PREVOIUS  02-11-19: wbc 12.9; hgb 13.8; hct 42.5; mcv 92.6 plt 192; glucose 131; bun 43; creat 1.71; k+ 4.7; na++ 145; ca 8.8; liver normal albumin 3.4 tsh 0.026 02-12-19: free t3: 3.4 free t4: 1.76 02-14-19: digoxin 0.9 02-23-19: tsh 0.056 free t3: 2.15 03-02-19: glucose 116; bun 22; creat 1.41; k+ 3.8; na++ 131; ca 7.9; liver normal albumin 1.8 d-dimer 2.3 03-12-19: wbc 7.3; hgb 7.9; hct 24.9; mcv 92.9 plt 218; glucose 100; bun 20; creat 1.70; k+ 3.7; na++ 138; ca 8.1   TODAY  04-11-19: wbc 8.1; hgb 8.8; hct 28.4; mcv 94.4 plt 230; glucose 104; bun 15; creat 1.27; k+ 4.0; na++ 139; ca 8.0; liver normal albumin 2.8 BNP 243.7; blood culture: no growth; urine culture: proteus mirabilis  04-13-19: wbc 6.8; hgb 8.9; hct 28.9; mcv 94.4 plt 221; glucose 121; bun 18; creat 1.23; k+ 3.6; na++ 140; ca 8.2; mag 2.0 04-14-19: glucose 105; bun 17; creat 0.96; k+ 3.6; na++ 138; ca 8.0    Review of Systems  Constitutional: Negative for malaise/fatigue.  Respiratory: Negative for cough and shortness of breath.   Cardiovascular: Negative for chest pain, palpitations and leg swelling.  Gastrointestinal: Negative for abdominal pain, constipation and heartburn.  Musculoskeletal: Negative  for back pain, joint pain and myalgias.  Skin: Negative.   Neurological: Negative for dizziness.  Psychiatric/Behavioral: The patient is not nervous/anxious.    Physical Exam Constitutional:      General: He is not in acute distress.    Appearance: He is well-developed. He is not diaphoretic.  Neck:     Thyroid: No thyromegaly.  Cardiovascular:     Rate and Rhythm: Normal rate. Rhythm irregular.     Pulses: Normal pulses.     Heart sounds: Normal heart sounds.  Pulmonary:     Effort: Pulmonary effort is normal. No respiratory distress.     Breath sounds: Normal breath sounds.  Abdominal:     General: Bowel sounds are normal. There is no distension.     Palpations: Abdomen is soft.     Tenderness: There is no abdominal tenderness.  Genitourinary:    Comments: foley Musculoskeletal:        General: Normal range of motion.     Cervical back: Neck supple.     Right lower leg: Edema present.     Left lower leg: Edema present.     Comments: Mild lower extremity edema   Lymphadenopathy:     Cervical: No cervical adenopathy.  Skin:    General: Skin is warm and dry.  Neurological:     Mental Status: He is alert. Mental status is at baseline.  Psychiatric:        Mood and Affect: Mood normal.      ASSESSMENT/ PLAN:   TODAY  1. Pleural effusion: is without change will change to  lasix 40 mg daily with k+ 10 meq daily   2. UTI is stable will complete ampicillin 500 mg four times daily   3. Atrial fibrillation with RVR: heart rate is stable. Will continue amiodarone 200 mg  daily  cardizem cd 360; lopressor 50 mg twice daily for rate control  eliquis 5 mg twice daily   4. Coronary  artery disease of native artery of native heart without angina: is stable will continue lopressor 50 mg twice daily has prn ntg.   5. Essential hypertension: is stable b/p 112/70 will continue lopressor 50 mg twice daily cardizem cd 360 mg daily   6. SBO (small bowel obstruction) is stable will  continue miralax daily as needed  Senna s twice daily   7. Hyperthyroidism: is stable tsh 0.056 free t3: 2.15 will continue tapazole 20 mg twice daily   8. Mixed hyperlipidemia: is stable will continue pravachol 10 mg daily   9. Benign prostatic hyperplasia without urinary symptoms: is stable will continue flomax 0.4 mg daily prescar 5 mg daily   Has urinary retention:  he has failed voiding trial has foley   9. GERD without esophagitis: is stable will continue to monitor    10. Protein calorie malnutrition: is without stable albumin 2.8 will continue supplement daily will begin prostat 30 cc twice daily   11. Radiculopathy: is stable will have him follow up with neurosurgeon on an outpatient basis.    MD is aware of resident's narcotic use and is in agreement with current plan of care. We will attempt to wean resident as appropriate.  Ok Edwards NP [facility]  Contact [phone] Monday through Friday 8am- 5pm  After hours call [phone]

## 2019-04-20 ENCOUNTER — Encounter: Payer: Self-pay | Admitting: Internal Medicine

## 2019-04-20 ENCOUNTER — Non-Acute Institutional Stay (SKILLED_NURSING_FACILITY): Payer: PPO | Admitting: Internal Medicine

## 2019-04-20 DIAGNOSIS — N390 Urinary tract infection, site not specified: Secondary | ICD-10-CM | POA: Diagnosis not present

## 2019-04-20 DIAGNOSIS — M5416 Radiculopathy, lumbar region: Secondary | ICD-10-CM

## 2019-04-20 DIAGNOSIS — N401 Enlarged prostate with lower urinary tract symptoms: Secondary | ICD-10-CM

## 2019-04-20 DIAGNOSIS — B964 Proteus (mirabilis) (morganii) as the cause of diseases classified elsewhere: Secondary | ICD-10-CM

## 2019-04-20 DIAGNOSIS — E782 Mixed hyperlipidemia: Secondary | ICD-10-CM

## 2019-04-20 DIAGNOSIS — I480 Paroxysmal atrial fibrillation: Secondary | ICD-10-CM

## 2019-04-20 DIAGNOSIS — J90 Pleural effusion, not elsewhere classified: Secondary | ICD-10-CM

## 2019-04-20 DIAGNOSIS — N138 Other obstructive and reflux uropathy: Secondary | ICD-10-CM

## 2019-04-20 NOTE — Progress Notes (Signed)
: Provider:  Hennie Duos., MD Location:  [facility] Room Number: 159-P Place of Service:  SNF ([phone])  PCP: Celene Squibb, MD Patient Care Team: Celene Squibb, MD as PCP - General (Internal Medicine) Skeet Latch, MD as PCP - Cardiology (Cardiology)  Extended Emergency Contact Information Primary Emergency Contact: Cisco, Mcguane Mobile Phone: [phone] Relation: Daughter Secondary Emergency Contact: Baus,Jay Mobile Phone: [phone] Relation: Son     Allergies: Indomethacin and Iodine-131  Chief Complaint  Patient presents with  . New Admit To SNF    New admission to [facility]    HPI: Patient is an 84 y.o. male CAD status post PCI, atrial fibrillation, hypertension, hyperlipidemia, hypothyroidism, GERD, and recent COVID-19, SBO, hematuria, who presented to [facility] with back pain and leg pain with concern for pneumonia and UTI.  Patient was admitted to [facility] from 3/22-25 where patient was treated for MRSA UTI with vancomycin and Zosyn with a culture that grew out Proteus sensitive to ampicillin.Marland Kitchen  MRI was significant for multilevel disc/facet degeneration causing mild stenosis; physical therapy was recommended.  Patient admitted to skilled nursing facility for OT/PT.  While at skilled nursing facility patient will be followed for atrial fibrillation treated with amiodarone and Cardizem metoprolol and Eliquis, hyperlipidemia treated with Pravachol and BPH treated with Flomax and Proscar.  Past Medical History:  Diagnosis Date  . Acute myocardial infarction, unspecified site, episode of care unspecified   . CAD in native artery    a. cath 01/27/2009 : s/p promus DES to LAD and medical managment of 70-80% mRCA  . Edema   . HTN (hypertension)   . Mixed hyperlipidemia   . Unspecified hypothyroidism     Past Surgical History:  Procedure Laterality Date  . CHOLECYSTECTOMY    . CORONARY STENT PLACEMENT    .  CYSTOSCOPY/RETROGRADE/URETEROSCOPY Bilateral 03/09/2019   Procedure: CYSTOSCOPY, Clot Evacuation, Fulguration;  Surgeon: Festus Aloe, MD;  Location: [place];  Service: Urology;  Laterality: Bilateral;  . HERNIA REPAIR      Allergies as of 04/20/2019      Reactions   Indomethacin Anaphylaxis   But tolerates ibuprofen, aleve   Iodine-131 Anaphylaxis      Medication List    Notice   This visit is during an admission. Changes to the med list made in this visit will be reflected in the After Visit Summary of the admission.     No orders of the defined types were placed in this encounter.   Immunization History  Administered Date(s) Administered  . Influenza-Unspecified 01/09/2019    Social History   Tobacco Use  . Smoking status: Former Smoker    Quit date: 01/20/1957    Years since quitting: 62.2  . Smokeless tobacco: Never Used  Substance Use Topics  . Alcohol use: No    Family history is   Family History  Problem Relation Age of Onset  . Heart attack Father   . Lung cancer Sister       Review of Systems  GENERAL:  no fevers, fatigue, appetite changes SKIN: No itching, or rash EYES: No eye pain, redness, discharge EARS: No earache, tinnitus, change in hearing NOSE: No congestion, drainage or bleeding  MOUTH/THROAT: No mouth or tooth pain, No sore throat RESPIRATORY: No cough, wheezing, SOB CARDIAC: No chest pain, palpitations, lower extremity edema  GI: No abdominal pain, No N/V/D or constipation, No heartburn or reflux  GU: No dysuria, frequency or urgency, or  incontinence  MUSCULOSKELETAL: No unrelieved bone/joint pain NEUROLOGIC: No headache, dizziness or focal weakness PSYCHIATRIC: No c/o anxiety or sadness   Vitals:   04/20/19 1444  BP: (!) 96/58  Pulse: 71  Resp: 18  Temp: 97.8 F (36.6 C)    SpO2 Readings from Last 1 Encounters:  04/14/19 98%   Body mass index is 38.23 kg/m.     Physical Exam  GENERAL APPEARANCE: Alert, conversant,   No acute distress.  SKIN: No diaphoresis rash HEAD: Normocephalic, atraumatic  EYES: Conjunctiva/lids clear. Pupils round, reactive. EOMs intact.  EARS: External exam WNL, canals clear. Hearing grossly normal.  NOSE: No deformity or discharge.  MOUTH/THROAT: Lips w/o lesions  RESPIRATORY: Breathing is even, unlabored. Lung sounds are clear   CARDIOVASCULAR: Heart RRR no murmurs, rubs or gallops. No peripheral edema.   GASTROINTESTINAL: Abdomen is soft, non-tender, not distended w/ normal bowel sounds. GENITOURINARY: Bladder non tender, not distended  MUSCULOSKELETAL: No abnormal joints or musculature NEUROLOGIC:  Cranial nerves 2-12 grossly intact. Moves all extremities  PSYCHIATRIC: Mood and affect appropriate to situation, no behavioral issues  Patient Active Problem List   Diagnosis Date Noted  . HCAP (healthcare-associated pneumonia) 04/11/2019  . UTI (urinary tract infection) due to urinary indwelling catheter ([place]) 04/11/2019  . Pleural effusion 04/11/2019  . Radiculopathy 04/11/2019  . Atrial fibrillation ([place]) 04/11/2019  . Anticoagulated 04/07/2019  . BPH (benign prostatic hyperplasia) 03/28/2019  . GERD without esophagitis 03/28/2019  . Protein-calorie malnutrition, severe ([place]) 03/28/2019  . Klebsiella cystitis 03/18/2019  . Urinary tract infection due to Proteus 03/18/2019  . Palliative care by specialist   . DNR (do not resuscitate)   . Weakness generalized   . Grief   . Gross hematuria   . AKI (acute kidney injury) ([place]) 02/12/2019  . SBO (small bowel obstruction) ([place]) 02/12/2019  . CAD S/P percutaneous coronary angioplasty 02/11/2019  . Atrial fibrillation with RVR ([place]) 02/11/2019  . Mixed hyperlipidemia 09/10/2009  . EDEMA 02/27/2009  . Hyperthyroidism 02/26/2009  . Essential hypertension 02/26/2009  . MYOCARDIAL INFARCTION 02/26/2009      Labs reviewed: Basic Metabolic Panel:    Component Value Date/Time   NA 138 04/14/2019 0527   K 3.6  04/14/2019 0527   CL 105 04/14/2019 0527   CO2 24 04/14/2019 0527   GLUCOSE 105 (H) 04/14/2019 0527   BUN 17 04/14/2019 0527   CREATININE 0.96 04/14/2019 0527   CALCIUM 8.0 (L) 04/14/2019 0527   PROT 5.8 (L) 04/11/2019 1651   ALBUMIN 2.8 (L) 04/11/2019 1651   AST 14 (L) 04/11/2019 1651   ALT 16 04/11/2019 1651   ALKPHOS 86 04/11/2019 1651   BILITOT 0.4 04/11/2019 1651   GFRNONAA >60 04/14/2019 0527   GFRAA >60 04/14/2019 0527    Recent Labs    03/08/19 0421 03/08/19 0421 03/09/19 0429 03/10/19 0336 04/12/19 0516 04/13/19 0539 04/14/19 0527  NA 136   < > 137   < > 138 140 138  K 4.1   < > 4.0   < > 3.7 3.6 3.6  CL 101   < > 103   < > 104 106 105  CO2 25   < > 24   < > 27 26 24   GLUCOSE 116*   < > 105*   < > 109* 121* 105*  BUN 11   < > 12   < > 15 18 17   CREATININE 1.25*   < > 1.46*   < > 1.34* 1.23 0.96  CALCIUM 8.2*   < >  8.1*   < > 7.9* 8.2* 8.0*  MG 1.8  --  1.8  --   --  2.0  --    < > = values in this interval not displayed.   Liver Function Tests: Recent Labs    03/03/19 0737 03/04/19 0311 04/11/19 1651  AST 19 15 14*  ALT 27 23 16   ALKPHOS 82 86 86  BILITOT 0.3 0.5 0.4  PROT 5.1* 5.0* 5.8*  ALBUMIN 2.0* 2.0* 2.8*   No results for input(s): LIPASE, AMYLASE in the last 8760 hours. No results for input(s): AMMONIA in the last 8760 hours. CBC: Recent Labs    03/08/19 0421 03/08/19 0421 03/09/19 0429 03/10/19 0336 03/12/19 0422 04/11/19 1651 04/13/19 0539  WBC 6.8   < > 6.7   < > 7.3 8.1 6.8  NEUTROABS 3.8  --  4.8  --   --  5.2  --   HGB 8.3*   < > 7.9*   < > 7.9* 8.8* 8.9*  HCT 25.1*   < > 24.1*   < > 24.9* 28.4* 28.9*  MCV 91.9   < > 91.6   < > 92.9 94.4 94.4  PLT 227   < > 217   < > 218 230 221   < > = values in this interval not displayed.   Lipid No results for input(s): CHOL, HDL, LDLCALC, TRIG in the last 8760 hours.  Cardiac Enzymes: No results for input(s): CKTOTAL, CKMB, CKMBINDEX, TROPONINI in the last 8760 hours. BNP: Recent  Labs    03/02/19 0439 03/03/19 0737 04/11/19 1651  BNP 317.6* 345.4* 243.7*   No results found for: [facility] Lab Results  Component Value Date   HGBA1C (L) 01/26/2009    4.5 (NOTE) The ADA recommends the following therapeutic goal for glycemic control related to Hgb A1c measurement: Goal of therapy: <6.5 Hgb A1c  Reference: American Diabetes Association: Clinical Practice Recommendations 2010, Diabetes Care, 2010, 33: (Suppl  1).   Lab Results  Component Value Date   TSH 0.013 (L) 03/07/2019   No results found for: VITAMINB12 No results found for: FOLATE Lab Results  Component Value Date   IRON 46 03/10/2019   TIBC 206 (L) 03/10/2019   FERRITIN 199 03/10/2019    Imaging and Procedures obtained prior to SNF admission: No results found.   Not all labs, radiology exams or other studies done during hospitalization come through on my EPIC note; however they are reviewed by me.    Assessment and Plan  Proteus UTI-initially treated vancomycin and Zosyn and then ampicillin after urine cultures SNF-admitted for OT/PT; continue ampicillin 500 mg 4 times daily for 7 more days  Possible H CAP/small pleural effusion/peripheral edema-patient denied any shortness of breath or cough, chest x-ray was atelectasis versus infiltrate with small left pleural effusion, blood culture negative patient was given Lasix.  Lumbosacral radiculopathy-MRI with multilevel disc/facet degeneration causing mild stenosis; patient with difficulty ambulating SNF-admitted for OT/PT  Atrial fibrillation SNF-chronic and stable: Continue amiodarone 200 mg daily, diltiazem 360 mg daily, metoprolol 50 mg twice daily prophylaxed with Eliquis 5 mg twice daily  BPH with obstruction SNF-continue Proscar 5 mg daily and Flomax 0.4 mg daily; continue with Foley catheter  Hyperlipidemia SNF-not stated as uncontrolled; continue Pravachol 20 Proteus mg daily   Time spent greater than 45 minutes;> 50% of time with  patient was spent reviewing records, labs, tests and studies, counseling and developing plan of care  Hennie Duos, MD

## 2019-04-21 ENCOUNTER — Telehealth (INDEPENDENT_AMBULATORY_CARE_PROVIDER_SITE_OTHER): Payer: PPO | Admitting: Cardiology

## 2019-04-21 ENCOUNTER — Encounter: Payer: Self-pay | Admitting: Cardiology

## 2019-04-21 ENCOUNTER — Telehealth: Payer: Self-pay | Admitting: Cardiovascular Disease

## 2019-04-21 VITALS — BP 110/64 | HR 72 | Temp 98.1°F | Resp 20 | Ht 72.0 in | Wt 282.0 lb

## 2019-04-21 DIAGNOSIS — N390 Urinary tract infection, site not specified: Secondary | ICD-10-CM

## 2019-04-21 DIAGNOSIS — E0590 Thyrotoxicosis, unspecified without thyrotoxic crisis or storm: Secondary | ICD-10-CM | POA: Diagnosis not present

## 2019-04-21 DIAGNOSIS — Z9861 Coronary angioplasty status: Secondary | ICD-10-CM

## 2019-04-21 DIAGNOSIS — I480 Paroxysmal atrial fibrillation: Secondary | ICD-10-CM

## 2019-04-21 DIAGNOSIS — I10 Essential (primary) hypertension: Secondary | ICD-10-CM

## 2019-04-21 DIAGNOSIS — Z7901 Long term (current) use of anticoagulants: Secondary | ICD-10-CM

## 2019-04-21 DIAGNOSIS — I2510 Atherosclerotic heart disease of native coronary artery without angina pectoris: Secondary | ICD-10-CM

## 2019-04-21 MED ORDER — DILTIAZEM HCL ER COATED BEADS 180 MG PO CP24: 180.0000 mg | ORAL_CAPSULE | Freq: Every day | ORAL | 1 refills | Status: DC

## 2019-04-21 NOTE — Addendum Note (Signed)
Addended by: Therisa Doyne on: 04/21/2019 01:20 PM   Modules accepted: Orders

## 2019-04-21 NOTE — Assessment & Plan Note (Signed)
On treatment.  

## 2019-04-21 NOTE — Patient Instructions (Signed)
Medication Instructions:   Decrease Diltiazem to 180 mg daily.  *If you need a refill on your cardiac medications before your next appointment, please call your pharmacy*   Follow-Up: At [facility], you and your health needs are our priority.  As part of our continuing mission to provide you with exceptional heart care, we have created designated Provider Care Teams.  These Care Teams include your primary Cardiologist (physician) and Advanced Practice Providers (APPs -  Physician Assistants and Nurse Practitioners) who all work together to provide you with the care you need, when you need it.  We recommend signing up for the patient portal called "MyChart".  Sign up information is provided on this After Visit Summary.  MyChart is used to connect with patients for Virtual Visits (Telemedicine).  Patients are able to view lab/test results, encounter notes, upcoming appointments, etc.  Non-urgent messages can be sent to your provider as well.   To learn more about what you can do with MyChart, go to [email or web address].    Your next appointment:   Keep follow-up with Dr. Oval Linsey as scheduled.

## 2019-04-21 NOTE — Assessment & Plan Note (Signed)
AMI 2014- LAD PCI

## 2019-04-21 NOTE — Assessment & Plan Note (Signed)
Controlled.  

## 2019-04-21 NOTE — Telephone Encounter (Signed)
New Message  Derek Foster from [facility] is calling in to let the office know that she faxed height, weight, medication list, and vitals to [phone] in preparation for patient's virtual visit with Kerin Ransom today 04/21/19.

## 2019-04-21 NOTE — Telephone Encounter (Signed)
Routed to T. Stover CMA who is covering PA for virtual clinic today

## 2019-04-21 NOTE — Assessment & Plan Note (Signed)
Pt has converted to NSR on Amiodarone.  He is being treated for his hyperthyroidism. His Metoprolol has been previously been cut back to 50 mg BID. I'll decrease his Diltiazem from 360 mg daily to 180 mg daily today.

## 2019-04-21 NOTE — Assessment & Plan Note (Signed)
Eliquis 5 mg BID for AF

## 2019-04-21 NOTE — Progress Notes (Signed)
Virtual Visit via Telephone Note   This visit type was conducted due to national recommendations for restrictions regarding the COVID-19 Pandemic (e.g. social distancing) in an effort to limit this patient's exposure and mitigate transmission in our community.  Due to his co-morbid illnesses, this patient is at least at moderate risk for complications without adequate follow up.  This format is felt to be most appropriate for this patient at this time.  The patient did not have access to video technology/had technical difficulties with video requiring transitioning to audio format only (telephone).  All issues noted in this document were discussed and addressed.  No physical exam could be performed with this format.  Please refer to the patient's chart for his  consent to telehealth for [facility].   The patient was identified using 2 identifiers.  Date:  04/21/2019   ID:  Derek Foster, DOB [birth year], MRN [id number]  Patient Location: [facility] Provider Location: Home  PCP:  Derek Squibb, MD  Cardiologist:  Derek Latch, MD  Electrophysiologist:  None   Evaluation Performed:  Follow-Up Visit  Chief Complaint:  none  History of Present Illness:    Derek Foster is a 84 y.o. male with a history of CAD.  He had an anterior MI in 2014 treated with an LAD PCI.  Other past medical history is remarkable for history of HTN and dyslipidemia.  In January 2021 he was admitted with a small bowel obstruction and new onset atrial fibrillation in the setting of active COVID-19.  He had recently lost his wife to COVID-19 prior to that admission.  Cardiology was consulted.  He required multiple medications for rate control and it turns out he was hyperthyroid.  Echocardiogram during that admission revealed an ejection fraction of 60 to 65% with moderate LVH.  He was discharged on amiodarone, Lanoxin, metoprolol 200 mg twice daily, and diltiazem 360 mg daily.  He was also placed on  Eliquis.  He did have acute renal insufficiency and gross hematuria during that hospitalization but that improved by discharge. Dr Oval Linsey saw the patient in follow up in 03/16/2019 and decreased his Amiodarone to 200 mg daily. He was discharged to SNF then but eventually was able to return home.  I saw him as a virtual f/u 04/07/2019.  His heart rate was occasionally in the 50's and I decreased his Metoprolol to 10 mg BID and stopped his Lanoxin. The plan was for two week f/u but he ended up back in the ED 04/11/2019 with multiple complaints.  He was diagnosed with M/S back pain, UTI, and suspected pneumonia. He was discharged back to [facility] 04/14/2019.  His EKG shows NSR 60 on 04/11/2019.  Today's visit was a scheduled f/u from my last virtual visit.  I spoke to the patient at the nursing home.  He had no new complaints, he is unaware of any tachycardia.  His VS appear stable- HR 70. The nursing home phone and Internet were down so I did not have access to his medications but going by his discharge medications from 3/25 his Metoprolol had been decreased further to 50 mg BID.    The patient does not have symptoms concerning for COVID-19 infection (fever, chills, cough, or new shortness of breath).    Past Medical History:  Diagnosis Date  . Acute myocardial infarction, unspecified site, episode of care unspecified   . CAD in native artery    a. cath 01/27/2009 : s/p promus DES to LAD  and medical managment of 70-80% mRCA  . Edema   . HTN (hypertension)   . Mixed hyperlipidemia   . Unspecified hypothyroidism    Past Surgical History:  Procedure Laterality Date  . CHOLECYSTECTOMY    . CORONARY STENT PLACEMENT    . CYSTOSCOPY/RETROGRADE/URETEROSCOPY Bilateral 03/09/2019   Procedure: CYSTOSCOPY, Clot Evacuation, Fulguration;  Surgeon: Festus Aloe, MD;  Location: [facility];  Service: Urology;  Laterality: Bilateral;  . HERNIA REPAIR       Current Meds  Medication Sig  . Amino Acids-Protein Hydrolys  (FEEDING SUPPLEMENT, PRO-STAT SUGAR FREE 64,) LIQD Take 30 mLs by mouth 3 (three) times daily with meals.  Marland Kitchen amiodarone (PACERONE) 200 MG tablet Take 200 mg by mouth daily.  Marland Kitchen ampicillin (PRINCIPEN) 500 MG capsule Take 1 capsule (500 mg total) by mouth 4 (four) times daily.  Marland Kitchen apixaban (ELIQUIS) 5 MG TABS tablet Take 1 tablet (5 mg total) by mouth 2 (two) times daily.  Roseanne Kaufman [place]-Castor Oil (VENELEX) OINT Apply 1 application topically in the morning, at noon, and at bedtime. Apply to buttocks and scrotum due to erythema and excoriations  . benzonatate (TESSALON) 200 MG capsule Take 200 mg by mouth every 8 (eight) hours as needed for cough.   . bisacodyl (DULCOLAX) 10 MG suppository Place 1 suppository (10 mg total) rectally daily as needed for severe constipation.  . Coenzyme Q10 200 MG capsule Take 200 mg by mouth daily.    Marland Kitchen diltiazem (CARDIZEM CD) 360 MG 24 hr capsule Take 1 capsule (360 mg total) by mouth daily.  . finasteride (PROSCAR) 5 MG tablet Take 1 tablet (5 mg total) by mouth daily.  . fish oil-omega-3 fatty acids 1000 MG capsule Take 2 g by mouth daily.    . furosemide (LASIX) 20 MG tablet Take 20 mg by mouth daily.  . methimazole (TAPAZOLE) 10 MG tablet Take 20 mg by mouth 2 (two) times daily.   . methocarbamol (ROBAXIN) 750 MG tablet Take 750 mg by mouth every 8 (eight) hours as needed for muscle spasms.   . metoprolol tartrate (LOPRESSOR) 100 MG tablet Take 0.5 tablets (50 mg total) by mouth 2 (two) times daily.  . Multiple Vitamin (MULTIVITAMIN) tablet Take 1 tablet by mouth daily.    . nitroGLYCERIN (NITROSTAT) 0.4 MG SL tablet Place 1 tablet (0.4 mg total) under the tongue every 5 (five) minutes as needed.  . Nutritional Supplements (ENSURE CLEAR) LIQD Take 237 mLs by mouth daily.  . ondansetron (ZOFRAN) 4 MG tablet Take 4 mg by mouth every 6 (six) hours as needed for nausea or vomiting.  . polyethylene glycol (MIRALAX / GLYCOLAX) 17 g packet Take 17 g by mouth daily as  needed for mild constipation.  . potassium chloride (KLOR-CON) 10 MEQ tablet Take 10 mEq by mouth every evening.   . pravastatin (PRAVACHOL) 10 MG tablet Take 1 tablet (10 mg total) by mouth daily at 6 PM.  . senna-docusate (SENOKOT-S) 8.6-50 MG tablet Take 1 tablet by mouth 2 (two) times daily.  . tamsulosin (FLOMAX) 0.4 MG CAPS capsule Take 1 capsule (0.4 mg total) by mouth daily after breakfast.     Allergies:   Indomethacin and Iodine-131   Social History   Tobacco Use  . Smoking status: Former Smoker    Quit date: 01/20/1957    Years since quitting: 62.2  . Smokeless tobacco: Never Used  Substance Use Topics  . Alcohol use: No  . Drug use: Never     Family Hx:  The patient's family history includes Heart attack in his father; Lung cancer in his sister.  ROS:   Please see the history of present illness.     All other systems reviewed and are negative.   Prior CV studies:   The following studies were reviewed today: Echo 02/13/2019  Labs/Other Tests and Data Reviewed:    EKG:  An ECG dated 04/14/2019 was personally reviewed today and demonstrated:  NSR  Recent Labs: 03/07/2019: TSH 0.013 04/11/2019: ALT 16; B Natriuretic Peptide 243.7 04/13/2019: Hemoglobin 8.9; Magnesium 2.0; Platelets 221 04/14/2019: BUN 17; Creatinine, Ser 0.96; Potassium 3.6; Sodium 138   Recent Lipid Panel Lab Results  Component Value Date/Time   CHOL 138 05/28/2011 10:30 AM   TRIG 81 05/28/2011 10:30 AM   HDL 43 05/28/2011 10:30 AM   CHOLHDL 3.2 05/28/2011 10:30 AM   LDLCALC 79 05/28/2011 10:30 AM    Wt Readings from Last 3 Encounters:  04/21/19 282 lb (127.9 kg)  04/20/19 281 lb 14.4 oz (127.9 kg)  04/17/19 283 lb 3.2 oz (128.5 kg)     Objective:    Vital Signs:  BP 110/64   Pulse 72   Temp 98.1 F (36.7 C)   Resp 20   Ht 6' (1.829 m)   Wt 282 lb (127.9 kg)   BMI 38.25 kg/m    VITAL SIGNS:  reviewed  ASSESSMENT & PLAN:    AF with RVR- New onset Jan 2021 in setting of  COVID, SBO, and hyperthyroidism.  Pt discharged on Amiodarone he is now in NSR- decrease Diltiazem to 180 mg daily.  Keep f/u as scheduled.   Anticoagulated- On Eliquis  CAD- H/O MI- LAD PCI in 2014.- No recent angina  Hyperthyroidism- PCP following  UTI- Hospitalized 3/22/-3/25 with UTI, back pain, pneumonia D/C'd back to SNF.    Plan: Decrease Diltiazem to 180 mg daily- keep f/u with Dr Oval Linsey as scheduled.    COVID-19 Education: The signs and symptoms of COVID-19 were discussed with the patient and how to seek care for testing (follow up with PCP or arrange E-visit).  The importance of social distancing was discussed today.  Time:   Today, I have spent 20 minutes with the patient with telehealth technology discussing the above problems.     Medication Adjustments/Labs and Tests Ordered: Current medicines are reviewed at length with the patient today.  Concerns regarding medicines are outlined above.   Tests Ordered: No orders of the defined types were placed in this encounter.   Medication Changes: No orders of the defined types were placed in this encounter.   Follow Up:   Dr Oval Linsey as scheduled   Signed, Kerin Ransom, PA-C  04/21/2019 9:24 AM    [place]

## 2019-04-21 NOTE — Assessment & Plan Note (Addendum)
Hospitalized 3/22-3/25/2021 for UTI, suspected pneumonia, and back pain.  He was discharged back to SNF.

## 2019-04-23 ENCOUNTER — Encounter: Payer: Self-pay | Admitting: Internal Medicine

## 2019-04-25 ENCOUNTER — Encounter (HOSPITAL_COMMUNITY)
Admission: RE | Admit: 2019-04-25 | Discharge: 2019-04-25 | Disposition: A | Discharge status: Home / Self Care | Payer: PPO | Source: Ambulatory Visit | Attending: Internal Medicine | Admitting: Internal Medicine

## 2019-04-25 ENCOUNTER — Encounter: Payer: Self-pay | Admitting: Internal Medicine

## 2019-04-25 ENCOUNTER — Encounter: Payer: Self-pay | Admitting: Cardiology

## 2019-04-25 ENCOUNTER — Non-Acute Institutional Stay (SKILLED_NURSING_FACILITY): Payer: PPO | Admitting: Internal Medicine

## 2019-04-25 DIAGNOSIS — R310 Gross hematuria: Secondary | ICD-10-CM | POA: Diagnosis not present

## 2019-04-25 DIAGNOSIS — N390 Urinary tract infection, site not specified: Secondary | ICD-10-CM | POA: Insufficient documentation

## 2019-04-25 DIAGNOSIS — N401 Enlarged prostate with lower urinary tract symptoms: Secondary | ICD-10-CM | POA: Diagnosis not present

## 2019-04-25 DIAGNOSIS — N138 Other obstructive and reflux uropathy: Secondary | ICD-10-CM

## 2019-04-25 DIAGNOSIS — R609 Edema, unspecified: Secondary | ICD-10-CM

## 2019-04-25 DIAGNOSIS — E43 Unspecified severe protein-calorie malnutrition: Secondary | ICD-10-CM | POA: Diagnosis not present

## 2019-04-25 DIAGNOSIS — I480 Paroxysmal atrial fibrillation: Secondary | ICD-10-CM

## 2019-04-25 DIAGNOSIS — J90 Pleural effusion, not elsewhere classified: Secondary | ICD-10-CM | POA: Diagnosis not present

## 2019-04-25 LAB — BASIC METABOLIC PANEL
Anion gap: 10 (ref 5–15)
BUN: 21 mg/dL (ref 8–23)
CO2: 30 mmol/L (ref 22–32)
Calcium: 8.4 mg/dL — ABNORMAL LOW (ref 8.9–10.3)
Chloride: 99 mmol/L (ref 98–111)
Creatinine, Ser: 1.53 mg/dL — ABNORMAL HIGH (ref 0.61–1.24)
GFR calc Af Amer: 48 mL/min — ABNORMAL LOW (ref >60)
GFR calc non Af Amer: 42 mL/min — ABNORMAL LOW (ref >60)
Glucose, Bld: 113 mg/dL — ABNORMAL HIGH (ref 70–99)
Potassium: 3.6 mmol/L (ref 3.5–5.1)
Sodium: 139 mmol/L (ref 135–145)

## 2019-04-25 NOTE — Telephone Encounter (Signed)
Error

## 2019-04-25 NOTE — Progress Notes (Signed)
Location:    [facility] Room Number: 159/P Place of Service:  SNF [phone]) Provider:  Cory Roughen, MD  Patient Care Team: Celene Squibb, MD as PCP - General (Internal Medicine) Skeet Latch, MD as PCP - Cardiology (Cardiology)  Extended Emergency Contact Information Primary Emergency Contact: Leonce, Jett Mobile Phone: [phone] Relation: Daughter Secondary Emergency Contact: Lucatero,Jay Mobile Phone: [phone] Relation: Son  Code Status:  DNR Goals of care: Advanced Directive information Advanced Directives 04/25/2019  Does Patient Have a Medical Advance Directive? Yes  Type of Advance Directive Out of facility DNR (pink MOST or yellow form)  Does patient want to make changes to medical advance directive? No - Patient declined  Would patient like information on creating a medical advance directive? -  Pre-existing out of facility DNR order (yellow form or pink MOST form) Yellow form placed in chart (order not valid for inpatient use)     Chief Complaint  Patient presents with  . Follow-up    Hematuria   As well as follow-up of weight gain  HPI:  Pt is a 84 y.o. male seen today for an acute visit for follow-up of hematuria as well as follow-up of weight gain. He was recently admitted here he does have a history of coronary arteries disease as well as atrial fibrillation hypertension hyperlipidemia hypothyroidism GERD recent COVID-19 as well as a history of hematuria.  He was treated in the hospital for pneumonia and UTI.  Treated with vancomycin and Zosyn-culture grew out Proteus sensitive to ampicillin.  MRI did show significant multilevel disc facet degeneration with stenosis.  Physical therapy was recommended.  He also has had some weight gain and increased edema and his diuretic has been increased to Lasix 40 mg a day-apparently inadvertently he received double dose yesterday-he appears to be stable today however creatinine  has gone up somewhat from baseline at 1.53 this may reflect the added diuretic.  An update BMP is pending later this week his diuretic now is back down to recommend a dose of 40 mg a day.  His weight actually appears to be down about 7 pounds since late March.  He does not complain of any shortness of breath he still has evidence of edema however.  In regards to the hematuria he is followed by urology and actually has a urology consult tomorrow.  He does have an indwelling Foley catheter per nursing this is not unusual at times when there is trauma to the catheter he will have some bleeding-they have contacted urology and again they are aware and will follow up with him tomorrow.  Of note he is on Eliquis will await urology input tomorrow on continuing this.  He also has lab work scheduled for later this week with a CBC and BMP.  .  Currently he does not really have any acute complaints does not really complain of any dysuria or suprapubic pain or shortness of breath from baseline.      Past Medical History:  Diagnosis Date  . Acute myocardial infarction, unspecified site, episode of care unspecified   . CAD in native artery    a. cath 01/27/2009 : s/p promus DES to LAD and medical managment of 70-80% mRCA  . Edema   . HTN (hypertension)   . Mixed hyperlipidemia   . Unspecified hypothyroidism    Past Surgical History:  Procedure Laterality Date  . CHOLECYSTECTOMY    . CORONARY STENT PLACEMENT    . CYSTOSCOPY/RETROGRADE/URETEROSCOPY  Bilateral 03/09/2019   Procedure: CYSTOSCOPY, Clot Evacuation, Fulguration;  Surgeon: Festus Aloe, MD;  Location: [facility];  Service: Urology;  Laterality: Bilateral;  . HERNIA REPAIR      Allergies  Allergen Reactions  . Indomethacin Anaphylaxis    But tolerates ibuprofen, aleve  . Iodine-131 Anaphylaxis    Outpatient Encounter Medications as of 04/25/2019  Medication Sig  . Amino Acids-Protein Hydrolys (FEEDING SUPPLEMENT, PRO-STAT SUGAR  FREE 64,) LIQD Take 30 mLs by mouth 3 (three) times daily with meals.  Marland Kitchen amiodarone (PACERONE) 200 MG tablet Take 200 mg by mouth daily.  Marland Kitchen apixaban (ELIQUIS) 5 MG TABS tablet Take 1 tablet (5 mg total) by mouth 2 (two) times daily.  Roseanne Kaufman [place]-Castor Oil (VENELEX) OINT Apply 1 application topically in the morning, at noon, and at bedtime. Apply to buttocks and scrotum due to erythema and excoriations  . benzonatate (TESSALON) 200 MG capsule Take 200 mg by mouth every 8 (eight) hours as needed for cough.   . bisacodyl (DULCOLAX) 10 MG suppository Place 1 suppository (10 mg total) rectally daily as needed for severe constipation.  . Coenzyme Q10 200 MG capsule Take 200 mg by mouth daily.    Marland Kitchen diltiazem (CARDIZEM CD) 180 MG 24 hr capsule Take 1 capsule (180 mg total) by mouth daily.  . finasteride (PROSCAR) 5 MG tablet Take 1 tablet (5 mg total) by mouth daily.  . fish oil-omega-3 fatty acids 1000 MG capsule Take 2 g by mouth daily.    . furosemide (LASIX) 20 MG tablet Take 20 mg by mouth daily.  . methimazole (TAPAZOLE) 10 MG tablet Take 20 mg by mouth 2 (two) times daily.   . methocarbamol (ROBAXIN) 750 MG tablet Take 750 mg by mouth every 8 (eight) hours as needed for muscle spasms.   . metoprolol tartrate (LOPRESSOR) 100 MG tablet Take 0.5 tablets (50 mg total) by mouth 2 (two) times daily.  . Multiple Vitamin (MULTIVITAMIN) tablet Take 1 tablet by mouth daily.    . nitroGLYCERIN (NITROSTAT) 0.4 MG SL tablet Place 1 tablet (0.4 mg total) under the tongue every 5 (five) minutes as needed.  . NON FORMULARY Diet: _____ Regular, ___x___ NAS, _______Consistent Carbohydrate, _______NPO _____Other  . Nutritional Supplements (ENSURE CLEAR) LIQD Take 237 mLs by mouth daily.  . ondansetron (ZOFRAN) 4 MG tablet Take 4 mg by mouth every 6 (six) hours as needed for nausea or vomiting.  . polyethylene glycol (MIRALAX / GLYCOLAX) 17 g packet Take 17 g by mouth daily as needed for mild constipation.  .  potassium chloride (KLOR-CON) 10 MEQ tablet Take 10 mEq by mouth every evening.   . pravastatin (PRAVACHOL) 10 MG tablet Take 1 tablet (10 mg total) by mouth daily at 6 PM.  . senna-docusate (SENOKOT-S) 8.6-50 MG tablet Take 1 tablet by mouth 2 (two) times daily.  . tamsulosin (FLOMAX) 0.4 MG CAPS capsule Take 1 capsule (0.4 mg total) by mouth daily after breakfast.  . [DISCONTINUED] ampicillin (PRINCIPEN) 500 MG capsule Take 1 capsule (500 mg total) by mouth 4 (four) times daily.  . [DISCONTINUED] simvastatin (ZOCOR) 20 MG tablet Take 1 tablet (20 mg total) by mouth every evening.   No facility-administered encounter medications on file as of 04/25/2019.    Review of Systems In general not complaining of any fever or chills.  Skin does not complain of rashes or itching or diaphoresis.  Does not complain of any increased bruising or bleeding again he has had some hematuria.  Head  ears eyes nose mouth and throat is not complain of visual changes or sore throat.  Respiratory does not complain of being short of breath or having a cough currently.  Cardiac does not complain of chest pain does have edema apparently relatively unchanged.  GU does have an indwelling Foley catheter has had hematuria does not complain of dysuria or suprapubic discomfort at this time.  Musculoskeletal is not complaining of joint pain he does have degenerative disease as noted above his back apparently has had some back pain at times.  Neurologic does not complain of dizziness headache or syncope.  And psych is not complaining of being depressed or anxious Immunization History  Administered Date(s) Administered  . Influenza-Unspecified 01/09/2019   Pertinent  Health Maintenance Due  Topic Date Due  . PNA vac Low Risk Adult (1 of 2 - PCV13) Never done  . INFLUENZA VACCINE  08/21/2019   Fall Risk  08/20/2018  Falls in the past year? 0  Comment Emmi Telephone Survey: data to providers prior to load    Functional Status Survey:    Vitals:   04/25/19 1634  BP: 137/67  Pulse: 80  Resp: 16  Temp: 98.7 F (37.1 C)  TempSrc: Oral  Weight: 275 lb 4.8 oz (124.9 kg)  Height: 6' (1.829 m)   Body mass index is 37.34 kg/m. Physical Exam  In general he is this is a pleasant elderly male in no distress resting comfortably in bed.  His skin is warm and dry.  Eyes visual acuity appears to be intact sclera and conjunctive are clear.  Oropharynx is clear mucous membranes moist.  Chest is clear to auscultation there is no labored breathing.  Heart is regular rhythm slightly bradycardic in the 50s he has I would say 2+ lower extremity edema bilaterally.  Abdomen is somewhat obese soft nontender with positive bowel sounds.  GU does have an indwelling Foley catheter it appears this has just been emptied there is a trace of blood in the bag I do not really see any evidence of clots there is no active bleeding from the penis  Musculoskeletal Limited exam since he is in bed but appears able to move all extremities x4 at relative baseline with some lower extremity weakness.  Neurologic is grossly intact cannot really appreciate lateralizing findings.  Psych he is alert and oriented pleasant and appropriate   Labs reviewed: Recent Labs    03/08/19 0421 03/08/19 0421 03/09/19 0429 03/10/19 0336 04/13/19 0539 04/14/19 0527 04/25/19 0745  NA 136   < > 137   < > 140 138 139  K 4.1   < > 4.0   < > 3.6 3.6 3.6  CL 101   < > 103   < > 106 105 99  CO2 25   < > 24   < > 26 24 30   GLUCOSE 116*   < > 105*   < > 121* 105* 113*  BUN 11   < > 12   < > 18 17 21   CREATININE 1.25*   < > 1.46*   < > 1.23 0.96 1.53*  CALCIUM 8.2*   < > 8.1*   < > 8.2* 8.0* 8.4*  MG 1.8  --  1.8  --  2.0  --   --    < > = values in this interval not displayed.   Recent Labs    03/03/19 0737 03/04/19 0311 04/11/19 1651  AST 19 15 14*  ALT 27 23 16  ALKPHOS 82 86 86  BILITOT 0.3 0.5 0.4  PROT 5.1* 5.0*  5.8*  ALBUMIN 2.0* 2.0* 2.8*   Recent Labs    03/08/19 0421 03/08/19 0421 03/09/19 0429 03/10/19 0336 03/12/19 0422 04/11/19 1651 04/13/19 0539  WBC 6.8   < > 6.7   < > 7.3 8.1 6.8  NEUTROABS 3.8  --  4.8  --   --  5.2  --   HGB 8.3*   < > 7.9*   < > 7.9* 8.8* 8.9*  HCT 25.1*   < > 24.1*   < > 24.9* 28.4* 28.9*  MCV 91.9   < > 91.6   < > 92.9 94.4 94.4  PLT 227   < > 217   < > 218 230 221   < > = values in this interval not displayed.   Lab Results  Component Value Date   TSH 0.013 (L) 03/07/2019   Lab Results  Component Value Date   HGBA1C (L) 01/26/2009    4.5 (NOTE) The ADA recommends the following therapeutic goal for glycemic control related to Hgb A1c measurement: Goal of therapy: <6.5 Hgb A1c  Reference: American Diabetes Association: Clinical Practice Recommendations 2010, Diabetes Care, 2010, 33: (Suppl  1).   Lab Results  Component Value Date   CHOL 138 05/28/2011   HDL 43 05/28/2011   LDLCALC 79 05/28/2011   TRIG 81 05/28/2011   CHOLHDL 3.2 05/28/2011    Significant Diagnostic Results in last 30 days:  MR Lumbar Spine W Wo Contrast  Result Date: 04/12/2019 CLINICAL DATA:  Low back pain left leg pain EXAM: MRI LUMBAR SPINE WITHOUT AND WITH CONTRAST TECHNIQUE: Multiplanar and multiecho pulse sequences of the lumbar spine were obtained without and with intravenous contrast. CONTRAST:  46mL GADAVIST GADOBUTROL 1 MMOL/ML IV SOLN COMPARISON:  None. FINDINGS: Segmentation:  Normal.  Lowest disc space L5-S1 Alignment:  Normal Vertebrae: Negative for fracture or mass. Scattered hemangiomata L3, L4, L5. Conus medullaris and cauda equina: Conus extends to the L1-2 level. Conus and cauda equina appear normal. Paraspinal and other soft tissues: Negative for paraspinous mass or adenopathy. Negative for soft tissue edema or fluid collection. Disc levels: L1-2: Mild degenerative change without disc protrusion or stenosis L2-3: Disc degeneration with disc bulging and diffuse  endplate spurring. Shallow broad-based central disc protrusion and mild facet degeneration. Mild subarticular stenosis bilaterally. Mild narrowing of the spinal canal without significant stenosis. L3-4: Disc degeneration with disc bulging and endplate spurring left greater than right. Moderate facet hypertrophy. Mild subarticular stenosis on the left. Mild narrowing of the canal without significant stenosis L4-5: Moderate disc degeneration with disc space narrowing and endplate spurring right greater than left. Bilateral facet hypertrophy. Moderate subarticular stenosis on the right due to spurring L5-S1: Negative IMPRESSION: 1. No acute abnormality 2. Multilevel disc and facet degeneration causing mild stenosis as described above. Electronically Signed   By: Franchot Gallo M.D.   On: 04/12/2019 08:20   DG Chest Port 1 View  Result Date: 04/11/2019 CLINICAL DATA:  84 year old male with shortness of breath and cough. EXAM: PORTABLE CHEST 1 VIEW COMPARISON:  Chest radiograph dated 02/15/2019. FINDINGS: Faint left lung base hazy density may represent atelectasis although a small left pleural effusion is not excluded. Clinical correlation is recommended. The right lung is clear. No pneumothorax. There is stable cardiomegaly. Coronary vascular calcification or stent. Atherosclerotic calcification of the aorta. No acute osseous pathology. Degenerative changes of the spine. IMPRESSION: 1. Left lung base atelectasis versus infiltrate.  A small left pleural effusion may be present. 2. Stable cardiomegaly. Electronically Signed   By: Anner Crete M.D.   On: 04/11/2019 17:41    Assessment/Plan  #1 history of hematuria apparently this is intermittent as noted above he will be seeing urology tomorrow they have been consulted and are aware of this.  Currently I do not see any active bleeding.  He does have a history of BPH with obstruction and continues on Proscar 5 mg a day Flomax 0.4 mg a day and has a Foley  catheter  He previously was treated for a Proteus UTI with vancomycin-Zosyn and subsequently ampicillin after urine culture.  2.  History of healthcare associated pneumonia small pleural effusion with peripheral edema he is on Lasix he denies any increased shortness of breath or cough today-again he did have 80 mg of Lasix yesterday but now is back at the prescribed 40 mg a day-edema persists but he is not really complaining of any shortness of breath.  At this point will continue 40 mg of Lasix a day monitor appears he has lost some weight.  3.  History of renal insufficiency creatinine is up somewhat at 1.53 today I suspect this reflects possibly the increased diuretic he received yesterday.  Electrolytes remain within normal range  Baseline appears to be more in the lower to mid ones-this will be updated later this week  #4 atrial fibrillation-he continues on Eliquis 5 mg twice daily for anticoagulation-will await urology input about the role of Eliquis and any hematuria-at this point recommendation is to continue and await their input.  He has been on diltiazem 360 mg a day extended release-it appears per review of cardiology note the recommendation was to reduce this to 180 mg because of bradycardia-will have nursing contact him to confirm this-suspect likely this will be reduced down to 180 mg a day.  He also is on Lopressor 50 mg twice daily as well as amiodarone 200 mg a day  [id number]

## 2019-04-26 DIAGNOSIS — R338 Other retention of urine: Secondary | ICD-10-CM | POA: Diagnosis not present

## 2019-04-26 DIAGNOSIS — N401 Enlarged prostate with lower urinary tract symptoms: Secondary | ICD-10-CM | POA: Diagnosis not present

## 2019-04-28 ENCOUNTER — Non-Acute Institutional Stay (SKILLED_NURSING_FACILITY): Payer: PPO | Admitting: Adult Health

## 2019-04-28 ENCOUNTER — Encounter: Payer: Self-pay | Admitting: Adult Health

## 2019-04-28 ENCOUNTER — Encounter (HOSPITAL_COMMUNITY)
Admission: RE | Admit: 2019-04-28 | Discharge: 2019-04-28 | Disposition: A | Discharge status: Home / Self Care | Payer: PPO | Source: Ambulatory Visit | Attending: Adult Health | Admitting: Adult Health

## 2019-04-28 DIAGNOSIS — Z48816 Encounter for surgical aftercare following surgery on the genitourinary system: Secondary | ICD-10-CM | POA: Insufficient documentation

## 2019-04-28 DIAGNOSIS — I4891 Unspecified atrial fibrillation: Secondary | ICD-10-CM | POA: Diagnosis not present

## 2019-04-28 DIAGNOSIS — I10 Essential (primary) hypertension: Secondary | ICD-10-CM

## 2019-04-28 DIAGNOSIS — N390 Urinary tract infection, site not specified: Secondary | ICD-10-CM | POA: Insufficient documentation

## 2019-04-28 DIAGNOSIS — R531 Weakness: Secondary | ICD-10-CM | POA: Diagnosis not present

## 2019-04-28 DIAGNOSIS — N4289 Other specified disorders of prostate: Secondary | ICD-10-CM | POA: Diagnosis not present

## 2019-04-28 LAB — CBC
HCT: 32.7 % — ABNORMAL LOW (ref 39.0–52.0)
Hemoglobin: 9.9 g/dL — ABNORMAL LOW (ref 13.0–17.0)
MCH: 28.1 pg (ref 26.0–34.0)
MCHC: 30.3 g/dL (ref 30.0–36.0)
MCV: 92.9 fL (ref 80.0–100.0)
Platelets: 235 10*3/uL (ref 150–400)
RBC: 3.52 MIL/uL — ABNORMAL LOW (ref 4.22–5.81)
RDW: 14.2 % (ref 11.5–15.5)
WBC: 6.3 10*3/uL (ref 4.0–10.5)
nRBC: 0 % (ref 0.0–0.2)

## 2019-04-28 LAB — BASIC METABOLIC PANEL
Anion gap: 10 (ref 5–15)
BUN: 20 mg/dL (ref 8–23)
CO2: 29 mmol/L (ref 22–32)
Calcium: 8.7 mg/dL — ABNORMAL LOW (ref 8.9–10.3)
Chloride: 101 mmol/L (ref 98–111)
Creatinine, Ser: 1.47 mg/dL — ABNORMAL HIGH (ref 0.61–1.24)
GFR calc Af Amer: 51 mL/min — ABNORMAL LOW (ref >60)
GFR calc non Af Amer: 44 mL/min — ABNORMAL LOW (ref >60)
Glucose, Bld: 112 mg/dL — ABNORMAL HIGH (ref 70–99)
Potassium: 3.9 mmol/L (ref 3.5–5.1)
Sodium: 140 mmol/L (ref 135–145)

## 2019-04-28 NOTE — Progress Notes (Signed)
Location:    [place] Room Number: 159/P Place of Service:  SNF (31)   CODE STATUS: DNR  Allergies  Allergen Reactions  . Indomethacin Anaphylaxis    But tolerates ibuprofen, aleve  . Iodine-131 Anaphylaxis    Chief Complaint  Patient presents with  . Acute Visit    care plan meeting      HPI:  We have come together for his care plan meeting. Family present. BIMS 15/15 mood 0/30. Weight is stable; has fair appetite. No falls. He is doing well with therapy BRP per self now. Has foley. His goal is to return home within the next week. There are no reports of uncontrolled pain. He continues to be followed for his chronic illnesses including: Atrial fibrillation with RVR   Weakness generalized Essential hypertension  Past Medical History:  Diagnosis Date  . Acute myocardial infarction, unspecified site, episode of care unspecified   . CAD in native artery    a. cath 01/27/2009 : s/p promus DES to LAD and medical managment of 70-80% mRCA  . Edema   . HTN (hypertension)   . Mixed hyperlipidemia   . Unspecified hypothyroidism     Past Surgical History:  Procedure Laterality Date  . CHOLECYSTECTOMY    . CORONARY STENT PLACEMENT    . CYSTOSCOPY/RETROGRADE/URETEROSCOPY Bilateral 03/09/2019   Procedure: CYSTOSCOPY, Clot Evacuation, Fulguration;  Surgeon: Festus Aloe, MD;  Location: [place];  Service: Urology;  Laterality: Bilateral;  . HERNIA REPAIR      Social History   Socioeconomic History  . Marital status: Married    Spouse name: Not on file  . Number of children: Not on file  . Years of education: Not on file  . Highest education level: Not on file  Occupational History  . Occupation: Charity fundraiser  Tobacco Use  . Smoking status: Former Smoker    Quit date: 01/20/1957    Years since quitting: 62.3  . Smokeless tobacco: Never Used  Substance and Sexual Activity  . Alcohol use: No  . Drug use: Never  . Sexual activity: Not on file  Other  Topics Concern  . Not on file  Social History Narrative   Quit smoking about 62 years ago.   Social Determinants of Health   Financial Resource Strain:   . Difficulty of Paying Living Expenses:   Food Insecurity:   . Worried About Charity fundraiser in the Last Year:   . Arboriculturist in the Last Year:   Transportation Needs:   . Film/video editor (Medical):   Marland Kitchen Lack of Transportation (Non-Medical):   Physical Activity:   . Days of Exercise per Week:   . Minutes of Exercise per Session:   Stress:   . Feeling of Stress :   Social Connections:   . Frequency of Communication with Friends and Family:   . Frequency of Social Gatherings with Friends and Family:   . Attends Religious Services:   . Active Member of Clubs or Organizations:   . Attends Archivist Meetings:   Marland Kitchen Marital Status:   Intimate Partner Violence:   . Fear of Current or Ex-Partner:   . Emotionally Abused:   Marland Kitchen Physically Abused:   . Sexually Abused:    Family History  Problem Relation Age of Onset  . Heart attack Father   . Lung cancer Sister       VITAL SIGNS BP (!) 142/68   Pulse 76   Temp 98.4  F (36.9 C) (Oral)   Resp 20   Ht 6' (1.829 m)   Wt 275 lb 4.8 oz (124.9 kg)   BMI 37.34 kg/m   Outpatient Encounter Medications as of 04/28/2019  Medication Sig  . Amino Acids-Protein Hydrolys (FEEDING SUPPLEMENT, PRO-STAT SUGAR FREE 64,) LIQD Take 30 mLs by mouth 3 (three) times daily with meals.  Marland Kitchen amiodarone (PACERONE) 200 MG tablet Take 200 mg by mouth daily.  Marland Kitchen apixaban (ELIQUIS) 5 MG TABS tablet Take 1 tablet (5 mg total) by mouth 2 (two) times daily.  Roseanne Kaufman Peru-Castor Oil (VENELEX) OINT Apply 1 application topically in the morning, at noon, and at bedtime. Apply to buttocks and scrotum due to erythema and excoriations  . benzonatate (TESSALON) 200 MG capsule Take 200 mg by mouth every 8 (eight) hours as needed for cough.   . bisacodyl (DULCOLAX) 10 MG suppository Place 1  suppository (10 mg total) rectally daily as needed for severe constipation.  . Coenzyme Q10 200 MG capsule Take 200 mg by mouth daily.    Marland Kitchen diltiazem (CARDIZEM CD) 180 MG 24 hr capsule Take 1 capsule (180 mg total) by mouth daily.  . finasteride (PROSCAR) 5 MG tablet Take 1 tablet (5 mg total) by mouth daily.  . fish oil-omega-3 fatty acids 1000 MG capsule Take 2 g by mouth daily.    . furosemide (LASIX) 20 MG tablet Take 40 mg by mouth daily.   . methimazole (TAPAZOLE) 10 MG tablet Take 20 mg by mouth 2 (two) times daily.   . methocarbamol (ROBAXIN) 750 MG tablet Take 750 mg by mouth every 8 (eight) hours as needed for muscle spasms.   . metoprolol tartrate (LOPRESSOR) 100 MG tablet Take 0.5 tablets (50 mg total) by mouth 2 (two) times daily.  . Multiple Vitamin (MULTIVITAMIN) tablet Take 1 tablet by mouth daily.    . nitroGLYCERIN (NITROSTAT) 0.4 MG SL tablet Place 1 tablet (0.4 mg total) under the tongue every 5 (five) minutes as needed.  . NON FORMULARY Diet: _____ Regular, ___x___ NAS, _______Consistent Carbohydrate, _______NPO _____Other  . Nutritional Supplements (ENSURE CLEAR) LIQD Take 237 mLs by mouth daily.  . ondansetron (ZOFRAN) 4 MG tablet Take 4 mg by mouth every 6 (six) hours as needed for nausea or vomiting.  . polyethylene glycol (MIRALAX / GLYCOLAX) 17 g packet Take 17 g by mouth daily as needed for mild constipation.  . potassium chloride (KLOR-CON) 10 MEQ tablet Take 10 mEq by mouth every evening.   . pravastatin (PRAVACHOL) 10 MG tablet Take 1 tablet (10 mg total) by mouth daily at 6 PM.  . senna-docusate (SENOKOT-S) 8.6-50 MG tablet Take 1 tablet by mouth 2 (two) times daily.  . tamsulosin (FLOMAX) 0.4 MG CAPS capsule Take 1 capsule (0.4 mg total) by mouth daily after breakfast.  . [DISCONTINUED] simvastatin (ZOCOR) 20 MG tablet Take 1 tablet (20 mg total) by mouth every evening.   No facility-administered encounter medications on file as of 04/28/2019.      SIGNIFICANT DIAGNOSTIC EXAMS  PREVIOUS  02-13-19: 2-d echo:  1. Left ventricular ejection fraction, by visual estimation, is 60 to  65%. The left ventricle has normal function. There is moderately increased  left ventricular hypertrophy.   02-27-19: renal ultrasound:  1. No evidence of hydronephrosis involving either kidney to suggest urinary tract obstruction. 2. Mild diffuse cortical thinning involving both kidneys. No focal abnormality involving either kidney and no visible shadowing calculi.  04-11-19: chest x-ray: 1. Left lung base atelectasis  versus infiltrate. A small left pleural effusion may be present. 2. Stable cardiomegaly.  04-12-19: lumbar spine MRI:  1. No acute abnormality 2. Multilevel disc and facet degeneration causing mild stenosis as described above  NO NEWS EXAMS.   LABS REVIEWED PREVOIUS  02-11-19: wbc 12.9; hgb 13.8; hct 42.5; mcv 92.6 plt 192; glucose 131; bun 43; creat 1.71; k+ 4.7; na++ 145; ca 8.8; liver normal albumin 3.4 tsh 0.026 02-12-19: free t3: 3.4 free t4: 1.76 02-14-19: digoxin 0.9 02-23-19: tsh 0.056 free t3: 2.15 03-02-19: glucose 116; bun 22; creat 1.41; k+ 3.8; na++ 131; ca 7.9; liver normal albumin 1.8 d-dimer 2.3 03-12-19: wbc 7.3; hgb 7.9; hct 24.9; mcv 92.9 plt 218; glucose 100; bun 20; creat 1.70; k+ 3.7; na++ 138; ca 8.1  04-11-19: wbc 8.1; hgb 8.8; hct 28.4; mcv 94.4 plt 230; glucose 104; bun 15; creat 1.27; k+ 4.0; na++ 139; ca 8.0; liver normal albumin 2.8 BNP 243.7; blood culture: no growth; urine culture: proteus mirabilis  04-13-19: wbc 6.8; hgb 8.9; hct 28.9; mcv 94.4 plt 221; glucose 121; bun 18; creat 1.23; k+ 3.6; na++ 140; ca 8.2; mag 2.0 04-14-19: glucose 105; bun 17; creat 0.96; k+ 3.6; na++ 138; ca 8.0    NO NEW LABS.   Review of Systems  Constitutional: Negative for malaise/fatigue.  Respiratory: Negative for cough and shortness of breath.   Cardiovascular: Negative for chest pain, palpitations and leg swelling.  Gastrointestinal:  Negative for abdominal pain, constipation and heartburn.  Musculoskeletal: Negative for back pain, joint pain and myalgias.  Skin: Negative.   Neurological: Negative for dizziness.  Psychiatric/Behavioral: The patient is not nervous/anxious.    Physical Exam Constitutional:      General: He is not in acute distress.    Appearance: He is well-developed. He is not diaphoretic.  Neck:     Thyroid: No thyromegaly.  Cardiovascular:     Rate and Rhythm: Normal rate. Rhythm irregular.     Pulses: Normal pulses.     Heart sounds: Normal heart sounds.  Pulmonary:     Effort: Pulmonary effort is normal. No respiratory distress.     Breath sounds: Normal breath sounds.  Abdominal:     General: Bowel sounds are normal. There is no distension.     Palpations: Abdomen is soft.     Tenderness: There is no abdominal tenderness.  Genitourinary:    Comments: foley Musculoskeletal:        General: Normal range of motion.     Cervical back: Neck supple.     Right lower leg: No edema.     Left lower leg: No edema.  Lymphadenopathy:     Cervical: No cervical adenopathy.  Skin:    General: Skin is warm and dry.  Neurological:     Mental Status: He is alert and oriented to person, place, and time.  Psychiatric:        Mood and Affect: Mood normal.      ASSESSMENT/ PLAN:  TODAY  1. Atrial fibrillation with RVR 2. Weakness generalized 3. Essential hypertension  Will continue therapy as directed Will continue current medications Will continue to monitor his status.  His goal is to return back home.   MD is aware of resident's narcotic use and is in agreement with current plan of care. We will attempt to wean resident as appropriate.  Ok Edwards NP [facility]  Contact [phone] Monday through Friday 8am- 5pm  After hours call [phone]

## 2019-04-29 ENCOUNTER — Non-Acute Institutional Stay (SKILLED_NURSING_FACILITY): Payer: PPO | Admitting: Adult Health

## 2019-04-29 ENCOUNTER — Encounter: Payer: Self-pay | Admitting: Adult Health

## 2019-04-29 DIAGNOSIS — Z9861 Coronary angioplasty status: Secondary | ICD-10-CM

## 2019-04-29 DIAGNOSIS — I4891 Unspecified atrial fibrillation: Secondary | ICD-10-CM

## 2019-04-29 DIAGNOSIS — J90 Pleural effusion, not elsewhere classified: Secondary | ICD-10-CM | POA: Diagnosis not present

## 2019-04-29 DIAGNOSIS — I2510 Atherosclerotic heart disease of native coronary artery without angina pectoris: Secondary | ICD-10-CM

## 2019-04-29 NOTE — Progress Notes (Signed)
Location:    [place] Room Number: 159/P Place of Service:  SNF (31)   CODE STATUS: DNR  Allergies  Allergen Reactions  . Indomethacin Anaphylaxis    But tolerates ibuprofen, aleve  . Iodine-131 Anaphylaxis    Chief Complaint  Patient presents with  . Medical Management of Chronic Issues         Pleural effusion:   Atrial fibrillation with RVR   Coronary artery disease of native artery of native heart without angina:   Weekly follow up for the first 30 days post hospitalization.      HPI:  He is a 84 year old short term rehab patient being seen for the management of his chronic illnesses: pleural effusion; afib; cad. There are no reports of uncontrolled pain; no changes in appetite; no reports of anxiety. Continues to participate in therapy.   Past Medical History:  Diagnosis Date  . Acute myocardial infarction, unspecified site, episode of care unspecified   . CAD in native artery    a. cath 01/27/2009 : s/p promus DES to LAD and medical managment of 70-80% mRCA  . Edema   . HTN (hypertension)   . Mixed hyperlipidemia   . Unspecified hypothyroidism     Past Surgical History:  Procedure Laterality Date  . CHOLECYSTECTOMY    . CORONARY STENT PLACEMENT    . CYSTOSCOPY/RETROGRADE/URETEROSCOPY Bilateral 03/09/2019   Procedure: CYSTOSCOPY, Clot Evacuation, Fulguration;  Surgeon: Festus Aloe, MD;  Location: [place];  Service: Urology;  Laterality: Bilateral;  . HERNIA REPAIR      Social History   Socioeconomic History  . Marital status: Married    Spouse name: Not on file  . Number of children: Not on file  . Years of education: Not on file  . Highest education level: Not on file  Occupational History  . Occupation: Charity fundraiser  Tobacco Use  . Smoking status: Former Smoker    Quit date: 01/20/1957    Years since quitting: 62.3  . Smokeless tobacco: Never Used  Substance and Sexual Activity  . Alcohol use: No  . Drug use: Never  . Sexual  activity: Not on file  Other Topics Concern  . Not on file  Social History Narrative   Quit smoking about 62 years ago.   Social Determinants of Health   Financial Resource Strain:   . Difficulty of Paying Living Expenses:   Food Insecurity:   . Worried About Charity fundraiser in the Last Year:   . Arboriculturist in the Last Year:   Transportation Needs:   . Film/video editor (Medical):   Marland Kitchen Lack of Transportation (Non-Medical):   Physical Activity:   . Days of Exercise per Week:   . Minutes of Exercise per Session:   Stress:   . Feeling of Stress :   Social Connections:   . Frequency of Communication with Friends and Family:   . Frequency of Social Gatherings with Friends and Family:   . Attends Religious Services:   . Active Member of Clubs or Organizations:   . Attends Archivist Meetings:   Marland Kitchen Marital Status:   Intimate Partner Violence:   . Fear of Current or Ex-Partner:   . Emotionally Abused:   Marland Kitchen Physically Abused:   . Sexually Abused:    Family History  Problem Relation Age of Onset  . Heart attack Father   . Lung cancer Sister       VITAL SIGNS BP  124/68   Pulse 69   Temp 97.6 F (36.4 C) (Oral)   Resp 18   Ht 6' (1.829 m)   Wt 275 lb 4.8 oz (124.9 kg)   BMI 37.34 kg/m   Outpatient Encounter Medications as of 04/29/2019  Medication Sig  . Amino Acids-Protein Hydrolys (FEEDING SUPPLEMENT, PRO-STAT SUGAR FREE 64,) LIQD Take 30 mLs by mouth 3 (three) times daily with meals.  Marland Kitchen amiodarone (PACERONE) 200 MG tablet Take 200 mg by mouth daily.  Marland Kitchen apixaban (ELIQUIS) 5 MG TABS tablet Take 1 tablet (5 mg total) by mouth 2 (two) times daily.  Roseanne Kaufman [place]-Castor Oil (VENELEX) OINT Apply 1 application topically in the morning, at noon, and at bedtime. Apply to buttocks and scrotum due to erythema and excoriations  . benzonatate (TESSALON) 200 MG capsule Take 200 mg by mouth every 8 (eight) hours as needed for cough.   . bisacodyl (DULCOLAX) 10  MG suppository Place 1 suppository (10 mg total) rectally daily as needed for severe constipation.  . Coenzyme Q10 200 MG capsule Take 200 mg by mouth daily.    Marland Kitchen diltiazem (CARDIZEM CD) 180 MG 24 hr capsule Take 1 capsule (180 mg total) by mouth daily.  . finasteride (PROSCAR) 5 MG tablet Take 1 tablet (5 mg total) by mouth daily.  . fish oil-omega-3 fatty acids 1000 MG capsule Take 1 g by mouth daily.   . furosemide (LASIX) 20 MG tablet Take 40 mg by mouth daily.   . methimazole (TAPAZOLE) 10 MG tablet Take 20 mg by mouth 2 (two) times daily.   . methocarbamol (ROBAXIN) 750 MG tablet Take 750 mg by mouth every 8 (eight) hours as needed for muscle spasms.   . metoprolol tartrate (LOPRESSOR) 100 MG tablet Take 0.5 tablets (50 mg total) by mouth 2 (two) times daily.  . Multiple Vitamin (MULTIVITAMIN) tablet Take 1 tablet by mouth daily.    . nitroGLYCERIN (NITROSTAT) 0.4 MG SL tablet Place 1 tablet (0.4 mg total) under the tongue every 5 (five) minutes as needed.  . NON FORMULARY Diet: _____ Regular, ___x___ NAS, _______Consistent Carbohydrate, _______NPO _____Other  . Nutritional Supplements (ENSURE CLEAR) LIQD Take 237 mLs by mouth daily.  . ondansetron (ZOFRAN) 4 MG tablet Take 4 mg by mouth every 6 (six) hours as needed for nausea or vomiting.  . polyethylene glycol (MIRALAX / GLYCOLAX) 17 g packet Take 17 g by mouth daily as needed for mild constipation.  . potassium chloride (KLOR-CON) 10 MEQ tablet Take 10 mEq by mouth every evening.   . pravastatin (PRAVACHOL) 10 MG tablet Take 1 tablet (10 mg total) by mouth daily at 6 PM.  . senna-docusate (SENOKOT-S) 8.6-50 MG tablet Take 1 tablet by mouth 2 (two) times daily.  . tamsulosin (FLOMAX) 0.4 MG CAPS capsule Take 1 capsule (0.4 mg total) by mouth daily after breakfast.  . [DISCONTINUED] simvastatin (ZOCOR) 20 MG tablet Take 1 tablet (20 mg total) by mouth every evening.   No facility-administered encounter medications on file as of  04/29/2019.     SIGNIFICANT DIAGNOSTIC EXAMS   PREVIOUS  02-13-19: 2-d echo:  1. Left ventricular ejection fraction, by visual estimation, is 60 to  65%. The left ventricle has normal function. There is moderately increased  left ventricular hypertrophy.   02-27-19: renal ultrasound:  1. No evidence of hydronephrosis involving either kidney to suggest urinary tract obstruction. 2. Mild diffuse cortical thinning involving both kidneys. No focal abnormality involving either kidney and no visible shadowing calculi.  04-11-19: chest x-ray: 1. Left lung base atelectasis versus infiltrate. A small left pleural effusion may be present. 2. Stable cardiomegaly.  04-12-19: lumbar spine MRI:  1. No acute abnormality 2. Multilevel disc and facet degeneration causing mild stenosis as described above  NO NEWS EXAMS.   LABS REVIEWED PREVOIUS  02-11-19: wbc 12.9; hgb 13.8; hct 42.5; mcv 92.6 plt 192; glucose 131; bun 43; creat 1.71; k+ 4.7; na++ 145; ca 8.8; liver normal albumin 3.4 tsh 0.026 02-12-19: free t3: 3.4 free t4: 1.76 02-14-19: digoxin 0.9 02-23-19: tsh 0.056 free t3: 2.15 03-02-19: glucose 116; bun 22; creat 1.41; k+ 3.8; na++ 131; ca 7.9; liver normal albumin 1.8 d-dimer 2.3 03-12-19: wbc 7.3; hgb 7.9; hct 24.9; mcv 92.9 plt 218; glucose 100; bun 20; creat 1.70; k+ 3.7; na++ 138; ca 8.1  04-11-19: wbc 8.1; hgb 8.8; hct 28.4; mcv 94.4 plt 230; glucose 104; bun 15; creat 1.27; k+ 4.0; na++ 139; ca 8.0; liver normal albumin 2.8 BNP 243.7; blood culture: no growth; urine culture: proteus mirabilis  04-13-19: wbc 6.8; hgb 8.9; hct 28.9; mcv 94.4 plt 221; glucose 121; bun 18; creat 1.23; k+ 3.6; na++ 140; ca 8.2; mag 2.0 04-14-19: glucose 105; bun 17; creat 0.96; k+ 3.6; na++ 138; ca 8.0    NO NEW LABS.   Review of Systems  Constitutional: Negative for malaise/fatigue.  Respiratory: Negative for cough and shortness of breath.   Cardiovascular: Negative for chest pain, palpitations and leg swelling.    Gastrointestinal: Negative for abdominal pain, constipation and heartburn.  Musculoskeletal: Negative for back pain, joint pain and myalgias.  Skin: Negative.   Neurological: Negative for dizziness.  Psychiatric/Behavioral: The patient is not nervous/anxious.    Physical Exam Constitutional:      General: He is not in acute distress.    Appearance: He is well-developed. He is not diaphoretic.  Neck:     Thyroid: No thyromegaly.  Cardiovascular:     Rate and Rhythm: Normal rate. Rhythm irregular.     Pulses: Normal pulses.     Heart sounds: Normal heart sounds.  Pulmonary:     Effort: Pulmonary effort is normal. No respiratory distress.     Breath sounds: Normal breath sounds.  Abdominal:     General: Bowel sounds are normal. There is no distension.     Palpations: Abdomen is soft.     Tenderness: There is no abdominal tenderness.  Genitourinary:    Comments: foley Musculoskeletal:        General: Normal range of motion.     Cervical back: Neck supple.     Right lower leg: No edema.     Left lower leg: No edema.  Lymphadenopathy:     Cervical: No cervical adenopathy.  Skin:    General: Skin is warm and dry.  Neurological:     Mental Status: He is alert and oriented to person, place, and time.  Psychiatric:        Mood and Affect: Mood normal.       ASSESSMENT/ PLAN:  TODAY  1. Pleural effusion: is stable will continue lasix 40 mg daily with k+ 10 meq daily   2. Atrial fibrillation with RVR is stable will continue amiodarone 200 mg daily cardizem cd 260 mg daily lopressor 50 mg twice daily for rate control and eliquis 5 mg twice daily   3. Coronary artery disease of native artery of native heart without angina: is stable will continue lopressor 50 mg twice daily has prn ntg.  PREVIOUS   4. Essential hypertension: is stable b/p 124/68 will continue lopressor 50 mg twice daily cardizem cd 360 mg daily   5. SBO (small bowel obstruction) is stable will continue  miralax daily as needed  Senna s twice daily   6. Hyperthyroidism: is stable tsh 0.056 free t3: 2.15 will continue tapazole 20 mg twice daily   7. Mixed hyperlipidemia: is stable will continue pravachol 10 mg daily   8. Benign prostatic hyperplasia without urinary symptoms: is stable will continue flomax 0.4 mg daily prescar 5 mg daily   Has urinary retention:  he has failed voiding trial has foley   9. GERD without esophagitis: is stable will continue to monitor    10. Protein calorie malnutrition: is without stable albumin 2.8 will continue supplement daily will begin prostat 30 cc twice daily   11. Radiculopathy: is stable will have him follow up with neurosurgeon on an outpatient basis.     MD is aware of resident's narcotic use and is in agreement with current plan of care. We will attempt to wean resident as appropriate.  Ok Edwards NP [facility]  Contact [phone] Monday through Friday 8am- 5pm  After hours call [phone]

## 2019-05-03 ENCOUNTER — Non-Acute Institutional Stay (SKILLED_NURSING_FACILITY): Payer: PPO | Admitting: Adult Health

## 2019-05-03 ENCOUNTER — Encounter: Payer: Self-pay | Admitting: Adult Health

## 2019-05-03 ENCOUNTER — Other Ambulatory Visit: Payer: Self-pay | Admitting: Adult Health

## 2019-05-03 DIAGNOSIS — K56609 Unspecified intestinal obstruction, unspecified as to partial versus complete obstruction: Secondary | ICD-10-CM | POA: Diagnosis not present

## 2019-05-03 DIAGNOSIS — M5416 Radiculopathy, lumbar region: Secondary | ICD-10-CM

## 2019-05-03 DIAGNOSIS — J90 Pleural effusion, not elsewhere classified: Secondary | ICD-10-CM

## 2019-05-03 DIAGNOSIS — I4891 Unspecified atrial fibrillation: Secondary | ICD-10-CM | POA: Diagnosis not present

## 2019-05-03 MED ORDER — METHIMAZOLE 10 MG PO TABS: 20.0000 mg | ORAL_TABLET | Freq: Two times a day (BID) | ORAL | 0 refills | Status: DC

## 2019-05-03 MED ORDER — FINASTERIDE 5 MG PO TABS: 5.0000 mg | ORAL_TABLET | Freq: Every day | ORAL | 0 refills | Status: DC

## 2019-05-03 MED ORDER — NITROGLYCERIN 0.4 MG SL SUBL: 0.4000 mg | SUBLINGUAL_TABLET | SUBLINGUAL | 0 refills | Status: DC | PRN

## 2019-05-03 MED ORDER — APIXABAN 5 MG PO TABS: 5.0000 mg | ORAL_TABLET | Freq: Two times a day (BID) | ORAL | 0 refills | Status: DC

## 2019-05-03 MED ORDER — TAMSULOSIN HCL 0.4 MG PO CAPS: 0.4000 mg | ORAL_CAPSULE | Freq: Every day | ORAL | 0 refills | Status: DC

## 2019-05-03 MED ORDER — BENZONATATE 200 MG PO CAPS: 200.0000 mg | ORAL_CAPSULE | Freq: Three times a day (TID) | ORAL | 0 refills | Status: DC | PRN

## 2019-05-03 MED ORDER — FUROSEMIDE 20 MG PO TABS: 40.0000 mg | ORAL_TABLET | Freq: Every day | ORAL | 0 refills | Status: DC

## 2019-05-03 MED ORDER — METOPROLOL TARTRATE 100 MG PO TABS: 50.0000 mg | ORAL_TABLET | Freq: Two times a day (BID) | ORAL | 0 refills | Status: DC

## 2019-05-03 MED ORDER — DILTIAZEM HCL ER COATED BEADS 180 MG PO CP24: 180.0000 mg | ORAL_CAPSULE | Freq: Every day | ORAL | 0 refills | Status: DC

## 2019-05-03 MED ORDER — METHOCARBAMOL 750 MG PO TABS: 750.0000 mg | ORAL_TABLET | Freq: Three times a day (TID) | ORAL | 0 refills | Status: DC | PRN

## 2019-05-03 MED ORDER — ONDANSETRON HCL 4 MG PO TABS: 4.0000 mg | ORAL_TABLET | Freq: Four times a day (QID) | ORAL | 0 refills | Status: DC | PRN

## 2019-05-03 MED ORDER — POTASSIUM CHLORIDE CRYS ER 10 MEQ PO TBCR: 10.0000 meq | EXTENDED_RELEASE_TABLET | Freq: Every evening | ORAL | 0 refills | Status: DC

## 2019-05-03 MED ORDER — PRAVASTATIN SODIUM 10 MG PO TABS: 10.0000 mg | ORAL_TABLET | Freq: Every day | ORAL | 0 refills | Status: DC

## 2019-05-03 MED ORDER — AMIODARONE HCL 200 MG PO TABS: 200.0000 mg | ORAL_TABLET | Freq: Every day | ORAL | 0 refills | Status: DC

## 2019-05-03 NOTE — Progress Notes (Signed)
Location:    [facility] Room Number: 144/P Place of Service:  SNF (31)    CODE STATUS: DNR  Allergies  Allergen Reactions  . Indomethacin Anaphylaxis    But tolerates ibuprofen, aleve  . Iodine-131 Anaphylaxis    Chief Complaint  Patient presents with  . Discharge Note    Discharge Visit    HPI:  He is being discharged to home with home health for pt/ot/cna. He will not need any dme. Will need his prescriptions written and will need to follow up with his medical provider. He has been hospitalized twice once for sbo another for uti; pleural effusion. He has lumbar radiculopathy and will need to follow up outpatient with neurology. He was admitted to this facility for short term rehab. He has participated in therapy and now ready for discharge to home.     Past Medical History:  Diagnosis Date  . Acute myocardial infarction, unspecified site, episode of care unspecified   . CAD in native artery    a. cath 01/27/2009 : s/p promus DES to LAD and medical managment of 70-80% mRCA  . Edema   . HTN (hypertension)   . Mixed hyperlipidemia   . Unspecified hypothyroidism     Past Surgical History:  Procedure Laterality Date  . CHOLECYSTECTOMY    . CORONARY STENT PLACEMENT    . CYSTOSCOPY/RETROGRADE/URETEROSCOPY Bilateral 03/09/2019   Procedure: CYSTOSCOPY, Clot Evacuation, Fulguration;  Surgeon: Festus Aloe, MD;  Location: [facility];  Service: Urology;  Laterality: Bilateral;  . HERNIA REPAIR      Social History   Socioeconomic History  . Marital status: Married    Spouse name: Not on file  . Number of children: Not on file  . Years of education: Not on file  . Highest education level: Not on file  Occupational History  . Occupation: Charity fundraiser  Tobacco Use  . Smoking status: Former Smoker    Quit date: 01/20/1957    Years since quitting: 62.3  . Smokeless tobacco: Never Used  Substance and Sexual Activity  . Alcohol use: No  . Drug use: Never   . Sexual activity: Not on file  Other Topics Concern  . Not on file  Social History Narrative   Quit smoking about 62 years ago.   Social Determinants of Health   Financial Resource Strain:   . Difficulty of Paying Living Expenses:   Food Insecurity:   . Worried About Charity fundraiser in the Last Year:   . Arboriculturist in the Last Year:   Transportation Needs:   . Film/video editor (Medical):   Marland Kitchen Lack of Transportation (Non-Medical):   Physical Activity:   . Days of Exercise per Week:   . Minutes of Exercise per Session:   Stress:   . Feeling of Stress :   Social Connections:   . Frequency of Communication with Friends and Family:   . Frequency of Social Gatherings with Friends and Family:   . Attends Religious Services:   . Active Member of Clubs or Organizations:   . Attends Archivist Meetings:   Marland Kitchen Marital Status:   Intimate Partner Violence:   . Fear of Current or Ex-Partner:   . Emotionally Abused:   Marland Kitchen Physically Abused:   . Sexually Abused:    Family History  Problem Relation Age of Onset  . Heart attack Father   . Lung cancer Sister     VITAL SIGNS BP (!) 173/65  Pulse (!) 59   Temp 98.4 F (36.9 C) (Oral)   Resp 20   Ht 6' (1.829 m)   Wt 275 lb (124.7 kg)   BMI 37.30 kg/m   Patient's Medications  New Prescriptions   No medications on file  Previous Medications   AMINO ACIDS-PROTEIN HYDROLYS (FEEDING SUPPLEMENT, PRO-STAT SUGAR FREE 64,) LIQD    Take 30 mLs by mouth 3 (three) times daily with meals.   AMIODARONE (PACERONE) 200 MG TABLET    Take 1 tablet (200 mg total) by mouth daily.   APIXABAN (ELIQUIS) 5 MG TABS TABLET    Take 1 tablet (5 mg total) by mouth 2 (two) times daily.   BALSAM PERU-CASTOR OIL (VENELEX) OINT    Apply 1 application topically in the morning, at noon, and at bedtime. Apply to buttocks and scrotum due to erythema and excoriations   BENZONATATE (TESSALON) 200 MG CAPSULE    Take 1 capsule (200 mg total) by  mouth every 8 (eight) hours as needed for cough.   BISACODYL (DULCOLAX) 10 MG SUPPOSITORY    Place 1 suppository (10 mg total) rectally daily as needed for severe constipation.   COENZYME Q10 200 MG CAPSULE    Take 200 mg by mouth daily.     DILTIAZEM (CARDIZEM CD) 180 MG 24 HR CAPSULE    Take 1 capsule (180 mg total) by mouth daily.   FINASTERIDE (PROSCAR) 5 MG TABLET    Take 1 tablet (5 mg total) by mouth daily.   FISH OIL-OMEGA-3 FATTY ACIDS 1000 MG CAPSULE    Take 1 g by mouth daily.    FUROSEMIDE (LASIX) 20 MG TABLET    Take 2 tablets (40 mg total) by mouth daily.   METHIMAZOLE (TAPAZOLE) 10 MG TABLET    Take 2 tablets (20 mg total) by mouth 2 (two) times daily.   METHOCARBAMOL (ROBAXIN) 750 MG TABLET    Take 1 tablet (750 mg total) by mouth every 8 (eight) hours as needed for muscle spasms.   METOPROLOL TARTRATE (LOPRESSOR) 100 MG TABLET    Take 0.5 tablets (50 mg total) by mouth 2 (two) times daily.   MULTIPLE VITAMIN (MULTIVITAMIN) TABLET    Take 1 tablet by mouth daily.     NITROGLYCERIN (NITROSTAT) 0.4 MG SL TABLET    Place 1 tablet (0.4 mg total) under the tongue every 5 (five) minutes as needed.   NON FORMULARY    Diet: _____ Regular, ___x___ NAS, _______Consistent Carbohydrate, _______NPO _____Other   NUTRITIONAL SUPPLEMENTS (ENSURE CLEAR) LIQD    Take 237 mLs by mouth daily.   ONDANSETRON (ZOFRAN) 4 MG TABLET    Take 1 tablet (4 mg total) by mouth every 6 (six) hours as needed for nausea or vomiting.   POLYETHYLENE GLYCOL (MIRALAX / GLYCOLAX) 17 G PACKET    Take 17 g by mouth daily as needed for mild constipation.   POTASSIUM CHLORIDE (KLOR-CON) 10 MEQ TABLET    Take 1 tablet (10 mEq total) by mouth every evening.   PRAVASTATIN (PRAVACHOL) 10 MG TABLET    Take 1 tablet (10 mg total) by mouth daily at 6 PM.   SENNA-DOCUSATE (SENOKOT-S) 8.6-50 MG TABLET    Take 1 tablet by mouth 2 (two) times daily.   TAMSULOSIN (FLOMAX) 0.4 MG CAPS CAPSULE    Take 1 capsule (0.4 mg total) by mouth  daily after breakfast.  Modified Medications   No medications on file  Discontinued Medications   No medications on file  SIGNIFICANT DIAGNOSTIC EXAMS   PREVIOUS  02-13-19: 2-d echo:  1. Left ventricular ejection fraction, by visual estimation, is 60 to  65%. The left ventricle has normal function. There is moderately increased  left ventricular hypertrophy.   02-27-19: renal ultrasound:  1. No evidence of hydronephrosis involving either kidney to suggest urinary tract obstruction. 2. Mild diffuse cortical thinning involving both kidneys. No focal abnormality involving either kidney and no visible shadowing calculi.  04-11-19: chest x-ray: 1. Left lung base atelectasis versus infiltrate. A small left pleural effusion may be present. 2. Stable cardiomegaly.  04-12-19: lumbar spine MRI:  1. No acute abnormality 2. Multilevel disc and facet degeneration causing mild stenosis as described above  NO NEWS EXAMS.   LABS REVIEWED PREVOIUS  02-11-19: wbc 12.9; hgb 13.8; hct 42.5; mcv 92.6 plt 192; glucose 131; bun 43; creat 1.71; k+ 4.7; na++ 145; ca 8.8; liver normal albumin 3.4 tsh 0.026 02-12-19: free t3: 3.4 free t4: 1.76 02-14-19: digoxin 0.9 02-23-19: tsh 0.056 free t3: 2.15 03-02-19: glucose 116; bun 22; creat 1.41; k+ 3.8; na++ 131; ca 7.9; liver normal albumin 1.8 d-dimer 2.3 03-12-19: wbc 7.3; hgb 7.9; hct 24.9; mcv 92.9 plt 218; glucose 100; bun 20; creat 1.70; k+ 3.7; na++ 138; ca 8.1  04-11-19: wbc 8.1; hgb 8.8; hct 28.4; mcv 94.4 plt 230; glucose 104; bun 15; creat 1.27; k+ 4.0; na++ 139; ca 8.0; liver normal albumin 2.8 BNP 243.7; blood culture: no growth; urine culture: proteus mirabilis  04-13-19: wbc 6.8; hgb 8.9; hct 28.9; mcv 94.4 plt 221; glucose 121; bun 18; creat 1.23; k+ 3.6; na++ 140; ca 8.2; mag 2.0 04-14-19: glucose 105; bun 17; creat 0.96; k+ 3.6; na++ 138; ca 8.0    NO NEW LABS.   Review of Systems  Constitutional: Negative for malaise/fatigue.  Respiratory:  Negative for cough and shortness of breath.   Cardiovascular: Negative for chest pain, palpitations and leg swelling.  Gastrointestinal: Negative for abdominal pain, constipation and heartburn.  Musculoskeletal: Negative for back pain, joint pain and myalgias.  Skin: Negative.   Neurological: Negative for dizziness.  Psychiatric/Behavioral: The patient is not nervous/anxious.    Physical Exam Constitutional:      General: He is not in acute distress.    Appearance: He is well-developed. He is not diaphoretic.  Neck:     Thyroid: No thyromegaly.  Cardiovascular:     Rate and Rhythm: Normal rate. Rhythm irregular.     Heart sounds: Normal heart sounds.  Pulmonary:     Effort: Pulmonary effort is normal. No respiratory distress.     Breath sounds: Normal breath sounds.  Abdominal:     General: Bowel sounds are normal. There is no distension.     Palpations: Abdomen is soft.     Tenderness: There is no abdominal tenderness.  Genitourinary:    Comments: foley Musculoskeletal:        General: Normal range of motion.     Cervical back: Neck supple.     Right lower leg: No edema.     Left lower leg: No edema.  Lymphadenopathy:     Cervical: No cervical adenopathy.  Skin:    General: Skin is warm and dry.  Neurological:     Mental Status: He is alert and oriented to person, place, and time.  Psychiatric:        Mood and Affect: Mood normal.       ASSESSMENT/ PLAN:   Patient is being discharged with the following home health  services:  Pt/ot/cna to evaluate and treat as indicated for gait balance strength adl training and adl care.   Patient is being discharged with the following durable medical equipment:  None needed   Patient has been advised to f/u with their PCP in 1-2 weeks to bring them up to date on their rehab stay.  Social services at facility was responsible for arranging this appointment.  Pt was provided with a 30 day supply of prescriptions for medications and  refills must be obtained from their PCP.  For controlled substances, a more limited supply may be provided adequate until PCP appointment only.  A 30 day supply of his prescription medications have been sent to [facility]  Time spent with patient 35 minutes: dme; home health medications.    Ok Edwards NP [facility]  Contact [phone] Monday through Friday 8am- 5pm  After hours call [phone]

## 2019-05-05 DIAGNOSIS — E785 Hyperlipidemia, unspecified: Secondary | ICD-10-CM | POA: Diagnosis not present

## 2019-05-05 DIAGNOSIS — Z466 Encounter for fitting and adjustment of urinary device: Secondary | ICD-10-CM | POA: Diagnosis not present

## 2019-05-05 DIAGNOSIS — E039 Hypothyroidism, unspecified: Secondary | ICD-10-CM | POA: Diagnosis not present

## 2019-05-05 DIAGNOSIS — N400 Enlarged prostate without lower urinary tract symptoms: Secondary | ICD-10-CM | POA: Diagnosis not present

## 2019-05-05 DIAGNOSIS — I2510 Atherosclerotic heart disease of native coronary artery without angina pectoris: Secondary | ICD-10-CM | POA: Diagnosis not present

## 2019-05-05 DIAGNOSIS — I10 Essential (primary) hypertension: Secondary | ICD-10-CM | POA: Diagnosis not present

## 2019-05-05 DIAGNOSIS — Z48816 Encounter for surgical aftercare following surgery on the genitourinary system: Secondary | ICD-10-CM | POA: Diagnosis not present

## 2019-05-05 DIAGNOSIS — I480 Paroxysmal atrial fibrillation: Secondary | ICD-10-CM | POA: Diagnosis not present

## 2019-05-05 DIAGNOSIS — Z8616 Personal history of COVID-19: Secondary | ICD-10-CM | POA: Diagnosis not present

## 2019-05-05 DIAGNOSIS — E46 Unspecified protein-calorie malnutrition: Secondary | ICD-10-CM | POA: Diagnosis not present

## 2019-05-05 DIAGNOSIS — Z7901 Long term (current) use of anticoagulants: Secondary | ICD-10-CM | POA: Diagnosis not present

## 2019-05-05 DIAGNOSIS — K219 Gastro-esophageal reflux disease without esophagitis: Secondary | ICD-10-CM | POA: Diagnosis not present

## 2019-05-10 DIAGNOSIS — I10 Essential (primary) hypertension: Secondary | ICD-10-CM | POA: Diagnosis not present

## 2019-05-10 DIAGNOSIS — E782 Mixed hyperlipidemia: Secondary | ICD-10-CM | POA: Diagnosis not present

## 2019-05-10 DIAGNOSIS — R972 Elevated prostate specific antigen [PSA]: Secondary | ICD-10-CM | POA: Diagnosis not present

## 2019-05-10 DIAGNOSIS — R7301 Impaired fasting glucose: Secondary | ICD-10-CM | POA: Diagnosis not present

## 2019-05-10 DIAGNOSIS — E0590 Thyrotoxicosis, unspecified without thyrotoxic crisis or storm: Secondary | ICD-10-CM | POA: Diagnosis not present

## 2019-05-10 DIAGNOSIS — D649 Anemia, unspecified: Secondary | ICD-10-CM | POA: Diagnosis not present

## 2019-05-13 ENCOUNTER — Ambulatory Visit: Payer: PPO | Admitting: Cardiovascular Disease

## 2019-05-13 DIAGNOSIS — Z48816 Encounter for surgical aftercare following surgery on the genitourinary system: Secondary | ICD-10-CM | POA: Diagnosis not present

## 2019-05-19 ENCOUNTER — Other Ambulatory Visit: Payer: Self-pay

## 2019-05-19 ENCOUNTER — Encounter (HOSPITAL_COMMUNITY): Payer: Self-pay | Admitting: *Deleted

## 2019-05-19 ENCOUNTER — Emergency Department (HOSPITAL_COMMUNITY): Payer: PPO

## 2019-05-19 ENCOUNTER — Telehealth: Payer: PPO | Admitting: Cardiovascular Disease

## 2019-05-19 ENCOUNTER — Emergency Department (HOSPITAL_COMMUNITY)
Admission: EM | Admit: 2019-05-19 | Discharge: 2019-05-19 | Disposition: A | Discharge status: Home / Self Care | Payer: PPO | Attending: Emergency Medicine | Admitting: Emergency Medicine

## 2019-05-19 DIAGNOSIS — Z7901 Long term (current) use of anticoagulants: Secondary | ICD-10-CM | POA: Diagnosis not present

## 2019-05-19 DIAGNOSIS — R319 Hematuria, unspecified: Secondary | ICD-10-CM | POA: Insufficient documentation

## 2019-05-19 DIAGNOSIS — E039 Hypothyroidism, unspecified: Secondary | ICD-10-CM | POA: Insufficient documentation

## 2019-05-19 DIAGNOSIS — I4891 Unspecified atrial fibrillation: Secondary | ICD-10-CM | POA: Diagnosis not present

## 2019-05-19 DIAGNOSIS — Y929 Unspecified place or not applicable: Secondary | ICD-10-CM | POA: Insufficient documentation

## 2019-05-19 DIAGNOSIS — I10 Essential (primary) hypertension: Secondary | ICD-10-CM | POA: Diagnosis not present

## 2019-05-19 DIAGNOSIS — S79911A Unspecified injury of right hip, initial encounter: Secondary | ICD-10-CM | POA: Diagnosis not present

## 2019-05-19 DIAGNOSIS — M25551 Pain in right hip: Secondary | ICD-10-CM | POA: Diagnosis not present

## 2019-05-19 DIAGNOSIS — R519 Headache, unspecified: Secondary | ICD-10-CM | POA: Diagnosis not present

## 2019-05-19 DIAGNOSIS — R338 Other retention of urine: Secondary | ICD-10-CM | POA: Diagnosis not present

## 2019-05-19 DIAGNOSIS — W19XXXA Unspecified fall, initial encounter: Secondary | ICD-10-CM | POA: Insufficient documentation

## 2019-05-19 DIAGNOSIS — Y999 Unspecified external cause status: Secondary | ICD-10-CM | POA: Diagnosis not present

## 2019-05-19 DIAGNOSIS — Y939 Activity, unspecified: Secondary | ICD-10-CM | POA: Diagnosis not present

## 2019-05-19 DIAGNOSIS — Z79899 Other long term (current) drug therapy: Secondary | ICD-10-CM | POA: Insufficient documentation

## 2019-05-19 DIAGNOSIS — S0001XA Abrasion of scalp, initial encounter: Secondary | ICD-10-CM | POA: Insufficient documentation

## 2019-05-19 DIAGNOSIS — Z87891 Personal history of nicotine dependence: Secondary | ICD-10-CM | POA: Diagnosis not present

## 2019-05-19 DIAGNOSIS — S199XXA Unspecified injury of neck, initial encounter: Secondary | ICD-10-CM | POA: Diagnosis not present

## 2019-05-19 DIAGNOSIS — R310 Gross hematuria: Secondary | ICD-10-CM | POA: Diagnosis not present

## 2019-05-19 DIAGNOSIS — S0990XA Unspecified injury of head, initial encounter: Secondary | ICD-10-CM | POA: Diagnosis present

## 2019-05-19 HISTORY — DX: COVID-19: U07.1

## 2019-05-19 LAB — URINALYSIS, ROUTINE W REFLEX MICROSCOPIC

## 2019-05-19 LAB — URINALYSIS, MICROSCOPIC (REFLEX): RBC / HPF: >50 RBC/hpf (ref 0–5)

## 2019-05-19 NOTE — Discharge Instructions (Signed)
A culture was sent of your urine today to determine if there is any bacterial growth. If the results of the culture are positive and you require an antibiotic or a change of your prescribed antibiotic you will be contacted by the hospital. If the results are negative you will not be contacted.  Call your urologist tomorrow to schedule an appointment for follow up .  Please return to the emergency department for any new or worsening symptoms.

## 2019-05-19 NOTE — ED Notes (Signed)
Assessed bladder catheter. Took Saline out of the balloon it was 52ml. The recommended amount of ml's is 5-15 ml. Placed 9 mls of normal saline in the balloon.

## 2019-05-19 NOTE — ED Provider Notes (Signed)
[facility] Provider Note   CSN: [id number] Arrival date & time: 05/19/19  1631     History Chief Complaint  Patient presents with  . Foley Cath issue    Derek Foster is a 84 y.o. male.  HPI   Patient is an 84 year old male with a history of MI, CAD, hypertension, hyperlipidemia, atrial fibrillation, who presents to the emergency department today who presents to the emergency department today for evaluation of hematuria and mechanical fall.  Patient states he had an appointment with his urologist this morning and had his Foley catheter changed after it had been in for 4 weeks.  After getting home from the appointment he got out of his car and was using his walker.  When he pivoted he lost his balance and fell onto his right hip.  Since then he has had some soreness in the right hip but has been able to ambulate.  He also states that when he fell he hit his head.  He is anticoagulated on Eliquis.  He denies any other symptoms.  Denies headache, lightheadedness, dizziness, visual changes, or any new unilateral numbness/weakness.  Later on in the day he noticed that he was having bleeding around his catheter site and hematuria.  He denies any suprapubic pain or other symptoms related to this.  Denies fevers.  Past Medical History:  Diagnosis Date  . Acute myocardial infarction, unspecified site, episode of care unspecified   . CAD in native artery    a. cath 01/27/2009 : s/p promus DES to LAD and medical managment of 70-80% mRCA  . COVID-19   . Edema   . HTN (hypertension)   . Mixed hyperlipidemia   . Unspecified hypothyroidism     Patient Active Problem List   Diagnosis Date Noted  . HCAP (healthcare-associated pneumonia) 04/11/2019  . UTI (urinary tract infection) due to urinary indwelling catheter ([place]) 04/11/2019  . Pleural effusion 04/11/2019  . Radiculopathy 04/11/2019  . PAF (paroxysmal atrial fibrillation) ([place]) 04/11/2019  . Anticoagulated  04/07/2019  . BPH (benign prostatic hyperplasia) 03/28/2019  . GERD without esophagitis 03/28/2019  . Protein-calorie malnutrition, severe ([place]) 03/28/2019  . Klebsiella cystitis 03/18/2019  . Urinary tract infection due to Proteus 03/18/2019  . Palliative care by specialist   . DNR (do not resuscitate)   . Weakness generalized   . Grief   . Gross hematuria   . AKI (acute kidney injury) ([place]) 02/12/2019  . SBO (small bowel obstruction) ([place]) 02/12/2019  . CAD S/P percutaneous coronary angioplasty 02/11/2019  . Atrial fibrillation with RVR ([place]) 02/11/2019  . Mixed hyperlipidemia 09/10/2009  . EDEMA 02/27/2009  . Hyperthyroidism 02/26/2009  . Essential hypertension 02/26/2009  . MYOCARDIAL INFARCTION 02/26/2009    Past Surgical History:  Procedure Laterality Date  . CHOLECYSTECTOMY    . CORONARY STENT PLACEMENT    . CYSTOSCOPY/RETROGRADE/URETEROSCOPY Bilateral 03/09/2019   Procedure: CYSTOSCOPY, Clot Evacuation, Fulguration;  Surgeon: Festus Aloe, MD;  Location: [facility];  Service: Urology;  Laterality: Bilateral;  . HERNIA REPAIR         Family History  Problem Relation Age of Onset  . Heart attack Father   . Lung cancer Sister     Social History   Tobacco Use  . Smoking status: Former Smoker    Quit date: 01/20/1957    Years since quitting: 62.3  . Smokeless tobacco: Never Used  Substance Use Topics  . Alcohol use: No  . Drug use: Never    Home Medications  Prior to Admission medications   Medication Sig Start Date End Date Taking? Authorizing Provider  Amino Acids-Protein Hydrolys (FEEDING SUPPLEMENT, PRO-STAT SUGAR FREE 64,) LIQD Take 30 mLs by mouth 3 (three) times daily with meals.    [provider]  amiodarone (PACERONE) 200 MG tablet Take 1 tablet (200 mg total) by mouth daily. 05/03/19   Gerlene Fee, NP  apixaban (ELIQUIS) 5 MG TABS tablet Take 1 tablet (5 mg total) by mouth 2 (two) times daily. 05/03/19   Gerlene Fee, NP  Balsam  [place]-Castor Oil [facility]) OINT Apply 1 application topically in the morning, at noon, and at bedtime. Apply to buttocks and scrotum due to erythema and excoriations    [provider]  benzonatate (TESSALON) 200 MG capsule Take 1 capsule (200 mg total) by mouth every 8 (eight) hours as needed for cough. 05/03/19   Gerlene Fee, NP  bisacodyl (DULCOLAX) 10 MG suppository Place 1 suppository (10 mg total) rectally daily as needed for severe constipation. 03/12/19   Vashti Hey, MD  Coenzyme Q10 200 MG capsule Take 200 mg by mouth daily.      [provider]  diltiazem (CARDIZEM CD) 180 MG 24 hr capsule Take 1 capsule (180 mg total) by mouth daily. 05/03/19   Gerlene Fee, NP  finasteride (PROSCAR) 5 MG tablet Take 1 tablet (5 mg total) by mouth daily. 05/03/19   Gerlene Fee, NP  fish oil-omega-3 fatty acids 1000 MG capsule Take 1 g by mouth daily.     [provider]  furosemide (LASIX) 20 MG tablet Take 2 tablets (40 mg total) by mouth daily. 05/03/19   Gerlene Fee, NP  methimazole (TAPAZOLE) 10 MG tablet Take 2 tablets (20 mg total) by mouth 2 (two) times daily. 05/03/19   Gerlene Fee, NP  methocarbamol (ROBAXIN) 750 MG tablet Take 1 tablet (750 mg total) by mouth every 8 (eight) hours as needed for muscle spasms. 05/03/19   Gerlene Fee, NP  metoprolol tartrate (LOPRESSOR) 100 MG tablet Take 0.5 tablets (50 mg total) by mouth 2 (two) times daily. 05/03/19   Gerlene Fee, NP  Multiple Vitamin (MULTIVITAMIN) tablet Take 1 tablet by mouth daily.      [provider]  nitroGLYCERIN (NITROSTAT) 0.4 MG SL tablet Place 1 tablet (0.4 mg total) under the tongue every 5 (five) minutes as needed. 05/03/19   Gerlene Fee, NP  NON FORMULARY Diet: _____ Regular, ___x___ NAS, _______Consistent Carbohydrate, _______NPO _____Other    [provider]  Nutritional Supplements (ENSURE CLEAR) LIQD Take 237 mLs by mouth daily.     [provider]  ondansetron (ZOFRAN) 4 MG tablet Take 1 tablet (4 mg total) by mouth every 6 (six) hours as needed for nausea or vomiting. 05/03/19   Gerlene Fee, NP  polyethylene glycol (MIRALAX / GLYCOLAX) 17 g packet Take 17 g by mouth daily as needed for mild constipation. 03/12/19   Vashti Hey, MD  potassium chloride (KLOR-CON) 10 MEQ tablet Take 1 tablet (10 mEq total) by mouth every evening. 05/03/19   Gerlene Fee, NP  pravastatin (PRAVACHOL) 10 MG tablet Take 1 tablet (10 mg total) by mouth daily at 6 PM. 05/03/19   Green, Phylis Bougie, NP  senna-docusate (SENOKOT-S) 8.6-50 MG tablet Take 1 tablet by mouth 2 (two) times daily. 03/12/19   Vashti Hey, MD  tamsulosin (FLOMAX) 0.4 MG CAPS capsule Take 1 capsule (0.4 mg total) by  mouth daily after breakfast. 05/03/19   Gerlene Fee, NP  simvastatin (ZOCOR) 20 MG tablet Take 1 tablet (20 mg total) by mouth every evening. 02/25/11 04/02/11  Josue Hector, MD    Allergies    Indomethacin and Iodine-131  Review of Systems   Review of Systems  Constitutional: Negative for fever.  HENT: Negative for sore throat.   Eyes: Negative for visual disturbance.  Respiratory: Negative for cough and shortness of breath.   Cardiovascular: Negative for chest pain.  Gastrointestinal: Negative for abdominal pain and vomiting.  Genitourinary: Positive for hematuria. Negative for dysuria.  Musculoskeletal: Negative for back pain.       Right hip pain  Skin: Negative for rash.  Neurological: Negative for headaches.       Head injury, no loc  All other systems reviewed and are negative.   Physical Exam Updated Vital Signs BP (!) 173/136 (BP Location: Left Arm)   Pulse 61   Temp 98.1 F (36.7 C) (Oral)   Resp 18   Ht 6' (1.829 m)   Wt 121.1 kg   SpO2 100%   BMI 36.21 kg/m   Physical Exam Vitals and nursing note reviewed.  Constitutional:      Appearance: He is well-developed.  HENT:     Head:  Normocephalic.     Comments: Superficial abrasion to the right parietal scalp Eyes:     Conjunctiva/sclera: Conjunctivae normal.  Cardiovascular:     Rate and Rhythm: Normal rate and regular rhythm.     Heart sounds: No murmur.  Pulmonary:     Effort: Pulmonary effort is normal. No respiratory distress.     Breath sounds: Normal breath sounds.  Abdominal:     Palpations: Abdomen is soft.     Tenderness: There is no abdominal tenderness.  Musculoskeletal:     Cervical back: Neck supple.     Comments: No significant reproducible TTP to the bilat hips. No TTP to the cervical, thoracic, or lumbar spine.   Skin:    General: Skin is warm and dry.  Neurological:     Mental Status: He is alert.     Comments: Mental Status:  Alert, thought content appropriate, able to give a coherent history. Speech fluent without evidence of aphasia. Able to follow 2 step commands without difficulty.  Cranial Nerves:  II: pupils equal, round, reactive to light III,IV, VI: ptosis not present, extra-ocular motions intact bilaterally  V,VII: smile symmetric, facial light touch sensation equal VIII: hearing grossly normal to voice  X: uvula elevates symmetrically  XI: bilateral shoulder shrug symmetric and strong XII: midline tongue extension without fassiculations Motor:  Normal tone. 5/5 strength of BUE and BLE major muscle groups including strong and equal grip strength and dorsiflexion/plantar flexion Sensory: light touch normal in all extremities.     ED Results / Procedures / Treatments   Labs (all labs ordered are listed, but only abnormal results are displayed) Labs Reviewed  URINALYSIS, ROUTINE W REFLEX MICROSCOPIC - Abnormal; Notable for the following components:      Result Value   Color, Urine RED (*)    APPearance TURBID (*)    Glucose, UA   (*)    Value: TEST NOT REPORTED DUE TO COLOR INTERFERENCE OF URINE PIGMENT   Hgb urine dipstick   (*)    Value: TEST NOT REPORTED DUE TO COLOR  INTERFERENCE OF URINE PIGMENT   Bilirubin Urine   (*)    Value: TEST NOT REPORTED DUE TO COLOR  INTERFERENCE OF URINE PIGMENT   Ketones, ur   (*)    Value: TEST NOT REPORTED DUE TO COLOR INTERFERENCE OF URINE PIGMENT   Protein, ur   (*)    Value: TEST NOT REPORTED DUE TO COLOR INTERFERENCE OF URINE PIGMENT   Nitrite   (*)    Value: TEST NOT REPORTED DUE TO COLOR INTERFERENCE OF URINE PIGMENT   Leukocytes,Ua   (*)    Value: TEST NOT REPORTED DUE TO COLOR INTERFERENCE OF URINE PIGMENT   All other components within normal limits  URINALYSIS, MICROSCOPIC (REFLEX) - Abnormal; Notable for the following components:   Bacteria, UA MANY (*)    Non Squamous Epithelial PRESENT (*)    All other components within normal limits  URINE CULTURE    EKG None  Radiology CT Head Wo Contrast  Result Date: 05/19/2019 CLINICAL DATA:  Headache EXAM: CT HEAD WITHOUT CONTRAST TECHNIQUE: Contiguous axial images were obtained from the base of the skull through the vertex without intravenous contrast. COMPARISON:  CT brain 04/12/2003 FINDINGS: Brain: No acute territorial infarction, hemorrhage or intracranial mass. Moderate atrophy. Mild to moderate hypodensity in the white matter consistent with chronic small vessel ischemic change. Mildly prominent ventricles felt secondary to atrophy. Vascular: No hyperdense vessels.  Carotid vascular calcification Skull: Normal. Negative for fracture or focal lesion. Sinuses/Orbits: Mild mucosal thickening in the maxillary sinuses Other: None IMPRESSION: 1. No CT evidence for acute intracranial abnormality. 2. Atrophy and chronic small vessel ischemic change of the white matter. Electronically Signed   By: Donavan Foil M.D.   On: 05/19/2019 21:49   CT Cervical Spine Wo Contrast  Result Date: 05/19/2019 CLINICAL DATA:  Fall EXAM: CT CERVICAL SPINE WITHOUT CONTRAST TECHNIQUE: Multidetector CT imaging of the cervical spine was performed without intravenous contrast. Multiplanar CT  image reconstructions were also generated. COMPARISON:  Thyroid ultrasound 04/21/2016. FINDINGS: Alignment: No subluxation.  Facet alignment is maintained Skull base and vertebrae: No acute fracture. No primary bone lesion or focal pathologic process. Soft tissues and spinal canal: No prevertebral fluid or swelling. No visible canal hematoma. Disc levels: Diffuse degenerative changes, most advanced at C4-C5 and C5-C6. Posterior disc osteophyte at C5-C6 with mild mass effect on the thecal sac. Facet degenerative change at multiple levels. Upper chest: Diffusely enlarged thyroid gland with multiple masses. This has been previously evaluated with thyroid ultrasound and biopsy, no further workup recommended based on CT. Correlation with the previous thyroid ultrasound and biopsy reports is advised. Other: None IMPRESSION: No acute osseous abnormality. Degenerative changes of the cervical spine. Electronically Signed   By: Donavan Foil M.D.   On: 05/19/2019 21:47   DG Hip Unilat W or Wo Pelvis 2-3 Views Right  Result Date: 05/19/2019 CLINICAL DATA:  Fall with hip pain EXAM: DG HIP (WITH OR WITHOUT PELVIS) 2-3V RIGHT COMPARISON:  None. FINDINGS: No definite acute displaced fracture or malalignment. SI joint degenerative change. Pubic symphysis and rami appear intact. IMPRESSION: No acute osseous abnormality. Electronically Signed   By: Donavan Foil M.D.   On: 05/19/2019 18:12    Procedures Procedures (including critical care time)  Medications Ordered in ED Medications - No data to display  ED Course  I have reviewed the triage vital signs and the nursing notes.  Pertinent labs & imaging results that were available during my care of the patient were reviewed by me and considered in my medical decision making (see chart for details).    MDM Rules/Calculators/A&P  84 year old male presenting for evaluation of hematuria and mechanical fall.  Had a urology appointment today and had  Foley catheter changed.  Following this he had a mechanical fall hurting his right hip and hitting his head.  He later noticed hematuria.  UA unable to be interpreted due to bleeding. Culture was sent. I will hold on abx at this time as I suspect that his hematuria is likely post traumatic from his fall. His urine has drained clearer since the foley was was initially emptied in the ED.   Xray right hip neg for fx. - he has been able to ambulate at home and declines pain meds here  CT head - with chronic atrophy and chronic small vessel ischemic changes but no acute intracranial abnormality CT cervical spine - no acute osseous abnormality. Degenerative changes of the cervical spine.  Pt w/ reassuring w/u today. Will have him call his urologist tomorrow to schedule appt. Advised on return precautions. He voices understanding of the plan and reasons to return. All questions answered, pt stable for d/c.    Final Clinical Impression(s) / ED Diagnoses Final diagnoses:  Hematuria, unspecified type  Fall, initial encounter    Rx / DC Orders ED Discharge Orders    None       Bishop Dublin 05/19/19 2155    Milton Ferguson, MD 05/20/19 [phone]

## 2019-05-19 NOTE — ED Triage Notes (Signed)
Pt states he had a urinary catheter placed this am and he is now having blood coming out from around catheter. Pt had his catheter replaced this am due to the previous one being in for 4 weeks; pt denies any trouble with placing the catheter this am; pt states he also fell today and is having right hip pain

## 2019-05-20 ENCOUNTER — Encounter: Payer: Self-pay | Admitting: Cardiology

## 2019-05-20 ENCOUNTER — Telehealth (INDEPENDENT_AMBULATORY_CARE_PROVIDER_SITE_OTHER): Payer: PPO | Admitting: Cardiology

## 2019-05-20 VITALS — Ht 72.0 in | Wt 267.0 lb

## 2019-05-20 DIAGNOSIS — N390 Urinary tract infection, site not specified: Secondary | ICD-10-CM

## 2019-05-20 DIAGNOSIS — T83511D Infection and inflammatory reaction due to indwelling urethral catheter, subsequent encounter: Secondary | ICD-10-CM | POA: Diagnosis not present

## 2019-05-20 DIAGNOSIS — I10 Essential (primary) hypertension: Secondary | ICD-10-CM

## 2019-05-20 DIAGNOSIS — Z9861 Coronary angioplasty status: Secondary | ICD-10-CM

## 2019-05-20 DIAGNOSIS — I480 Paroxysmal atrial fibrillation: Secondary | ICD-10-CM | POA: Diagnosis not present

## 2019-05-20 DIAGNOSIS — I2510 Atherosclerotic heart disease of native coronary artery without angina pectoris: Secondary | ICD-10-CM | POA: Diagnosis not present

## 2019-05-20 DIAGNOSIS — Z7901 Long term (current) use of anticoagulants: Secondary | ICD-10-CM | POA: Diagnosis not present

## 2019-05-20 DIAGNOSIS — E0590 Thyrotoxicosis, unspecified without thyrotoxic crisis or storm: Secondary | ICD-10-CM

## 2019-05-20 NOTE — Progress Notes (Signed)
Virtual Visit via Telephone Note   This visit type was conducted due to national recommendations for restrictions regarding the COVID-19 Pandemic (e.g. social distancing) in an effort to limit this patient's exposure and mitigate transmission in our community.  Due to his co-morbid illnesses, this patient is at least at moderate risk for complications without adequate follow up.  This format is felt to be most appropriate for this patient at this time.  The patient did not have access to video technology/had technical difficulties with video requiring transitioning to audio format only (telephone).  All issues noted in this document were discussed and addressed.  No physical exam could be performed with this format.  Please refer to the patient's chart for his  consent to telehealth for [facility]   The patient was identified using 2 identifiers.  Date:  05/20/2019   ID:  [name], DOB 07/24/1935, MRN [id number]  Patient Location: Home Provider Location: Home  PCP:  Celene Squibb, MD  Cardiologist:  Skeet Latch, MD  Electrophysiologist:  None   Evaluation Performed:  Follow-Up Visit  Chief Complaint:  none  History of Present Illness:    Derek Foster is a pleasant 84 y.o. male with a history of CAD. He had an anterior MI in 2014 treated with an LAD PCI. Other past medical history is remarkable for HTN and dyslipidemia. In January 2021 he was admitted with a small bowel obstruction and new onset atrial fibrillation in the setting of active COVID-19. He had recently lost his wife to COVID-19 prior to that admission. Cardiology was consulted. He required multiple medications for rate control and it turns out he was hyperthyroid. Echocardiogram during that admission revealed an ejection fraction of 60 to 65% with moderate LVH. He was discharged on amiodarone, Lanoxin, metoprolol 200 mg twice daily, and diltiazem 360 mg daily. He was also placed on Eliquis. He did have  acute renal insufficiency and gross hematuria during that hospitalization but that improved by discharge. He is being followed by Dr Claudia Desanctis with [facility]. Dr Oval Linsey saw the patient in follow up in 03/16/2019 and decreased his Amiodarone to 200 mg daily. He was discharged to SNF then but eventually was able to return home.  I saw him as a virtual f/u 04/07/2019.  His heart rate was occasionally in the 50's and I decreased his Metoprolol to 100 mg BID and stopped his Lanoxin. The plan was for two week f/u but he ended up back in the ED 04/11/2019 with multiple complaints.  He was diagnosed with M/S back pain, UTI, and suspected pneumonia. He was discharged back to [facility] 04/14/2019.  His EKG 04/14/2019 showed NSR 60.  I saw him as a virtual visit 04/30/2019. I I cut his Diltiazem back to 180 mg daily. Since then he has done well from a cardiac standpoint.  Yesterday he had to go to the ED after he fell.  Fortunately his CT and XR were negative.  He did have some hematuria and is to see Dr Claudia Desanctis in follow up. He tells me he is unaware of his AF.  Dr Nevada Crane is managing his thyroid Rx.   The patient does not have symptoms concerning for COVID-19 infection (fever, chills, cough, or new shortness of breath).    Past Medical History:  Diagnosis Date  . Acute myocardial infarction, unspecified site, episode of care unspecified   . CAD in native artery    a. cath 01/27/2009 : s/p promus DES to LAD  and medical managment of 70-80% mRCA  . COVID-19   . Edema   . HTN (hypertension)   . Mixed hyperlipidemia   . Unspecified hypothyroidism    Past Surgical History:  Procedure Laterality Date  . CHOLECYSTECTOMY    . CORONARY STENT PLACEMENT    . CYSTOSCOPY/RETROGRADE/URETEROSCOPY Bilateral 03/09/2019   Procedure: CYSTOSCOPY, Clot Evacuation, Fulguration;  Surgeon: Festus Aloe, MD;  Location: [place];  Service: Urology;  Laterality: Bilateral;  . HERNIA REPAIR       Current Meds  Medication Sig  . Amino  Acids-Protein Hydrolys (FEEDING SUPPLEMENT, PRO-STAT SUGAR FREE 64,) LIQD Take 30 mLs by mouth 3 (three) times daily with meals.  Marland Kitchen amiodarone (PACERONE) 200 MG tablet Take 1 tablet (200 mg total) by mouth daily.  Marland Kitchen apixaban (ELIQUIS) 5 MG TABS tablet Take 1 tablet (5 mg total) by mouth 2 (two) times daily.  Roseanne Kaufman [place]-Castor Oil (VENELEX) OINT Apply 1 application topically in the morning, at noon, and at bedtime. Apply to buttocks and scrotum due to erythema and excoriations  . benzonatate (TESSALON) 200 MG capsule Take 1 capsule (200 mg total) by mouth every 8 (eight) hours as needed for cough.  . bisacodyl (DULCOLAX) 10 MG suppository Place 1 suppository (10 mg total) rectally daily as needed for severe constipation.  . Coenzyme Q10 200 MG capsule Take 200 mg by mouth daily.    Marland Kitchen diltiazem (CARDIZEM CD) 180 MG 24 hr capsule Take 1 capsule (180 mg total) by mouth daily.  . finasteride (PROSCAR) 5 MG tablet Take 1 tablet (5 mg total) by mouth daily.  . fish oil-omega-3 fatty acids 1000 MG capsule Take 1 g by mouth daily.   . furosemide (LASIX) 20 MG tablet Take 2 tablets (40 mg total) by mouth daily.  . methimazole (TAPAZOLE) 10 MG tablet Take 2 tablets (20 mg total) by mouth 2 (two) times daily.  . methocarbamol (ROBAXIN) 750 MG tablet Take 1 tablet (750 mg total) by mouth every 8 (eight) hours as needed for muscle spasms.  . metoprolol tartrate (LOPRESSOR) 100 MG tablet Take 0.5 tablets (50 mg total) by mouth 2 (two) times daily.  . Multiple Vitamin (MULTIVITAMIN) tablet Take 1 tablet by mouth daily.    . nitroGLYCERIN (NITROSTAT) 0.4 MG SL tablet Place 1 tablet (0.4 mg total) under the tongue every 5 (five) minutes as needed.  . NON FORMULARY Diet: _____ Regular, ___x___ NAS, _______Consistent Carbohydrate, _______NPO _____Other  . Nutritional Supplements (ENSURE CLEAR) LIQD Take 237 mLs by mouth daily.  . ondansetron (ZOFRAN) 4 MG tablet Take 1 tablet (4 mg total) by mouth every 6 (six)  hours as needed for nausea or vomiting.  . polyethylene glycol (MIRALAX / GLYCOLAX) 17 g packet Take 17 g by mouth daily as needed for mild constipation.  . potassium chloride (KLOR-CON) 10 MEQ tablet Take 1 tablet (10 mEq total) by mouth every evening.  . pravastatin (PRAVACHOL) 10 MG tablet Take 1 tablet (10 mg total) by mouth daily at 6 PM.  . senna-docusate (SENOKOT-S) 8.6-50 MG tablet Take 1 tablet by mouth 2 (two) times daily.  . tamsulosin (FLOMAX) 0.4 MG CAPS capsule Take 1 capsule (0.4 mg total) by mouth daily after breakfast.     Allergies:   Indomethacin and Iodine-131   Social History   Tobacco Use  . Smoking status: Former Smoker    Quit date: 01/20/1957    Years since quitting: 62.3  . Smokeless tobacco: Never Used  Substance Use Topics  .  Alcohol use: No  . Drug use: Never     Family Hx: The patient's family history includes Heart attack in his father; Lung cancer in his sister.  ROS:   Please see the history of present illness.    All other systems reviewed and are negative.   Prior CV studies:   The following studies were reviewed today: Echo 02/13/2019- IMPRESSIONS    1. Left ventricular ejection fraction, by visual estimation, is 60 to  65%. The left ventricle has normal function. There is moderately increased  left ventricular hypertrophy.  2. Definity contrast agent was given IV to delineate the left ventricular  endocardial borders.  3. Left ventricular diastolic parameters are indeterminate.  4. The left ventricle has no regional wall motion abnormalities.  5. Global right ventricle has normal systolic function.The right  ventricular size is normal. Right vetricular wall thickness was not  assessed.  6. Left atrial size was severely dilated.  7. Right atrial size was normal.  8. The mitral valve is grossly normal. No evidence of mitral valve  regurgitation.  9. The tricuspid valve is grossly normal.  10. The tricuspid valve is grossly  normal. Tricuspid valve regurgitation  is mild.  11. The aortic valve is tricuspid. Aortic valve regurgitation is not  visualized. No evidence of aortic valve sclerosis or stenosis.  12. The pulmonic valve was grossly normal. Pulmonic valve regurgitation is  not visualized.  13. Mildly elevated pulmonary artery systolic pressure.  14. The interatrial septum was not well visualized.   Labs/Other Tests and Data Reviewed:    EKG:  An ECG dated 04/11/2019 was personally reviewed today and demonstrated: NSR with VR 66.  Recent Labs: 03/07/2019: TSH 0.013 04/11/2019: ALT 16; B Natriuretic Peptide 243.7 04/13/2019: Magnesium 2.0 04/28/2019: BUN 20; Creatinine, Ser 1.47; Hemoglobin 9.9; Platelets 235; Potassium 3.9; Sodium 140   Recent Lipid Panel Lab Results  Component Value Date/Time   CHOL 138 05/28/2011 10:30 AM   TRIG 81 05/28/2011 10:30 AM   HDL 43 05/28/2011 10:30 AM   CHOLHDL 3.2 05/28/2011 10:30 AM   LDLCALC 79 05/28/2011 10:30 AM    Wt Readings from Last 3 Encounters:  05/20/19 267 lb (121.1 kg)  05/19/19 267 lb (121.1 kg)  05/03/19 275 lb (124.7 kg)     Objective:    Vital Signs:  Ht 6' (1.829 m)   Wt 267 lb (121.1 kg)   BMI 36.21 kg/m    VITAL SIGNS:  reviewed- his HR in the ED yesterday was 70 (no EKG done)  ASSESSMENT & PLAN:    AF with RVR- New onset Jan 2021 in setting of COVID, SBO, and hyperthyroidism. Pt discharged on Amiodarone and had converted to NSR on his 04/11/2019 EKG-   Anticoagulated- On Eliquis  CAD- H/O MI- LAD PCI in 2014.- No recent angina  Hyperthyroidism- PCP following  Hematuria- Hospitalized 3/22/-3/25 with UTI, back pain, pneumonia D/C'd back to SNF.  He has a foley in for the next two weeks and will then be re-evaluated by Dr Claudia Desanctis.  Renal insufficiency SCr has ranged from 0.96 to 1.47.   Plan:  Same Rx from cardiac standpoint- OV in 2-3 months- check LFTs then on Amiodarone.   COVID-19 Education: The signs and symptoms of  COVID-19 were discussed with the patient and how to seek care for testing (follow up with PCP or arrange E-visit).  The importance of social distancing was discussed today.  Time:   Today, I have spent 15 minutes with the  patient with telehealth technology discussing the above problems.     Medication Adjustments/Labs and Tests Ordered: Current medicines are reviewed at length with the patient today.  Concerns regarding medicines are outlined above.   Tests Ordered: No orders of the defined types were placed in this encounter.   Medication Changes: No orders of the defined types were placed in this encounter.   Follow Up:  In Person in 2-3 months with Dr Oval Linsey.   Angelena Form, PA-C  05/20/2019 9:23 AM    [facility]

## 2019-05-20 NOTE — Patient Instructions (Signed)
Medication Instructions:  Your physician recommends that you continue on your current medications as directed. Please refer to the Current Medication list given to you today.  *If you need a refill on your cardiac medications before your next appointment, please call your pharmacy*  Follow-Up: At [facility], you and your health needs are our priority.  As part of our continuing mission to provide you with exceptional heart care, we have created designated Provider Care Teams.  These Care Teams include your primary Cardiologist (physician) and Advanced Practice Providers (APPs -  Physician Assistants and Nurse Practitioners) who all work together to provide you with the care you need, when you need it.  We recommend signing up for the patient portal called "MyChart".  Sign up information is provided on this After Visit Summary.  MyChart is used to connect with patients for Virtual Visits (Telemedicine).  Patients are able to view lab/test results, encounter notes, upcoming appointments, etc.  Non-urgent messages can be sent to your provider as well.   To learn more about what you can do with MyChart, go to [email or web address].    Your next appointment:   2-3 month(s)  The format for your next appointment:   In Person  Provider:   Skeet Latch, MD

## 2019-05-22 LAB — URINE CULTURE: Culture: >=100000 — AB

## 2019-05-23 ENCOUNTER — Telehealth: Payer: Self-pay | Admitting: *Deleted

## 2019-05-23 DIAGNOSIS — R972 Elevated prostate specific antigen [PSA]: Secondary | ICD-10-CM | POA: Diagnosis not present

## 2019-05-23 DIAGNOSIS — D649 Anemia, unspecified: Secondary | ICD-10-CM | POA: Diagnosis not present

## 2019-05-23 DIAGNOSIS — E0590 Thyrotoxicosis, unspecified without thyrotoxic crisis or storm: Secondary | ICD-10-CM | POA: Diagnosis not present

## 2019-05-23 DIAGNOSIS — E782 Mixed hyperlipidemia: Secondary | ICD-10-CM | POA: Diagnosis not present

## 2019-05-23 DIAGNOSIS — R7301 Impaired fasting glucose: Secondary | ICD-10-CM | POA: Diagnosis not present

## 2019-05-23 DIAGNOSIS — I10 Essential (primary) hypertension: Secondary | ICD-10-CM | POA: Diagnosis not present

## 2019-05-23 NOTE — Telephone Encounter (Signed)
Post ED Visit - Positive Culture Follow-up  Culture report reviewed by antimicrobial stewardship pharmacist: [place] Team []  Elenor Quinones, Pharm.D. []  Heide Guile, Pharm.D., BCPS AQ-ID []  Parks Neptune, Pharm.D., BCPS []  Alycia Rossetti, Pharm.D., BCPS []  [place], [place].D., BCPS, AAHIVP []  Legrand Como, Pharm.D., BCPS, AAHIVP []  Salome Arnt, PharmD, BCPS []  Johnnette Gourd, PharmD, BCPS []  Hughes Better, PharmD, BCPS []  Leeroy Cha, PharmD []  Laqueta Linden, PharmD, BCPS []  Albertina Parr, PharmD Bertis Ruddy, PharmD  [place] Team []  Leodis Sias, PharmD []  Lindell Spar, PharmD []  Royetta Asal, PharmD []  Graylin Shiver, Rph []  Rema Fendt) Glennon Mac, PharmD []  Arlyn Dunning, PharmD []  Netta Cedars, PharmD []  Dia Sitter, PharmD []  Leone Haven, PharmD []  Gretta Arab, PharmD []  Theodis Shove, PharmD []  Peggyann Juba, PharmD []  Reuel Boom, PharmD   Positive urine culture Follow up with urology and no further patient follow-up is required at this time.  Harlon Flor [facility] 05/23/2019, 11:20 AM

## 2019-05-30 DIAGNOSIS — R338 Other retention of urine: Secondary | ICD-10-CM | POA: Diagnosis not present

## 2019-05-30 DIAGNOSIS — N401 Enlarged prostate with lower urinary tract symptoms: Secondary | ICD-10-CM | POA: Diagnosis not present

## 2019-06-04 DIAGNOSIS — I119 Hypertensive heart disease without heart failure: Secondary | ICD-10-CM | POA: Diagnosis not present

## 2019-06-04 DIAGNOSIS — E782 Mixed hyperlipidemia: Secondary | ICD-10-CM | POA: Diagnosis not present

## 2019-06-04 DIAGNOSIS — E0590 Thyrotoxicosis, unspecified without thyrotoxic crisis or storm: Secondary | ICD-10-CM | POA: Diagnosis not present

## 2019-06-04 DIAGNOSIS — N400 Enlarged prostate without lower urinary tract symptoms: Secondary | ICD-10-CM | POA: Diagnosis not present

## 2019-06-04 DIAGNOSIS — R339 Retention of urine, unspecified: Secondary | ICD-10-CM | POA: Diagnosis not present

## 2019-06-04 DIAGNOSIS — Z48816 Encounter for surgical aftercare following surgery on the genitourinary system: Secondary | ICD-10-CM | POA: Diagnosis not present

## 2019-06-04 DIAGNOSIS — Z87891 Personal history of nicotine dependence: Secondary | ICD-10-CM | POA: Diagnosis not present

## 2019-06-04 DIAGNOSIS — D649 Anemia, unspecified: Secondary | ICD-10-CM | POA: Diagnosis not present

## 2019-06-04 DIAGNOSIS — Z466 Encounter for fitting and adjustment of urinary device: Secondary | ICD-10-CM | POA: Diagnosis not present

## 2019-06-04 DIAGNOSIS — E43 Unspecified severe protein-calorie malnutrition: Secondary | ICD-10-CM | POA: Diagnosis not present

## 2019-06-04 DIAGNOSIS — I480 Paroxysmal atrial fibrillation: Secondary | ICD-10-CM | POA: Diagnosis not present

## 2019-06-04 DIAGNOSIS — Z8616 Personal history of COVID-19: Secondary | ICD-10-CM | POA: Diagnosis not present

## 2019-06-04 DIAGNOSIS — R7301 Impaired fasting glucose: Secondary | ICD-10-CM | POA: Diagnosis not present

## 2019-06-04 DIAGNOSIS — K219 Gastro-esophageal reflux disease without esophagitis: Secondary | ICD-10-CM | POA: Diagnosis not present

## 2019-06-04 DIAGNOSIS — M5416 Radiculopathy, lumbar region: Secondary | ICD-10-CM | POA: Diagnosis not present

## 2019-06-04 DIAGNOSIS — I2510 Atherosclerotic heart disease of native coronary artery without angina pectoris: Secondary | ICD-10-CM | POA: Diagnosis not present

## 2019-06-04 DIAGNOSIS — K56699 Other intestinal obstruction unspecified as to partial versus complete obstruction: Secondary | ICD-10-CM | POA: Diagnosis not present

## 2019-06-04 DIAGNOSIS — Z7901 Long term (current) use of anticoagulants: Secondary | ICD-10-CM | POA: Diagnosis not present

## 2019-06-04 DIAGNOSIS — E785 Hyperlipidemia, unspecified: Secondary | ICD-10-CM | POA: Diagnosis not present

## 2019-06-04 DIAGNOSIS — N390 Urinary tract infection, site not specified: Secondary | ICD-10-CM | POA: Diagnosis not present

## 2019-06-04 DIAGNOSIS — J90 Pleural effusion, not elsewhere classified: Secondary | ICD-10-CM | POA: Diagnosis not present

## 2019-06-04 DIAGNOSIS — I10 Essential (primary) hypertension: Secondary | ICD-10-CM | POA: Diagnosis not present

## 2019-06-04 DIAGNOSIS — E039 Hypothyroidism, unspecified: Secondary | ICD-10-CM | POA: Diagnosis not present

## 2019-06-04 DIAGNOSIS — R972 Elevated prostate specific antigen [PSA]: Secondary | ICD-10-CM | POA: Diagnosis not present

## 2019-06-09 DIAGNOSIS — C44219 Basal cell carcinoma of skin of left ear and external auricular canal: Secondary | ICD-10-CM | POA: Diagnosis not present

## 2019-06-09 DIAGNOSIS — C44229 Squamous cell carcinoma of skin of left ear and external auricular canal: Secondary | ICD-10-CM | POA: Diagnosis not present

## 2019-06-09 DIAGNOSIS — L720 Epidermal cyst: Secondary | ICD-10-CM | POA: Diagnosis not present

## 2019-06-13 DIAGNOSIS — R338 Other retention of urine: Secondary | ICD-10-CM | POA: Diagnosis not present

## 2019-06-13 DIAGNOSIS — N401 Enlarged prostate with lower urinary tract symptoms: Secondary | ICD-10-CM | POA: Diagnosis not present

## 2019-06-15 ENCOUNTER — Ambulatory Visit: Payer: PPO | Admitting: Cardiology

## 2019-06-23 DIAGNOSIS — S0501XA Injury of conjunctiva and corneal abrasion without foreign body, right eye, initial encounter: Secondary | ICD-10-CM | POA: Diagnosis not present

## 2019-06-24 DIAGNOSIS — S0501XA Injury of conjunctiva and corneal abrasion without foreign body, right eye, initial encounter: Secondary | ICD-10-CM | POA: Diagnosis not present

## 2019-06-25 ENCOUNTER — Other Ambulatory Visit: Payer: Self-pay

## 2019-06-25 ENCOUNTER — Inpatient Hospital Stay (HOSPITAL_COMMUNITY): Payer: PPO

## 2019-06-25 ENCOUNTER — Emergency Department (HOSPITAL_COMMUNITY): Payer: PPO

## 2019-06-25 ENCOUNTER — Encounter (HOSPITAL_COMMUNITY): Payer: Self-pay | Admitting: Emergency Medicine

## 2019-06-25 ENCOUNTER — Inpatient Hospital Stay (HOSPITAL_COMMUNITY)
Admission: EM | Admit: 2019-06-25 | Discharge: 2019-07-04 | DRG: 329 | Disposition: A | Discharge status: Skilled Nursing Facility | Payer: PPO | Attending: Internal Medicine | Admitting: Internal Medicine

## 2019-06-25 DIAGNOSIS — I4891 Unspecified atrial fibrillation: Secondary | ICD-10-CM | POA: Diagnosis not present

## 2019-06-25 DIAGNOSIS — I25119 Atherosclerotic heart disease of native coronary artery with unspecified angina pectoris: Secondary | ICD-10-CM | POA: Diagnosis not present

## 2019-06-25 DIAGNOSIS — I10 Essential (primary) hypertension: Secondary | ICD-10-CM | POA: Diagnosis not present

## 2019-06-25 DIAGNOSIS — Z79899 Other long term (current) drug therapy: Secondary | ICD-10-CM

## 2019-06-25 DIAGNOSIS — Z9049 Acquired absence of other specified parts of digestive tract: Secondary | ICD-10-CM

## 2019-06-25 DIAGNOSIS — B3749 Other urogenital candidiasis: Secondary | ICD-10-CM | POA: Diagnosis present

## 2019-06-25 DIAGNOSIS — I252 Old myocardial infarction: Secondary | ICD-10-CM | POA: Diagnosis not present

## 2019-06-25 DIAGNOSIS — E785 Hyperlipidemia, unspecified: Secondary | ICD-10-CM | POA: Diagnosis not present

## 2019-06-25 DIAGNOSIS — J690 Pneumonitis due to inhalation of food and vomit: Secondary | ICD-10-CM | POA: Diagnosis not present

## 2019-06-25 DIAGNOSIS — I129 Hypertensive chronic kidney disease with stage 1 through stage 4 chronic kidney disease, or unspecified chronic kidney disease: Secondary | ICD-10-CM | POA: Diagnosis not present

## 2019-06-25 DIAGNOSIS — I700 Atherosclerosis of aorta: Secondary | ICD-10-CM | POA: Diagnosis not present

## 2019-06-25 DIAGNOSIS — Z03818 Encounter for observation for suspected exposure to other biological agents ruled out: Secondary | ICD-10-CM | POA: Diagnosis not present

## 2019-06-25 DIAGNOSIS — Z4682 Encounter for fitting and adjustment of non-vascular catheter: Secondary | ICD-10-CM | POA: Diagnosis not present

## 2019-06-25 DIAGNOSIS — M6281 Muscle weakness (generalized): Secondary | ICD-10-CM | POA: Diagnosis not present

## 2019-06-25 DIAGNOSIS — Z9861 Coronary angioplasty status: Secondary | ICD-10-CM | POA: Diagnosis not present

## 2019-06-25 DIAGNOSIS — I480 Paroxysmal atrial fibrillation: Secondary | ICD-10-CM | POA: Diagnosis not present

## 2019-06-25 DIAGNOSIS — K660 Peritoneal adhesions (postprocedural) (postinfection): Secondary | ICD-10-CM | POA: Diagnosis present

## 2019-06-25 DIAGNOSIS — I2510 Atherosclerotic heart disease of native coronary artery without angina pectoris: Secondary | ICD-10-CM

## 2019-06-25 DIAGNOSIS — Z978 Presence of other specified devices: Secondary | ICD-10-CM | POA: Diagnosis not present

## 2019-06-25 DIAGNOSIS — N400 Enlarged prostate without lower urinary tract symptoms: Secondary | ICD-10-CM | POA: Diagnosis present

## 2019-06-25 DIAGNOSIS — N390 Urinary tract infection, site not specified: Secondary | ICD-10-CM | POA: Diagnosis not present

## 2019-06-25 DIAGNOSIS — F439 Reaction to severe stress, unspecified: Secondary | ICD-10-CM | POA: Diagnosis not present

## 2019-06-25 DIAGNOSIS — K6389 Other specified diseases of intestine: Secondary | ICD-10-CM | POA: Diagnosis not present

## 2019-06-25 DIAGNOSIS — Z8616 Personal history of COVID-19: Secondary | ICD-10-CM

## 2019-06-25 DIAGNOSIS — R05 Cough: Secondary | ICD-10-CM | POA: Diagnosis not present

## 2019-06-25 DIAGNOSIS — E782 Mixed hyperlipidemia: Secondary | ICD-10-CM | POA: Diagnosis present

## 2019-06-25 DIAGNOSIS — B964 Proteus (mirabilis) (morganii) as the cause of diseases classified elsewhere: Secondary | ICD-10-CM | POA: Diagnosis not present

## 2019-06-25 DIAGNOSIS — L89322 Pressure ulcer of left buttock, stage 2: Secondary | ICD-10-CM | POA: Diagnosis not present

## 2019-06-25 DIAGNOSIS — E876 Hypokalemia: Secondary | ICD-10-CM | POA: Diagnosis present

## 2019-06-25 DIAGNOSIS — E669 Obesity, unspecified: Secondary | ICD-10-CM | POA: Diagnosis not present

## 2019-06-25 DIAGNOSIS — Z7901 Long term (current) use of anticoagulants: Secondary | ICD-10-CM | POA: Diagnosis not present

## 2019-06-25 DIAGNOSIS — E8770 Fluid overload, unspecified: Secondary | ICD-10-CM | POA: Diagnosis not present

## 2019-06-25 DIAGNOSIS — K219 Gastro-esophageal reflux disease without esophagitis: Secondary | ICD-10-CM | POA: Diagnosis not present

## 2019-06-25 DIAGNOSIS — Z87891 Personal history of nicotine dependence: Secondary | ICD-10-CM

## 2019-06-25 DIAGNOSIS — R109 Unspecified abdominal pain: Secondary | ICD-10-CM

## 2019-06-25 DIAGNOSIS — T83518A Infection and inflammatory reaction due to other urinary catheter, initial encounter: Secondary | ICD-10-CM | POA: Diagnosis present

## 2019-06-25 DIAGNOSIS — Z48815 Encounter for surgical aftercare following surgery on the digestive system: Secondary | ICD-10-CM | POA: Diagnosis not present

## 2019-06-25 DIAGNOSIS — N189 Chronic kidney disease, unspecified: Secondary | ICD-10-CM | POA: Diagnosis not present

## 2019-06-25 DIAGNOSIS — M5416 Radiculopathy, lumbar region: Secondary | ICD-10-CM | POA: Diagnosis not present

## 2019-06-25 DIAGNOSIS — Z801 Family history of malignant neoplasm of trachea, bronchus and lung: Secondary | ICD-10-CM

## 2019-06-25 DIAGNOSIS — J90 Pleural effusion, not elsewhere classified: Secondary | ICD-10-CM | POA: Diagnosis not present

## 2019-06-25 DIAGNOSIS — Z6835 Body mass index (BMI) 35.0-35.9, adult: Secondary | ICD-10-CM

## 2019-06-25 DIAGNOSIS — N179 Acute kidney failure, unspecified: Secondary | ICD-10-CM | POA: Diagnosis present

## 2019-06-25 DIAGNOSIS — D62 Acute posthemorrhagic anemia: Secondary | ICD-10-CM | POA: Diagnosis not present

## 2019-06-25 DIAGNOSIS — R262 Difficulty in walking, not elsewhere classified: Secondary | ICD-10-CM | POA: Diagnosis not present

## 2019-06-25 DIAGNOSIS — D649 Anemia, unspecified: Secondary | ICD-10-CM | POA: Diagnosis present

## 2019-06-25 DIAGNOSIS — E0590 Thyrotoxicosis, unspecified without thyrotoxic crisis or storm: Secondary | ICD-10-CM | POA: Diagnosis not present

## 2019-06-25 DIAGNOSIS — I517 Cardiomegaly: Secondary | ICD-10-CM | POA: Diagnosis not present

## 2019-06-25 DIAGNOSIS — R059 Cough, unspecified: Secondary | ICD-10-CM

## 2019-06-25 DIAGNOSIS — Z741 Need for assistance with personal care: Secondary | ICD-10-CM | POA: Diagnosis not present

## 2019-06-25 DIAGNOSIS — Z66 Do not resuscitate: Secondary | ICD-10-CM | POA: Diagnosis present

## 2019-06-25 DIAGNOSIS — Z20822 Contact with and (suspected) exposure to covid-19: Secondary | ICD-10-CM | POA: Diagnosis not present

## 2019-06-25 DIAGNOSIS — Z8249 Family history of ischemic heart disease and other diseases of the circulatory system: Secondary | ICD-10-CM

## 2019-06-25 DIAGNOSIS — J811 Chronic pulmonary edema: Secondary | ICD-10-CM | POA: Diagnosis not present

## 2019-06-25 DIAGNOSIS — E0789 Other specified disorders of thyroid: Secondary | ICD-10-CM | POA: Diagnosis not present

## 2019-06-25 DIAGNOSIS — I482 Chronic atrial fibrillation, unspecified: Secondary | ICD-10-CM | POA: Diagnosis present

## 2019-06-25 DIAGNOSIS — Z01810 Encounter for preprocedural cardiovascular examination: Secondary | ICD-10-CM | POA: Diagnosis not present

## 2019-06-25 DIAGNOSIS — R0989 Other specified symptoms and signs involving the circulatory and respiratory systems: Secondary | ICD-10-CM

## 2019-06-25 DIAGNOSIS — K439 Ventral hernia without obstruction or gangrene: Secondary | ICD-10-CM | POA: Diagnosis not present

## 2019-06-25 DIAGNOSIS — Z955 Presence of coronary angioplasty implant and graft: Secondary | ICD-10-CM

## 2019-06-25 DIAGNOSIS — Y846 Urinary catheterization as the cause of abnormal reaction of the patient, or of later complication, without mention of misadventure at the time of the procedure: Secondary | ICD-10-CM | POA: Diagnosis present

## 2019-06-25 DIAGNOSIS — M5417 Radiculopathy, lumbosacral region: Secondary | ICD-10-CM | POA: Diagnosis not present

## 2019-06-25 DIAGNOSIS — K436 Other and unspecified ventral hernia with obstruction, without gangrene: Principal | ICD-10-CM

## 2019-06-25 DIAGNOSIS — Z95828 Presence of other vascular implants and grafts: Secondary | ICD-10-CM | POA: Diagnosis not present

## 2019-06-25 DIAGNOSIS — Z888 Allergy status to other drugs, medicaments and biological substances status: Secondary | ICD-10-CM

## 2019-06-25 DIAGNOSIS — K56609 Unspecified intestinal obstruction, unspecified as to partial versus complete obstruction: Secondary | ICD-10-CM | POA: Diagnosis not present

## 2019-06-25 DIAGNOSIS — N4289 Other specified disorders of prostate: Secondary | ICD-10-CM | POA: Diagnosis not present

## 2019-06-25 HISTORY — DX: Hypothyroidism, unspecified: E03.9

## 2019-06-25 HISTORY — DX: Paroxysmal atrial fibrillation: I48.0

## 2019-06-25 HISTORY — DX: Essential (primary) hypertension: I10

## 2019-06-25 HISTORY — DX: Atherosclerotic heart disease of native coronary artery without angina pectoris: I25.10

## 2019-06-25 LAB — CBC WITH DIFFERENTIAL/PLATELET
Abs Immature Granulocytes: 0.08 10*3/uL — ABNORMAL HIGH (ref 0.00–0.07)
Basophils Absolute: 0 10*3/uL (ref 0.0–0.1)
Basophils Relative: 0 %
Eosinophils Absolute: 0 10*3/uL (ref 0.0–0.5)
Eosinophils Relative: 0 %
HCT: 35.1 % — ABNORMAL LOW (ref 39.0–52.0)
Hemoglobin: 11.2 g/dL — ABNORMAL LOW (ref 13.0–17.0)
Immature Granulocytes: 1 %
Lymphocytes Relative: 13 %
Lymphs Abs: 1.6 10*3/uL (ref 0.7–4.0)
MCH: 27.9 pg (ref 26.0–34.0)
MCHC: 31.9 g/dL (ref 30.0–36.0)
MCV: 87.5 fL (ref 80.0–100.0)
Monocytes Absolute: 1 10*3/uL (ref 0.1–1.0)
Monocytes Relative: 8 %
Neutro Abs: 9.2 10*3/uL — ABNORMAL HIGH (ref 1.7–7.7)
Neutrophils Relative %: 78 %
Platelets: 247 10*3/uL (ref 150–400)
RBC: 4.01 MIL/uL — ABNORMAL LOW (ref 4.22–5.81)
RDW: 14.9 % (ref 11.5–15.5)
WBC: 11.8 10*3/uL — ABNORMAL HIGH (ref 4.0–10.5)
nRBC: 0 % (ref 0.0–0.2)

## 2019-06-25 LAB — URINALYSIS, ROUTINE W REFLEX MICROSCOPIC
Bilirubin Urine: NEGATIVE
Glucose, UA: NEGATIVE mg/dL
Hgb urine dipstick: NEGATIVE
Ketones, ur: 5 mg/dL — AB
Nitrite: NEGATIVE
Protein, ur: 100 mg/dL — AB
Specific Gravity, Urine: 1.017 (ref 1.005–1.030)
WBC, UA: >50 WBC/hpf — ABNORMAL HIGH (ref 0–5)
pH: 8 (ref 5.0–8.0)

## 2019-06-25 LAB — COMPREHENSIVE METABOLIC PANEL
ALT: 13 U/L (ref 0–44)
AST: 15 U/L (ref 15–41)
Albumin: 4.1 g/dL (ref 3.5–5.0)
Alkaline Phosphatase: 89 U/L (ref 38–126)
Anion gap: 12 (ref 5–15)
BUN: 28 mg/dL — ABNORMAL HIGH (ref 8–23)
CO2: 29 mmol/L (ref 22–32)
Calcium: 9 mg/dL (ref 8.9–10.3)
Chloride: 99 mmol/L (ref 98–111)
Creatinine, Ser: 1.72 mg/dL — ABNORMAL HIGH (ref 0.61–1.24)
GFR calc Af Amer: 42 mL/min — ABNORMAL LOW (ref >60)
GFR calc non Af Amer: 36 mL/min — ABNORMAL LOW (ref >60)
Glucose, Bld: 120 mg/dL — ABNORMAL HIGH (ref 70–99)
Potassium: 3.8 mmol/L (ref 3.5–5.1)
Sodium: 140 mmol/L (ref 135–145)
Total Bilirubin: 0.9 mg/dL (ref 0.3–1.2)
Total Protein: 7 g/dL (ref 6.5–8.1)

## 2019-06-25 LAB — SARS CORONAVIRUS 2 BY RT PCR (HOSPITAL ORDER, PERFORMED IN ~~LOC~~ HOSPITAL LAB): SARS Coronavirus 2: NEGATIVE

## 2019-06-25 LAB — LACTIC ACID, PLASMA: Lactic Acid, Venous: 1.2 mmol/L (ref 0.5–1.9)

## 2019-06-25 LAB — LIPASE, BLOOD: Lipase: 18 U/L (ref 11–51)

## 2019-06-25 LAB — POC OCCULT BLOOD, ED: Fecal Occult Bld: POSITIVE — AB

## 2019-06-25 MED ORDER — ONDANSETRON HCL 4 MG/2ML IJ SOLN
4.0000 mg | Freq: Once | INTRAMUSCULAR | Status: AC
  Administered 2019-06-25: 4 mg via INTRAVENOUS
  Filled 2019-06-25: qty 2

## 2019-06-25 MED ORDER — SODIUM CHLORIDE 0.9 % IV BOLUS
500.0000 mL | Freq: Once | INTRAVENOUS | Status: AC
  Administered 2019-06-25: 500 mL via INTRAVENOUS

## 2019-06-25 MED ORDER — MORPHINE SULFATE (PF) 4 MG/ML IV SOLN
4.0000 mg | Freq: Once | INTRAVENOUS | Status: AC
  Administered 2019-06-25: 4 mg via INTRAVENOUS
  Filled 2019-06-25: qty 1

## 2019-06-25 MED ORDER — OFLOXACIN 0.3 % OP SOLN
1.0000 [drp] | Freq: Four times a day (QID) | OPHTHALMIC | Status: DC
  Administered 2019-06-25 – 2019-07-04 (×27): 1 [drp] via OPHTHALMIC
  Filled 2019-06-25 (×3): qty 5

## 2019-06-25 MED ORDER — SODIUM CHLORIDE 0.9 % IV SOLN: INTRAVENOUS | Status: DC

## 2019-06-25 MED ORDER — LORAZEPAM 2 MG/ML IJ SOLN
0.5000 mg | Freq: Once | INTRAMUSCULAR | Status: AC
  Administered 2019-06-25: 0.5 mg via INTRAVENOUS
  Filled 2019-06-25: qty 1

## 2019-06-25 MED ORDER — ONDANSETRON HCL 4 MG/2ML IJ SOLN
4.0000 mg | Freq: Four times a day (QID) | INTRAMUSCULAR | Status: DC | PRN
  Administered 2019-06-28: 4 mg via INTRAVENOUS
  Filled 2019-06-25: qty 2

## 2019-06-25 MED ORDER — MORPHINE SULFATE (PF) 2 MG/ML IV SOLN
2.0000 mg | INTRAVENOUS | Status: DC | PRN
  Administered 2019-06-28 – 2019-06-29 (×7): 2 mg via INTRAVENOUS
  Filled 2019-06-25 (×7): qty 1

## 2019-06-25 MED ORDER — PANTOPRAZOLE SODIUM 40 MG IV SOLR
40.0000 mg | Freq: Two times a day (BID) | INTRAVENOUS | Status: DC
  Administered 2019-06-25 – 2019-07-04 (×18): 40 mg via INTRAVENOUS
  Filled 2019-06-25 (×18): qty 40

## 2019-06-25 MED ORDER — ONDANSETRON HCL 4 MG PO TABS: 4.0000 mg | ORAL_TABLET | Freq: Four times a day (QID) | ORAL | Status: DC | PRN

## 2019-06-25 MED ORDER — FLUCONAZOLE 100MG IVPB
100.0000 mg | INTRAVENOUS | Status: DC
  Filled 2019-06-25 (×2): qty 50

## 2019-06-25 MED ORDER — FLUCONAZOLE IN SODIUM CHLORIDE 200-0.9 MG/100ML-% IV SOLN
200.0000 mg | Freq: Once | INTRAVENOUS | Status: AC
  Administered 2019-06-25: 200 mg via INTRAVENOUS
  Filled 2019-06-25 (×2): qty 100

## 2019-06-25 MED ORDER — METOPROLOL TARTRATE 5 MG/5ML IV SOLN
5.0000 mg | Freq: Four times a day (QID) | INTRAVENOUS | Status: DC
  Administered 2019-06-25 – 2019-06-28 (×4): 5 mg via INTRAVENOUS
  Filled 2019-06-25 (×5): qty 5

## 2019-06-25 MED ORDER — PANTOPRAZOLE SODIUM 40 MG IV SOLR
40.0000 mg | Freq: Once | INTRAVENOUS | Status: AC
  Administered 2019-06-25: 40 mg via INTRAVENOUS
  Filled 2019-06-25: qty 40

## 2019-06-25 NOTE — ED Notes (Signed)
Dr. Stinson at bedside.  

## 2019-06-25 NOTE — H&P (Addendum)
History and Physical  Derek Foster WCB:[id number] DOB: 1935/11/19 DOA: 06/25/2019  Referring physician: Charmaine Downs, Hershal Coria, ED provider PCP: Celene Squibb, MD  Outpatient Specialists:   Patient Coming From: home  Chief Complaint: abdominal pain, n/v  HPI: Derek Foster is a 84 y.o. male with a history of CAD with history of MI and stenting, A. fib on chronic anticoagulation, hypertension, hyperlipidemia, hypothyroidism, GERD, chronic indwelling urinary catheter, ventral hernia.  Patient presents to the hospital with 2 days of generalized lower abdominal pain with coffee-ground emesis with a couple of episodes today.  No diarrhea.  Reports normal bowel movement yesterday.  No hematuria, melena, hematochezia.  Emergency Department Course: CT of the abdomen/pelvis shows small bowel obstructions coming from ventral hernia with loop of bowel in the hernia.  General surgery consulted. NG tube placed.  Review of Systems:   Pt denies any fevers, chills, diarrhea, constipation, shortness of breath, dyspnea on exertion, orthopnea, cough, wheezing, palpitations, headache, vision changes, lightheadedness, dizziness, melena, rectal bleeding.  Review of systems are otherwise negative  Past Medical History:  Diagnosis Date  . Acute myocardial infarction, unspecified site, episode of care unspecified   . CAD in native artery    a. cath 01/27/2009 : s/p promus DES to LAD and medical managment of 70-80% mRCA  . COVID-19   . Edema   . HTN (hypertension)   . Mixed hyperlipidemia   . Unspecified hypothyroidism    Past Surgical History:  Procedure Laterality Date  . CHOLECYSTECTOMY    . CORONARY STENT PLACEMENT    . CYSTOSCOPY/RETROGRADE/URETEROSCOPY Bilateral 03/09/2019   Procedure: CYSTOSCOPY, Clot Evacuation, Fulguration;  Surgeon: Festus Aloe, MD;  Location: [facility];  Service: Urology;  Laterality: Bilateral;  . HERNIA REPAIR     Social History:  reports that he quit smoking about 62 years  ago. He has never used smokeless tobacco. He reports that he does not drink alcohol or use drugs. Patient lives at home  Allergies  Allergen Reactions  . Indomethacin Anaphylaxis    But tolerates ibuprofen, aleve  . Iodine-131 Anaphylaxis    Family History  Problem Relation Age of Onset  . Heart attack Father   . Lung cancer Sister       Prior to Admission medications   Medication Sig Start Date End Date Taking? Authorizing Provider  amiodarone (PACERONE) 200 MG tablet Take 1 tablet (200 mg total) by mouth daily. 05/03/19  Yes Gerlene Fee, NP  apixaban (ELIQUIS) 5 MG TABS tablet Take 1 tablet (5 mg total) by mouth 2 (two) times daily. 05/03/19  Yes Gerlene Fee, NP  bisacodyl (DULCOLAX) 10 MG suppository Place 1 suppository (10 mg total) rectally daily as needed for severe constipation. 03/12/19  Yes Bonnell Public Tublu, MD  Coenzyme Q10 200 MG capsule Take 200 mg by mouth daily.     Yes [provider]  digoxin (LANOXIN) 0.125 MG tablet Take 125 mcg by mouth daily. 06/01/19  Yes [provider]  diltiazem (CARDIZEM CD) 180 MG 24 hr capsule Take 1 capsule (180 mg total) by mouth daily. 05/03/19  Yes Gerlene Fee, NP  finasteride (PROSCAR) 5 MG tablet Take 1 tablet (5 mg total) by mouth daily. 05/03/19  Yes Gerlene Fee, NP  furosemide (LASIX) 20 MG tablet Take 2 tablets (40 mg total) by mouth daily. 05/03/19  Yes Gerlene Fee, NP  methimazole (TAPAZOLE) 10 MG tablet Take 2 tablets (20 mg total) by mouth 2 (two) times daily. 05/03/19  Yes Gerlene Fee, NP  methocarbamol (ROBAXIN) 750 MG tablet Take 1 tablet (750 mg total) by mouth every 8 (eight) hours as needed for muscle spasms. 05/03/19  Yes Gerlene Fee, NP  metoprolol tartrate (LOPRESSOR) 100 MG tablet Take 0.5 tablets (50 mg total) by mouth 2 (two) times daily. 05/03/19  Yes Gerlene Fee, NP  Multiple Vitamin (MULTIVITAMIN) tablet Take 1 tablet by mouth daily.     Yes [provider]  nitroGLYCERIN (NITROSTAT) 0.4 MG SL tablet Place 1 tablet (0.4 mg total) under the tongue every 5 (five) minutes as needed. 05/03/19  Yes Gerlene Fee, NP  Nutritional Supplements (ENSURE CLEAR) LIQD Take 237 mLs by mouth daily.   Yes [provider]  ofloxacin (OCUFLOX) 0.3 % ophthalmic solution Place 1 drop into both eyes 4 (four) times daily.  06/23/19  Yes [provider]  pantoprazole (PROTONIX) 40 MG tablet Take 40 mg by mouth daily. 06/01/19  Yes [provider]  polyethylene glycol (MIRALAX / GLYCOLAX) 17 g packet Take 17 g by mouth daily as needed for mild constipation. 03/12/19  Yes Bonnell Public Tublu, MD  potassium chloride (KLOR-CON) 10 MEQ tablet Take 1 tablet (10 mEq total) by mouth every evening. 05/03/19  Yes Gerlene Fee, NP  pravastatin (PRAVACHOL) 10 MG tablet Take 1 tablet (10 mg total) by mouth daily at 6 PM. 05/03/19  Yes Green, Phylis Bougie, NP  promethazine (PHENERGAN) 25 MG tablet Take 25 mg by mouth every 6 (six) hours as needed for nausea or vomiting.   Yes [provider]  senna-docusate (SENOKOT-S) 8.6-50 MG tablet Take 1 tablet by mouth 2 (two) times daily. Patient taking differently: Take 1 tablet by mouth daily as needed for mild constipation or moderate constipation.  03/12/19  Yes Vashti Hey, MD  tamsulosin (FLOMAX) 0.4 MG CAPS capsule Take 1 capsule (0.4 mg total) by mouth daily after breakfast. 05/03/19  Yes Gerlene Fee, NP  ondansetron (ZOFRAN) 4 MG tablet Take 1 tablet (4 mg total) by mouth every 6 (six) hours as needed for nausea or vomiting. Patient not taking: Reported on 06/25/2019 05/03/19   Gerlene Fee, NP  simvastatin (ZOCOR) 20 MG tablet Take 1 tablet (20 mg total) by mouth every evening. 02/25/11 04/02/11  Josue Hector, MD    Physical Exam: BP (!) 185/61   Pulse 62   Temp (!) 97.4 F (36.3 C) (Oral)   Resp 13   SpO2 93%   . General: elderly male. Awake and alert and  oriented x3. No acute cardiopulmonary distress.  Marland Kitchen HEENT: Normocephalic atraumatic.  Right and left ears normal in appearance.  Pupils equal, round, reactive to light. Extraocular muscles are intact. Sclerae anicteric and noninjected.  Moist mucosal membranes. No mucosal lesions.  . Neck: Neck supple without lymphadenopathy. No carotid bruits. No masses palpated.  . Cardiovascular: Regular rate with normal S1-S2 sounds. No murmurs, rubs, gallops auscultated. No JVD.  Marland Kitchen Respiratory: Good respiratory effort with no wheezes, rales, rhonchi. Lungs clear to auscultation bilaterally.  No accessory muscle use. . Abdomen: Soft, large ventral hernia with tenderness to palpation. Nondistended. Active bowel sounds. No masses or hepatosplenomegaly  . Skin: No rashes, lesions, or ulcerations.  Dry, warm to touch. 2+ dorsalis pedis and radial pulses. . Musculoskeletal: No calf or leg pain. All major joints not erythematous nontender.  No upper or lower joint deformation.  Good ROM.  No contractures  . Psychiatric: Intact judgment and insight. Pleasant  and cooperative. . Neurologic: No focal neurological deficits. Strength is 5/5 and symmetric in upper and lower extremities.  Cranial nerves II through XII are grossly intact.           Labs on Admission: I have personally reviewed following labs and imaging studies  CBC: Recent Labs  Lab 06/25/19 1600  WBC 11.8*  NEUTROABS 9.2*  HGB 11.2*  HCT 35.1*  MCV 87.5  PLT 188   Basic Metabolic Panel: Recent Labs  Lab 06/25/19 1600  NA 140  K 3.8  CL 99  CO2 29  GLUCOSE 120*  BUN 28*  CREATININE 1.72*  CALCIUM 9.0   GFR: CrCl cannot be calculated (Unknown ideal weight.). Liver Function Tests: Recent Labs  Lab 06/25/19 1600  AST 15  ALT 13  ALKPHOS 89  BILITOT 0.9  PROT 7.0  ALBUMIN 4.1   Recent Labs  Lab 06/25/19 1600  LIPASE 18   No results for input(s): AMMONIA in the last 168 hours. Coagulation Profile: No results for input(s):  INR, PROTIME in the last 168 hours. Cardiac Enzymes: No results for input(s): CKTOTAL, CKMB, CKMBINDEX, TROPONINI in the last 168 hours. BNP (last 3 results) No results for input(s): PROBNP in the last 8760 hours. HbA1C: No results for input(s): HGBA1C in the last 72 hours. CBG: No results for input(s): GLUCAP in the last 168 hours. Lipid Profile: No results for input(s): CHOL, HDL, LDLCALC, TRIG, CHOLHDL, LDLDIRECT in the last 72 hours. Thyroid Function Tests: No results for input(s): TSH, T4TOTAL, FREET4, T3FREE, THYROIDAB in the last 72 hours. Anemia Panel: No results for input(s): VITAMINB12, FOLATE, FERRITIN, TIBC, IRON, RETICCTPCT in the last 72 hours. Urine analysis:    Component Value Date/Time   COLORURINE AMBER (A) 06/25/2019 1607   APPEARANCEUR TURBID (A) 06/25/2019 1607   LABSPEC 1.017 06/25/2019 1607   PHURINE 8.0 06/25/2019 1607   GLUCOSEU NEGATIVE 06/25/2019 1607   HGBUR NEGATIVE 06/25/2019 1607   BILIRUBINUR NEGATIVE 06/25/2019 1607   KETONESUR 5 (A) 06/25/2019 1607   PROTEINUR 100 (A) 06/25/2019 1607   NITRITE NEGATIVE 06/25/2019 1607   LEUKOCYTESUR LARGE (A) 06/25/2019 1607   Sepsis Labs: @LABRCNTIP (procalcitonin:4,lacticidven:4) ) Recent Results (from the past 240 hour(s))  SARS Coronavirus 2 by RT PCR (hospital order, performed in [facility] hospital lab) Nasopharyngeal Nasopharyngeal Swab     Status: None   Collection Time: 06/25/19  5:32 PM   Specimen: Nasopharyngeal Swab  Result Value Ref Range Status   SARS Coronavirus 2 NEGATIVE NEGATIVE Final    Comment: (NOTE) SARS-CoV-2 target nucleic acids are NOT DETECTED. The SARS-CoV-2 RNA is generally detectable in upper and lower respiratory specimens during the acute phase of infection. The lowest concentration of SARS-CoV-2 viral copies this assay can detect is 250 copies / mL. A negative result does not preclude SARS-CoV-2 infection and should not be used as the sole basis for treatment or  other patient management decisions.  A negative result may occur with improper specimen collection / handling, submission of specimen other than nasopharyngeal swab, presence of viral mutation(s) within the areas targeted by this assay, and inadequate number of viral copies (<250 copies / mL). A negative result must be combined with clinical observations, patient history, and epidemiological information. Fact Sheet for Patients:   StrictlyIdeas.no Fact Sheet for Healthcare Providers: BankingDealers.co.za This test is not yet approved or cleared  by the [place] FDA and has been authorized for detection and/or diagnosis of SARS-CoV-2 by FDA under an Emergency Use Authorization (EUA).  This EUA will remain in effect (meaning this test can be used) for the duration of the COVID-19 declaration under Section 564(b)(1) of the Act, 21 U.S.C. section 360bbb-3(b)(1), unless the authorization is terminated or revoked sooner. Performed at [facility], [address], [place], [place] [address]      Radiological Exams on Admission: CT ABDOMEN PELVIS WO CONTRAST  Result Date: 06/25/2019 CLINICAL DATA:  Feculent vomiting for 2 days, low-grade fever, cramping, lower abdominal pain, history hypertension, coronary artery disease post MI EXAM: CT ABDOMEN AND PELVIS WITHOUT CONTRAST TECHNIQUE: Multidetector CT imaging of the abdomen and pelvis was performed following the standard protocol without IV contrast. Sagittal and coronal MPR images reconstructed from axial data set. No oral contrast was administered. COMPARISON:  02/12/2019 FINDINGS: Lower chest: Mild bibasilar atelectasis Hepatobiliary: Gallbladder surgically absent.  Liver unremarkable. Pancreas: Normal appearance Spleen: Normal appearance Adrenals/Urinary Tract: Small peripelvic cysts LEFT kidney. Tiny nonobstructing LEFT renal calculus. Adrenal glands, kidneys, ureters, and bladder normal  appearance Stomach/Bowel: Appendix not visualized, no pericecal inflammatory process seen. Colon decompressed. Stomach unremarkable. Dilated proximal and decompressed distal small bowel loops consistent with small bowel obstruction. Obstruction is secondary to a small bowel loop within a LEFT infra umbilical hernia. Mild bowel wall thickening of the associated small bowel loop. Associated diffuse stranding of the small bowel mesentery increased from prior study. Distal small bowel loops unremarkable. Vascular/Lymphatic: Atherosclerotic calcifications aorta, iliac arteries, coronary arteries. Aorta normal caliber. No adenopathy. Reproductive: Foley catheter balloon is inflated within the inferior prostate gland, recommend deflating and advancing. Mild prostatic enlargement gland measuring 5.3 x 5.3 x 6.4 cm (volume = 94 cm^3). Seminal vesicles unremarkable. Other: In addition to LEFT infraumbilical hernia containing obstructed small bowel loop, a small LEFT supraumbilical ventral hernia is seen containing fat. No free air or free fluid. Musculoskeletal: Diffuse osseous demineralization. Mild degenerative changes of the thoracolumbar spine. IMPRESSION: Mid small bowel obstruction secondary to herniation of a small bowel loop into a LEFT infraumbilical ventral hernia, associated with mild bowel wall thickening but no evidence of perforation. Additional small LEFT supraumbilical ventral hernia containing fat. Foley catheter balloon is inflated within the inferior prostate gland; recommend replacement. Prostatic enlargement. Atherosclerotic calcifications including coronary arteries. Aortic Atherosclerosis (ICD10-I70.0). Findings called to Dr. Roderic Palau On 06/25/2019 at 1723 hrs. Electronically Signed   By: Lavonia Dana M.D.   On: 06/25/2019 17:25    Assessment/Plan: Principal Problem:   Small bowel obstruction (HCC) Active Problems:   Hyperthyroidism   Essential hypertension   CAD S/P percutaneous coronary  angioplasty   Atrial fibrillation, chronic (HCC)   AKI (acute kidney injury) (HCC)   BPH (benign prostatic hyperplasia)   GERD without esophagitis   Anticoagulated   Candidal UTI (urinary tract infection)    This patient was discussed with the ED physician, including pertinent vitals, physical exam findings, labs, and imaging.  We also discussed care given by the ED provider.  1. Small bowel obstruction a. Admit b. NG tube to low intermittent suction c. N.p.o. d. General surgery consulted and will follow 2. Candidal UTI a. Urine culture b. Start fluconazole IV c. Patient will need urinary catheter replaced 3. CAD status post angioplasty/stents, hypertension a. Continue metoprolol 4. Atrial fibrillation on chronic anticoagulation a. Metoprolol IV b. Hold cardizem. May need drip if HR starts to elevate c. Patient has digoxin on medication list, although this was supposed to be discontinued due to persistent bradycardia. Despite this, he has been taking it every day. Will hold for now. d.  Hold amiodarone. No need for IV dose if stopping for <2 weeks. e. Hold anticoagulation as patient may need surgery 5. AKI a. IV fluids b. Repeat creatinine in the morning 6. BPH a. Holding oral medications 7. GERD a. IV Protonix 8. Hyperthyroidism a. Holding methimazole  DVT prophylaxis: SCDs Consultants: General surgery Code Status: DNR Family Communication: None Disposition Plan: Pending   Truett Mainland, DO

## 2019-06-25 NOTE — ED Provider Notes (Signed)
[facility] EMERGENCY DEPARTMENT Provider Note   CSN: [id number] Arrival date & time: 06/25/19  1532     History Chief Complaint  Patient presents with  . Emesis    Derek Foster is a 84 y.o. male with a past medical history significant for CAD, hypertension, hyperlipidemia, hypothyroidism, history of MI, A. fib on Eliquis, history of small bowel obstruction, and BPH who presents to the ED due to generalized abdominal pain associated with coffee-ground emesis for the past 2 days.  Patient admits to 2-3 episodes of emesis today.  Denies diarrhea.  Last bowel movement was yesterday which was normal. Notes abdominal pain is located in his lower abdomen. Denies fever and chills. Denies shortness of breath and chest pain. Denies lightheadedness. He currently has a foley in place and denies any urinary symptoms.  Denies hematuria, melena, and hematochezia.  History obtained from patient and past medical records. No interpreter used during encounter.      Past Medical History:  Diagnosis Date  . Acute myocardial infarction, unspecified site, episode of care unspecified   . CAD in native artery    a. cath 01/27/2009 : s/p promus DES to LAD and medical managment of 70-80% mRCA  . COVID-19   . Edema   . HTN (hypertension)   . Mixed hyperlipidemia   . Unspecified hypothyroidism     Patient Active Problem List   Diagnosis Date Noted  . HCAP (healthcare-associated pneumonia) 04/11/2019  . UTI (urinary tract infection) due to urinary indwelling catheter ([place]) 04/11/2019  . Pleural effusion 04/11/2019  . Radiculopathy 04/11/2019  . PAF (paroxysmal atrial fibrillation) ([place]) 04/11/2019  . Anticoagulated 04/07/2019  . BPH (benign prostatic hyperplasia) 03/28/2019  . GERD without esophagitis 03/28/2019  . Protein-calorie malnutrition, severe ([place]) 03/28/2019  . Klebsiella cystitis 03/18/2019  . Urinary tract infection due to Proteus 03/18/2019  . Palliative care by specialist   . DNR (do not  resuscitate)   . Weakness generalized   . Grief   . Gross hematuria   . AKI (acute kidney injury) ([place]) 02/12/2019  . SBO (small bowel obstruction) ([place]) 02/12/2019  . CAD S/P percutaneous coronary angioplasty 02/11/2019  . Atrial fibrillation with RVR ([place]) 02/11/2019  . Mixed hyperlipidemia 09/10/2009  . EDEMA 02/27/2009  . Hyperthyroidism 02/26/2009  . Essential hypertension 02/26/2009  . MYOCARDIAL INFARCTION 02/26/2009    Past Surgical History:  Procedure Laterality Date  . CHOLECYSTECTOMY    . CORONARY STENT PLACEMENT    . CYSTOSCOPY/RETROGRADE/URETEROSCOPY Bilateral 03/09/2019   Procedure: CYSTOSCOPY, Clot Evacuation, Fulguration;  Surgeon: Festus Aloe, MD;  Location: [facility];  Service: Urology;  Laterality: Bilateral;  . HERNIA REPAIR         Family History  Problem Relation Age of Onset  . Heart attack Father   . Lung cancer Sister     Social History   Tobacco Use  . Smoking status: Former Smoker    Quit date: 01/20/1957    Years since quitting: 62.4  . Smokeless tobacco: Never Used  Substance Use Topics  . Alcohol use: No  . Drug use: Never    Home Medications Prior to Admission medications   Medication Sig Start Date End Date Taking? Authorizing Provider  Amino Acids-Protein Hydrolys (FEEDING SUPPLEMENT, PRO-STAT SUGAR FREE 64,) LIQD Take 30 mLs by mouth 3 (three) times daily with meals.    [provider]  amiodarone (PACERONE) 200 MG tablet Take 1 tablet (200 mg total) by mouth daily. 05/03/19   Gerlene Fee, NP  apixaban (ELIQUIS) 5 MG TABS tablet Take 1 tablet (5 mg total) by mouth 2 (two) times daily. 05/03/19   Gerlene Fee, NP  Balsam [place]-Castor Oil [facility]) OINT Apply 1 application topically in the morning, at noon, and at bedtime. Apply to buttocks and scrotum due to erythema and excoriations    [provider]  benzonatate (TESSALON) 200 MG capsule Take 1 capsule (200 mg total) by mouth every 8 (eight) hours as needed  for cough. 05/03/19   Gerlene Fee, NP  bisacodyl (DULCOLAX) 10 MG suppository Place 1 suppository (10 mg total) rectally daily as needed for severe constipation. 03/12/19   Vashti Hey, MD  Coenzyme Q10 200 MG capsule Take 200 mg by mouth daily.      [provider]  diltiazem (CARDIZEM CD) 180 MG 24 hr capsule Take 1 capsule (180 mg total) by mouth daily. 05/03/19   Gerlene Fee, NP  finasteride (PROSCAR) 5 MG tablet Take 1 tablet (5 mg total) by mouth daily. 05/03/19   Gerlene Fee, NP  fish oil-omega-3 fatty acids 1000 MG capsule Take 1 g by mouth daily.     [provider]  furosemide (LASIX) 20 MG tablet Take 2 tablets (40 mg total) by mouth daily. 05/03/19   Gerlene Fee, NP  methimazole (TAPAZOLE) 10 MG tablet Take 2 tablets (20 mg total) by mouth 2 (two) times daily. 05/03/19   Gerlene Fee, NP  methocarbamol (ROBAXIN) 750 MG tablet Take 1 tablet (750 mg total) by mouth every 8 (eight) hours as needed for muscle spasms. 05/03/19   Gerlene Fee, NP  metoprolol tartrate (LOPRESSOR) 100 MG tablet Take 0.5 tablets (50 mg total) by mouth 2 (two) times daily. 05/03/19   Gerlene Fee, NP  Multiple Vitamin (MULTIVITAMIN) tablet Take 1 tablet by mouth daily.      [provider]  nitroGLYCERIN (NITROSTAT) 0.4 MG SL tablet Place 1 tablet (0.4 mg total) under the tongue every 5 (five) minutes as needed. 05/03/19   Gerlene Fee, NP  NON FORMULARY Diet: _____ Regular, ___x___ NAS, _______Consistent Carbohydrate, _______NPO _____Other    [provider]  Nutritional Supplements (ENSURE CLEAR) LIQD Take 237 mLs by mouth daily.    [provider]  ondansetron (ZOFRAN) 4 MG tablet Take 1 tablet (4 mg total) by mouth every 6 (six) hours as needed for nausea or vomiting. 05/03/19   Gerlene Fee, NP  polyethylene glycol (MIRALAX / GLYCOLAX) 17 g packet Take 17 g by mouth daily as needed for mild constipation. 03/12/19    Vashti Hey, MD  potassium chloride (KLOR-CON) 10 MEQ tablet Take 1 tablet (10 mEq total) by mouth every evening. 05/03/19   Gerlene Fee, NP  pravastatin (PRAVACHOL) 10 MG tablet Take 1 tablet (10 mg total) by mouth daily at 6 PM. 05/03/19   Green, Phylis Bougie, NP  senna-docusate (SENOKOT-S) 8.6-50 MG tablet Take 1 tablet by mouth 2 (two) times daily. 03/12/19   Vashti Hey, MD  tamsulosin [facility]) 0.4 MG CAPS capsule Take 1 capsule (0.4 mg total) by mouth daily after breakfast. 05/03/19   Gerlene Fee, NP  simvastatin (ZOCOR) 20 MG tablet Take 1 tablet (20 mg total) by mouth every evening. 02/25/11 04/02/11  Josue Hector, MD    Allergies    Indomethacin and Iodine-131  Review of Systems   Review of Systems  Constitutional: Negative for chills and fever.  Respiratory: Negative for shortness of  breath.   Cardiovascular: Negative for chest pain.  Gastrointestinal: Positive for abdominal pain, nausea and vomiting. Negative for diarrhea.  Genitourinary: Negative for dysuria.  Neurological: Negative for light-headedness.  All other systems reviewed and are negative.   Physical Exam Updated Vital Signs BP 133/73   Pulse (!) 55   Temp (!) 97.4 F (36.3 C) (Oral)   Resp 16   SpO2 100%   Physical Exam Vitals and nursing note reviewed.  Constitutional:      General: He is not in acute distress.    Appearance: He is not ill-appearing.  HENT:     Head: Normocephalic.  Eyes:     Pupils: Pupils are equal, round, and reactive to light.  Cardiovascular:     Rate and Rhythm: Normal rate and regular rhythm.     Pulses: Normal pulses.     Heart sounds: Normal heart sounds. No murmur. No friction rub. No gallop.   Pulmonary:     Effort: Pulmonary effort is normal.     Breath sounds: Normal breath sounds.  Abdominal:     General: Abdomen is flat. Bowel sounds are normal. There is distension.     Palpations: Abdomen is soft.     Tenderness: There is  abdominal tenderness. There is guarding. There is no rebound.     Comments: Large ventral hernia inferior to umbilicus and slightly to the left. Diffuse abdominal tenderness with voluntary rebound.   Genitourinary:    Rectum: Guaiac result positive. No external hemorrhoid.  Musculoskeletal:     Cervical back: Neck supple.     Comments: No lower extremity edema.   Skin:    General: Skin is warm and dry.  Neurological:     General: No focal deficit present.     Mental Status: He is alert.  Psychiatric:        Mood and Affect: Mood normal.        Behavior: Behavior normal.     ED Results / Procedures / Treatments   Labs (all labs ordered are listed, but only abnormal results are displayed) Labs Reviewed  CBC WITH DIFFERENTIAL/PLATELET - Abnormal; Notable for the following components:      Result Value   WBC 11.8 (*)    RBC 4.01 (*)    Hemoglobin 11.2 (*)    HCT 35.1 (*)    Neutro Abs 9.2 (*)    Abs Immature Granulocytes 0.08 (*)    All other components within normal limits  COMPREHENSIVE METABOLIC PANEL - Abnormal; Notable for the following components:   Glucose, Bld 120 (*)    BUN 28 (*)    Creatinine, Ser 1.72 (*)    GFR calc non Af Amer 36 (*)    GFR calc Af Amer 42 (*)    All other components within normal limits  URINALYSIS, ROUTINE W REFLEX MICROSCOPIC - Abnormal; Notable for the following components:   Color, Urine AMBER (*)    APPearance TURBID (*)    Ketones, ur 5 (*)    Protein, ur 100 (*)    Leukocytes,Ua LARGE (*)    WBC, UA >50 (*)    Bacteria, UA RARE (*)    All other components within normal limits  POC OCCULT BLOOD, ED - Abnormal; Notable for the following components:   Fecal Occult Bld POSITIVE (*)    All other components within normal limits  SARS CORONAVIRUS 2 BY RT PCR ([facility])  URINE CULTURE  LIPASE, BLOOD  LACTIC ACID, PLASMA  POCT GASTRIC OCCULT BLOOD (1-CARD TO LAB)    EKG None  Radiology  CT ABDOMEN PELVIS WO CONTRAST  Result Date: 06/25/2019 CLINICAL DATA:  Feculent vomiting for 2 days, low-grade fever, cramping, lower abdominal pain, history hypertension, coronary artery disease post MI EXAM: CT ABDOMEN AND PELVIS WITHOUT CONTRAST TECHNIQUE: Multidetector CT imaging of the abdomen and pelvis was performed following the standard protocol without IV contrast. Sagittal and coronal MPR images reconstructed from axial data set. No oral contrast was administered. COMPARISON:  02/12/2019 FINDINGS: Lower chest: Mild bibasilar atelectasis Hepatobiliary: Gallbladder surgically absent.  Liver unremarkable. Pancreas: Normal appearance Spleen: Normal appearance Adrenals/Urinary Tract: Small peripelvic cysts LEFT kidney. Tiny nonobstructing LEFT renal calculus. Adrenal glands, kidneys, ureters, and bladder normal appearance Stomach/Bowel: Appendix not visualized, no pericecal inflammatory process seen. Colon decompressed. Stomach unremarkable. Dilated proximal and decompressed distal small bowel loops consistent with small bowel obstruction. Obstruction is secondary to a small bowel loop within a LEFT infra umbilical hernia. Mild bowel wall thickening of the associated small bowel loop. Associated diffuse stranding of the small bowel mesentery increased from prior study. Distal small bowel loops unremarkable. Vascular/Lymphatic: Atherosclerotic calcifications aorta, iliac arteries, coronary arteries. Aorta normal caliber. No adenopathy. Reproductive: Foley catheter balloon is inflated within the inferior prostate gland, recommend deflating and advancing. Mild prostatic enlargement gland measuring 5.3 x 5.3 x 6.4 cm (volume = 94 cm^3). Seminal vesicles unremarkable. Other: In addition to LEFT infraumbilical hernia containing obstructed small bowel loop, a small LEFT supraumbilical ventral hernia is seen containing fat. No free air or free fluid. Musculoskeletal: Diffuse osseous demineralization. Mild  degenerative changes of the thoracolumbar spine. IMPRESSION: Mid small bowel obstruction secondary to herniation of a small bowel loop into a LEFT infraumbilical ventral hernia, associated with mild bowel wall thickening but no evidence of perforation. Additional small LEFT supraumbilical ventral hernia containing fat. Foley catheter balloon is inflated within the inferior prostate gland; recommend replacement. Prostatic enlargement. Atherosclerotic calcifications including coronary arteries. Aortic Atherosclerosis (ICD10-I70.0). Findings called to Dr. Roderic Palau On 06/25/2019 at 1723 hrs. Electronically Signed   By: Lavonia Dana M.D.   On: 06/25/2019 17:25    Procedures Procedures (including critical care time)  Medications Ordered in ED Medications  sodium chloride 0.9 % bolus 500 mL (500 mLs Intravenous New Bag/Given 06/25/19 1644)  ondansetron (ZOFRAN) injection 4 mg (4 mg Intravenous Given 06/25/19 1645)  pantoprazole (PROTONIX) injection 40 mg (40 mg Intravenous Given 06/25/19 1722)    ED Course  I have reviewed the triage vital signs and the nursing notes.  Pertinent labs & imaging results that were available during my care of the patient were reviewed by me and considered in my medical decision making (see chart for details).  Clinical Course as of Jun 24 1809  Sat Jun 25, 2019  1706 Patient allergic to IV contrast dye. Will obtain CT without contrast.    [CA]  1727 Discussed case with Dr. Constance Haw with general surgery who will follow patient on the floor. Will place NG tube per recommendation. COVID test ordered.    [CA]  18 Spoke to patient's daughter on the phone to update her on results and admission to the hospital for further treatment.    [CA]  1736 Fecal Occult Blood, POC(!): POSITIVE [CA]  1806 Discussed case with Dr. Nehemiah Settle with [facility] who agrees to admit patient for further treatment.    [CA]  1807 Spoke to Dr. Constance Haw on the phone again after final CT report finalized to  discuss  that the SBO is secondary to a hernia. She recommends attempting to reduce hernia after NG tube placement. Hold Eliquis tonight and tomorrow AM just in case patient needs to go to the OR.    [CA]    Clinical Course User Index [CA] Suzy Bouchard, PA-C   MDM Rules/Calculators/A&P                     84 year old male presents to the ED due to coffee-ground emesis for the past 2 days.  Patient is currently on Eliquis for A. fib.  Denies hematuria, hematochezia, melena.  He admits to generalized abdominal pain. Stable vitals. Patient in no acute distress and non-toxic appearing. Abdomen soft with mild distention and generalized abdominal tenderness and voluntary guarding. Will obtain routine labs and CT abdomen to rule out acute abdomen given patient's history of small bowel obstruction.  Patient given IV fluids, Zofran, and Protonix here in the ED for symptomatic relief.  CBC significant for mild leukocytosis at 11.8 and anemia with hemoglobin at 11.2 which appears higher than baseline.  CMP significant for AKI with creatinine at 1.72 and BUN at 28, but otherwise reassuring.  Lipase normal at 18.  Doubt pancreatitis.  UA significant for large leukocytes and rare bacteria.  Will send off for urine culture for further evaluation of possible acute cystitis.  No hematuria.  Fecal occult positive.  CT abdomen personally reviewed which demonstrates:   IMPRESSION:  Mid small bowel obstruction secondary to herniation of a small bowel  loop into a LEFT infraumbilical ventral hernia, associated with mild  bowel wall thickening but no evidence of perforation.    Additional small LEFT supraumbilical ventral hernia containing fat.    Foley catheter balloon is inflated within the inferior prostate  gland; recommend replacement.    Prostatic enlargement.    Atherosclerotic calcifications including coronary arteries.    Aortic Atherosclerosis (ICD10-I70.0).   Discussed case with Dr. Constance Haw with  general surgery.  See note above.  NG tube placed here in the ED.  Discussed case with Dr. Nehemiah Settle with [facility] who agrees to admit patient for further treatment.  Final Clinical Impression(s) / ED Diagnoses Final diagnoses:  Small bowel obstruction [facility])    Rx / DC Orders ED Discharge Orders    None       Karie Kirks 06/25/19 Diamantina Monks, MD 06/26/19 1627

## 2019-06-25 NOTE — ED Notes (Signed)
Protonix not available in pyxis (grayed out) and does not allow over-ride.  Supervisor (Tim) to bring medication.

## 2019-06-25 NOTE — ED Notes (Signed)
Pt has foley in place from home, draining yellow urine.

## 2019-06-25 NOTE — ED Triage Notes (Addendum)
Brought in by EMS form home.  EMS reports pt's daughter-in-law is Nurse from [employer] and reports pt has vomited fecal for last 2 days.  Temp 100.0, CBG 135 and BP 198/72 per EMS.  Pt c/o lower abdominal pain (cramping) rating pain 3/10.

## 2019-06-25 NOTE — ED Notes (Addendum)
Called AC to bring eye drops

## 2019-06-25 NOTE — ED Notes (Signed)
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

## 2019-06-25 NOTE — ED Notes (Signed)
Hospitalist and EDP plan to medicate before inserting NGT.  Place to reduce hernia.

## 2019-06-25 NOTE — ED Provider Notes (Signed)
Patient with abdominal hernia. I attempted to reduce the hernia. Possible part of the hernia was reduced but did not stay that way   Milton Ferguson, MD 06/25/19 2021

## 2019-06-26 ENCOUNTER — Inpatient Hospital Stay (HOSPITAL_COMMUNITY): Payer: PPO

## 2019-06-26 DIAGNOSIS — K436 Other and unspecified ventral hernia with obstruction, without gangrene: Secondary | ICD-10-CM

## 2019-06-26 LAB — BASIC METABOLIC PANEL
Anion gap: 9 (ref 5–15)
BUN: 28 mg/dL — ABNORMAL HIGH (ref 8–23)
CO2: 28 mmol/L (ref 22–32)
Calcium: 8.3 mg/dL — ABNORMAL LOW (ref 8.9–10.3)
Chloride: 104 mmol/L (ref 98–111)
Creatinine, Ser: 1.63 mg/dL — ABNORMAL HIGH (ref 0.61–1.24)
GFR calc Af Amer: 45 mL/min — ABNORMAL LOW (ref >60)
GFR calc non Af Amer: 39 mL/min — ABNORMAL LOW (ref >60)
Glucose, Bld: 94 mg/dL (ref 70–99)
Potassium: 3.4 mmol/L — ABNORMAL LOW (ref 3.5–5.1)
Sodium: 141 mmol/L (ref 135–145)

## 2019-06-26 LAB — CBC
HCT: 31.9 % — ABNORMAL LOW (ref 39.0–52.0)
Hemoglobin: 10.1 g/dL — ABNORMAL LOW (ref 13.0–17.0)
MCH: 27.7 pg (ref 26.0–34.0)
MCHC: 31.7 g/dL (ref 30.0–36.0)
MCV: 87.6 fL (ref 80.0–100.0)
Platelets: 221 10*3/uL (ref 150–400)
RBC: 3.64 MIL/uL — ABNORMAL LOW (ref 4.22–5.81)
RDW: 14.9 % (ref 11.5–15.5)
WBC: 10 10*3/uL (ref 4.0–10.5)
nRBC: 0 % (ref 0.0–0.2)

## 2019-06-26 LAB — GLUCOSE, CAPILLARY: Glucose-Capillary: 91 mg/dL (ref 70–99)

## 2019-06-26 MED ORDER — METHIMAZOLE 5 MG PO TABS
20.0000 mg | ORAL_TABLET | Freq: Two times a day (BID) | ORAL | Status: DC
  Administered 2019-06-26 – 2019-07-04 (×16): 20 mg via ORAL
  Filled 2019-06-26: qty 2
  Filled 2019-06-26: qty 4
  Filled 2019-06-26: qty 2
  Filled 2019-06-26 (×8): qty 4
  Filled 2019-06-26 (×4): qty 2

## 2019-06-26 MED ORDER — HYDRALAZINE HCL 20 MG/ML IJ SOLN: 10.0000 mg | Freq: Once | INTRAMUSCULAR | Status: DC

## 2019-06-26 MED ORDER — CHLORHEXIDINE GLUCONATE CLOTH 2 % EX PADS
6.0000 | MEDICATED_PAD | Freq: Every day | CUTANEOUS | Status: DC
  Administered 2019-06-26 – 2019-07-02 (×6): 6 via TOPICAL

## 2019-06-26 MED ORDER — NYSTATIN 100000 UNIT/GM EX POWD
Freq: Three times a day (TID) | CUTANEOUS | Status: DC
  Filled 2019-06-26 (×5): qty 15

## 2019-06-26 MED ORDER — TAMSULOSIN HCL 0.4 MG PO CAPS
0.4000 mg | ORAL_CAPSULE | Freq: Every day | ORAL | Status: DC
  Administered 2019-06-27 – 2019-07-04 (×8): 0.4 mg via ORAL
  Filled 2019-06-26 (×8): qty 1

## 2019-06-26 NOTE — Progress Notes (Signed)
NGT removed without difficulty. 1ml of light bile colored drainage noted in collection cannister. Pt given ice water and advised to take sips of fluids initially. Advised that clear liquid diet ordered. Pt and son state understanding.

## 2019-06-26 NOTE — Plan of Care (Signed)
  Problem: Skin Integrity: Goal: Risk for impaired skin integrity will decrease Outcome: Progressing   Problem: Safety: Goal: Ability to remain free from injury will improve Outcome: Progressing   Problem: Pain Managment: Goal: General experience of comfort will improve Outcome: Progressing   Problem: Coping: Goal: Level of anxiety will decrease Outcome: Progressing   Problem: Nutrition: Goal: Adequate nutrition will be maintained Outcome: Progressing   Problem: Education: Goal: Knowledge of General Education information will improve Description: Including pain rating scale, medication(s)/side effects and non-pharmacologic comfort measures Outcome: Progressing

## 2019-06-26 NOTE — Progress Notes (Signed)
Pt has not had any further BM's since this morning but is passing gas frequently. Abd is soft, bowel sounds (+) x4. Pt denies n/v or abd pain. NGT intact, minimal bile colored drainage noted in tube. MD notified of current assessment and order received to remove NGT.

## 2019-06-26 NOTE — Consult Note (Signed)
[facility] Consult  Reason for Consult: Ventral hernia with SBO  Referring Physician:  Dr. Roderic Palau (ED), Dr. Zigmund Daniel (Hospitalist)   Chief Complaint    Emesis      HPI: Derek Foster is a 84 y.o. male with multiple medical issues including CAD, prior MI with stent in 2011, prior COVID infection January, multiple hospitalizations in the last few months for COVID with SBO from hernia, new onset A fib started on Eliquis, and another admission for PNA, and multiple admission to rehab.  He says in the last 5 weeks being home he has mostly been in bed. He reported that he started having lower abdominal pain and coffee ground emesis for the past two days prior to admission. He had a normal BM the day before admission. He denied any recent chest pain or Sob but has not been out of bed much.    He was evaluated in the ED and found to have a SBO from the ventral hernia which the ED was able to partially reduce.  Today he has had a liquid BM.   Past Medical History:  Diagnosis Date  . Acute myocardial infarction, unspecified site, episode of care unspecified   . CAD in native artery    a. cath 01/27/2009 : s/p promus DES to LAD and medical managment of 70-80% mRCA  . COVID-19   . Edema   . HTN (hypertension)   . Mixed hyperlipidemia   . Unspecified hypothyroidism     Past Surgical History:  Procedure Laterality Date  . CHOLECYSTECTOMY    . CORONARY STENT PLACEMENT    . CYSTOSCOPY/RETROGRADE/URETEROSCOPY Bilateral 03/09/2019   Procedure: CYSTOSCOPY, Clot Evacuation, Fulguration;  Surgeon: Festus Aloe, MD;  Location: [place];  Service: Urology;  Laterality: Bilateral;  . HERNIA REPAIR      Family History  Problem Relation Age of Onset  . Heart attack Father   . Lung cancer Sister     Social History   Tobacco Use  . Smoking status: Former Smoker    Quit date: 01/20/1957    Years since quitting: 62.4  . Smokeless tobacco: Never Used  Substance Use Topics  .  Alcohol use: No  . Drug use: Never    Medications: I have reviewed the patient's current medications. Current Facility-Administered Medications  Medication Dose Route Frequency Provider Last Rate Last Admin  . 0.9 %  sodium chloride infusion   Intravenous Continuous Truett Mainland, DO 100 mL/hr at 06/25/19 1826 New Bag at 06/25/19 1826  . Chlorhexidine Gluconate Cloth 2 % PADS 6 each  6 each Topical Daily Bunnie Pion Z, DO   6 each at 06/26/19 0814  . fluconazole (DIFLUCAN) IVPB 100 mg  100 mg Intravenous Q24H Stinson, Jacob J, DO      . hydrALAZINE (APRESOLINE) injection 10 mg  10 mg Intravenous Once Bunnie Pion Z, DO      . metoprolol tartrate (LOPRESSOR) injection 5 mg  5 mg Intravenous Q6H Truett Mainland, DO   Stopped at 06/26/19 0100  . morphine 2 MG/ML injection 2 mg  2 mg Intravenous Q2H PRN Truett Mainland, DO      . ofloxacin (OCUFLOX) 0.3 % ophthalmic solution 1 drop  1 drop Both Eyes QID Truett Mainland, DO   1 drop at 06/26/19 0916  . ondansetron (ZOFRAN) tablet 4 mg  4 mg Oral Q6H PRN Truett Mainland, DO       Or  . ondansetron [facility]) injection 4 mg  4 mg Intravenous Q6H PRN Truett Mainland, DO      . pantoprazole (PROTONIX) injection 40 mg  40 mg Intravenous Q12H Truett Mainland, DO   40 mg at 06/26/19 0920    Allergies  Allergen Reactions  . Indomethacin Anaphylaxis    But tolerates ibuprofen, aleve  . Iodine-131 Anaphylaxis     ROS:  A comprehensive review of systems was negative except for: Gastrointestinal: positive for abdominal pain, nausea, vomiting and coffee ground emesis   Blood pressure (!) 131/55, pulse (!) 50, temperature 98.4 F (36.9 C), temperature source Oral, resp. rate 16, height 6' (1.829 m), weight 119 kg, SpO2 98 %. Physical Exam Vitals reviewed.  Constitutional:      Appearance: Normal appearance.  HENT:     Head: Normocephalic and atraumatic.     Comments: Bandage on left ear    Nose: Nose normal.     Mouth/Throat:      Mouth: Mucous membranes are moist.  Eyes:     Pupils: Pupils are equal, round, and reactive to light.  Cardiovascular:     Rate and Rhythm: Normal rate.  Pulmonary:     Effort: Pulmonary effort is normal.  Abdominal:     General: There is no distension.     Palpations: Abdomen is soft.     Tenderness: There is no abdominal tenderness.     Hernia: A hernia is present.     Comments: Reducible infra umbilical / periumbilical hernia with minor tenderness   Musculoskeletal:        General: No swelling.     Cervical back: Neck supple.  Skin:    General: Skin is warm and dry.  Neurological:     General: No focal deficit present.     Mental Status: He is alert and oriented to person, place, and time.  Psychiatric:        Mood and Affect: Mood normal.        Behavior: Behavior normal.        Thought Content: Thought content normal.        Judgment: Judgment normal.     Results: Results for orders placed or performed during the hospital encounter of 06/25/19 (from the past 48 hour(s))  CBC with Differential     Status: Abnormal   Collection Time: 06/25/19  4:00 PM  Result Value Ref Range   WBC 11.8 (H) 4.0 - 10.5 K/uL   RBC 4.01 (L) 4.22 - 5.81 MIL/uL   Hemoglobin 11.2 (L) 13.0 - 17.0 g/dL   HCT 35.1 (L) 39.0 - 52.0 %   MCV 87.5 80.0 - 100.0 fL   MCH 27.9 26.0 - 34.0 pg   MCHC 31.9 30.0 - 36.0 g/dL   RDW 14.9 11.5 - 15.5 %   Platelets 247 150 - 400 K/uL   nRBC 0.0 0.0 - 0.2 %   Neutrophils Relative % 78 %   Neutro Abs 9.2 (H) 1.7 - 7.7 K/uL   Lymphocytes Relative 13 %   Lymphs Abs 1.6 0.7 - 4.0 K/uL   Monocytes Relative 8 %   Monocytes Absolute 1.0 0.1 - 1.0 K/uL   Eosinophils Relative 0 %   Eosinophils Absolute 0.0 0.0 - 0.5 K/uL   Basophils Relative 0 %   Basophils Absolute 0.0 0.0 - 0.1 K/uL   Immature Granulocytes 1 %   Abs Immature Granulocytes 0.08 (H) 0.00 - 0.07 K/uL    Comment: Performed at [facility], [address], [place], [place] [address]  Comprehensive  metabolic panel     Status: Abnormal   Collection Time: 06/25/19  4:00 PM  Result Value Ref Range   Sodium 140 135 - 145 mmol/L   Potassium 3.8 3.5 - 5.1 mmol/L   Chloride 99 98 - 111 mmol/L   CO2 29 22 - 32 mmol/L   Glucose, Bld 120 (H) 70 - 99 mg/dL    Comment: Glucose reference range applies only to samples taken after fasting for at least 8 hours.   BUN 28 (H) 8 - 23 mg/dL   Creatinine, Ser 1.72 (H) 0.61 - 1.24 mg/dL   Calcium 9.0 8.9 - 10.3 mg/dL   Total Protein 7.0 6.5 - 8.1 g/dL   Albumin 4.1 3.5 - 5.0 g/dL   AST 15 15 - 41 U/L   ALT 13 0 - 44 U/L   Alkaline Phosphatase 89 38 - 126 U/L   Total Bilirubin 0.9 0.3 - 1.2 mg/dL   GFR calc non Af Amer 36 (L) >60 mL/min   GFR calc Af Amer 42 (L) >60 mL/min   Anion gap 12 5 - 15    Comment: Performed at [facility], [address] [place], [place] [address]  Lipase, blood     Status: None   Collection Time: 06/25/19  4:00 PM  Result Value Ref Range   Lipase 18 11 - 51 U/L    Comment: Performed at [facility], [address], [place], [place] [address]  Lactic acid, plasma     Status: None   Collection Time: 06/25/19  4:00 PM  Result Value Ref Range   Lactic Acid, Venous 1.2 0.5 - 1.9 mmol/L    Comment: Performed at [facility], [address], [place], [place] [address]  Urinalysis, Routine w reflex microscopic     Status: Abnormal   Collection Time: 06/25/19  4:07 PM  Result Value Ref Range   Color, Urine AMBER (A) YELLOW    Comment: BIOCHEMICALS MAY BE AFFECTED BY COLOR   APPearance TURBID (A) CLEAR   Specific Gravity, Urine 1.017 1.005 - 1.030   pH 8.0 5.0 - 8.0   Glucose, UA NEGATIVE NEGATIVE mg/dL   Hgb urine dipstick NEGATIVE NEGATIVE   Bilirubin Urine NEGATIVE NEGATIVE   Ketones, ur 5 (A) NEGATIVE mg/dL   Protein, ur 100 (A) NEGATIVE mg/dL   Nitrite NEGATIVE NEGATIVE   Leukocytes,Ua LARGE (A) NEGATIVE   RBC / HPF 6-10 0 - 5 RBC/hpf   WBC, UA >50 (H) 0 - 5 WBC/hpf   Bacteria, UA RARE (A) NONE SEEN   Squamous  Epithelial / LPF 0-5 0 - 5   WBC Clumps PRESENT    Mucus PRESENT    Budding Yeast PRESENT    Triple Phosphate Crystal PRESENT     Comment: Performed at [facility], [address] [place],  [address]  POC occult blood, ED Provider will collect     Status: Abnormal   Collection Time: 06/25/19  5:28 PM  Result Value Ref Range   Fecal Occult Bld POSITIVE (A) NEGATIVE  SARS Coronavirus 2 by RT PCR (hospital order, performed in [place] hospital lab) Nasopharyngeal Nasopharyngeal Swab     Status: None   Collection Time: 06/25/19  5:32 PM   Specimen: Nasopharyngeal Swab  Result Value Ref Range   SARS Coronavirus 2 NEGATIVE NEGATIVE    Comment: (NOTE) SARS-CoV-2 target nucleic acids are NOT DETECTED. The SARS-CoV-2 RNA is generally detectable in upper and lower respiratory specimens during the acute phase  of infection. The lowest concentration of SARS-CoV-2 viral copies this assay can detect is 250 copies / mL. A negative result does not preclude SARS-CoV-2 infection and should not be used as the sole basis for treatment or other patient management decisions.  A negative result may occur with improper specimen collection / handling, submission of specimen other than nasopharyngeal swab, presence of viral mutation(s) within the areas targeted by this assay, and inadequate number of viral copies (<250 copies / mL). A negative result must be combined with clinical observations, patient history, and epidemiological information. Fact Sheet for Patients:   StrictlyIdeas.no Fact Sheet for Healthcare Providers: BankingDealers.co.za This test is not yet approved or cleared  by the [place] FDA and has been authorized for detection and/or diagnosis of SARS-CoV-2 by FDA under an Emergency Use Authorization (EUA).  This EUA will remain in effect (meaning this test can be used) for the duration of the COVID-19 declaration under Section  564(b)(1) of the Act, 21 U.S.C. section 360bbb-3(b)(1), unless the authorization is terminated or revoked sooner. Performed at [facility], [address], [place], [place] [address]   Basic metabolic panel     Status: Abnormal   Collection Time: 06/26/19  4:14 AM  Result Value Ref Range   Sodium 141 135 - 145 mmol/L   Potassium 3.4 (L) 3.5 - 5.1 mmol/L   Chloride 104 98 - 111 mmol/L   CO2 28 22 - 32 mmol/L   Glucose, Bld 94 70 - 99 mg/dL    Comment: Glucose reference range applies only to samples taken after fasting for at least 8 hours.   BUN 28 (H) 8 - 23 mg/dL   Creatinine, Ser 1.63 (H) 0.61 - 1.24 mg/dL   Calcium 8.3 (L) 8.9 - 10.3 mg/dL   GFR calc non Af Amer 39 (L) >60 mL/min   GFR calc Af Amer 45 (L) >60 mL/min   Anion gap 9 5 - 15    Comment: Performed at [facility], [address], [place], [place] [address]  CBC     Status: Abnormal   Collection Time: 06/26/19  4:14 AM  Result Value Ref Range   WBC 10.0 4.0 - 10.5 K/uL   RBC 3.64 (L) 4.22 - 5.81 MIL/uL   Hemoglobin 10.1 (L) 13.0 - 17.0 g/dL   HCT 31.9 (L) 39.0 - 52.0 %   MCV 87.6 80.0 - 100.0 fL   MCH 27.7 26.0 - 34.0 pg   MCHC 31.7 30.0 - 36.0 g/dL   RDW 14.9 11.5 - 15.5 %   Platelets 221 150 - 400 K/uL   nRBC 0.0 0.0 - 0.2 %    Comment: Performed at [facility], [address], [place], [place] [address]   Personally reviewed CT- ventral hernia noted left supraumbilical with fat, fascia defect about 2cm, peri- infraumbilical left of midline hernia with bowel, fascia defect 4X4.5 cm CT ABDOMEN PELVIS WO CONTRAST  Result Date: 06/25/2019 CLINICAL DATA:  Feculent vomiting for 2 days, low-grade fever, cramping, lower abdominal pain, history hypertension, coronary artery disease post MI EXAM: CT ABDOMEN AND PELVIS WITHOUT CONTRAST TECHNIQUE: Multidetector CT imaging of the abdomen and pelvis was performed following the standard protocol without IV contrast. Sagittal and coronal MPR images reconstructed from axial data  set. No oral contrast was administered. COMPARISON:  02/12/2019 FINDINGS: Lower chest: Mild bibasilar atelectasis Hepatobiliary: Gallbladder surgically absent.  Liver unremarkable. Pancreas: Normal appearance Spleen: Normal appearance Adrenals/Urinary Tract: Small peripelvic cysts LEFT kidney. Tiny nonobstructing LEFT renal calculus.  Adrenal glands, kidneys, ureters, and bladder normal appearance Stomach/Bowel: Appendix not visualized, no pericecal inflammatory process seen. Colon decompressed. Stomach unremarkable. Dilated proximal and decompressed distal small bowel loops consistent with small bowel obstruction. Obstruction is secondary to a small bowel loop within a LEFT infra umbilical hernia. Mild bowel wall thickening of the associated small bowel loop. Associated diffuse stranding of the small bowel mesentery increased from prior study. Distal small bowel loops unremarkable. Vascular/Lymphatic: Atherosclerotic calcifications aorta, iliac arteries, coronary arteries. Aorta normal caliber. No adenopathy. Reproductive: Foley catheter balloon is inflated within the inferior prostate gland, recommend deflating and advancing. Mild prostatic enlargement gland measuring 5.3 x 5.3 x 6.4 cm (volume = 94 cm^3). Seminal vesicles unremarkable. Other: In addition to LEFT infraumbilical hernia containing obstructed small bowel loop, a small LEFT supraumbilical ventral hernia is seen containing fat. No free air or free fluid. Musculoskeletal: Diffuse osseous demineralization. Mild degenerative changes of the thoracolumbar spine. IMPRESSION: Mid small bowel obstruction secondary to herniation of a small bowel loop into a LEFT infraumbilical ventral hernia, associated with mild bowel wall thickening but no evidence of perforation. Additional small LEFT supraumbilical ventral hernia containing fat. Foley catheter balloon is inflated within the inferior prostate gland; recommend replacement. Prostatic enlargement.  Atherosclerotic calcifications including coronary arteries. Aortic Atherosclerosis (ICD10-I70.0). Findings called to Dr. Roderic Palau On 06/25/2019 at 1723 hrs. Electronically Signed   By: Lavonia Dana M.D.   On: 06/25/2019 17:25   DG Chest Portable 1 View  Result Date: 06/25/2019 CLINICAL DATA:  Advancement of nasogastric tube. EXAM: PORTABLE CHEST 1 VIEW COMPARISON:  Earlier this day. FINDINGS: Tip of the enteric tube is below the diaphragm in the stomach in the left upper quadrant. The side-port is not well visualized, but likely below the diaphragm. Low lung volumes. Cardiomegaly is unchanged. No focal airspace disease. IMPRESSION: Tip of the enteric tube below the diaphragm in the stomach in the left upper quadrant. Electronically Signed   By: Keith Rake M.D.   On: 06/25/2019 21:22   DG Chest Portable 1 View  Result Date: 06/25/2019 CLINICAL DATA:  NGT placement EXAM: PORTABLE CHEST 1 VIEW COMPARISON:  04/11/2019 FINDINGS: Cardiomegaly. Esophagogastric tube is positioned with tip over the lower esophagus, although poorly visualized due to underpenetration. Both lungs are clear. The visualized skeletal structures are unremarkable. IMPRESSION: Esophagogastric tube is positioned with tip over the lower esophagus, although poorly visualized due to underpenetration. Recommend advancement to ensure subdiaphragmatic positioning of tip and side port. Electronically Signed   By: Eddie Candle M.D.   On: 06/25/2019 20:15     Assessment & Plan:  ROCHESTER SERPE is a 84 y.o. male with a reduced ventral hernia s/p ventral hernia repair 25 years ago with mesh. This is his second admission for SBO associated with the hernia. This is reducible and he did have a liquid BM today.  He has had multiple medical issues in the last few months and new onset Afib on Eliquis. Eliquis is being held now given potential for surgery.  I cannot explain the hematemesis from the obstruction other than just irritation from vomiting  itself. He is also hemoccult positive. NG without any coffee grounds at this time. Anemic on labs also.  He has records from [facility] of a normal colonoscopy in 2009 in our system.   Would get GI to weigh in on utility of EGD at some point.    Ventral hernia with recurrent obstruction, the patient needs this repaired but given his multiple issues and hospitalizations  the timing and location need to be carefully determined.  Discussed with the patient.  Cardiology for risk stratification and will base decision for surgery here or referral / transfer elsewhere based on their thoughts and continued discussion with patient and hospitalist.    No urgent need for surgery as the hernia is reduced.  KUB ordered for today. NG in place for now until has more BMs or KUB returns with improved bowel gas pattern.   All questions were answered to the satisfaction of the patient.  Updated Dr. Zigmund Daniel and RN. Appreciate everyone's assistance with this patient.   Virl Cagey 06/26/2019, 8:52 AM

## 2019-06-26 NOTE — Progress Notes (Signed)
PROGRESS NOTE    Derek Foster  OIZ:[id number] DOB: 02-07-1935 DOA: 06/25/2019 PCP: Celene Squibb, MD    Brief Narrative:per dr Sherrlyn Hock is a 84 y.o. male with a history of CAD with history of MI and stenting, A. fib on chronic anticoagulation, hypertension, hyperlipidemia, hypothyroidism, GERD, chronic indwelling urinary catheter, ventral hernia.  Patient presents to the hospital with 2 days of generalized lower abdominal pain with coffee-ground emesis with a couple of episodes today.  No diarrhea.  Reports normal bowel movement yesterday.  No hematuria, melena, hematochezia.  Emergency Department Course: CT of the abdomen/pelvis shows small bowel obstructions coming from ventral hernia with loop of bowel in the hernia.  General surgery consulted. NG tube placed.  Assessment & Plan:   Principal Problem:   Small bowel obstruction (HCC) Active Problems:   Hyperthyroidism   Essential hypertension   CAD S/P percutaneous coronary angioplasty   Atrial fibrillation, chronic (HCC)   AKI (acute kidney injury) (HCC)   BPH (benign prostatic hyperplasia)   GERD without esophagitis   Anticoagulated   Candidal UTI (urinary tract infection)   Ventral hernia with bowel obstruction  #1 small bowel obstruction secondary to ventral hernia with recurrent obstruction which was reduced in the ER and by Dr. Constance Haw. KUB 06/26/2019 significant improvement in small bowel obstruction.   #2 history of A. fib was on Eliquis which is being on hold for possible surgery Tuesday or Wednesday.  His heart rate is in the mid 50s to high 40s.  Amiodarone and Cardizem have not been restarted due to this.  Consult cardiology in a.m. for preop clearance.  #3 history of CAD status post stent LAD PCI in 2014.  Ejection fraction 60 to 65%.  #4 AKI creatinine 1.6 down from 1.7 on admission.  Continue IV fluids  #5 history of BPH restart home medications Flomax and finasteride  #6 history of GERD on  Protonix  #7 history of hyper thyroidism on methimazole restarted  #8 coffee-ground emesis reported by the patient check a Gastroccult and FOBT.  Hemoglobin 10.1 down from 11.2 with no evidence of active bleeding hemodilution could be playing a role too.  Consult GI if Hemoccult or FOBT comes back positive.   Pressure Injury 03/12/19 Buttocks Left Stage 2 -  Partial thickness loss of dermis presenting as a shallow open injury with a red, pink wound bed without slough. (Active)  03/12/19 0924  Location: Buttocks  Location Orientation: Left  Staging: Stage 2 -  Partial thickness loss of dermis presenting as a shallow open injury with a red, pink wound bed without slough.  Wound Description (Comments):   Present on Admission:     Estimated body mass index is 35.58 kg/m as calculated from the following:   Height as of this encounter: 6' (1.829 m).   Weight as of this encounter: 119 kg.  DVT prophylaxis: SCD Eliquis on hold for surgery. Code Status: Full code Family Communication no family at bedside Disposition Plan:  Status is: Inpatient Dispo: The patient is from:              Anticipated d/c is to:              Anticipated d/c date is:              Patient currently is not medically stable to d/c.  Patient admitted with recurrent bowel obstruction secondary to hernia ventral hernia with NG tube in place for possible surgery next week  Consultants: General surgery  Procedures NG tube 06/25/2019 Antimicrobials: None Subjective: Resting in bed in no apparent distress  Objective: Foley catheter in place Vitals:   06/25/19 2330 06/26/19 0459 06/26/19 0800 06/26/19 1322  BP: (!) 176/59 (!) 135/50 (!) 131/55 (!) 152/47  Pulse: (!) 56 (!) 53 (!) 50 (!) 54  Resp: 16 16 16 17   Temp: 97.7 F (36.5 C) 98.5 F (36.9 C) 98.4 F (36.9 C) 98.1 F (36.7 C)  TempSrc: Oral Oral Oral Oral  SpO2: 98% 96% 98% 97%  Weight:      Height:        Intake/Output Summary (Last 24 hours) at  06/26/2019 1339 Last data filed at 06/26/2019 0900 Gross per 24 hour  Intake 850 ml  Output 450 ml  Net 400 ml   Filed Weights   06/25/19 2330  Weight: 119 kg    Examination:  General exam: Appears calm and comfortable  Respiratory system: Clear to auscultation. Respiratory effort normal. Cardiovascular system: S1 & S2 heard, RRR. No JVD, murmurs, rubs, gallops or clicks. No pedal edema. Gastrointestinal system: Abdomen is nondistended, soft and mild tender. No organomegaly or masses felt. Normal bowel sounds heard. Central nervous system: Alert and oriented. No focal neurological deficits. Extremities: Symmetric 5 x 5 power. Skin: No rashes, lesions or ulcers Psychiatry: Judgement and insight appear normal. Mood & affect appropriate.     Data Reviewed: I have personally reviewed following labs and imaging studies  CBC: Recent Labs  Lab 06/25/19 1600 06/26/19 0414  WBC 11.8* 10.0  NEUTROABS 9.2*  --   HGB 11.2* 10.1*  HCT 35.1* 31.9*  MCV 87.5 87.6  PLT 247 591   Basic Metabolic Panel: Recent Labs  Lab 06/25/19 1600 06/26/19 0414  NA 140 141  K 3.8 3.4*  CL 99 104  CO2 29 28  GLUCOSE 120* 94  BUN 28* 28*  CREATININE 1.72* 1.63*  CALCIUM 9.0 8.3*   GFR: Estimated Creatinine Clearance: 46.6 mL/min (A) (by C-G formula based on SCr of 1.63 mg/dL (H)). Liver Function Tests: Recent Labs  Lab 06/25/19 1600  AST 15  ALT 13  ALKPHOS 89  BILITOT 0.9  PROT 7.0  ALBUMIN 4.1   Recent Labs  Lab 06/25/19 1600  LIPASE 18   No results for input(s): AMMONIA in the last 168 hours. Coagulation Profile: No results for input(s): INR, PROTIME in the last 168 hours. Cardiac Enzymes: No results for input(s): CKTOTAL, CKMB, CKMBINDEX, TROPONINI in the last 168 hours. BNP (last 3 results) No results for input(s): PROBNP in the last 8760 hours. HbA1C: No results for input(s): HGBA1C in the last 72 hours. CBG: Recent Labs  Lab 06/26/19 1058  GLUCAP 91   Lipid  Profile: No results for input(s): CHOL, HDL, LDLCALC, TRIG, CHOLHDL, LDLDIRECT in the last 72 hours. Thyroid Function Tests: No results for input(s): TSH, T4TOTAL, FREET4, T3FREE, THYROIDAB in the last 72 hours. Anemia Panel: No results for input(s): VITAMINB12, FOLATE, FERRITIN, TIBC, IRON, RETICCTPCT in the last 72 hours. Sepsis Labs: Recent Labs  Lab 06/25/19 1600  LATICACIDVEN 1.2    Recent Results (from the past 240 hour(s))  SARS Coronavirus 2 by RT PCR (hospital order, performed in [facility] hospital lab) Nasopharyngeal Nasopharyngeal Swab     Status: None   Collection Time: 06/25/19  5:32 PM   Specimen: Nasopharyngeal Swab  Result Value Ref Range Status   SARS Coronavirus 2 NEGATIVE NEGATIVE Final    Comment: (NOTE) SARS-CoV-2 target nucleic acids are  NOT DETECTED. The SARS-CoV-2 RNA is generally detectable in upper and lower respiratory specimens during the acute phase of infection. The lowest concentration of SARS-CoV-2 viral copies this assay can detect is 250 copies / mL. A negative result does not preclude SARS-CoV-2 infection and should not be used as the sole basis for treatment or other patient management decisions.  A negative result may occur with improper specimen collection / handling, submission of specimen other than nasopharyngeal swab, presence of viral mutation(s) within the areas targeted by this assay, and inadequate number of viral copies (<250 copies / mL). A negative result must be combined with clinical observations, patient history, and epidemiological information. Fact Sheet for Patients:   StrictlyIdeas.no Fact Sheet for Healthcare Providers: BankingDealers.co.za This test is not yet approved or cleared  by the [place] FDA and has been authorized for detection and/or diagnosis of SARS-CoV-2 by FDA under an Emergency Use Authorization (EUA).  This EUA will remain in effect (meaning this test  can be used) for the duration of the COVID-19 declaration under Section 564(b)(1) of the Act, 21 U.S.C. section 360bbb-3(b)(1), unless the authorization is terminated or revoked sooner. Performed at [facility], [address], [place], [place] [address]          Radiology Studies: CT ABDOMEN PELVIS WO CONTRAST  Result Date: 06/25/2019 CLINICAL DATA:  Feculent vomiting for 2 days, low-grade fever, cramping, lower abdominal pain, history hypertension, coronary artery disease post MI EXAM: CT ABDOMEN AND PELVIS WITHOUT CONTRAST TECHNIQUE: Multidetector CT imaging of the abdomen and pelvis was performed following the standard protocol without IV contrast. Sagittal and coronal MPR images reconstructed from axial data set. No oral contrast was administered. COMPARISON:  02/12/2019 FINDINGS: Lower chest: Mild bibasilar atelectasis Hepatobiliary: Gallbladder surgically absent.  Liver unremarkable. Pancreas: Normal appearance Spleen: Normal appearance Adrenals/Urinary Tract: Small peripelvic cysts LEFT kidney. Tiny nonobstructing LEFT renal calculus. Adrenal glands, kidneys, ureters, and bladder normal appearance Stomach/Bowel: Appendix not visualized, no pericecal inflammatory process seen. Colon decompressed. Stomach unremarkable. Dilated proximal and decompressed distal small bowel loops consistent with small bowel obstruction. Obstruction is secondary to a small bowel loop within a LEFT infra umbilical hernia. Mild bowel wall thickening of the associated small bowel loop. Associated diffuse stranding of the small bowel mesentery increased from prior study. Distal small bowel loops unremarkable. Vascular/Lymphatic: Atherosclerotic calcifications aorta, iliac arteries, coronary arteries. Aorta normal caliber. No adenopathy. Reproductive: Foley catheter balloon is inflated within the inferior prostate gland, recommend deflating and advancing. Mild prostatic enlargement gland measuring 5.3 x 5.3 x 6.4 cm  (volume = 94 cm^3). Seminal vesicles unremarkable. Other: In addition to LEFT infraumbilical hernia containing obstructed small bowel loop, a small LEFT supraumbilical ventral hernia is seen containing fat. No free air or free fluid. Musculoskeletal: Diffuse osseous demineralization. Mild degenerative changes of the thoracolumbar spine. IMPRESSION: Mid small bowel obstruction secondary to herniation of a small bowel loop into a LEFT infraumbilical ventral hernia, associated with mild bowel wall thickening but no evidence of perforation. Additional small LEFT supraumbilical ventral hernia containing fat. Foley catheter balloon is inflated within the inferior prostate gland; recommend replacement. Prostatic enlargement. Atherosclerotic calcifications including coronary arteries. Aortic Atherosclerosis (ICD10-I70.0). Findings called to Dr. Roderic Palau On 06/25/2019 at 1723 hrs. Electronically Signed   By: Lavonia Dana M.D.   On: 06/25/2019 17:25   DG Abd 1 View  Result Date: 06/26/2019 CLINICAL DATA:  Abdominal pain, small-bowel obstruction EXAM: ABDOMEN - 1 VIEW COMPARISON:  CT abdomen pelvis, 06/25/2019 FINDINGS: Single supine  abdominal radiograph excludes the upper abdomen and portions of the left and right lateral abdomen. Within this limitation, gas-filled, nondistended loops of small bowel in the included central abdomen, with scattered gas present in the portions of the included colon to the rectum. IMPRESSION: Single supine abdominal radiograph excludes the upper abdomen and portions of the left and right lateral abdomen. Within this limitation, gas-filled, nondistended loops of small bowel in the included central abdomen, with scattered gas present in the portions of the included colon to the rectum. This examination suggests significant resolution of previously noted small bowel obstruction. Electronically Signed   By: Eddie Candle M.D.   On: 06/26/2019 12:41   DG Chest Portable 1 View  Result Date:  06/25/2019 CLINICAL DATA:  Advancement of nasogastric tube. EXAM: PORTABLE CHEST 1 VIEW COMPARISON:  Earlier this day. FINDINGS: Tip of the enteric tube is below the diaphragm in the stomach in the left upper quadrant. The side-port is not well visualized, but likely below the diaphragm. Low lung volumes. Cardiomegaly is unchanged. No focal airspace disease. IMPRESSION: Tip of the enteric tube below the diaphragm in the stomach in the left upper quadrant. Electronically Signed   By: Keith Rake M.D.   On: 06/25/2019 21:22   DG Chest Portable 1 View  Result Date: 06/25/2019 CLINICAL DATA:  NGT placement EXAM: PORTABLE CHEST 1 VIEW COMPARISON:  04/11/2019 FINDINGS: Cardiomegaly. Esophagogastric tube is positioned with tip over the lower esophagus, although poorly visualized due to underpenetration. Both lungs are clear. The visualized skeletal structures are unremarkable. IMPRESSION: Esophagogastric tube is positioned with tip over the lower esophagus, although poorly visualized due to underpenetration. Recommend advancement to ensure subdiaphragmatic positioning of tip and side port. Electronically Signed   By: Eddie Candle M.D.   On: 06/25/2019 20:15        Scheduled Meds: . Chlorhexidine Gluconate Cloth  6 each Topical Daily  . hydrALAZINE  10 mg Intravenous Once  . metoprolol tartrate  5 mg Intravenous Q6H  . ofloxacin  1 drop Both Eyes QID  . pantoprazole (PROTONIX) IV  40 mg Intravenous Q12H   Continuous Infusions: . sodium chloride 100 mL/hr at 06/25/19 1826  . fluconazole (DIFLUCAN) IV       LOS: 1 day     Georgette Shell, MD 06/26/2019, 1:39 PM

## 2019-06-26 NOTE — H&P (View-Only) (Signed)
[facility] Consult  Reason for Consult: Ventral hernia with SBO  Referring Physician:  Dr. Roderic Palau (ED), Dr. Zigmund Daniel (Hospitalist)   Chief Complaint    Emesis      HPI: Derek Foster is a 84 y.o. male with multiple medical issues including CAD, prior MI with stent in 2011, prior COVID infection January, multiple hospitalizations in the last few months for COVID with SBO from hernia, new onset A fib started on Eliquis, and another admission for PNA, and multiple admission to rehab.  He says in the last 5 weeks being home he has mostly been in bed. He reported that he started having lower abdominal pain and coffee ground emesis for the past two days prior to admission. He had a normal BM the day before admission. He denied any recent chest pain or Sob but has not been out of bed much.    He was evaluated in the ED and found to have a SBO from the ventral hernia which the ED was able to partially reduce.  Today he has had a liquid BM.   Past Medical History:  Diagnosis Date  . Acute myocardial infarction, unspecified site, episode of care unspecified   . CAD in native artery    a. cath 01/27/2009 : s/p promus DES to LAD and medical managment of 70-80% mRCA  . COVID-19   . Edema   . HTN (hypertension)   . Mixed hyperlipidemia   . Unspecified hypothyroidism     Past Surgical History:  Procedure Laterality Date  . CHOLECYSTECTOMY    . CORONARY STENT PLACEMENT    . CYSTOSCOPY/RETROGRADE/URETEROSCOPY Bilateral 03/09/2019   Procedure: CYSTOSCOPY, Clot Evacuation, Fulguration;  Surgeon: Festus Aloe, MD;  Location: [place];  Service: Urology;  Laterality: Bilateral;  . HERNIA REPAIR      Family History  Problem Relation Age of Onset  . Heart attack Father   . Lung cancer Sister     Social History   Tobacco Use  . Smoking status: Former Smoker    Quit date: 01/20/1957    Years since quitting: 62.4  . Smokeless tobacco: Never Used  Substance Use Topics  .  Alcohol use: No  . Drug use: Never    Medications: I have reviewed the patient's current medications. Current Facility-Administered Medications  Medication Dose Route Frequency Provider Last Rate Last Admin  . 0.9 %  sodium chloride infusion   Intravenous Continuous Truett Mainland, DO 100 mL/hr at 06/25/19 1826 New Bag at 06/25/19 1826  . Chlorhexidine Gluconate Cloth 2 % PADS 6 each  6 each Topical Daily Bunnie Pion Z, DO   6 each at 06/26/19 4540  . fluconazole (DIFLUCAN) IVPB 100 mg  100 mg Intravenous Q24H Stinson, Jacob J, DO      . hydrALAZINE (APRESOLINE) injection 10 mg  10 mg Intravenous Once Bunnie Pion Z, DO      . metoprolol tartrate (LOPRESSOR) injection 5 mg  5 mg Intravenous Q6H Truett Mainland, DO   Stopped at 06/26/19 0100  . morphine 2 MG/ML injection 2 mg  2 mg Intravenous Q2H PRN Truett Mainland, DO      . ofloxacin (OCUFLOX) 0.3 % ophthalmic solution 1 drop  1 drop Both Eyes QID Truett Mainland, DO   1 drop at 06/26/19 0916  . ondansetron (ZOFRAN) tablet 4 mg  4 mg Oral Q6H PRN Truett Mainland, DO       Or  . ondansetron [facility]) injection 4 mg  4 mg Intravenous Q6H PRN Truett Mainland, DO      . pantoprazole (PROTONIX) injection 40 mg  40 mg Intravenous Q12H Truett Mainland, DO   40 mg at 06/26/19 0920    Allergies  Allergen Reactions  . Indomethacin Anaphylaxis    But tolerates ibuprofen, aleve  . Iodine-131 Anaphylaxis     ROS:  A comprehensive review of systems was negative except for: Gastrointestinal: positive for abdominal pain, nausea, vomiting and coffee ground emesis   Blood pressure (!) 131/55, pulse (!) 50, temperature 98.4 F (36.9 C), temperature source Oral, resp. rate 16, height 6' (1.829 m), weight 119 kg, SpO2 98 %. Physical Exam Vitals reviewed.  Constitutional:      Appearance: Normal appearance.  HENT:     Head: Normocephalic and atraumatic.     Comments: Bandage on left ear    Nose: Nose normal.     Mouth/Throat:      Mouth: Mucous membranes are moist.  Eyes:     Pupils: Pupils are equal, round, and reactive to light.  Cardiovascular:     Rate and Rhythm: Normal rate.  Pulmonary:     Effort: Pulmonary effort is normal.  Abdominal:     General: There is no distension.     Palpations: Abdomen is soft.     Tenderness: There is no abdominal tenderness.     Hernia: A hernia is present.     Comments: Reducible infra umbilical / periumbilical hernia with minor tenderness   Musculoskeletal:        General: No swelling.     Cervical back: Neck supple.  Skin:    General: Skin is warm and dry.  Neurological:     General: No focal deficit present.     Mental Status: He is alert and oriented to person, place, and time.  Psychiatric:        Mood and Affect: Mood normal.        Behavior: Behavior normal.        Thought Content: Thought content normal.        Judgment: Judgment normal.     Results: Results for orders placed or performed during the hospital encounter of 06/25/19 (from the past 48 hour(s))  CBC with Differential     Status: Abnormal   Collection Time: 06/25/19  4:00 PM  Result Value Ref Range   WBC 11.8 (H) 4.0 - 10.5 K/uL   RBC 4.01 (L) 4.22 - 5.81 MIL/uL   Hemoglobin 11.2 (L) 13.0 - 17.0 g/dL   HCT 35.1 (L) 39.0 - 52.0 %   MCV 87.5 80.0 - 100.0 fL   MCH 27.9 26.0 - 34.0 pg   MCHC 31.9 30.0 - 36.0 g/dL   RDW 14.9 11.5 - 15.5 %   Platelets 247 150 - 400 K/uL   nRBC 0.0 0.0 - 0.2 %   Neutrophils Relative % 78 %   Neutro Abs 9.2 (H) 1.7 - 7.7 K/uL   Lymphocytes Relative 13 %   Lymphs Abs 1.6 0.7 - 4.0 K/uL   Monocytes Relative 8 %   Monocytes Absolute 1.0 0.1 - 1.0 K/uL   Eosinophils Relative 0 %   Eosinophils Absolute 0.0 0.0 - 0.5 K/uL   Basophils Relative 0 %   Basophils Absolute 0.0 0.0 - 0.1 K/uL   Immature Granulocytes 1 %   Abs Immature Granulocytes 0.08 (H) 0.00 - 0.07 K/uL    Comment: Performed at [facility], [address], [place], [place] [address]  Comprehensive  metabolic panel     Status: Abnormal   Collection Time: 06/25/19  4:00 PM  Result Value Ref Range   Sodium 140 135 - 145 mmol/L   Potassium 3.8 3.5 - 5.1 mmol/L   Chloride 99 98 - 111 mmol/L   CO2 29 22 - 32 mmol/L   Glucose, Bld 120 (H) 70 - 99 mg/dL    Comment: Glucose reference range applies only to samples taken after fasting for at least 8 hours.   BUN 28 (H) 8 - 23 mg/dL   Creatinine, Ser 1.72 (H) 0.61 - 1.24 mg/dL   Calcium 9.0 8.9 - 10.3 mg/dL   Total Protein 7.0 6.5 - 8.1 g/dL   Albumin 4.1 3.5 - 5.0 g/dL   AST 15 15 - 41 U/L   ALT 13 0 - 44 U/L   Alkaline Phosphatase 89 38 - 126 U/L   Total Bilirubin 0.9 0.3 - 1.2 mg/dL   GFR calc non Af Amer 36 (L) >60 mL/min   GFR calc Af Amer 42 (L) >60 mL/min   Anion gap 12 5 - 15    Comment: Performed at [facility], [address] [place], [place] [address]  Lipase, blood     Status: None   Collection Time: 06/25/19  4:00 PM  Result Value Ref Range   Lipase 18 11 - 51 U/L    Comment: Performed at [facility], [address] [place], [place] [address]  Lactic acid, plasma     Status: None   Collection Time: 06/25/19  4:00 PM  Result Value Ref Range   Lactic Acid, Venous 1.2 0.5 - 1.9 mmol/L    Comment: Performed at [facility], [address], [place], [place] [address]  Urinalysis, Routine w reflex microscopic     Status: Abnormal   Collection Time: 06/25/19  4:07 PM  Result Value Ref Range   Color, Urine AMBER (A) YELLOW    Comment: BIOCHEMICALS MAY BE AFFECTED BY COLOR   APPearance TURBID (A) CLEAR   Specific Gravity, Urine 1.017 1.005 - 1.030   pH 8.0 5.0 - 8.0   Glucose, UA NEGATIVE NEGATIVE mg/dL   Hgb urine dipstick NEGATIVE NEGATIVE   Bilirubin Urine NEGATIVE NEGATIVE   Ketones, ur 5 (A) NEGATIVE mg/dL   Protein, ur 100 (A) NEGATIVE mg/dL   Nitrite NEGATIVE NEGATIVE   Leukocytes,Ua LARGE (A) NEGATIVE   RBC / HPF 6-10 0 - 5 RBC/hpf   WBC, UA >50 (H) 0 - 5 WBC/hpf   Bacteria, UA RARE (A) NONE SEEN   Squamous  Epithelial / LPF 0-5 0 - 5   WBC Clumps PRESENT    Mucus PRESENT    Budding Yeast PRESENT    Triple Phosphate Crystal PRESENT     Comment: Performed at [facility], [address] [place], [place] [address]  POC occult blood, ED Provider will collect     Status: Abnormal   Collection Time: 06/25/19  5:28 PM  Result Value Ref Range   Fecal Occult Bld POSITIVE (A) NEGATIVE  SARS Coronavirus 2 by RT PCR (hospital order, performed in [place] hospital lab) Nasopharyngeal Nasopharyngeal Swab     Status: None   Collection Time: 06/25/19  5:32 PM   Specimen: Nasopharyngeal Swab  Result Value Ref Range   SARS Coronavirus 2 NEGATIVE NEGATIVE    Comment: (NOTE) SARS-CoV-2 target nucleic acids are NOT DETECTED. The SARS-CoV-2 RNA is generally detectable in upper and lower respiratory specimens during the acute phase  of infection. The lowest concentration of SARS-CoV-2 viral copies this assay can detect is 250 copies / mL. A negative result does not preclude SARS-CoV-2 infection and should not be used as the sole basis for treatment or other patient management decisions.  A negative result may occur with improper specimen collection / handling, submission of specimen other than nasopharyngeal swab, presence of viral mutation(s) within the areas targeted by this assay, and inadequate number of viral copies (<250 copies / mL). A negative result must be combined with clinical observations, patient history, and epidemiological information. Fact Sheet for Patients:   StrictlyIdeas.no Fact Sheet for Healthcare Providers: BankingDealers.co.za This test is not yet approved or cleared  by the [place] FDA and has been authorized for detection and/or diagnosis of SARS-CoV-2 by FDA under an Emergency Use Authorization (EUA).  This EUA will remain in effect (meaning this test can be used) for the duration of the COVID-19 declaration under Section  564(b)(1) of the Act, 21 U.S.C. section 360bbb-3(b)(1), unless the authorization is terminated or revoked sooner. Performed at [facility], [address], [place], [place] [address]   Basic metabolic panel     Status: Abnormal   Collection Time: 06/26/19  4:14 AM  Result Value Ref Range   Sodium 141 135 - 145 mmol/L   Potassium 3.4 (L) 3.5 - 5.1 mmol/L   Chloride 104 98 - 111 mmol/L   CO2 28 22 - 32 mmol/L   Glucose, Bld 94 70 - 99 mg/dL    Comment: Glucose reference range applies only to samples taken after fasting for at least 8 hours.   BUN 28 (H) 8 - 23 mg/dL   Creatinine, Ser 1.63 (H) 0.61 - 1.24 mg/dL   Calcium 8.3 (L) 8.9 - 10.3 mg/dL   GFR calc non Af Amer 39 (L) >60 mL/min   GFR calc Af Amer 45 (L) >60 mL/min   Anion gap 9 5 - 15    Comment: Performed at [facility], [address], [place], [place] [address]  CBC     Status: Abnormal   Collection Time: 06/26/19  4:14 AM  Result Value Ref Range   WBC 10.0 4.0 - 10.5 K/uL   RBC 3.64 (L) 4.22 - 5.81 MIL/uL   Hemoglobin 10.1 (L) 13.0 - 17.0 g/dL   HCT 31.9 (L) 39.0 - 52.0 %   MCV 87.6 80.0 - 100.0 fL   MCH 27.7 26.0 - 34.0 pg   MCHC 31.7 30.0 - 36.0 g/dL   RDW 14.9 11.5 - 15.5 %   Platelets 221 150 - 400 K/uL   nRBC 0.0 0.0 - 0.2 %    Comment: Performed at [facility], [address], [place],  [address]   Personally reviewed CT- ventral hernia noted left supraumbilical with fat, fascia defect about 2cm, peri- infraumbilical left of midline hernia with bowel, fascia defect 4X4.5 cm CT ABDOMEN PELVIS WO CONTRAST  Result Date: 06/25/2019 CLINICAL DATA:  Feculent vomiting for 2 days, low-grade fever, cramping, lower abdominal pain, history hypertension, coronary artery disease post MI EXAM: CT ABDOMEN AND PELVIS WITHOUT CONTRAST TECHNIQUE: Multidetector CT imaging of the abdomen and pelvis was performed following the standard protocol without IV contrast. Sagittal and coronal MPR images reconstructed from axial data  set. No oral contrast was administered. COMPARISON:  02/12/2019 FINDINGS: Lower chest: Mild bibasilar atelectasis Hepatobiliary: Gallbladder surgically absent.  Liver unremarkable. Pancreas: Normal appearance Spleen: Normal appearance Adrenals/Urinary Tract: Small peripelvic cysts LEFT kidney. Tiny nonobstructing LEFT renal calculus.  Adrenal glands, kidneys, ureters, and bladder normal appearance Stomach/Bowel: Appendix not visualized, no pericecal inflammatory process seen. Colon decompressed. Stomach unremarkable. Dilated proximal and decompressed distal small bowel loops consistent with small bowel obstruction. Obstruction is secondary to a small bowel loop within a LEFT infra umbilical hernia. Mild bowel wall thickening of the associated small bowel loop. Associated diffuse stranding of the small bowel mesentery increased from prior study. Distal small bowel loops unremarkable. Vascular/Lymphatic: Atherosclerotic calcifications aorta, iliac arteries, coronary arteries. Aorta normal caliber. No adenopathy. Reproductive: Foley catheter balloon is inflated within the inferior prostate gland, recommend deflating and advancing. Mild prostatic enlargement gland measuring 5.3 x 5.3 x 6.4 cm (volume = 94 cm^3). Seminal vesicles unremarkable. Other: In addition to LEFT infraumbilical hernia containing obstructed small bowel loop, a small LEFT supraumbilical ventral hernia is seen containing fat. No free air or free fluid. Musculoskeletal: Diffuse osseous demineralization. Mild degenerative changes of the thoracolumbar spine. IMPRESSION: Mid small bowel obstruction secondary to herniation of a small bowel loop into a LEFT infraumbilical ventral hernia, associated with mild bowel wall thickening but no evidence of perforation. Additional small LEFT supraumbilical ventral hernia containing fat. Foley catheter balloon is inflated within the inferior prostate gland; recommend replacement. Prostatic enlargement.  Atherosclerotic calcifications including coronary arteries. Aortic Atherosclerosis (ICD10-I70.0). Findings called to Dr. Roderic Palau On 06/25/2019 at 1723 hrs. Electronically Signed   By: Lavonia Dana M.D.   On: 06/25/2019 17:25   DG Chest Portable 1 View  Result Date: 06/25/2019 CLINICAL DATA:  Advancement of nasogastric tube. EXAM: PORTABLE CHEST 1 VIEW COMPARISON:  Earlier this day. FINDINGS: Tip of the enteric tube is below the diaphragm in the stomach in the left upper quadrant. The side-port is not well visualized, but likely below the diaphragm. Low lung volumes. Cardiomegaly is unchanged. No focal airspace disease. IMPRESSION: Tip of the enteric tube below the diaphragm in the stomach in the left upper quadrant. Electronically Signed   By: Keith Rake M.D.   On: 06/25/2019 21:22   DG Chest Portable 1 View  Result Date: 06/25/2019 CLINICAL DATA:  NGT placement EXAM: PORTABLE CHEST 1 VIEW COMPARISON:  04/11/2019 FINDINGS: Cardiomegaly. Esophagogastric tube is positioned with tip over the lower esophagus, although poorly visualized due to underpenetration. Both lungs are clear. The visualized skeletal structures are unremarkable. IMPRESSION: Esophagogastric tube is positioned with tip over the lower esophagus, although poorly visualized due to underpenetration. Recommend advancement to ensure subdiaphragmatic positioning of tip and side port. Electronically Signed   By: Eddie Candle M.D.   On: 06/25/2019 20:15     Assessment & Plan:  NOAL ABSHIER is a 84 y.o. male with a reduced ventral hernia s/p ventral hernia repair 25 years ago with mesh. This is his second admission for SBO associated with the hernia. This is reducible and he did have a liquid BM today.  He has had multiple medical issues in the last few months and new onset Afib on Eliquis. Eliquis is being held now given potential for surgery.  I cannot explain the hematemesis from the obstruction other than just irritation from vomiting  itself. He is also hemoccult positive. NG without any coffee grounds at this time. Anemic on labs also.  He has records from [place] of a normal colonoscopy in 2009 in our system.   Would get GI to weigh in on utility of EGD at some point.    Ventral hernia with recurrent obstruction, the patient needs this repaired but given his multiple issues and hospitalizations  the timing and location need to be carefully determined.  Discussed with the patient.  Cardiology for risk stratification and will base decision for surgery here or referral / transfer elsewhere based on their thoughts and continued discussion with patient and hospitalist.    No urgent need for surgery as the hernia is reduced.  KUB ordered for today. NG in place for now until has more BMs or KUB returns with improved bowel gas pattern.   All questions were answered to the satisfaction of the patient.  Updated Dr. Zigmund Daniel and RN. Appreciate everyone's assistance with this patient.   Virl Cagey 06/26/2019, 8:52 AM

## 2019-06-27 ENCOUNTER — Inpatient Hospital Stay (HOSPITAL_COMMUNITY): Payer: PPO

## 2019-06-27 ENCOUNTER — Encounter (HOSPITAL_COMMUNITY): Payer: Self-pay | Admitting: Cardiology

## 2019-06-27 DIAGNOSIS — Z01810 Encounter for preprocedural cardiovascular examination: Secondary | ICD-10-CM

## 2019-06-27 LAB — SURGICAL PCR SCREEN
MRSA, PCR: NEGATIVE
Staphylococcus aureus: NEGATIVE

## 2019-06-27 MED ORDER — CHLORHEXIDINE GLUCONATE CLOTH 2 % EX PADS
6.0000 | MEDICATED_PAD | Freq: Once | CUTANEOUS | Status: AC
  Administered 2019-06-28: 6 via TOPICAL

## 2019-06-27 MED ORDER — CHLORHEXIDINE GLUCONATE CLOTH 2 % EX PADS
6.0000 | MEDICATED_PAD | Freq: Once | CUTANEOUS | Status: AC
  Administered 2019-06-27: 6 via TOPICAL

## 2019-06-27 MED ORDER — SODIUM CHLORIDE 0.9 % IV SOLN
1.0000 g | INTRAVENOUS | Status: DC
  Administered 2019-06-27: 1 g via INTRAVENOUS
  Filled 2019-06-27 (×2): qty 10

## 2019-06-27 MED ORDER — AMIODARONE HCL 200 MG PO TABS
200.0000 mg | ORAL_TABLET | Freq: Every day | ORAL | Status: DC
  Administered 2019-06-27 – 2019-07-04 (×8): 200 mg via ORAL
  Filled 2019-06-27 (×8): qty 1

## 2019-06-27 MED ORDER — TRAZODONE HCL 50 MG PO TABS
25.0000 mg | ORAL_TABLET | Freq: Every day | ORAL | Status: AC
  Administered 2019-06-27 – 2019-06-29 (×3): 25 mg via ORAL
  Filled 2019-06-27 (×3): qty 1

## 2019-06-27 MED ORDER — CEFAZOLIN SODIUM-DEXTROSE 2-4 GM/100ML-% IV SOLN
2.0000 g | INTRAVENOUS | Status: AC
  Administered 2019-06-28: 2 g via INTRAVENOUS
  Filled 2019-06-27: qty 100

## 2019-06-27 NOTE — Progress Notes (Addendum)
PROGRESS NOTE    [facility]  UMP:[id number] DOB: 01-15-1936 DOA: 06/25/2019 PCP: Celene Squibb, MD    Brief Narrative:Derek Foster a 84 y.o.malewith a history of CAD with history of MI and stenting, A. fib on chronic anticoagulation, hypertension, hyperlipidemia, hypothyroidism, GERD, chronic indwelling urinary catheter, ventral hernia. Patient presents to the hospital with 2 days of generalized lower abdominal pain with coffee-ground emesis with a couple of episodes today. No diarrhea. Reports normal bowel movement yesterday. No hematuria, melena, hematochezia.  Emergency Department Course: CT of the abdomen/pelvis shows small bowel obstructions coming from ventral hernia with loop of bowel in the hernia. General surgery consulted.NG tube placed.  Assessment & Plan:   Principal Problem:   Small bowel obstruction (HCC) Active Problems:   Hyperthyroidism   Essential hypertension   CAD S/P percutaneous coronary angioplasty   Atrial fibrillation, chronic (HCC)   AKI (acute kidney injury) (HCC)   BPH (benign prostatic hyperplasia)   GERD without esophagitis   Anticoagulated   Candidal UTI (urinary tract infection)   Ventral hernia with bowel obstruction   #1 small bowel obstruction secondary to ventral hernia with recurrent obstruction which was reduced in the ER and by Dr. Constance Haw. KUB 06/26/2019 significant improvement in small bowel obstruction. Planning for surgery Tuesday or Wednesday.  Appreciate preop consult from cardiology.  #2 history of A. fib was on Eliquis which is being on hold for possible surgery Tuesday or Wednesday.  His heart rate is in the mid 50s to high 40s.  Restarted amiodarone.  Restart Eliquis after surgery.  #3 history of CAD status post stent LAD PCI in 2014.  Ejection fraction 60 to 65%.  #4 AKI creatinine 1.6 down from 1.7 on admission.  Continue IV fluids  #5 history of BPH restart home medications Flomax and finasteride  #6 history  of GERD on Protonix  #7 history of hyper thyroidism on methimazole restarted  #8 coffee-ground emesis reported by the patient check a Gastroccult and FOBT.  Hemoglobin 10.1 down from 11.2 with no evidence of active bleeding hemodilution could be playing a role too.   FOBT is positive however he is  not having any active bleeding will have her to follow-up with GI as an outpatient.  #9 Proteus UTI sensitivities are pending we will start Rocephin.   Pressure Injury 03/12/19 Buttocks Left Stage 2 -  Partial thickness loss of dermis presenting as a shallow open injury with a red, pink wound bed without slough. (Active)  03/12/19 0924  Location: Buttocks  Location Orientation: Left  Staging: Stage 2 -  Partial thickness loss of dermis presenting as a shallow open injury with a red, pink wound bed without slough.  Wound Description (Comments):   Present on Admission:     Estimated body mass index is 35.58 kg/m as calculated from the following:   Height as of this encounter: 6' (1.829 m).   Weight as of this encounter: 119 kg.  DVT prophylaxis: SCD Eliquis on hold for surgery. Code Status: Full code Family Communication no family at bedside Disposition Plan:  Status is: Inpatient Dispo: The patient is from:  Anticipated d/c is to:  Anticipated d/c date is:  Patient currently is not medically stable to d/c.  Patient admitted with recurrent bowel obstruction secondary to hernia ventral hernia with NG tube in place for possible surgery next week    Consultants: General surgery  Procedures NG tube 06/25/2019 Antimicrobials:  Subjective: Patient resting in bed tolerating clear liquids Objective: Vitals:  06/26/19 2134 06/27/19 0500 06/27/19 0756 06/27/19 1303  BP: (!) 151/49 (!) 149/51  (!) 161/45  Pulse: (!) 51 (!) 55  (!) 54  Resp: 16 16  19   Temp: 99 F (37.2 C) 98.9 F (37.2 C)  98.5 F (36.9 C)  TempSrc: Oral Oral    SpO2: 98% 96%  97% 98%  Weight:      Height:        Intake/Output Summary (Last 24 hours) at 06/27/2019 1402 Last data filed at 06/27/2019 1300 Gross per 24 hour  Intake 2370 ml  Output --  Net 2370 ml   Filed Weights   06/25/19 2330  Weight: 119 kg    Examination:  General exam: Appears calm and comfortable  Respiratory system: Clear to auscultation. Respiratory effort normal. Cardiovascular system: S1 & S2 heard, RRR. No JVD, murmurs, rubs, gallops or clicks. No pedal edema. Gastrointestinal system: Abdomen is nondistended, soft and nontender. No organomegaly or masses felt. Normal bowel sounds heard. Central nervous system: Alert and oriented. No focal neurological deficits. Extremities: Symmetric 5 x 5 power. Skin: No rashes, lesions or ulcers Psychiatry: Judgement and insight appear normal. Mood & affect appropriate.     Data Reviewed: I have personally reviewed following labs and imaging studies  CBC: Recent Labs  Lab 06/25/19 1600 06/26/19 0414  WBC 11.8* 10.0  NEUTROABS 9.2*  --   HGB 11.2* 10.1*  HCT 35.1* 31.9*  MCV 87.5 87.6  PLT 247 703   Basic Metabolic Panel: Recent Labs  Lab 06/25/19 1600 06/26/19 0414  NA 140 141  K 3.8 3.4*  CL 99 104  CO2 29 28  GLUCOSE 120* 94  BUN 28* 28*  CREATININE 1.72* 1.63*  CALCIUM 9.0 8.3*   GFR: Estimated Creatinine Clearance: 46.6 mL/min (A) (by C-G formula based on SCr of 1.63 mg/dL (H)). Liver Function Tests: Recent Labs  Lab 06/25/19 1600  AST 15  ALT 13  ALKPHOS 89  BILITOT 0.9  PROT 7.0  ALBUMIN 4.1   Recent Labs  Lab 06/25/19 1600  LIPASE 18   No results for input(s): AMMONIA in the last 168 hours. Coagulation Profile: No results for input(s): INR, PROTIME in the last 168 hours. Cardiac Enzymes: No results for input(s): CKTOTAL, CKMB, CKMBINDEX, TROPONINI in the last 168 hours. BNP (last 3 results) No results for input(s): PROBNP in the last 8760 hours. HbA1C: No results for input(s): HGBA1C in the  last 72 hours. CBG: Recent Labs  Lab 06/26/19 1058  GLUCAP 91   Lipid Profile: No results for input(s): CHOL, HDL, LDLCALC, TRIG, CHOLHDL, LDLDIRECT in the last 72 hours. Thyroid Function Tests: No results for input(s): TSH, T4TOTAL, FREET4, T3FREE, THYROIDAB in the last 72 hours. Anemia Panel: No results for input(s): VITAMINB12, FOLATE, FERRITIN, TIBC, IRON, RETICCTPCT in the last 72 hours. Sepsis Labs: Recent Labs  Lab 06/25/19 1600  LATICACIDVEN 1.2    Recent Results (from the past 240 hour(s))  SARS Coronavirus 2 by RT PCR (hospital order, performed in [facility] hospital lab) Nasopharyngeal Nasopharyngeal Swab     Status: None   Collection Time: 06/25/19  5:32 PM   Specimen: Nasopharyngeal Swab  Result Value Ref Range Status   SARS Coronavirus 2 NEGATIVE NEGATIVE Final    Comment: (NOTE) SARS-CoV-2 target nucleic acids are NOT DETECTED. The SARS-CoV-2 RNA is generally detectable in upper and lower respiratory specimens during the acute phase of infection. The lowest concentration of SARS-CoV-2 viral copies this assay can detect is 250 copies /  mL. A negative result does not preclude SARS-CoV-2 infection and should not be used as the sole basis for treatment or other patient management decisions.  A negative result may occur with improper specimen collection / handling, submission of specimen other than nasopharyngeal swab, presence of viral mutation(s) within the areas targeted by this assay, and inadequate number of viral copies (<250 copies / mL). A negative result must be combined with clinical observations, patient history, and epidemiological information. Fact Sheet for Patients:   StrictlyIdeas.no Fact Sheet for Healthcare Providers: BankingDealers.co.za This test is not yet approved or cleared  by the [place] FDA and has been authorized for detection and/or diagnosis of SARS-CoV-2 by FDA under an Emergency  Use Authorization (EUA).  This EUA will remain in effect (meaning this test can be used) for the duration of the COVID-19 declaration under Section 564(b)(1) of the Act, 21 U.S.C. section 360bbb-3(b)(1), unless the authorization is terminated or revoked sooner. Performed at [facility], [address] [place], [place] [address]   Urine culture     Status: Abnormal (Preliminary result)   Collection Time: 06/25/19  5:59 PM   Specimen: Urine, Catheterized  Result Value Ref Range Status   Specimen Description   Final    URINE, CATHETERIZED Performed at [facility], [address], [place], [place] [address]    Special Requests   Final    NONE Performed at [facility], [address], [place], [place] [address]    Culture (A)  Final    >=100,000 COLONIES/mL PROTEUS MIRABILIS SUSCEPTIBILITIES TO FOLLOW Performed at [facility], [place] [address], [place], [place] [address]    Report Status PENDING  Incomplete         Radiology Studies: CT ABDOMEN PELVIS WO CONTRAST  Result Date: 06/25/2019 CLINICAL DATA:  Feculent vomiting for 2 days, low-grade fever, cramping, lower abdominal pain, history hypertension, coronary artery disease post MI EXAM: CT ABDOMEN AND PELVIS WITHOUT CONTRAST TECHNIQUE: Multidetector CT imaging of the abdomen and pelvis was performed following the standard protocol without IV contrast. Sagittal and coronal MPR images reconstructed from axial data set. No oral contrast was administered. COMPARISON:  02/12/2019 FINDINGS: Lower chest: Mild bibasilar atelectasis Hepatobiliary: Gallbladder surgically absent.  Liver unremarkable. Pancreas: Normal appearance Spleen: Normal appearance Adrenals/Urinary Tract: Small peripelvic cysts LEFT kidney. Tiny nonobstructing LEFT renal calculus. Adrenal glands, kidneys, ureters, and bladder normal appearance Stomach/Bowel: Appendix not visualized, no pericecal inflammatory process seen. Colon decompressed. Stomach unremarkable.  Dilated proximal and decompressed distal small bowel loops consistent with small bowel obstruction. Obstruction is secondary to a small bowel loop within a LEFT infra umbilical hernia. Mild bowel wall thickening of the associated small bowel loop. Associated diffuse stranding of the small bowel mesentery increased from prior study. Distal small bowel loops unremarkable. Vascular/Lymphatic: Atherosclerotic calcifications aorta, iliac arteries, coronary arteries. Aorta normal caliber. No adenopathy. Reproductive: Foley catheter balloon is inflated within the inferior prostate gland, recommend deflating and advancing. Mild prostatic enlargement gland measuring 5.3 x 5.3 x 6.4 cm (volume = 94 cm^3). Seminal vesicles unremarkable. Other: In addition to LEFT infraumbilical hernia containing obstructed small bowel loop, a small LEFT supraumbilical ventral hernia is seen containing fat. No free air or free fluid. Musculoskeletal: Diffuse osseous demineralization. Mild degenerative changes of the thoracolumbar spine. IMPRESSION: Mid small bowel obstruction secondary to herniation of a small bowel loop into a LEFT infraumbilical ventral hernia, associated with mild bowel wall thickening but no evidence of perforation. Additional small LEFT supraumbilical ventral hernia  containing fat. Foley catheter balloon is inflated within the inferior prostate gland; recommend replacement. Prostatic enlargement. Atherosclerotic calcifications including coronary arteries. Aortic Atherosclerosis (ICD10-I70.0). Findings called to Dr. Roderic Palau On 06/25/2019 at 1723 hrs. Electronically Signed   By: Lavonia Dana M.D.   On: 06/25/2019 17:25   DG Abd 1 View  Result Date: 06/27/2019 CLINICAL DATA:  Small-bowel obstruction, NGT removal yesterday EXAM: ABDOMEN - 1 VIEW COMPARISON:  Radiograph 06/26/2019, CT 06/25/2019 FINDINGS: Limited assessment for free air on supine radiography. There are several air filled loops of small bowel in the mid abdomen  which are upper limits normal caliber though overall degree of distention is decreased from 1 day prior. Few air-filled loops of colon noted as well, nonspecific. Cholecystectomy clips in the right upper quadrant. No suspicious calcifications. Extensive degenerative changes in the spine, hips and pelvis are unchanged from comparison. No acute osseous abnormality. IMPRESSION: 1. Limited assessment for free air on supine radiography. 2. Several air filled loops of small bowel in the mid abdomen which are upper limits normal caliber though overall degree of distention is decreased from 1 day prior. Findings may reflect resolving small bowel obstruction or ileus. Electronically Signed   By: Lovena Le M.D.   On: 06/27/2019 05:16   DG Abd 1 View  Result Date: 06/26/2019 CLINICAL DATA:  Abdominal pain, small-bowel obstruction EXAM: ABDOMEN - 1 VIEW COMPARISON:  CT abdomen pelvis, 06/25/2019 FINDINGS: Single supine abdominal radiograph excludes the upper abdomen and portions of the left and right lateral abdomen. Within this limitation, gas-filled, nondistended loops of small bowel in the included central abdomen, with scattered gas present in the portions of the included colon to the rectum. IMPRESSION: Single supine abdominal radiograph excludes the upper abdomen and portions of the left and right lateral abdomen. Within this limitation, gas-filled, nondistended loops of small bowel in the included central abdomen, with scattered gas present in the portions of the included colon to the rectum. This examination suggests significant resolution of previously noted small bowel obstruction. Electronically Signed   By: Eddie Candle M.D.   On: 06/26/2019 12:41   DG Chest Portable 1 View  Result Date: 06/25/2019 CLINICAL DATA:  Advancement of nasogastric tube. EXAM: PORTABLE CHEST 1 VIEW COMPARISON:  Earlier this day. FINDINGS: Tip of the enteric tube is below the diaphragm in the stomach in the left upper quadrant. The  side-port is not well visualized, but likely below the diaphragm. Low lung volumes. Cardiomegaly is unchanged. No focal airspace disease. IMPRESSION: Tip of the enteric tube below the diaphragm in the stomach in the left upper quadrant. Electronically Signed   By: Keith Rake M.D.   On: 06/25/2019 21:22   DG Chest Portable 1 View  Result Date: 06/25/2019 CLINICAL DATA:  NGT placement EXAM: PORTABLE CHEST 1 VIEW COMPARISON:  04/11/2019 FINDINGS: Cardiomegaly. Esophagogastric tube is positioned with tip over the lower esophagus, although poorly visualized due to underpenetration. Both lungs are clear. The visualized skeletal structures are unremarkable. IMPRESSION: Esophagogastric tube is positioned with tip over the lower esophagus, although poorly visualized due to underpenetration. Recommend advancement to ensure subdiaphragmatic positioning of tip and side port. Electronically Signed   By: Eddie Candle M.D.   On: 06/25/2019 20:15        Scheduled Meds: . amiodarone  200 mg Oral Daily  . Chlorhexidine Gluconate Cloth  6 each Topical Daily  . hydrALAZINE  10 mg Intravenous Once  . methimazole  20 mg Oral BID  . metoprolol tartrate  5 mg Intravenous Q6H  . nystatin   Topical TID  . ofloxacin  1 drop Both Eyes QID  . pantoprazole (PROTONIX) IV  40 mg Intravenous Q12H  . tamsulosin  0.4 mg Oral QPC breakfast   Continuous Infusions: . sodium chloride 100 mL/hr at 06/27/19 0302     LOS: 2 days     Georgette Shell, MD 06/27/2019, 2:02 PM

## 2019-06-27 NOTE — Care Management Important Message (Deleted)
Important Message  Patient Details  Name: Derek Foster MRN: [id number] Date of Birth: 07-Apr-1935   Medicare Important Message Given:  Yes     Tommy Medal 06/27/2019, 3:48 PM

## 2019-06-27 NOTE — Progress Notes (Signed)
[facility] Progress Note     Subjective: Tolerating diet and having BM. No major issues. Cardiology saw and recommended no further workup, moderate risk for surgery.   Objective: Vital signs in last 24 hours: Temp:  [98.5 F (36.9 C)-99 F (37.2 C)] 98.5 F (36.9 C) (06/07 1303) Pulse Rate:  [51-58] 54 (06/07 1303) Resp:  [16-19] 19 (06/07 1303) BP: (149-161)/(45-51) 161/45 (06/07 1303) SpO2:  [96 %-98 %] 98 % (06/07 1303) Last BM Date: 06/26/19  Intake/Output from previous day: 06/06 0701 - 06/07 0700 In: 1650 [I.V.:1650] Out: 550 [Urine:550] Intake/Output this shift: Total I/O In: 1333.3 [P.O.:1320; IV Piggyback:13.3] Out: 1400 [Urine:1400]  General appearance: alert, cooperative and no distress Resp: normal work of breathing GI: soft, nondistended, hernia, reducible, nontender  Lab Results:  Recent Labs    06/25/19 1600 06/26/19 0414  WBC 11.8* 10.0  HGB 11.2* 10.1*  HCT 35.1* 31.9*  PLT 247 221   BMET Recent Labs    06/25/19 1600 06/26/19 0414  NA 140 141  K 3.8 3.4*  CL 99 104  CO2 29 28  GLUCOSE 120* 94  BUN 28* 28*  CREATININE 1.72* 1.63*  CALCIUM 9.0 8.3*    Studies/Results: DG Abd 1 View  Result Date: 06/27/2019 CLINICAL DATA:  Small-bowel obstruction, NGT removal yesterday EXAM: ABDOMEN - 1 VIEW COMPARISON:  Radiograph 06/26/2019, CT 06/25/2019 FINDINGS: Limited assessment for free air on supine radiography. There are several air filled loops of small bowel in the mid abdomen which are upper limits normal caliber though overall degree of distention is decreased from 1 day prior. Few air-filled loops of colon noted as well, nonspecific. Cholecystectomy clips in the right upper quadrant. No suspicious calcifications. Extensive degenerative changes in the spine, hips and pelvis are unchanged from comparison. No acute osseous abnormality. IMPRESSION: 1. Limited assessment for free air on supine radiography. 2. Several air filled  loops of small bowel in the mid abdomen which are upper limits normal caliber though overall degree of distention is decreased from 1 day prior. Findings may reflect resolving small bowel obstruction or ileus. Electronically Signed   By: Lovena Le M.D.   On: 06/27/2019 05:16   DG Abd 1 View  Result Date: 06/26/2019 CLINICAL DATA:  Abdominal pain, small-bowel obstruction EXAM: ABDOMEN - 1 VIEW COMPARISON:  CT abdomen pelvis, 06/25/2019 FINDINGS: Single supine abdominal radiograph excludes the upper abdomen and portions of the left and right lateral abdomen. Within this limitation, gas-filled, nondistended loops of small bowel in the included central abdomen, with scattered gas present in the portions of the included colon to the rectum. IMPRESSION: Single supine abdominal radiograph excludes the upper abdomen and portions of the left and right lateral abdomen. Within this limitation, gas-filled, nondistended loops of small bowel in the included central abdomen, with scattered gas present in the portions of the included colon to the rectum. This examination suggests significant resolution of previously noted small bowel obstruction. Electronically Signed   By: Eddie Candle M.D.   On: 06/26/2019 12:41   DG Chest Portable 1 View  Result Date: 06/25/2019 CLINICAL DATA:  Advancement of nasogastric tube. EXAM: PORTABLE CHEST 1 VIEW COMPARISON:  Earlier this day. FINDINGS: Tip of the enteric tube is below the diaphragm in the stomach in the left upper quadrant. The side-port is not well visualized, but likely below the diaphragm. Low lung volumes. Cardiomegaly is unchanged. No focal airspace disease. IMPRESSION: Tip of the enteric tube below the diaphragm in the stomach in the  left upper quadrant. Electronically Signed   By: Keith Rake M.D.   On: 06/25/2019 21:22   DG Chest Portable 1 View  Result Date: 06/25/2019 CLINICAL DATA:  NGT placement EXAM: PORTABLE CHEST 1 VIEW COMPARISON:  04/11/2019 FINDINGS:  Cardiomegaly. Esophagogastric tube is positioned with tip over the lower esophagus, although poorly visualized due to underpenetration. Both lungs are clear. The visualized skeletal structures are unremarkable. IMPRESSION: Esophagogastric tube is positioned with tip over the lower esophagus, although poorly visualized due to underpenetration. Recommend advancement to ensure subdiaphragmatic positioning of tip and side port. Electronically Signed   By: Eddie Candle M.D.   On: 06/25/2019 20:15    Anti-infectives: Anti-infectives (From admission, onward)   Start     Dose/Rate Route Frequency Ordered Stop   06/27/19 1445  cefTRIAXone (ROCEPHIN) 1 g in sodium chloride 0.9 % 100 mL IVPB     1 g 200 mL/hr over 30 Minutes Intravenous Every 24 hours 06/27/19 1434     06/26/19 1845  fluconazole (DIFLUCAN) IVPB 100 mg  Status:  Discontinued     100 mg 50 mL/hr over 60 Minutes Intravenous Every 24 hours 06/25/19 1832 06/26/19 1356   06/25/19 1845  fluconazole (DIFLUCAN) IVPB 200 mg     200 mg 100 mL/hr over 60 Minutes Intravenous  Once 06/25/19 1832 06/25/19 2030      Assessment/Plan: Mr. Lata is an 84 yo with a recurrent ventral hernia that came in incarcerated with obstruction. The hernia was reduced and the obstruction resolved. This is his second time having this occur in the last few months. He has continued to have physical decline and multiple hospitalizations. Cardiology rates at a moderate risk for post operative complications. I discussed this with the patient, the option to delay surgery again and do outpatient repair, and the option of repairing tomorrow. Discussed risk of bleeding, infection, recurrence, use of mesh, injury to bowel, and cardiac complication. He wants to proceed.   OR tomorrow for ventral hernia repair with mesh    Updated Dr. Zigmund Daniel and RN.    LOS: 2 days    Virl Cagey 06/27/2019

## 2019-06-27 NOTE — Consult Note (Addendum)
Cardiology Consultation:   Patient ID: Derek Foster MRN: [id number]; DOB: 23-Nov-1935  Admit date: 06/25/2019 Date of Consult: 06/27/2019  Primary Care Provider: Celene Squibb, MD [facility] Cardiologist: Skeet Latch, MD  [facility] Electrophysiologist:  None    Patient Profile:   Derek Foster is a 84 y.o. male with a hx of CAD, hx MI PCI to LAD 2011, HTN, HLD, PAF with COVID-19 and Small bowel obstruction 01/2019, hyperthyroid who is being seen today for the evaluation of pre-op for surgery at the request of Dr. Rodena Piety.  History of Present Illness:   Derek Foster with CAD, MI in 2011 and stent to LAD, HTN, HLD and 01/2019 + COVID and small bowel obstruction and atrial fib.  He was found to be hyperthyroid as well.   EF then with EF 60-65% with moderate LVH.  Placed on eliquis, amiodarone, dig, metoprolol 200 BID and dilt.    Also with gross hematuria and saw urology.    In March HR was in the 50s so BB decreased to 100 BID, stopped lanoxin.  Back in ER with back pain, UTI and PNA. But maintaining SR. His dilt was decreased to 180 mg daily in April.  Continued with hematuria.  Also renal insuff.    Now presented to [facility] 06/25/19 with vomiting fecal material, + lower abd pain and CT of abd with small bowel obstruction from ventral hernia with loop of bowel in hernia.  NG placed.   Which has been d/c'd and pt taking sips of liquids.    Surgery has seen and recommends ventral hernia repair.     EKG:  The EKG was personally reviewed and demonstrates:  Today EKG SR at 60 and no ST changes, normal EKG  Telemetry:  Telemetry was personally reviewed and demonstrates:  Sinus brady in upper 40s to 60  Labs today Na 141, k+ 3.4, BUN 28, Cr 1.63 down from 1.72  Lactic acid 1.2 hgb 10.1, WBC 10 down from 11.8 plts 221  COVID neg U/a with large leukocytes but neg nitrites Stool heme +  PCXR Cardiomegaly. Esophagogastric tube is positioned with tip over the lower esophagus, although poorly  visualized due to underpenetration. Both lungs are clear. The visualized skeletal structures are Unremarkable.  abd 1 view  Today  2. Several air filled loops of small bowel in the mid abdomen which are upper limits normal caliber though overall degree of distention is decreased from 1 day prior. Findings may reflect resolving small bowel obstruction or ileus  BP 149/51 P 55 to 66 temp pk of 99 SR No chest pain or SOB but he is not very active, does not climb stairs and only walks to go to MD appointments.  Past Medical History:  Diagnosis Date  . Acute myocardial infarction, unspecified site, episode of care unspecified   . CAD in native artery    a. cath 01/27/2009 : s/p promus DES to LAD and medical managment of 70-80% mRCA  . COVID-19   . Edema   . HTN (hypertension)   . Mixed hyperlipidemia   . Unspecified hypothyroidism     Past Surgical History:  Procedure Laterality Date  . CHOLECYSTECTOMY    . CORONARY STENT PLACEMENT    . CYSTOSCOPY/RETROGRADE/URETEROSCOPY Bilateral 03/09/2019   Procedure: CYSTOSCOPY, Clot Evacuation, Fulguration;  Surgeon: Festus Aloe, MD;  Location: [place];  Service: Urology;  Laterality: Bilateral;  . HERNIA REPAIR       Home Medications:  Prior to Admission medications  Medication Sig Start Date End Date Taking? Authorizing Provider  amiodarone (PACERONE) 200 MG tablet Take 1 tablet (200 mg total) by mouth daily. 05/03/19  Yes Gerlene Fee, NP  apixaban (ELIQUIS) 5 MG TABS tablet Take 1 tablet (5 mg total) by mouth 2 (two) times daily. 05/03/19  Yes Gerlene Fee, NP  bisacodyl (DULCOLAX) 10 MG suppository Place 1 suppository (10 mg total) rectally daily as needed for severe constipation. 03/12/19  Yes Bonnell Public Tublu, MD  Coenzyme Q10 200 MG capsule Take 200 mg by mouth daily.     Yes [provider]  digoxin (LANOXIN) 0.125 MG tablet Take 125 mcg by mouth daily. 06/01/19  Yes [provider]  diltiazem  (CARDIZEM CD) 180 MG 24 hr capsule Take 1 capsule (180 mg total) by mouth daily. 05/03/19  Yes Gerlene Fee, NP  finasteride (PROSCAR) 5 MG tablet Take 1 tablet (5 mg total) by mouth daily. 05/03/19  Yes Gerlene Fee, NP  furosemide (LASIX) 20 MG tablet Take 2 tablets (40 mg total) by mouth daily. 05/03/19  Yes Gerlene Fee, NP  methimazole (TAPAZOLE) 10 MG tablet Take 2 tablets (20 mg total) by mouth 2 (two) times daily. 05/03/19  Yes Gerlene Fee, NP  methocarbamol (ROBAXIN) 750 MG tablet Take 1 tablet (750 mg total) by mouth every 8 (eight) hours as needed for muscle spasms. 05/03/19  Yes Gerlene Fee, NP  metoprolol tartrate (LOPRESSOR) 100 MG tablet Take 0.5 tablets (50 mg total) by mouth 2 (two) times daily. 05/03/19  Yes Gerlene Fee, NP  Multiple Vitamin (MULTIVITAMIN) tablet Take 1 tablet by mouth daily.     Yes [provider]  nitroGLYCERIN (NITROSTAT) 0.4 MG SL tablet Place 1 tablet (0.4 mg total) under the tongue every 5 (five) minutes as needed. 05/03/19  Yes Gerlene Fee, NP  Nutritional Supplements (ENSURE CLEAR) LIQD Take 237 mLs by mouth daily.   Yes [provider]  ofloxacin (OCUFLOX) 0.3 % ophthalmic solution Place 1 drop into both eyes 4 (four) times daily.  06/23/19  Yes [provider]  pantoprazole (PROTONIX) 40 MG tablet Take 40 mg by mouth daily. 06/01/19  Yes [provider]  polyethylene glycol (MIRALAX / GLYCOLAX) 17 g packet Take 17 g by mouth daily as needed for mild constipation. 03/12/19  Yes Bonnell Public Tublu, MD  potassium chloride (KLOR-CON) 10 MEQ tablet Take 1 tablet (10 mEq total) by mouth every evening. 05/03/19  Yes Gerlene Fee, NP  pravastatin (PRAVACHOL) 10 MG tablet Take 1 tablet (10 mg total) by mouth daily at 6 PM. 05/03/19  Yes Green, Phylis Bougie, NP  promethazine (PHENERGAN) 25 MG tablet Take 25 mg by mouth every 6 (six) hours as needed for nausea or vomiting.   Yes [provider]    senna-docusate (SENOKOT-S) 8.6-50 MG tablet Take 1 tablet by mouth 2 (two) times daily. Patient taking differently: Take 1 tablet by mouth daily as needed for mild constipation or moderate constipation.  03/12/19  Yes Vashti Hey, MD  tamsulosin (FLOMAX) 0.4 MG CAPS capsule Take 1 capsule (0.4 mg total) by mouth daily after breakfast. 05/03/19  Yes Gerlene Fee, NP  ondansetron (ZOFRAN) 4 MG tablet Take 1 tablet (4 mg total) by mouth every 6 (six) hours as needed for nausea or vomiting. Patient not taking: Reported on 06/25/2019 05/03/19   Gerlene Fee, NP  simvastatin (ZOCOR) 20 MG tablet Take 1 tablet (20 mg total) by  mouth every evening. 02/25/11 04/02/11  Josue Hector, MD    Inpatient Medications: Scheduled Meds: . Chlorhexidine Gluconate Cloth  6 each Topical Daily  . hydrALAZINE  10 mg Intravenous Once  . methimazole  20 mg Oral BID  . metoprolol tartrate  5 mg Intravenous Q6H  . nystatin   Topical TID  . ofloxacin  1 drop Both Eyes QID  . pantoprazole (PROTONIX) IV  40 mg Intravenous Q12H  . tamsulosin  0.4 mg Oral QPC breakfast   Continuous Infusions: . sodium chloride 100 mL/hr at 06/27/19 0302   PRN Meds: morphine injection, ondansetron **OR** ondansetron (ZOFRAN) IV  Allergies:    Allergies  Allergen Reactions  . Indomethacin Anaphylaxis    But tolerates ibuprofen, aleve  . Iodine-131 Anaphylaxis    Social History:   Social History   Socioeconomic History  . Marital status: Married    Spouse name: Not on file  . Number of children: Not on file  . Years of education: Not on file  . Highest education level: Not on file  Occupational History  . Occupation: Charity fundraiser  Tobacco Use  . Smoking status: Former Smoker    Quit date: 01/20/1957    Years since quitting: 62.4  . Smokeless tobacco: Never Used  Substance and Sexual Activity  . Alcohol use: No  . Drug use: Never  . Sexual activity: Not on file  Other Topics Concern  . Not on file   Social History Narrative   Quit smoking about 62 years ago.   Social Determinants of Health   Financial Resource Strain:   . Difficulty of Paying Living Expenses:   Food Insecurity:   . Worried About Charity fundraiser in the Last Year:   . Arboriculturist in the Last Year:   Transportation Needs:   . Film/video editor (Medical):   Marland Kitchen Lack of Transportation (Non-Medical):   Physical Activity:   . Days of Exercise per Week:   . Minutes of Exercise per Session:   Stress:   . Feeling of Stress :   Social Connections:   . Frequency of Communication with Friends and Family:   . Frequency of Social Gatherings with Friends and Family:   . Attends Religious Services:   . Active Member of Clubs or Organizations:   . Attends Archivist Meetings:   Marland Kitchen Marital Status:   Intimate Partner Violence:   . Fear of Current or Ex-Partner:   . Emotionally Abused:   Marland Kitchen Physically Abused:   . Sexually Abused:     Family History:    Family History  Problem Relation Age of Onset  . Heart attack Father   . Lung cancer Sister      ROS:  Please see the history of present illness.  General:no colds or fevers, no weight changes Skin:no rashes or ulcers HEENT:no blurred vision, no congestion CV:see HPI PUL:see HPI GI:no diarrhea constipation or melena, no indigestion GU:no hematuria, no dysuria MS:no joint pain, no claudication Neuro:no syncope, no lightheadedness Endo:no diabetes, no thyroid disease  All other ROS reviewed and negative.     Physical Exam/Data:   Vitals:   06/26/19 1854 06/26/19 2134 06/27/19 0500 06/27/19 0756  BP: (!) 156/49 (!) 151/49 (!) 149/51   Pulse: (!) 58 (!) 51 (!) 55   Resp: 16 16 16    Temp:  99 F (37.2 C) 98.9 F (37.2 C)   TempSrc:  Oral Oral   SpO2: 97% 98% 96% 97%  Weight:      Height:        Intake/Output Summary (Last 24 hours) at 06/27/2019 0833 Last data filed at 06/27/2019 0600 Gross per 24 hour  Intake 1650 ml  Output 550 ml   Net 1100 ml   Last 3 Weights 06/25/2019 05/20/2019 05/19/2019  Weight (lbs) 262 lb 5.6 oz 267 lb 267 lb  Weight (kg) 119 kg 121.11 kg 121.11 kg     Body mass index is 35.58 kg/m.  General:  Well nourished, well developed, in no acute distress HEENT: normal Lymph: no adenopathy Neck: no JVD Endocrine:  No thryomegaly Vascular: No carotid bruits; FA pulses 2+ bilaterally without bruits  Cardiac:  normal S1, S2; RRR; no murmur gallup rub or click Lungs:  clear to auscultation bilaterally, no wheezing, + rhonchi no rales  Abd: soft, nontender, no hepatomegaly  Ext: no edema Musculoskeletal:  No deformities, BUE and BLE strength normal and equal Skin: warm and dry  Neuro:  Alert and oriented X 3 MAE follows commands, no focal abnormalities noted Psych:  Normal affect   Relevant CV Studies: 02/13/19 ECHO IMPRESSIONS    1. Left ventricular ejection fraction, by visual estimation, is 60 to  65%. The left ventricle has normal function. There is moderately increased  left ventricular hypertrophy.  2. Definity contrast agent was given IV to delineate the left ventricular  endocardial borders.  3. Left ventricular diastolic parameters are indeterminate.  4. The left ventricle has no regional wall motion abnormalities.  5. Global right ventricle has normal systolic function.The right  ventricular size is normal. Right vetricular wall thickness was not  assessed.  6. Left atrial size was severely dilated.  7. Right atrial size was normal.  8. The mitral valve is grossly normal. No evidence of mitral valve  regurgitation.  9. The tricuspid valve is grossly normal.  10. The tricuspid valve is grossly normal. Tricuspid valve regurgitation  is mild.  11. The aortic valve is tricuspid. Aortic valve regurgitation is not  visualized. No evidence of aortic valve sclerosis or stenosis.  12. The pulmonic valve was grossly normal. Pulmonic valve regurgitation is  not visualized.  13.  Mildly elevated pulmonary artery systolic pressure.  14. The interatrial septum was not well visualized.   FINDINGS  Left Ventricle: Left ventricular ejection fraction, by visual estimation,  is 60 to 65%. The left ventricle has normal function. Definity contrast  agent was given IV to delineate the left ventricular endocardial borders.  The left ventricle has no  regional wall motion abnormalities. The left ventricular internal cavity  size was the left ventricle is normal in size. There is moderately  increased left ventricular hypertrophy. Concentric left ventricular  hypertrophy. Left ventricular diastolic  parameters are indeterminate.   Right Ventricle: The right ventricular size is normal. Right vetricular  wall thickness was not assessed. Global RV systolic function is has normal  systolic function. The tricuspid regurgitant velocity is 2.65 m/s, and  with an assumed right atrial  pressure of 3 mmHg, the estimated right ventricular systolic pressure is  mildly elevated at 31.1 mmHg.   Left Atrium: Left atrial size was severely dilated.   Right Atrium: Right atrial size was normal in size   Pericardium: There is no evidence of pericardial effusion.   Mitral Valve: The mitral valve is grossly normal. No evidence of mitral  valve regurgitation.   Tricuspid Valve: The tricuspid valve is grossly normal. Tricuspid valve  regurgitation is mild.   Aortic Valve:  The aortic valve is tricuspid. Aortic valve regurgitation is  not visualized. The aortic valve is structurally normal, with no evidence  of sclerosis or stenosis. Mild aortic valve annular calcification.   Pulmonic Valve: The pulmonic valve was grossly normal. Pulmonic valve  regurgitation is not visualized. Pulmonic regurgitation is not visualized.   Aorta: The aortic root is normal in size and structure.   Venous: The inferior vena cava was not well visualized.   IAS/Shunts: The interatrial septum was not well  visualized.     LEFT VENTRICLE  PLAX 2D  LVIDd:     4.80 cm    Diastology  LVIDs:     3.30 cm    LV e' lateral: 14.40 cm/s  LV PW:     1.30 cm  LV IVS:    1.40 cm  LVOT diam:   2.00 cm  LV SV:     63 ml  LV SV Index:  24.21  LVOT Area:   3.14 cm    LV Volumes (MOD)  LV area d, A2C:  25.20 cm  LV area d, A4C:  28.70 cm  LV area s, A2C:  11.10 cm  LV area s, A4C:  17.40 cm  LV major d, A2C:  7.62 cm  LV major d, A4C:  7.89 cm  LV major s, A2C:  5.17 cm  LV major s, A4C:  6.14 cm  LV vol d, MOD A2C: 66.5 ml  LV vol d, MOD A4C: 88.5 ml  LV vol s, MOD A2C: 19.0 ml  LV vol s, MOD A4C: 40.1 ml  LV SV MOD A2C:   47.5 ml  LV SV MOD A4C:   88.5 ml  LV SV MOD BP:   47.2 ml    nuc study 02/2009   Overall Impression  Exercise Capacity: Poor exercise capacity. BP Response: Hypertensive blood pressure response. Clinical Symptoms: No chest pain ECG Impression: No diagnostic ST segment change suggestive of ischemia; occasional PVCs and nonsustained VT (longest 5 beats). Overall Impression: There is no sign of scar or ischemia.  Appended Document: Cardiology Nuclear Testing Normal nuclear  Laboratory Data:  High Sensitivity Troponin:  No results for input(s): TROPONINIHS in the last 720 hours.   Chemistry Recent Labs  Lab 06/25/19 1600 06/26/19 0414  NA 140 141  K 3.8 3.4*  CL 99 104  CO2 29 28  GLUCOSE 120* 94  BUN 28* 28*  CREATININE 1.72* 1.63*  CALCIUM 9.0 8.3*  GFRNONAA 36* 39*  GFRAA 42* 45*  ANIONGAP 12 9    Recent Labs  Lab 06/25/19 1600  PROT 7.0  ALBUMIN 4.1  AST 15  ALT 13  ALKPHOS 89  BILITOT 0.9   Hematology Recent Labs  Lab 06/25/19 1600 06/26/19 0414  WBC 11.8* 10.0  RBC 4.01* 3.64*  HGB 11.2* 10.1*  HCT 35.1* 31.9*  MCV 87.5 87.6  MCH 27.9 27.7  MCHC 31.9 31.7  RDW 14.9 14.9  PLT 247 221   BNPNo results for input(s): BNP, PROBNP in the last 168 hours.  DDimer No results  for input(s): DDIMER in the last 168 hours.   Radiology/Studies:  CT ABDOMEN PELVIS WO CONTRAST  Result Date: 06/25/2019 CLINICAL DATA:  Feculent vomiting for 2 days, low-grade fever, cramping, lower abdominal pain, history hypertension, coronary artery disease post MI EXAM: CT ABDOMEN AND PELVIS WITHOUT CONTRAST TECHNIQUE: Multidetector CT imaging of the abdomen and pelvis was performed following the standard protocol without IV contrast. Sagittal and coronal MPR images reconstructed  from axial data set. No oral contrast was administered. COMPARISON:  02/12/2019 FINDINGS: Lower chest: Mild bibasilar atelectasis Hepatobiliary: Gallbladder surgically absent.  Liver unremarkable. Pancreas: Normal appearance Spleen: Normal appearance Adrenals/Urinary Tract: Small peripelvic cysts LEFT kidney. Tiny nonobstructing LEFT renal calculus. Adrenal glands, kidneys, ureters, and bladder normal appearance Stomach/Bowel: Appendix not visualized, no pericecal inflammatory process seen. Colon decompressed. Stomach unremarkable. Dilated proximal and decompressed distal small bowel loops consistent with small bowel obstruction. Obstruction is secondary to a small bowel loop within a LEFT infra umbilical hernia. Mild bowel wall thickening of the associated small bowel loop. Associated diffuse stranding of the small bowel mesentery increased from prior study. Distal small bowel loops unremarkable. Vascular/Lymphatic: Atherosclerotic calcifications aorta, iliac arteries, coronary arteries. Aorta normal caliber. No adenopathy. Reproductive: Foley catheter balloon is inflated within the inferior prostate gland, recommend deflating and advancing. Mild prostatic enlargement gland measuring 5.3 x 5.3 x 6.4 cm (volume = 94 cm^3). Seminal vesicles unremarkable. Other: In addition to LEFT infraumbilical hernia containing obstructed small bowel loop, a small LEFT supraumbilical ventral hernia is seen containing fat. No free air or free  fluid. Musculoskeletal: Diffuse osseous demineralization. Mild degenerative changes of the thoracolumbar spine. IMPRESSION: Mid small bowel obstruction secondary to herniation of a small bowel loop into a LEFT infraumbilical ventral hernia, associated with mild bowel wall thickening but no evidence of perforation. Additional small LEFT supraumbilical ventral hernia containing fat. Foley catheter balloon is inflated within the inferior prostate gland; recommend replacement. Prostatic enlargement. Atherosclerotic calcifications including coronary arteries. Aortic Atherosclerosis (ICD10-I70.0). Findings called to Dr. Roderic Palau On 06/25/2019 at 1723 hrs. Electronically Signed   By: Lavonia Dana M.D.   On: 06/25/2019 17:25   DG Abd 1 View  Result Date: 06/27/2019 CLINICAL DATA:  Small-bowel obstruction, NGT removal yesterday EXAM: ABDOMEN - 1 VIEW COMPARISON:  Radiograph 06/26/2019, CT 06/25/2019 FINDINGS: Limited assessment for free air on supine radiography. There are several air filled loops of small bowel in the mid abdomen which are upper limits normal caliber though overall degree of distention is decreased from 1 day prior. Few air-filled loops of colon noted as well, nonspecific. Cholecystectomy clips in the right upper quadrant. No suspicious calcifications. Extensive degenerative changes in the spine, hips and pelvis are unchanged from comparison. No acute osseous abnormality. IMPRESSION: 1. Limited assessment for free air on supine radiography. 2. Several air filled loops of small bowel in the mid abdomen which are upper limits normal caliber though overall degree of distention is decreased from 1 day prior. Findings may reflect resolving small bowel obstruction or ileus. Electronically Signed   By: Lovena Le M.D.   On: 06/27/2019 05:16   DG Abd 1 View  Result Date: 06/26/2019 CLINICAL DATA:  Abdominal pain, small-bowel obstruction EXAM: ABDOMEN - 1 VIEW COMPARISON:  CT abdomen pelvis, 06/25/2019  FINDINGS: Single supine abdominal radiograph excludes the upper abdomen and portions of the left and right lateral abdomen. Within this limitation, gas-filled, nondistended loops of small bowel in the included central abdomen, with scattered gas present in the portions of the included colon to the rectum. IMPRESSION: Single supine abdominal radiograph excludes the upper abdomen and portions of the left and right lateral abdomen. Within this limitation, gas-filled, nondistended loops of small bowel in the included central abdomen, with scattered gas present in the portions of the included colon to the rectum. This examination suggests significant resolution of previously noted small bowel obstruction. Electronically Signed   By: Eddie Candle M.D.   On: 06/26/2019 12:41  DG Chest Portable 1 View  Result Date: 06/25/2019 CLINICAL DATA:  Advancement of nasogastric tube. EXAM: PORTABLE CHEST 1 VIEW COMPARISON:  Earlier this day. FINDINGS: Tip of the enteric tube is below the diaphragm in the stomach in the left upper quadrant. The side-port is not well visualized, but likely below the diaphragm. Low lung volumes. Cardiomegaly is unchanged. No focal airspace disease. IMPRESSION: Tip of the enteric tube below the diaphragm in the stomach in the left upper quadrant. Electronically Signed   By: Keith Rake M.D.   On: 06/25/2019 21:22   DG Chest Portable 1 View  Result Date: 06/25/2019 CLINICAL DATA:  NGT placement EXAM: PORTABLE CHEST 1 VIEW COMPARISON:  04/11/2019 FINDINGS: Cardiomegaly. Esophagogastric tube is positioned with tip over the lower esophagus, although poorly visualized due to underpenetration. Both lungs are clear. The visualized skeletal structures are unremarkable. IMPRESSION: Esophagogastric tube is positioned with tip over the lower esophagus, although poorly visualized due to underpenetration. Recommend advancement to ensure subdiaphragmatic positioning of tip and side port. Electronically  Signed   By: Eddie Candle M.D.   On: 06/25/2019 20:15          Assessment and Plan:   1. Pre- op eval in pt now  With small bowel obstruction now resolving and plan for Ventral hernia repair and hx of CAD and MI 2011 with stent to LAD.  Neg nuc later that year.  Pt without any angina symptoms of chest pain or SOB but pt is not active.  Dr. Domenic Polite has seen.  Ok to proceed with surgery. Moderate risk with 6.6% risk of moderate cardiac event.  2. PAF post surgery 01/2019 and has been maintaining SR since and decreasing doses of BB and Dilt due to bradycardia, remains on amiodarone and eliquis. His dig had been stopped as outpt but pt continued so will stop now.  Amiodarone on hold - for possible surgery   Would benefit from IV amio or PO to prevent post op a fib.  Now on IV lopressor 5 mg every 6 hours due to NPO status.. Pt in SR  Resume eliquis back post op  3. Anticoagulation on eliquis with CHA2DS2VASc of 4 4. Hyperthyroid followed by PCP 5. Small bowel obstruction this admit. Per primary team.  6. Anemia with heme + stools , no hematuria on U/A 7. AKI with Cr higher than recent outpt of 1.47 and 1.53.       For questions or updates, please contact [place] Please consult [email or web address] for contact info under    Signed, Cecilie Kicks, NP  06/27/2019 8:33 AM    Attending note:  Patient seen and examined.  I reviewed his records, updated the chart, and discussed the case with Ms. Ingold NP.  Derek Foster Foster now with recent small bowel obstruction and ventral hernia, anticipates ventral hernia repair under general anesthesia in the next few days per Dr. Constance Haw.  From a cardiac perspective he does not report any recent angina symptoms with basic ADLs and low-level activity, no recently documented atrial fibrillation.  On examination he appears comfortable, no abdominal pain.  Is in sinus rhythm by telemetry and ECG, systolic blood pressure in the 140s.  Lungs are clear without  labored breathing.  Cardiac exam reveals RRR without gallop.  Pertinent lab work includes potassium 3.4, creatinine 1.63, hemoglobin 10.1, platelets 221, fecal occult positive, SARS coronavirus 2 test negative.  Echocardiogram from January of this year reported LVEF 60 to 65% with moderate LVH, severely dilated left  atrium.  Preoperative cardiac evaluation prior to ventral hernia repair under general anesthesia.  RCRI cardiac risk calculation indicates class III, moderate risk overall, 6.6% chance of major adverse cardiac event.  He has not reported any recent worsening angina or CHF symptoms and is in sinus rhythm at this time.  Patient should be able to proceed with surgery without further cardiac testing, ECG obtained today.  Given recent n.p.o. status he is receiving beta-blocker IV, was able to tolerate clears today and would go ahead and resume amiodarone to reduce risk of perioperative atrial fibrillation.  Eliquis remains on hold for now.  Satira Sark, M.D., F.A.C.C.

## 2019-06-27 NOTE — Care Management Important Message (Signed)
Important Message  Patient Details  Name: Derek Foster MRN: [id number] Date of Birth: 01-Nov-1935   Medicare Important Message Given:  Yes     Tommy Medal 06/27/2019, 2:49 PM

## 2019-06-27 NOTE — Consult Note (Signed)
Dr. Zigmund Daniel called about this stable pt with coffee ground emesis, hemocult positive stool for surgery for SBO;   requesting we see pt post-operatively.  Will arrange.  PPI empirically recommended.

## 2019-06-27 NOTE — Progress Notes (Signed)
**Note De-identified Derek Foster Obfuscation** EKG complete and placed in patient chart 

## 2019-06-28 ENCOUNTER — Inpatient Hospital Stay (HOSPITAL_COMMUNITY): Payer: PPO | Admitting: Anesthesiology

## 2019-06-28 ENCOUNTER — Encounter (HOSPITAL_COMMUNITY)
Admission: EM | Disposition: A | Discharge status: Skilled Nursing Facility | Payer: Self-pay | Source: Home / Self Care | Attending: Internal Medicine

## 2019-06-28 HISTORY — PX: BOWEL RESECTION: SHX1257

## 2019-06-28 HISTORY — PX: VENTRAL HERNIA REPAIR: SHX424

## 2019-06-28 HISTORY — PX: LYSIS OF ADHESION: SHX5961

## 2019-06-28 LAB — COMPREHENSIVE METABOLIC PANEL
ALT: 15 U/L (ref 0–44)
AST: 12 U/L — ABNORMAL LOW (ref 15–41)
Albumin: 2.8 g/dL — ABNORMAL LOW (ref 3.5–5.0)
Alkaline Phosphatase: 76 U/L (ref 38–126)
Anion gap: 6 (ref 5–15)
BUN: 16 mg/dL (ref 8–23)
CO2: 24 mmol/L (ref 22–32)
Calcium: 7.9 mg/dL — ABNORMAL LOW (ref 8.9–10.3)
Chloride: 109 mmol/L (ref 98–111)
Creatinine, Ser: 1.25 mg/dL — ABNORMAL HIGH (ref 0.61–1.24)
GFR calc Af Amer: >60 mL/min (ref >60)
GFR calc non Af Amer: 53 mL/min — ABNORMAL LOW (ref >60)
Glucose, Bld: 112 mg/dL — ABNORMAL HIGH (ref 70–99)
Potassium: 3 mmol/L — ABNORMAL LOW (ref 3.5–5.1)
Sodium: 139 mmol/L (ref 135–145)
Total Bilirubin: 0.4 mg/dL (ref 0.3–1.2)
Total Protein: 5.2 g/dL — ABNORMAL LOW (ref 6.5–8.1)

## 2019-06-28 LAB — URINE CULTURE: Culture: >=100000 — AB

## 2019-06-28 LAB — BASIC METABOLIC PANEL
Anion gap: 10 (ref 5–15)
BUN: 13 mg/dL (ref 8–23)
CO2: 23 mmol/L (ref 22–32)
Calcium: 8 mg/dL — ABNORMAL LOW (ref 8.9–10.3)
Chloride: 105 mmol/L (ref 98–111)
Creatinine, Ser: 1.27 mg/dL — ABNORMAL HIGH (ref 0.61–1.24)
GFR calc Af Amer: >60 mL/min (ref >60)
GFR calc non Af Amer: 52 mL/min — ABNORMAL LOW (ref >60)
Glucose, Bld: 157 mg/dL — ABNORMAL HIGH (ref 70–99)
Potassium: 3.4 mmol/L — ABNORMAL LOW (ref 3.5–5.1)
Sodium: 138 mmol/L (ref 135–145)

## 2019-06-28 LAB — MAGNESIUM: Magnesium: 1.9 mg/dL (ref 1.7–2.4)

## 2019-06-28 LAB — CBC
HCT: 30.5 % — ABNORMAL LOW (ref 39.0–52.0)
Hemoglobin: 9.8 g/dL — ABNORMAL LOW (ref 13.0–17.0)
MCH: 28.6 pg (ref 26.0–34.0)
MCHC: 32.1 g/dL (ref 30.0–36.0)
MCV: 88.9 fL (ref 80.0–100.0)
Platelets: 162 10*3/uL (ref 150–400)
RBC: 3.43 MIL/uL — ABNORMAL LOW (ref 4.22–5.81)
RDW: 14.9 % (ref 11.5–15.5)
WBC: 7.2 10*3/uL (ref 4.0–10.5)
nRBC: 0 % (ref 0.0–0.2)

## 2019-06-28 SURGERY — REPAIR, HERNIA, VENTRAL
Anesthesia: General | Site: Abdomen

## 2019-06-28 MED ORDER — FENTANYL CITRATE (PF) 100 MCG/2ML IJ SOLN
INTRAMUSCULAR | Status: AC
  Filled 2019-06-28: qty 2

## 2019-06-28 MED ORDER — PROPOFOL 10 MG/ML IV BOLUS
INTRAVENOUS | Status: DC | PRN
  Administered 2019-06-28: 120 mg via INTRAVENOUS

## 2019-06-28 MED ORDER — LIDOCAINE 2% (20 MG/ML) 5 ML SYRINGE
INTRAMUSCULAR | Status: AC
  Filled 2019-06-28: qty 5

## 2019-06-28 MED ORDER — BUPIVACAINE LIPOSOME 1.3 % IJ SUSP
INTRAMUSCULAR | Status: DC | PRN
  Administered 2019-06-28: 20 mL

## 2019-06-28 MED ORDER — DEXAMETHASONE SODIUM PHOSPHATE 10 MG/ML IJ SOLN
INTRAMUSCULAR | Status: AC
  Filled 2019-06-28: qty 1

## 2019-06-28 MED ORDER — ONDANSETRON HCL 4 MG/2ML IJ SOLN
INTRAMUSCULAR | Status: DC | PRN
  Administered 2019-06-28: 4 mg via INTRAVENOUS

## 2019-06-28 MED ORDER — BUPIVACAINE LIPOSOME 1.3 % IJ SUSP
INTRAMUSCULAR | Status: AC
  Filled 2019-06-28: qty 20

## 2019-06-28 MED ORDER — SUGAMMADEX SODIUM 500 MG/5ML IV SOLN
INTRAVENOUS | Status: DC | PRN
  Administered 2019-06-28: 200 mg via INTRAVENOUS

## 2019-06-28 MED ORDER — LACTATED RINGERS IV SOLN
INTRAVENOUS | Status: DC | PRN
Start: 2019-06-28 — End: 2019-06-28

## 2019-06-28 MED ORDER — POTASSIUM CHLORIDE 10 MEQ/100ML IV SOLN
10.0000 meq | INTRAVENOUS | Status: AC
  Administered 2019-06-28 (×3): 10 meq via INTRAVENOUS
  Filled 2019-06-28 (×6): qty 100

## 2019-06-28 MED ORDER — SUCCINYLCHOLINE CHLORIDE 20 MG/ML IJ SOLN
INTRAMUSCULAR | Status: DC | PRN
  Administered 2019-06-28: 160 mg via INTRAVENOUS

## 2019-06-28 MED ORDER — ORAL CARE MOUTH RINSE: 15.0000 mL | Freq: Once | OROMUCOSAL | Status: AC

## 2019-06-28 MED ORDER — CHLORHEXIDINE GLUCONATE 0.12 % MT SOLN
15.0000 mL | Freq: Once | OROMUCOSAL | Status: AC
  Administered 2019-06-28: 15 mL via OROMUCOSAL
  Filled 2019-06-28: qty 15

## 2019-06-28 MED ORDER — HYDROMORPHONE HCL 1 MG/ML IJ SOLN
0.2500 mg | INTRAMUSCULAR | Status: DC | PRN
  Administered 2019-06-28 (×3): 0.5 mg via INTRAVENOUS
  Filled 2019-06-28 (×3): qty 0.5

## 2019-06-28 MED ORDER — CEFAZOLIN SODIUM-DEXTROSE 1-4 GM/50ML-% IV SOLN
1.0000 g | Freq: Two times a day (BID) | INTRAVENOUS | Status: DC
  Administered 2019-06-28: 1 g via INTRAVENOUS
  Filled 2019-06-28 (×5): qty 50

## 2019-06-28 MED ORDER — GLYCOPYRROLATE PF 0.2 MG/ML IJ SOSY
PREFILLED_SYRINGE | INTRAMUSCULAR | Status: AC
  Filled 2019-06-28: qty 1

## 2019-06-28 MED ORDER — ROCURONIUM BROMIDE 10 MG/ML (PF) SYRINGE
PREFILLED_SYRINGE | INTRAVENOUS | Status: AC
  Filled 2019-06-28: qty 10

## 2019-06-28 MED ORDER — LIDOCAINE HCL (CARDIAC) PF 50 MG/5ML IV SOSY
PREFILLED_SYRINGE | INTRAVENOUS | Status: DC | PRN
  Administered 2019-06-28 (×2): 100 mg via INTRAVENOUS

## 2019-06-28 MED ORDER — DEXAMETHASONE SODIUM PHOSPHATE 4 MG/ML IJ SOLN
INTRAMUSCULAR | Status: DC | PRN
  Administered 2019-06-28: 4 mg via INTRAVENOUS

## 2019-06-28 MED ORDER — PROPOFOL 10 MG/ML IV BOLUS
INTRAVENOUS | Status: AC
  Filled 2019-06-28: qty 20

## 2019-06-28 MED ORDER — ONDANSETRON HCL 4 MG/2ML IJ SOLN
INTRAMUSCULAR | Status: AC
  Filled 2019-06-28: qty 2

## 2019-06-28 MED ORDER — GLYCOPYRROLATE 0.2 MG/ML IJ SOLN
INTRAMUSCULAR | Status: DC | PRN
  Administered 2019-06-28 (×2): .2 mg via INTRAVENOUS

## 2019-06-28 MED ORDER — LACTATED RINGERS IV SOLN: Freq: Once | INTRAVENOUS | Status: AC

## 2019-06-28 MED ORDER — FENTANYL CITRATE (PF) 100 MCG/2ML IJ SOLN
INTRAMUSCULAR | Status: DC | PRN
  Administered 2019-06-28 (×4): 50 ug via INTRAVENOUS

## 2019-06-28 MED ORDER — HYDRALAZINE HCL 10 MG PO TABS
10.0000 mg | ORAL_TABLET | Freq: Three times a day (TID) | ORAL | Status: DC | PRN
  Administered 2019-06-28 – 2019-06-29 (×3): 10 mg via ORAL
  Filled 2019-06-28 (×3): qty 1

## 2019-06-28 MED ORDER — 0.9 % SODIUM CHLORIDE (POUR BTL) OPTIME
TOPICAL | Status: DC | PRN
  Administered 2019-06-28 (×3): 1000 mL

## 2019-06-28 MED ORDER — ROCURONIUM BROMIDE 100 MG/10ML IV SOLN
INTRAVENOUS | Status: DC | PRN
  Administered 2019-06-28: 20 mg via INTRAVENOUS
  Administered 2019-06-28 (×2): 10 mg via INTRAVENOUS
  Administered 2019-06-28: 30 mg via INTRAVENOUS
  Administered 2019-06-28 (×2): 10 mg via INTRAVENOUS

## 2019-06-28 MED ORDER — SUCCINYLCHOLINE CHLORIDE 200 MG/10ML IV SOSY
PREFILLED_SYRINGE | INTRAVENOUS | Status: AC
  Filled 2019-06-28: qty 10

## 2019-06-28 SURGICAL SUPPLY — 50 items
CHLORAPREP W/TINT 26 (MISCELLANEOUS) ×2 IMPLANT
CLOTH BEACON ORANGE TIMEOUT ST (SAFETY) ×2 IMPLANT
COVER LIGHT HANDLE STERIS (MISCELLANEOUS) ×4 IMPLANT
COVER WAND RF STERILE (DRAPES) ×2 IMPLANT
DRSG OPSITE POSTOP 4X10 (GAUZE/BANDAGES/DRESSINGS) ×1 IMPLANT
ELECT REM PT RETURN 9FT ADLT (ELECTROSURGICAL) ×2
ELECTRODE REM PT RTRN 9FT ADLT (ELECTROSURGICAL) ×1 IMPLANT
GAUZE SPONGE 4X4 12PLY STRL (GAUZE/BANDAGES/DRESSINGS) ×2 IMPLANT
GLOVE BIO SURGEON STRL SZ 6.5 (GLOVE) ×3 IMPLANT
GLOVE BIO SURGEON STRL SZ7 (GLOVE) ×4 IMPLANT
GLOVE BIOGEL PI IND STRL 6.5 (GLOVE) ×1 IMPLANT
GLOVE BIOGEL PI IND STRL 7.0 (GLOVE) ×2 IMPLANT
GLOVE BIOGEL PI INDICATOR 6.5 (GLOVE) ×1
GLOVE BIOGEL PI INDICATOR 7.0 (GLOVE) ×2
GLOVE SURG SS PI 7.5 STRL IVOR (GLOVE) IMPLANT
GOWN STRL REUS W/TWL LRG LVL3 (GOWN DISPOSABLE) ×4 IMPLANT
HANDLE SUCTION POOLE (INSTRUMENTS) IMPLANT
INST SET MAJOR GENERAL (KITS) ×2 IMPLANT
KIT TURNOVER KIT A (KITS) ×2 IMPLANT
LIGASURE IMPACT 36 18CM CVD LR (INSTRUMENTS) ×1 IMPLANT
MANIFOLD NEPTUNE II (INSTRUMENTS) ×2 IMPLANT
MESH VICRYL KNITTED 12X12 (Mesh General) ×1 IMPLANT
NDL HYPO 25X1 1.5 SAFETY (NEEDLE) ×1 IMPLANT
NEEDLE HYPO 25X1 1.5 SAFETY (NEEDLE) ×2 IMPLANT
NS IRRIG 1000ML POUR BTL (IV SOLUTION) ×4 IMPLANT
PACK MAJOR ABDOMINAL (CUSTOM PROCEDURE TRAY) ×2 IMPLANT
PAD ARMBOARD 7.5X6 YLW CONV (MISCELLANEOUS) ×2 IMPLANT
PENCIL SMOKE EVACUATOR (MISCELLANEOUS) ×1 IMPLANT
RELOAD PROXIMATE 75MM BLUE (ENDOMECHANICALS) ×10 IMPLANT
RELOAD STAPLE 75 3.8 BLU REG (ENDOMECHANICALS) IMPLANT
SET BASIN LINEN APH (SET/KITS/TRAYS/PACK) ×2 IMPLANT
SPONGE LAP 18X18 X RAY DECT (DISPOSABLE) ×2 IMPLANT
STAPLER GUN LINEAR PROX 60 (STAPLE) ×2 IMPLANT
STAPLER PROXIMATE 75MM BLUE (STAPLE) ×2 IMPLANT
STAPLER VISISTAT (STAPLE) ×2 IMPLANT
SUCTION POOLE HANDLE (INSTRUMENTS) ×2
SUT ETHIBOND NAB MO 7 #0 18IN (SUTURE) IMPLANT
SUT MNCRL AB 4-0 PS2 18 (SUTURE) ×1 IMPLANT
SUT NOVA NAB GS-21 1 T12 (SUTURE) IMPLANT
SUT NOVA NAB GS-22 2 2-0 T-19 (SUTURE) ×2 IMPLANT
SUT NOVA NAB GS-26 0 60 (SUTURE) IMPLANT
SUT PDS AB CT VIOLET #0 27IN (SUTURE) ×6 IMPLANT
SUT SILK 2 0 (SUTURE)
SUT SILK 2-0 18XBRD TIE 12 (SUTURE) IMPLANT
SUT SILK 3 0 SH CR/8 (SUTURE) ×6 IMPLANT
SUT VIC AB 2-0 CT2 27 (SUTURE) ×1 IMPLANT
SUT VIC AB 3-0 SH 27 (SUTURE) ×8
SUT VIC AB 3-0 SH 27X BRD (SUTURE) IMPLANT
SUT VICRYL AB 2 0 TIES (SUTURE) ×2 IMPLANT
SYR 20ML LL LF (SYRINGE) ×2 IMPLANT

## 2019-06-28 NOTE — Progress Notes (Signed)
[facility]  Checked on patient. Hypertensive and hospitalist getting him some hydralazine. Likely missed some doses of stuff.  Feeling ok. Says he feels sore. HR 60s.  NG and NPO for now given bowel resection. Can hold NG for meds.   Curlene Labrum, MD [facility] [address] [place], [place] [address] [phone] (office)

## 2019-06-28 NOTE — Anesthesia Procedure Notes (Signed)
Procedure Name: Intubation Performed by: Tacy Learn, CRNA Pre-anesthesia Checklist: Patient identified, Emergency Drugs available, Suction available, Patient being monitored and Timeout performed Patient Re-evaluated:Patient Re-evaluated prior to induction Oxygen Delivery Method: Circle system utilized Preoxygenation: Pre-oxygenation with 100% oxygen Induction Type: IV induction Grade View: Grade II Tube type: Oral Tube size: 8.0 mm Number of attempts: 1 Airway Equipment and Method: Stylet Placement Confirmation: ETT inserted through vocal cords under direct vision,  positive ETCO2,  CO2 detector and breath sounds checked- equal and bilateral Secured at: 23 cm Tube secured with: Tape Dental Injury: Teeth and Oropharynx as per pre-operative assessment

## 2019-06-28 NOTE — Anesthesia Preprocedure Evaluation (Addendum)
Anesthesia Evaluation  Patient identified by MRN, date of birth, ID band Patient awake    Reviewed: Allergy & Precautions, NPO status , Patient's Chart, lab work & pertinent test results  Airway Mallampati: III  TM Distance: >3 FB Neck ROM: Full    Dental  (+) Dental Advisory Given   Pulmonary former smoker,    Pulmonary exam normal breath sounds clear to auscultation       Cardiovascular Exercise Tolerance: Good hypertension, + CAD, + Past MI and + Cardiac Stents  Normal cardiovascular exam+ dysrhythmias Atrial Fibrillation  Rhythm:Regular Rate:Normal  . Left ventricular ejection fraction, by visual estimation, is 60 to  65%. The left ventricle has normal function. There is moderately increased  left ventricular hypertrophy.  2. Definity contrast agent was given IV to delineate the left ventricular  endocardial borders.  3. Left ventricular diastolic parameters are indeterminate.  4. The left ventricle has no regional wall motion abnormalities.  5. Global right ventricle has normal systolic function.The right  ventricular size is normal. Right vetricular wall thickness was not  assessed.  6. Left atrial size was severely dilated.  7. Right atrial size was normal.  8. The mitral valve is grossly normal. No evidence of mitral valve  regurgitation.  9. The tricuspid valve is grossly normal.  10. The tricuspid valve is grossly normal. Tricuspid valve regurgitation  is mild.  11. The aortic valve is tricuspid. Aortic valve regurgitation is not  visualized. No evidence of aortic valve sclerosis or stenosis.  12. The pulmonic valve was grossly normal. Pulmonic valve regurgitation is  not visualized.  13. Mildly elevated pulmonary artery systolic pressure.  14. The interatrial septum was not well visualized.    Neuro/Psych  Neuromuscular disease negative psych ROS   GI/Hepatic GERD  Medicated and Controlled,   Endo/Other  Hypothyroidism Hyperthyroidism   Renal/GU ARFRenal disease  negative genitourinary   Musculoskeletal negative musculoskeletal ROS (+)   Abdominal   Peds  Hematology   Anesthesia Other Findings 27-Jun-2019 09:30:07 [facility] ROUTINE RECORD Normal sinus rhythm Normal ECG  Reproductive/Obstetrics negative OB ROS                            Anesthesia Physical Anesthesia Plan  ASA: III  Anesthesia Plan: General   Post-op Pain Management:    Induction: Intravenous and Rapid sequence  PONV Risk Score and Plan: 4 or greater and Ondansetron and Dexamethasone  Airway Management Planned: Oral ETT  Additional Equipment:   Intra-op Plan:   Post-operative Plan: Extubation in OR  Informed Consent: I have reviewed the patients History and Physical, chart, labs and discussed the procedure including the risks, benefits and alternatives for the proposed anesthesia with the patient or authorized representative who has indicated his/her understanding and acceptance.    Suspend DNR.   Dental advisory given  Plan Discussed with: CRNA and Surgeon  Anesthesia Plan Comments:         Anesthesia Quick Evaluation

## 2019-06-28 NOTE — Transfer of Care (Signed)
Immediate Anesthesia Transfer of Care Note  Patient: Derek Foster  Procedure(s) Performed: HERNIA REPAIR VENTRAL ADULT WITH VICRYL MESH OVERLAY (N/A Abdomen) LYSIS OF ADHESION (N/A Abdomen) SMALL BOWEL RESECTION (N/A Abdomen)  Patient Location: PACU  Anesthesia Type:General  Level of Consciousness: awake, alert , oriented and patient cooperative  Airway & Oxygen Therapy: Patient Spontanous Breathing and Patient connected to face mask oxygen  Post-op Assessment: Report given to RN, Post -op Vital signs reviewed and stable and Patient moving all extremities  Post vital signs: Reviewed and stable  Last Vitals:  Vitals Value Taken Time  BP 160/57 06/28/19 1534  Temp    Pulse 64 06/28/19 1538  Resp 18 06/28/19 1538  SpO2 100 % 06/28/19 1538  Vitals shown include unvalidated device data.  Last Pain:  Vitals:   06/28/19 1145  TempSrc: Oral  PainSc: 0-No pain      Patients Stated Pain Goal: 6 (92/76/[id number])  Complications: No apparent anesthesia complications

## 2019-06-28 NOTE — Progress Notes (Addendum)
PROGRESS NOTE    Derek Foster  ZOX:[id number] DOB: 1935/06/10 DOA: 06/25/2019 PCP: Celene Squibb, MD    Brief Narrative:Sky W [name] a 84 y.o.malewith a history of CAD with history of MI and stenting, A. fib on chronic anticoagulation, hypertension, hyperlipidemia, hypothyroidism, GERD, chronic indwelling urinary catheter, ventral hernia. Patient presents to the hospital with 2 days of generalized lower abdominal pain with coffee-ground emesis with a couple of episodes today. No diarrhea. Reports normal bowel movement yesterday. No hematuria, melena, hematochezia.  Emergency Department Course: CT of the abdomen/pelvis shows small bowel obstructions coming from ventral hernia with loop of bowel in the hernia. General surgery consulted.NG tube placed.   Assessment & Plan:   Principal Problem:   Small bowel obstruction (HCC) Active Problems:   Hyperthyroidism   Essential hypertension   CAD S/P percutaneous coronary angioplasty   Atrial fibrillation, chronic (HCC)   AKI (acute kidney injury) (HCC)   BPH (benign prostatic hyperplasia)   GERD without esophagitis   Anticoagulated   Candidal UTI (urinary tract infection)   Ventral hernia with bowel obstruction   #1 recurrent small bowel obstruction secondary to ventral hernia -status post small bowel resection and primary repair of the hernia 06/28/2019 by Dr. Constance Haw.    #2 history of A. fib was on Eliquis which is being on hold for surgery.  Continue amiodarone.  #3 history of CAD status post stent LAD PCI in 2014. Ejection fraction 60 to 65%.  #4 AKI creatinine 1.2 down from 1.7 on admission. Continue IV fluids  #5 history of BPH restart home medications Flomax and finasteride  #6 history of GERD on Protonix  #7 history of hyper thyroidism on methimazole restarted  #8 coffee-ground emesis reported by the patient check a Gastroccult and FOBT. Hemoglobin 10.1 down from 11.2 with no evidence of active bleeding  hemodilution could be playing a role too.  FOBT is positive however he is  not having any active bleeding will have her to follow-up with GI as an outpatient.  #9 Proteus UTI in the setting of chronic Foley catheter on cefazolin.    #10 hypokalemia replete potassium and check magnesium and recheck labs in a.m.  Pressure Injury 03/12/19 Buttocks Left Stage 2 -  Partial thickness loss of dermis presenting as a shallow open injury with a red, pink wound bed without slough. (Active)  03/12/19 0924  Location: Buttocks  Location Orientation: Left  Staging: Stage 2 -  Partial thickness loss of dermis presenting as a shallow open injury with a red, pink wound bed without slough.  Wound Description (Comments):   Present on Admission:      Estimated body mass index is 35.58 kg/m as calculated from the following:   Height as of this encounter: 6' (1.829 m).   Weight as of this encounter: 119 kg.  DVT prophylaxis:SCD Eliquis on hold for surgery. Code Status:Full code Family Communicationno family at bedside Disposition Plan:Status is: Inpatient Dispo: The patient is from: Anticipated d/c is to: Anticipated d/c date is: Patient currentlyis not medically stable to d/c.Patient admitted with recurrent bowel obstruction secondary to hernia ventral hernia with NG tube in place and is status post surgery today 06/28/2019.   Consultants:General surgery Discussed with GI Dr. Gala Romney over the phone  ProceduresNG tube 06/25/2019 Antimicrobials:  Anti-infectives (From admission, onward)   Start     Dose/Rate Route Frequency Ordered Stop   06/28/19 2200  [MAR Hold]  ceFAZolin (ANCEF) IVPB 1 g/50 mL premix     (MAR  Hold since Tue 06/28/2019 at 1125.Hold Reason: Transfer to a Procedural area.)   1 g 100 mL/hr over 30 Minutes Intravenous Every 12 hours 06/28/19 1109     06/28/19 0600  ceFAZolin (ANCEF) IVPB 2g/100 mL premix     2 g 200 mL/hr over 30  Minutes Intravenous On call to O.R. 06/27/19 2037 06/28/19 1309   06/27/19 1445  cefTRIAXone (ROCEPHIN) 1 g in sodium chloride 0.9 % 100 mL IVPB  Status:  Discontinued     1 g 200 mL/hr over 30 Minutes Intravenous Every 24 hours 06/27/19 1434 06/28/19 1107   06/26/19 1845  fluconazole (DIFLUCAN) IVPB 100 mg  Status:  Discontinued     100 mg 50 mL/hr over 60 Minutes Intravenous Every 24 hours 06/25/19 1832 06/26/19 1356   06/25/19 1845  fluconazole (DIFLUCAN) IVPB 200 mg     200 mg 100 mL/hr over 60 Minutes Intravenous  Once 06/25/19 1832 06/25/19 2030       Subjective:  Seen prior to surgery patient was anxious to have surgery done to avoid recurrent obstruction denied any new complaints no nausea vomiting Objective: Vitals:   06/27/19 2153 06/28/19 0645 06/28/19 1145 06/28/19 1534  BP: (!) 169/54 (!) 171/48 (!) 191/63 (!) 160/57  Pulse: 60 (!) 58 63 65  Resp: 20 18 12  (!) 21  Temp: (!) 100.4 F (38 C) 99.3 F (37.4 C) 98.4 F (36.9 C) 98.4 F (36.9 C)  TempSrc: Oral Oral Oral   SpO2: 96% 96% 96% 100%  Weight:      Height:        Intake/Output Summary (Last 24 hours) at 06/28/2019 1546 Last data filed at 06/28/2019 1526 Gross per 24 hour  Intake 3680 ml  Output 2350 ml  Net 1330 ml   Filed Weights   06/25/19 2330  Weight: 119 kg    Examination:  General exam: Appears calm and comfortable  Respiratory system: Clear to auscultation. Respiratory effort normal. Cardiovascular system: S1 & S2 heard, RRR. No JVD, murmurs, rubs, gallops or clicks. No pedal edema. Gastrointestinal system: Abdomen is nondistended, soft and nontender. No organomegaly or masses felt. Normal bowel sounds heard. Central nervous system: Alert and oriented. No focal neurological deficits. Extremities: Symmetric 5 x 5 power. Skin: No rashes, lesions or ulcers Psychiatry: Judgement and insight appear normal. Mood & affect appropriate.     Data Reviewed: I have personally reviewed following labs  and imaging studies  CBC: Recent Labs  Lab 06/25/19 1600 06/26/19 0414 06/28/19 0510  WBC 11.8* 10.0 7.2  NEUTROABS 9.2*  --   --   HGB 11.2* 10.1* 9.8*  HCT 35.1* 31.9* 30.5*  MCV 87.5 87.6 88.9  PLT 247 221 353   Basic Metabolic Panel: Recent Labs  Lab 06/25/19 1600 06/26/19 0414 06/28/19 0510  NA 140 141 139  K 3.8 3.4* 3.0*  CL 99 104 109  CO2 29 28 24   GLUCOSE 120* 94 112*  BUN 28* 28* 16  CREATININE 1.72* 1.63* 1.25*  CALCIUM 9.0 8.3* 7.9*   GFR: Estimated Creatinine Clearance: 60.7 mL/min (A) (by C-G formula based on SCr of 1.25 mg/dL (H)). Liver Function Tests: Recent Labs  Lab 06/25/19 1600 06/28/19 0510  AST 15 12*  ALT 13 15  ALKPHOS 89 76  BILITOT 0.9 0.4  PROT 7.0 5.2*  ALBUMIN 4.1 2.8*   Recent Labs  Lab 06/25/19 1600  LIPASE 18   No results for input(s): AMMONIA in the last 168 hours. Coagulation Profile: No results  for input(s): INR, PROTIME in the last 168 hours. Cardiac Enzymes: No results for input(s): CKTOTAL, CKMB, CKMBINDEX, TROPONINI in the last 168 hours. BNP (last 3 results) No results for input(s): PROBNP in the last 8760 hours. HbA1C: No results for input(s): HGBA1C in the last 72 hours. CBG: Recent Labs  Lab 06/26/19 1058  GLUCAP 91   Lipid Profile: No results for input(s): CHOL, HDL, LDLCALC, TRIG, CHOLHDL, LDLDIRECT in the last 72 hours. Thyroid Function Tests: No results for input(s): TSH, T4TOTAL, FREET4, T3FREE, THYROIDAB in the last 72 hours. Anemia Panel: No results for input(s): VITAMINB12, FOLATE, FERRITIN, TIBC, IRON, RETICCTPCT in the last 72 hours. Sepsis Labs: Recent Labs  Lab 06/25/19 1600  LATICACIDVEN 1.2    Recent Results (from the past 240 hour(s))  SARS Coronavirus 2 by RT PCR (hospital order, performed in [facility] hospital lab) Nasopharyngeal Nasopharyngeal Swab     Status: None   Collection Time: 06/25/19  5:32 PM   Specimen: Nasopharyngeal Swab  Result Value Ref Range Status   SARS  Coronavirus 2 NEGATIVE NEGATIVE Final    Comment: (NOTE) SARS-CoV-2 target nucleic acids are NOT DETECTED. The SARS-CoV-2 RNA is generally detectable in upper and lower respiratory specimens during the acute phase of infection. The lowest concentration of SARS-CoV-2 viral copies this assay can detect is 250 copies / mL. A negative result does not preclude SARS-CoV-2 infection and should not be used as the sole basis for treatment or other patient management decisions.  A negative result may occur with improper specimen collection / handling, submission of specimen other than nasopharyngeal swab, presence of viral mutation(s) within the areas targeted by this assay, and inadequate number of viral copies (<250 copies / mL). A negative result must be combined with clinical observations, patient history, and epidemiological information. Fact Sheet for Patients:   StrictlyIdeas.no Fact Sheet for Healthcare Providers: BankingDealers.co.za This test is not yet approved or cleared  by the [place] FDA and has been authorized for detection and/or diagnosis of SARS-CoV-2 by FDA under an Emergency Use Authorization (EUA).  This EUA will remain in effect (meaning this test can be used) for the duration of the COVID-19 declaration under Section 564(b)(1) of the Act, 21 U.S.C. section 360bbb-3(b)(1), unless the authorization is terminated or revoked sooner. Performed at [facility], [address], [place], [place] [address]   Urine culture     Status: Abnormal   Collection Time: 06/25/19  5:59 PM   Specimen: Urine, Catheterized  Result Value Ref Range Status   Specimen Description   Final    URINE, CATHETERIZED Performed at [facility], [address] [place], [place] [address]    Special Requests   Final    NONE Performed at [facility], [address] [place], [place] [address]    Culture >=100,000 COLONIES/mL PROTEUS MIRABILIS (A)   Final   Report Status 06/28/2019 FINAL  Final   Organism ID, Bacteria PROTEUS MIRABILIS (A)  Final      Susceptibility   Proteus mirabilis - MIC*    AMPICILLIN <=2 SENSITIVE Sensitive     CEFAZOLIN <=4 SENSITIVE Sensitive     CEFTRIAXONE <=1 SENSITIVE Sensitive     CIPROFLOXACIN 0.5 SENSITIVE Sensitive     GENTAMICIN <=1 SENSITIVE Sensitive     IMIPENEM 4 SENSITIVE Sensitive     NITROFURANTOIN 256 RESISTANT Resistant     TRIMETH/SULFA <=20 SENSITIVE Sensitive     AMPICILLIN/SULBACTAM <=2 SENSITIVE Sensitive     PIP/TAZO <=4 SENSITIVE Sensitive     * >=  100,000 COLONIES/mL PROTEUS MIRABILIS  Surgical pcr screen     Status: None   Collection Time: 06/27/19  8:39 PM   Specimen: Nasal Mucosa; Nasal Swab  Result Value Ref Range Status   MRSA, PCR NEGATIVE NEGATIVE Final   Staphylococcus aureus NEGATIVE NEGATIVE Final    Comment: (NOTE) The Xpert SA Assay (FDA approved for NASAL specimens in patients 24 years of age and older), is one component of a comprehensive surveillance program. It is not intended to diagnose infection nor to guide or monitor treatment. Performed at [facility], [address] [place], [place] [address]          Radiology Studies: DG Abd 1 View  Result Date: 06/27/2019 CLINICAL DATA:  Small-bowel obstruction, NGT removal yesterday EXAM: ABDOMEN - 1 VIEW COMPARISON:  Radiograph 06/26/2019, CT 06/25/2019 FINDINGS: Limited assessment for free air on supine radiography. There are several air filled loops of small bowel in the mid abdomen which are upper limits normal caliber though overall degree of distention is decreased from 1 day prior. Few air-filled loops of colon noted as well, nonspecific. Cholecystectomy clips in the right upper quadrant. No suspicious calcifications. Extensive degenerative changes in the spine, hips and pelvis are unchanged from comparison. No acute osseous abnormality. IMPRESSION: 1. Limited assessment for free air on supine radiography.  2. Several air filled loops of small bowel in the mid abdomen which are upper limits normal caliber though overall degree of distention is decreased from 1 day prior. Findings may reflect resolving small bowel obstruction or ileus. Electronically Signed   By: Lovena Le M.D.   On: 06/27/2019 05:16        Scheduled Meds: . [MAR Hold] amiodarone  200 mg Oral Daily  . [MAR Hold] Chlorhexidine Gluconate Cloth  6 each Topical Daily  . [MAR Hold] hydrALAZINE  10 mg Intravenous Once  . [MAR Hold] methimazole  20 mg Oral BID  . [MAR Hold] metoprolol tartrate  5 mg Intravenous Q6H  . [MAR Hold] nystatin   Topical TID  . [MAR Hold] ofloxacin  1 drop Both Eyes QID  . [MAR Hold] pantoprazole (PROTONIX) IV  40 mg Intravenous Q12H  . [MAR Hold] tamsulosin  0.4 mg Oral QPC breakfast  . [MAR Hold] traZODone  25 mg Oral QHS   Continuous Infusions: . sodium chloride 100 mL/hr at 06/28/19 0130  . [MAR Hold]  ceFAZolin (ANCEF) IV       LOS: 3 days     Georgette Shell, MD 06/28/2019, 3:46 PM

## 2019-06-28 NOTE — Interval H&P Note (Signed)
History and Physical Interval Note:  06/28/2019 8:39 AM  Derek Foster  has presented today for surgery, with the diagnosis of ventral hernia.  The various methods of treatment have been discussed with the patient and family. After consideration of risks, benefits and other options for treatment, the patient has consented to  Procedure(s): HERNIA REPAIR VENTRAL ADULT; open with mesh (N/A) as a surgical intervention.  The patient's history has been reviewed, patient examined, no change in status, stable for surgery.  I have reviewed the patient's chart and labs.  Questions were answered to the patient's satisfaction.    Recurrent hernia causing obstruction. He is doing better now. Cardiology has seen and no further workup needed. UTI and treated with rocephin, low grade fevers. Given the incarceration and obstruction, I think the benefit of proceeding in the setting of a treated UTI outweighs the risk.    Virl Cagey

## 2019-06-28 NOTE — Op Note (Addendum)
[facility] Operative Note  06/28/19  Preoperative Diagnosis:  Ventral hernia with recurrent small bowel obstruction   Postoperative Diagnosis: Same   Procedure(s) Performed: Primary ventral hernia repair, vicryl mesh onlay, small bowel reaction X 2, lysis of adhesions    Surgeon: Lanell Matar. Constance Haw, MD   Assistants: No qualified resident was available    Anesthesia: General endotracheal   Anesthesiologist: Denese Killings, MD    Specimens: Small bowel    Estimated Blood Loss: Minimal   Blood Replacement: None    Complications: None   Wound Class: Clean contaminated    Operative Indications:  Mr. Amick is a 84 yo with prior ventral hernia repair with mesh who has a recurrence left of midline with two defects. The superior defect is about 2cm with omentum, and the inferior defect is about 4cm with small bowel.  He presented to the hospital with a SBO. This hernia was reduced and his obstructive symptoms resolved after his admission.  Given the recurrent issue with obstruction, we had cardiology evaluate him for perioperative risk and he was deemed a moderate risk, and he opted to proceed with hernia repair given the fact that this is his 2nd time in the hospital with an obstruction related to the hernia.  We discussed the risk of bleeding, infection, use of mesh, injury to bowel, inability to use mesh if the bowel is injured, and risk of recurrence.    Findings: Two ventral hernia defects, adherent small bowel to the medial side of bowel, adherent to the prior mesh, lysis of adhesions with serosal tear and enterotomy due to the fusion of the mesh with the bowel, proximal resected area with some degree of stricture with bowel dilated more proximal    Procedure: The patient was taken to the operating room and placed supine. General endotracheal anesthesia was induced. Intravenous antibiotics were administered per protocol.  An orogastric tube positioned to  decompress the stomach. The abdomen was prepared and draped in the usual sterile fashion.   Left of midline was the location of his two palpable hernias that had recurred. An incision was made over these two areas and was carried down through to the subcutaneous tissue with electrocautery. Care was taken when encountering the hernia sac to no enter it. The fascia was cleared around both hernia sacs. The superior hernia was separate from the inferior hernia by about a 4cm bridge of fascia. The superior hernia had fatty contents that would not reduce fully. The inferior hernia had small bowel that would not reduce.  Given this the hernia sacs were opened. With care, the bowel and fatty tissue were reduced back into the abdomen. The hernia sac and adherent omentum were taken from the inferior hernia with Ligasure. After the two defects were reduced and cleared, I did a finger sweep and there was adhesions of bowel to the medial side of the incision consistent with the location of the prior mesh edge.  I opened up the bridge between the two defects to allow for better exposure.  I would not be able to do a closure and ensure that the bowel would not get trapped or cause another obstruction given the adhesion to the mesh.   With great care the adhesions of the small bowel to the anterior abdominal wall and the mesh were taken down with scissor dissection and cautery.  Minor serosal tears were made and closed with Lembert 3-0 silk suture.  This was done carefully, but a loop of bowel  was very adherent and basically fused to the mesh/ incorporated.  An enterotomy was made and closed with a 3-0 silk suture and oversewn again with a 3-0 Lembert silk suture to prevent contamination.  I was able to get the bowel down from the mesh and the anterior abdominal wall. The bowel was ran from distal to proximal. The distal bowel was healthy and normal caliber. There was a section of bowel with the enterotomy that had been  essentially incorporated with the mesh that did not look healthy enough to leave.  Additionally 50 cm from this proximally there were additional serosal tears that were closed and a area of stricture where the bowel had been chronically incarcerated in the inferior hernia.  Given the appearance of the bowel and fear of a stricture more proximally. I opted to do two bowel resections (as they were 50cm separating the two areas).    The two areas of small bowel were resected in the standard fashion using 75 mm linear cutting staplers proximally and distally (total of four locations), and the mesentery was taken with a Ligasure. Two antimesenteric side to side anastomosis were done with 75 mm linear cutting staplers and the enterotomies were closed with two TA 60 mm loads. The staple lines were oversewn with 3-0 Silk Lembert suture.  The mesenteric defects were closed with 3-0 Vicryl. The two portions of bowel were about 8cm and 10 cm each and included all of the serosal tears, the enterotomy, and the area concerning for a stricture. The portion of bowel between the two resections again totalled 50 cm.  I had hoped to only have one anastomosis but due to the length of bowel between the areas this was not possible.    The abdomen was irrigated with warm saline. Final inspection revealed acceptable hemostasis. There was no omentum to lie over the bowel.  Given the contamination, I did not want to use a permanent synthetic mesh.  An NG was placed and confirmed with palpation. I opted to close his abdomen primarily with interrupted 0.5cm bites using 0 PDS suture. The abdominal cavity closed nicely with no tension as the patient had some laxity and diastasis recti.  A vicryl absorbable mesh onlay was cut to about 6X4cm and covered the incision well. The subcutaneous tissue was irrigated. The deep space was closed over the mesh with 3-0 Vicryl interrupted. The skin was closed with staples and a sterile honeycomb dressing  was placed.   All counts were correct at the end of the case. The patient was awakened from anesthesia and extubated without complication.  The patient went to the PACU in stable condition.   Curlene Labrum, MD [facility] [address] [place], [place] [address] [phone] (office)

## 2019-06-28 NOTE — Progress Notes (Signed)
[facility]  Spoke with son, Ulice Dash, that surgery is completed. Small bowel resection done and primary repair of hernia with vicryl overlay.   Plan for stepdown admission overnight. Monitoring NG in place for decompression until bowel function returns Abdominal binder PRN for pain control  Curlene Labrum, MD [facility] [address] [place], [place] [address] [phone] (office)

## 2019-06-28 NOTE — Anesthesia Postprocedure Evaluation (Signed)
Anesthesia Post Note  Patient: Derek Foster  Procedure(s) Performed: HERNIA REPAIR VENTRAL ADULT WITH VICRYL MESH OVERLAY (N/A Abdomen) LYSIS OF ADHESION (N/A Abdomen) SMALL BOWEL RESECTION (N/A Abdomen)  Patient location during evaluation: PACU Anesthesia Type: General Level of consciousness: awake, awake and alert, patient cooperative and oriented Pain management: pain level not controlled Vital Signs Assessment: post-procedure vital signs reviewed and stable Respiratory status: spontaneous breathing, nonlabored ventilation, respiratory function stable and patient connected to face mask oxygen Cardiovascular status: blood pressure returned to baseline and stable Postop Assessment: no headache and no backache Anesthetic complications: no     Last Vitals:  Vitals:   06/28/19 0645 06/28/19 1145  BP: (!) 171/48 (!) 191/63  Pulse: (!) 58 63  Resp: 18 12  Temp: 37.4 C 36.9 C  SpO2: 96% 96%    Last Pain:  Vitals:   06/28/19 1145  TempSrc: Oral  PainSc: 0-No pain                 Tacy Learn

## 2019-06-29 DIAGNOSIS — I25119 Atherosclerotic heart disease of native coronary artery with unspecified angina pectoris: Secondary | ICD-10-CM

## 2019-06-29 DIAGNOSIS — I480 Paroxysmal atrial fibrillation: Secondary | ICD-10-CM

## 2019-06-29 LAB — COMPREHENSIVE METABOLIC PANEL
ALT: 13 U/L (ref 0–44)
AST: 12 U/L — ABNORMAL LOW (ref 15–41)
Albumin: 2.8 g/dL — ABNORMAL LOW (ref 3.5–5.0)
Alkaline Phosphatase: 74 U/L (ref 38–126)
Anion gap: 9 (ref 5–15)
BUN: 13 mg/dL (ref 8–23)
CO2: 24 mmol/L (ref 22–32)
Calcium: 8.2 mg/dL — ABNORMAL LOW (ref 8.9–10.3)
Chloride: 106 mmol/L (ref 98–111)
Creatinine, Ser: 1.15 mg/dL (ref 0.61–1.24)
GFR calc Af Amer: >60 mL/min (ref >60)
GFR calc non Af Amer: 59 mL/min — ABNORMAL LOW (ref >60)
Glucose, Bld: 118 mg/dL — ABNORMAL HIGH (ref 70–99)
Potassium: 3.4 mmol/L — ABNORMAL LOW (ref 3.5–5.1)
Sodium: 139 mmol/L (ref 135–145)
Total Bilirubin: 0.6 mg/dL (ref 0.3–1.2)
Total Protein: 5.7 g/dL — ABNORMAL LOW (ref 6.5–8.1)

## 2019-06-29 LAB — CBC
HCT: 31.9 % — ABNORMAL LOW (ref 39.0–52.0)
Hemoglobin: 10.4 g/dL — ABNORMAL LOW (ref 13.0–17.0)
MCH: 28.4 pg (ref 26.0–34.0)
MCHC: 32.6 g/dL (ref 30.0–36.0)
MCV: 87.2 fL (ref 80.0–100.0)
Platelets: 202 10*3/uL (ref 150–400)
RBC: 3.66 MIL/uL — ABNORMAL LOW (ref 4.22–5.81)
RDW: 14.8 % (ref 11.5–15.5)
WBC: 10.4 10*3/uL (ref 4.0–10.5)
nRBC: 0 % (ref 0.0–0.2)

## 2019-06-29 LAB — MAGNESIUM: Magnesium: 1.8 mg/dL (ref 1.7–2.4)

## 2019-06-29 MED ORDER — METOPROLOL TARTRATE 50 MG PO TABS
25.0000 mg | ORAL_TABLET | Freq: Two times a day (BID) | ORAL | Status: DC
  Administered 2019-06-29 (×2): 25 mg via ORAL
  Filled 2019-06-29 (×2): qty 1

## 2019-06-29 MED ORDER — CEFAZOLIN SODIUM-DEXTROSE 1-4 GM/50ML-% IV SOLN
1.0000 g | Freq: Three times a day (TID) | INTRAVENOUS | Status: DC
  Administered 2019-06-29 – 2019-06-30 (×3): 1 g via INTRAVENOUS
  Filled 2019-06-29 (×4): qty 50

## 2019-06-29 MED ORDER — POTASSIUM CHLORIDE CRYS ER 20 MEQ PO TBCR
40.0000 meq | EXTENDED_RELEASE_TABLET | Freq: Once | ORAL | Status: AC
  Administered 2019-06-29: 40 meq via ORAL
  Filled 2019-06-29: qty 2

## 2019-06-29 MED ORDER — FINASTERIDE 5 MG PO TABS
5.0000 mg | ORAL_TABLET | Freq: Every day | ORAL | Status: DC
  Administered 2019-06-29 – 2019-07-04 (×6): 5 mg via ORAL
  Filled 2019-06-29 (×6): qty 1

## 2019-06-29 MED ORDER — HYDRALAZINE HCL 10 MG PO TABS
10.0000 mg | ORAL_TABLET | Freq: Three times a day (TID) | ORAL | Status: DC
  Administered 2019-06-29 – 2019-07-04 (×15): 10 mg via ORAL
  Filled 2019-06-29 (×15): qty 1

## 2019-06-29 MED ORDER — PRAVASTATIN SODIUM 10 MG PO TABS
10.0000 mg | ORAL_TABLET | Freq: Every day | ORAL | Status: DC
  Administered 2019-06-29 – 2019-07-03 (×5): 10 mg via ORAL
  Filled 2019-06-29 (×5): qty 1

## 2019-06-29 NOTE — Progress Notes (Signed)
Progress Note  Patient Name: Derek Foster Date of Encounter: 06/29/2019  Primary Cardiologist: Skeet Latch, MD  Subjective   No abdominal pain or chest pain, no shortness of breath.  Still has NG tube in place, but clamped for oral medication.  Inpatient Medications    Scheduled Meds: . amiodarone  200 mg Oral Daily  . Chlorhexidine Gluconate Cloth  6 each Topical Daily  . methimazole  20 mg Oral BID  . metoprolol tartrate  5 mg Intravenous Q6H  . nystatin   Topical TID  . ofloxacin  1 drop Both Eyes QID  . pantoprazole (PROTONIX) IV  40 mg Intravenous Q12H  . tamsulosin  0.4 mg Oral QPC breakfast  . traZODone  25 mg Oral QHS   Continuous Infusions: . sodium chloride 100 mL/hr at 06/29/19 0335  .  ceFAZolin (ANCEF) IV 1 g (06/28/19 2221)   PRN Meds: hydrALAZINE, morphine injection, ondansetron **OR** ondansetron (ZOFRAN) IV   Vital Signs    Vitals:   06/29/19 0508 06/29/19 0600 06/29/19 0723 06/29/19 0800  BP: (!) 193/44 (!) 168/39  (!) 173/42  Pulse:  (!) 57 (!) 57 (!) 54  Resp:  16 15 16   Temp:   98.2 F (36.8 C)   TempSrc:   Oral   SpO2:  93% 95% 94%  Weight:      Height:        Intake/Output Summary (Last 24 hours) at 06/29/2019 0827 Last data filed at 06/28/2019 2000 Gross per 24 hour  Intake 2280 ml  Output 1050 ml  Net 1230 ml   Filed Weights   06/25/19 2330 06/29/19 0500  Weight: 119 kg 119.8 kg    Telemetry    Sinus rhythm.  Personally reviewed.  ECG    An ECG dated 06/27/2019 was personally reviewed today and demonstrated:  Normal sinus rhythm.  Physical Exam   GEN:  Obese male, no acute distress.  NG tube in place. Neck: No JVD. Cardiac: RRR, soft systolic murmur, no gallop. Respiratory: Nonlabored. Clear to auscultation bilaterally. MS: No edema; No deformity.  Labs    Chemistry Recent Labs  Lab 06/25/19 1600 06/26/19 0414 06/28/19 0510 06/28/19 1808 06/29/19 0353  NA 140   < > 139 138 139  K 3.8   < > 3.0* 3.4* 3.4*    CL 99   < > 109 105 106  CO2 29   < > 24 23 24   GLUCOSE 120*   < > 112* 157* 118*  BUN 28*   < > 16 13 13   CREATININE 1.72*   < > 1.25* 1.27* 1.15  CALCIUM 9.0   < > 7.9* 8.0* 8.2*  PROT 7.0  --  5.2*  --  5.7*  ALBUMIN 4.1  --  2.8*  --  2.8*  AST 15  --  12*  --  12*  ALT 13  --  15  --  13  ALKPHOS 89  --  76  --  74  BILITOT 0.9  --  0.4  --  0.6  GFRNONAA 36*   < > 53* 52* 59*  GFRAA 42*   < > >60 >60 >60  ANIONGAP 12   < > 6 10 9    < > = values in this interval not displayed.     Hematology Recent Labs  Lab 06/26/19 0414 06/28/19 0510 06/29/19 0353  WBC 10.0 7.2 10.4  RBC 3.64* 3.43* 3.66*  HGB 10.1* 9.8* 10.4*  HCT 31.9* 30.5*  31.9*  MCV 87.6 88.9 87.2  MCH 27.7 28.6 28.4  MCHC 31.7 32.1 32.6  RDW 14.9 14.9 14.8  PLT 221 162 202    Radiology    No results found.  Cardiac Studies   Echocardiogram 02/13/2019: 1. Left ventricular ejection fraction, by visual estimation, is 60 to  65%. The left ventricle has normal function. There is moderately increased  left ventricular hypertrophy.  2. Definity contrast agent was given IV to delineate the left ventricular  endocardial borders.  3. Left ventricular diastolic parameters are indeterminate.  4. The left ventricle has no regional wall motion abnormalities.  5. Global right ventricle has normal systolic function.The right  ventricular size is normal. Right vetricular wall thickness was not  assessed.  6. Left atrial size was severely dilated.  7. Right atrial size was normal.  8. The mitral valve is grossly normal. No evidence of mitral valve  regurgitation.  9. The tricuspid valve is grossly normal.  10. The tricuspid valve is grossly normal. Tricuspid valve regurgitation  is mild.  11. The aortic valve is tricuspid. Aortic valve regurgitation is not  visualized. No evidence of aortic valve sclerosis or stenosis.  12. The pulmonic valve was grossly normal. Pulmonic valve regurgitation is  not  visualized.  13. Mildly elevated pulmonary artery systolic pressure.  14. The interatrial septum was not well visualized.   Patient Profile     84 y.o. male with a history of CAD, hx MI PCI to LAD 2011, HTN, HLD, PAF with COVID-19 and Small bowel obstruction 01/2019.  Now followed perioperatively in the setting of ventral hernia repair.  Assessment & Plan    1.  CAD status post DES to the LAD in 2011, no active angina at this time. LVEF 60 to 65% by echocardiogram in January of this year.  On Lopressor and Pravachol as an outpatient, not on aspirin given use of Eliquis.  2.  Paroxysmal atrial fibrillation with CHA2DS2-VASc score of 4.  He remains in sinus rhythm, Eliquis has been on hold with recent surgery and he is on divided dose IV Lopressor.  3.  Postop day #2 status post ventral hernia here with lysis of adhesions under general anesthesia.  Discussed with nursing, patient reportedly able to take p.o. medications at this point.  Continue amiodarone, resume Pravachol, change from IV Lopressor to oral Lopressor (start 25 mg twice daily and uptitrate to 50 mg twice daily if tolerated).  Eliquis can be resumed when approved by surgical team.  Not resuming digoxin or Lasix at this time.  He was also on Cardizem CD 180 mg daily as an outpatient, follow vital signs prior to resuming.  Signed, Rozann Lesches, MD  06/29/2019, 8:27 AM

## 2019-06-29 NOTE — Progress Notes (Signed)
[facility] Progress Note  1 Day Post-Op  Subjective: No flatus or BM. Minimal output in NG. Says he is sore. Does not want to do PT today. Told PT to try again tomorrow and patient agrees.   Objective: Vital signs in last 24 hours: Temp:  [98.2 F (36.8 C)-98.9 F (37.2 C)] 98.2 F (36.8 C) (06/09 0723) Pulse Rate:  [52-73] 52 (06/09 1000) Resp:  [11-21] 11 (06/09 1000) BP: (93-210)/(39-78) 179/43 (06/09 1000) SpO2:  [93 %-100 %] 96 % (06/09 1000) Weight:  [119.8 kg] 119.8 kg (06/09 0500) Last BM Date: 06/26/19  Intake/Output from previous day: 06/08 0701 - 06/09 0700 In: 2280 [I.V.:2180; IV Piggyback:100] Out: 1050 [Urine:950; Emesis/NG output:50; Blood:50] Intake/Output this shift: Total I/O In: 1804.1 [I.V.:1720; IV Piggyback:84.1] Out: -   General appearance: alert, cooperative and no distress Resp: normal work of breathing GI: soft, mildly distended, appropriately tender, staple line c/d/i with honeycomb, mild erythema spattered more like irritation and not like cellulitis, will monitor closely  Lab Results:  Recent Labs    06/28/19 0510 06/29/19 0353  WBC 7.2 10.4  HGB 9.8* 10.4*  HCT 30.5* 31.9*  PLT 162 202   BMET Recent Labs    06/28/19 1808 06/29/19 0353  NA 138 139  K 3.4* 3.4*  CL 105 106  CO2 23 24  GLUCOSE 157* 118*  BUN 13 13  CREATININE 1.27* 1.15  CALCIUM 8.0* 8.2*   Anti-infectives: Anti-infectives (From admission, onward)   Start     Dose/Rate Route Frequency Ordered Stop   06/29/19 0900  ceFAZolin (ANCEF) IVPB 1 g/50 mL premix     1 g 100 mL/hr over 30 Minutes Intravenous Every 8 hours 06/29/19 0834     06/28/19 2200  ceFAZolin (ANCEF) IVPB 1 g/50 mL premix  Status:  Discontinued     1 g 100 mL/hr over 30 Minutes Intravenous Every 12 hours 06/28/19 1109 06/29/19 0834   06/28/19 0600  ceFAZolin (ANCEF) IVPB 2g/100 mL premix     2 g 200 mL/hr over 30 Minutes Intravenous On call to O.R. 06/27/19 2037 06/28/19 1309    06/27/19 1445  cefTRIAXone (ROCEPHIN) 1 g in sodium chloride 0.9 % 100 mL IVPB  Status:  Discontinued     1 g 200 mL/hr over 30 Minutes Intravenous Every 24 hours 06/27/19 1434 06/28/19 1107   06/26/19 1845  fluconazole (DIFLUCAN) IVPB 100 mg  Status:  Discontinued     100 mg 50 mL/hr over 60 Minutes Intravenous Every 24 hours 06/25/19 1832 06/26/19 1356   06/25/19 1845  fluconazole (DIFLUCAN) IVPB 200 mg     200 mg 100 mL/hr over 60 Minutes Intravenous  Once 06/25/19 1832 06/25/19 2030      Assessment/Plan: [name] is an 84 yo POD 1 s/p primary ventral hernia repair with vicryl mesh onlay, small bowel resection. Doing fair. Had HTN overnight. PRN for pain IS, OOB On cardiac meds NG and can have meds with sips  Monitor abdominal staples, minor erythema looking like irritation, but will monitor closely   LOS: 4 days    Derek Foster 06/29/2019

## 2019-06-29 NOTE — Progress Notes (Signed)
PROGRESS NOTE    Derek Foster  YOV:[id number] DOB: 17-Apr-1935 DOA: 06/25/2019 PCP: Celene Squibb, MD    Brief Narrative:Derek Foster a 84 y.o.malewith a history of CAD with history of MI and stenting, A. fib on chronic anticoagulation, hypertension, hyperlipidemia, hypothyroidism, GERD, chronic indwelling urinary catheter, ventral hernia. Patient presents to the hospital with 2 days of generalized lower abdominal pain with coffee-ground emesis with a couple of episodes today. No diarrhea. Reports normal bowel movement yesterday. No hematuria, melena, hematochezia.  Emergency Department Course: CT of the abdomen/pelvis shows small bowel obstructions coming from ventral hernia with loop of bowel in the hernia. General surgery consulted.NG tube placed.   Assessment & Plan:   Principal Problem:   Small bowel obstruction (HCC) Active Problems:   Hyperthyroidism   Essential hypertension   CAD S/P percutaneous coronary angioplasty   Atrial fibrillation, chronic (HCC)   AKI (acute kidney injury) (HCC)   BPH (benign prostatic hyperplasia)   GERD without esophagitis   Anticoagulated   Candidal UTI (urinary tract infection)   Ventral hernia with bowel obstruction   #1 recurrent small bowel obstruction secondary to ventral hernia  -status post small bowel resection and primary repair of the hernia 06/28/2019 by Dr. Constance Haw.  -Follow postoperative recommendation -Continue to be n.p.o. with NG tube in place; concerns for ileus. -ok to have oral meds and sips.  #2 history of A. fib was on Eliquis prior to admission -resume when able as per surgery rec's.   -Continue amiodarone and metoprolol. -continue telemetry monitoring.  #3 history of CAD status post stent LAD PCI in 2014.  -Ejection fraction 60 to 65%. -continue b-blocker. -continue follow up with cardiology.  #4 AKI  -creatinine 1.15 down from 1.7 on admission.  -Continue IV fluids -follow Cr trend  #5 history  of BPH -No complaints of urinary retention symptoms -Continue Flomax and finasteride.    #6 history of GERD  -continue PPI  #7 history of hyper thyroidism -Continue methimazole.   #8 coffee-ground emesis reported by the patient prior to admission -Hemoglobin 10.1 down from 11.2 with no evidence of active bleeding hemodilution could be playing a role.   -FOBT is positive however he is  not having any active bleeding will have her to follow-up with GI as an outpatient. -continue PPI  #9 Proteus UTI in the setting of chronic Foley catheter -continue cefazolin (day 4/7).    #10 hypokalemia  -Continue to replete as needed -Plan is to maintain potassium above 4 and magnesium above 2.  #11 pressure injury: present on admission Pressure Injury 03/12/19 Buttocks Left Stage 2 -  Partial thickness loss of dermis presenting as a shallow open injury with a red, pink wound bed without slough. (Active)  03/12/19 0924  Location: Buttocks  Location Orientation: Left  Staging: Stage 2 -  Partial thickness loss of dermis presenting as a shallow open injury with a red, pink wound bed without slough.  Wound Description (Comments):   Present on Admission:      Estimated body mass index is 35.82 kg/m as calculated from the following:   Height as of this encounter: 6' (1.829 m).   Weight as of this encounter: 119.8 kg.  DVT prophylaxis:SCD Eliquis on hold for surgery. Code Status:Full code Family Communicationno family at bedside Disposition Plan:Remains inpatient.  Continue slowly advancing diet and following postoperative recommendations by general surgery.  Continue resuming oral medications as tolerated and continue IV fluids/electrolyte repletion.  Currently with ileus and no medically  ready for discharge.  Dispo: The patient is from: Home Anticipated d/c is to: To be determined Anticipated d/c date is: to be determined.  Patient  currentlyis not medically stable to d/c.Patient admitted with recurrent bowel obstruction secondary to hernia ventral hernia with NG tube in place and is status post surgery on 06/28/2019.  No passing gas or bowel movements yet; concerns for possible ileus.   Consultants: General surgery Cardiology. Care discussed with GI (Dr. Gala Romney) over the phone: recommended outpatient follow-up after discharge.  Continue PPI.  ProceduresNG tube 06/25/2019 Antimicrobials:  Anti-infectives (From admission, onward)   Start     Dose/Rate Route Frequency Ordered Stop   06/29/19 0900  ceFAZolin (ANCEF) IVPB 1 g/50 mL premix     1 g 100 mL/hr over 30 Minutes Intravenous Every 8 hours 06/29/19 0834     06/28/19 2200  ceFAZolin (ANCEF) IVPB 1 g/50 mL premix  Status:  Discontinued     1 g 100 mL/hr over 30 Minutes Intravenous Every 12 hours 06/28/19 1109 06/29/19 0834   06/28/19 0600  ceFAZolin (ANCEF) IVPB 2g/100 mL premix     2 g 200 mL/hr over 30 Minutes Intravenous On call to O.R. 06/27/19 2037 06/28/19 1309   06/27/19 1445  cefTRIAXone (ROCEPHIN) 1 g in sodium chloride 0.9 % 100 mL IVPB  Status:  Discontinued     1 g 200 mL/hr over 30 Minutes Intravenous Every 24 hours 06/27/19 1434 06/28/19 1107   06/26/19 1845  fluconazole (DIFLUCAN) IVPB 100 mg  Status:  Discontinued     100 mg 50 mL/hr over 60 Minutes Intravenous Every 24 hours 06/25/19 1832 06/26/19 1356   06/25/19 1845  fluconazole (DIFLUCAN) IVPB 200 mg     200 mg 100 mL/hr over 60 Minutes Intravenous  Once 06/25/19 1832 06/25/19 2030       Subjective: Afebrile, no chest pain, no nausea or vomiting.  Still with NG tube in place and tolerating just sips with some of his oral medications.  Reports having expected  abdominal pain, no flatus, no bowel movements.  Objective: Vitals:   06/29/19 1200 06/29/19 1300 06/29/19 1400 06/29/19 1614  BP: (!) 168/40 (!) 180/54 (!) 190/48   Pulse: (!) 49 (!) 53 (!) 53 63  Resp: 14 13 15 14     Temp:    98 F (36.7 C)  TempSrc:    Oral  SpO2: 96% 97% 98% 96%  Weight:      Height:        Intake/Output Summary (Last 24 hours) at 06/29/2019 1708 Last data filed at 06/29/2019 1043 Gross per 24 hour  Intake 1804.13 ml  Output 400 ml  Net 1404.13 ml   Filed Weights   06/25/19 2330 06/29/19 0500  Weight: 119 kg 119.8 kg    Examination: General exam: Alert, awake, oriented x 3, no chest pain, no shortness of breath, good spirit.  NG tube in place.  No flatus, no bowel movement. Respiratory system: Clear to auscultation. Respiratory effort normal. Cardiovascular system:Rate control; no rubs, no gallops, no appreciated JVD on exam. Gastrointestinal system: Abdomen is mildly distended tender to palpation around incision.  Clean dressings in place.  No drainage or signs of superimposed infection.  No bowel sounds appreciated on examination. Central nervous system: Alert and oriented. No focal neurological deficits. Extremities: No cyanosis or clubbing. Skin: No no petechiae; prior to admission a stage II sacral pressure injury appreciated without signs of superimposed infection. Psychiatry: Judgement and insight appear normal. Mood & affect  appropriate.    Data Reviewed: I have personally reviewed following labs and imaging studies  CBC: Recent Labs  Lab 06/25/19 1600 06/26/19 0414 06/28/19 0510 06/29/19 0353  WBC 11.8* 10.0 7.2 10.4  NEUTROABS 9.2*  --   --   --   HGB 11.2* 10.1* 9.8* 10.4*  HCT 35.1* 31.9* 30.5* 31.9*  MCV 87.5 87.6 88.9 87.2  PLT 247 221 162 242   Basic Metabolic Panel: Recent Labs  Lab 06/25/19 1600 06/26/19 0414 06/28/19 0510 06/28/19 1808 06/29/19 0353  NA 140 141 139 138 139  K 3.8 3.4* 3.0* 3.4* 3.4*  CL 99 104 109 105 106  CO2 29 28 24 23 24   GLUCOSE 120* 94 112* 157* 118*  BUN 28* 28* 16 13 13   CREATININE 1.72* 1.63* 1.25* 1.27* 1.15  CALCIUM 9.0 8.3* 7.9* 8.0* 8.2*  MG  --   --   --  1.9 1.8   GFR: Estimated Creatinine  Clearance: 66.2 mL/min (by C-G formula based on SCr of 1.15 mg/dL). Liver Function Tests: Recent Labs  Lab 06/25/19 1600 06/28/19 0510 06/29/19 0353  AST 15 12* 12*  ALT 13 15 13   ALKPHOS 89 76 74  BILITOT 0.9 0.4 0.6  PROT 7.0 5.2* 5.7*  ALBUMIN 4.1 2.8* 2.8*   Recent Labs  Lab 06/25/19 1600  LIPASE 18   CBG: Recent Labs  Lab 06/26/19 1058  GLUCAP 91    Sepsis Labs: Recent Labs  Lab 06/25/19 1600  LATICACIDVEN 1.2    Recent Results (from the past 240 hour(s))  SARS Coronavirus 2 by RT PCR (hospital order, performed in [facility] hospital lab) Nasopharyngeal Nasopharyngeal Swab     Status: None   Collection Time: 06/25/19  5:32 PM   Specimen: Nasopharyngeal Swab  Result Value Ref Range Status   SARS Coronavirus 2 NEGATIVE NEGATIVE Final    Comment: (NOTE) SARS-CoV-2 target nucleic acids are NOT DETECTED. The SARS-CoV-2 RNA is generally detectable in upper and lower respiratory specimens during the acute phase of infection. The lowest concentration of SARS-CoV-2 viral copies this assay can detect is 250 copies / mL. A negative result does not preclude SARS-CoV-2 infection and should not be used as the sole basis for treatment or other patient management decisions.  A negative result may occur with improper specimen collection / handling, submission of specimen other than nasopharyngeal swab, presence of viral mutation(s) within the areas targeted by this assay, and inadequate number of viral copies (<250 copies / mL). A negative result must be combined with clinical observations, patient history, and epidemiological information. Fact Sheet for Patients:   StrictlyIdeas.no Fact Sheet for Healthcare Providers: BankingDealers.co.za This test is not yet approved or cleared  by the [place] FDA and has been authorized for detection and/or diagnosis of SARS-CoV-2 by FDA under an Emergency Use Authorization (EUA).   This EUA will remain in effect (meaning this test can be used) for the duration of the COVID-19 declaration under Section 564(b)(1) of the Act, 21 U.S.C. section 360bbb-3(b)(1), unless the authorization is terminated or revoked sooner. Performed at [facility], [address], [place], [place] [address]   Urine culture     Status: Abnormal   Collection Time: 06/25/19  5:59 PM   Specimen: Urine, Catheterized  Result Value Ref Range Status   Specimen Description   Final    URINE, CATHETERIZED Performed at [facility], [address] [place], [place] [address]    Special Requests   Final  NONE Performed at [facility], [address], [place], [place] [address]    Culture >=100,000 COLONIES/mL PROTEUS MIRABILIS (A)  Final   Report Status 06/28/2019 FINAL  Final   Organism ID, Bacteria PROTEUS MIRABILIS (A)  Final      Susceptibility   Proteus mirabilis - MIC*    AMPICILLIN <=2 SENSITIVE Sensitive     CEFAZOLIN <=4 SENSITIVE Sensitive     CEFTRIAXONE <=1 SENSITIVE Sensitive     CIPROFLOXACIN 0.5 SENSITIVE Sensitive     GENTAMICIN <=1 SENSITIVE Sensitive     IMIPENEM 4 SENSITIVE Sensitive     NITROFURANTOIN 256 RESISTANT Resistant     TRIMETH/SULFA <=20 SENSITIVE Sensitive     AMPICILLIN/SULBACTAM <=2 SENSITIVE Sensitive     PIP/TAZO <=4 SENSITIVE Sensitive     * >=100,000 COLONIES/mL PROTEUS MIRABILIS  Surgical pcr screen     Status: None   Collection Time: 06/27/19  8:39 PM   Specimen: Nasal Mucosa; Nasal Swab  Result Value Ref Range Status   MRSA, PCR NEGATIVE NEGATIVE Final   Staphylococcus aureus NEGATIVE NEGATIVE Final    Comment: (NOTE) The Xpert SA Assay (FDA approved for NASAL specimens in patients 10 years of age and older), is one component of a comprehensive surveillance program. It is not intended to diagnose infection nor to guide or monitor treatment. Performed at [facility], [address] [place], [place] [address]       Radiology Studies: No  results found.   Scheduled Meds: . amiodarone  200 mg Oral Daily  . Chlorhexidine Gluconate Cloth  6 each Topical Daily  . methimazole  20 mg Oral BID  . metoprolol tartrate  25 mg Oral BID  . nystatin   Topical TID  . ofloxacin  1 drop Both Eyes QID  . pantoprazole (PROTONIX) IV  40 mg Intravenous Q12H  . pravastatin  10 mg Oral q1800  . tamsulosin  0.4 mg Oral QPC breakfast  . traZODone  25 mg Oral QHS   Continuous Infusions: . sodium chloride 100 mL/hr at 06/29/19 1319  .  ceFAZolin (ANCEF) IV 100 mL/hr at 06/29/19 1043     LOS: 4 days     Barton Dubois, MD 06/29/2019, 5:08 PM

## 2019-06-29 NOTE — Progress Notes (Signed)
PT Cancellation Note  Patient Details Name: Derek Foster MRN: [id number] DOB: 06-02-1935   Cancelled Treatment:    Reason Eval/Treat Not Completed: Patient declined to due pain and soreness. Patient had recently received pain medicines. Physician entered room and verified patient was ok to evaluate for PT tomorrow. PT will follow up for PT evaluation tomorrow, 06/29/19.   Floria Raveling. Hartnett-Rands, MS, PT Per [place] [phone] 06/29/2019, 1:10 PM

## 2019-06-29 NOTE — Care Management Important Message (Signed)
Important Message  Patient Details  Name: Derek Foster MRN: [id number] Date of Birth: 15-Sep-1935   Medicare Important Message Given:  Yes     Tommy Medal 06/29/2019, 1:52 PM

## 2019-06-30 ENCOUNTER — Inpatient Hospital Stay (HOSPITAL_COMMUNITY): Payer: PPO

## 2019-06-30 DIAGNOSIS — K56609 Unspecified intestinal obstruction, unspecified as to partial versus complete obstruction: Secondary | ICD-10-CM

## 2019-06-30 DIAGNOSIS — I2510 Atherosclerotic heart disease of native coronary artery without angina pectoris: Secondary | ICD-10-CM

## 2019-06-30 DIAGNOSIS — Z9861 Coronary angioplasty status: Secondary | ICD-10-CM

## 2019-06-30 LAB — BASIC METABOLIC PANEL
Anion gap: 8 (ref 5–15)
BUN: 11 mg/dL (ref 8–23)
CO2: 23 mmol/L (ref 22–32)
Calcium: 7.8 mg/dL — ABNORMAL LOW (ref 8.9–10.3)
Chloride: 109 mmol/L (ref 98–111)
Creatinine, Ser: 1.08 mg/dL (ref 0.61–1.24)
GFR calc Af Amer: >60 mL/min (ref >60)
GFR calc non Af Amer: >60 mL/min (ref >60)
Glucose, Bld: 97 mg/dL (ref 70–99)
Potassium: 3.4 mmol/L — ABNORMAL LOW (ref 3.5–5.1)
Sodium: 140 mmol/L (ref 135–145)

## 2019-06-30 LAB — CBC
HCT: 30.4 % — ABNORMAL LOW (ref 39.0–52.0)
Hemoglobin: 9.8 g/dL — ABNORMAL LOW (ref 13.0–17.0)
MCH: 28.2 pg (ref 26.0–34.0)
MCHC: 32.2 g/dL (ref 30.0–36.0)
MCV: 87.4 fL (ref 80.0–100.0)
Platelets: 190 10*3/uL (ref 150–400)
RBC: 3.48 MIL/uL — ABNORMAL LOW (ref 4.22–5.81)
RDW: 15.2 % (ref 11.5–15.5)
WBC: 9.5 10*3/uL (ref 4.0–10.5)
nRBC: 0 % (ref 0.0–0.2)

## 2019-06-30 LAB — EXPECTORATED SPUTUM ASSESSMENT W GRAM STAIN, RFLX TO RESP C

## 2019-06-30 LAB — MAGNESIUM: Magnesium: 1.8 mg/dL (ref 1.7–2.4)

## 2019-06-30 LAB — MRSA PCR SCREENING: MRSA by PCR: NEGATIVE

## 2019-06-30 LAB — SURGICAL PATHOLOGY

## 2019-06-30 LAB — BRAIN NATRIURETIC PEPTIDE: B Natriuretic Peptide: 132 pg/mL — ABNORMAL HIGH (ref 0.0–100.0)

## 2019-06-30 MED ORDER — FUROSEMIDE 10 MG/ML IJ SOLN
40.0000 mg | Freq: Once | INTRAMUSCULAR | Status: AC
  Administered 2019-06-30: 40 mg via INTRAVENOUS
  Filled 2019-06-30: qty 4

## 2019-06-30 MED ORDER — PIPERACILLIN-TAZOBACTAM 3.375 G IVPB
3.3750 g | Freq: Three times a day (TID) | INTRAVENOUS | Status: DC
  Administered 2019-06-30 – 2019-07-03 (×9): 3.375 g via INTRAVENOUS
  Filled 2019-06-30 (×9): qty 50

## 2019-06-30 MED ORDER — ACETYLCYSTEINE 20 % IN SOLN: 4.0000 mL | Freq: Three times a day (TID) | RESPIRATORY_TRACT | Status: DC

## 2019-06-30 MED ORDER — METOPROLOL TARTRATE 5 MG/5ML IV SOLN
5.0000 mg | Freq: Once | INTRAVENOUS | Status: AC
  Administered 2019-06-30: 5 mg via INTRAVENOUS
  Filled 2019-06-30: qty 5

## 2019-06-30 MED ORDER — SODIUM CHLORIDE 0.9 % IV SOLN
500.0000 mg | INTRAVENOUS | Status: DC
  Administered 2019-06-30 – 2019-07-03 (×4): 500 mg via INTRAVENOUS
  Filled 2019-06-30 (×4): qty 500

## 2019-06-30 MED ORDER — PIPERACILLIN-TAZOBACTAM 3.375 G IVPB 30 MIN
3.3750 g | INTRAVENOUS | Status: AC
  Administered 2019-06-30: 3.375 g via INTRAVENOUS
  Filled 2019-06-30 (×2): qty 50

## 2019-06-30 MED ORDER — POTASSIUM CHLORIDE 10 MEQ/100ML IV SOLN
10.0000 meq | INTRAVENOUS | Status: AC
  Administered 2019-06-30 (×2): 10 meq via INTRAVENOUS
  Filled 2019-06-30 (×2): qty 100

## 2019-06-30 MED ORDER — POTASSIUM CHLORIDE IN NACL 20-0.9 MEQ/L-% IV SOLN: INTRAVENOUS | Status: DC

## 2019-06-30 MED ORDER — SODIUM CHLORIDE 0.9 % IV SOLN: 2.0000 g | INTRAVENOUS | Status: DC

## 2019-06-30 MED ORDER — METOPROLOL TARTRATE 50 MG PO TABS
50.0000 mg | ORAL_TABLET | Freq: Two times a day (BID) | ORAL | Status: DC
  Administered 2019-06-30 – 2019-07-04 (×8): 50 mg via ORAL
  Filled 2019-06-30 (×9): qty 1

## 2019-06-30 MED ORDER — POTASSIUM CHLORIDE CRYS ER 20 MEQ PO TBCR
40.0000 meq | EXTENDED_RELEASE_TABLET | Freq: Once | ORAL | Status: AC
  Administered 2019-06-30: 40 meq via ORAL
  Filled 2019-06-30: qty 2

## 2019-06-30 NOTE — Evaluation (Signed)
Physical Therapy Evaluation Patient Details Name: Derek Foster MRN: [id number] DOB: April 20, 1935 Today's Date: 06/30/2019   History of Present Illness  Derek Foster is a 84 y.o. male with a history of CAD with history of MI and stenting, A. fib on chronic anticoagulation, hypertension, hyperlipidemia, hypothyroidism, GERD, chronic indwelling urinary catheter, ventral hernia.  Patient presents to the hospital with 2 days of generalized lower abdominal pain with coffee-ground emesis with a couple of episodes today.  No diarrhea.  Reports normal bowel movement yesterday.  No hematuria, melena, hematochezia.    Clinical Impression  Patient is s/p above surgery resulting in functional limitations due to the deficits listed below (see PT Problem List). Patient agreeable to participating in PT evaluation today. Abdominal binder on patient throughout session. Patient requires mod assist for bed mobility with HOB maximally elevated. Patient requires min assist to transfer sit to stand from an elevated surface. Patient unable to transfer from a standard bed height. Once standing patient is able to ambulate with RW with min guard short distances at this time. Patient sleeps in his lift chair at home.  Due to patient's stature, standard width RW and BSC too narrow. Patient will benefit from skilled PT to increase their independence and safety with mobility to allow discharge to the venue listed below.         Follow Up Recommendations SNF;Home health PT;Supervision for mobility/OOB;Supervision - Intermittent    Equipment Recommendations  Other (comment) (Bariatric walker with 5" wheels and a bariatric BSC)    Recommendations for Other Services       Precautions / Restrictions Precautions Precautions: Fall Precaution Comments: one fall in driveway; post COVID recovery Restrictions Weight Bearing Restrictions: No      Mobility  Bed Mobility Overal bed mobility: Needs Assistance Bed Mobility: Supine  to Sit     Supine to sit: Mod assist;HOB elevated     General bed mobility comments: slow, labored, HOB maximally elevated  Transfers Overall transfer level: Needs assistance Equipment used: Rolling walker (2 wheeled) Transfers: Sit to/from [facility] Sit to Stand: Min assist;From elevated surface Stand pivot transfers: Min guard       General transfer comment: slow, labored, patient unable to sit to stand from standard bed without surface elevated  Ambulation/Gait Ambulation/Gait assistance: Min guard Gait Distance (Feet): 25 Feet Assistive device: Rolling walker (2 wheeled) Gait Pattern/deviations: Step-through pattern;Decreased step length - right;Decreased step length - left;Decreased stride length;Trunk flexed;Wide base of support Gait velocity: decreased   General Gait Details: slow, labored gait with RW, significant trunk/spinal flexion, cues to walk within base of support but walker too narrow to fit hips through between hands holds.  Stairs            Wheelchair Mobility    Modified Rankin (Stroke Patients Only)       Balance Overall balance assessment: Needs assistance Sitting-balance support: Bilateral upper extremity supported;Feet supported Sitting balance-Leahy Scale: Good     Standing balance support: Bilateral upper extremity supported;During functional activity Standing balance-Leahy Scale: Fair Standing balance comment: fair with RW                             Pertinent Vitals/Pain Pain Assessment: 0-10 Pain Score: 3  Pain Descriptors / Indicators: Sore Pain Intervention(s): Limited activity within patient's tolerance;Monitored during session    Home Living Family/patient expects to be discharged to:: Private residence Living Arrangements: Alone (wife recently passed from [place])  Available Help at Discharge: Family;Available PRN/intermittently (had HHPT and HH nursing PTA) Type of Home: House Home Access:  Ramped entrance     Home Layout: Multi-level;Able to live on main level with bedroom/bathroom Home Equipment: Shower seat;Grab bars - toilet;Grab bars - tub/shower;Hand held shower head;Wheelchair - [employer] - 2 wheels;Other (comment) (lift chair) Additional Comments: has a bedside commode but he doesn't fit in it.    Prior Function Level of Independence: Needs assistance   Gait / Transfers Assistance Needed: ambulated household distances with RW  ADL's / Homemaking Assistance Needed: Independent with BADLs; assistance with IADLs; son shops for groceries and he and neighbor brings meals.        Hand Dominance   Dominant Hand: Right    Extremity/Trunk Assessment   Upper Extremity Assessment Upper Extremity Assessment: Generalized weakness    Lower Extremity Assessment Lower Extremity Assessment: Generalized weakness    Cervical / Trunk Assessment Cervical / Trunk Assessment: Normal  Communication   Communication: No difficulties  Cognition Arousal/Alertness: Awake/alert Behavior During Therapy: WFL for tasks assessed/performed Overall Cognitive Status: Within Functional Limits for tasks assessed                                        General Comments      Exercises     Assessment/Plan    PT Assessment Patient needs continued PT services  PT Problem List Decreased strength;Decreased mobility;Decreased activity tolerance;Decreased balance;Decreased knowledge of use of DME       PT Treatment Interventions DME instruction;Therapeutic activities;Gait training;Therapeutic exercise;Patient/family education;Balance training    PT Goals (Current goals can be found in the Care Plan section)  Acute Rehab PT Goals Patient Stated Goal: Go home with [facility] services again; not to rehab again. PT Goal Formulation: With patient Time For Goal Achievement: 07/14/19 Potential to Achieve Goals: Fair    Frequency Min 3X/week   Barriers to discharge         Co-evaluation               AM-PAC PT "6 Clicks" Mobility  Outcome Measure Help needed turning from your back to your side while in a flat bed without using bedrails?: A Lot Help needed moving from lying on your back to sitting on the side of a flat bed without using bedrails?: A Lot Help needed moving to and from a bed to a chair (including a wheelchair)?: A Little Help needed standing up from a chair using your arms (e.g., wheelchair or bedside chair)?: A Lot Help needed to walk in hospital room?: A Little Help needed climbing 3-5 steps with a railing? : A Lot 6 Click Score: 14    End of Session   Activity Tolerance: Patient tolerated treatment well;Patient limited by fatigue Patient left: in chair;with call bell/phone within reach Nurse Communication: Mobility status PT Visit Diagnosis: Unsteadiness on feet (R26.81);History of falling (Z91.81);Other abnormalities of gait and mobility (R26.89);Muscle weakness (generalized) (M62.81)    Time: 9381-8299 PT Time Calculation (min) (ACUTE ONLY): 45 min   Charges:   PT Evaluation $PT Eval Moderate Complexity: 1 Mod PT Treatments $Gait Training: 8-22 mins $Self Care/Home Management: 8-22        Floria Raveling. Hartnett-Rands, MS, PT Per [place] [phone] 06/30/2019, 2:30 PM

## 2019-06-30 NOTE — NC FL2 (Signed)
[place] LEVEL OF CARE SCREENING TOOL     IDENTIFICATION  Patient Name: Derek Foster Birthdate: October 12, 1935 Sex: male Admission Date (Current Location): 06/25/2019  [facility], [facility], [facility] and [place] Number:  [employer] and Address:  [place] [address], [place]      Provider Number: [id number]  Attending Physician Name and Address:  Rodena Goldmann, DO  Relative Name and Phone Number:  Theoren Palka (daughter) [facility]: [phone]    Current Level of Care: Hospital Recommended Level of Care: [place] Prior Approval Number:    Date Approved/Denied:   PASRR Number: [id number]  Discharge Plan: SNF    Current Diagnoses: Patient Active Problem List   Diagnosis Date Noted  . Ventral hernia with bowel obstruction   . Small bowel obstruction ([place]) 06/25/2019  . Candidal UTI (urinary tract infection) 06/25/2019  . UTI (urinary tract infection) due to urinary indwelling catheter ([place]) 04/11/2019  . Pleural effusion 04/11/2019  . Radiculopathy 04/11/2019  . PAF (paroxysmal atrial fibrillation) ([place]) 04/11/2019  . Anticoagulated 04/07/2019  . BPH (benign prostatic hyperplasia) 03/28/2019  . GERD without esophagitis 03/28/2019  . Protein-calorie malnutrition, severe ([place]) 03/28/2019  . Palliative care by specialist   . DNR (do not resuscitate)   . Weakness generalized   . Grief   . Gross hematuria   . AKI (acute kidney injury) ([place]) 02/12/2019  . SBO (small bowel obstruction) ([place]) 02/12/2019  . CAD S/P percutaneous coronary angioplasty 02/11/2019  . Atrial fibrillation, chronic ([place]) 02/11/2019  . Mixed hyperlipidemia 09/10/2009  . EDEMA 02/27/2009  . Hyperthyroidism 02/26/2009  . Essential hypertension 02/26/2009  . MYOCARDIAL INFARCTION 02/26/2009    Orientation RESPIRATION BLADDER Height & Weight     Self, Time, Situation, Place  Normal Continent Weight: 264 lb 1.8 oz (119.8 kg) Height:  6' (182.9 cm)   BEHAVIORAL SYMPTOMS/MOOD NEUROLOGICAL BOWEL NUTRITION STATUS      Continent Diet (NPO except ice chips)  AMBULATORY STATUS COMMUNICATION OF NEEDS Skin   Limited Assist Verbally Surgical wounds, Other (Comment) (Ecchymosis: arms, legs (bilateral); MASD: abdomen, groin)                       Personal Care Assistance Level of Assistance  Bathing, Feeding, Dressing Bathing Assistance: Limited assistance Feeding assistance: Independent Dressing Assistance: Limited assistance     Functional Limitations Info  Sight, Hearing, Speech Sight Info: Impaired Hearing Info: Adequate Speech Info: Adequate    SPECIAL CARE FACTORS FREQUENCY  PT (By licensed PT)     PT Frequency: Min 3x's/week              Contractures Contractures Info: Not present    Additional Factors Info  Code Status, Allergies Code Status Info: Full Allergies Info: Indomethacin; Iodine-131           Current Medications (06/30/2019):  This is the current hospital active medication list Current Facility-Administered Medications  Medication Dose Route Frequency Provider Last Rate Last Admin  . acetylcysteine (MUCOMYST) 20 % nebulizer / oral solution 4 mL  4 mL Nebulization TID Manuella Ghazi, Pratik D, DO      . amiodarone (PACERONE) tablet 200 mg  200 mg Oral Daily Barton Dubois, MD   200 mg at 06/30/19 1013  . azithromycin (ZITHROMAX) 500 mg in sodium chloride 0.9 % 250 mL IVPB  500 mg Intravenous Q24H Reubin Milan, MD 250 mL/hr at 06/30/19 0438 500 mg at 06/30/19 0438  . Chlorhexidine Gluconate  Cloth 2 % PADS 6 each  6 each Topical Daily Barton Dubois, MD   6 each at 06/30/19 1016  . finasteride (PROSCAR) tablet 5 mg  5 mg Oral Daily Barton Dubois, MD   5 mg at 06/30/19 1011  . hydrALAZINE (APRESOLINE) tablet 10 mg  10 mg Oral Q8H Barton Dubois, MD   10 mg at 06/30/19 1515  . methimazole (TAPAZOLE) tablet 20 mg  20 mg Oral BID Barton Dubois, MD   20 mg at 06/30/19 1011  . metoprolol tartrate (LOPRESSOR)  tablet 50 mg  50 mg Oral BID Manuella Ghazi, Pratik D, DO   50 mg at 06/30/19 1010  . morphine 2 MG/ML injection 2 mg  2 mg Intravenous Q2H PRN Barton Dubois, MD   2 mg at 06/29/19 2217  . nystatin (MYCOSTATIN/NYSTOP) topical powder   Topical TID Barton Dubois, MD   Given at 06/30/19 1518  . ofloxacin (OCUFLOX) 0.3 % ophthalmic solution 1 drop  1 drop Both Eyes QID Barton Dubois, MD   1 drop at 06/30/19 1518  . ondansetron (ZOFRAN) tablet 4 mg  4 mg Oral Q6H PRN Barton Dubois, MD       Or  . ondansetron [facility]) injection 4 mg  4 mg Intravenous Q6H PRN Barton Dubois, MD   4 mg at 06/28/19 1557  . pantoprazole (PROTONIX) injection 40 mg  40 mg Intravenous Q12H Barton Dubois, MD   40 mg at 06/30/19 1012  . piperacillin-tazobactam (ZOSYN) IVPB 3.375 g  3.375 g Intravenous Q8H Franky Macho, RPH 12.5 mL/hr at 06/30/19 1514 3.375 g at 06/30/19 1514  . pravastatin (PRAVACHOL) tablet 10 mg  10 mg Oral q1800 Barton Dubois, MD   10 mg at 06/29/19 1700  . tamsulosin (FLOMAX) capsule 0.4 mg  0.4 mg Oral QPC breakfast Barton Dubois, MD   0.4 mg at 06/30/19 9628     Discharge Medications: Please see discharge summary for a list of discharge medications.  Relevant Imaging Results:  Relevant Lab Results:   Additional Information SSN#: [id number]  Sherie Don, LCSW

## 2019-06-30 NOTE — TOC Initial Note (Signed)
Transition of Care [facility]) - Initial/Assessment Note   Patient Details  Name: Derek Foster MRN: [id number] Date of Birth: [birth year]  [facility]) CM/SW Contact:    Sherie Don, LCSW Phone Number: 06/30/2019, 3:35 PM  Clinical Narrative: Patient is an 84 year old male who was admitted for small bowel obstruction. Re-admission prevention checklist completed as patient has a high re-admission score. PT recommendations indicate either SNF or 24/7 supervision at home. CSW called patient to discuss options. Patient agreeable to SNF even though he would prefer to discharge home. Patient's first choice is [facility], but gave verbal permission to CSW to refer him to more SNFs in [facility]. CSW completed FL2. FL2 faxed out.           Expected Discharge Plan: Skilled Nursing Facility Barriers to Discharge: Continued Medical Work up  Patient Goals and CMS Choice Patient states their goals for this hospitalization and ongoing recovery are:: Be able to return home CMS Medicare.gov Compare Post Acute Care list provided to:: Patient Choice offered to / list presented to : Patient  Expected Discharge Plan and Services Expected Discharge Plan: [place] In-house Referral: Clinical Social Work Post Acute Care Choice: [place] Living arrangements for the past 2 months: [place]  Prior Living Arrangements/Services Living arrangements for the past 2 months: [place] with:: Self Patient language and need for interpreter reviewed:: Yes Do you feel safe going back to the place where you live?: Yes      Need for Family Participation in Patient Care: No (Comment) Care giver support system in place?: Yes (comment) Derek Foster (daughter) PH: [phone]) Criminal Activity/Legal Involvement Pertinent to Current Situation/Hospitalization: No - Comment as needed  Activities of Daily Living Home Assistive Devices/Equipment: Environmental consultant (specify  type), Raised toilet seat with rails, Eyeglasses, Shower chair with back ADL Screening (condition at time of admission) Patient's cognitive ability adequate to safely complete daily activities?: Yes Is the patient deaf or have difficulty hearing?: No Does the patient have difficulty seeing, even when wearing glasses/contacts?: No Does the patient have difficulty concentrating, remembering, or making decisions?: No Patient able to express need for assistance with ADLs?: Yes Does the patient have difficulty dressing or bathing?: No Independently performs ADLs?: Yes (appropriate for developmental age) Does the patient have difficulty walking or climbing stairs?: Yes Weakness of Legs: Both Weakness of Arms/Hands: None  Permission Sought/Granted Permission sought to share information with : Facility Art therapist granted to share information with : Yes, Verbal Permission Granted Permission granted to share info w AGENCY: [facility] and other SNFs referred to  Emotional Assessment Appearance:: Appears stated age Attitude/Demeanor/Rapport: Engaged Affect (typically observed): Accepting, Appropriate Orientation: : Oriented to Place, Oriented to Self, Oriented to  Time, Oriented to Situation Alcohol / Substance Use: Not Applicable Psych Involvement: No (comment)  Admission diagnosis:  Small bowel obstruction ([place]) [K56.609] Patient Active Problem List   Diagnosis Date Noted  . Ventral hernia with bowel obstruction   . Small bowel obstruction ([place]) 06/25/2019  . Candidal UTI (urinary tract infection) 06/25/2019  . UTI (urinary tract infection) due to urinary indwelling catheter ([place]) 04/11/2019  . Pleural effusion 04/11/2019  . Radiculopathy 04/11/2019  . PAF (paroxysmal atrial fibrillation) ([place]) 04/11/2019  . Anticoagulated 04/07/2019  . BPH (benign prostatic hyperplasia) 03/28/2019  . GERD without esophagitis 03/28/2019  . Protein-calorie malnutrition, severe ([place])  03/28/2019  . Palliative care by specialist   . DNR (do not resuscitate)   .  Weakness generalized   . Grief   . Gross hematuria   . AKI (acute kidney injury) ([place]) 02/12/2019  . SBO (small bowel obstruction) ([place]) 02/12/2019  . CAD S/P percutaneous coronary angioplasty 02/11/2019  . Atrial fibrillation, chronic ([place]) 02/11/2019  . Mixed hyperlipidemia 09/10/2009  . EDEMA 02/27/2009  . Hyperthyroidism 02/26/2009  . Essential hypertension 02/26/2009  . MYOCARDIAL INFARCTION 02/26/2009   PCP:  Celene Squibb, MD Pharmacy:   [facility] [place] [address] [place] [address] Phone: [phone] Fax: [phone]  [facility] ([place]) [place] [address] Phone: [phone] Fax: [phone]  Readmission Risk Interventions Readmission Risk Prevention Plan 06/30/2019 03/11/2019  Transportation Screening Complete Complete  PCP or Specialist Appt within 3-5 Days Not Complete Not Complete  Not Complete comments SNF is expected discharge plan plan for SNF  HRI or Home Care Consult Complete Not Complete  HRI or Home Care Consult comments - plan for SNF  Social Work Consult for [place] Planning/Counseling Complete Complete  Palliative Care Screening Not Applicable Complete  Medication Review (RN Care Manager) Complete Referral to Pharmacy  Some recent data might be hidden

## 2019-06-30 NOTE — Progress Notes (Signed)
Pharmacy Antibiotic Note  Derek Foster is a 84 y.o. male admitted on 06/25/2019 with asp pna.  Pharmacy has been consulted for Zosyn dosing. Pt also on azithromycin.  Plan: Zosyn 3.375gm IV q8h F/u renal function, micro data, and pt's clinical condition   Height: 6' (182.9 cm) Weight: 119.8 kg (264 lb 1.8 oz) IBW/kg (Calculated) : 77.6  Temp (24hrs), Avg:98.3 F (36.8 C), Min:97.8 F (36.6 C), Max:99.5 F (37.5 C)  Recent Labs  Lab 06/25/19 1600 06/26/19 0414 06/28/19 0510 06/28/19 1808 06/29/19 0353  WBC 11.8* 10.0 7.2  --  10.4  CREATININE 1.72* 1.63* 1.25* 1.27* 1.15  LATICACIDVEN 1.2  --   --   --   --     Estimated Creatinine Clearance: 66.2 mL/min (by C-G formula based on SCr of 1.15 mg/dL).    Allergies  Allergen Reactions  . Indomethacin Anaphylaxis    But tolerates ibuprofen, aleve  . Iodine-131 Anaphylaxis    Antimicrobials this admission: 6/10 Zosyn >>  6/10 Azithromycin >>    Thank you for allowing pharmacy to be a part of this patient's care.  Sherlon Handing, PharmD, BCPS Please see amion for complete clinical pharmacist phone list 06/30/2019 4:19 AM

## 2019-06-30 NOTE — Progress Notes (Signed)
Patient complains of congestion for 2 days, has wet cough, low grade temperature. Dr. Olevia Bowens paged. New orders placed.

## 2019-06-30 NOTE — Plan of Care (Signed)
  Problem: Acute Rehab PT Goals(only PT should resolve) Goal: Pt will Roll Supine to Side Outcome: Progressing Flowsheets (Taken 06/30/2019 1435) Pt will Roll Supine to Side: with min assist Goal: Pt Will Go Supine/Side To Sit Outcome: Progressing Flowsheets (Taken 06/30/2019 1435) Pt will go Supine/Side to Sit: with moderate assist Goal: Pt Will Go Sit To Supine/Side Outcome: Progressing Flowsheets (Taken 06/30/2019 1435) Pt will go Sit to Supine/Side: with moderate assist Goal: Patient Will Transfer Sit To/From Stand Outcome: Progressing Flowsheets (Taken 06/30/2019 1435) Patient will transfer sit to/from stand:  with minimal assist  from elevated surface Goal: Pt Will Transfer Bed To Chair/Chair To Bed Outcome: Progressing Flowsheets (Taken 06/30/2019 1435) Pt will Transfer Bed to Chair/Chair to Bed: min guard assist Goal: Pt Will Ambulate Outcome: Progressing Flowsheets (Taken 06/30/2019 1435) Pt will Ambulate:  50 feet  with min guard assist  with rolling walker   Pamala Hurry D. Hartnett-Rands, MS, PT Per [place] [phone] 06/30/2019

## 2019-06-30 NOTE — Progress Notes (Signed)
TRH night shift.  The staff reported the patient's blood pressure has been elevated.  He was given 2 doses of metoprolol 5 mg IVP.  The patient has also been coughing and his O2 sat is 94%.  His cough is nonproductive.  He denies chest pain or dyspnea.  However, he complains of abdominal pain when coughing.  Lungs sound diminished, but no wheezing, rhonchi or crackles.  Chest radiograph shows new right infiltrate.  I have discontinued cefazolin, started the patient on Zosyn to cover anaerobes and Zithromax.  Tennis Must, MD.

## 2019-06-30 NOTE — Progress Notes (Signed)
[facility] Progress Note  2 Days Post-Op  Subjective: CXR with infiltrate overnight been having some cough and low grade fevers. Abdomen pain improving, flatus but no BM. NG with minimal output.   Objective: Vital signs in last 24 hours: Temp:  [97.8 F (36.6 C)-99.5 F (37.5 C)] 98.3 F (36.8 C) (06/10 0950) Pulse Rate:  [49-69] 69 (06/10 0950) Resp:  [13-20] 18 (06/10 0950) BP: (164-191)/(40-66) 181/62 (06/10 0950) SpO2:  [94 %-98 %] 96 % (06/10 0950) Last BM Date: 06/26/19  Intake/Output from previous day: 06/09 0701 - 06/10 0700 In: 4032 [P.O.:20; I.V.:3452.3; IV Piggyback:559.7] Out: 1850 [Urine:1700; Emesis/NG output:150] Intake/Output this shift: No intake/output data recorded.  General appearance: alert, cooperative and no distress Resp: normal work of breathing GI: soft, abdominal binder in place, incision c/d/i with honeycomb and some minor stable erythema that looks more like irritation, no drainage, monitoring, stable from POD 1  Lab Results:  Recent Labs    06/29/19 0353 06/30/19 0837  WBC 10.4 9.5  HGB 10.4* 9.8*  HCT 31.9* 30.4*  PLT 202 190   BMET Recent Labs    06/29/19 0353 06/30/19 0837  NA 139 140  K 3.4* 3.4*  CL 106 109  CO2 24 23  GLUCOSE 118* 97  BUN 13 11  CREATININE 1.15 1.08  CALCIUM 8.2* 7.8*   PT/INR No results for input(s): LABPROT, INR in the last 72 hours.  Studies/Results: DG CHEST PORT 1 VIEW  Result Date: 06/30/2019 CLINICAL DATA:  Cough. EXAM: PORTABLE CHEST 1 VIEW COMPARISON:  June 25, 2019 FINDINGS: The enteric tube extends below the left hemidiaphragm. The heart size is stable. Aortic calcifications are noted. There is no significant pleural effusion. There is a possible developing infiltrate in the medial right lower lung zone. There are old healed left-sided rib fractures. There is no acute displaced fracture. IMPRESSION: Possible new infiltrate in the medial right lower lung zone. Electronically  Signed   By: Constance Holster M.D.   On: 06/30/2019 03:47    Anti-infectives: Anti-infectives (From admission, onward)   Start     Dose/Rate Route Frequency Ordered Stop   06/30/19 1400  piperacillin-tazobactam (ZOSYN) IVPB 3.375 g     Discontinue     3.375 g 12.5 mL/hr over 240 Minutes Intravenous Every 8 hours 06/30/19 0422     06/30/19 0430  cefTRIAXone (ROCEPHIN) 2 g in sodium chloride 0.9 % 100 mL IVPB  Status:  Discontinued        2 g 200 mL/hr over 30 Minutes Intravenous Every 24 hours 06/30/19 0415 06/30/19 0416   06/30/19 0430  azithromycin (ZITHROMAX) 500 mg in sodium chloride 0.9 % 250 mL IVPB     Discontinue     500 mg 250 mL/hr over 60 Minutes Intravenous Every 24 hours 06/30/19 0415 07/05/19 0414   06/30/19 0430  piperacillin-tazobactam (ZOSYN) IVPB 3.375 g        3.375 g 100 mL/hr over 30 Minutes Intravenous STAT 06/30/19 0422 06/30/19 0614   06/29/19 0900  ceFAZolin (ANCEF) IVPB 1 g/50 mL premix  Status:  Discontinued        1 g 100 mL/hr over 30 Minutes Intravenous Every 8 hours 06/29/19 0834 06/30/19 0415   06/28/19 2200  ceFAZolin (ANCEF) IVPB 1 g/50 mL premix  Status:  Discontinued        1 g 100 mL/hr over 30 Minutes Intravenous Every 12 hours 06/28/19 1109 06/29/19 0834   06/28/19 0600  ceFAZolin (ANCEF) IVPB 2g/100 mL premix  2 g 200 mL/hr over 30 Minutes Intravenous On call to O.R. 06/27/19 2037 06/28/19 1309   06/27/19 1445  cefTRIAXone (ROCEPHIN) 1 g in sodium chloride 0.9 % 100 mL IVPB  Status:  Discontinued        1 g 200 mL/hr over 30 Minutes Intravenous Every 24 hours 06/27/19 1434 06/28/19 1107   06/26/19 1845  fluconazole (DIFLUCAN) IVPB 100 mg  Status:  Discontinued       "Followed by" Linked Group Details   100 mg 50 mL/hr over 60 Minutes Intravenous Every 24 hours 06/25/19 1832 06/26/19 1356   06/25/19 1845  fluconazole (DIFLUCAN) IVPB 200 mg       "Followed by" Linked Group Details   200 mg 100 mL/hr over 60 Minutes Intravenous  Once  06/25/19 1832 06/25/19 2030      Assessment/Plan: Mr. Sporn is a 84 yo POD 2 s/p primary ventral hernia repair with vicryl mesh onlay and SBR for SBO due to hernia. Doing well overall. Did have some cough and infiltrate on CXR and started on antibiotics for possible PNA. PRN for pain IS, OOB Work with PT NG out and sips and chips today RN to notify if has BM Will monitor incision, it has looked irritated since POD 1 and is stable, looks more like bruising/ skin irritation and not cellulitis, no drainage    Updated Dr. Manuella Ghazi and RN.    LOS: 5 days    Virl Cagey 06/30/2019

## 2019-06-30 NOTE — Progress Notes (Signed)
Progress Note  Patient Name: Derek Foster Date of Encounter: 06/30/2019  Primary Cardiologist: Skeet Latch, MD  Subjective   Feels congested weak NG tube irritating nose   Inpatient Medications    Scheduled Meds: . acetylcysteine  4 mL Nebulization TID  . amiodarone  200 mg Oral Daily  . Chlorhexidine Gluconate Cloth  6 each Topical Daily  . finasteride  5 mg Oral Daily  . hydrALAZINE  10 mg Oral Q8H  . methimazole  20 mg Oral BID  . metoprolol tartrate  50 mg Oral BID  . nystatin   Topical TID  . ofloxacin  1 drop Both Eyes QID  . pantoprazole (PROTONIX) IV  40 mg Intravenous Q12H  . pravastatin  10 mg Oral q1800  . tamsulosin  0.4 mg Oral QPC breakfast   Continuous Infusions: . 0.9 % NaCl with KCl 20 mEq / L 75 mL/hr at 06/30/19 0332  . azithromycin 500 mg (06/30/19 0438)  . piperacillin-tazobactam (ZOSYN)  IV     PRN Meds: morphine injection, ondansetron **OR** ondansetron (ZOFRAN) IV   Vital Signs    Vitals:   06/29/19 2203 06/30/19 0249 06/30/19 0342 06/30/19 0550  BP: (!) 187/66 (!) 179/62 (!) 164/50 (!) 165/55  Pulse: 69 69 63 63  Resp: 16 16 20 16   Temp: 97.9 F (36.6 C) 99.5 F (37.5 C) 98.4 F (36.9 C) 98.7 F (37.1 C)  TempSrc: Oral Oral Oral Oral  SpO2: 97% 94% 98% 95%  Weight:      Height:        Intake/Output Summary (Last 24 hours) at 06/30/2019 0803 Last data filed at 06/30/2019 0600 Gross per 24 hour  Intake 4032.03 ml  Output 1850 ml  Net 2182.03 ml   Filed Weights   06/25/19 2330 06/29/19 0500  Weight: 119 kg 119.8 kg    Telemetry    Sinus rhythm.  Personally reviewed.  ECG    An ECG dated 06/27/2019 was personally reviewed today and demonstrated:  Normal sinus rhythm.  Physical Exam   Elderly white male Post ventral hernia repair NG tube BS positive Basilar crackles  Edema in hands  S1/S2 no murmur   Labs    Chemistry Recent Labs  Lab 06/25/19 1600 06/26/19 0414 06/28/19 0510 06/28/19 1808  06/29/19 0353  NA 140   < > 139 138 139  K 3.8   < > 3.0* 3.4* 3.4*  CL 99   < > 109 105 106  CO2 29   < > 24 23 24   GLUCOSE 120*   < > 112* 157* 118*  BUN 28*   < > 16 13 13   CREATININE 1.72*   < > 1.25* 1.27* 1.15  CALCIUM 9.0   < > 7.9* 8.0* 8.2*  PROT 7.0  --  5.2*  --  5.7*  ALBUMIN 4.1  --  2.8*  --  2.8*  AST 15  --  12*  --  12*  ALT 13  --  15  --  13  ALKPHOS 89  --  76  --  74  BILITOT 0.9  --  0.4  --  0.6  GFRNONAA 36*   < > 53* 52* 59*  GFRAA 42*   < > >60 >60 >60  ANIONGAP 12   < > 6 10 9    < > = values in this interval not displayed.     Hematology Recent Labs  Lab 06/26/19 0414 06/28/19 0510 06/29/19 0353  WBC 10.0  7.2 10.4  RBC 3.64* 3.43* 3.66*  HGB 10.1* 9.8* 10.4*  HCT 31.9* 30.5* 31.9*  MCV 87.6 88.9 87.2  MCH 27.7 28.6 28.4  MCHC 31.7 32.1 32.6  RDW 14.9 14.9 14.8  PLT 221 162 202    Radiology    DG CHEST PORT 1 VIEW  Result Date: 06/30/2019 CLINICAL DATA:  Cough. EXAM: PORTABLE CHEST 1 VIEW COMPARISON:  June 25, 2019 FINDINGS: The enteric tube extends below the left hemidiaphragm. The heart size is stable. Aortic calcifications are noted. There is no significant pleural effusion. There is a possible developing infiltrate in the medial right lower lung zone. There are old healed left-sided rib fractures. There is no acute displaced fracture. IMPRESSION: Possible new infiltrate in the medial right lower lung zone. Electronically Signed   By: Constance Holster M.D.   On: 06/30/2019 03:47    Cardiac Studies   Echocardiogram 02/13/2019: 1. Left ventricular ejection fraction, by visual estimation, is 60 to  65%. The left ventricle has normal function. There is moderately increased  left ventricular hypertrophy.  2. Definity contrast agent was given IV to delineate the left ventricular  endocardial borders.  3. Left ventricular diastolic parameters are indeterminate.  4. The left ventricle has no regional wall motion abnormalities.  5.  Global right ventricle has normal systolic function.The right  ventricular size is normal. Right vetricular wall thickness was not  assessed.  6. Left atrial size was severely dilated.  7. Right atrial size was normal.  8. The mitral valve is grossly normal. No evidence of mitral valve  regurgitation.  9. The tricuspid valve is grossly normal.  10. The tricuspid valve is grossly normal. Tricuspid valve regurgitation  is mild.  11. The aortic valve is tricuspid. Aortic valve regurgitation is not  visualized. No evidence of aortic valve sclerosis or stenosis.  12. The pulmonic valve was grossly normal. Pulmonic valve regurgitation is  not visualized.  13. Mildly elevated pulmonary artery systolic pressure.  14. The interatrial septum was not well visualized.   Patient Profile     84 y.o. male with a history of CAD, hx MI PCI to LAD 2011, HTN, HLD, PAF with COVID-19 and Small bowel obstruction 01/2019.  Now followed perioperatively in the setting of ventral hernia repair.  Assessment & Plan    1.  CAD status post DES to the LAD in 2011, no active angina at this time. LVEF 60 to 65% by echocardiogram in January of this year.  On Lopressor and Pravachol as an outpatient, not on aspirin given use of Eliquis.  2.  Paroxysmal atrial fibrillation with CHA2DS2-VASc score of 4.  He remains in sinus rhythm, Eliquis has been on hold with recent surgery and he is on divided dose IV Lopressor.  3.  Postop day #2 status post ventral hernia here with lysis of adhesions under general anesthesia.  4. Pneumonia:  CXR with infiltrate on zosyn will give dose of lasix as he is up 2L with edema on exam BNP only 132   Signed, Jenkins Rouge, MD  06/30/2019, 8:03 AM

## 2019-06-30 NOTE — Progress Notes (Signed)
PROGRESS NOTE    Derek Foster  EUM:[id number] DOB: 1936/01/20 DOA: 06/25/2019 PCP: Celene Squibb, MD   Brief Narrative:  Derek Foster a 84 y.o.malewith a history of CAD with history of MI and stenting, A. fib on chronic anticoagulation, hypertension, hyperlipidemia, hypothyroidism, GERD, chronic indwelling urinary catheter, ventral hernia. Patient presents to the hospital with 2 days of generalized lower abdominal pain with coffee-ground emesis with a couple of episodes today. No diarrhea. Reports normal bowel movement yesterday. No hematuria, melena, hematochezia.  Emergency Department Course: CT of the abdomen/pelvis shows small bowel obstructions coming from ventral hernia with loop of bowel in the hernia. General surgery consulted.NG tube placed.   Assessment & Plan:   Principal Problem:   Small bowel obstruction (HCC) Active Problems:   Hyperthyroidism   Essential hypertension   CAD S/P percutaneous coronary angioplasty   Atrial fibrillation, chronic (HCC)   AKI (acute kidney injury) (HCC)   BPH (benign prostatic hyperplasia)   GERD without esophagitis   Anticoagulated   Candidal UTI (urinary tract infection)   Ventral hernia with bowel obstruction   #1 recurrent small bowel obstruction secondary to ventral hernia  -status post small bowel resection and primary repair of the hernia 06/28/2019 by Dr. Constance Haw.  -Follow postoperative recommendation -Patient to remain on sips with oral medications and ice chips today.  NG tube removed on 6/10 by general surgery  #2 history of A. fib was on Eliquis prior to admission -resume when able as per surgery rec's.   -Continue amiodarone and metoprolol. -continue telemetry monitoring.  #3 history of CAD status post stent LAD PCI in 2014.  -Ejection fraction 60 to 65%. -continue on metoprolol with dose increased to 50 mg twice daily given hypertension last night, continue to monitor -continue follow up with  cardiology.  #4 AKI  -creatinine 1.08 down from 1.7 on admission.  -Hold further IV fluid as fluid balance is currently positive and Lasix being given by cardiology since he is 2 L positive. -follow Cr trend  #5 history of BPH -No complaints of urinary retention symptoms -Continue Flomax and finasteride.    #6 history of GERD  -continue PPI  #7 history of hyper thyroidism -Continue methimazole.   #8 coffee-ground emesis reported by the patient prior to admission -Hemoglobin 10.1 down from 11.2 with no evidence of active bleeding hemodilution could be playing a role. -FOBT is positive however he is not having any active bleeding will have her to follow-up with GI as an outpatient. -continue PPI  #9 Proteus UTI in the setting of chronic Foley catheter -continue  now on Zosyn day 5/7 -Foley catheter to be exchanged as it has been 30 days.  #10 hypokalemia  -Replete with oral liquid today -Plan is to maintain potassium above 4 and magnesium above 2.  #11 pressure injury: present on admission Pressure Injury 03/12/19 Buttocks Left Stage 2 -  Partial thickness loss of dermis presenting as a shallow open injury with a red, pink wound bed without slough. (Active)  03/12/19 0924  Location: Buttocks  Location Orientation: Left  Staging: Stage 2 -  Partial thickness loss of dermis presenting as a shallow open injury with a red, pink wound bed without slough.  Wound Description (Comments):   Present on Admission:    #12 right-sided infiltrate with concern for aspiration pneumonia -Started on Zosyn as well as azithromycin empirically -Noted to have significant chest congestion for which Mucomyst has been prescribed as well as flutter valve  Estimated body  mass index is 35.82 kg/m as calculated from the following:   Height as of this encounter: 6' (1.829 m).   Weight as of this encounter: 119.8 kg.  DVT prophylaxis:SCD Eliquis on hold for surgery. Code  Status:Full code Family Communicationplan to call daughter on phone. Disposition Plan:Remains inpatient.  Continue slowly advancing diet and following postoperative recommendations by general surgery.  Continue resuming oral medications as tolerated and continue IV fluids/electrolyte repletion.  Currently with ileus and no medically ready for discharge.  Dispo: The patient is from: Home Anticipated d/c is to: To be determined Anticipated d/c date is: to be determined.  Patient currentlyis not medically stable to d/c.Patient admitted with recurrent bowel obstruction secondary to hernia ventral hernia with NG tube in place and is status post surgery on 06/28/2019.  No passing gas or bowel movements yet; concerns for possible ileus.   Consultants: General surgery Cardiology. Care discussed with GI (Dr. Gala Romney) over the phone: recommended outpatient follow-up after discharge.  Continue PPI.  ProceduresNG tube 06/25/2019  Antimicrobials:  Anti-infectives (From admission, onward)   Start     Dose/Rate Route Frequency Ordered Stop   06/30/19 1400  piperacillin-tazobactam (ZOSYN) IVPB 3.375 g     Discontinue     3.375 g 12.5 mL/hr over 240 Minutes Intravenous Every 8 hours 06/30/19 0422     06/30/19 0430  cefTRIAXone (ROCEPHIN) 2 g in sodium chloride 0.9 % 100 mL IVPB  Status:  Discontinued        2 g 200 mL/hr over 30 Minutes Intravenous Every 24 hours 06/30/19 0415 06/30/19 0416   06/30/19 0430  azithromycin (ZITHROMAX) 500 mg in sodium chloride 0.9 % 250 mL IVPB     Discontinue     500 mg 250 mL/hr over 60 Minutes Intravenous Every 24 hours 06/30/19 0415 07/05/19 0414   06/30/19 0430  piperacillin-tazobactam (ZOSYN) IVPB 3.375 g        3.375 g 100 mL/hr over 30 Minutes Intravenous STAT 06/30/19 0422 06/30/19 0614   06/29/19 0900  ceFAZolin (ANCEF) IVPB 1 g/50 mL premix  Status:  Discontinued        1 g 100 mL/hr over 30 Minutes Intravenous  Every 8 hours 06/29/19 0834 06/30/19 0415   06/28/19 2200  ceFAZolin (ANCEF) IVPB 1 g/50 mL premix  Status:  Discontinued        1 g 100 mL/hr over 30 Minutes Intravenous Every 12 hours 06/28/19 1109 06/29/19 0834   06/28/19 0600  ceFAZolin (ANCEF) IVPB 2g/100 mL premix        2 g 200 mL/hr over 30 Minutes Intravenous On call to O.R. 06/27/19 2037 06/28/19 1309   06/27/19 1445  cefTRIAXone (ROCEPHIN) 1 g in sodium chloride 0.9 % 100 mL IVPB  Status:  Discontinued        1 g 200 mL/hr over 30 Minutes Intravenous Every 24 hours 06/27/19 1434 06/28/19 1107   06/26/19 1845  fluconazole (DIFLUCAN) IVPB 100 mg  Status:  Discontinued       "Followed by" Linked Group Details   100 mg 50 mL/hr over 60 Minutes Intravenous Every 24 hours 06/25/19 1832 06/26/19 1356   06/25/19 1845  fluconazole (DIFLUCAN) IVPB 200 mg       "Followed by" Linked Group Details   200 mg 100 mL/hr over 60 Minutes Intravenous  Once 06/25/19 1832 06/25/19 2030       Subjective: Patient seen and evaluated today with increased chest congestion.  Chest x-ray overnight demonstrates new right-sided infiltrate for  which she has been placed on Zosyn.  He appears volume overloaded.  No bowel movement noted yet.  Objective: Vitals:   06/30/19 0249 06/30/19 0342 06/30/19 0550 06/30/19 0950  BP: (!) 179/62 (!) 164/50 (!) 165/55 (!) 181/62  Pulse: 69 63 63 69  Resp: 16 20 16 18   Temp: 99.5 F (37.5 C) 98.4 F (36.9 C) 98.7 F (37.1 C) 98.3 F (36.8 C)  TempSrc: Oral Oral Oral Oral  SpO2: 94% 98% 95% 96%  Weight:      Height:        Intake/Output Summary (Last 24 hours) at 06/30/2019 1138 Last data filed at 06/30/2019 0600 Gross per 24 hour  Intake 2227.9 ml  Output 1850 ml  Net 377.9 ml   Filed Weights   06/25/19 2330 06/29/19 0500  Weight: 119 kg 119.8 kg    Examination:  General exam: Appears calm and comfortable  Respiratory system: Clear to auscultation. Respiratory effort normal.  Currently on room  air. Cardiovascular system: S1 & S2 heard, RRR. No JVD, murmurs, rubs, gallops or clicks. No pedal edema. Gastrointestinal system: Abdomen with binder in place.  Incision appears clean dry and intact with some mild erythema.  Currently with NG tube present. Central nervous system: Alert and oriented. No focal neurological deficits. Extremities: Scant edema. Skin: No rashes, lesions or ulcers Psychiatry: Judgement and insight appear normal. Mood & affect appropriate.     Data Reviewed: I have personally reviewed following labs and imaging studies  CBC: Recent Labs  Lab 06/25/19 1600 06/26/19 0414 06/28/19 0510 06/29/19 0353 06/30/19 0837  WBC 11.8* 10.0 7.2 10.4 9.5  NEUTROABS 9.2*  --   --   --   --   HGB 11.2* 10.1* 9.8* 10.4* 9.8*  HCT 35.1* 31.9* 30.5* 31.9* 30.4*  MCV 87.5 87.6 88.9 87.2 87.4  PLT 247 221 162 202 174   Basic Metabolic Panel: Recent Labs  Lab 06/26/19 0414 06/28/19 0510 06/28/19 1808 06/29/19 0353 06/30/19 0348 06/30/19 0837  NA 141 139 138 139  --  140  K 3.4* 3.0* 3.4* 3.4*  --  3.4*  CL 104 109 105 106  --  109  CO2 28 24 23 24   --  23  GLUCOSE 94 112* 157* 118*  --  97  BUN 28* 16 13 13   --  11  CREATININE 1.63* 1.25* 1.27* 1.15  --  1.08  CALCIUM 8.3* 7.9* 8.0* 8.2*  --  7.8*  MG  --   --  1.9 1.8 1.8  --    GFR: Estimated Creatinine Clearance: 70.5 mL/min (by C-G formula based on SCr of 1.08 mg/dL). Liver Function Tests: Recent Labs  Lab 06/25/19 1600 06/28/19 0510 06/29/19 0353  AST 15 12* 12*  ALT 13 15 13   ALKPHOS 89 76 74  BILITOT 0.9 0.4 0.6  PROT 7.0 5.2* 5.7*  ALBUMIN 4.1 2.8* 2.8*   Recent Labs  Lab 06/25/19 1600  LIPASE 18   No results for input(s): AMMONIA in the last 168 hours. Coagulation Profile: No results for input(s): INR, PROTIME in the last 168 hours. Cardiac Enzymes: No results for input(s): CKTOTAL, CKMB, CKMBINDEX, TROPONINI in the last 168 hours. BNP (last 3 results) No results for input(s): PROBNP  in the last 8760 hours. HbA1C: No results for input(s): HGBA1C in the last 72 hours. CBG: Recent Labs  Lab 06/26/19 1058  GLUCAP 91   Lipid Profile: No results for input(s): CHOL, HDL, LDLCALC, TRIG, CHOLHDL, LDLDIRECT in the last  72 hours. Thyroid Function Tests: No results for input(s): TSH, T4TOTAL, FREET4, T3FREE, THYROIDAB in the last 72 hours. Anemia Panel: No results for input(s): VITAMINB12, FOLATE, FERRITIN, TIBC, IRON, RETICCTPCT in the last 72 hours. Sepsis Labs: Recent Labs  Lab 06/25/19 1600  LATICACIDVEN 1.2    Recent Results (from the past 240 hour(s))  SARS Coronavirus 2 by RT PCR (hospital order, performed in [facility] hospital lab) Nasopharyngeal Nasopharyngeal Swab     Status: None   Collection Time: 06/25/19  5:32 PM   Specimen: Nasopharyngeal Swab  Result Value Ref Range Status   SARS Coronavirus 2 NEGATIVE NEGATIVE Final    Comment: (NOTE) SARS-CoV-2 target nucleic acids are NOT DETECTED. The SARS-CoV-2 RNA is generally detectable in upper and lower respiratory specimens during the acute phase of infection. The lowest concentration of SARS-CoV-2 viral copies this assay can detect is 250 copies / mL. A negative result does not preclude SARS-CoV-2 infection and should not be used as the sole basis for treatment or other patient management decisions.  A negative result may occur with improper specimen collection / handling, submission of specimen other than nasopharyngeal swab, presence of viral mutation(s) within the areas targeted by this assay, and inadequate number of viral copies (<250 copies / mL). A negative result must be combined with clinical observations, patient history, and epidemiological information. Fact Sheet for Patients:   StrictlyIdeas.no Fact Sheet for Healthcare Providers: BankingDealers.co.za This test is not yet approved or cleared  by the [place] FDA and has been authorized  for detection and/or diagnosis of SARS-CoV-2 by FDA under an Emergency Use Authorization (EUA).  This EUA will remain in effect (meaning this test can be used) for the duration of the COVID-19 declaration under Section 564(b)(1) of the Act, 21 U.S.C. section 360bbb-3(b)(1), unless the authorization is terminated or revoked sooner. Performed at [facility], [address] [place], [place] [address]   Urine culture     Status: Abnormal   Collection Time: 06/25/19  5:59 PM   Specimen: Urine, Catheterized  Result Value Ref Range Status   Specimen Description   Final    URINE, CATHETERIZED Performed at [facility], [address], [place], [place] [address]    Special Requests   Final    NONE Performed at [facility], [address] [place], [place] [address]    Culture >=100,000 COLONIES/mL PROTEUS MIRABILIS (A)  Final   Report Status 06/28/2019 FINAL  Final   Organism ID, Bacteria PROTEUS MIRABILIS (A)  Final      Susceptibility   Proteus mirabilis - MIC*    AMPICILLIN <=2 SENSITIVE Sensitive     CEFAZOLIN <=4 SENSITIVE Sensitive     CEFTRIAXONE <=1 SENSITIVE Sensitive     CIPROFLOXACIN 0.5 SENSITIVE Sensitive     GENTAMICIN <=1 SENSITIVE Sensitive     IMIPENEM 4 SENSITIVE Sensitive     NITROFURANTOIN 256 RESISTANT Resistant     TRIMETH/SULFA <=20 SENSITIVE Sensitive     AMPICILLIN/SULBACTAM <=2 SENSITIVE Sensitive     PIP/TAZO <=4 SENSITIVE Sensitive     * >=100,000 COLONIES/mL PROTEUS MIRABILIS  Surgical pcr screen     Status: None   Collection Time: 06/27/19  8:39 PM   Specimen: Nasal Mucosa; Nasal Swab  Result Value Ref Range Status   MRSA, PCR NEGATIVE NEGATIVE Final   Staphylococcus aureus NEGATIVE NEGATIVE Final    Comment: (NOTE) The Xpert SA Assay (FDA approved for NASAL specimens in patients 57 years of age and older), is one  component of a comprehensive surveillance program. It is not intended to diagnose infection nor to guide or monitor treatment. Performed  at [facility], [address], [place], [place] [address]   MRSA PCR Screening     Status: None   Collection Time: 06/30/19  4:16 AM   Specimen: Nasopharyngeal  Result Value Ref Range Status   MRSA by PCR NEGATIVE NEGATIVE Final    Comment:        The GeneXpert MRSA Assay (FDA approved for NASAL specimens only), is one component of a comprehensive MRSA colonization surveillance program. It is not intended to diagnose MRSA infection nor to guide or monitor treatment for MRSA infections. Performed at [facility], [address] [place], [place] [address]   Culture, sputum-assessment     Status: None   Collection Time: 06/30/19  8:05 AM   Specimen: Sputum  Result Value Ref Range Status   Specimen Description SPUTUM  Final   Special Requests Immunocompromised  Final   Sputum evaluation   Final    Sputum specimen not acceptable for testing.  Please recollect.   NOTIFIED Hassell Done, D @1002  06/30/2019 BY JONES,T Performed at [facility], [address], [place],  [address]    Report Status 06/30/2019 FINAL  Final         Radiology Studies: DG CHEST PORT 1 VIEW  Result Date: 06/30/2019 CLINICAL DATA:  Cough. EXAM: PORTABLE CHEST 1 VIEW COMPARISON:  June 25, 2019 FINDINGS: The enteric tube extends below the left hemidiaphragm. The heart size is stable. Aortic calcifications are noted. There is no significant pleural effusion. There is a possible developing infiltrate in the medial right lower lung zone. There are old healed left-sided rib fractures. There is no acute displaced fracture. IMPRESSION: Possible new infiltrate in the medial right lower lung zone. Electronically Signed   By: Constance Holster M.D.   On: 06/30/2019 03:47        Scheduled Meds: . acetylcysteine  4 mL Nebulization TID  . amiodarone  200 mg Oral Daily  . Chlorhexidine Gluconate Cloth  6 each Topical Daily  . finasteride  5 mg Oral Daily  . hydrALAZINE  10 mg Oral Q8H  . methimazole  20 mg Oral  BID  . metoprolol tartrate  50 mg Oral BID  . nystatin   Topical TID  . ofloxacin  1 drop Both Eyes QID  . pantoprazole (PROTONIX) IV  40 mg Intravenous Q12H  . pravastatin  10 mg Oral q1800  . tamsulosin  0.4 mg Oral QPC breakfast   Continuous Infusions: . 0.9 % NaCl with KCl 20 mEq / L 75 mL/hr at 06/30/19 0332  . azithromycin 500 mg (06/30/19 0438)  . piperacillin-tazobactam (ZOSYN)  IV       LOS: 5 days    Time spent: 35 minutes    Verina Galeno Darleen Crocker, DO [facility]  If 7PM-7AM, please contact night-coverage [email or web address] 06/30/2019, 11:38 AM

## 2019-07-01 DIAGNOSIS — I482 Chronic atrial fibrillation, unspecified: Secondary | ICD-10-CM

## 2019-07-01 LAB — CBC
HCT: 27.4 % — ABNORMAL LOW (ref 39.0–52.0)
Hemoglobin: 8.8 g/dL — ABNORMAL LOW (ref 13.0–17.0)
MCH: 28.3 pg (ref 26.0–34.0)
MCHC: 32.1 g/dL (ref 30.0–36.0)
MCV: 88.1 fL (ref 80.0–100.0)
Platelets: 199 10*3/uL (ref 150–400)
RBC: 3.11 MIL/uL — ABNORMAL LOW (ref 4.22–5.81)
RDW: 15.5 % (ref 11.5–15.5)
WBC: 8.1 10*3/uL (ref 4.0–10.5)
nRBC: 0 % (ref 0.0–0.2)

## 2019-07-01 LAB — BASIC METABOLIC PANEL
Anion gap: 9 (ref 5–15)
BUN: 13 mg/dL (ref 8–23)
CO2: 25 mmol/L (ref 22–32)
Calcium: 7.7 mg/dL — ABNORMAL LOW (ref 8.9–10.3)
Chloride: 105 mmol/L (ref 98–111)
Creatinine, Ser: 1.37 mg/dL — ABNORMAL HIGH (ref 0.61–1.24)
GFR calc Af Amer: 55 mL/min — ABNORMAL LOW (ref >60)
GFR calc non Af Amer: 48 mL/min — ABNORMAL LOW (ref >60)
Glucose, Bld: 99 mg/dL (ref 70–99)
Potassium: 3.1 mmol/L — ABNORMAL LOW (ref 3.5–5.1)
Sodium: 139 mmol/L (ref 135–145)

## 2019-07-01 LAB — MAGNESIUM: Magnesium: 1.8 mg/dL (ref 1.7–2.4)

## 2019-07-01 MED ORDER — GUAIFENESIN ER 600 MG PO TB12
600.0000 mg | ORAL_TABLET | Freq: Two times a day (BID) | ORAL | Status: DC
  Administered 2019-07-01 – 2019-07-04 (×7): 600 mg via ORAL
  Filled 2019-07-01 (×7): qty 1

## 2019-07-01 MED ORDER — POTASSIUM CHLORIDE CRYS ER 20 MEQ PO TBCR
40.0000 meq | EXTENDED_RELEASE_TABLET | Freq: Two times a day (BID) | ORAL | Status: AC
  Administered 2019-07-01 (×2): 40 meq via ORAL
  Filled 2019-07-01 (×2): qty 2

## 2019-07-01 NOTE — Progress Notes (Signed)
States passing gas and had liquid bm yesterday with no blood.  Denies nausea.  Started on clear liquids.  Abd binder in place and honeycomb dressing dry and intact. Abd tender to touch.

## 2019-07-01 NOTE — Care Management Important Message (Signed)
Important Message  Patient Details  Name: ABBIE JABLON MRN: [id number] Date of Birth: 1935/12/03   Medicare Important Message Given:  Yes     Tommy Medal 07/01/2019, 1:31 PM

## 2019-07-01 NOTE — Progress Notes (Signed)
[facility] Progress Note  3 Days Post-Op  Subjective: Doing fair. Started on clears given no nausea and flatus. Tolerating well.   Objective: Vital signs in last 24 hours: Temp:  [98 F (36.7 C)-98.4 F (36.9 C)] 98.4 F (36.9 C) (06/11 1537) Pulse Rate:  [54-66] 59 (06/11 1537) Resp:  [16-18] 16 (06/11 1537) BP: (123-172)/(46-57) 123/46 (06/11 1537) SpO2:  [97 %-99 %] 97 % (06/11 1946) Last BM Date: 06/30/19  Intake/Output from previous day: 06/10 0701 - 06/11 0700 In: 21.9 [IV Piggyback:21.9] Out: 0017 [Urine:4650] Intake/Output this shift: No intake/output data recorded.  General appearance: alert, cooperative and no distress Resp: normal work of breathing GI: soft, mildly distended, binder in place, honeycomb with no staining, erythema irritation looking more like bruise now, no signs of drainage or cellulitis   Lab Results:  Recent Labs    06/30/19 0837 07/01/19 0601  WBC 9.5 8.1  HGB 9.8* 8.8*  HCT 30.4* 27.4*  PLT 190 199   BMET Recent Labs    06/30/19 0837 07/01/19 0601  NA 140 139  K 3.4* 3.1*  CL 109 105  CO2 23 25  GLUCOSE 97 99  BUN 11 13  CREATININE 1.08 1.37*  CALCIUM 7.8* 7.7*   PT/INR No results for input(s): LABPROT, INR in the last 72 hours.  Studies/Results: DG CHEST PORT 1 VIEW  Result Date: 06/30/2019 CLINICAL DATA:  Cough. EXAM: PORTABLE CHEST 1 VIEW COMPARISON:  June 25, 2019 FINDINGS: The enteric tube extends below the left hemidiaphragm. The heart size is stable. Aortic calcifications are noted. There is no significant pleural effusion. There is a possible developing infiltrate in the medial right lower lung zone. There are old healed left-sided rib fractures. There is no acute displaced fracture. IMPRESSION: Possible new infiltrate in the medial right lower lung zone. Electronically Signed   By: Constance Holster M.D.   On: 06/30/2019 03:47    Anti-infectives: Anti-infectives (From admission, onward)   Start      Dose/Rate Route Frequency Ordered Stop   06/30/19 1400  piperacillin-tazobactam (ZOSYN) IVPB 3.375 g     Discontinue     3.375 g 12.5 mL/hr over 240 Minutes Intravenous Every 8 hours 06/30/19 0422     06/30/19 0430  cefTRIAXone (ROCEPHIN) 2 g in sodium chloride 0.9 % 100 mL IVPB  Status:  Discontinued        2 g 200 mL/hr over 30 Minutes Intravenous Every 24 hours 06/30/19 0415 06/30/19 0416   06/30/19 0430  azithromycin (ZITHROMAX) 500 mg in sodium chloride 0.9 % 250 mL IVPB     Discontinue     500 mg 250 mL/hr over 60 Minutes Intravenous Every 24 hours 06/30/19 0415 07/05/19 0414   06/30/19 0430  piperacillin-tazobactam (ZOSYN) IVPB 3.375 g        3.375 g 100 mL/hr over 30 Minutes Intravenous STAT 06/30/19 0422 06/30/19 0614   06/29/19 0900  ceFAZolin (ANCEF) IVPB 1 g/50 mL premix  Status:  Discontinued        1 g 100 mL/hr over 30 Minutes Intravenous Every 8 hours 06/29/19 0834 06/30/19 0415   06/28/19 2200  ceFAZolin (ANCEF) IVPB 1 g/50 mL premix  Status:  Discontinued        1 g 100 mL/hr over 30 Minutes Intravenous Every 12 hours 06/28/19 1109 06/29/19 0834   06/28/19 0600  ceFAZolin (ANCEF) IVPB 2g/100 mL premix        2 g 200 mL/hr over 30 Minutes Intravenous On call  to O.R. 06/27/19 2037 06/28/19 1309   06/27/19 1445  cefTRIAXone (ROCEPHIN) 1 g in sodium chloride 0.9 % 100 mL IVPB  Status:  Discontinued        1 g 200 mL/hr over 30 Minutes Intravenous Every 24 hours 06/27/19 1434 06/28/19 1107   06/26/19 1845  fluconazole (DIFLUCAN) IVPB 100 mg  Status:  Discontinued       "Followed by" Linked Group Details   100 mg 50 mL/hr over 60 Minutes Intravenous Every 24 hours 06/25/19 1832 06/26/19 1356   06/25/19 1845  fluconazole (DIFLUCAN) IVPB 200 mg       "Followed by" Linked Group Details   200 mg 100 mL/hr over 60 Minutes Intravenous  Once 06/25/19 1832 06/25/19 2030      Assessment/Plan: Mr. Haithcock is a 84 yo POD 3 s/p primary ventral hernia repair with vicryl mesh  onlay and SBR for SBO due to hernia.  PRN for pain IS, OOB Work with PT Clear diet  Incision looking more like bruise now and no signs of infection Will await more flatus versus BM for adv diet    LOS: 6 days    Virl Cagey 07/01/2019

## 2019-07-01 NOTE — Progress Notes (Signed)
PROGRESS NOTE    Derek Foster  ZOX:[id number] DOB: 08/25/1935 DOA: 06/25/2019 PCP: Celene Squibb, MD   Brief Narrative:  Derek Foster a 84 y.o.malewith a history of CAD with history of MI and stenting, A. fib on chronic anticoagulation, hypertension, hyperlipidemia, hypothyroidism, GERD, chronic indwelling urinary catheter, ventral hernia. Patient presents to the hospital with 2 days of generalized lower abdominal pain with coffee-ground emesis with a couple of episodes today. No diarrhea. Reports normal bowel movement yesterday. No hematuria, melena, hematochezia.  Emergency Department Course: CT of the abdomen/pelvis shows small bowel obstructions coming from ventral hernia with loop of bowel in the hernia. General surgery consulted.NG tube placed.  Assessment & Plan:   Principal Problem:   Small bowel obstruction (HCC) Active Problems:   Hyperthyroidism   Essential hypertension   CAD S/P percutaneous coronary angioplasty   Atrial fibrillation, chronic (HCC)   AKI (acute kidney injury) (HCC)   BPH (benign prostatic hyperplasia)   GERD without esophagitis   Anticoagulated   Candidal UTI (urinary tract infection)   Ventral hernia with bowel obstruction   #1 recurrent small bowel obstruction secondary to ventral hernia  -status post small bowel resection and primary repair of the hernia 06/28/2019 by Dr. Constance Haw. -Follow postoperative recommendation -Advance to clear liquid diet once Follow-up in per general surgery.  NG tube removed on 6/10 by general surgery  #2 history of A. fib was on Eliquisprior to admission -resume when able as per surgery rec's. -Continue amiodaroneand metoprolol. -continue telemetry monitoring.  #3 history of CAD status post stent LAD PCI in 2014.  -Ejection fraction 60 to 65%. -continue on metoprolol with dose increased to 50 mg twice daily given hypertension last night, continue to monitor -continue follow up with  cardiology.  #4 AKI -creatinine 1.3 down from 1.7 on admission.  -Holding further IV fluid -Responded well to IV Lasix on 6/10 with greater than 4.5 L urine output noted. -follow Cr trend  #5 history of BPH -No complaints of urinary retention symptoms -Continue Flomax and finasteride.  #6 history of GERD -continue PPI  #7 history of hyper thyroidism -Continue methimazole.  #8 coffee-ground emesis reported by the patientprior to admission -Hemoglobin 10.1 down from 11.2 with no evidence of active bleeding hemodilution could be playing a role. -FOBT is positive however he is not having any active bleeding will have her to follow-up with GI as an outpatient. -continue PPI  #9 Proteus UTI in the setting of chronic Foley catheter -continue now on Zosyn day 6/7 -Foley catheter to be exchanged as it has been 30 days.  #10 hypokalemia -Replete orally today -Plan is to maintain potassium above4and magnesium above 2.  #11 pressure injury: present on admission Pressure Injury 03/12/19 Buttocks Left Stage 2 - Partial thickness loss of dermis presenting as a shallow open injury with a red, pink wound bed without slough. (Active)  03/12/19 0924  Location: Buttocks  Location Orientation: Left  Staging: Stage 2 - Partial thickness loss of dermis presenting as a shallow open injury with a red, pink wound bed without slough.  Wound Description (Comments):   Present on Admission:   #12 right-sided infiltrate with concern for aspiration pneumonia -Started on Zosyn as well as azithromycin empirically -Noted to have significant chest congestion for which Mucomyst has been prescribed as well as flutter valve -Repeat chest x-ray 6/12  Estimated body mass index is 35.82 kg/m as calculated from the following: Height as of this encounter: 6' (1.829 m). Weight as of  this encounter: 119.8 kg.  DVT prophylaxis:SCD Eliquis on hold for surgery; plan to continue  by DC Code Status:Full code Family Communicationplan to call daughter-in-law on phone. Disposition Plan:Remains inpatient. Continue slowly advancing diet and following postoperative recommendations by general surgery. Continue resuming oral medications as tolerated and continue IV fluids/electrolyte repletion. Currently with ileus and no medically ready for discharge.  Dispo: The patient is from:Home Anticipated d/c is to:SNF Anticipated d/c date is:>3 days.  Patient currentlyis not medically stable to d/c.Patient admitted with recurrent bowel obstruction secondary to hernia ventral hernia with NG tube in place and is status post surgeryon6/08/2019. No passing gas or bowel movements yet; concerns for possible ileus.   Consultants: General surgery Cardiology. Care discussed with GI(Dr. Rourk)over the phone:recommended outpatient follow-up after discharge. Continue PPI.  ProceduresNG tube 06/25/2019   Antimicrobials:  Anti-infectives (From admission, onward)   Start     Dose/Rate Route Frequency Ordered Stop   06/30/19 1400  piperacillin-tazobactam (ZOSYN) IVPB 3.375 g     Discontinue     3.375 g 12.5 mL/hr over 240 Minutes Intravenous Every 8 hours 06/30/19 0422     06/30/19 0430  cefTRIAXone (ROCEPHIN) 2 g in sodium chloride 0.9 % 100 mL IVPB  Status:  Discontinued        2 g 200 mL/hr over 30 Minutes Intravenous Every 24 hours 06/30/19 0415 06/30/19 0416   06/30/19 0430  azithromycin (ZITHROMAX) 500 mg in sodium chloride 0.9 % 250 mL IVPB     Discontinue     500 mg 250 mL/hr over 60 Minutes Intravenous Every 24 hours 06/30/19 0415 07/05/19 0414   06/30/19 0430  piperacillin-tazobactam (ZOSYN) IVPB 3.375 g        3.375 g 100 mL/hr over 30 Minutes Intravenous STAT 06/30/19 0422 06/30/19 0614   06/29/19 0900  ceFAZolin (ANCEF) IVPB 1 g/50 mL premix  Status:  Discontinued        1 g 100 mL/hr over 30 Minutes Intravenous  Every 8 hours 06/29/19 0834 06/30/19 0415   06/28/19 2200  ceFAZolin (ANCEF) IVPB 1 g/50 mL premix  Status:  Discontinued        1 g 100 mL/hr over 30 Minutes Intravenous Every 12 hours 06/28/19 1109 06/29/19 0834   06/28/19 0600  ceFAZolin (ANCEF) IVPB 2g/100 mL premix        2 g 200 mL/hr over 30 Minutes Intravenous On call to O.R. 06/27/19 2037 06/28/19 1309   06/27/19 1445  cefTRIAXone (ROCEPHIN) 1 g in sodium chloride 0.9 % 100 mL IVPB  Status:  Discontinued        1 g 200 mL/hr over 30 Minutes Intravenous Every 24 hours 06/27/19 1434 06/28/19 1107   06/26/19 1845  fluconazole (DIFLUCAN) IVPB 100 mg  Status:  Discontinued       "Followed by" Linked Group Details   100 mg 50 mL/hr over 60 Minutes Intravenous Every 24 hours 06/25/19 1832 06/26/19 1356   06/25/19 1845  fluconazole (DIFLUCAN) IVPB 200 mg       "Followed by" Linked Group Details   200 mg 100 mL/hr over 60 Minutes Intravenous  Once 06/25/19 1832 06/25/19 2030       Subjective: Patient seen and evaluated today with no new acute complaints or concerns. No acute concerns or events noted overnight.  He is eager to start a diet and complains of minimal abdominal pain.  He has diuresed quite well after Lasix given yesterday by cardiology.  He denies any further chest congestion.  Objective: Vitals:   06/30/19 1808 06/30/19 1919 06/30/19 2057 07/01/19 0534  BP: (!) 156/53  (!) 172/57 (!) 150/46  Pulse: 70  66 (!) 54  Resp: 20  18 16   Temp: 98.5 F (36.9 C)  98 F (36.7 C) 98.4 F (36.9 C)  TempSrc: Oral  Oral Oral  SpO2: 95% 95% 99% 97%  Weight:      Height:        Intake/Output Summary (Last 24 hours) at 07/01/2019 1124 Last data filed at 07/01/2019 0500 Gross per 24 hour  Intake 21.93 ml  Output 4650 ml  Net -4628.07 ml   Filed Weights   06/25/19 2330 06/29/19 0500  Weight: 119 kg 119.8 kg    Examination:  General exam: Appears calm and comfortable  Respiratory system: Clear to auscultation.  Respiratory effort normal. Cardiovascular system: S1 & S2 heard, RRR. No JVD, murmurs, rubs, gallops or clicks. No pedal edema. Gastrointestinal system: Midline incision clean dry and intact. Central nervous system: Alert and oriented. No focal neurological deficits. Extremities: SCDs present bilaterally. Skin: No rashes, lesions or ulcers Psychiatry: Judgement and insight appear normal. Mood & affect appropriate.     Data Reviewed: I have personally reviewed following labs and imaging studies  CBC: Recent Labs  Lab 06/25/19 1600 06/25/19 1600 06/26/19 0414 06/28/19 0510 06/29/19 0353 06/30/19 0837 07/01/19 0601  WBC 11.8*   < > 10.0 7.2 10.4 9.5 8.1  NEUTROABS 9.2*  --   --   --   --   --   --   HGB 11.2*   < > 10.1* 9.8* 10.4* 9.8* 8.8*  HCT 35.1*   < > 31.9* 30.5* 31.9* 30.4* 27.4*  MCV 87.5   < > 87.6 88.9 87.2 87.4 88.1  PLT 247   < > 221 162 202 190 199   < > = values in this interval not displayed.   Basic Metabolic Panel: Recent Labs  Lab 06/28/19 0510 06/28/19 1808 06/29/19 0353 06/30/19 0348 06/30/19 0837 07/01/19 0601  NA 139 138 139  --  140 139  K 3.0* 3.4* 3.4*  --  3.4* 3.1*  CL 109 105 106  --  109 105  CO2 24 23 24   --  23 25  GLUCOSE 112* 157* 118*  --  97 99  BUN 16 13 13   --  11 13  CREATININE 1.25* 1.27* 1.15  --  1.08 1.37*  CALCIUM 7.9* 8.0* 8.2*  --  7.8* 7.7*  MG  --  1.9 1.8 1.8  --  1.8   GFR: Estimated Creatinine Clearance: 55.6 mL/min (A) (by C-G formula based on SCr of 1.37 mg/dL (H)). Liver Function Tests: Recent Labs  Lab 06/25/19 1600 06/28/19 0510 06/29/19 0353  AST 15 12* 12*  ALT 13 15 13   ALKPHOS 89 76 74  BILITOT 0.9 0.4 0.6  PROT 7.0 5.2* 5.7*  ALBUMIN 4.1 2.8* 2.8*   Recent Labs  Lab 06/25/19 1600  LIPASE 18   No results for input(s): AMMONIA in the last 168 hours. Coagulation Profile: No results for input(s): INR, PROTIME in the last 168 hours. Cardiac Enzymes: No results for input(s): CKTOTAL, CKMB,  CKMBINDEX, TROPONINI in the last 168 hours. BNP (last 3 results) No results for input(s): PROBNP in the last 8760 hours. HbA1C: No results for input(s): HGBA1C in the last 72 hours. CBG: Recent Labs  Lab 06/26/19 1058  GLUCAP 91   Lipid Profile: No results for input(s): CHOL, HDL, LDLCALC, TRIG, CHOLHDL,  LDLDIRECT in the last 72 hours. Thyroid Function Tests: No results for input(s): TSH, T4TOTAL, FREET4, T3FREE, THYROIDAB in the last 72 hours. Anemia Panel: No results for input(s): VITAMINB12, FOLATE, FERRITIN, TIBC, IRON, RETICCTPCT in the last 72 hours. Sepsis Labs: Recent Labs  Lab 06/25/19 1600  LATICACIDVEN 1.2    Recent Results (from the past 240 hour(s))  SARS Coronavirus 2 by RT PCR (hospital order, performed in [facility] hospital lab) Nasopharyngeal Nasopharyngeal Swab     Status: None   Collection Time: 06/25/19  5:32 PM   Specimen: Nasopharyngeal Swab  Result Value Ref Range Status   SARS Coronavirus 2 NEGATIVE NEGATIVE Final    Comment: (NOTE) SARS-CoV-2 target nucleic acids are NOT DETECTED. The SARS-CoV-2 RNA is generally detectable in upper and lower respiratory specimens during the acute phase of infection. The lowest concentration of SARS-CoV-2 viral copies this assay can detect is 250 copies / mL. A negative result does not preclude SARS-CoV-2 infection and should not be used as the sole basis for treatment or other patient management decisions.  A negative result may occur with improper specimen collection / handling, submission of specimen other than nasopharyngeal swab, presence of viral mutation(s) within the areas targeted by this assay, and inadequate number of viral copies (<250 copies / mL). A negative result must be combined with clinical observations, patient history, and epidemiological information. Fact Sheet for Patients:   StrictlyIdeas.no Fact Sheet for Healthcare  Providers: BankingDealers.co.za This test is not yet approved or cleared  by the [place] FDA and has been authorized for detection and/or diagnosis of SARS-CoV-2 by FDA under an Emergency Use Authorization (EUA).  This EUA will remain in effect (meaning this test can be used) for the duration of the COVID-19 declaration under Section 564(b)(1) of the Act, 21 U.S.C. section 360bbb-3(b)(1), unless the authorization is terminated or revoked sooner. Performed at [facility], [address], [place], [place] [address]   Urine culture     Status: Abnormal   Collection Time: 06/25/19  5:59 PM   Specimen: Urine, Catheterized  Result Value Ref Range Status   Specimen Description   Final    URINE, CATHETERIZED Performed at [facility], [address], [place], [place] [address]    Special Requests   Final    NONE Performed at [facility], [address] [place], [place] [address]    Culture >=100,000 COLONIES/mL PROTEUS MIRABILIS (A)  Final   Report Status 06/28/2019 FINAL  Final   Organism ID, Bacteria PROTEUS MIRABILIS (A)  Final      Susceptibility   Proteus mirabilis - MIC*    AMPICILLIN <=2 SENSITIVE Sensitive     CEFAZOLIN <=4 SENSITIVE Sensitive     CEFTRIAXONE <=1 SENSITIVE Sensitive     CIPROFLOXACIN 0.5 SENSITIVE Sensitive     GENTAMICIN <=1 SENSITIVE Sensitive     IMIPENEM 4 SENSITIVE Sensitive     NITROFURANTOIN 256 RESISTANT Resistant     TRIMETH/SULFA <=20 SENSITIVE Sensitive     AMPICILLIN/SULBACTAM <=2 SENSITIVE Sensitive     PIP/TAZO <=4 SENSITIVE Sensitive     * >=100,000 COLONIES/mL PROTEUS MIRABILIS  Surgical pcr screen     Status: None   Collection Time: 06/27/19  8:39 PM   Specimen: Nasal Mucosa; Nasal Swab  Result Value Ref Range Status   MRSA, PCR NEGATIVE NEGATIVE Final   Staphylococcus aureus NEGATIVE NEGATIVE Final    Comment: (NOTE) The Xpert SA Assay (FDA approved for NASAL specimens in patients 87 years of age  and older),  is one component of a comprehensive surveillance program. It is not intended to diagnose infection nor to guide or monitor treatment. Performed at [facility], [address] [place], [place] [address]   MRSA PCR Screening     Status: None   Collection Time: 06/30/19  4:16 AM   Specimen: Nasopharyngeal  Result Value Ref Range Status   MRSA by PCR NEGATIVE NEGATIVE Final    Comment:        The GeneXpert MRSA Assay (FDA approved for NASAL specimens only), is one component of a comprehensive MRSA colonization surveillance program. It is not intended to diagnose MRSA infection nor to guide or monitor treatment for MRSA infections. Performed at [facility], [address], [place], [place] [address]   Culture, sputum-assessment     Status: None   Collection Time: 06/30/19  8:05 AM   Specimen: Sputum  Result Value Ref Range Status   Specimen Description SPUTUM  Final   Special Requests Immunocompromised  Final   Sputum evaluation   Final    Sputum specimen not acceptable for testing.  Please recollect.   NOTIFIED Cristopher Peru @1002  06/30/2019 BY JONES,T Performed at [facility], [address] [place],  [address]    Report Status 06/30/2019 FINAL  Final         Radiology Studies: DG CHEST PORT 1 VIEW  Result Date: 06/30/2019 CLINICAL DATA:  Cough. EXAM: PORTABLE CHEST 1 VIEW COMPARISON:  June 25, 2019 FINDINGS: The enteric tube extends below the left hemidiaphragm. The heart size is stable. Aortic calcifications are noted. There is no significant pleural effusion. There is a possible developing infiltrate in the medial right lower lung zone. There are old healed left-sided rib fractures. There is no acute displaced fracture. IMPRESSION: Possible new infiltrate in the medial right lower lung zone. Electronically Signed   By: Constance Holster M.D.   On: 06/30/2019 03:47        Scheduled Meds: . amiodarone  200 mg Oral Daily  . Chlorhexidine Gluconate Cloth  6 each  Topical Daily  . finasteride  5 mg Oral Daily  . hydrALAZINE  10 mg Oral Q8H  . methimazole  20 mg Oral BID  . metoprolol tartrate  50 mg Oral BID  . nystatin   Topical TID  . ofloxacin  1 drop Both Eyes QID  . pantoprazole (PROTONIX) IV  40 mg Intravenous Q12H  . potassium chloride  40 mEq Oral BID  . pravastatin  10 mg Oral q1800  . tamsulosin  0.4 mg Oral QPC breakfast   Continuous Infusions: . azithromycin 500 mg (07/01/19 0432)  . piperacillin-tazobactam (ZOSYN)  IV 3.375 g (07/01/19 0551)     LOS: 6 days    Time spent: 30 minutes    Key Cen D Manuella Ghazi, DO [facility]  If 7PM-7AM, please contact night-coverage [email or web address] 07/01/2019, 11:24 AM

## 2019-07-01 NOTE — Progress Notes (Signed)
Physical Therapy Treatment Patient Details Name: Derek Foster MRN: [id number] DOB: 22-Feb-1935 Today's Date: 07/01/2019    History of Present Illness Derek Foster is a 84 y.o. male with a history of CAD with history of MI and stenting, A. fib on chronic anticoagulation, hypertension, hyperlipidemia, hypothyroidism, GERD, chronic indwelling urinary catheter, ventral hernia.  Patient presents to the hospital with 2 days of generalized lower abdominal pain with coffee-ground emesis with a couple of episodes today.  No diarrhea.  Reports normal bowel movement yesterday.  No hematuria, melena, hematochezia.    PT Comments    Patient reports he sat in the chair until 7 pm last night, approximately 5 hours. Patient agreeable to participating in PT session today. Patient requiring less assistance for bed mobility, transfers and ambulation with RW but continues to require additional time, HOB maximally elevated for supine to sit, and transfer surface elevated to 23 inches with mod assist to stand. Patient unable to come to a fully erect posture due to abdominal discomfort. All these factors continue to increase his fall risk and increase his risk for injury. Recliner seat transfer surface is 17 inches.    Follow Up Recommendations  SNF;Home health PT;Supervision for mobility/OOB;Supervision - Intermittent     Equipment Recommendations  Other (comment) (Bariatric walker with 5" wheels and a bariatric BSC)    Recommendations for Other Services       Precautions / Restrictions Precautions Precautions: Fall Restrictions Weight Bearing Restrictions: No    Mobility  Bed Mobility Overal bed mobility: Needs Assistance Bed Mobility: Supine to Sit     Supine to sit: HOB elevated;Supervision     General bed mobility comments: HOB maximally elevated; increase time; verbal cues; slow to move; heavy bedrail use  Transfers Overall transfer level: Needs assistance Equipment used: Rolling walker (2  wheeled) Transfers: Sit to/from [facility] Sit to Stand: Mod assist;From elevated surface (23 inches; stand to sit to approximately 17 inches) Stand pivot transfers: Min guard       General transfer comment: slow to move, increased time, difficulty coming to a fully erect position due to abdominal discomfort  Ambulation/Gait Ambulation/Gait assistance: Min guard Gait Distance (Feet): 35 Feet Assistive device: Rolling walker (2 wheeled) Gait Pattern/deviations: Step-through pattern;Decreased step length - right;Decreased step length - left;Decreased stride length;Trunk flexed;Wide base of support Gait velocity: decreased   General Gait Details: slow, labored gait with RW; trunk/spinal flexion due to abdominal discomfort; limited by fatigue.   Stairs     Wheelchair Mobility    Modified Rankin (Stroke Patients Only)       Balance Overall balance assessment: Needs assistance Sitting-balance support: Bilateral upper extremity supported;Feet supported Sitting balance-Leahy Scale: Good     Standing balance support: Bilateral upper extremity supported;During functional activity Standing balance-Leahy Scale: Fair Standing balance comment: fair with RW       Cognition Arousal/Alertness: Awake/alert Behavior During Therapy: WFL for tasks assessed/performed Overall Cognitive Status: Within Functional Limits for tasks assessed         Exercises General Exercises - Upper Extremity Shoulder Flexion: AROM;Strengthening;Both;10 reps;Seated General Exercises - Lower Extremity Quad Sets: Strengthening;Both;10 reps;Seated Gluteal Sets: Strengthening;Both;10 reps;Seated Long Arc Quad: AROM;Strengthening;Both;10 reps Hip ABduction/ADduction: Strengthening;Both;10 reps;Seated Hip Flexion/Marching: Strengthening;Both;10 reps;Seated Toe Raises: AROM;Strengthening;Both;10 reps;Seated Heel Raises: AROM;Strengthening;Both;10 reps;Seated    General Comments         Pertinent Vitals/Pain Pain Assessment: 0-10 Pain Score: 2  Pain Location: abdomen Pain Intervention(s): Limited activity within patient's tolerance;Monitored during session  Home Living         Prior Function     PT Goals (current goals can now be found in the care plan section) Acute Rehab PT Goals Patient Stated Goal: Go home with [facility] services again; not to rehab again. PT Goal Formulation: With patient Time For Goal Achievement: 07/14/19 Potential to Achieve Goals: Fair Progress towards PT goals: Progressing toward goals    Frequency    Min 3X/week      PT Plan Current plan remains appropriate    Co-evaluation      AM-PAC PT "6 Clicks" Mobility   Outcome Measure  Help needed turning from your back to your side while in a flat bed without using bedrails?: A Lot Help needed moving from lying on your back to sitting on the side of a flat bed without using bedrails?: A Lot Help needed moving to and from a bed to a chair (including a wheelchair)?: A Little Help needed standing up from a chair using your arms (e.g., wheelchair or bedside chair)?: A Lot Help needed to walk in hospital room?: A Little Help needed climbing 3-5 steps with a railing? : A Lot 6 Click Score: 14    End of Session Equipment Utilized During Treatment: Gait belt Activity Tolerance: Patient tolerated treatment well;Patient limited by fatigue Patient left: in chair;with call bell/phone within reach Nurse Communication: Mobility status PT Visit Diagnosis: Unsteadiness on feet (R26.81);History of falling (Z91.81);Other abnormalities of gait and mobility (R26.89);Muscle weakness (generalized) (M62.81)     Time: 8325-4982 PT Time Calculation (min) (ACUTE ONLY): 30 min  Charges:  $Gait Training: 8-22 mins $Therapeutic Exercise: 8-22 mins                     Floria Raveling. Hartnett-Rands, MS, PT Per [address][id number] 07/01/2019, 12:25 PM

## 2019-07-01 NOTE — Progress Notes (Signed)
Progress Note  Patient Name: GREYSON PEAVY Date of Encounter: 07/01/2019  Primary Cardiologist: Skeet Latch, MD  Subjective   Less congested NG tube out   Inpatient Medications    Scheduled Meds: . amiodarone  200 mg Oral Daily  . Chlorhexidine Gluconate Cloth  6 each Topical Daily  . finasteride  5 mg Oral Daily  . hydrALAZINE  10 mg Oral Q8H  . methimazole  20 mg Oral BID  . metoprolol tartrate  50 mg Oral BID  . nystatin   Topical TID  . ofloxacin  1 drop Both Eyes QID  . pantoprazole (PROTONIX) IV  40 mg Intravenous Q12H  . pravastatin  10 mg Oral q1800  . tamsulosin  0.4 mg Oral QPC breakfast   Continuous Infusions: . azithromycin 500 mg (07/01/19 0432)  . piperacillin-tazobactam (ZOSYN)  IV 3.375 g (07/01/19 0551)   PRN Meds: morphine injection, ondansetron **OR** ondansetron (ZOFRAN) IV   Vital Signs    Vitals:   06/30/19 1808 06/30/19 1919 06/30/19 2057 07/01/19 0534  BP: (!) 156/53  (!) 172/57 (!) 150/46  Pulse: 70  66 (!) 54  Resp: 20  18 16   Temp: 98.5 F (36.9 C)  98 F (36.7 C) 98.4 F (36.9 C)  TempSrc: Oral  Oral Oral  SpO2: 95% 95% 99% 97%  Weight:      Height:        Intake/Output Summary (Last 24 hours) at 07/01/2019 0746 Last data filed at 07/01/2019 0500 Gross per 24 hour  Intake 21.93 ml  Output 4650 ml  Net -4628.07 ml   Filed Weights   06/25/19 2330 06/29/19 0500  Weight: 119 kg 119.8 kg    Telemetry    Sinus rhythm.  Personally reviewed.  ECG    NSR nonspecific ST changes   Physical Exam   Elderly white male Post ventral hernia repair NG tube now out  BS positive Lungs clear anteriorly  Edema in hands  S1/S2 no murmur   Labs    Chemistry Recent Labs  Lab 06/25/19 1600 06/26/19 0414 06/28/19 0510 06/28/19 0510 06/28/19 1808 06/29/19 0353 06/30/19 0837  NA 140   < > 139   < > 138 139 140  K 3.8   < > 3.0*   < > 3.4* 3.4* 3.4*  CL 99   < > 109   < > 105 106 109  CO2 29   < > 24   < > 23 24 23     GLUCOSE 120*   < > 112*   < > 157* 118* 97  BUN 28*   < > 16   < > 13 13 11   CREATININE 1.72*   < > 1.25*   < > 1.27* 1.15 1.08  CALCIUM 9.0   < > 7.9*   < > 8.0* 8.2* 7.8*  PROT 7.0  --  5.2*  --   --  5.7*  --   ALBUMIN 4.1  --  2.8*  --   --  2.8*  --   AST 15  --  12*  --   --  12*  --   ALT 13  --  15  --   --  13  --   ALKPHOS 89  --  76  --   --  74  --   BILITOT 0.9  --  0.4  --   --  0.6  --   GFRNONAA 36*   < > 53*   < >  52* 59* >60  GFRAA 42*   < > >60   < > >60 >60 >60  ANIONGAP 12   < > 6   < > 10 9 8    < > = values in this interval not displayed.     Hematology Recent Labs  Lab 06/29/19 0353 06/30/19 0837 07/01/19 0601  WBC 10.4 9.5 8.1  RBC 3.66* 3.48* 3.11*  HGB 10.4* 9.8* 8.8*  HCT 31.9* 30.4* 27.4*  MCV 87.2 87.4 88.1  MCH 28.4 28.2 28.3  MCHC 32.6 32.2 32.1  RDW 14.8 15.2 15.5  PLT 202 190 199    Radiology    DG CHEST PORT 1 VIEW  Result Date: 06/30/2019 CLINICAL DATA:  Cough. EXAM: PORTABLE CHEST 1 VIEW COMPARISON:  June 25, 2019 FINDINGS: The enteric tube extends below the left hemidiaphragm. The heart size is stable. Aortic calcifications are noted. There is no significant pleural effusion. There is a possible developing infiltrate in the medial right lower lung zone. There are old healed left-sided rib fractures. There is no acute displaced fracture. IMPRESSION: Possible new infiltrate in the medial right lower lung zone. Electronically Signed   By: Constance Holster M.D.   On: 06/30/2019 03:47    Cardiac Studies   Echocardiogram 02/13/2019: 1. Left ventricular ejection fraction, by visual estimation, is 60 to  65%. The left ventricle has normal function. There is moderately increased  left ventricular hypertrophy.  2. Definity contrast agent was given IV to delineate the left ventricular  endocardial borders.  3. Left ventricular diastolic parameters are indeterminate.  4. The left ventricle has no regional wall motion abnormalities.  5.  Global right ventricle has normal systolic function.The right  ventricular size is normal. Right vetricular wall thickness was not  assessed.  6. Left atrial size was severely dilated.  7. Right atrial size was normal.  8. The mitral valve is grossly normal. No evidence of mitral valve  regurgitation.  9. The tricuspid valve is grossly normal.  10. The tricuspid valve is grossly normal. Tricuspid valve regurgitation  is mild.  11. The aortic valve is tricuspid. Aortic valve regurgitation is not  visualized. No evidence of aortic valve sclerosis or stenosis.  12. The pulmonic valve was grossly normal. Pulmonic valve regurgitation is  not visualized.  13. Mildly elevated pulmonary artery systolic pressure.  14. The interatrial septum was not well visualized.   Patient Profile     84 y.o. male with a history of CAD, hx MI PCI to LAD 2011, HTN, HLD, PAF with COVID-19 and Small bowel obstruction 01/2019.  Now followed perioperatively in the setting of ventral hernia repair.  Assessment & Plan    1.  CAD status post DES to the LAD in 2011, no active angina at this time. LVEF 60 to 65% by echocardiogram in January of this year.  On Lopressor and Pravachol as an outpatient, not on aspirin given use of Eliquis.  2.  Paroxysmal atrial fibrillation with CHA2DS2-VASc score of 4.  He remains in sinus rhythm, Eliquis has been on hold with recent surgery resume prior to d/c Continue lopressor   3.  Postop day #3status post ventral hernia here with lysis of adhesions under general anesthesia. Taking PO   4. Pneumonia:  CXR with infiltrate on zosyn  Had good diuresis with lasix yesterday 4.6 L's will not dose with lasix today f/u CXR over weekend   Signed, Jenkins Rouge, MD  07/01/2019, 7:46 AM

## 2019-07-01 NOTE — Progress Notes (Signed)
Started clear liquids with good intake and no increased pain or nausea.

## 2019-07-02 ENCOUNTER — Inpatient Hospital Stay (HOSPITAL_COMMUNITY): Payer: PPO

## 2019-07-02 LAB — CBC
HCT: 28.1 % — ABNORMAL LOW (ref 39.0–52.0)
Hemoglobin: 8.9 g/dL — ABNORMAL LOW (ref 13.0–17.0)
MCH: 27.9 pg (ref 26.0–34.0)
MCHC: 31.7 g/dL (ref 30.0–36.0)
MCV: 88.1 fL (ref 80.0–100.0)
Platelets: 197 10*3/uL (ref 150–400)
RBC: 3.19 MIL/uL — ABNORMAL LOW (ref 4.22–5.81)
RDW: 15.5 % (ref 11.5–15.5)
WBC: 7.4 10*3/uL (ref 4.0–10.5)
nRBC: 0 % (ref 0.0–0.2)

## 2019-07-02 LAB — BASIC METABOLIC PANEL
Anion gap: 10 (ref 5–15)
BUN: 13 mg/dL (ref 8–23)
CO2: 24 mmol/L (ref 22–32)
Calcium: 8 mg/dL — ABNORMAL LOW (ref 8.9–10.3)
Chloride: 106 mmol/L (ref 98–111)
Creatinine, Ser: 1.22 mg/dL (ref 0.61–1.24)
GFR calc Af Amer: >60 mL/min (ref >60)
GFR calc non Af Amer: 55 mL/min — ABNORMAL LOW (ref >60)
Glucose, Bld: 114 mg/dL — ABNORMAL HIGH (ref 70–99)
Potassium: 3.4 mmol/L — ABNORMAL LOW (ref 3.5–5.1)
Sodium: 140 mmol/L (ref 135–145)

## 2019-07-02 LAB — MAGNESIUM: Magnesium: 1.8 mg/dL (ref 1.7–2.4)

## 2019-07-02 MED ORDER — APIXABAN 5 MG PO TABS
5.0000 mg | ORAL_TABLET | Freq: Two times a day (BID) | ORAL | Status: DC
  Administered 2019-07-02 – 2019-07-04 (×4): 5 mg via ORAL
  Filled 2019-07-02 (×4): qty 1

## 2019-07-02 NOTE — Progress Notes (Signed)
PROGRESS NOTE    Derek Foster  BHA:[id number] DOB: [birth year] DOA: 06/25/2019 PCP: Celene Squibb, MD   Brief Narrative:  Derek Foster [name] a 84 y.o.malewith a history of CAD with history of MI and stenting, A. fib on chronic anticoagulation, hypertension, hyperlipidemia, hypothyroidism, GERD, chronic indwelling urinary catheter, ventral hernia. Patient presents to the hospital with 2 days of generalized lower abdominal pain with coffee-ground emesis with a couple of episodes today. No diarrhea. Reports normal bowel movement yesterday. No hematuria, melena, hematochezia.  Emergency Department Course: CT of the abdomen/pelvis shows small bowel obstructions coming from ventral hernia with loop of bowel in the hernia. General surgery consulted.NG tube placed.   Assessment & Plan:   Principal Problem:   Small bowel obstruction (HCC) Active Problems:   Hyperthyroidism   Essential hypertension   CAD S/P percutaneous coronary angioplasty   Atrial fibrillation, chronic (HCC)   AKI (acute kidney injury) (HCC)   BPH (benign prostatic hyperplasia)   GERD without esophagitis   Anticoagulated   Candidal UTI (urinary tract infection)   Ventral hernia with bowel obstruction   #1 recurrent small bowel obstruction secondary to ventral hernia  -status post small bowel resection and primary repair of the hernia 06/28/2019 by Dr. Constance Haw. -Follow postoperative recommendation -Advance to full liquid and then further as tolerated Follow-up in per general surgery. NG tube removed on 6/10 by general surgery  #2 history of A. fib was on Eliquisprior to admission -Okay to resume Eliquis 6/2 per general surgery -Continue amiodaroneand metoprolol. -continue telemetry monitoring.  #3 history of CAD status post stent LAD PCI in 2014.  -Ejection fraction 60 to 65%. -continueon metoprolol with dose increased to 50 mg twice daily given hypertension last night, continue to monitor -continue  follow up with cardiology.  #4 AKI -creatinine 1.3 down from 1.7 on admission.  -Holding further IV fluid -Responded well to IV Lasix on 6/10 with greater than 4.5 L urine output noted. -follow Cr trend  #5 history of BPH -No complaints of urinary retention symptoms -Continue Flomax and finasteride.  #6 history of GERD -continue PPI  #7 history of hyper thyroidism -Continue methimazole.  #8 coffee-ground emesis reported by the patientprior to admission -Hemoglobin 8.9 down from 11.2 with no evidence of active bleeding hemodilution could be playing a role. -FOBT is positive however he is not having any active bleeding will have her to follow-up with GI as an outpatient. -continue PPI  #9 Proteus UTI in the setting of chronic Foley catheter -continuenow on Zosyn day 6/7 -Foley catheter to be exchanged as it has been 30 days.  #10 hypokalemia -Replete orally today -Plan is to maintain potassium above4and magnesium above 2.  #11 pressure injury: present on admission Pressure Injury 03/12/19 Buttocks Left Stage 2 - Partial thickness loss of dermis presenting as a shallow open injury with a red, pink wound bed without slough. (Active)  03/12/19 0924  Location: Buttocks  Location Orientation: Left  Staging: Stage 2 - Partial thickness loss of dermis presenting as a shallow open injury with a red, pink wound bed without slough.  Wound Description (Comments):   Present on Admission:   #12right-sided infiltrate with concern for aspiration pneumonia -Started on Zosyn as well as azithromycin empirically -Noted to have significant chest congestion for which Mucomyst has been prescribed as well as flutter valve -Repeat chest x-ray with similar findings as prior -Continue on Mucinex for congestion -Plan to discharge on oral Augmentin once ready in next 1-2 days  Estimated  body mass index is 35.82 kg/m as calculated from the following: Height as of  this encounter: 6' (1.829 m). Weight as of this encounter: 119.8 kg.  DVT prophylaxis:Eliquis resumed Code Status:Full code Family Communicationplan to call daughter-in-law on phone. Disposition Plan:Remains inpatient. Continue slowly advancing diet and following postoperative recommendations by general surgery. Continue resuming oral medications as tolerated and continue IV fluids/electrolyte repletion. Currently with ileus and no medically ready for discharge.  Dispo: The patient is from:Home Anticipated d/c is to:SNF Anticipated d/c date is:2 days  Patient currentlyis not medically stable to d/c.Advancing diet per general surgery.  Will discharge to SNF once stable hopefully in 2 days.   Consultants: General surgery Cardiology. Care discussed with GI(Dr. Rourk)over the phone:recommended outpatient follow-up after discharge. Continue PPI.  ProceduresNG tube 06/25/2019    Antimicrobials:  Anti-infectives (From admission, onward)   Start     Dose/Rate Route Frequency Ordered Stop   06/30/19 1400  piperacillin-tazobactam (ZOSYN) IVPB 3.375 g     Discontinue     3.375 g 12.5 mL/hr over 240 Minutes Intravenous Every 8 hours 06/30/19 0422     06/30/19 0430  cefTRIAXone (ROCEPHIN) 2 g in sodium chloride 0.9 % 100 mL IVPB  Status:  Discontinued        2 g 200 mL/hr over 30 Minutes Intravenous Every 24 hours 06/30/19 0415 06/30/19 0416   06/30/19 0430  azithromycin (ZITHROMAX) 500 mg in sodium chloride 0.9 % 250 mL IVPB     Discontinue     500 mg 250 mL/hr over 60 Minutes Intravenous Every 24 hours 06/30/19 0415 07/05/19 0414   06/30/19 0430  piperacillin-tazobactam (ZOSYN) IVPB 3.375 g        3.375 g 100 mL/hr over 30 Minutes Intravenous STAT 06/30/19 0422 06/30/19 0614   06/29/19 0900  ceFAZolin (ANCEF) IVPB 1 g/50 mL premix  Status:  Discontinued        1 g 100 mL/hr over 30 Minutes Intravenous Every 8 hours 06/29/19  0834 06/30/19 0415   06/28/19 2200  ceFAZolin (ANCEF) IVPB 1 g/50 mL premix  Status:  Discontinued        1 g 100 mL/hr over 30 Minutes Intravenous Every 12 hours 06/28/19 1109 06/29/19 0834   06/28/19 0600  ceFAZolin (ANCEF) IVPB 2g/100 mL premix        2 g 200 mL/hr over 30 Minutes Intravenous On call to O.R. 06/27/19 2037 06/28/19 1309   06/27/19 1445  cefTRIAXone (ROCEPHIN) 1 g in sodium chloride 0.9 % 100 mL IVPB  Status:  Discontinued        1 g 200 mL/hr over 30 Minutes Intravenous Every 24 hours 06/27/19 1434 06/28/19 1107   06/26/19 1845  fluconazole (DIFLUCAN) IVPB 100 mg  Status:  Discontinued       "Followed by" Linked Group Details   100 mg 50 mL/hr over 60 Minutes Intravenous Every 24 hours 06/25/19 1832 06/26/19 1356   06/25/19 1845  fluconazole (DIFLUCAN) IVPB 200 mg       "Followed by" Linked Group Details   200 mg 100 mL/hr over 60 Minutes Intravenous  Once 06/25/19 1832 06/25/19 2030       Subjective: Patient seen and evaluated today with no new acute complaints or concerns. No acute concerns or events noted overnight.  Tolerating clear liquid diet.  Passing flatus.  Objective: Vitals:   07/02/19 0533 07/02/19 0546 07/02/19 0549 07/02/19 1354  BP:  (!) 151/50 (!) 151/50 140/60  Pulse: (!) 55  61  Resp:   16 18  Temp:   98.4 F (36.9 C) 98 F (36.7 C)  TempSrc:   Oral Oral  SpO2:   97% 99%  Weight:      Height:        Intake/Output Summary (Last 24 hours) at 07/02/2019 1525 Last data filed at 07/02/2019 0900 Gross per 24 hour  Intake 1059.6 ml  Output --  Net 1059.6 ml   Filed Weights   06/25/19 2330 06/29/19 0500  Weight: 119 kg 119.8 kg    Examination:  General exam: Appears calm and comfortable  Respiratory system: Clear to auscultation. Respiratory effort normal. Cardiovascular system: S1 & S2 heard, RRR. No JVD, murmurs, rubs, gallops or clicks. No pedal edema. Gastrointestinal system: Abdominal incision is clean dry and intact. Central  nervous system: Alert and oriented. No focal neurological deficits. Extremities: Symmetric 5 x 5 power. Skin: No rashes, lesions or ulcers Psychiatry: Judgement and insight appear normal. Mood & affect appropriate.     Data Reviewed: I have personally reviewed following labs and imaging studies  CBC: Recent Labs  Lab 06/25/19 1600 06/26/19 0414 06/28/19 0510 06/29/19 0353 06/30/19 0837 07/01/19 0601 07/02/19 0603  WBC 11.8*   < > 7.2 10.4 9.5 8.1 7.4  NEUTROABS 9.2*  --   --   --   --   --   --   HGB 11.2*   < > 9.8* 10.4* 9.8* 8.8* 8.9*  HCT 35.1*   < > 30.5* 31.9* 30.4* 27.4* 28.1*  MCV 87.5   < > 88.9 87.2 87.4 88.1 88.1  PLT 247   < > 162 202 190 199 197   < > = values in this interval not displayed.   Basic Metabolic Panel: Recent Labs  Lab 06/28/19 1808 06/29/19 0353 06/30/19 0348 06/30/19 0837 07/01/19 0601 07/02/19 0603  NA 138 139  --  140 139 140  K 3.4* 3.4*  --  3.4* 3.1* 3.4*  CL 105 106  --  109 105 106  CO2 23 24  --  23 25 24   GLUCOSE 157* 118*  --  97 99 114*  BUN 13 13  --  11 13 13   CREATININE 1.27* 1.15  --  1.08 1.37* 1.22  CALCIUM 8.0* 8.2*  --  7.8* 7.7* 8.0*  MG 1.9 1.8 1.8  --  1.8 1.8   GFR: Estimated Creatinine Clearance: 62.4 mL/min (by C-G formula based on SCr of 1.22 mg/dL). Liver Function Tests: Recent Labs  Lab 06/25/19 1600 06/28/19 0510 06/29/19 0353  AST 15 12* 12*  ALT 13 15 13   ALKPHOS 89 76 74  BILITOT 0.9 0.4 0.6  PROT 7.0 5.2* 5.7*  ALBUMIN 4.1 2.8* 2.8*   Recent Labs  Lab 06/25/19 1600  LIPASE 18   No results for input(s): AMMONIA in the last 168 hours. Coagulation Profile: No results for input(s): INR, PROTIME in the last 168 hours. Cardiac Enzymes: No results for input(s): CKTOTAL, CKMB, CKMBINDEX, TROPONINI in the last 168 hours. BNP (last 3 results) No results for input(s): PROBNP in the last 8760 hours. HbA1C: No results for input(s): HGBA1C in the last 72 hours. CBG: Recent Labs  Lab  06/26/19 1058  GLUCAP 91   Lipid Profile: No results for input(s): CHOL, HDL, LDLCALC, TRIG, CHOLHDL, LDLDIRECT in the last 72 hours. Thyroid Function Tests: No results for input(s): TSH, T4TOTAL, FREET4, T3FREE, THYROIDAB in the last 72 hours. Anemia Panel: No results for input(s): VITAMINB12, FOLATE, FERRITIN, TIBC, IRON,  RETICCTPCT in the last 72 hours. Sepsis Labs: Recent Labs  Lab 06/25/19 1600  LATICACIDVEN 1.2    Recent Results (from the past 240 hour(s))  SARS Coronavirus 2 by RT PCR (hospital order, performed in [facility] hospital lab) Nasopharyngeal Nasopharyngeal Swab     Status: None   Collection Time: 06/25/19  5:32 PM   Specimen: Nasopharyngeal Swab  Result Value Ref Range Status   SARS Coronavirus 2 NEGATIVE NEGATIVE Final    Comment: (NOTE) SARS-CoV-2 target nucleic acids are NOT DETECTED. The SARS-CoV-2 RNA is generally detectable in upper and lower respiratory specimens during the acute phase of infection. The lowest concentration of SARS-CoV-2 viral copies this assay can detect is 250 copies / mL. A negative result does not preclude SARS-CoV-2 infection and should not be used as the sole basis for treatment or other patient management decisions.  A negative result may occur with improper specimen collection / handling, submission of specimen other than nasopharyngeal swab, presence of viral mutation(s) within the areas targeted by this assay, and inadequate number of viral copies (<250 copies / mL). A negative result must be combined with clinical observations, patient history, and epidemiological information. Fact Sheet for Patients:   StrictlyIdeas.no Fact Sheet for Healthcare Providers: BankingDealers.co.za This test is not yet approved or cleared  by the [place] FDA and has been authorized for detection and/or diagnosis of SARS-CoV-2 by FDA under an Emergency Use Authorization (EUA).  This EUA will  remain in effect (meaning this test can be used) for the duration of the COVID-19 declaration under Section 564(b)(1) of the Act, 21 U.S.C. section 360bbb-3(b)(1), unless the authorization is terminated or revoked sooner. Performed at [facility], [address], [place], [place] [address]   Urine culture     Status: Abnormal   Collection Time: 06/25/19  5:59 PM   Specimen: Urine, Catheterized  Result Value Ref Range Status   Specimen Description   Final    URINE, CATHETERIZED Performed at [facility], [address] [place], [place] [address]    Special Requests   Final    NONE Performed at [facility], [address] [place], [place] [address]    Culture >=100,000 COLONIES/mL PROTEUS MIRABILIS (A)  Final   Report Status 06/28/2019 FINAL  Final   Organism ID, Bacteria PROTEUS MIRABILIS (A)  Final      Susceptibility   Proteus mirabilis - MIC*    AMPICILLIN <=2 SENSITIVE Sensitive     CEFAZOLIN <=4 SENSITIVE Sensitive     CEFTRIAXONE <=1 SENSITIVE Sensitive     CIPROFLOXACIN 0.5 SENSITIVE Sensitive     GENTAMICIN <=1 SENSITIVE Sensitive     IMIPENEM 4 SENSITIVE Sensitive     NITROFURANTOIN 256 RESISTANT Resistant     TRIMETH/SULFA <=20 SENSITIVE Sensitive     AMPICILLIN/SULBACTAM <=2 SENSITIVE Sensitive     PIP/TAZO <=4 SENSITIVE Sensitive     * >=100,000 COLONIES/mL PROTEUS MIRABILIS  Surgical pcr screen     Status: None   Collection Time: 06/27/19  8:39 PM   Specimen: Nasal Mucosa; Nasal Swab  Result Value Ref Range Status   MRSA, PCR NEGATIVE NEGATIVE Final   Staphylococcus aureus NEGATIVE NEGATIVE Final    Comment: (NOTE) The Xpert SA Assay (FDA approved for NASAL specimens in patients 7 years of age and older), is one component of a comprehensive surveillance program. It is not intended to diagnose infection nor to guide or monitor treatment. Performed at [facility], [address], [place], [place] [address]  MRSA PCR Screening     Status: None   Collection  Time: 06/30/19  4:16 AM   Specimen: Nasopharyngeal  Result Value Ref Range Status   MRSA by PCR NEGATIVE NEGATIVE Final    Comment:        The GeneXpert MRSA Assay (FDA approved for NASAL specimens only), is one component of a comprehensive MRSA colonization surveillance program. It is not intended to diagnose MRSA infection nor to guide or monitor treatment for MRSA infections. Performed at [facility], [address] [place], [place] [address]   Culture, sputum-assessment     Status: None   Collection Time: 06/30/19  8:05 AM   Specimen: Sputum  Result Value Ref Range Status   Specimen Description SPUTUM  Final   Special Requests Immunocompromised  Final   Sputum evaluation   Final    Sputum specimen not acceptable for testing.  Please recollect.   NOTIFIED Hassell Done, D @1002  06/30/2019 BY JONES,T Performed at [facility], [address] [place], [place] [address]    Report Status 06/30/2019 FINAL  Final         Radiology Studies: DG Chest 1 View  Result Date: 07/02/2019 CLINICAL DATA:  Chest congestion. EXAM: CHEST  1 VIEW COMPARISON:  06/30/2019; 06/25/2019 FINDINGS: Grossly unchanged borderline enlarged cardiac silhouette and mediastinal contours with atherosclerotic plaque within the aortic arch. Mild pulmonary venous congestion without frank evidence of edema. Previously questioned right lower lung nodular opacities are less conspicuous on the present examination. No discrete focal airspace opacities. No pleural effusion or pneumothorax. No acute osseous abnormalities. IMPRESSION: 1. Similar findings of borderline cardiomegaly and pulmonary venous congestion without definitive superimposed acute cardiopulmonary disease. 2. Questioned right lower lung nodular opacities are less conspicuous on present examination. Electronically Signed   By: Sandi Mariscal M.D.   On: 07/02/2019 06:11        Scheduled Meds: . amiodarone  200 mg Oral Daily  . apixaban  5 mg Oral BID  .  Chlorhexidine Gluconate Cloth  6 each Topical Daily  . finasteride  5 mg Oral Daily  . guaiFENesin  600 mg Oral BID  . hydrALAZINE  10 mg Oral Q8H  . methimazole  20 mg Oral BID  . metoprolol tartrate  50 mg Oral BID  . nystatin   Topical TID  . ofloxacin  1 drop Both Eyes QID  . pantoprazole (PROTONIX) IV  40 mg Intravenous Q12H  . pravastatin  10 mg Oral q1800  . tamsulosin  0.4 mg Oral QPC breakfast   Continuous Infusions: . azithromycin 250 mL/hr at 07/02/19 0446  . piperacillin-tazobactam (ZOSYN)  IV 3.375 g (07/02/19 1356)     LOS: 7 days    Time spent: 30 minutes    Daleyssa Loiselle Darleen Crocker, DO [facility]  If 7PM-7AM, please contact night-coverage [email or web address] 07/02/2019, 3:25 PM

## 2019-07-02 NOTE — Progress Notes (Signed)
[facility]  Had BM this AM. Tolerating clears. No nausea. Incision looking better and looks like bruising. Honeycomb remains in place. Binder.  Full liquids and adv as tolerated to soft/ diabetic etc   Hopefully home from surgery standpoint in the next day or so pending other medical issues/ PNA etc.  Curlene Labrum, MD [facility] [place], [place] [address] [phone] (office)

## 2019-07-03 LAB — BASIC METABOLIC PANEL
Anion gap: 6 (ref 5–15)
BUN: 11 mg/dL (ref 8–23)
CO2: 25 mmol/L (ref 22–32)
Calcium: 7.9 mg/dL — ABNORMAL LOW (ref 8.9–10.3)
Chloride: 107 mmol/L (ref 98–111)
Creatinine, Ser: 1.15 mg/dL (ref 0.61–1.24)
GFR calc Af Amer: >60 mL/min (ref >60)
GFR calc non Af Amer: 59 mL/min — ABNORMAL LOW (ref >60)
Glucose, Bld: 102 mg/dL — ABNORMAL HIGH (ref 70–99)
Potassium: 3 mmol/L — ABNORMAL LOW (ref 3.5–5.1)
Sodium: 138 mmol/L (ref 135–145)

## 2019-07-03 LAB — MAGNESIUM: Magnesium: 1.8 mg/dL (ref 1.7–2.4)

## 2019-07-03 MED ORDER — AMOXICILLIN-POT CLAVULANATE 875-125 MG PO TABS
1.0000 | ORAL_TABLET | Freq: Two times a day (BID) | ORAL | Status: DC
  Administered 2019-07-03 – 2019-07-04 (×3): 1 via ORAL
  Filled 2019-07-03 (×3): qty 1

## 2019-07-03 MED ORDER — DOCUSATE SODIUM 100 MG PO CAPS
100.0000 mg | ORAL_CAPSULE | Freq: Two times a day (BID) | ORAL | Status: DC
  Filled 2019-07-03: qty 1

## 2019-07-03 MED ORDER — OXYCODONE HCL 5 MG PO TABS: 5.0000 mg | ORAL_TABLET | ORAL | Status: DC | PRN

## 2019-07-03 MED ORDER — POTASSIUM CHLORIDE CRYS ER 20 MEQ PO TBCR
40.0000 meq | EXTENDED_RELEASE_TABLET | Freq: Three times a day (TID) | ORAL | Status: AC
  Administered 2019-07-03 (×3): 40 meq via ORAL
  Filled 2019-07-03 (×3): qty 2

## 2019-07-03 NOTE — Discharge Instructions (Signed)
Discharge Surgery Instructions:  Common Complaints: Pain at the incision site is common. This will improve with time. Take your pain medications as described below. Some nausea is common and poor appetite. The main goal is to stay hydrated the first few days after surgery.   Diet/ Activity: Diet as tolerated. You have started and tolerated a diet in the hospital, and should continue to increase what you are able to eat.   You may not have a large appetite, but it is important to stay hydrated. Drink 64 ounces of water a day. Your appetite will return with time.  Keep a dry dressing in place over your staples daily or as needed. Some minor pink/ blood tinged drainage is expected. This will stop in a few days after surgery.  Shower per your regular routine daily.  Do not take hot showers. Take warm showers that are less than 10 minutes. Pat the incision dry. Wear an abdominal binder daily with activity. You do not have to wear this while sleeping or sitting.  Rest and listen to your body, but do not remain in bed all day.  Walk everyday for at least 15-20 minutes. Deep cough and move around every 1-2 hours in the first few days after surgery.  Do not lift > 10 lbs, perform excessive bending, pushing, pulling, squatting for 6-8 weeks after surgery.  The activity restrictions and the abdominal binder are to prevent hernia formation at your incision while you are healing.  Do not place lotions or balms on your incision unless instructed to specifically by Dr. Constance Haw.   Pain Expectations and Narcotics: -After surgery you will have pain associated with your incisions and this is normal. The pain is muscular and nerve pain, and will get better with time. -You are encouraged and expected to take non narcotic medications like tylenol and ibuprofen (when able) to treat pain as multiple modalities can aid with pain treatment. -Narcotics are only used when pain is severe or there is breakthrough pain. -You  are not expected to have a pain score of 0 after surgery, as we cannot prevent pain. A pain score of 3-4 that allows you to be functional, move, walk, and tolerate some activity is the goal. The pain will continue to improve over the days after surgery and is dependent on your surgery. -Due to [place] law, we are only able to give a certain amount of pain medication to treat post operative pain, and we only give additional narcotics on a patient by patient basis.  -For most laparoscopic surgery, studies have shown that the majority of patients only need 10-15 narcotic pills, and for open surgeries most patients only need 15-20.   -Having appropriate expectations of pain and knowledge of pain management with non narcotics is important as we do not want anyone to become addicted to narcotic pain medication.  -Using ice packs in the first 48 hours and heating pads after 48 hours, wearing an abdominal binder (when recommended), and using over the counter medications are all ways to help with pain management.   -Simple acts like meditation and mindfulness practices after surgery can also help with pain control and research has proven the benefit of these practices.  Medication: Take tylenol and ibuprofen as needed for pain control, alternating every 4-6 hours.  Example:  Tylenol 1000mg  @ 6am, 12noon, 6pm, 56midnight (Do not exceed 4000mg  of tylenol a day). Ibuprofen 800mg  @ 9am, 3pm, 9pm, 3am (Do not exceed 3600mg  of ibuprofen a day).  Take  Roxicodone for breakthrough pain every 4 hours.  Take Colace for constipation related to narcotic pain medication. If you do not have a bowel movement in 2 days, take Miralax over the counter.  Drink plenty of water to also prevent constipation.   Contact Information: If you have questions or concerns, please call our office, [phone], Monday- Thursday 8AM-5PM and Friday 8AM-12Noon.  If it is after hours or on the weekend, please call [facility] Main Number, [phone],  and ask to speak to the surgeon on call for Dr. Constance Haw at [facility].   Open Small Bowel Resection, Care After This sheet gives you information about how to care for yourself after your procedure. Your health care provider may also give you more specific instructions. If you have problems or questions, contact your health care provider. What can I expect after the procedure? After the procedure, it is common to have:  Pain in your abdomen, especially along your incision. You will be given pain medicines to control this.  Tiredness. This is a normal part of the recovery process. Your energy level will return to normal over the next several weeks.  Constipation. You may be given a stool softener to help prevent this. Follow these instructions at home: Medicines  Take over-the-counter and prescription medicines only as told by your health care provider.  Ask your health care provider if the medicine prescribed to you requires you to avoid driving or using heavy machinery.  If you were prescribed an antibiotic medicine, use it as told by your health care provider. Do not stop using the antibiotic even if you start to feel better. Activity  Return to your normal activities as told by your health care provider. Ask your health care provider what activities are safe for you.  Do not lift anything that is heavier than 10 lb (4.5 kg), or the limit that you are told, until your health care provider says that it is safe.  Rest as told by your health care provider.  Avoid sitting for a long time without moving. Get up to take short walks every 1-2 hours. This is important to improve blood flow and breathing. Ask for help if you feel weak or unsteady.  Avoid activities that take a lot of effort for as long as told by your health care provider. Incision care   Follow instructions from your health care provider about how to take care of your incision. Make sure you: ? Wash your hands with soap  and water before and after you change your bandage (dressing). If soap and water are not available, use hand sanitizer. ? Change your dressing as told by your health care provider. ? Leave stitches (sutures), skin glue, or adhesive strips in place. These skin closures may need to stay in place for 2 weeks or longer. If adhesive strip edges start to loosen and curl up, you may trim the loose edges. Do not remove adhesive strips completely unless your health care provider tells you to do that.  Check your incision area every day for signs of infection. Check for: ? Redness, swelling, or pain. ? Fluid or blood. ? Warmth. ? Pus or a bad smell.  Do not take baths, swim, or use a hot tub until your health care provider approves. Ask your health care provider if you may take showers. You may only be allowed to take sponge baths. Managing constipation You may need to take these actions to prevent or treat constipation:  Drink enough fluid to  keep your urine pale yellow.  Take over-the-counter or prescription medicines.  Eat foods that are high in fiber, such as beans, whole grains, and fresh fruits and vegetables.  Limit foods that are high in fat and processed sugars, such as fried or sweet foods.  General instructions  Continue to practice deep breathing and coughing. If it hurts to cough, try holding a pillow against your abdomen as you cough.  If you were given a breathing device (incentive spirometer), continue to use it as told by your health care provider.  Wear compression stockings as told by your health care provider. These stockings help to prevent blood clots and reduce swelling in your legs.  Do not use any products that contain nicotine or tobacco, such as cigarettes, e-cigarettes, and chewing tobacco. These can delay incision healing after surgery. If you need help quitting, ask your health care provider.  Keep all follow-up visits as told by your health care provider. This is  important. Contact a health care provider if:  You have pain that is not relieved with medicine.  You do not feel like eating.  You feel nauseous or you vomit.  You have constipation that is not relieved with prescribed stool softeners.  You have a fever.  You have any of these signs of infection: ? Redness, swelling, or pain around your incision. ? Fluid or blood coming from your incision. ? Warmth coming from your incision. ? Pus or a bad smell coming from your incision.  Your incision breaks open.  You have bleeding from the rectum.  You have swelling of your abdomen. Get help right away if:  Your pain gets worse, even after you take pain medicine.  Your legs or arms become painful, red, or swollen.  You have chest pain.  You have trouble breathing.  You cannot have a bowel movement or pass gas.  You have persistent nausea or vomiting.  You have increased abdominal pain. These symptoms may represent a serious problem that is an emergency. Do not wait to see if the symptoms will go away. Get medical help right away. Call your local emergency services (911 in the [place] Do not drive yourself to the hospital. Summary  After the procedure, it is common to have pain in your abdomen and incision areas, tiredness, and constipation.  Take over-the-counter and prescription medicines only as told by your health care provider.  Return to your normal activities as told by your health care provider. Ask your health care provider what activities are safe for you.  Follow instructions from your health care provider about how to take care of your incision area.  Contact a health care provider if you have pain that is not relieved with medicine or if you have any signs of infection. This information is not intended to replace advice given to you by your health care provider. Make sure you discuss any questions you have with your health care provider. Document Revised: 06/10/2018  Document Reviewed: 06/10/2018 Elsevier Patient Education  [place], Adult, Care After These instructions give you information about caring for yourself after your procedure. Your doctor may also give you more specific instructions. If you have problems or questions, contact your doctor. Follow these instructions at home: Surgical cut (incision) care   Follow instructions from your doctor about how to take care of your surgical cut area. Make sure you: ? Wash your hands with soap and water before you change your bandage (dressing).  If you cannot use soap and water, use hand sanitizer. ? Change your bandage as told by your doctor. ? Leave stitches (sutures), skin glue, or skin tape (adhesive) strips in place. They may need to stay in place for 2 weeks or longer. If tape strips get loose and curl up, you may trim the loose edges. Do not remove tape strips completely unless your doctor says it is okay.  Check your surgical cut every day for signs of infection. Check for: ? More redness, swelling, or pain. ? More fluid or blood. ? Warmth. ? Pus or a bad smell. Activity  Do not drive or use heavy machinery while taking prescription pain medicine. Do not drive until your doctor says it is okay.  Until your doctor says it is okay: ? Do not lift anything that is heavier than 10 lb (4.5 kg). ? Do not play contact sports.  Return to your normal activities as told by your doctor. Ask your doctor what activities are safe. General instructions  To prevent or treat having a hard time pooping (constipation) while you are taking prescription pain medicine, your doctor may recommend that you: ? Drink enough fluid to keep your pee (urine) clear or pale yellow. ? Take over-the-counter or prescription medicines. ? Eat foods that are high in fiber, such as fresh fruits and vegetables, whole grains, and beans. ? Limit foods that are high in fat and processed sugars, such  as fried and sweet foods.  Take over-the-counter and prescription medicines only as told by your doctor.  Do not take baths, swim, or use a hot tub until your doctor says it is okay.  Keep all follow-up visits as told by your doctor. This is important. Contact a doctor if:  You develop a rash.  You have more redness, swelling, or pain around your surgical cut.  You have more fluid or blood coming from your surgical cut.  Your surgical cut feels warm to the touch.  You have pus or a bad smell coming from your surgical cut.  You have a fever or chills.  You have blood in your poop (stool).  You have not pooped in 2-3 days.  Medicine does not help your pain. Get help right away if:  You have chest pain or you are short of breath.  You feel light-headed.  You feel weak and dizzy (feel faint).  You have very bad pain.  You throw up (vomit) and your pain is worse. This information is not intended to replace advice given to you by your health care provider. Make sure you discuss any questions you have with your health care provider. Document Revised: 04/30/2018 Document Reviewed: 06/20/2015 Elsevier Patient Education  [place].

## 2019-07-03 NOTE — TOC Progression Note (Signed)
Transition of Care [facility]) - Progression Note    Patient Details  Name: Derek Foster MRN: [id number] Date of Birth: 11/09/1935  [facility] CM/SW Contact  Natasha Bence, LCSW Phone Number: 07/03/2019, 3:05 PM  Clinical Narrative:    Peer to Peer consult conducted resulting in overturning of authorization Denial per Dr. Manuella Ghazi. [facility] has accepted patient. Follow up with [facility] Monday 07/04/19 to notify them of discharge. TOC to follow.    Expected Discharge Plan: Skilled Nursing Facility Barriers to Discharge: Continued Medical Work up  Expected Discharge Plan and Services Expected Discharge Plan: [place] In-house Referral: Clinical Social Work   Post Acute Care Choice: [place] Living arrangements for the past 2 months: Single Family Home                                       Social Determinants of Health (SDOH) Interventions    Readmission Risk Interventions Readmission Risk Prevention Plan 06/30/2019 03/11/2019  Transportation Screening Complete Complete  PCP or Specialist Appt within 3-5 Days Not Complete Not Complete  Not Complete comments SNF is expected discharge plan plan for SNF  HRI or Home Care Consult Complete Not Complete  HRI or Home Care Consult comments - plan for SNF  Social Work Consult for [place] Planning/Counseling Complete Complete  Palliative Care Screening Not Applicable Complete  Medication Review (RN Care Manager) Complete Referral to Pharmacy  Some recent data might be hidden

## 2019-07-03 NOTE — Progress Notes (Signed)
PROGRESS NOTE    Derek Foster  MVH:[id number] DOB: [birth year] DOA: 06/25/2019 PCP: Celene Squibb, MD   Brief Narrative:  Derek Foster a 84 y.o.malewith a history of CAD with history of MI and stenting, A. fib on chronic anticoagulation, hypertension, hyperlipidemia, hypothyroidism, GERD, chronic indwelling urinary catheter, ventral hernia. Patient presents to the hospital with 2 days of generalized lower abdominal pain with coffee-ground emesis with a couple of episodes today. No diarrhea. Reports normal bowel movement yesterday. No hematuria, melena, hematochezia.  Emergency Department Course: CT of the abdomen/pelvis shows small bowel obstructions coming from ventral hernia with loop of bowel in the hernia. General surgery consulted.NG tube placed.  -Diet being advanced by general surgery. Plan for discharge to SNF in next 1-2 days if stable. IV antibiotics to p.o. Replete potassium.  Assessment & Plan:   Principal Problem:   Small bowel obstruction (HCC) Active Problems:   Hyperthyroidism   Essential hypertension   CAD S/P percutaneous coronary angioplasty   Atrial fibrillation, chronic (HCC)   AKI (acute kidney injury) (HCC)   BPH (benign prostatic hyperplasia)   GERD without esophagitis   Anticoagulated   Candidal UTI (urinary tract infection)   Ventral hernia with bowel obstruction   #1 recurrent small bowel obstruction secondary to ventral hernia  -status post small bowel resection and primary repair of the hernia 06/28/2019 by Dr. Constance Haw. -Follow postoperative recommendation -Advance to full liquid and then further as tolerated per general surgery Follow-up in per general surgery. NG tube removed on 6/10 by general surgery  #2 history of A. fib was on Eliquisprior to admission -Okay to resume Eliquis 6/12 per general surgery -Continue amiodaroneand metoprolol. -continue telemetry monitoring.  #3 history of CAD status post stent LAD PCI in 2014.   -Ejection fraction 60 to 65%. -continueon metoprolol with dose increased to 50 mg twice daily given hypertension last night, continue to monitor -continue follow up with cardiology.  #4 AKI -creatinine 1.15down from 1.7 on admission.  -Holding further IV fluid -Responded well to IV Lasix on 6/10 with greater than 4.5 L urine output noted. -follow Cr trend  #5 history of BPH -No complaints of urinary retention symptoms -Continue Flomax and finasteride.  #6 history of GERD -continue PPI  #7 history of hyper thyroidism -Continue methimazole.  #8 coffee-ground emesis reported by the patientprior to admission -Hemoglobin 8.9 down from 11.2 with no evidence of active bleeding hemodilution could be playing a role. -FOBT is positive however he is not having any active bleeding will have her to follow-up with GI as an outpatient. -continue PPI  #9 Proteus UTI in the setting of chronic Foley catheter -Finished course of treatment and now on Augmentin for suspected aspiration pneumonia -Catheter exchange during inpatient stay  #10 hypokalemia -Repleteorally today for 3 doses -Plan is to maintain potassium above4and magnesium above 2. -Recheck levels in a.m.  #11 pressure injury: present on admission Pressure Injury 03/12/19 Buttocks Left Stage 2 - Partial thickness loss of dermis presenting as a shallow open injury with a red, pink wound bed without slough. (Active)  03/12/19 0924  Location: Buttocks  Location Orientation: Left  Staging: Stage 2 - Partial thickness loss of dermis presenting as a shallow open injury with a red, pink wound bed without slough.  Wound Description (Comments):   Present on Admission:   #12right-sided infiltrate with concern for aspiration pneumonia -Azithromycin and Zosyn discontinued today -Start on Augmentin for 3 more days to complete total 7-day course of treatment -Mucinex  for congestion  Estimated body mass  index is 35.82 kg/m as calculated from the following: Height as of this encounter: 6' (1.829 m). Weight as of this encounter: 119.8 kg.  DVT prophylaxis:Eliquis resumed Code Status:Full code Family Communicationplan to call daughter-in-lawon phone. Disposition Plan:Remains inpatient. Continue slowly advancing diet and following postoperative recommendations by general surgery. Continue resuming oral medications as tolerated and continue IV fluids/electrolyte repletion. Currently with ileus and no medically ready for discharge.  Dispo: The patient is from:Home Anticipated d/c is to:SNF Anticipated d/c date is:1-2 days  Patient currentlyis not medically stable to d/c.Advancing diet per general surgery.  Will discharge to SNF once stable hopefully in 2 days.   Consultants: General surgery Cardiology. Care discussed with GI(Dr. Rourk)over the phone:recommended outpatient follow-up after discharge. Continue PPI.  ProceduresNG tube 06/25/2019   Antimicrobials:  Anti-infectives (From admission, onward)   Start     Dose/Rate Route Frequency Ordered Stop   07/03/19 1215  amoxicillin-clavulanate (AUGMENTIN) 875-125 MG per tablet 1 tablet     Discontinue     1 tablet Oral Every 12 hours 07/03/19 1208     06/30/19 1400  piperacillin-tazobactam (ZOSYN) IVPB 3.375 g  Status:  Discontinued        3.375 g 12.5 mL/hr over 240 Minutes Intravenous Every 8 hours 06/30/19 0422 07/03/19 1208   06/30/19 0430  cefTRIAXone (ROCEPHIN) 2 g in sodium chloride 0.9 % 100 mL IVPB  Status:  Discontinued        2 g 200 mL/hr over 30 Minutes Intravenous Every 24 hours 06/30/19 0415 06/30/19 0416   06/30/19 0430  azithromycin (ZITHROMAX) 500 mg in sodium chloride 0.9 % 250 mL IVPB  Status:  Discontinued        500 mg 250 mL/hr over 60 Minutes Intravenous Every 24 hours 06/30/19 0415 07/03/19 1208   06/30/19 0430  piperacillin-tazobactam (ZOSYN)  IVPB 3.375 g        3.375 g 100 mL/hr over 30 Minutes Intravenous STAT 06/30/19 0422 06/30/19 0614   06/29/19 0900  ceFAZolin (ANCEF) IVPB 1 g/50 mL premix  Status:  Discontinued        1 g 100 mL/hr over 30 Minutes Intravenous Every 8 hours 06/29/19 0834 06/30/19 0415   06/28/19 2200  ceFAZolin (ANCEF) IVPB 1 g/50 mL premix  Status:  Discontinued        1 g 100 mL/hr over 30 Minutes Intravenous Every 12 hours 06/28/19 1109 06/29/19 0834   06/28/19 0600  ceFAZolin (ANCEF) IVPB 2g/100 mL premix        2 g 200 mL/hr over 30 Minutes Intravenous On call to O.R. 06/27/19 2037 06/28/19 1309   06/27/19 1445  cefTRIAXone (ROCEPHIN) 1 g in sodium chloride 0.9 % 100 mL IVPB  Status:  Discontinued        1 g 200 mL/hr over 30 Minutes Intravenous Every 24 hours 06/27/19 1434 06/28/19 1107   06/26/19 1845  fluconazole (DIFLUCAN) IVPB 100 mg  Status:  Discontinued       "Followed by" Linked Group Details   100 mg 50 mL/hr over 60 Minutes Intravenous Every 24 hours 06/25/19 1832 06/26/19 1356   06/25/19 1845  fluconazole (DIFLUCAN) IVPB 200 mg       "Followed by" Linked Group Details   200 mg 100 mL/hr over 60 Minutes Intravenous  Once 06/25/19 1832 06/25/19 2030       Subjective: Patient seen and evaluated today with no new acute complaints or concerns. No acute concerns or events  noted overnight. He is tolerating his diet well and did have 2 large bowel movements overnight. He has decreased chest congestion.  Objective: Vitals:   07/02/19 0549 07/02/19 1354 07/02/19 2119 07/03/19 0418  BP: (!) 151/50 140/60 (!) 170/58 (!) 158/56  Pulse: 61  61 (!) 57  Resp: 16 18 18 20   Temp: 98.4 F (36.9 C) 98 F (36.7 C) 98.3 F (36.8 C) 98.4 F (36.9 C)  TempSrc: Oral Oral Oral Oral  SpO2: 97% 99% 100% 96%  Weight:      Height:        Intake/Output Summary (Last 24 hours) at 07/03/2019 1209 Last data filed at 07/03/2019 0547 Gross per 24 hour  Intake 938.67 ml  Output 1350 ml  Net -411.33 ml     Filed Weights   06/25/19 2330 06/29/19 0500  Weight: 119 kg 119.8 kg    Examination:  General exam: Appears calm and comfortable  Respiratory system: Clear to auscultation. Respiratory effort normal. Cardiovascular system: S1 & S2 heard, RRR. No JVD, murmurs, rubs, gallops or clicks. No pedal edema. Gastrointestinal system: Abdomen midline incision clean dry and intact. Central nervous system: Alert and oriented. No focal neurological deficits. Extremities: Symmetric 5 x 5 power. Skin: No rashes, lesions or ulcers Psychiatry: Judgement and insight appear normal. Mood & affect appropriate.     Data Reviewed: I have personally reviewed following labs and imaging studies  CBC: Recent Labs  Lab 06/28/19 0510 06/29/19 0353 06/30/19 0837 07/01/19 0601 07/02/19 0603  WBC 7.2 10.4 9.5 8.1 7.4  HGB 9.8* 10.4* 9.8* 8.8* 8.9*  HCT 30.5* 31.9* 30.4* 27.4* 28.1*  MCV 88.9 87.2 87.4 88.1 88.1  PLT 162 202 190 199 315   Basic Metabolic Panel: Recent Labs  Lab 06/29/19 0353 06/30/19 0348 06/30/19 0837 07/01/19 0601 07/02/19 0603 07/03/19 0638  NA 139  --  140 139 140 138  K 3.4*  --  3.4* 3.1* 3.4* 3.0*  CL 106  --  109 105 106 107  CO2 24  --  23 25 24 25   GLUCOSE 118*  --  97 99 114* 102*  BUN 13  --  11 13 13 11   CREATININE 1.15  --  1.08 1.37* 1.22 1.15  CALCIUM 8.2*  --  7.8* 7.7* 8.0* 7.9*  MG 1.8 1.8  --  1.8 1.8 1.8   GFR: Estimated Creatinine Clearance: 66.2 mL/min (by C-G formula based on SCr of 1.15 mg/dL). Liver Function Tests: Recent Labs  Lab 06/28/19 0510 06/29/19 0353  AST 12* 12*  ALT 15 13  ALKPHOS 76 74  BILITOT 0.4 0.6  PROT 5.2* 5.7*  ALBUMIN 2.8* 2.8*   No results for input(s): LIPASE, AMYLASE in the last 168 hours. No results for input(s): AMMONIA in the last 168 hours. Coagulation Profile: No results for input(s): INR, PROTIME in the last 168 hours. Cardiac Enzymes: No results for input(s): CKTOTAL, CKMB, CKMBINDEX, TROPONINI in the  last 168 hours. BNP (last 3 results) No results for input(s): PROBNP in the last 8760 hours. HbA1C: No results for input(s): HGBA1C in the last 72 hours. CBG: No results for input(s): GLUCAP in the last 168 hours. Lipid Profile: No results for input(s): CHOL, HDL, LDLCALC, TRIG, CHOLHDL, LDLDIRECT in the last 72 hours. Thyroid Function Tests: No results for input(s): TSH, T4TOTAL, FREET4, T3FREE, THYROIDAB in the last 72 hours. Anemia Panel: No results for input(s): VITAMINB12, FOLATE, FERRITIN, TIBC, IRON, RETICCTPCT in the last 72 hours. Sepsis Labs: No results for  input(s): PROCALCITON, LATICACIDVEN in the last 168 hours.  Recent Results (from the past 240 hour(s))  SARS Coronavirus 2 by RT PCR (hospital order, performed in [facility] hospital lab) Nasopharyngeal Nasopharyngeal Swab     Status: None   Collection Time: 06/25/19  5:32 PM   Specimen: Nasopharyngeal Swab  Result Value Ref Range Status   SARS Coronavirus 2 NEGATIVE NEGATIVE Final    Comment: (NOTE) SARS-CoV-2 target nucleic acids are NOT DETECTED. The SARS-CoV-2 RNA is generally detectable in upper and lower respiratory specimens during the acute phase of infection. The lowest concentration of SARS-CoV-2 viral copies this assay can detect is 250 copies / mL. A negative result does not preclude SARS-CoV-2 infection and should not be used as the sole basis for treatment or other patient management decisions.  A negative result may occur with improper specimen collection / handling, submission of specimen other than nasopharyngeal swab, presence of viral mutation(s) within the areas targeted by this assay, and inadequate number of viral copies (<250 copies / mL). A negative result must be combined with clinical observations, patient history, and epidemiological information. Fact Sheet for Patients:   StrictlyIdeas.no Fact Sheet for Healthcare  Providers: BankingDealers.co.za This test is not yet approved or cleared  by the [place] FDA and has been authorized for detection and/or diagnosis of SARS-CoV-2 by FDA under an Emergency Use Authorization (EUA).  This EUA will remain in effect (meaning this test can be used) for the duration of the COVID-19 declaration under Section 564(b)(1) of the Act, 21 U.S.C. section 360bbb-3(b)(1), unless the authorization is terminated or revoked sooner. Performed at [facility], [address] [place], [place] [address]   Urine culture     Status: Abnormal   Collection Time: 06/25/19  5:59 PM   Specimen: Urine, Catheterized  Result Value Ref Range Status   Specimen Description   Final    URINE, CATHETERIZED Performed at [facility], [address] [place], [place] [address]    Special Requests   Final    NONE Performed at [facility], [address] [place], [place] [address]    Culture >=100,000 COLONIES/mL PROTEUS MIRABILIS (A)  Final   Report Status 06/28/2019 FINAL  Final   Organism ID, Bacteria PROTEUS MIRABILIS (A)  Final      Susceptibility   Proteus mirabilis - MIC*    AMPICILLIN <=2 SENSITIVE Sensitive     CEFAZOLIN <=4 SENSITIVE Sensitive     CEFTRIAXONE <=1 SENSITIVE Sensitive     CIPROFLOXACIN 0.5 SENSITIVE Sensitive     GENTAMICIN <=1 SENSITIVE Sensitive     IMIPENEM 4 SENSITIVE Sensitive     NITROFURANTOIN 256 RESISTANT Resistant     TRIMETH/SULFA <=20 SENSITIVE Sensitive     AMPICILLIN/SULBACTAM <=2 SENSITIVE Sensitive     PIP/TAZO <=4 SENSITIVE Sensitive     * >=100,000 COLONIES/mL PROTEUS MIRABILIS  Surgical pcr screen     Status: None   Collection Time: 06/27/19  8:39 PM   Specimen: Nasal Mucosa; Nasal Swab  Result Value Ref Range Status   MRSA, PCR NEGATIVE NEGATIVE Final   Staphylococcus aureus NEGATIVE NEGATIVE Final    Comment: (NOTE) The Xpert SA Assay (FDA approved for NASAL specimens in patients 31 years of age and older),  is one component of a comprehensive surveillance program. It is not intended to diagnose infection nor to guide or monitor treatment. Performed at [facility], [address], [place], [place] [address]   MRSA PCR Screening     Status:  None   Collection Time: 06/30/19  4:16 AM   Specimen: Nasopharyngeal  Result Value Ref Range Status   MRSA by PCR NEGATIVE NEGATIVE Final    Comment:        The GeneXpert MRSA Assay (FDA approved for NASAL specimens only), is one component of a comprehensive MRSA colonization surveillance program. It is not intended to diagnose MRSA infection nor to guide or monitor treatment for MRSA infections. Performed at [facility], [address] [place], [place] [address]   Culture, sputum-assessment     Status: None   Collection Time: 06/30/19  8:05 AM   Specimen: Sputum  Result Value Ref Range Status   Specimen Description SPUTUM  Final   Special Requests Immunocompromised  Final   Sputum evaluation   Final    Sputum specimen not acceptable for testing.  Please recollect.   NOTIFIED Cristopher Peru @1002  06/30/2019 BY JONES,T Performed at [facility], [address], [place], [place] [address]    Report Status 06/30/2019 FINAL  Final         Radiology Studies: DG Chest 1 View  Result Date: 07/02/2019 CLINICAL DATA:  Chest congestion. EXAM: CHEST  1 VIEW COMPARISON:  06/30/2019; 06/25/2019 FINDINGS: Grossly unchanged borderline enlarged cardiac silhouette and mediastinal contours with atherosclerotic plaque within the aortic arch. Mild pulmonary venous congestion without frank evidence of edema. Previously questioned right lower lung nodular opacities are less conspicuous on the present examination. No discrete focal airspace opacities. No pleural effusion or pneumothorax. No acute osseous abnormalities. IMPRESSION: 1. Similar findings of borderline cardiomegaly and pulmonary venous congestion without definitive superimposed acute cardiopulmonary disease.  2. Questioned right lower lung nodular opacities are less conspicuous on present examination. Electronically Signed   By: Sandi Mariscal M.D.   On: 07/02/2019 06:11        Scheduled Meds: . amiodarone  200 mg Oral Daily  . amoxicillin-clavulanate  1 tablet Oral Q12H  . apixaban  5 mg Oral Q12H  . Chlorhexidine Gluconate Cloth  6 each Topical Daily  . finasteride  5 mg Oral Daily  . guaiFENesin  600 mg Oral BID  . hydrALAZINE  10 mg Oral Q8H  . methimazole  20 mg Oral BID  . metoprolol tartrate  50 mg Oral BID  . nystatin   Topical TID  . ofloxacin  1 drop Both Eyes QID  . pantoprazole (PROTONIX) IV  40 mg Intravenous Q12H  . potassium chloride  40 mEq Oral TID  . pravastatin  10 mg Oral q1800  . tamsulosin  0.4 mg Oral QPC breakfast   Continuous Infusions:   LOS: 8 days    Time spent: 35 minutes    Kevis Qu Darleen Crocker, DO [facility]  If 7PM-7AM, please contact night-coverage [email or web address] 07/03/2019, 12:09 PM

## 2019-07-03 NOTE — Progress Notes (Signed)
Pharmacy Antibiotic Note  Derek Foster is a 84 y.o. male admitted on 06/25/2019 with small bowel obstruction/UTI.  Pharmacy has been consulted for Zosyn dosing. Pt also on azithromycin.  Plan: Continue Zosyn 3.375gm IV q8h F/u renal function, micro data, and pt's clinical condition   Height: 6' (182.9 cm) Weight: 119.8 kg (264 lb 1.8 oz) IBW/kg (Calculated) : 77.6  Temp (24hrs), Avg:98.2 F (36.8 C), Min:98 F (36.7 C), Max:98.4 F (36.9 C)  Recent Labs  Lab 06/28/19 0510 06/28/19 0510 06/28/19 1808 06/29/19 0353 06/30/19 0837 07/01/19 0601 07/02/19 0603  WBC 7.2  --   --  10.4 9.5 8.1 7.4  CREATININE 1.25*   < > 1.27* 1.15 1.08 1.37* 1.22   < > = values in this interval not displayed.    Estimated Creatinine Clearance: 62.4 mL/min (by C-G formula based on SCr of 1.22 mg/dL).    Allergies  Allergen Reactions  . Indomethacin Anaphylaxis    But tolerates ibuprofen, aleve  . Iodine-131 Anaphylaxis    Antimicrobials this admission: 6/10 Zosyn >>  6/10 Azithromycin >>    Thank you for allowing pharmacy to be a part of this patient's care.  Margot Ables, PharmD Clinical Pharmacist 07/03/2019 8:15 AM

## 2019-07-03 NOTE — Progress Notes (Signed)
[facility]  Doing well. Having Bms. Tolerating diet.   BP (!) 131/51   Pulse (!) 57   Temp 98.4 F (36.9 C) (Oral)   Resp 20   Ht 6' (1.829 m)   Wt 119.8 kg   SpO2 100%   BMI 35.82 kg/m  NAD Normal work breathing Dressing removed, staples c/d/i with yellowing bruise, no drainage or erythema MAE  Roxicodone added for pain if needed, not really taking much in for pain IS, OOB Planning for rehab Soft diet, colace added Will need staples out at 2 weeks, will get him an appt with Dr. Arnoldo Morale 6/22 No heavy lifting > 10 lbs, excessive bending, pushing, pulling, or squatting for 6-8 weeks after surgery.  May shower.  Dry dressing over staples daily or as desired/ Abdominal binder   Curlene Labrum, MD [facility] [address] [place], [place] [address] [phone] (office)

## 2019-07-04 ENCOUNTER — Other Ambulatory Visit: Payer: Self-pay | Admitting: Adult Health

## 2019-07-04 ENCOUNTER — Inpatient Hospital Stay
Admission: RE | Admit: 2019-07-04 | Discharge: 2019-07-20 | Disposition: A | Discharge status: Home / Self Care | Payer: PPO | Source: Ambulatory Visit | Attending: Internal Medicine | Admitting: Internal Medicine

## 2019-07-04 DIAGNOSIS — T83511D Infection and inflammatory reaction due to indwelling urethral catheter, subsequent encounter: Secondary | ICD-10-CM | POA: Diagnosis not present

## 2019-07-04 DIAGNOSIS — Z978 Presence of other specified devices: Secondary | ICD-10-CM | POA: Diagnosis not present

## 2019-07-04 DIAGNOSIS — K219 Gastro-esophageal reflux disease without esophagitis: Secondary | ICD-10-CM | POA: Diagnosis not present

## 2019-07-04 DIAGNOSIS — N400 Enlarged prostate without lower urinary tract symptoms: Secondary | ICD-10-CM | POA: Diagnosis not present

## 2019-07-04 DIAGNOSIS — J690 Pneumonitis due to inhalation of food and vomit: Secondary | ICD-10-CM | POA: Diagnosis not present

## 2019-07-04 DIAGNOSIS — N179 Acute kidney failure, unspecified: Secondary | ICD-10-CM | POA: Diagnosis not present

## 2019-07-04 DIAGNOSIS — B964 Proteus (mirabilis) (morganii) as the cause of diseases classified elsewhere: Secondary | ICD-10-CM | POA: Diagnosis not present

## 2019-07-04 DIAGNOSIS — Z7901 Long term (current) use of anticoagulants: Secondary | ICD-10-CM | POA: Diagnosis not present

## 2019-07-04 DIAGNOSIS — E43 Unspecified severe protein-calorie malnutrition: Secondary | ICD-10-CM | POA: Diagnosis not present

## 2019-07-04 DIAGNOSIS — R262 Difficulty in walking, not elsewhere classified: Secondary | ICD-10-CM | POA: Diagnosis not present

## 2019-07-04 DIAGNOSIS — K56609 Unspecified intestinal obstruction, unspecified as to partial versus complete obstruction: Secondary | ICD-10-CM | POA: Diagnosis not present

## 2019-07-04 DIAGNOSIS — R600 Localized edema: Secondary | ICD-10-CM | POA: Diagnosis not present

## 2019-07-04 DIAGNOSIS — E782 Mixed hyperlipidemia: Secondary | ICD-10-CM | POA: Diagnosis not present

## 2019-07-04 DIAGNOSIS — M5416 Radiculopathy, lumbar region: Secondary | ICD-10-CM | POA: Diagnosis not present

## 2019-07-04 DIAGNOSIS — I480 Paroxysmal atrial fibrillation: Secondary | ICD-10-CM | POA: Diagnosis not present

## 2019-07-04 DIAGNOSIS — D649 Anemia, unspecified: Secondary | ICD-10-CM | POA: Diagnosis not present

## 2019-07-04 DIAGNOSIS — I10 Essential (primary) hypertension: Secondary | ICD-10-CM | POA: Diagnosis not present

## 2019-07-04 DIAGNOSIS — I2510 Atherosclerotic heart disease of native coronary artery without angina pectoris: Secondary | ICD-10-CM | POA: Diagnosis not present

## 2019-07-04 DIAGNOSIS — Z741 Need for assistance with personal care: Secondary | ICD-10-CM | POA: Diagnosis not present

## 2019-07-04 DIAGNOSIS — R195 Other fecal abnormalities: Secondary | ICD-10-CM | POA: Diagnosis not present

## 2019-07-04 DIAGNOSIS — D62 Acute posthemorrhagic anemia: Secondary | ICD-10-CM | POA: Diagnosis not present

## 2019-07-04 DIAGNOSIS — E0590 Thyrotoxicosis, unspecified without thyrotoxic crisis or storm: Secondary | ICD-10-CM | POA: Diagnosis not present

## 2019-07-04 DIAGNOSIS — Z48815 Encounter for surgical aftercare following surgery on the digestive system: Secondary | ICD-10-CM | POA: Diagnosis not present

## 2019-07-04 DIAGNOSIS — E785 Hyperlipidemia, unspecified: Secondary | ICD-10-CM | POA: Diagnosis not present

## 2019-07-04 DIAGNOSIS — N401 Enlarged prostate with lower urinary tract symptoms: Secondary | ICD-10-CM | POA: Diagnosis not present

## 2019-07-04 DIAGNOSIS — K436 Other and unspecified ventral hernia with obstruction, without gangrene: Secondary | ICD-10-CM | POA: Diagnosis not present

## 2019-07-04 DIAGNOSIS — M5417 Radiculopathy, lumbosacral region: Secondary | ICD-10-CM | POA: Diagnosis not present

## 2019-07-04 DIAGNOSIS — E0789 Other specified disorders of thyroid: Secondary | ICD-10-CM | POA: Diagnosis not present

## 2019-07-04 DIAGNOSIS — N390 Urinary tract infection, site not specified: Secondary | ICD-10-CM | POA: Diagnosis not present

## 2019-07-04 DIAGNOSIS — Z95828 Presence of other vascular implants and grafts: Secondary | ICD-10-CM | POA: Diagnosis not present

## 2019-07-04 DIAGNOSIS — I482 Chronic atrial fibrillation, unspecified: Secondary | ICD-10-CM | POA: Diagnosis not present

## 2019-07-04 DIAGNOSIS — K921 Melena: Secondary | ICD-10-CM | POA: Diagnosis not present

## 2019-07-04 DIAGNOSIS — F439 Reaction to severe stress, unspecified: Secondary | ICD-10-CM | POA: Diagnosis not present

## 2019-07-04 DIAGNOSIS — N4289 Other specified disorders of prostate: Secondary | ICD-10-CM | POA: Diagnosis not present

## 2019-07-04 DIAGNOSIS — M6281 Muscle weakness (generalized): Secondary | ICD-10-CM | POA: Diagnosis not present

## 2019-07-04 DIAGNOSIS — E876 Hypokalemia: Secondary | ICD-10-CM | POA: Diagnosis not present

## 2019-07-04 LAB — CBC
HCT: 27 % — ABNORMAL LOW (ref 39.0–52.0)
Hemoglobin: 8.4 g/dL — ABNORMAL LOW (ref 13.0–17.0)
MCH: 27.5 pg (ref 26.0–34.0)
MCHC: 31.1 g/dL (ref 30.0–36.0)
MCV: 88.2 fL (ref 80.0–100.0)
Platelets: 206 10*3/uL (ref 150–400)
RBC: 3.06 MIL/uL — ABNORMAL LOW (ref 4.22–5.81)
RDW: 15.3 % (ref 11.5–15.5)
WBC: 6.1 10*3/uL (ref 4.0–10.5)
nRBC: 0 % (ref 0.0–0.2)

## 2019-07-04 LAB — BASIC METABOLIC PANEL
Anion gap: 7 (ref 5–15)
BUN: 10 mg/dL (ref 8–23)
CO2: 24 mmol/L (ref 22–32)
Calcium: 7.9 mg/dL — ABNORMAL LOW (ref 8.9–10.3)
Chloride: 109 mmol/L (ref 98–111)
Creatinine, Ser: 1.05 mg/dL (ref 0.61–1.24)
GFR calc Af Amer: >60 mL/min (ref >60)
GFR calc non Af Amer: >60 mL/min (ref >60)
Glucose, Bld: 100 mg/dL — ABNORMAL HIGH (ref 70–99)
Potassium: 3.8 mmol/L (ref 3.5–5.1)
Sodium: 140 mmol/L (ref 135–145)

## 2019-07-04 LAB — MAGNESIUM: Magnesium: 1.8 mg/dL (ref 1.7–2.4)

## 2019-07-04 LAB — SARS CORONAVIRUS 2 BY RT PCR (HOSPITAL ORDER, PERFORMED IN ~~LOC~~ HOSPITAL LAB): SARS Coronavirus 2: NEGATIVE

## 2019-07-04 MED ORDER — DOCUSATE SODIUM 100 MG PO CAPS: 100.0000 mg | ORAL_CAPSULE | Freq: Two times a day (BID) | ORAL | 0 refills | Status: DC

## 2019-07-04 MED ORDER — POTASSIUM CHLORIDE CRYS ER 10 MEQ PO TBCR
10.0000 meq | EXTENDED_RELEASE_TABLET | Freq: Every day | ORAL | Status: DC
  Administered 2019-07-04: 10 meq via ORAL
  Filled 2019-07-04: qty 1

## 2019-07-04 MED ORDER — OXYCODONE HCL 5 MG PO TABS: 5.0000 mg | ORAL_TABLET | Freq: Four times a day (QID) | ORAL | 0 refills | Status: DC | PRN

## 2019-07-04 MED ORDER — GUAIFENESIN ER 600 MG PO TB12: 600.0000 mg | ORAL_TABLET | Freq: Two times a day (BID) | ORAL | 0 refills | Status: AC

## 2019-07-04 MED ORDER — METHOCARBAMOL 750 MG PO TABS: 750.0000 mg | ORAL_TABLET | Freq: Three times a day (TID) | ORAL | 0 refills | Status: DC | PRN

## 2019-07-04 MED ORDER — AMOXICILLIN-POT CLAVULANATE 875-125 MG PO TABS: 1.0000 | ORAL_TABLET | Freq: Two times a day (BID) | ORAL | 0 refills | Status: AC

## 2019-07-04 MED ORDER — FUROSEMIDE 40 MG PO TABS
40.0000 mg | ORAL_TABLET | Freq: Every day | ORAL | Status: DC
  Administered 2019-07-04: 40 mg via ORAL
  Filled 2019-07-04: qty 1

## 2019-07-04 NOTE — Discharge Summary (Signed)
Physician Discharge Summary  QUIENTIN Foster XLK:[id number] DOB: [birth year] DOA: 06/25/2019  PCP: Celene Squibb, MD  Admit date: 06/25/2019  Discharge date: 07/04/2019  Admitted From:Home  Disposition:  SNF-[facility]  Recommendations for Outpatient Follow-up:  1. Follow up with PCP in 1-2 weeks 2. Follow-up with cardiology Dr. Oval Linsey which will be arranged in the next 2-3 weeks 3. Follow-up with Dr. Arnoldo Morale with general surgery on 07/12/2019 4. Staples may be removed in 2 weeks 5. No heavy lifting greater than 10 pounds, no excessive bending, pushing, pulling, or squatting for 6-8 weeks after surgery 6. He may shower 7. Dry dressing over staples daily or as desired/use of abdominal binder 8. Colace twice daily as ordered 9. Roxicodone 10 tablets with 0 refills for pain management as needed every 6 hours 10. Continue Lasix and potassium supplementation 11. Continue Augmentin as ordered for 2 more days to finish course of treatment for aspiration pneumonia 12. Repeat CBC/BMP in 1 week and follow-up with GI in 1-2 weeks.  Remain on PPI daily.  Home Health: None  Equipment/Devices: Has chronic Foley catheter  Discharge Condition: Stable  CODE STATUS: Full  Diet recommendation: Heart Healthy  Brief/Interim Summary: Derek Foster a 84 y.o.malewith a history of CAD with history of MI and stenting, A. fib on chronic anticoagulation, hypertension, hyperlipidemia, hypothyroidism, GERD, chronic indwelling urinary catheter, ventral hernia. Patient presents to the hospital with 2 days of generalized lower abdominal pain with coffee-ground emesis with a couple of episodes today. No diarrhea. Reports normal bowel movement yesterday. No hematuria, melena, hematochezia.  Emergency Department Course: CT of the abdomen/pelvis shows small bowel obstructions coming from ventral hernia with loop of bowel in the hernia. General surgery consulted.NG tube placed.  #1 recurrent small bowel  obstruction secondary to ventral hernia  -status post small bowel resection and primary repair of the hernia 06/28/2019 by Dr. Constance Haw. -Recommendations as noted above with staples out in 2 weeks -Follow-up with Dr. Arnoldo Morale with general surgery on 6/22  #2 history of A. fib was on Eliquisprior to admission -Okay to resume Eliquis 6/12 per general surgery -Continue amiodaroneand metoprolol.  #3 history of CAD status post stent LAD PCI in 2014.  -Ejection fraction 60 to 65%. -continueon metoprolol  -Follow-up with cardiology Dr. Oval Linsey in 2-3 weeks  #4 AKI -Currently resolved -Repeat BMP in 1 week  #5 history of BPH -No complaints of urinary retention symptoms -Continue Flomax and finasteride -Patient has chronic Foley catheter which was changed during this admission  #6 history of GERD -continue PPI  #7 history of hyper thyroidism -Continue methimazole.  #8 coffee-ground emesis reported by the patientprior to admission -Hemoglobincurrently 8.4. -Continue PPI -Follow-up CBC in 1 week -Follow-up with GI as scheduled  #9 Proteus UTI in the setting of chronic Foley catheter -Finished course of treatment and now on Augmentin for suspected aspiration pneumonia -Catheter exchange during inpatient stay, plan to repeat catheter exchange in the next 28 days.  #10 hypokalemia-resolved -Continue potassium supplementation with Lasix use in outpatient setting  #11 pressure injury: present on admission Pressure Injury 03/12/19 Buttocks Left Stage 2 - Partial thickness loss of dermis presenting as a shallow open injury with a red, pink wound bed without slough. (Active)  03/12/19 0924  Location: Buttocks  Location Orientation: Left  Staging: Stage 2 - Partial thickness loss of dermis presenting as a shallow open injury with a red, pink wound bed without slough.  Wound Description (Comments):   Present on Admission:   #12right-sided  infiltrate with concern  for aspiration pneumonia -Finish 7-day course of treatment with Augmentin for 2 more days. -Continue Mucinex for chest congestion   Discharge Diagnoses:  Principal Problem:   Small bowel obstruction ([place]) Active Problems:   Hyperthyroidism   Essential hypertension   CAD S/P percutaneous coronary angioplasty   Atrial fibrillation, chronic (HCC)   AKI (acute kidney injury) (HCC)   BPH (benign prostatic hyperplasia)   GERD without esophagitis   Anticoagulated   Candidal UTI (urinary tract infection)   Ventral hernia with bowel obstruction    Discharge Instructions  Discharge Instructions    Change dressing (specify)   Complete by: As directed    Dressing change: daily with dry gauze and paper tape over the wound.   Diet - low sodium heart healthy   Complete by: As directed    Increase activity slowly   Complete by: As directed      Allergies as of 07/04/2019      Reactions   Indomethacin Anaphylaxis   But tolerates ibuprofen, aleve   Iodine-131 Anaphylaxis      Medication List    STOP taking these medications   digoxin 0.125 MG tablet Commonly known as: LANOXIN   diltiazem 180 MG 24 hr capsule Commonly known as: CARDIZEM CD     TAKE these medications   amiodarone 200 MG tablet Commonly known as: PACERONE Take 1 tablet (200 mg total) by mouth daily.   amoxicillin-clavulanate 875-125 MG tablet Commonly known as: AUGMENTIN Take 1 tablet by mouth every 12 (twelve) hours for 2 days.   apixaban 5 MG Tabs tablet Commonly known as: ELIQUIS Take 1 tablet (5 mg total) by mouth 2 (two) times daily.   bisacodyl 10 MG suppository Commonly known as: DULCOLAX Place 1 suppository (10 mg total) rectally daily as needed for severe constipation.   Coenzyme Q10 200 MG capsule Take 200 mg by mouth daily.   docusate sodium 100 MG capsule Commonly known as: COLACE Take 1 capsule (100 mg total) by mouth 2 (two) times daily.   Ensure Clear Liqd Take 237 mLs by mouth  daily.   finasteride 5 MG tablet Commonly known as: PROSCAR Take 1 tablet (5 mg total) by mouth daily.   furosemide 20 MG tablet Commonly known as: LASIX Take 2 tablets (40 mg total) by mouth daily.   guaiFENesin 600 MG 12 hr tablet Commonly known as: MUCINEX Take 1 tablet (600 mg total) by mouth 2 (two) times daily for 10 days.   methimazole 10 MG tablet Commonly known as: TAPAZOLE Take 2 tablets (20 mg total) by mouth 2 (two) times daily.   methocarbamol 750 MG tablet Commonly known as: ROBAXIN Take 1 tablet (750 mg total) by mouth every 8 (eight) hours as needed for muscle spasms.   metoprolol tartrate 100 MG tablet Commonly known as: LOPRESSOR Take 0.5 tablets (50 mg total) by mouth 2 (two) times daily.   multivitamin tablet Take 1 tablet by mouth daily.   nitroGLYCERIN 0.4 MG SL tablet Commonly known as: NITROSTAT Place 1 tablet (0.4 mg total) under the tongue every 5 (five) minutes as needed.   ofloxacin 0.3 % ophthalmic solution Commonly known as: OCUFLOX Place 1 drop into both eyes 4 (four) times daily.   ondansetron 4 MG tablet Commonly known as: ZOFRAN Take 1 tablet (4 mg total) by mouth every 6 (six) hours as needed for nausea or vomiting.   oxyCODONE 5 MG immediate release tablet Commonly known as: Oxy IR/ROXICODONE Take 1 tablet (  5 mg total) by mouth every 6 (six) hours as needed for moderate pain, severe pain or breakthrough pain.   pantoprazole 40 MG tablet Commonly known as: PROTONIX Take 40 mg by mouth daily.   polyethylene glycol 17 g packet Commonly known as: MIRALAX / GLYCOLAX Take 17 g by mouth daily as needed for mild constipation.   potassium chloride 10 MEQ tablet Commonly known as: KLOR-CON Take 1 tablet (10 mEq total) by mouth every evening.   pravastatin 10 MG tablet Commonly known as: PRAVACHOL Take 1 tablet (10 mg total) by mouth daily at 6 PM.   promethazine 25 MG tablet Commonly known as: PHENERGAN Take 25 mg by mouth every  6 (six) hours as needed for nausea or vomiting.   senna-docusate 8.6-50 MG tablet Commonly known as: Senokot-S Take 1 tablet by mouth 2 (two) times daily. What changed:   when to take this  reasons to take this   tamsulosin 0.4 MG Caps capsule Commonly known as: FLOMAX Take 1 capsule (0.4 mg total) by mouth daily after breakfast.            Discharge Care Instructions  (From admission, onward)         Start     Ordered   07/04/19 0000  Change dressing (specify)       Comments: Dressing change: daily with dry gauze and paper tape over the wound.   07/04/19 7782          Contact information for follow-up providers    Aviva Signs, MD Follow up on 07/12/2019.   Specialty: General Surgery Why: staple removal after hernia repair, Dr. Constance Haw is on vacation that week when you need the staples out.  Contact information: [address] [address] [phone]            Contact information for after-discharge care    [facility] .   Service: Skilled Nursing Contact information: [address] [address] [phone]                 Allergies  Allergen Reactions  . Indomethacin Anaphylaxis    But tolerates ibuprofen, aleve  . Iodine-131 Anaphylaxis    Consultations:  General surgery  Cardiology  GI   Procedures/Studies: CT ABDOMEN PELVIS WO CONTRAST  Result Date: 06/25/2019 CLINICAL DATA:  Feculent vomiting for 2 days, low-grade fever, cramping, lower abdominal pain, history hypertension, coronary artery disease post MI EXAM: CT ABDOMEN AND PELVIS WITHOUT CONTRAST TECHNIQUE: Multidetector CT imaging of the abdomen and pelvis was performed following the standard protocol without IV contrast. Sagittal and coronal MPR images reconstructed from axial data set. No oral contrast was administered. COMPARISON:  02/12/2019 FINDINGS: Lower chest: Mild bibasilar  atelectasis Hepatobiliary: Gallbladder surgically absent.  Liver unremarkable. Pancreas: Normal appearance Spleen: Normal appearance Adrenals/Urinary Tract: Small peripelvic cysts LEFT kidney. Tiny nonobstructing LEFT renal calculus. Adrenal glands, kidneys, ureters, and bladder normal appearance Stomach/Bowel: Appendix not visualized, no pericecal inflammatory process seen. Colon decompressed. Stomach unremarkable. Dilated proximal and decompressed distal small bowel loops consistent with small bowel obstruction. Obstruction is secondary to a small bowel loop within a LEFT infra umbilical hernia. Mild bowel wall thickening of the associated small bowel loop. Associated diffuse stranding of the small bowel mesentery increased from prior study. Distal small bowel loops unremarkable. Vascular/Lymphatic: Atherosclerotic calcifications aorta, iliac arteries, coronary arteries. Aorta normal caliber. No adenopathy. Reproductive: Foley catheter balloon is inflated within the inferior prostate  gland, recommend deflating and advancing. Mild prostatic enlargement gland measuring 5.3 x 5.3 x 6.4 cm (volume = 94 cm^3). Seminal vesicles unremarkable. Other: In addition to LEFT infraumbilical hernia containing obstructed small bowel loop, a small LEFT supraumbilical ventral hernia is seen containing fat. No free air or free fluid. Musculoskeletal: Diffuse osseous demineralization. Mild degenerative changes of the thoracolumbar spine. IMPRESSION: Mid small bowel obstruction secondary to herniation of a small bowel loop into a LEFT infraumbilical ventral hernia, associated with mild bowel wall thickening but no evidence of perforation. Additional small LEFT supraumbilical ventral hernia containing fat. Foley catheter balloon is inflated within the inferior prostate gland; recommend replacement. Prostatic enlargement. Atherosclerotic calcifications including coronary arteries. Aortic Atherosclerosis (ICD10-I70.0). Findings called to  Dr. Roderic Palau On 06/25/2019 at 1723 hrs. Electronically Signed   By: Lavonia Dana M.D.   On: 06/25/2019 17:25   DG Chest 1 View  Result Date: 07/02/2019 CLINICAL DATA:  Chest congestion. EXAM: CHEST  1 VIEW COMPARISON:  06/30/2019; 06/25/2019 FINDINGS: Grossly unchanged borderline enlarged cardiac silhouette and mediastinal contours with atherosclerotic plaque within the aortic arch. Mild pulmonary venous congestion without frank evidence of edema. Previously questioned right lower lung nodular opacities are less conspicuous on the present examination. No discrete focal airspace opacities. No pleural effusion or pneumothorax. No acute osseous abnormalities. IMPRESSION: 1. Similar findings of borderline cardiomegaly and pulmonary venous congestion without definitive superimposed acute cardiopulmonary disease. 2. Questioned right lower lung nodular opacities are less conspicuous on present examination. Electronically Signed   By: Sandi Mariscal M.D.   On: 07/02/2019 06:11   DG Abd 1 View  Result Date: 06/27/2019 CLINICAL DATA:  Small-bowel obstruction, NGT removal yesterday EXAM: ABDOMEN - 1 VIEW COMPARISON:  Radiograph 06/26/2019, CT 06/25/2019 FINDINGS: Limited assessment for free air on supine radiography. There are several air filled loops of small bowel in the mid abdomen which are upper limits normal caliber though overall degree of distention is decreased from 1 day prior. Few air-filled loops of colon noted as well, nonspecific. Cholecystectomy clips in the right upper quadrant. No suspicious calcifications. Extensive degenerative changes in the spine, hips and pelvis are unchanged from comparison. No acute osseous abnormality. IMPRESSION: 1. Limited assessment for free air on supine radiography. 2. Several air filled loops of small bowel in the mid abdomen which are upper limits normal caliber though overall degree of distention is decreased from 1 day prior. Findings may reflect resolving small bowel  obstruction or ileus. Electronically Signed   By: Lovena Le M.D.   On: 06/27/2019 05:16   DG Abd 1 View  Result Date: 06/26/2019 CLINICAL DATA:  Abdominal pain, small-bowel obstruction EXAM: ABDOMEN - 1 VIEW COMPARISON:  CT abdomen pelvis, 06/25/2019 FINDINGS: Single supine abdominal radiograph excludes the upper abdomen and portions of the left and right lateral abdomen. Within this limitation, gas-filled, nondistended loops of small bowel in the included central abdomen, with scattered gas present in the portions of the included colon to the rectum. IMPRESSION: Single supine abdominal radiograph excludes the upper abdomen and portions of the left and right lateral abdomen. Within this limitation, gas-filled, nondistended loops of small bowel in the included central abdomen, with scattered gas present in the portions of the included colon to the rectum. This examination suggests significant resolution of previously noted small bowel obstruction. Electronically Signed   By: Eddie Candle M.D.   On: 06/26/2019 12:41   DG CHEST PORT 1 VIEW  Result Date: 06/30/2019 CLINICAL DATA:  Cough. EXAM: PORTABLE CHEST 1  VIEW COMPARISON:  June 25, 2019 FINDINGS: The enteric tube extends below the left hemidiaphragm. The heart size is stable. Aortic calcifications are noted. There is no significant pleural effusion. There is a possible developing infiltrate in the medial right lower lung zone. There are old healed left-sided rib fractures. There is no acute displaced fracture. IMPRESSION: Possible new infiltrate in the medial right lower lung zone. Electronically Signed   By: Constance Holster M.D.   On: 06/30/2019 03:47   DG Chest Portable 1 View  Result Date: 06/25/2019 CLINICAL DATA:  Advancement of nasogastric tube. EXAM: PORTABLE CHEST 1 VIEW COMPARISON:  Earlier this day. FINDINGS: Tip of the enteric tube is below the diaphragm in the stomach in the left upper quadrant. The side-port is not well visualized, but  likely below the diaphragm. Low lung volumes. Cardiomegaly is unchanged. No focal airspace disease. IMPRESSION: Tip of the enteric tube below the diaphragm in the stomach in the left upper quadrant. Electronically Signed   By: Keith Rake M.D.   On: 06/25/2019 21:22   DG Chest Portable 1 View  Result Date: 06/25/2019 CLINICAL DATA:  NGT placement EXAM: PORTABLE CHEST 1 VIEW COMPARISON:  04/11/2019 FINDINGS: Cardiomegaly. Esophagogastric tube is positioned with tip over the lower esophagus, although poorly visualized due to underpenetration. Both lungs are clear. The visualized skeletal structures are unremarkable. IMPRESSION: Esophagogastric tube is positioned with tip over the lower esophagus, although poorly visualized due to underpenetration. Recommend advancement to ensure subdiaphragmatic positioning of tip and side port. Electronically Signed   By: Eddie Candle M.D.   On: 06/25/2019 20:15    Discharge Exam: Vitals:   07/03/19 2205 07/04/19 0636  BP:  (!) 168/57  Pulse: 63 (!) 59  Resp:  15  Temp:  98.6 F (37 C)  SpO2:  97%   Vitals:   07/03/19 2030 07/03/19 2112 07/03/19 2205 07/04/19 0636  BP:  (!) 160/58  (!) 168/57  Pulse:  (!) 59 63 (!) 59  Resp:  20  15  Temp:  98.4 F (36.9 C)  98.6 F (37 C)  TempSrc:  Oral  Oral  SpO2: 95% 100%  97%  Weight:      Height:        General: Pt is alert, awake, not in acute distress Cardiovascular: RRR, S1/S2 +, no rubs, no gallops Respiratory: CTA bilaterally, no wheezing, no rhonchi Abdominal: Incision clean dry and intact. Extremities: no edema, no cyanosis    The results of significant diagnostics from this hospitalization (including imaging, microbiology, ancillary and laboratory) are listed below for reference.     Microbiology: Recent Results (from the past 240 hour(s))  SARS Coronavirus 2 by RT PCR (hospital order, performed in [facility] hospital lab) Nasopharyngeal Nasopharyngeal Swab     Status: None   Collection  Time: 06/25/19  5:32 PM   Specimen: Nasopharyngeal Swab  Result Value Ref Range Status   SARS Coronavirus 2 NEGATIVE NEGATIVE Final    Comment: (NOTE) SARS-CoV-2 target nucleic acids are NOT DETECTED. The SARS-CoV-2 RNA is generally detectable in upper and lower respiratory specimens during the acute phase of infection. The lowest concentration of SARS-CoV-2 viral copies this assay can detect is 250 copies / mL. A negative result does not preclude SARS-CoV-2 infection and should not be used as the sole basis for treatment or other patient management decisions.  A negative result may occur with improper specimen collection / handling, submission of specimen other than nasopharyngeal swab, presence of viral mutation(s) within  the areas targeted by this assay, and inadequate number of viral copies (<250 copies / mL). A negative result must be combined with clinical observations, patient history, and epidemiological information. Fact Sheet for Patients:   StrictlyIdeas.no Fact Sheet for Healthcare Providers: BankingDealers.co.za This test is not yet approved or cleared  by the [place] FDA and has been authorized for detection and/or diagnosis of SARS-CoV-2 by FDA under an Emergency Use Authorization (EUA).  This EUA will remain in effect (meaning this test can be used) for the duration of the COVID-19 declaration under Section 564(b)(1) of the Act, 21 U.S.C. section 360bbb-3(b)(1), unless the authorization is terminated or revoked sooner. Performed at [facility], [address], [place], [place] [address]   Urine culture     Status: Abnormal   Collection Time: 06/25/19  5:59 PM   Specimen: Urine, Catheterized  Result Value Ref Range Status   Specimen Description   Final    URINE, CATHETERIZED Performed at [facility], [address] [place], [place] [address]    Special Requests   Final    NONE Performed at [facility],  [address], [place], [place] [address]    Culture >=100,000 COLONIES/mL PROTEUS MIRABILIS (A)  Final   Report Status 06/28/2019 FINAL  Final   Organism ID, Bacteria PROTEUS MIRABILIS (A)  Final      Susceptibility   Proteus mirabilis - MIC*    AMPICILLIN <=2 SENSITIVE Sensitive     CEFAZOLIN <=4 SENSITIVE Sensitive     CEFTRIAXONE <=1 SENSITIVE Sensitive     CIPROFLOXACIN 0.5 SENSITIVE Sensitive     GENTAMICIN <=1 SENSITIVE Sensitive     IMIPENEM 4 SENSITIVE Sensitive     NITROFURANTOIN 256 RESISTANT Resistant     TRIMETH/SULFA <=20 SENSITIVE Sensitive     AMPICILLIN/SULBACTAM <=2 SENSITIVE Sensitive     PIP/TAZO <=4 SENSITIVE Sensitive     * >=100,000 COLONIES/mL PROTEUS MIRABILIS  Surgical pcr screen     Status: None   Collection Time: 06/27/19  8:39 PM   Specimen: Nasal Mucosa; Nasal Swab  Result Value Ref Range Status   MRSA, PCR NEGATIVE NEGATIVE Final   Staphylococcus aureus NEGATIVE NEGATIVE Final    Comment: (NOTE) The Xpert SA Assay (FDA approved for NASAL specimens in patients 42 years of age and older), is one component of a comprehensive surveillance program. It is not intended to diagnose infection nor to guide or monitor treatment. Performed at [facility], [address], [place], [place] [address]   MRSA PCR Screening     Status: None   Collection Time: 06/30/19  4:16 AM   Specimen: Nasopharyngeal  Result Value Ref Range Status   MRSA by PCR NEGATIVE NEGATIVE Final    Comment:        The GeneXpert MRSA Assay (FDA approved for NASAL specimens only), is one component of a comprehensive MRSA colonization surveillance program. It is not intended to diagnose MRSA infection nor to guide or monitor treatment for MRSA infections. Performed at [facility], [address], [place], [place] [address]   Culture, sputum-assessment     Status: None   Collection Time: 06/30/19  8:05 AM   Specimen: Sputum  Result Value Ref Range Status   Specimen Description SPUTUM   Final   Special Requests Immunocompromised  Final   Sputum evaluation   Final    Sputum specimen not acceptable for testing.  Please recollect.   NOTIFIED Cristopher Peru @1002  06/30/2019 BY JONES,T Performed at [facility],  [place], [place] [address]    Report Status 06/30/2019 FINAL  Final     Labs: BNP (last 3 results) Recent Labs    03/03/19 0737 04/11/19 1651 06/30/19 0348  BNP 345.4* 243.7* 678.9*   Basic Metabolic Panel: Recent Labs  Lab 06/29/19 0353 06/30/19 0348 06/30/19 0837 07/01/19 0601 07/02/19 0603 07/03/19 0638 07/04/19 0520  NA   < >  --  140 139 140 138 140  K   < >  --  3.4* 3.1* 3.4* 3.0* 3.8  CL   < >  --  109 105 106 107 109  CO2   < >  --  23 25 24 25 24   GLUCOSE   < >  --  97 99 114* 102* 100*  BUN   < >  --  11 13 13 11 10   CREATININE   < >  --  1.08 1.37* 1.22 1.15 1.05  CALCIUM   < >  --  7.8* 7.7* 8.0* 7.9* 7.9*  MG  --  1.8  --  1.8 1.8 1.8 1.8   < > = values in this interval not displayed.   Liver Function Tests: Recent Labs  Lab 06/28/19 0510 06/29/19 0353  AST 12* 12*  ALT 15 13  ALKPHOS 76 74  BILITOT 0.4 0.6  PROT 5.2* 5.7*  ALBUMIN 2.8* 2.8*   No results for input(s): LIPASE, AMYLASE in the last 168 hours. No results for input(s): AMMONIA in the last 168 hours. CBC: Recent Labs  Lab 06/29/19 0353 06/30/19 0837 07/01/19 0601 07/02/19 0603 07/04/19 0520  WBC 10.4 9.5 8.1 7.4 6.1  HGB 10.4* 9.8* 8.8* 8.9* 8.4*  HCT 31.9* 30.4* 27.4* 28.1* 27.0*  MCV 87.2 87.4 88.1 88.1 88.2  PLT 202 190 199 197 206   Cardiac Enzymes: No results for input(s): CKTOTAL, CKMB, CKMBINDEX, TROPONINI in the last 168 hours. BNP: Invalid input(s): POCBNP CBG: No results for input(s): GLUCAP in the last 168 hours. D-Dimer No results for input(s): DDIMER in the last 72 hours. Hgb A1c No results for input(s): HGBA1C in the last 72 hours. Lipid Profile No results for input(s): CHOL, HDL, LDLCALC, TRIG, CHOLHDL, LDLDIRECT in the  last 72 hours. Thyroid function studies No results for input(s): TSH, T4TOTAL, T3FREE, THYROIDAB in the last 72 hours.  Invalid input(s): FREET3 Anemia work up No results for input(s): VITAMINB12, FOLATE, FERRITIN, TIBC, IRON, RETICCTPCT in the last 72 hours. Urinalysis    Component Value Date/Time   COLORURINE AMBER (A) 06/25/2019 1607   APPEARANCEUR TURBID (A) 06/25/2019 1607   LABSPEC 1.017 06/25/2019 1607   PHURINE 8.0 06/25/2019 1607   GLUCOSEU NEGATIVE 06/25/2019 1607   HGBUR NEGATIVE 06/25/2019 1607   BILIRUBINUR NEGATIVE 06/25/2019 1607   KETONESUR 5 (A) 06/25/2019 1607   PROTEINUR 100 (A) 06/25/2019 1607   NITRITE NEGATIVE 06/25/2019 1607   LEUKOCYTESUR LARGE (A) 06/25/2019 1607   Sepsis Labs Invalid input(s): PROCALCITONIN,  WBC,  LACTICIDVEN Microbiology Recent Results (from the past 240 hour(s))  SARS Coronavirus 2 by RT PCR (hospital order, performed in [facility] hospital lab) Nasopharyngeal Nasopharyngeal Swab     Status: None   Collection Time: 06/25/19  5:32 PM   Specimen: Nasopharyngeal Swab  Result Value Ref Range Status   SARS Coronavirus 2 NEGATIVE NEGATIVE Final    Comment: (NOTE) SARS-CoV-2 target nucleic acids are NOT DETECTED. The SARS-CoV-2 RNA is generally detectable in upper and lower respiratory specimens during the acute phase of infection. The lowest  concentration of SARS-CoV-2 viral copies this assay can detect is 250 copies / mL. A negative result does not preclude SARS-CoV-2 infection and should not be used as the sole basis for treatment or other patient management decisions.  A negative result may occur with improper specimen collection / handling, submission of specimen other than nasopharyngeal swab, presence of viral mutation(s) within the areas targeted by this assay, and inadequate number of viral copies (<250 copies / mL). A negative result must be combined with clinical observations, patient history, and epidemiological  information. Fact Sheet for Patients:   StrictlyIdeas.no Fact Sheet for Healthcare Providers: BankingDealers.co.za This test is not yet approved or cleared  by the [place] FDA and has been authorized for detection and/or diagnosis of SARS-CoV-2 by FDA under an Emergency Use Authorization (EUA).  This EUA will remain in effect (meaning this test can be used) for the duration of the COVID-19 declaration under Section 564(b)(1) of the Act, 21 U.S.C. section 360bbb-3(b)(1), unless the authorization is terminated or revoked sooner. Performed at [facility], [address] [place], [place] [address]   Urine culture     Status: Abnormal   Collection Time: 06/25/19  5:59 PM   Specimen: Urine, Catheterized  Result Value Ref Range Status   Specimen Description   Final    URINE, CATHETERIZED Performed at [facility], [address] [place], [place] [address]    Special Requests   Final    NONE Performed at [facility], [address] [place], [place] [address]    Culture >=100,000 COLONIES/mL PROTEUS MIRABILIS (A)  Final   Report Status 06/28/2019 FINAL  Final   Organism ID, Bacteria PROTEUS MIRABILIS (A)  Final      Susceptibility   Proteus mirabilis - MIC*    AMPICILLIN <=2 SENSITIVE Sensitive     CEFAZOLIN <=4 SENSITIVE Sensitive     CEFTRIAXONE <=1 SENSITIVE Sensitive     CIPROFLOXACIN 0.5 SENSITIVE Sensitive     GENTAMICIN <=1 SENSITIVE Sensitive     IMIPENEM 4 SENSITIVE Sensitive     NITROFURANTOIN 256 RESISTANT Resistant     TRIMETH/SULFA <=20 SENSITIVE Sensitive     AMPICILLIN/SULBACTAM <=2 SENSITIVE Sensitive     PIP/TAZO <=4 SENSITIVE Sensitive     * >=100,000 COLONIES/mL PROTEUS MIRABILIS  Surgical pcr screen     Status: None   Collection Time: 06/27/19  8:39 PM   Specimen: Nasal Mucosa; Nasal Swab  Result Value Ref Range Status   MRSA, PCR NEGATIVE NEGATIVE Final   Staphylococcus aureus NEGATIVE NEGATIVE Final     Comment: (NOTE) The Xpert SA Assay (FDA approved for NASAL specimens in patients 53 years of age and older), is one component of a comprehensive surveillance program. It is not intended to diagnose infection nor to guide or monitor treatment. Performed at [facility], [address] [place], [place] [address]   MRSA PCR Screening     Status: None   Collection Time: 06/30/19  4:16 AM   Specimen: Nasopharyngeal  Result Value Ref Range Status   MRSA by PCR NEGATIVE NEGATIVE Final    Comment:        The GeneXpert MRSA Assay (FDA approved for NASAL specimens only), is one component of a comprehensive MRSA colonization surveillance program. It is not intended to diagnose MRSA infection nor to guide or monitor treatment for MRSA infections. Performed at [facility], [address], [place], [place] [address]   Culture, sputum-assessment     Status: None   Collection  Time: 06/30/19  8:05 AM   Specimen: Sputum  Result Value Ref Range Status   Specimen Description SPUTUM  Final   Special Requests Immunocompromised  Final   Sputum evaluation   Final    Sputum specimen not acceptable for testing.  Please recollect.   NOTIFIED Hassell Done, D @1002  06/30/2019 BY JONES,T Performed at [facility], [address] [place], [place] [address]    Report Status 06/30/2019 FINAL  Final     Time coordinating discharge: 35 minutes  SIGNED:   Rodena Goldmann, DO [facility] 07/04/2019, 9:58 AM  If 7PM-7AM, please contact night-coverage [email or web address]

## 2019-07-04 NOTE — Progress Notes (Signed)
Report called to the [facility]. 

## 2019-07-04 NOTE — TOC Transition Note (Signed)
Transition of Care [facility]) - CM/SW Discharge Note   Patient Details  Name: ZACHERY NISWANDER MRN: [id number] Date of Birth: 19-Sep-1935  [facility] CM/SW Contact:  Ihor Gully, LCSW Phone Number: 07/04/2019, 1:11 PM   Clinical Narrative:    Marianna Fuss at [facility] notified of discharge. Discharge clinicals sent to facility. RN to call report.    Final next level of care: Skilled Nursing Facility Barriers to Discharge: Continued Medical Work up   Patient Goals and CMS Choice Patient states their goals for this hospitalization and ongoing recovery are:: Be able to return home CMS Medicare.gov Compare Post Acute Care list provided to:: Patient Choice offered to / list presented to : Patient  Discharge Placement   Existing PASRR number confirmed : 07/01/19          Patient chooses bed at: [facility] Patient to be transferred to facility by: staff Name of family member notified: message left for daughter, cheryl    Discharge Plan and Services In-house Referral: Clinical Social Work   Post Acute Care Choice: [place]                               Social Determinants of Health ([place]) Interventions     Readmission Risk Interventions Readmission Risk Prevention Plan 06/30/2019 03/11/2019  Transportation Screening Complete Complete  PCP or Specialist Appt within 3-5 Days Not Complete Not Complete  Not Complete comments SNF is expected discharge plan plan for SNF  HRI or Home Care Consult Complete Not Complete  HRI or Home Care Consult comments - plan for SNF  Social Work Consult for [place] Planning/Counseling Complete Complete  Palliative Care Screening Not Applicable Complete  Medication Review (RN Care Manager) Complete Referral to Pharmacy  Some recent data might be hidden

## 2019-07-04 NOTE — Care Management Important Message (Signed)
Important Message  Patient Details  Name: Derek Foster MRN: [id number] Date of Birth: 1935-07-03   Medicare Important Message Given:  Yes     Tommy Medal 07/04/2019, 1:53 PM

## 2019-07-04 NOTE — Progress Notes (Signed)
[facility]  Doing well. Needs staples out next week. Dr. Arnoldo Morale may be able to do this at the [facility] if he goes there. Office will verify, and if able Dr. Arnoldo Morale can just see him at [facility] center and remove staples.  Curlene Labrum, MD [facility] [address] [place], [place] [address] [phone] (office)

## 2019-07-04 NOTE — Progress Notes (Signed)
Patient for discharge to [facility] today. Still has mild cough but no cardiac complaints. Lasix held last week. Would resume at discharge-40 mg daily and K dur 10 meq daily. Crt stable 1.8. f/u with Dr. Oval Linsey in 2-3 weeks-will arrange. Hgb down to 8.4 per primary team.

## 2019-07-05 ENCOUNTER — Encounter: Payer: Self-pay | Admitting: Adult Health

## 2019-07-05 ENCOUNTER — Non-Acute Institutional Stay (SKILLED_NURSING_FACILITY): Payer: PPO | Admitting: Adult Health

## 2019-07-05 DIAGNOSIS — I480 Paroxysmal atrial fibrillation: Secondary | ICD-10-CM | POA: Diagnosis not present

## 2019-07-05 DIAGNOSIS — I10 Essential (primary) hypertension: Secondary | ICD-10-CM | POA: Diagnosis not present

## 2019-07-05 DIAGNOSIS — N179 Acute kidney failure, unspecified: Secondary | ICD-10-CM

## 2019-07-05 DIAGNOSIS — K436 Other and unspecified ventral hernia with obstruction, without gangrene: Secondary | ICD-10-CM

## 2019-07-05 DIAGNOSIS — E0590 Thyrotoxicosis, unspecified without thyrotoxic crisis or storm: Secondary | ICD-10-CM | POA: Diagnosis not present

## 2019-07-05 DIAGNOSIS — E43 Unspecified severe protein-calorie malnutrition: Secondary | ICD-10-CM | POA: Diagnosis not present

## 2019-07-05 DIAGNOSIS — N138 Other obstructive and reflux uropathy: Secondary | ICD-10-CM

## 2019-07-05 DIAGNOSIS — Z9861 Coronary angioplasty status: Secondary | ICD-10-CM

## 2019-07-05 DIAGNOSIS — T83511D Infection and inflammatory reaction due to indwelling urethral catheter, subsequent encounter: Secondary | ICD-10-CM

## 2019-07-05 DIAGNOSIS — N401 Enlarged prostate with lower urinary tract symptoms: Secondary | ICD-10-CM

## 2019-07-05 DIAGNOSIS — N390 Urinary tract infection, site not specified: Secondary | ICD-10-CM

## 2019-07-05 DIAGNOSIS — J690 Pneumonitis due to inhalation of food and vomit: Secondary | ICD-10-CM

## 2019-07-05 DIAGNOSIS — K56609 Unspecified intestinal obstruction, unspecified as to partial versus complete obstruction: Secondary | ICD-10-CM

## 2019-07-05 DIAGNOSIS — I2510 Atherosclerotic heart disease of native coronary artery without angina pectoris: Secondary | ICD-10-CM

## 2019-07-05 DIAGNOSIS — R600 Localized edema: Secondary | ICD-10-CM

## 2019-07-05 DIAGNOSIS — K219 Gastro-esophageal reflux disease without esophagitis: Secondary | ICD-10-CM

## 2019-07-05 DIAGNOSIS — E876 Hypokalemia: Secondary | ICD-10-CM

## 2019-07-05 NOTE — Progress Notes (Signed)
Location:    [facility] Room Number: 156/W Place of Service:  SNF (31)   CODE STATUS: DNR  Allergies  Allergen Reactions  . Indomethacin Anaphylaxis    But tolerates ibuprofen, aleve  . Iodine-131 Anaphylaxis    Chief Complaint  Patient presents with  . Hospitalization Follow-up    Hospitalization Follow Up    HPI:  He is a 64 yea rold man who has been hospitalized from 06-25-19 through 07-04-19. He has been hospitalized for a small bowel obstruction due to ventral hernia. He had coffee ground emesis  He had a small bowel resection; lysis of adhesions; ventral hernia repair with mesh on 06-28-19. He is here for short term rehab. His goal is to return back home. He denies any uncontrolled pain; no constipation or diarrhea. He denies any nausea. He will continue to be followed for his chronic illnesses including: gerd; hypertension; cad.   Past Medical History:  Diagnosis Date  . CAD (coronary artery disease)    a. cath 01/27/2009 : s/p promus DES to LAD and medical managment of 70-80% mRCA  . COVID-19   . Essential hypertension   . Hypothyroidism   . Mixed hyperlipidemia   . NSTEMI (non-ST elevated myocardial infarction) ([place]) 2011  . PAF (paroxysmal atrial fibrillation) ([place])     Past Surgical History:  Procedure Laterality Date  . BOWEL RESECTION N/A 06/28/2019   Procedure: SMALL BOWEL RESECTION;  Surgeon: Virl Cagey, MD;  Location: [facility];  Service: General;  Laterality: N/A;  . CHOLECYSTECTOMY    . CORONARY STENT PLACEMENT    . CYSTOSCOPY/RETROGRADE/URETEROSCOPY Bilateral 03/09/2019   Procedure: CYSTOSCOPY, Clot Evacuation, Fulguration;  Surgeon: Festus Aloe, MD;  Location: [place];  Service: Urology;  Laterality: Bilateral;  . HERNIA REPAIR    . LYSIS OF ADHESION N/A 06/28/2019   Procedure: LYSIS OF ADHESION;  Surgeon: Virl Cagey, MD;  Location: [facility];  Service: General;  Laterality: N/A;  . VENTRAL HERNIA REPAIR N/A 06/28/2019    Procedure: HERNIA REPAIR VENTRAL ADULT WITH VICRYL MESH OVERLAY;  Surgeon: Virl Cagey, MD;  Location: [facility];  Service: General;  Laterality: N/A;    Social History   Socioeconomic History  . Marital status: Married    Spouse name: Not on file  . Number of children: Not on file  . Years of education: Not on file  . Highest education level: Not on file  Occupational History  . Occupation: Charity fundraiser  Tobacco Use  . Smoking status: Former Smoker    Quit date: 01/20/1957    Years since quitting: 62.4  . Smokeless tobacco: Never Used  Vaping Use  . Vaping Use: Never used  Substance and Sexual Activity  . Alcohol use: No  . Drug use: Never  . Sexual activity: Not on file  Other Topics Concern  . Not on file  Social History Narrative   Quit smoking about 62 years ago.   Social Determinants of Health   Financial Resource Strain:   . Difficulty of Paying Living Expenses:   Food Insecurity:   . Worried About Charity fundraiser in the Last Year:   . Arboriculturist in the Last Year:   Transportation Needs:   . Film/video editor (Medical):   Marland Kitchen Lack of Transportation (Non-Medical):   Physical Activity:   . Days of Exercise per Week:   . Minutes of Exercise per Session:   Stress:   . Feeling of Stress :  Social Connections:   . Frequency of Communication with Friends and Family:   . Frequency of Social Gatherings with Friends and Family:   . Attends Religious Services:   . Active Member of Clubs or Organizations:   . Attends Archivist Meetings:   Marland Kitchen Marital Status:   Intimate Partner Violence:   . Fear of Current or Ex-Partner:   . Emotionally Abused:   Marland Kitchen Physically Abused:   . Sexually Abused:    Family History  Problem Relation Age of Onset  . Heart attack Father   . Lung cancer Sister       VITAL SIGNS BP (!) 152/65   Pulse 60   Temp (!) 97.5 F (36.4 C) (Oral)   Resp 20   Ht 6' (1.829 m)   Wt 271 lb (122.9 kg)   SpO2 97%   BMI  36.75 kg/m   Outpatient Encounter Medications as of 07/05/2019  Medication Sig  . amiodarone (PACERONE) 200 MG tablet Take 1 tablet (200 mg total) by mouth daily.  Marland Kitchen amoxicillin-clavulanate (AUGMENTIN) 875-125 MG tablet Take 1 tablet by mouth every 12 (twelve) hours for 2 days.  Marland Kitchen apixaban (ELIQUIS) 5 MG TABS tablet Take 1 tablet (5 mg total) by mouth 2 (two) times daily.  . bisacodyl (DULCOLAX) 10 MG suppository Place 1 suppository (10 mg total) rectally daily as needed for severe constipation.  . Coenzyme Q10 200 MG capsule Take 200 mg by mouth daily.    Marland Kitchen docusate sodium (COLACE) 100 MG capsule Take 1 capsule (100 mg total) by mouth 2 (two) times daily.  . finasteride (PROSCAR) 5 MG tablet Take 1 tablet (5 mg total) by mouth daily.  . furosemide (LASIX) 20 MG tablet Take 2 tablets (40 mg total) by mouth daily.  Marland Kitchen guaiFENesin (MUCINEX) 600 MG 12 hr tablet Take 1 tablet (600 mg total) by mouth 2 (two) times daily for 10 days.  . methimazole (TAPAZOLE) 10 MG tablet Take 2 tablets (20 mg total) by mouth 2 (two) times daily.  . methocarbamol (ROBAXIN) 750 MG tablet Take 1 tablet (750 mg total) by mouth every 8 (eight) hours as needed for muscle spasms.  . metoprolol tartrate (LOPRESSOR) 100 MG tablet Take 0.5 tablets (50 mg total) by mouth 2 (two) times daily.  . Multiple Vitamin (MULTIVITAMIN) tablet Take 1 tablet by mouth daily.    . nitroGLYCERIN (NITROSTAT) 0.4 MG SL tablet Place 1 tablet (0.4 mg total) under the tongue every 5 (five) minutes as needed.  . NON FORMULARY Diet - NAS  . Nutritional Supplements (ENSURE CLEAR) LIQD Take 237 mLs by mouth daily.  Marland Kitchen ofloxacin (OCUFLOX) 0.3 % ophthalmic solution Place 1 drop into both eyes 4 (four) times daily.   . ondansetron (ZOFRAN) 4 MG tablet Take 1 tablet (4 mg total) by mouth every 6 (six) hours as needed for nausea or vomiting.  Marland Kitchen oxyCODONE (OXY IR/ROXICODONE) 5 MG immediate release tablet Take 1 tablet (5 mg total) by mouth every 6 (six)  hours as needed for up to 7 days for moderate pain, severe pain or breakthrough pain.  . pantoprazole (PROTONIX) 40 MG tablet Take 40 mg by mouth daily.  . polyethylene glycol (MIRALAX / GLYCOLAX) 17 g packet Take 17 g by mouth daily as needed for mild constipation.  . potassium chloride (KLOR-CON) 10 MEQ tablet Take 1 tablet (10 mEq total) by mouth every evening.  . pravastatin (PRAVACHOL) 10 MG tablet Take 1 tablet (10 mg total) by mouth daily at  6 PM.  . senna-docusate (SENOKOT-S) 8.6-50 MG tablet Take 1 tablet by mouth 2 (two) times daily.  . tamsulosin (FLOMAX) 0.4 MG CAPS capsule Take 1 capsule (0.4 mg total) by mouth daily after breakfast.  . [DISCONTINUED] promethazine (PHENERGAN) 25 MG tablet Take 25 mg by mouth every 6 (six) hours as needed for nausea or vomiting.  . [DISCONTINUED] simvastatin (ZOCOR) 20 MG tablet Take 1 tablet (20 mg total) by mouth every evening.   No facility-administered encounter medications on file as of 07/05/2019.     SIGNIFICANT DIAGNOSTIC EXAMS  TODAY  06-25-19: ct of abdomen and pelvis: Mid small bowel obstruction secondary to herniation of a small bowel loop into a LEFT infraumbilical ventral hernia, associated with mild bowel wall thickening but no evidence of perforation. Additional small LEFT supraumbilical ventral hernia containing fat. Foley catheter balloon is inflated within the inferior prostate gland; recommend replacement. Prostatic enlargement. Atherosclerotic calcifications including coronary arteries. Aortic Atherosclerosis   06-30-19: chest x-ray: Possible new infiltrate in the medial right lower lung zone.   LABS REVIEWED TODAY;  06-25-19: wbc 11.8; hgb 11.2; hct 35.1; mcv 87. 5plt 247; glucose 120; bun 28; creat 1.72; k+ 3.8; na++ 140; ca 9.0; liver normal albumin 4.1 urine culture: proteus mirabilis 06-28-19; wbc 7.2; hgb 9.8; hct 30.5; mcv 88. 9plt 162; glucose 112; bun 16; creat 1.25; k+ 3.0; na++ 139; ca 7.9 liver normal albumin 2.8  mag 1.9 07-01-19: wbc 8.1; hgb 8.8; hct 27.4; mcv 88.1 plt 199; glucose 99; bun 13; creat 1.37; k+ 3.1; na++ 139; ca 7.7 mag 1.8 07-04-19: wbc 6.1; hgb 8.4; hct 27.0; mcv 88.2 plt 206; glucose 100; bun 10; creat 1.05 ;k+ 3.8; na++ 140; ca 7.9 mag 1.8   Review of Systems  Constitutional: Negative for malaise/fatigue.  Respiratory: Negative for cough and shortness of breath.   Cardiovascular: Negative for chest pain, palpitations and leg swelling.  Gastrointestinal: Negative for abdominal pain, constipation and heartburn.  Musculoskeletal: Negative for back pain, joint pain and myalgias.  Skin: Negative.   Neurological: Negative for dizziness.  Psychiatric/Behavioral: The patient is not nervous/anxious.     Physical Exam Constitutional:      General: He is not in acute distress.    Appearance: He is well-developed. He is obese. He is not diaphoretic.  Neck:     Thyroid: Thyromegaly present.  Cardiovascular:     Rate and Rhythm: Normal rate and regular rhythm.     Pulses: Normal pulses.     Heart sounds: Normal heart sounds.  Pulmonary:     Effort: Pulmonary effort is normal. No respiratory distress.     Breath sounds: Normal breath sounds.  Abdominal:     General: Bowel sounds are normal. There is no distension.     Palpations: Abdomen is soft.     Tenderness: There is no abdominal tenderness.     Comments: 06-28-19: small bowel resection; lysis of adhesions; ventral hernia repair with mesh   Genitourinary:    Comments: foley Musculoskeletal:        General: Normal range of motion.     Cervical back: Neck supple.     Right lower leg: No edema.     Left lower leg: No edema.  Lymphadenopathy:     Cervical: No cervical adenopathy.  Skin:    General: Skin is warm and dry.     Comments: Incision line without signs of infection present   Neurological:     Mental Status: He is alert and oriented to  person, place, and time.  Psychiatric:        Mood and Affect: Mood normal.         ASSESSMENT/ PLAN:  TODAY  1. Small bowel obstruction/ventral hernia with obstruction : is stable is status post bowel resection lysis of adhesions ventral hernia repair with mesh: will conitnue oxycodone 5 mg every 6 hours as needed robaxin 750 mg every 8 hours as neededwill continue senna s twice daily and miralax daily s needed colace twice daily   2. Aspiration pneumonia of gastric secretions of right lung unspecified portion of lung/ urinary tract infection associated with indwelling urinary catheter subsequent encounter: is stable wil complete augmentin and mucinex and will monitor his status.   3. AKI (acute kidney injury) is stable bun 10 creat 1.05 will monitor  4. PAF(paroxysmal atrial fibrillation) heart rate is stable: will continue amiodarone 200 mg daily and lopressor 50 mg twice daily for rate control will continue eliquis 5 mg twice daily   5. CAD s/p percutaneous coronary angioplasty: is stable will continue lopressor 50 mg twice daily and has prn ntg   6. Essential hypertension: is stable b/p 152/65 will continue lopressor 50 mg twice daily   7. GERD without esophagitis: is stable will continue protonix 40 mg daily   8. Hyperthyroidism: is stable will continue tapazole 20 mg twice daily   9. Benign prostatic hypertrophy with urinary obstruction: is stable has long term foley; will continue proscar 5 mg daily and flomax 0.4 mg daily   10. Protein calorie malnutrition severe: is without change albumin 2.8 will continue supplements as directed   11. Bilateral  lower extremity edema: is stable will continue lasix 40 mg daily   12. Hypokalemia: is stable k+ 3.8 will continue k+ 10 meq daily   13. Mixed hyperlipidemia: is stable will continue pravachol 10 mg daily   14. Acute blood loss anemia: is stable hgb 8.4 will monitor         MD is aware of resident's narcotic use and is in agreement with current plan of care. We will attempt to wean resident as  appropriate.  Ok Edwards NP [facility]  Contact [phone] Monday through Friday 8am- 5pm  After hours call [phone]

## 2019-07-06 ENCOUNTER — Encounter: Payer: Self-pay | Admitting: Internal Medicine

## 2019-07-06 ENCOUNTER — Non-Acute Institutional Stay (SKILLED_NURSING_FACILITY): Payer: PPO | Admitting: Internal Medicine

## 2019-07-06 DIAGNOSIS — I482 Chronic atrial fibrillation, unspecified: Secondary | ICD-10-CM

## 2019-07-06 DIAGNOSIS — N179 Acute kidney failure, unspecified: Secondary | ICD-10-CM | POA: Diagnosis not present

## 2019-07-06 DIAGNOSIS — J690 Pneumonitis due to inhalation of food and vomit: Secondary | ICD-10-CM

## 2019-07-06 DIAGNOSIS — I10 Essential (primary) hypertension: Secondary | ICD-10-CM | POA: Diagnosis not present

## 2019-07-06 DIAGNOSIS — T83511D Infection and inflammatory reaction due to indwelling urethral catheter, subsequent encounter: Secondary | ICD-10-CM | POA: Diagnosis not present

## 2019-07-06 DIAGNOSIS — N390 Urinary tract infection, site not specified: Secondary | ICD-10-CM

## 2019-07-06 DIAGNOSIS — K56609 Unspecified intestinal obstruction, unspecified as to partial versus complete obstruction: Secondary | ICD-10-CM

## 2019-07-06 DIAGNOSIS — R195 Other fecal abnormalities: Secondary | ICD-10-CM

## 2019-07-06 NOTE — Assessment & Plan Note (Signed)
Proteus UTI treated.  Foley catheter exchange while inpatient

## 2019-07-06 NOTE — Patient Instructions (Signed)
See assessment and plan under each diagnosis in the problem list and acutely for this visit 

## 2019-07-06 NOTE — Progress Notes (Signed)
NURSING HOME LOCATION:  [facility] ROOM NUMBER: 156/W   CODE STATUS:  DNR [name], Derek Areola, MD     This is a comprehensive admission note to [facility] performed on this date less than 30 days from date of admission. Included are preadmission medical/surgical history; reconciled medication list; family history; social history and comprehensive review of systems.  Corrections and additions to the records were documented. Comprehensive physical exam was also performed. Additionally a clinical summary was entered for each active diagnosis pertinent to this admission in the Problem List to enhance continuity of care.  HPI: Patient was hospitalized 6/5-6/14/2021 presenting with 2-day history of generalized abdominal pain with coffee-ground emesis.  CT of the abdomen/pelvis revealed small bowel obstructions coming from ventral hernia with a loop of bowel in the hernia.  NG tube was placed and general surgery consulted.  Derek Foster was held and on 6/8 small bowel resection and primary repair of the hernia was completed by Dr. Constance Haw.  Amiodarone and metoprolol were continued perioperatively.  Derek Foster was resumed 6/12. Course was complicated by AKI which resolved. Proteus UTI was documented in the context of the chronic Foley catheter placement.  Full course of antibiotics were completed.  Catheter exchange was completed during his hospitalization; repeat exchange was planned in the next 28 days. Augmentin was continued for possible suspected aspiration pneumonia manifested as right-sided infiltrate. Pressure injury (stage II lesion left buttocks) was noted at admission; wound care was to be continued at the SNF.  Past medical and surgical history: Includes A. fib, BPH with chronic Foley placement, history of non-STEMI, history of hyperthyroidism on methimazole, dyslipidemia, essential hypertension, hypothyroidism, and history of Covid infection. Other procedures and surgeries include  cholecystectomy, coronary artery stent placement, and cystoscopy.  Social history: Nondrinker; former smoker.  Family history: Reviewed   Review of systems: He states that the abdominal binder is riding up and causing some discomfort.  He also describes loose stools which are very black.  Apparently FOB studies are pending.  He denies any other bleeding dyscrasias.  He did have bleeding from his bladder when he first went on the Derek Foster.  It is his belief that Derek Foster " weakened (my) bladder" so that he requires a permanent Foley catheter.  He describes numbness in the fifth left digit which he relates to having an IV placed while hospitalized.  He describes skin cancers over the left ear which a Dermatologist has biopsied.  Definitive treatment is pending his discharge from the SNF. Despite his apparent intermittent use of Sudafed he denies any active extrinsic symptoms at this time.  Constitutional: No fever, significant weight change, fatigue  Eyes: No redness, discharge, pain, vision change ENT/mouth: No nasal congestion, purulent discharge, earache, change in hearing, sore throat  Cardiovascular: No chest pain, palpitations, paroxysmal nocturnal dyspnea, claudication  Respiratory: No cough, sputum production, hemoptysis, DOE, significant snoring, apnea  Gastrointestinal: No heartburn, dysphagia, active nausea /vomiting, rectal bleeding, change in bowels Genitourinary: No dysuria, active hematuria, pyuria, incontinence, nocturia Musculoskeletal: No joint stiffness, joint swelling, weakness, pain Dermatologic: No rash, pruritus, new change in appearance of skin Neurologic: No dizziness, headache, syncope, seizures Psychiatric: No significant anxiety, depression, insomnia, anorexia Endocrine: No change in hair/skin/nails, excessive thirst, excessive hunger, excessive urination  Hematologic/lymphatic: No significant bruising, lymphadenopathy Allergy/immunology: No active itchy/watery eyes,  significant sneezing, urticaria, angioedema  Physical exam:  Pertinent or positive findings: Pattern alopecia is present.  Goiter is present on the left.  Both lobes of the thyroid are  palpable; R smaller than L.  Heart sounds are distant and slow.  Abdomen is protuberant despite wearing the binder.  He has 1+ pitting edema at the sock line.  Dorsalis pedis pulses are stronger than posterior tibial pulses.  The left ear is bandaged.  He has scattered bruising over the upper extremities, especially the hands.  He has intermittent tremor of the left hand.  General appearance: Adequately nourished; no acute distress, increased work of breathing is present.   Lymphatic: No lymphadenopathy about the head, neck, axilla. Eyes: No conjunctival inflammation or lid edema is present. There is no scleral icterus. Ears:  External ear exam shows no significant lesions or deformities.   Nose:  External nasal examination shows no deformity or inflammation. Nasal mucosa are pink and moist without lesions, exudates Oral exam: Lips and gums are healthy appearing.There is no oropharyngeal erythema or exudate. Neck:  No masses, tenderness noted.    Heart:  No gallop, murmur, click, rub.  Lungs: Chest clear to auscultation without wheezes, rhonchi, rales, rubs. Abdomen: Bowel sounds are normal.  Abdomen is soft and nontender with no organomegaly, hernias, masses. GU: Deferred  Extremities:  No cyanosis, clubbing. Neurologic exam: Balance, Rhomberg, finger to nose testing could not be completed due to clinical state Skin: Warm & dry w/o tenting. No significant rash.  See clinical summary under each active problem in the Problem List with associated updated therapeutic plan

## 2019-07-06 NOTE — Assessment & Plan Note (Signed)
Creatinine peaked at 1.72; creatinine 1.05 at discharge

## 2019-07-06 NOTE — Assessment & Plan Note (Signed)
BP controlled; no change in antihypertensive medications Decongestant will be held as it is relatively contraindicated with his hypertension and A. fib.

## 2019-07-06 NOTE — Assessment & Plan Note (Addendum)
EKGs while hospitalized were reviewed.  Both revealed normal sinus rhythm.  A. fib must be paroxysmal in nature. Lifelong maintenance Eliquis, amiodarone, and beta-blocker

## 2019-07-07 ENCOUNTER — Encounter (HOSPITAL_COMMUNITY)
Admission: RE | Admit: 2019-07-07 | Discharge: 2019-07-07 | Disposition: A | Discharge status: Home / Self Care | Payer: PPO | Source: Ambulatory Visit | Attending: Internal Medicine | Admitting: Internal Medicine

## 2019-07-07 ENCOUNTER — Non-Acute Institutional Stay (SKILLED_NURSING_FACILITY): Payer: PPO | Admitting: Adult Health

## 2019-07-07 ENCOUNTER — Encounter: Payer: Self-pay | Admitting: Adult Health

## 2019-07-07 ENCOUNTER — Telehealth: Payer: Self-pay

## 2019-07-07 DIAGNOSIS — I482 Chronic atrial fibrillation, unspecified: Secondary | ICD-10-CM | POA: Diagnosis not present

## 2019-07-07 DIAGNOSIS — K56609 Unspecified intestinal obstruction, unspecified as to partial versus complete obstruction: Secondary | ICD-10-CM | POA: Insufficient documentation

## 2019-07-07 DIAGNOSIS — E43 Unspecified severe protein-calorie malnutrition: Secondary | ICD-10-CM | POA: Diagnosis not present

## 2019-07-07 DIAGNOSIS — Z7901 Long term (current) use of anticoagulants: Secondary | ICD-10-CM | POA: Diagnosis not present

## 2019-07-07 DIAGNOSIS — N390 Urinary tract infection, site not specified: Secondary | ICD-10-CM | POA: Insufficient documentation

## 2019-07-07 DIAGNOSIS — K921 Melena: Secondary | ICD-10-CM | POA: Diagnosis not present

## 2019-07-07 LAB — OCCULT BLOOD X 1 CARD TO LAB, STOOL: Fecal Occult Bld: POSITIVE — AB

## 2019-07-07 NOTE — Telephone Encounter (Signed)
Pt called to ask why he was scheduled with our office in July. Pt was notified that [facility] will see him due to a positive Hemoccult. Pt is aware that means blood was detected in his stool.

## 2019-07-07 NOTE — Assessment & Plan Note (Signed)
Course of Augmentin completed.  He complains of loose stools.  Probiotic ordered.

## 2019-07-07 NOTE — Assessment & Plan Note (Signed)
Postop follow-up scheduled with Dr. Arnoldo Morale 07/12/2019.

## 2019-07-07 NOTE — Progress Notes (Signed)
Location:    [facility] Room Number: 156/Wdir Place of Service:  SNF (31)   CODE STATUS: DNR  Allergies  Allergen Reactions  . Indomethacin Anaphylaxis    But tolerates ibuprofen, aleve  . Iodine-131 Anaphylaxis    Chief Complaint  Patient presents with  . Acute Visit    Blood in Stool    HPI:  He has been noted to have blood in his stool has guaiac positive. Per his medical record that 12 days ago his stool was positive as well. He denies any nausea or vomiting. No diarrhea or constipation. He denies any vertigo or worsening weakness. He is presently taking eliquis for his afib.    Past Medical History:  Diagnosis Date  . CAD (coronary artery disease)    a. cath 01/27/2009 : s/p promus DES to LAD and medical managment of 70-80% mRCA  . COVID-19   . Essential hypertension   . Hypothyroidism   . Mixed hyperlipidemia   . NSTEMI (non-ST elevated myocardial infarction) ([place]) 2011  . PAF (paroxysmal atrial fibrillation) ([place])     Past Surgical History:  Procedure Laterality Date  . BOWEL RESECTION N/A 06/28/2019   Procedure: SMALL BOWEL RESECTION;  Surgeon: Virl Cagey, MD;  Location: [facility];  Service: General;  Laterality: N/A;  . CHOLECYSTECTOMY    . CORONARY STENT PLACEMENT    . CYSTOSCOPY/RETROGRADE/URETEROSCOPY Bilateral 03/09/2019   Procedure: CYSTOSCOPY, Clot Evacuation, Fulguration;  Surgeon: Festus Aloe, MD;  Location: [facility];  Service: Urology;  Laterality: Bilateral;  . HERNIA REPAIR    . LYSIS OF ADHESION N/A 06/28/2019   Procedure: LYSIS OF ADHESION;  Surgeon: Virl Cagey, MD;  Location: [facility];  Service: General;  Laterality: N/A;  . VENTRAL HERNIA REPAIR N/A 06/28/2019   Procedure: HERNIA REPAIR VENTRAL ADULT WITH VICRYL MESH OVERLAY;  Surgeon: Virl Cagey, MD;  Location: [facility];  Service: General;  Laterality: N/A;    Social History   Socioeconomic History  . Marital status: Married    Spouse name: Not on file   . Number of children: Not on file  . Years of education: Not on file  . Highest education level: Not on file  Occupational History  . Occupation: Charity fundraiser  Tobacco Use  . Smoking status: Former Smoker    Quit date: 01/20/1957    Years since quitting: 62.5  . Smokeless tobacco: Never Used  Vaping Use  . Vaping Use: Never used  Substance and Sexual Activity  . Alcohol use: No  . Drug use: Never  . Sexual activity: Not on file  Other Topics Concern  . Not on file  Social History Narrative   Quit smoking about 62 years ago.   Social Determinants of Health   Financial Resource Strain:   . Difficulty of Paying Living Expenses:   Food Insecurity:   . Worried About Charity fundraiser in the Last Year:   . Arboriculturist in the Last Year:   Transportation Needs:   . Film/video editor (Medical):   Marland Kitchen Lack of Transportation (Non-Medical):   Physical Activity:   . Days of Exercise per Week:   . Minutes of Exercise per Session:   Stress:   . Feeling of Stress :   Social Connections:   . Frequency of Communication with Friends and Family:   . Frequency of Social Gatherings with Friends and Family:   . Attends Religious Services:   . Active Member of Clubs  or Organizations:   . Attends Archivist Meetings:   Marland Kitchen Marital Status:   Intimate Partner Violence:   . Fear of Current or Ex-Partner:   . Emotionally Abused:   Marland Kitchen Physically Abused:   . Sexually Abused:    Family History  Problem Relation Age of Onset  . Heart attack Father   . Lung cancer Sister       VITAL SIGNS BP 120/76   Pulse 68   Temp 98.7 F (37.1 C) (Oral)   Resp 14   Ht 6' (1.829 m)   Wt 270 lb (122.5 kg)   BMI 36.62 kg/m   Outpatient Encounter Medications as of 07/07/2019  Medication Sig  . amiodarone (PACERONE) 200 MG tablet Take 1 tablet (200 mg total) by mouth daily.  Roseanne Kaufman Peru-Castor Oil (VENELEX) OINT Apply topically. Special Instructions: Apply to sacrum, coccyx and  bilateral buttocks qshift for prevention. Every Shift Day, Evening, Night  . bisacodyl (DULCOLAX) 10 MG suppository Place 1 suppository (10 mg total) rectally daily as needed for severe constipation.  . Coenzyme Q10 200 MG capsule Take 200 mg by mouth daily.    Marland Kitchen docusate sodium (COLACE) 100 MG capsule Take 1 capsule (100 mg total) by mouth 2 (two) times daily.  . finasteride (PROSCAR) 5 MG tablet Take 1 tablet (5 mg total) by mouth daily.  . fluticasone (FLONASE ALLERGY RELIEF) 50 MCG/ACT nasal spray Place 1 spray into both nostrils at bedtime.  . furosemide (LASIX) 20 MG tablet Take 2 tablets (40 mg total) by mouth daily.  Marland Kitchen guaiFENesin (MUCINEX) 600 MG 12 hr tablet Take 1 tablet (600 mg total) by mouth 2 (two) times daily for 10 days.  . methimazole (TAPAZOLE) 10 MG tablet Take 2 tablets (20 mg total) by mouth 2 (two) times daily.  . methocarbamol (ROBAXIN) 750 MG tablet Take 1 tablet (750 mg total) by mouth every 8 (eight) hours as needed for muscle spasms.  . metoprolol tartrate (LOPRESSOR) 100 MG tablet Take 0.5 tablets (50 mg total) by mouth 2 (two) times daily.  . Multiple Vitamin (MULTIVITAMIN) tablet Take 1 tablet by mouth daily.    . nitroGLYCERIN (NITROSTAT) 0.4 MG SL tablet Place 1 tablet (0.4 mg total) under the tongue every 5 (five) minutes as needed.  . NON FORMULARY Diet - NAS  . Nutritional Supplements (ENSURE CLEAR) LIQD Take 237 mLs by mouth daily.  Marland Kitchen ofloxacin (OCUFLOX) 0.3 % ophthalmic solution Place 1 drop into both eyes 4 (four) times daily.   . ondansetron (ZOFRAN) 4 MG tablet Take 1 tablet (4 mg total) by mouth every 6 (six) hours as needed for nausea or vomiting.  Marland Kitchen oxyCODONE (OXY IR/ROXICODONE) 5 MG immediate release tablet Take 1 tablet (5 mg total) by mouth every 6 (six) hours as needed for up to 7 days for moderate pain, severe pain or breakthrough pain.  . pantoprazole (PROTONIX) 40 MG tablet Take 40 mg by mouth daily.  . polyethylene glycol (MIRALAX / GLYCOLAX)  17 g packet Take 17 g by mouth daily as needed for mild constipation.  . potassium chloride (KLOR-CON) 10 MEQ tablet Take 1 tablet (10 mEq total) by mouth every evening.  . pravastatin (PRAVACHOL) 10 MG tablet Take 1 tablet (10 mg total) by mouth daily at 6 PM.  . Probiotic Product (RISA-BID PROBIOTIC) TABS Take 1 tablet by mouth 2 (two) times daily.  Marland Kitchen senna-docusate (SENOKOT-S) 8.6-50 MG tablet Take 1 tablet by mouth 2 (two) times daily.  . tamsulosin (  FLOMAX) 0.4 MG CAPS capsule Take 1 capsule (0.4 mg total) by mouth daily after breakfast.  . [DISCONTINUED] apixaban (ELIQUIS) 5 MG TABS tablet Take 1 tablet (5 mg total) by mouth 2 (two) times daily.  . [DISCONTINUED] pseudoephedrine (SUDAFED) 30 MG tablet Take 30 mg by mouth every 6 (six) hours as needed for congestion.  . [DISCONTINUED] simvastatin (ZOCOR) 20 MG tablet Take 1 tablet (20 mg total) by mouth every evening.   No facility-administered encounter medications on file as of 07/07/2019.     SIGNIFICANT DIAGNOSTIC EXAMS  PREVIOUS   06-25-19: ct of abdomen and pelvis: Mid small bowel obstruction secondary to herniation of a small bowel loop into a LEFT infraumbilical ventral hernia, associated with mild bowel wall thickening but no evidence of perforation. Additional small LEFT supraumbilical ventral hernia containing fat. Foley catheter balloon is inflated within the inferior prostate gland; recommend replacement. Prostatic enlargement. Atherosclerotic calcifications including coronary arteries. Aortic Atherosclerosis   06-30-19: chest x-ray: Possible new infiltrate in the medial right lower lung zone.   NO NEW EXAMS.   LABS REVIEWED PREVIOUS   06-25-19: wbc 11.8; hgb 11.2; hct 35.1; mcv 87. 5plt 247; glucose 120; bun 28; creat 1.72; k+ 3.8; na++ 140; ca 9.0; liver normal albumin 4.1 urine culture: proteus mirabilis 06-28-19; wbc 7.2; hgb 9.8; hct 30.5; mcv 88. 9plt 162; glucose 112; bun 16; creat 1.25; k+ 3.0; na++ 139; ca 7.9  liver normal albumin 2.8 mag 1.9 07-01-19: wbc 8.1; hgb 8.8; hct 27.4; mcv 88.1 plt 199; glucose 99; bun 13; creat 1.37; k+ 3.1; na++ 139; ca 7.7 mag 1.8 07-04-19: wbc 6.1; hgb 8.4; hct 27.0; mcv 88.2 plt 206; glucose 100; bun 10; creat 1.05 ;k+ 3.8; na++ 140; ca 7.9 mag 1.8   TODAY  07-07-19: guaiac +   Review of Systems  Constitutional: Negative for malaise/fatigue.  Respiratory: Negative for cough and shortness of breath.   Cardiovascular: Negative for chest pain, palpitations and leg swelling.  Gastrointestinal: Positive for blood in stool. Negative for abdominal pain, constipation and heartburn.  Musculoskeletal: Negative for back pain, joint pain and myalgias.  Skin: Negative.   Neurological: Negative for dizziness.  Psychiatric/Behavioral: The patient is not nervous/anxious.    Physical Exam Constitutional:      General: He is not in acute distress.    Appearance: He is well-developed. He is obese. He is not diaphoretic.  Neck:     Thyroid: Thyromegaly present.  Cardiovascular:     Rate and Rhythm: Normal rate and regular rhythm.     Heart sounds: Normal heart sounds.  Pulmonary:     Effort: Pulmonary effort is normal. No respiratory distress.     Breath sounds: Normal breath sounds.  Abdominal:     General: Bowel sounds are normal. There is no distension.     Palpations: Abdomen is soft.     Tenderness: There is no abdominal tenderness.     Comments: 06-28-19: small bowel resection; lysis of adhesions; ventral hernia repair with mesh    Genitourinary:    Comments: foley Musculoskeletal:        General: Normal range of motion.     Cervical back: Neck supple.     Right lower leg: No edema.     Left lower leg: No edema.  Lymphadenopathy:     Cervical: No cervical adenopathy.  Skin:    General: Skin is warm and dry.  Neurological:     Mental Status: He is alert and oriented to person, place, and  time.  Psychiatric:        Mood and Affect: Mood normal.       ASSESSMENT/ PLAN:  TODAY  1. Atrial fibrillation chronic 2. Blood in stool  Will stop eliquis due to him heme positive stool Will check hgb/hct in the AM and will monitor his status.     MD is aware of resident's narcotic use and is in agreement with current plan of care. We will attempt to wean resident as appropriate.  Ok Edwards NP [facility]  Contact [phone] Monday through Friday 8am- 5pm  After hours call [phone]

## 2019-07-08 ENCOUNTER — Encounter (HOSPITAL_COMMUNITY)
Admission: RE | Admit: 2019-07-08 | Discharge: 2019-07-08 | Disposition: A | Discharge status: Home / Self Care | Payer: PPO | Source: Skilled Nursing Facility | Attending: Adult Health | Admitting: Adult Health

## 2019-07-08 DIAGNOSIS — K56609 Unspecified intestinal obstruction, unspecified as to partial versus complete obstruction: Secondary | ICD-10-CM | POA: Diagnosis not present

## 2019-07-08 LAB — HEMOGLOBIN AND HEMATOCRIT, BLOOD
HCT: 26.6 % — ABNORMAL LOW (ref 39.0–52.0)
Hemoglobin: 8.7 g/dL — ABNORMAL LOW (ref 13.0–17.0)

## 2019-07-08 LAB — OCCULT BLOOD X 1 CARD TO LAB, STOOL: Fecal Occult Bld: POSITIVE — AB

## 2019-07-09 ENCOUNTER — Encounter (HOSPITAL_COMMUNITY)
Admission: RE | Admit: 2019-07-09 | Discharge: 2019-07-09 | Disposition: A | Discharge status: Home / Self Care | Payer: PPO | Source: Skilled Nursing Facility | Attending: *Deleted | Admitting: *Deleted

## 2019-07-09 DIAGNOSIS — K56609 Unspecified intestinal obstruction, unspecified as to partial versus complete obstruction: Secondary | ICD-10-CM | POA: Diagnosis not present

## 2019-07-09 LAB — OCCULT BLOOD X 1 CARD TO LAB, STOOL: Fecal Occult Bld: POSITIVE — AB

## 2019-07-10 DIAGNOSIS — E876 Hypokalemia: Secondary | ICD-10-CM | POA: Insufficient documentation

## 2019-07-10 DIAGNOSIS — R600 Localized edema: Secondary | ICD-10-CM | POA: Insufficient documentation

## 2019-07-11 ENCOUNTER — Non-Acute Institutional Stay (SKILLED_NURSING_FACILITY): Payer: PPO | Admitting: Adult Health

## 2019-07-11 ENCOUNTER — Encounter: Payer: Self-pay | Admitting: Adult Health

## 2019-07-11 ENCOUNTER — Other Ambulatory Visit (HOSPITAL_COMMUNITY)
Admission: RE | Admit: 2019-07-11 | Discharge: 2019-07-11 | Disposition: A | Discharge status: Home / Self Care | Payer: PPO | Source: Skilled Nursing Facility | Attending: Adult Health | Admitting: Adult Health

## 2019-07-11 DIAGNOSIS — E876 Hypokalemia: Secondary | ICD-10-CM

## 2019-07-11 DIAGNOSIS — I10 Essential (primary) hypertension: Secondary | ICD-10-CM | POA: Insufficient documentation

## 2019-07-11 DIAGNOSIS — D62 Acute posthemorrhagic anemia: Secondary | ICD-10-CM

## 2019-07-11 LAB — HEPATIC FUNCTION PANEL
ALT: 11 U/L (ref 0–44)
AST: 14 U/L — ABNORMAL LOW (ref 15–41)
Albumin: 2.8 g/dL — ABNORMAL LOW (ref 3.5–5.0)
Alkaline Phosphatase: 55 U/L (ref 38–126)
Bilirubin, Direct: <0.1 mg/dL (ref 0.0–0.2)
Total Bilirubin: 0.3 mg/dL (ref 0.3–1.2)
Total Protein: 5.2 g/dL — ABNORMAL LOW (ref 6.5–8.1)

## 2019-07-11 LAB — CBC
HCT: 25.5 % — ABNORMAL LOW (ref 39.0–52.0)
Hemoglobin: 8.1 g/dL — ABNORMAL LOW (ref 13.0–17.0)
MCH: 27.8 pg (ref 26.0–34.0)
MCHC: 31.8 g/dL (ref 30.0–36.0)
MCV: 87.6 fL (ref 80.0–100.0)
Platelets: 186 10*3/uL (ref 150–400)
RBC: 2.91 MIL/uL — ABNORMAL LOW (ref 4.22–5.81)
RDW: 15.5 % (ref 11.5–15.5)
WBC: 6.6 10*3/uL (ref 4.0–10.5)
nRBC: 0 % (ref 0.0–0.2)

## 2019-07-11 LAB — BASIC METABOLIC PANEL
Anion gap: 8 (ref 5–15)
BUN: 20 mg/dL (ref 8–23)
CO2: 25 mmol/L (ref 22–32)
Calcium: 8.1 mg/dL — ABNORMAL LOW (ref 8.9–10.3)
Chloride: 107 mmol/L (ref 98–111)
Creatinine, Ser: 1.69 mg/dL — ABNORMAL HIGH (ref 0.61–1.24)
GFR calc Af Amer: 43 mL/min — ABNORMAL LOW (ref >60)
GFR calc non Af Amer: 37 mL/min — ABNORMAL LOW (ref >60)
Glucose, Bld: 89 mg/dL (ref 70–99)
Potassium: 3.1 mmol/L — ABNORMAL LOW (ref 3.5–5.1)
Sodium: 140 mmol/L (ref 135–145)

## 2019-07-11 NOTE — Progress Notes (Signed)
Location:    [facility] Room Number: 156/W Place of Service:  SNF (31)   CODE STATUS: DNR   Allergies  Allergen Reactions  . Indomethacin Anaphylaxis    But tolerates ibuprofen, aleve  . Iodine-131 Anaphylaxis    Chief Complaint  Patient presents with  . Follow-up    Lab Follow Up    HPI:    Past Medical History:  Diagnosis Date  . CAD (coronary artery disease)    a. cath 01/27/2009 : s/p promus DES to LAD and medical managment of 70-80% mRCA  . COVID-19   . Essential hypertension   . Hypothyroidism   . Mixed hyperlipidemia   . NSTEMI (non-ST elevated myocardial infarction) ([place]) 2011  . PAF (paroxysmal atrial fibrillation) ([place])     Past Surgical History:  Procedure Laterality Date  . BOWEL RESECTION N/A 06/28/2019   Procedure: SMALL BOWEL RESECTION;  Surgeon: Virl Cagey, MD;  Location: [facility];  Service: General;  Laterality: N/A;  . CHOLECYSTECTOMY    . CORONARY STENT PLACEMENT    . CYSTOSCOPY/RETROGRADE/URETEROSCOPY Bilateral 03/09/2019   Procedure: CYSTOSCOPY, Clot Evacuation, Fulguration;  Surgeon: Festus Aloe, MD;  Location: [facility];  Service: Urology;  Laterality: Bilateral;  . HERNIA REPAIR    . LYSIS OF ADHESION N/A 06/28/2019   Procedure: LYSIS OF ADHESION;  Surgeon: Virl Cagey, MD;  Location: [facility];  Service: General;  Laterality: N/A;  . VENTRAL HERNIA REPAIR N/A 06/28/2019   Procedure: HERNIA REPAIR VENTRAL ADULT WITH VICRYL MESH OVERLAY;  Surgeon: Virl Cagey, MD;  Location: [facility];  Service: General;  Laterality: N/A;    Social History   Socioeconomic History  . Marital status: Married    Spouse name: Not on file  . Number of children: Not on file  . Years of education: Not on file  . Highest education level: Not on file  Occupational History  . Occupation: Charity fundraiser  Tobacco Use  . Smoking status: Former Smoker    Quit date: 01/20/1957    Years since quitting: 62.5  . Smokeless tobacco: Never Used   Vaping Use  . Vaping Use: Never used  Substance and Sexual Activity  . Alcohol use: No  . Drug use: Never  . Sexual activity: Not on file  Other Topics Concern  . Not on file  Social History Narrative   Quit smoking about 62 years ago.   Social Determinants of Health   Financial Resource Strain:   . Difficulty of Paying Living Expenses:   Food Insecurity:   . Worried About Charity fundraiser in the Last Year:   . Arboriculturist in the Last Year:   Transportation Needs:   . Film/video editor (Medical):   Marland Kitchen Lack of Transportation (Non-Medical):   Physical Activity:   . Days of Exercise per Week:   . Minutes of Exercise per Session:   Stress:   . Feeling of Stress :   Social Connections:   . Frequency of Communication with Friends and Family:   . Frequency of Social Gatherings with Friends and Family:   . Attends Religious Services:   . Active Member of Clubs or Organizations:   . Attends Archivist Meetings:   Marland Kitchen Marital Status:   Intimate Partner Violence:   . Fear of Current or Ex-Partner:   . Emotionally Abused:   Marland Kitchen Physically Abused:   . Sexually Abused:    Family History  Problem Relation Age of  Onset  . Heart attack Father   . Lung cancer Sister       VITAL SIGNS BP (!) 144/72   Pulse (!) 59   Temp 98.3 F (36.8 C) (Oral)   Resp 20   Ht 6' (1.829 m)   Wt 271 lb 8 oz (123.2 kg)   BMI 36.82 kg/m   Outpatient Encounter Medications as of 07/11/2019  Medication Sig  . amiodarone (PACERONE) 200 MG tablet Take 1 tablet (200 mg total) by mouth daily.  Roseanne Kaufman [place]-Castor Oil (VENELEX) OINT Apply topically. Special Instructions: Apply to sacrum, coccyx and bilateral buttocks qshift for prevention. Every Shift Day, Evening, Night  . bisacodyl (DULCOLAX) 10 MG suppository Place 1 suppository (10 mg total) rectally daily as needed for severe constipation.  . Coenzyme Q10 200 MG capsule Take 200 mg by mouth daily.    Marland Kitchen docusate sodium  (COLACE) 100 MG capsule Take 1 capsule (100 mg total) by mouth 2 (two) times daily.  . finasteride (PROSCAR) 5 MG tablet Take 1 tablet (5 mg total) by mouth daily.  . fluticasone (FLONASE ALLERGY RELIEF) 50 MCG/ACT nasal spray Place 1 spray into both nostrils at bedtime.  . furosemide (LASIX) 20 MG tablet Take 2 tablets (40 mg total) by mouth daily.  Marland Kitchen guaiFENesin (MUCINEX) 600 MG 12 hr tablet Take 1 tablet (600 mg total) by mouth 2 (two) times daily for 10 days.  . methimazole (TAPAZOLE) 10 MG tablet Take 2 tablets (20 mg total) by mouth 2 (two) times daily.  . methocarbamol (ROBAXIN) 750 MG tablet Take 1 tablet (750 mg total) by mouth every 8 (eight) hours as needed for muscle spasms.  . metoprolol tartrate (LOPRESSOR) 100 MG tablet Take 0.5 tablets (50 mg total) by mouth 2 (two) times daily.  . Multiple Vitamin (MULTIVITAMIN) tablet Take 1 tablet by mouth daily.    . nitroGLYCERIN (NITROSTAT) 0.4 MG SL tablet Place 1 tablet (0.4 mg total) under the tongue every 5 (five) minutes as needed.  . NON FORMULARY Diet - NAS  . Nutritional Supplements (ENSURE CLEAR) LIQD Take 237 mLs by mouth daily.  Marland Kitchen ofloxacin (OCUFLOX) 0.3 % ophthalmic solution Place 1 drop into both eyes 4 (four) times daily.   . ondansetron (ZOFRAN) 4 MG tablet Take 1 tablet (4 mg total) by mouth every 6 (six) hours as needed for nausea or vomiting.  . pantoprazole (PROTONIX) 40 MG tablet Take 40 mg by mouth daily.  . polyethylene glycol (MIRALAX / GLYCOLAX) 17 g packet Take 17 g by mouth daily as needed for mild constipation.  . potassium chloride (KLOR-CON) 10 MEQ tablet Take 1 tablet (10 mEq total) by mouth every evening.  . pravastatin (PRAVACHOL) 10 MG tablet Take 1 tablet (10 mg total) by mouth daily at 6 PM.  . Probiotic Product (RISA-BID PROBIOTIC) TABS Take 1 tablet by mouth 2 (two) times daily.  Marland Kitchen senna-docusate (SENOKOT-S) 8.6-50 MG tablet Take 1 tablet by mouth 2 (two) times daily.  . tamsulosin (FLOMAX) 0.4 MG CAPS  capsule Take 1 capsule (0.4 mg total) by mouth daily after breakfast.  . [DISCONTINUED] oxyCODONE (OXY IR/ROXICODONE) 5 MG immediate release tablet Take 1 tablet (5 mg total) by mouth every 6 (six) hours as needed for up to 7 days for moderate pain, severe pain or breakthrough pain.  . [DISCONTINUED] simvastatin (ZOCOR) 20 MG tablet Take 1 tablet (20 mg total) by mouth every evening.   No facility-administered encounter medications on file as of 07/11/2019.  SIGNIFICANT DIAGNOSTIC EXAMS   PREVIOUS   06-25-19: ct of abdomen and pelvis: Mid small bowel obstruction secondary to herniation of a small bowel loop into a LEFT infraumbilical ventral hernia, associated with mild bowel wall thickening but no evidence of perforation. Additional small LEFT supraumbilical ventral hernia containing fat. Foley catheter balloon is inflated within the inferior prostate gland; recommend replacement. Prostatic enlargement. Atherosclerotic calcifications including coronary arteries. Aortic Atherosclerosis   06-30-19: chest x-ray: Possible new infiltrate in the medial right lower lung zone.   NO NEW EXAMS.   LABS REVIEWED PREVIOUS   06-25-19: wbc 11.8; hgb 11.2; hct 35.1; mcv 87. 5plt 247; glucose 120; bun 28; creat 1.72; k+ 3.8; na++ 140; ca 9.0; liver normal albumin 4.1 urine culture: proteus mirabilis 06-28-19; wbc 7.2; hgb 9.8; hct 30.5; mcv 88. 9plt 162; glucose 112; bun 16; creat 1.25; k+ 3.0; na++ 139; ca 7.9 liver normal albumin 2.8 mag 1.9 07-01-19: wbc 8.1; hgb 8.8; hct 27.4; mcv 88.1 plt 199; glucose 99; bun 13; creat 1.37; k+ 3.1; na++ 139; ca 7.7 mag 1.8 07-04-19: wbc 6.1; hgb 8.4; hct 27.0; mcv 88.2 plt 206; glucose 100; bun 10; creat 1.05 ;k+ 3.8; na++ 140; ca 7.9 mag 1.8   TODAY  07-07-19: guaiac +  07-09-19: guaiac + 07-11-19: wbc 6.6; hgb 8.1; hct 25.5; mcv 87.6 plt 186; glucose 89; bun 20; creat 1.69; k+ 3.1; na++ 140; ca 8.1 liver normal albumin 2.8   Review of Systems  Constitutional:  Negative for malaise/fatigue.  Respiratory: Negative for cough and shortness of breath.   Cardiovascular: Negative for chest pain, palpitations and leg swelling.  Gastrointestinal: Positive for blood in stool. Negative for abdominal pain, constipation and heartburn.  Musculoskeletal: Negative for back pain, joint pain and myalgias.  Skin: Negative.   Neurological: Negative for dizziness.  Psychiatric/Behavioral: The patient is not nervous/anxious.    Physical Exam Constitutional:      General: He is not in acute distress.    Appearance: He is well-developed. He is obese. He is not diaphoretic.  Neck:     Thyroid: Thyromegaly present.  Cardiovascular:     Rate and Rhythm: Normal rate and regular rhythm.     Heart sounds: Normal heart sounds.  Pulmonary:     Effort: Pulmonary effort is normal. No respiratory distress.     Breath sounds: Normal breath sounds.  Abdominal:     General: Bowel sounds are normal. There is no distension.     Palpations: Abdomen is soft.     Tenderness: There is no abdominal tenderness.     Comments: 06-28-19: small bowel resection; lysis of adhesions; ventral hernia repair with mesh     Genitourinary:    Comments: foley Musculoskeletal:        General: Normal range of motion.     Right lower leg: No edema.     Left lower leg: No edema.  Lymphadenopathy:     Cervical: No cervical adenopathy.  Skin:    General: Skin is warm and dry.  Neurological:     Mental Status: He is alert and oriented to person, place, and time.  Psychiatric:        Mood and Affect: Mood normal.       ASSESSMENT/ PLAN:  TODAY  1. Acute blood loss anemia 2. Hypokalemia  Will increase k+ to 10 meq twice daily  Will give k+ 20 meq now Will repeat hgb/hct k+ on 07-14-19.    MD is aware of resident's narcotic use and  is in agreement with current plan of care. We will attempt to wean resident as appropriate.  Ok Edwards NP [facility]  Contact  [phone] Monday through Friday 8am- 5pm  After hours call [phone]

## 2019-07-12 ENCOUNTER — Ambulatory Visit: Payer: PPO | Admitting: Gastroenterology

## 2019-07-12 ENCOUNTER — Encounter: Payer: Self-pay | Admitting: Adult Health

## 2019-07-12 ENCOUNTER — Encounter: Payer: Self-pay | Admitting: Gastroenterology

## 2019-07-12 ENCOUNTER — Non-Acute Institutional Stay (SKILLED_NURSING_FACILITY): Payer: PPO | Admitting: Adult Health

## 2019-07-12 VITALS — BP 155/56 | HR 55 | Temp 97.1°F | Ht 72.0 in | Wt 267.0 lb

## 2019-07-12 DIAGNOSIS — D62 Acute posthemorrhagic anemia: Secondary | ICD-10-CM

## 2019-07-12 DIAGNOSIS — K921 Melena: Secondary | ICD-10-CM | POA: Diagnosis not present

## 2019-07-12 DIAGNOSIS — K56609 Unspecified intestinal obstruction, unspecified as to partial versus complete obstruction: Secondary | ICD-10-CM

## 2019-07-12 DIAGNOSIS — R195 Other fecal abnormalities: Secondary | ICD-10-CM | POA: Diagnosis not present

## 2019-07-12 DIAGNOSIS — I482 Chronic atrial fibrillation, unspecified: Secondary | ICD-10-CM

## 2019-07-12 NOTE — Patient Instructions (Signed)
1. Repeat labs in one week: CBC, iron/tibc/ferritin. 2. Increase pantoprazole to 40 mg 30 minutes before breakfast and 30 minutes before evening meal.  3. Upper endoscopy to be scheduled in near future.  4. Call with drop in Hgb or recurrent melena (black stools).

## 2019-07-12 NOTE — Progress Notes (Signed)
Location:    [facility] Room Number: 156/W Place of Service:  SNF (31)   CODE STATUS: DNR  Allergies  Allergen Reactions  . Indomethacin Anaphylaxis    But tolerates ibuprofen, aleve  . Iodine-131 Anaphylaxis    Chief Complaint  Patient presents with  . Short Term Rehab         Small bowel obstruction/ventral hernia with obstruction:   PAF (paroxysmal atrial fibrillation)   AKI (acute kidney injury)   Weekly follow up for the first 30 days post hospitalization.     HPI:  He is a 84 year old short term rehab patient being seen for the management of his chronic illnesses: sbo; afib; aki. His renal function is stable. He has had heme+ stools and has been seen by GI awaiting upper endoscopy. No reports of uncontrolled pain. No changes in appetite; no nausea vomiting or constipation.   Past Medical History:  Diagnosis Date  . CAD (coronary artery disease)    a. cath 01/27/2009 : s/p promus DES to LAD and medical managment of 70-80% mRCA  . COVID-19   . Essential hypertension   . Hypothyroidism   . Mixed hyperlipidemia   . NSTEMI (non-ST elevated myocardial infarction) ([place]) 2011  . PAF (paroxysmal atrial fibrillation) ([place])     Past Surgical History:  Procedure Laterality Date  . BOWEL RESECTION N/A 06/28/2019   Procedure: SMALL BOWEL RESECTION;  Surgeon: Virl Cagey, MD;  Location: [facility];  Service: General;  Laterality: N/A;  . CHOLECYSTECTOMY    . CORONARY STENT PLACEMENT    . CYSTOSCOPY/RETROGRADE/URETEROSCOPY Bilateral 03/09/2019   Procedure: CYSTOSCOPY, Clot Evacuation, Fulguration;  Surgeon: Festus Aloe, MD;  Location: [facility];  Service: Urology;  Laterality: Bilateral;  . HERNIA REPAIR    . LYSIS OF ADHESION N/A 06/28/2019   Procedure: LYSIS OF ADHESION;  Surgeon: Virl Cagey, MD;  Location: [facility];  Service: General;  Laterality: N/A;  . VENTRAL HERNIA REPAIR N/A 06/28/2019   Procedure: HERNIA REPAIR VENTRAL ADULT WITH VICRYL MESH  OVERLAY;  Surgeon: Virl Cagey, MD;  Location: [facility];  Service: General;  Laterality: N/A;    Social History   Socioeconomic History  . Marital status: Married    Spouse name: Not on file  . Number of children: Not on file  . Years of education: Not on file  . Highest education level: Not on file  Occupational History  . Occupation: Charity fundraiser  Tobacco Use  . Smoking status: Former Smoker    Quit date: 01/20/1957    Years since quitting: 62.5  . Smokeless tobacco: Never Used  Vaping Use  . Vaping Use: Never used  Substance and Sexual Activity  . Alcohol use: No  . Drug use: Never  . Sexual activity: Not on file  Other Topics Concern  . Not on file  Social History Narrative   Quit smoking about 62 years ago.   Social Determinants of Health   Financial Resource Strain:   . Difficulty of Paying Living Expenses:   Food Insecurity:   . Worried About Charity fundraiser in the Last Year:   . Arboriculturist in the Last Year:   Transportation Needs:   . Film/video editor (Medical):   Marland Kitchen Lack of Transportation (Non-Medical):   Physical Activity:   . Days of Exercise per Week:   . Minutes of Exercise per Session:   Stress:   . Feeling of Stress :  Social Connections:   . Frequency of Communication with Friends and Family:   . Frequency of Social Gatherings with Friends and Family:   . Attends Religious Services:   . Active Member of Clubs or Organizations:   . Attends Archivist Meetings:   Marland Kitchen Marital Status:   Intimate Partner Violence:   . Fear of Current or Ex-Partner:   . Emotionally Abused:   Marland Kitchen Physically Abused:   . Sexually Abused:    Family History  Problem Relation Age of Onset  . Heart attack Father   . Lung cancer Sister       VITAL SIGNS BP 116/67   Pulse (!) 58   Temp 98.1 F (36.7 C) (Oral)   Resp 18   Ht 6' (1.829 m)   Wt 271 lb 8 oz (123.2 kg)   BMI 36.82 kg/m   Outpatient Encounter Medications as of 07/12/2019    Medication Sig  . amiodarone (PACERONE) 200 MG tablet Take 1 tablet (200 mg total) by mouth daily.  Roseanne Kaufman [place]-Castor Oil (VENELEX) OINT Apply topically. Special Instructions: Apply to sacrum, coccyx and bilateral buttocks qshift for prevention. Every Shift Day, Evening, Night  . bisacodyl (DULCOLAX) 10 MG suppository Place 1 suppository (10 mg total) rectally daily as needed for severe constipation.  . Coenzyme Q10 200 MG capsule Take 200 mg by mouth daily.    Marland Kitchen docusate sodium (COLACE) 100 MG capsule Take 1 capsule (100 mg total) by mouth 2 (two) times daily.  . finasteride (PROSCAR) 5 MG tablet Take 1 tablet (5 mg total) by mouth daily.  . fluticasone (FLONASE ALLERGY RELIEF) 50 MCG/ACT nasal spray Place 1 spray into both nostrils at bedtime.  . furosemide (LASIX) 20 MG tablet Take 2 tablets (40 mg total) by mouth daily.  Marland Kitchen guaiFENesin (MUCINEX) 600 MG 12 hr tablet Take 1 tablet (600 mg total) by mouth 2 (two) times daily for 10 days.  . methimazole (TAPAZOLE) 10 MG tablet Take 2 tablets (20 mg total) by mouth 2 (two) times daily.  . methocarbamol (ROBAXIN) 750 MG tablet Take 1 tablet (750 mg total) by mouth every 8 (eight) hours as needed for muscle spasms.  . metoprolol tartrate (LOPRESSOR) 100 MG tablet Take 0.5 tablets (50 mg total) by mouth 2 (two) times daily.  . Multiple Vitamin (MULTIVITAMIN) tablet Take 1 tablet by mouth daily.    . nitroGLYCERIN (NITROSTAT) 0.4 MG SL tablet Place 1 tablet (0.4 mg total) under the tongue every 5 (five) minutes as needed.  . NON FORMULARY Diet - NAS  . Nutritional Supplements (ENSURE CLEAR) LIQD Take 237 mLs by mouth daily.  Marland Kitchen ofloxacin (OCUFLOX) 0.3 % ophthalmic solution Place 1 drop into both eyes 4 (four) times daily.   . ondansetron (ZOFRAN) 4 MG tablet Take 1 tablet (4 mg total) by mouth every 6 (six) hours as needed for nausea or vomiting.  . pantoprazole (PROTONIX) 40 MG tablet Take 40 mg by mouth daily.  . polyethylene glycol (MIRALAX /  GLYCOLAX) 17 g packet Take 17 g by mouth daily as needed for mild constipation.  . potassium chloride (KLOR-CON) 10 MEQ tablet Take 1 tablet (10 mEq total) by mouth every evening.  . pravastatin (PRAVACHOL) 10 MG tablet Take 1 tablet (10 mg total) by mouth daily at 6 PM.  . Probiotic Product (RISA-BID PROBIOTIC) TABS Take 1 tablet by mouth 2 (two) times daily.  Marland Kitchen senna-docusate (SENOKOT-S) 8.6-50 MG tablet Take 1 tablet by mouth 2 (two) times daily.  Marland Kitchen  tamsulosin (FLOMAX) 0.4 MG CAPS capsule Take 1 capsule (0.4 mg total) by mouth daily after breakfast.  . [DISCONTINUED] simvastatin (ZOCOR) 20 MG tablet Take 1 tablet (20 mg total) by mouth every evening.   No facility-administered encounter medications on file as of 07/12/2019.     SIGNIFICANT DIAGNOSTIC EXAMS   PREVIOUS   06-25-19: ct of abdomen and pelvis: Mid small bowel obstruction secondary to herniation of a small bowel loop into a LEFT infraumbilical ventral hernia, associated with mild bowel wall thickening but no evidence of perforation. Additional small LEFT supraumbilical ventral hernia containing fat. Foley catheter balloon is inflated within the inferior prostate gland; recommend replacement. Prostatic enlargement. Atherosclerotic calcifications including coronary arteries. Aortic Atherosclerosis   06-30-19: chest x-ray: Possible new infiltrate in the medial right lower lung zone.   NO NEW EXAMS.   LABS REVIEWED PREVIOUS   06-25-19: wbc 11.8; hgb 11.2; hct 35.1; mcv 87. 5plt 247; glucose 120; bun 28; creat 1.72; k+ 3.8; na++ 140; ca 9.0; liver normal albumin 4.1 urine culture: proteus mirabilis 06-28-19; wbc 7.2; hgb 9.8; hct 30.5; mcv 88. 9plt 162; glucose 112; bun 16; creat 1.25; k+ 3.0; na++ 139; ca 7.9 liver normal albumin 2.8 mag 1.9 07-01-19: wbc 8.1; hgb 8.8; hct 27.4; mcv 88.1 plt 199; glucose 99; bun 13; creat 1.37; k+ 3.1; na++ 139; ca 7.7 mag 1.8 07-04-19: wbc 6.1; hgb 8.4; hct 27.0; mcv 88.2 plt 206; glucose 100; bun  10; creat 1.05 ;k+ 3.8; na++ 140; ca 7.9 mag 1.8  07-07-19: guaiac +  07-09-19: guaiac + 07-11-19: wbc 6.6; hgb 8.1; hct 25.5; mcv 87.6 plt 186; glucose 89; bun 20; creat 1.69; k+ 3.1; na++ 140; ca 8.1 liver normal albumin 2.8   NO NEW LABS.   Review of Systems  Constitutional: Negative for malaise/fatigue.  Respiratory: Negative for cough and shortness of breath.   Cardiovascular: Negative for chest pain, palpitations and leg swelling.  Gastrointestinal: Negative for abdominal pain, constipation and heartburn.  Musculoskeletal: Negative for back pain, joint pain and myalgias.  Skin: Negative.   Neurological: Negative for dizziness.  Psychiatric/Behavioral: The patient is not nervous/anxious.     Physical Exam Constitutional:      General: He is not in acute distress.    Appearance: He is well-developed. He is obese. He is not diaphoretic.  Neck:     Thyroid: Thyromegaly present.  Cardiovascular:     Rate and Rhythm: Normal rate and regular rhythm.     Heart sounds: Normal heart sounds.  Pulmonary:     Effort: Pulmonary effort is normal. No respiratory distress.     Breath sounds: Normal breath sounds.  Abdominal:     General: Bowel sounds are normal. There is no distension.     Palpations: Abdomen is soft.     Tenderness: There is no abdominal tenderness.     Comments: 06-28-19: small bowel resection; lysis of adhesions; ventral hernia repair with mesh      Genitourinary:    Comments: Foley  Musculoskeletal:        General: Normal range of motion.     Cervical back: Neck supple.     Right lower leg: No edema.     Left lower leg: No edema.  Lymphadenopathy:     Cervical: No cervical adenopathy.  Skin:    General: Skin is warm and dry.  Neurological:     Mental Status: He is alert and oriented to person, place, and time.  Psychiatric:  Mood and Affect: Mood normal.       ASSESSMENT/ PLAN:  TODAY  1. Small bowel obstruction/ventral hernia with obstruction: is  stable is status post bowel resection lysis of adhesions ventral hernia repair with mesh: will continue robaxin 750 mg every 8 hours as needed   2. PAF (paroxysmal atrial fibrillation) heart rate is stable will continue amiodarone 2000 mg daily and lopressor 50 mg twice daily for rate control is off eliquis due heme+ stools   3. AKI (acute kidney injury) is stable bun 20; creat 1.69 will monitor    PREVIOUS   4. CAD s/p percutaneous coronary angioplasty: is stable will continue lopressor 50 mg twice daily and has prn ntg   5. Essential hypertension: is stable b/p 116/67 will continue lopressor 50 mg twice daily   6. GERD without esophagitis: is stable will continue protonix 40 mg daily   7. Hyperthyroidism: is stable will continue tapazole 20 mg twice daily   8. Benign prostatic hypertrophy with urinary obstruction: is stable has long term foley; will continue proscar 5 mg daily and flomax 0.4 mg daily   9. Protein calorie malnutrition severe: is without change albumin 2.8 will continue supplements as directed   10. Bilateral  lower extremity edema: is stable will continue lasix 40 mg daily   11. Hypokalemia: is stable k+ 3.1 will continue k+ 10 meq daily   12. Mixed hyperlipidemia: is stable will continue pravachol 10 mg daily   13. Acute blood loss anemia: is stable hgb 8.1 will monitor       MD is aware of resident's narcotic use and is in agreement with current plan of care. We will attempt to wean resident as appropriate.  Ok Edwards NP [facility]  Contact [phone] Monday through Friday 8am- 5pm  After hours call [phone]

## 2019-07-12 NOTE — Progress Notes (Signed)
Primary Care Physician:  Celene Squibb, MD  Primary Gastroenterologist:  Garfield Cornea, MD   Chief Complaint  Patient presents with  . Blood In Stools    stool is dark    HPI:  Derek Foster is a 84 y.o. male with multiple medical issues including CAD/MI with stent, prior COVID infection in 01/2019, recurrent SBO from hernia, recent onset Afib presenting today at the request of Dr. Zigmund Daniel ([facility]) for further evaluation of hematemesis, heme positive stool while inpatient for SBO a few weeks ago. Patient underwent small bowel resection and primary repair of hernia on 06/28/19. H/O Afib and Eliquis was held for surgery.   Patient was hospitalized back in January with Covid.  Initially his hemoglobin was normal.  At time of discharge towards the end of February his hemoglobin was 7.9.  At time of this admission on June.  His hemoglobin was 11.2.  Hemoglobin declined to 8.4 during that hospitalization (small bowel obstruction requiring surgery).  Yesterday hemoglobin 8.1.  Last colonoscopy August 2004.  7 to 8 mm polyp removed from sigmoid colon, another small polyp in the ascending colon was coagulated.  Today patient reports he was having black stools after he first went to [facility]. Now dark but not black. No red blood in stool. No abdominal pain. No N/V. Feeling much better. BM 2-3 per day. Some are soft/some hard. Stool softener twice per day. No heartburn. No dysphagia. Large bolus may be hard to swallow due to goiter. Sometimes larger pills give problems, has to break in to.   Patient notes he has significant gross hematuria in 02/2019 while inpatient. Sees cardiology next month for follow up.   Eliquis currently no longer on medication list.   Current Outpatient Medications on File Prior to Visit  Medication Sig Dispense Refill  . amiodarone (PACERONE) 200 MG tablet Take 1 tablet (200 mg total) by mouth daily. 30 tablet 0  . Balsam [place]-Castor Oil (VENELEX) OINT Apply  topically. Special Instructions: Apply to sacrum, coccyx and bilateral buttocks qshift for prevention. Every Shift Day, Evening, Night    . bisacodyl (DULCOLAX) 10 MG suppository Place 1 suppository (10 mg total) rectally daily as needed for severe constipation. 12 suppository 0  . Coenzyme Q10 200 MG capsule Take 200 mg by mouth daily.      Marland Kitchen docusate sodium (COLACE) 100 MG capsule Take 1 capsule (100 mg total) by mouth 2 (two) times daily. 10 capsule 0  . finasteride (PROSCAR) 5 MG tablet Take 1 tablet (5 mg total) by mouth daily. 30 tablet 0  . fluticasone (FLONASE ALLERGY RELIEF) 50 MCG/ACT nasal spray Place 1 spray into both nostrils at bedtime.    . furosemide (LASIX) 20 MG tablet Take 2 tablets (40 mg total) by mouth daily. 60 tablet 0  . guaiFENesin (MUCINEX) 600 MG 12 hr tablet Take 1 tablet (600 mg total) by mouth 2 (two) times daily for 10 days. 20 tablet 0  . methimazole (TAPAZOLE) 10 MG tablet Take 2 tablets (20 mg total) by mouth 2 (two) times daily. 120 tablet 0  . methocarbamol (ROBAXIN) 750 MG tablet Take 1 tablet (750 mg total) by mouth every 8 (eight) hours as needed for muscle spasms. 10 tablet 0  . metoprolol tartrate (LOPRESSOR) 100 MG tablet Take 0.5 tablets (50 mg total) by mouth 2 (two) times daily. 30 tablet 0  . Multiple Vitamin (MULTIVITAMIN) tablet Take 1 tablet by mouth daily.      . nitroGLYCERIN (NITROSTAT)  0.4 MG SL tablet Place 1 tablet (0.4 mg total) under the tongue every 5 (five) minutes as needed. 25 tablet 0  . NON FORMULARY Diet - NAS    . Nutritional Supplements (ENSURE CLEAR) LIQD Take 237 mLs by mouth daily.    Marland Kitchen ofloxacin (OCUFLOX) 0.3 % ophthalmic solution Place 1 drop into both eyes 4 (four) times daily.     . ondansetron (ZOFRAN) 4 MG tablet Take 1 tablet (4 mg total) by mouth every 6 (six) hours as needed for nausea or vomiting. 20 tablet 0  . pantoprazole (PROTONIX) 40 MG tablet Take 40 mg by mouth daily.    . polyethylene glycol (MIRALAX /  GLYCOLAX) 17 g packet Take 17 g by mouth daily as needed for mild constipation. 14 each 0  . potassium chloride (KLOR-CON) 10 MEQ tablet Take 1 tablet (10 mEq total) by mouth every evening. 30 tablet 0  . pravastatin (PRAVACHOL) 10 MG tablet Take 1 tablet (10 mg total) by mouth daily at 6 PM. 30 tablet 0  . Probiotic Product (RISA-BID PROBIOTIC) TABS Take 1 tablet by mouth 2 (two) times daily.    Marland Kitchen senna-docusate (SENOKOT-S) 8.6-50 MG tablet Take 1 tablet by mouth 2 (two) times daily.    . tamsulosin (FLOMAX) 0.4 MG CAPS capsule Take 1 capsule (0.4 mg total) by mouth daily after breakfast. 30 capsule 0  . [DISCONTINUED] simvastatin (ZOCOR) 20 MG tablet Take 1 tablet (20 mg total) by mouth every evening. 30 tablet 11   No current facility-administered medications on file prior to visit.    No current outpatient medications on file.   No current facility-administered medications for this visit.    Allergies as of 07/12/2019 - Review Complete 07/12/2019  Allergen Reaction Noted  . Indomethacin Anaphylaxis 05/26/2011  . Iodine-131 Anaphylaxis 05/26/2011    Past Medical History:  Diagnosis Date  . CAD (coronary artery disease)    a. cath 01/27/2009 : s/p promus DES to LAD and medical managment of 70-80% mRCA  . COVID-19   . Essential hypertension   . Hypothyroidism   . Mixed hyperlipidemia   . NSTEMI (non-ST elevated myocardial infarction) ([place]) 2011  . PAF (paroxysmal atrial fibrillation) ([place])     Past Surgical History:  Procedure Laterality Date  . BOWEL RESECTION N/A 06/28/2019   Procedure: SMALL BOWEL RESECTION;  Surgeon: Virl Cagey, MD;  Location: [facility];  Service: General;  Laterality: N/A;  . CHOLECYSTECTOMY    . CORONARY STENT PLACEMENT    . CYSTOSCOPY/RETROGRADE/URETEROSCOPY Bilateral 03/09/2019   Procedure: CYSTOSCOPY, Clot Evacuation, Fulguration;  Surgeon: Festus Aloe, MD;  Location: [place];  Service: Urology;  Laterality: Bilateral;  . HERNIA REPAIR    .  LYSIS OF ADHESION N/A 06/28/2019   Procedure: LYSIS OF ADHESION;  Surgeon: Virl Cagey, MD;  Location: [facility];  Service: General;  Laterality: N/A;  . VENTRAL HERNIA REPAIR N/A 06/28/2019   Procedure: HERNIA REPAIR VENTRAL ADULT WITH VICRYL MESH OVERLAY;  Surgeon: Virl Cagey, MD;  Location: [facility];  Service: General;  Laterality: N/A;    Family History  Problem Relation Age of Onset  . Heart attack Father   . Lung cancer Sister     Social History   Socioeconomic History  . Marital status: Married    Spouse name: Not on file  . Number of children: Not on file  . Years of education: Not on file  . Highest education level: Not on file  Occupational History  .  Occupation: Charity fundraiser  Tobacco Use  . Smoking status: Former Smoker    Quit date: 01/20/1957    Years since quitting: 62.5  . Smokeless tobacco: Never Used  Vaping Use  . Vaping Use: Never used  Substance and Sexual Activity  . Alcohol use: No  . Drug use: Never  . Sexual activity: Not on file  Other Topics Concern  . Not on file  Social History Narrative   Quit smoking about 62 years ago.   Social Determinants of Health   Financial Resource Strain:   . Difficulty of Paying Living Expenses:   Food Insecurity:   . Worried About Charity fundraiser in the Last Year:   . Arboriculturist in the Last Year:   Transportation Needs:   . Film/video editor (Medical):   Marland Kitchen Lack of Transportation (Non-Medical):   Physical Activity:   . Days of Exercise per Week:   . Minutes of Exercise per Session:   Stress:   . Feeling of Stress :   Social Connections:   . Frequency of Communication with Friends and Family:   . Frequency of Social Gatherings with Friends and Family:   . Attends Religious Services:   . Active Member of Clubs or Organizations:   . Attends Archivist Meetings:   Marland Kitchen Marital Status:   Intimate Partner Violence:   . Fear of Current or Ex-Partner:   . Emotionally Abused:   Marland Kitchen  Physically Abused:   . Sexually Abused:       ROS:  General: Negative for anorexia, weight loss, fever, chills,+ fatigue, weakness. Eyes: Negative for vision changes.  ENT: Negative for hoarseness, difficulty swallowing , nasal congestion. CV: Negative for chest pain, angina, palpitations, dyspnea on exertion, peripheral edema.  Respiratory: Negative for dyspnea at rest, dyspnea on exertion, cough, sputum, wheezing.  GI: See history of present illness. GU:  Negative for dysuria, hematuria, urinary incontinence, urinary frequency, nocturnal urination.  MS: Negative for joint pain, low back pain.  Derm: Negative for rash or itching.  Neuro: Negative for weakness, abnormal sensation, seizure, frequent headaches, memory loss, confusion.  Psych: Negative for anxiety, depression, suicidal ideation, hallucinations.  Endo: Negative for unusual weight change.  Heme: Negative for bruising or bleeding. Allergy: Negative for rash or hives.    Physical Examination:  BP (!) 155/56   Pulse (!) 55   Temp (!) 97.1 F (36.2 C) (Oral)   Ht 6' (1.829 m)   Wt 267 lb (121.1 kg)   BMI 36.21 kg/m    General: Well-nourished, well-developed in no acute distress. Accompanied by son. Head: Normocephalic, atraumatic.   Eyes: Conjunctiva pink, no icterus. Mouth: masked Neck: Supple without thyromegaly, masses, or lymphadenopathy.  Lungs: Clear to auscultation bilaterally.  Heart: Regular rate and rhythm, no murmurs rubs or gallops.  Abdomen: Bowel sounds are normal, nontender, nondistended, no hepatosplenomegaly or masses, no abdominal bruits or    hernia , no rebound or guarding.   Rectal: not performed Extremities: trace bilateral lower extremity edema. No clubbing or deformities.  Neuro: Alert and oriented x 4 , grossly normal neurologically.  Skin: Warm and dry, no rash or jaundice.   Psych: Alert and cooperative, normal mood and affect.  Labs: Lab Results  Component Value Date   CREATININE  1.69 (H) 07/11/2019   BUN 20 07/11/2019   NA 140 07/11/2019   K 3.2 (L) 07/14/2019   CL 107 07/11/2019   CO2 25 07/11/2019   Lab Results  Component Value Date   WBC 6.6 07/11/2019   HGB 8.4 (L) 07/14/2019   HCT 26.6 (L) 07/14/2019   MCV 87.6 07/11/2019   PLT 186 07/11/2019   Lab Results  Component Value Date   CREATININE 1.69 (H) 07/11/2019   BUN 20 07/11/2019   NA 140 07/11/2019   K 3.1 (L) 07/11/2019   CL 107 07/11/2019   CO2 25 07/11/2019   Lab Results  Component Value Date   IRON 46 03/10/2019   TIBC 206 (L) 03/10/2019   FERRITIN 38 07/14/2019   No results found for: VITAMINB12 No results found for: FOLATE   Imaging Studies: CT ABDOMEN PELVIS WO CONTRAST  Result Date: 06/25/2019 CLINICAL DATA:  Feculent vomiting for 2 days, low-grade fever, cramping, lower abdominal pain, history hypertension, coronary artery disease post MI EXAM: CT ABDOMEN AND PELVIS WITHOUT CONTRAST TECHNIQUE: Multidetector CT imaging of the abdomen and pelvis was performed following the standard protocol without IV contrast. Sagittal and coronal MPR images reconstructed from axial data set. No oral contrast was administered. COMPARISON:  02/12/2019 FINDINGS: Lower chest: Mild bibasilar atelectasis Hepatobiliary: Gallbladder surgically absent.  Liver unremarkable. Pancreas: Normal appearance Spleen: Normal appearance Adrenals/Urinary Tract: Small peripelvic cysts LEFT kidney. Tiny nonobstructing LEFT renal calculus. Adrenal glands, kidneys, ureters, and bladder normal appearance Stomach/Bowel: Appendix not visualized, no pericecal inflammatory process seen. Colon decompressed. Stomach unremarkable. Dilated proximal and decompressed distal small bowel loops consistent with small bowel obstruction. Obstruction is secondary to a small bowel loop within a LEFT infra umbilical hernia. Mild bowel wall thickening of the associated small bowel loop. Associated diffuse stranding of the small bowel mesentery  increased from prior study. Distal small bowel loops unremarkable. Vascular/Lymphatic: Atherosclerotic calcifications aorta, iliac arteries, coronary arteries. Aorta normal caliber. No adenopathy. Reproductive: Foley catheter balloon is inflated within the inferior prostate gland, recommend deflating and advancing. Mild prostatic enlargement gland measuring 5.3 x 5.3 x 6.4 cm (volume = 94 cm^3). Seminal vesicles unremarkable. Other: In addition to LEFT infraumbilical hernia containing obstructed small bowel loop, a small LEFT supraumbilical ventral hernia is seen containing fat. No free air or free fluid. Musculoskeletal: Diffuse osseous demineralization. Mild degenerative changes of the thoracolumbar spine. IMPRESSION: Mid small bowel obstruction secondary to herniation of a small bowel loop into a LEFT infraumbilical ventral hernia, associated with mild bowel wall thickening but no evidence of perforation. Additional small LEFT supraumbilical ventral hernia containing fat. Foley catheter balloon is inflated within the inferior prostate gland; recommend replacement. Prostatic enlargement. Atherosclerotic calcifications including coronary arteries. Aortic Atherosclerosis (ICD10-I70.0). Findings called to Dr. Roderic Palau On 06/25/2019 at 1723 hrs. Electronically Signed   By: Lavonia Dana M.D.   On: 06/25/2019 17:25   DG Chest 1 View  Result Date: 07/02/2019 CLINICAL DATA:  Chest congestion. EXAM: CHEST  1 VIEW COMPARISON:  06/30/2019; 06/25/2019 FINDINGS: Grossly unchanged borderline enlarged cardiac silhouette and mediastinal contours with atherosclerotic plaque within the aortic arch. Mild pulmonary venous congestion without frank evidence of edema. Previously questioned right lower lung nodular opacities are less conspicuous on the present examination. No discrete focal airspace opacities. No pleural effusion or pneumothorax. No acute osseous abnormalities. IMPRESSION: 1. Similar findings of borderline cardiomegaly  and pulmonary venous congestion without definitive superimposed acute cardiopulmonary disease. 2. Questioned right lower lung nodular opacities are less conspicuous on present examination. Electronically Signed   By: Sandi Mariscal M.D.   On: 07/02/2019 06:11   DG Abd 1 View  Result Date: 06/27/2019 CLINICAL DATA:  Small-bowel obstruction, NGT removal yesterday  EXAM: ABDOMEN - 1 VIEW COMPARISON:  Radiograph 06/26/2019, CT 06/25/2019 FINDINGS: Limited assessment for free air on supine radiography. There are several air filled loops of small bowel in the mid abdomen which are upper limits normal caliber though overall degree of distention is decreased from 1 day prior. Few air-filled loops of colon noted as well, nonspecific. Cholecystectomy clips in the right upper quadrant. No suspicious calcifications. Extensive degenerative changes in the spine, hips and pelvis are unchanged from comparison. No acute osseous abnormality. IMPRESSION: 1. Limited assessment for free air on supine radiography. 2. Several air filled loops of small bowel in the mid abdomen which are upper limits normal caliber though overall degree of distention is decreased from 1 day prior. Findings may reflect resolving small bowel obstruction or ileus. Electronically Signed   By: Lovena Le M.D.   On: 06/27/2019 05:16   DG Abd 1 View  Result Date: 06/26/2019 CLINICAL DATA:  Abdominal pain, small-bowel obstruction EXAM: ABDOMEN - 1 VIEW COMPARISON:  CT abdomen pelvis, 06/25/2019 FINDINGS: Single supine abdominal radiograph excludes the upper abdomen and portions of the left and right lateral abdomen. Within this limitation, gas-filled, nondistended loops of small bowel in the included central abdomen, with scattered gas present in the portions of the included colon to the rectum. IMPRESSION: Single supine abdominal radiograph excludes the upper abdomen and portions of the left and right lateral abdomen. Within this limitation, gas-filled,  nondistended loops of small bowel in the included central abdomen, with scattered gas present in the portions of the included colon to the rectum. This examination suggests significant resolution of previously noted small bowel obstruction. Electronically Signed   By: Eddie Candle M.D.   On: 06/26/2019 12:41   DG CHEST PORT 1 VIEW  Result Date: 06/30/2019 CLINICAL DATA:  Cough. EXAM: PORTABLE CHEST 1 VIEW COMPARISON:  June 25, 2019 FINDINGS: The enteric tube extends below the left hemidiaphragm. The heart size is stable. Aortic calcifications are noted. There is no significant pleural effusion. There is a possible developing infiltrate in the medial right lower lung zone. There are old healed left-sided rib fractures. There is no acute displaced fracture. IMPRESSION: Possible new infiltrate in the medial right lower lung zone. Electronically Signed   By: Constance Holster M.D.   On: 06/30/2019 03:47   DG Chest Portable 1 View  Result Date: 06/25/2019 CLINICAL DATA:  Advancement of nasogastric tube. EXAM: PORTABLE CHEST 1 VIEW COMPARISON:  Earlier this day. FINDINGS: Tip of the enteric tube is below the diaphragm in the stomach in the left upper quadrant. The side-port is not well visualized, but likely below the diaphragm. Low lung volumes. Cardiomegaly is unchanged. No focal airspace disease. IMPRESSION: Tip of the enteric tube below the diaphragm in the stomach in the left upper quadrant. Electronically Signed   By: Keith Rake M.D.   On: 06/25/2019 21:22   DG Chest Portable 1 View  Result Date: 06/25/2019 CLINICAL DATA:  NGT placement EXAM: PORTABLE CHEST 1 VIEW COMPARISON:  04/11/2019 FINDINGS: Cardiomegaly. Esophagogastric tube is positioned with tip over the lower esophagus, although poorly visualized due to underpenetration. Both lungs are clear. The visualized skeletal structures are unremarkable. IMPRESSION: Esophagogastric tube is positioned with tip over the lower esophagus, although poorly  visualized due to underpenetration. Recommend advancement to ensure subdiaphragmatic positioning of tip and side port. Electronically Signed   By: Eddie Candle M.D.   On: 06/25/2019 20:15    Impression/Plan:  84 y/o male with several medical issues/multiple hospitalizations  this year (Covid, Afib, chronic indwelling catheter, SBO with resection/hernia repair on June 8th) presenting for anemia, melena, heme positive stool. Had episode of hematemesis prior to recent SBO surgery. Eliquis has been placed on hold. Ddx includes M-W tear, PUD, gastritis, esophagitis, malignancy.   Have recommended EGD for evaluation. Patient is somewhat reluctant to rush into EGD due to recent surgery and numerous hospitalizations this year. He wants to focus on his rehab and consider EGD in the upcoming weeks. We will get him on the schedule. Monitor for recurrent melena or drop in H/H. Increase his pantoprazole to 40mg  twice daily before meals. Repeat labs including CBC, iron/tibc/ferritin next week.   EGD in near future. ASA II,  I have discussed the risks, alternatives, benefits with regards to but not limited to the risk of reaction to medication, bleeding, infection, perforation and the patient is agreeable to proceed. Written consent to be obtained.

## 2019-07-14 ENCOUNTER — Other Ambulatory Visit (HOSPITAL_COMMUNITY)
Admission: RE | Admit: 2019-07-14 | Discharge: 2019-07-14 | Disposition: A | Discharge status: Home / Self Care | Payer: PPO | Source: Skilled Nursing Facility | Attending: Adult Health | Admitting: Adult Health

## 2019-07-14 DIAGNOSIS — D649 Anemia, unspecified: Secondary | ICD-10-CM | POA: Insufficient documentation

## 2019-07-14 LAB — HEMOGLOBIN AND HEMATOCRIT, BLOOD
HCT: 26.6 % — ABNORMAL LOW (ref 39.0–52.0)
Hemoglobin: 8.4 g/dL — ABNORMAL LOW (ref 13.0–17.0)

## 2019-07-14 LAB — FERRITIN: Ferritin: 38 ng/mL (ref 24–336)

## 2019-07-14 LAB — POTASSIUM: Potassium: 3.2 mmol/L — ABNORMAL LOW (ref 3.5–5.1)

## 2019-07-15 DIAGNOSIS — K921 Melena: Secondary | ICD-10-CM | POA: Insufficient documentation

## 2019-07-16 DIAGNOSIS — D62 Acute posthemorrhagic anemia: Secondary | ICD-10-CM | POA: Insufficient documentation

## 2019-07-17 ENCOUNTER — Telehealth: Payer: Self-pay | Admitting: Gastroenterology

## 2019-07-17 NOTE — Telephone Encounter (Signed)
Patient seen in the office this week for melena, heme positive stool, hematemesis, anemia. He had recent small bowel obstruction surgery and hernia repair. Patient did not want asap procedure. H/H stable in 8 range.   Please schedule his for EGD with RMR for above reasons. ASA II. Can be with conscious sedation or propofol depending on availability.   He was not on eliquis when I saw him but please provide orders to hold eliquis for 48 hours before EGD.   Patient currently at [facility] for rehab. You can call him on cell or his son [place].

## 2019-07-18 ENCOUNTER — Encounter: Payer: Self-pay | Admitting: Adult Health

## 2019-07-18 LAB — HEMOGLOBIN AND HEMATOCRIT, BLOOD
HCT: 27.2 % — ABNORMAL LOW (ref 39.0–52.0)
Hemoglobin: 8.7 g/dL — ABNORMAL LOW (ref 13.0–17.0)

## 2019-07-18 LAB — POTASSIUM: Potassium: 3.3 mmol/L — ABNORMAL LOW (ref 3.5–5.1)

## 2019-07-18 NOTE — Telephone Encounter (Signed)
Noted. Added to list to be called

## 2019-07-19 ENCOUNTER — Other Ambulatory Visit: Payer: Self-pay | Admitting: Adult Health

## 2019-07-19 ENCOUNTER — Encounter: Payer: Self-pay | Admitting: Adult Health

## 2019-07-19 ENCOUNTER — Non-Acute Institutional Stay (SKILLED_NURSING_FACILITY): Payer: PPO | Admitting: Adult Health

## 2019-07-19 DIAGNOSIS — K56609 Unspecified intestinal obstruction, unspecified as to partial versus complete obstruction: Secondary | ICD-10-CM

## 2019-07-19 DIAGNOSIS — I482 Chronic atrial fibrillation, unspecified: Secondary | ICD-10-CM

## 2019-07-19 DIAGNOSIS — E782 Mixed hyperlipidemia: Secondary | ICD-10-CM

## 2019-07-19 DIAGNOSIS — E43 Unspecified severe protein-calorie malnutrition: Secondary | ICD-10-CM | POA: Diagnosis not present

## 2019-07-19 MED ORDER — AMIODARONE HCL 200 MG PO TABS: 200.0000 mg | ORAL_TABLET | Freq: Every day | ORAL | 0 refills | Status: DC

## 2019-07-19 MED ORDER — NITROGLYCERIN 0.4 MG SL SUBL: 0.4000 mg | SUBLINGUAL_TABLET | SUBLINGUAL | 0 refills | Status: DC | PRN

## 2019-07-19 MED ORDER — POTASSIUM CHLORIDE CRYS ER 10 MEQ PO TBCR: 10.0000 meq | EXTENDED_RELEASE_TABLET | Freq: Every evening | ORAL | 0 refills | Status: DC

## 2019-07-19 MED ORDER — METHIMAZOLE 10 MG PO TABS: 20.0000 mg | ORAL_TABLET | Freq: Two times a day (BID) | ORAL | 0 refills | Status: DC

## 2019-07-19 MED ORDER — OFLOXACIN 0.3 % OP SOLN: 1.0000 [drp] | Freq: Four times a day (QID) | OPHTHALMIC | 0 refills | Status: DC

## 2019-07-19 MED ORDER — ONDANSETRON HCL 4 MG PO TABS: 4.0000 mg | ORAL_TABLET | Freq: Four times a day (QID) | ORAL | 0 refills | Status: DC | PRN

## 2019-07-19 MED ORDER — TAMSULOSIN HCL 0.4 MG PO CAPS: 0.4000 mg | ORAL_CAPSULE | Freq: Every day | ORAL | 0 refills | Status: DC

## 2019-07-19 MED ORDER — FUROSEMIDE 20 MG PO TABS: 40.0000 mg | ORAL_TABLET | Freq: Every day | ORAL | 0 refills | Status: DC

## 2019-07-19 MED ORDER — METOPROLOL TARTRATE 100 MG PO TABS: 50.0000 mg | ORAL_TABLET | Freq: Two times a day (BID) | ORAL | 0 refills | Status: DC

## 2019-07-19 MED ORDER — PANTOPRAZOLE SODIUM 40 MG PO TBEC: 40.0000 mg | DELAYED_RELEASE_TABLET | Freq: Every day | ORAL | 0 refills | Status: DC

## 2019-07-19 MED ORDER — FLUTICASONE PROPIONATE 50 MCG/ACT NA SUSP: 1.0000 | Freq: Every day | NASAL | 0 refills | Status: DC

## 2019-07-19 MED ORDER — METHOCARBAMOL 750 MG PO TABS: 750.0000 mg | ORAL_TABLET | Freq: Three times a day (TID) | ORAL | 0 refills | Status: DC | PRN

## 2019-07-19 MED ORDER — FINASTERIDE 5 MG PO TABS: 5.0000 mg | ORAL_TABLET | Freq: Every day | ORAL | 0 refills | Status: DC

## 2019-07-19 MED ORDER — PRAVASTATIN SODIUM 10 MG PO TABS: 10.0000 mg | ORAL_TABLET | Freq: Every day | ORAL | 0 refills | Status: DC

## 2019-07-19 NOTE — Progress Notes (Signed)
Location:    [place] Room Number: 156/W Place of Service:  SNF (31)    CODE STATUS: DNR  Allergies  Allergen Reactions  . Indomethacin Anaphylaxis    But tolerates ibuprofen, aleve  . Iodine-131 Anaphylaxis    Chief Complaint  Patient presents with  . Discharge Note    Discharge Visit    HPI:  He is being discharged to home with home health for pt/ot. He will not need any dme. He will need his prescriptions written and will need to follow up with his medical provider. He had been hospitalized for sbo with ventral and surgical repair done. He was admitted to this facility for short term rehab. He had participated in both physical and occupational therapies and is ready for discharge home and will complete therapy on a home health basis.    Past Medical History:  Diagnosis Date  . AKI (acute kidney injury) ([place]) 02/12/2019  . CAD (coronary artery disease)    a. cath 01/27/2009 : s/p promus DES to LAD and medical managment of 70-80% mRCA  . COVID-19   . Essential hypertension   . Hypothyroidism   . Mixed hyperlipidemia   . NSTEMI (non-ST elevated myocardial infarction) ([place]) 2011  . PAF (paroxysmal atrial fibrillation) ([place])     Past Surgical History:  Procedure Laterality Date  . BOWEL RESECTION N/A 06/28/2019   Procedure: SMALL BOWEL RESECTION;  Surgeon: Virl Cagey, MD;  [facility]: [facility];  Service: General;  Laterality: N/A;  . CHOLECYSTECTOMY    . CORONARY STENT PLACEMENT    . CYSTOSCOPY/RETROGRADE/URETEROSCOPY Bilateral 03/09/2019   Procedure: CYSTOSCOPY, Clot Evacuation, Fulguration;  Surgeon: Festus Aloe, MD;  Location: [facility];  Service: Urology;  Laterality: Bilateral;  . HERNIA REPAIR    . LYSIS OF ADHESION N/A 06/28/2019   Procedure: LYSIS OF ADHESION;  Surgeon: Virl Cagey, MD;  Location: [facility];  Service: General;  Laterality: N/A;  . VENTRAL HERNIA REPAIR N/A 06/28/2019   Procedure: HERNIA REPAIR VENTRAL ADULT WITH  VICRYL MESH OVERLAY;  Surgeon: Virl Cagey, MD;  Location: [facility];  Service: General;  Laterality: N/A;    Social History   Socioeconomic History  . Marital status: Married    Spouse name: Not on file  . Number of children: Not on file  . Years of education: Not on file  . Highest education level: Not on file  Occupational History  . Occupation: Charity fundraiser  Tobacco Use  . Smoking status: Former Smoker    Quit date: 01/20/1957    Years since quitting: 62.5  . Smokeless tobacco: Never Used  Vaping Use  . Vaping Use: Never used  Substance and Sexual Activity  . Alcohol use: No  . Drug use: Never  . Sexual activity: Not on file  Other Topics Concern  . Not on file  Social History Narrative   Quit smoking about 62 years ago.   Social Determinants of Health   Financial Resource Strain:   . Difficulty of Paying Living Expenses:   Food Insecurity:   . Worried About Charity fundraiser in the Last Year:   . Arboriculturist in the Last Year:   Transportation Needs:   . Film/video editor (Medical):   Marland Kitchen Lack of Transportation (Non-Medical):   Physical Activity:   . Days of Exercise per Week:   . Minutes of Exercise per Session:   Stress:   . Feeling of Stress :   Social  Connections:   . Frequency of Communication with Friends and Family:   . Frequency of Social Gatherings with Friends and Family:   . Attends Religious Services:   . Active Member of Clubs or Organizations:   . Attends Archivist Meetings:   Marland Kitchen Marital Status:   Intimate Partner Violence:   . Fear of Current or Ex-Partner:   . Emotionally Abused:   Marland Kitchen Physically Abused:   . Sexually Abused:    Family History  Problem Relation Age of Onset  . Heart attack Father   . Lung cancer Sister     VITAL SIGNS BP 125/69   Pulse 71   Temp 97.7 F (36.5 C) (Oral)   Resp 18   Ht 6' (1.829 m)   Wt 271 lb 8 oz (123.2 kg)   BMI 36.82 kg/m   Patient's Medications  New Prescriptions   No  medications on file  Previous Medications   AMIODARONE (PACERONE) 200 MG TABLET    Take 1 tablet (200 mg total) by mouth daily.   BALSAM PERU-CASTOR OIL (VENELEX) OINT    Apply topically. Special Instructions: Apply to sacrum, coccyx and bilateral buttocks qshift for prevention. Every Shift Day, Evening, Night   BISACODYL (DULCOLAX) 10 MG SUPPOSITORY    Place 1 suppository (10 mg total) rectally daily as needed for severe constipation.   COENZYME Q10 200 MG CAPSULE    Take 200 mg by mouth daily.     DOCUSATE SODIUM (COLACE) 100 MG CAPSULE    Take 1 capsule (100 mg total) by mouth 2 (two) times daily.   FINASTERIDE (PROSCAR) 5 MG TABLET    Take 1 tablet (5 mg total) by mouth daily.   FLUTICASONE (FLONASE ALLERGY RELIEF) 50 MCG/ACT NASAL SPRAY    Place 1 spray into both nostrils at bedtime.   FUROSEMIDE (LASIX) 20 MG TABLET    Take 2 tablets (40 mg total) by mouth daily.   METHIMAZOLE (TAPAZOLE) 10 MG TABLET    Take 2 tablets (20 mg total) by mouth 2 (two) times daily.   METHOCARBAMOL (ROBAXIN) 750 MG TABLET    Take 1 tablet (750 mg total) by mouth every 8 (eight) hours as needed for muscle spasms.   METOPROLOL TARTRATE (LOPRESSOR) 100 MG TABLET    Take 0.5 tablets (50 mg total) by mouth 2 (two) times daily.   MULTIPLE VITAMIN (MULTIVITAMIN) TABLET    Take 1 tablet by mouth daily.     NITROGLYCERIN (NITROSTAT) 0.4 MG SL TABLET    Place 1 tablet (0.4 mg total) under the tongue every 5 (five) minutes as needed.   NON FORMULARY    Diet - NAS   NUTRITIONAL SUPPLEMENTS (ENSURE CLEAR) LIQD    Take 237 mLs by mouth daily.   OFLOXACIN (OCUFLOX) 0.3 % OPHTHALMIC SOLUTION    Place 1 drop into both eyes 4 (four) times daily.    ONDANSETRON (ZOFRAN) 4 MG TABLET    Take 1 tablet (4 mg total) by mouth every 6 (six) hours as needed for nausea or vomiting.   PANTOPRAZOLE (PROTONIX) 40 MG TABLET    Take 40 mg by mouth daily.   POLYETHYLENE GLYCOL (MIRALAX / GLYCOLAX) 17 G PACKET    Take 17 g by mouth daily as  needed for mild constipation.   POTASSIUM CHLORIDE (KLOR-CON) 10 MEQ TABLET    Take 1 tablet (10 mEq total) by mouth every evening.   PRAVASTATIN (PRAVACHOL) 10 MG TABLET    Take 1 tablet (10 mg  total) by mouth daily at 6 PM.   SENNA-DOCUSATE (SENOKOT-S) 8.6-50 MG TABLET    Take 1 tablet by mouth 2 (two) times daily.   TAMSULOSIN (FLOMAX) 0.4 MG CAPS CAPSULE    Take 1 capsule (0.4 mg total) by mouth daily after breakfast.  Modified Medications   No medications on file  Discontinued Medications   No medications on file     SIGNIFICANT DIAGNOSTIC EXAMS   PREVIOUS   06-25-19: ct of abdomen and pelvis: Mid small bowel obstruction secondary to herniation of a small bowel loop into a LEFT infraumbilical ventral hernia, associated with mild bowel wall thickening but no evidence of perforation. Additional small LEFT supraumbilical ventral hernia containing fat. Foley catheter balloon is inflated within the inferior prostate gland; recommend replacement. Prostatic enlargement. Atherosclerotic calcifications including coronary arteries. Aortic Atherosclerosis   06-30-19: chest x-ray: Possible new infiltrate in the medial right lower lung zone.   NO NEW EXAMS.   LABS REVIEWED PREVIOUS   06-25-19: wbc 11.8; hgb 11.2; hct 35.1; mcv 87. 5plt 247; glucose 120; bun 28; creat 1.72; k+ 3.8; na++ 140; ca 9.0; liver normal albumin 4.1 urine culture: proteus mirabilis 06-28-19; wbc 7.2; hgb 9.8; hct 30.5; mcv 88. 9plt 162; glucose 112; bun 16; creat 1.25; k+ 3.0; na++ 139; ca 7.9 liver normal albumin 2.8 mag 1.9 07-01-19: wbc 8.1; hgb 8.8; hct 27.4; mcv 88.1 plt 199; glucose 99; bun 13; creat 1.37; k+ 3.1; na++ 139; ca 7.7 mag 1.8 07-04-19: wbc 6.1; hgb 8.4; hct 27.0; mcv 88.2 plt 206; glucose 100; bun 10; creat 1.05 ;k+ 3.8; na++ 140; ca 7.9 mag 1.8  07-07-19: guaiac +  07-09-19: guaiac + 07-11-19: wbc 6.6; hgb 8.1; hct 25.5; mcv 87.6 plt 186; glucose 89; bun 20; creat 1.69; k+ 3.1; na++ 140; ca 8.1 liver  normal albumin 2.8   NO NEW LABS.   Review of Systems  Constitutional: Negative for malaise/fatigue.  Respiratory: Negative for cough and shortness of breath.   Cardiovascular: Negative for chest pain, palpitations and leg swelling.  Gastrointestinal: Negative for abdominal pain, constipation and heartburn.  Musculoskeletal: Negative for back pain, joint pain and myalgias.  Skin: Negative.   Neurological: Negative for dizziness.  Psychiatric/Behavioral: The patient is not nervous/anxious.    Physical Exam Constitutional:      General: He is not in acute distress.    Appearance: He is well-developed. He is obese. He is not diaphoretic.  Neck:     Thyroid: Thyromegaly present.  Cardiovascular:     Rate and Rhythm: Normal rate and regular rhythm.     Heart sounds: Normal heart sounds.  Pulmonary:     Effort: Pulmonary effort is normal. No respiratory distress.     Breath sounds: Normal breath sounds.  Abdominal:     General: Bowel sounds are normal. There is no distension.     Palpations: Abdomen is soft.     Tenderness: There is no abdominal tenderness.     Comments: 06-28-19: small bowel resection; lysis of adhesions; ventral hernia repair with mesh       Genitourinary:    Comments: Foley  Musculoskeletal:        General: Normal range of motion.     Right lower leg: No edema.     Left lower leg: No edema.  Lymphadenopathy:     Cervical: No cervical adenopathy.  Skin:    General: Skin is warm and dry.  Neurological:     Mental Status: He is alert and oriented  to person, place, and time.  Psychiatric:        Mood and Affect: Mood normal.       ASSESSMENT/ PLAN:   Patient is being discharged with the following home health services:  Pt/ot to evaluate and treat as indicated for gait balance strength adl training.   Patient is being discharged with the following durable medical equipment:  None needed   Patient has been advised to f/u with their PCP in 1-2 weeks to  bring them up to date on their rehab stay.  Social services at facility was responsible for arranging this appointment.  Pt was provided with a 30 day supply of prescriptions for medications and refills must be obtained from their PCP.  For controlled substances, a more limited supply may be provided adequate until PCP appointment only.   A 30 day supply of his prescription medications have been sent to [facility]   Time spent with patient 35 minutes: dme; home health medications.   Ok Edwards NP [facility]  Contact [phone] Monday through Friday 8am- 5pm  After hours call [phone]

## 2019-07-22 DIAGNOSIS — E43 Unspecified severe protein-calorie malnutrition: Secondary | ICD-10-CM | POA: Diagnosis not present

## 2019-07-22 DIAGNOSIS — N138 Other obstructive and reflux uropathy: Secondary | ICD-10-CM | POA: Diagnosis not present

## 2019-07-22 DIAGNOSIS — D62 Acute posthemorrhagic anemia: Secondary | ICD-10-CM | POA: Diagnosis not present

## 2019-07-22 DIAGNOSIS — Z48815 Encounter for surgical aftercare following surgery on the digestive system: Secondary | ICD-10-CM | POA: Diagnosis not present

## 2019-07-22 DIAGNOSIS — R600 Localized edema: Secondary | ICD-10-CM | POA: Diagnosis not present

## 2019-07-22 DIAGNOSIS — E039 Hypothyroidism, unspecified: Secondary | ICD-10-CM | POA: Diagnosis not present

## 2019-07-22 DIAGNOSIS — I252 Old myocardial infarction: Secondary | ICD-10-CM | POA: Diagnosis not present

## 2019-07-22 DIAGNOSIS — N401 Enlarged prostate with lower urinary tract symptoms: Secondary | ICD-10-CM | POA: Diagnosis not present

## 2019-07-22 DIAGNOSIS — I480 Paroxysmal atrial fibrillation: Secondary | ICD-10-CM | POA: Diagnosis not present

## 2019-07-22 DIAGNOSIS — Z466 Encounter for fitting and adjustment of urinary device: Secondary | ICD-10-CM | POA: Diagnosis not present

## 2019-07-22 DIAGNOSIS — E876 Hypokalemia: Secondary | ICD-10-CM | POA: Diagnosis not present

## 2019-07-22 DIAGNOSIS — I10 Essential (primary) hypertension: Secondary | ICD-10-CM | POA: Diagnosis not present

## 2019-07-22 DIAGNOSIS — I2510 Atherosclerotic heart disease of native coronary artery without angina pectoris: Secondary | ICD-10-CM | POA: Diagnosis not present

## 2019-07-26 ENCOUNTER — Telehealth: Payer: Self-pay | Admitting: Cardiovascular Disease

## 2019-07-26 ENCOUNTER — Telehealth: Payer: Self-pay | Admitting: *Deleted

## 2019-07-26 ENCOUNTER — Ambulatory Visit: Payer: PPO | Admitting: Cardiology

## 2019-07-26 NOTE — Telephone Encounter (Signed)
Pt c/o medication issue:  1. Name of Medication: diltiazem (CARDIZEM CD) 180 MG 24 hr capsule  2. How are you currently taking this medication (dosage and times per day)? Not currently taking   3. Are you having a reaction (difficulty breathing--STAT)? No   4. What is your medication issue? Derek Foster was on this medication when admitted in the hospital for his procedure around June 7th. Derek Foster states upon being released it was no longer on his current med list to continue taking. Derek Foster just wanted to verify that Derek Foster does want Derek Foster off this medication and that he should no longer be taking it. Please advise.

## 2019-07-26 NOTE — Telephone Encounter (Signed)
According to discharge summary from the surgery on June 7th-  STOP taking these medications       digoxin 0.125 MG tablet Commonly known as: LANOXIN    diltiazem 180 MG 24 hr capsule Commonly known as: CARDIZEM CD   They wanted to make sure Dr.[place] was aware and this was okay.  Will route to MD to make aware.

## 2019-07-26 NOTE — Telephone Encounter (Signed)
Called pt to schedule EGD with RMR. He is scheduled for 8/4 at 9:30am. Aware he needs to arrive at 8:30am. He also needs covid test done. Scheduled for 8/3 at 8:00am. Patient reports he is no longer at the [facility] (left last week). Confirmed mailing address [place]. Advised will mail everything to him.

## 2019-07-27 DIAGNOSIS — R972 Elevated prostate specific antigen [PSA]: Secondary | ICD-10-CM | POA: Diagnosis not present

## 2019-07-27 DIAGNOSIS — E0590 Thyrotoxicosis, unspecified without thyrotoxic crisis or storm: Secondary | ICD-10-CM | POA: Diagnosis not present

## 2019-07-27 DIAGNOSIS — E039 Hypothyroidism, unspecified: Secondary | ICD-10-CM | POA: Diagnosis not present

## 2019-07-27 DIAGNOSIS — R7301 Impaired fasting glucose: Secondary | ICD-10-CM | POA: Diagnosis not present

## 2019-07-27 DIAGNOSIS — Z48816 Encounter for surgical aftercare following surgery on the genitourinary system: Secondary | ICD-10-CM | POA: Diagnosis not present

## 2019-07-27 DIAGNOSIS — I119 Hypertensive heart disease without heart failure: Secondary | ICD-10-CM | POA: Diagnosis not present

## 2019-07-27 DIAGNOSIS — N401 Enlarged prostate with lower urinary tract symptoms: Secondary | ICD-10-CM | POA: Diagnosis not present

## 2019-07-27 DIAGNOSIS — K219 Gastro-esophageal reflux disease without esophagitis: Secondary | ICD-10-CM | POA: Diagnosis not present

## 2019-07-27 DIAGNOSIS — Z0189 Encounter for other specified special examinations: Secondary | ICD-10-CM | POA: Diagnosis not present

## 2019-07-27 DIAGNOSIS — I2510 Atherosclerotic heart disease of native coronary artery without angina pectoris: Secondary | ICD-10-CM | POA: Diagnosis not present

## 2019-07-27 DIAGNOSIS — I10 Essential (primary) hypertension: Secondary | ICD-10-CM | POA: Diagnosis not present

## 2019-07-27 DIAGNOSIS — Z6841 Body Mass Index (BMI) 40.0 and over, adult: Secondary | ICD-10-CM | POA: Diagnosis not present

## 2019-07-27 DIAGNOSIS — E46 Unspecified protein-calorie malnutrition: Secondary | ICD-10-CM | POA: Diagnosis not present

## 2019-07-27 DIAGNOSIS — E785 Hyperlipidemia, unspecified: Secondary | ICD-10-CM | POA: Diagnosis not present

## 2019-07-27 DIAGNOSIS — J90 Pleural effusion, not elsewhere classified: Secondary | ICD-10-CM | POA: Diagnosis not present

## 2019-07-27 DIAGNOSIS — E782 Mixed hyperlipidemia: Secondary | ICD-10-CM | POA: Diagnosis not present

## 2019-07-27 DIAGNOSIS — D649 Anemia, unspecified: Secondary | ICD-10-CM | POA: Diagnosis not present

## 2019-07-27 NOTE — Telephone Encounter (Signed)
He has appointment with Lurena Joiner on 07/30- would you like him seen sooner or is this appointment okay?   Thanks!

## 2019-07-27 NOTE — Telephone Encounter (Signed)
Looks like they were stopped in the hospital.  He needs follow up.  Continue to hold for now.  TCR

## 2019-07-27 NOTE — Telephone Encounter (Signed)
Called and LVM for patient and patient wife, advising of MD note.  Advised of upcoming appointment time, and to call back with questions.  Left call back number.

## 2019-07-27 NOTE — Telephone Encounter (Signed)
7/30 is fine.  Thanks.

## 2019-08-04 ENCOUNTER — Telehealth: Payer: Self-pay | Admitting: Internal Medicine

## 2019-08-04 NOTE — Telephone Encounter (Signed)
noted 

## 2019-08-04 NOTE — Telephone Encounter (Signed)
PATIENT CALLED AND SAID HE HAD NO MORE BLOOD IN HIS STOOL. SO HE WANTS TO CANCEL HIS PROCEDURE

## 2019-08-04 NOTE — Telephone Encounter (Signed)
Called pt, he wants to cancel procedure. Denied dark or black stools also. Informed endo scheduler to cancel procedure.   FYI to LSL.

## 2019-08-08 ENCOUNTER — Ambulatory Visit: Payer: PPO | Admitting: Gastroenterology

## 2019-08-19 ENCOUNTER — Other Ambulatory Visit: Payer: Self-pay

## 2019-08-19 ENCOUNTER — Encounter: Payer: Self-pay | Admitting: Cardiology

## 2019-08-19 ENCOUNTER — Ambulatory Visit (INDEPENDENT_AMBULATORY_CARE_PROVIDER_SITE_OTHER): Payer: PPO | Admitting: Cardiology

## 2019-08-19 VITALS — BP 162/64 | HR 56 | Ht 72.0 in | Wt 270.0 lb

## 2019-08-19 DIAGNOSIS — I10 Essential (primary) hypertension: Secondary | ICD-10-CM

## 2019-08-19 DIAGNOSIS — I2510 Atherosclerotic heart disease of native coronary artery without angina pectoris: Secondary | ICD-10-CM | POA: Diagnosis not present

## 2019-08-19 DIAGNOSIS — Z9861 Coronary angioplasty status: Secondary | ICD-10-CM | POA: Diagnosis not present

## 2019-08-19 DIAGNOSIS — D62 Acute posthemorrhagic anemia: Secondary | ICD-10-CM

## 2019-08-19 DIAGNOSIS — I480 Paroxysmal atrial fibrillation: Secondary | ICD-10-CM

## 2019-08-19 DIAGNOSIS — K56609 Unspecified intestinal obstruction, unspecified as to partial versus complete obstruction: Secondary | ICD-10-CM | POA: Diagnosis not present

## 2019-08-19 MED ORDER — METOPROLOL TARTRATE 25 MG PO TABS: 25.0000 mg | ORAL_TABLET | Freq: Two times a day (BID) | ORAL | 3 refills | Status: DC

## 2019-08-19 NOTE — Patient Instructions (Signed)
Medication Instructions:   Change Metoprolol Tartrate---take 25 mg (1 tablet) twice daily.  Stay off the Eliquis for now.  *If you need a refill on your cardiac medications before your next appointment, please call your pharmacy*   Follow-Up: At [facility], you and your health needs are our priority.  As part of our continuing mission to provide you with exceptional heart care, we have created designated Provider Care Teams.  These Care Teams include your primary Cardiologist (physician) and Advanced Practice Providers (APPs -  Physician Assistants and Nurse Practitioners) who all work together to provide you with the care you need, when you need it.  We recommend signing up for the patient portal called "MyChart".  Sign up information is provided on this After Visit Summary.  MyChart is used to connect with patients for Virtual Visits (Telemedicine).  Patients are able to view lab/test results, encounter notes, upcoming appointments, etc.  Non-urgent messages can be sent to your provider as well.   To learn more about what you can do with MyChart, go to [email or web address].    Your next appointment:   3 month(s)  The format for your next appointment:   In Person  Provider:   Skeet Latch, MD

## 2019-08-19 NOTE — Progress Notes (Signed)
Cardiology Office Note:    Date:  08/19/2019   ID:  Derek Foster, DOB 11-10-1935, MRN [id number]  PCP:  Celene Squibb, MD  Cardiologist:  Skeet Latch, MD  Electrophysiologist:  None   Referring MD: Celene Squibb, MD   No chief complaint on file.   History of Present Illness:    Derek Foster is a pleasant 84 y.o. male with a hx ofCAD. He had an anterior MI in 2014 treated with an LAD PCI. Other past medical history is remarkable forHTNand dyslipidemia. In January 2021 he was admitted with a small bowel obstruction and new onset atrial fibrillation in the setting of active COVID-19. He had recently lost his wife to COVID-19 prior to that admission. Cardiology was consulted. He required multiple medications for rate control and it turns out he was hyperthyroid. Echocardiogram during that admission revealed an ejection fraction of 60 to 65% with moderate LVH. He was discharged on amiodarone, Lanoxin, metoprolol 200 mg twice daily, and diltiazem 360 mg daily and Eliquis.  Dr Oval Linsey saw the patient in follow up in 03/16/2019 and decreased his Amiodarone to 200 mg daily. He was discharged to SNF then but eventually was able to return home.  I saw him as a virtual f/u 04/07/2019. His heart rate was occasionally in the 50's and I decreased his Metoprolol to 100 mg BID and stopped his Lanoxin. The plan was for two week f/u but he ended up back in the ED 04/11/2019 with multiple complaints. He was diagnosed with M/S back pain, UTI, and suspected pneumonia. He was discharged back to [facility] 04/14/2019. His EKG 04/14/2019 showed NSR 60.  I saw him as a virtual visit 04/30/2019. I I cut his Diltiazem back to 180 mg daily. He was then admitted again with SBO 06/27/2019.  He had surgery 06/28/2019.  Cardiology followed while he was in the hospital.  He was discharged 07/04/2019 to [facility] on Eliquis, Amiodarone 200 mg daily, and Lopressor 50 mg BID.  While at [facility] he had positive stools.  His  Hgb remained in the 8-8.5 range.  Eliquis was stopped.  He was discharged 6/29/2021and returns to the office today for follow up, his daughter accompanied him.  The patient tells me he feels like he is getting stronger.  He has not had tachycardia, in fact his HR has been running slow-45-50.  He was advised to have an endoscopy after discharge but he declined "I've been through enough".  He has also been followed by Dr Nevada Crane who is following his thyroid Rx.  He has an appointment with Dr Nevada Crane Monday for labs. He denies any further blood in his stools.   Past Medical History:  Diagnosis Date  . AKI (acute kidney injury) ([place]) 02/12/2019  . CAD (coronary artery disease)    a. cath 01/27/2009 : s/p promus DES to LAD and medical managment of 70-80% mRCA  . COVID-19   . Essential hypertension   . Hypothyroidism   . Mixed hyperlipidemia   . NSTEMI (non-ST elevated myocardial infarction) ([place]) 2011  . PAF (paroxysmal atrial fibrillation) ([place])     Past Surgical History:  Procedure Laterality Date  . BOWEL RESECTION N/A 06/28/2019   Procedure: SMALL BOWEL RESECTION;  Surgeon: Virl Cagey, MD;  Location: [facility];  Service: General;  Laterality: N/A;  . CHOLECYSTECTOMY    . CORONARY STENT PLACEMENT    . CYSTOSCOPY/RETROGRADE/URETEROSCOPY Bilateral 03/09/2019   Procedure: CYSTOSCOPY, Clot Evacuation, Fulguration;  Surgeon: Festus Aloe,  MD;  Location: [place];  Service: Urology;  Laterality: Bilateral;  . HERNIA REPAIR    . LYSIS OF ADHESION N/A 06/28/2019   Procedure: LYSIS OF ADHESION;  Surgeon: Virl Cagey, MD;  Location: [facility];  Service: General;  Laterality: N/A;  . VENTRAL HERNIA REPAIR N/A 06/28/2019   Procedure: HERNIA REPAIR VENTRAL ADULT WITH VICRYL MESH OVERLAY;  Surgeon: Virl Cagey, MD;  Location: [facility];  Service: General;  Laterality: N/A;    Current Medications: No outpatient medications have been marked as taking for the 08/19/19 encounter (Office Visit) with  Erlene Quan, PA-C.     Allergies:   Indomethacin and Iodine-131   Social History   Socioeconomic History  . Marital status: Married    Spouse name: Not on file  . Number of children: Not on file  . Years of education: Not on file  . Highest education level: Not on file  Occupational History  . Occupation: Charity fundraiser  Tobacco Use  . Smoking status: Former Smoker    Quit date: 01/20/1957    Years since quitting: 62.6  . Smokeless tobacco: Never Used  Vaping Use  . Vaping Use: Never used  Substance and Sexual Activity  . Alcohol use: No  . Drug use: Never  . Sexual activity: Not on file  Other Topics Concern  . Not on file  Social History Narrative   Quit smoking about 62 years ago.   Social Determinants of Health   Financial Resource Strain:   . Difficulty of Paying Living Expenses:   Food Insecurity:   . Worried About Charity fundraiser in the Last Year:   . Arboriculturist in the Last Year:   Transportation Needs:   . Film/video editor (Medical):   Marland Kitchen Lack of Transportation (Non-Medical):   Physical Activity:   . Days of Exercise per Week:   . Minutes of Exercise per Session:   Stress:   . Feeling of Stress :   Social Connections:   . Frequency of Communication with Friends and Family:   . Frequency of Social Gatherings with Friends and Family:   . Attends Religious Services:   . Active Member of Clubs or Organizations:   . Attends Archivist Meetings:   Marland Kitchen Marital Status:      Family History: The patient's family history includes Heart attack in his father; Lung cancer in his sister.  ROS:   Please see the history of present illness.     All other systems reviewed and are negative.  EKGs/Labs/Other Studies Reviewed:    The following studies were reviewed today: Echo 02/13/2019- IMPRESSIONS    1. Left ventricular ejection fraction, by visual estimation, is 60 to  65%. The left ventricle has normal function. There is moderately  increased  left ventricular hypertrophy.  2. Definity contrast agent was given IV to delineate the left ventricular  endocardial borders.  3. Left ventricular diastolic parameters are indeterminate.  4. The left ventricle has no regional wall motion abnormalities.  5. Global right ventricle has normal systolic function.The right  ventricular size is normal. Right vetricular wall thickness was not  assessed.  6. Left atrial size was severely dilated.  7. Right atrial size was normal.  8. The mitral valve is grossly normal. No evidence of mitral valve  regurgitation.  9. The tricuspid valve is grossly normal.  10. The tricuspid valve is grossly normal. Tricuspid valve regurgitation  is mild.  11. The aortic  valve is tricuspid. Aortic valve regurgitation is not  visualized. No evidence of aortic valve sclerosis or stenosis.  12. The pulmonic valve was grossly normal. Pulmonic valve regurgitation is  not visualized.  13. Mildly elevated pulmonary artery systolic pressure.  14. The interatrial septum was not well visualized.   EKG:  EKG is not ordered today.  The ekg ordered 06/30/2019 demonstrates NSR-66  Recent Labs: 03/07/2019: TSH 0.013 06/30/2019: B Natriuretic Peptide 132.0 07/04/2019: Magnesium 1.8 07/11/2019: ALT 11; BUN 20; Creatinine, Ser 1.69; Platelets 186; Sodium 140 07/18/2019: Hemoglobin 8.7; Potassium 3.3  Recent Lipid Panel    Component Value Date/Time   CHOL 138 05/28/2011 1030   TRIG 81 05/28/2011 1030   HDL 43 05/28/2011 1030   CHOLHDL 3.2 05/28/2011 1030   VLDL 16 05/28/2011 1030   LDLCALC 79 05/28/2011 1030    Physical Exam:    VS:  BP (!) 162/64   Pulse 56   Ht 6' (1.829 m)   Wt (!) 270 lb (122.5 kg)   BMI 36.62 kg/m     Wt Readings from Last 3 Encounters:  08/19/19 (!) 270 lb (122.5 kg)  07/19/19 271 lb 8 oz (123.2 kg)  07/12/19 271 lb 8 oz (123.2 kg)     GEN: Elderly, pale, overweight caucasian male, well developed in no acute  distress HEENT: Normal NECK: No JVD; No carotid bruits CARDIAC: RRR, no murmurs, rubs, gallops RESPIRATORY:  Clear to auscultation without rales, wheezing or rhonchi  ABDOMEN: Soft, non-tender, non-distended MUSCULOSKELETAL:  No edema; No deformity  SKIN: Warm and dry NEUROLOGIC:  Alert and oriented x 3 PSYCHIATRIC:  Normal affect   ASSESSMENT:    PAF- New onset Jan 2021 in setting of COVID, SBO, and hyperthyroidism. Pt discharged on Amiodarone he is now in NSR- decrease Metoprolol 25 mg BID  Anticoagulated- Eliquis stopped secondary to anemia and GI bleeding (pt declined GI work up)  CAD- H/O MI- LAD PCI in 2014.- No recent angina  Hyperthyroidism- PCP following  SBO- Surgery 06/28/2019  PLAN:    Hesitant to resume Eliquis or aspirin without further GI evaluation which the patient currently declines. One could argue that his AF in Jan 2021 was secondary to his acute illness with COVID and his hyperthyroidism. I'll forward to [place] and plan for cardiology f/u in 3 months.   Medication Adjustments/Labs and Tests Ordered: Current medicines are reviewed at length with the patient today.  Concerns regarding medicines are outlined above.  No orders of the defined types were placed in this encounter.  Meds ordered this encounter  Medications  . metoprolol tartrate (LOPRESSOR) 25 MG tablet    Sig: Take 1 tablet (25 mg total) by mouth 2 (two) times daily.    Dispense:  60 tablet    Refill:  3    There are no Patient Instructions on file for this visit.   Angelena Form, PA-C  08/19/2019 10:27 AM    [facility]

## 2019-08-21 DIAGNOSIS — E039 Hypothyroidism, unspecified: Secondary | ICD-10-CM | POA: Diagnosis not present

## 2019-08-21 DIAGNOSIS — Z48815 Encounter for surgical aftercare following surgery on the digestive system: Secondary | ICD-10-CM | POA: Diagnosis not present

## 2019-08-21 DIAGNOSIS — I10 Essential (primary) hypertension: Secondary | ICD-10-CM | POA: Diagnosis not present

## 2019-08-21 DIAGNOSIS — E876 Hypokalemia: Secondary | ICD-10-CM | POA: Diagnosis not present

## 2019-08-21 DIAGNOSIS — R600 Localized edema: Secondary | ICD-10-CM | POA: Diagnosis not present

## 2019-08-21 DIAGNOSIS — I252 Old myocardial infarction: Secondary | ICD-10-CM | POA: Diagnosis not present

## 2019-08-21 DIAGNOSIS — N138 Other obstructive and reflux uropathy: Secondary | ICD-10-CM | POA: Diagnosis not present

## 2019-08-21 DIAGNOSIS — I2510 Atherosclerotic heart disease of native coronary artery without angina pectoris: Secondary | ICD-10-CM | POA: Diagnosis not present

## 2019-08-21 DIAGNOSIS — N401 Enlarged prostate with lower urinary tract symptoms: Secondary | ICD-10-CM | POA: Diagnosis not present

## 2019-08-21 DIAGNOSIS — Z466 Encounter for fitting and adjustment of urinary device: Secondary | ICD-10-CM | POA: Diagnosis not present

## 2019-08-21 DIAGNOSIS — E43 Unspecified severe protein-calorie malnutrition: Secondary | ICD-10-CM | POA: Diagnosis not present

## 2019-08-21 DIAGNOSIS — I480 Paroxysmal atrial fibrillation: Secondary | ICD-10-CM | POA: Diagnosis not present

## 2019-08-21 DIAGNOSIS — D62 Acute posthemorrhagic anemia: Secondary | ICD-10-CM | POA: Diagnosis not present

## 2019-08-22 DIAGNOSIS — E0590 Thyrotoxicosis, unspecified without thyrotoxic crisis or storm: Secondary | ICD-10-CM | POA: Diagnosis not present

## 2019-08-22 DIAGNOSIS — I10 Essential (primary) hypertension: Secondary | ICD-10-CM | POA: Diagnosis not present

## 2019-08-22 DIAGNOSIS — R7301 Impaired fasting glucose: Secondary | ICD-10-CM | POA: Diagnosis not present

## 2019-08-22 DIAGNOSIS — E782 Mixed hyperlipidemia: Secondary | ICD-10-CM | POA: Diagnosis not present

## 2019-08-22 DIAGNOSIS — Z6841 Body Mass Index (BMI) 40.0 and over, adult: Secondary | ICD-10-CM | POA: Diagnosis not present

## 2019-08-22 DIAGNOSIS — E46 Unspecified protein-calorie malnutrition: Secondary | ICD-10-CM | POA: Diagnosis not present

## 2019-08-22 DIAGNOSIS — N401 Enlarged prostate with lower urinary tract symptoms: Secondary | ICD-10-CM | POA: Diagnosis not present

## 2019-08-22 DIAGNOSIS — E785 Hyperlipidemia, unspecified: Secondary | ICD-10-CM | POA: Diagnosis not present

## 2019-08-22 DIAGNOSIS — Z0189 Encounter for other specified special examinations: Secondary | ICD-10-CM | POA: Diagnosis not present

## 2019-08-22 DIAGNOSIS — Z48816 Encounter for surgical aftercare following surgery on the genitourinary system: Secondary | ICD-10-CM | POA: Diagnosis not present

## 2019-08-22 DIAGNOSIS — R972 Elevated prostate specific antigen [PSA]: Secondary | ICD-10-CM | POA: Diagnosis not present

## 2019-08-23 ENCOUNTER — Other Ambulatory Visit (HOSPITAL_COMMUNITY)
Admission: RE | Admit: 2019-08-23 | Discharge: 2019-08-23 | Disposition: A | Discharge status: Home / Self Care | Payer: PPO | Source: Ambulatory Visit | Attending: Cardiovascular Disease | Admitting: Cardiovascular Disease

## 2019-08-23 DIAGNOSIS — H04203 Unspecified epiphora, bilateral lacrimal glands: Secondary | ICD-10-CM | POA: Diagnosis not present

## 2019-08-23 NOTE — Progress Notes (Signed)
Patient's procedure was cancelled. No need for covid screening. Nothing further needed.

## 2019-08-24 ENCOUNTER — Ambulatory Visit: Payer: PPO | Admitting: Cardiovascular Disease

## 2019-08-24 ENCOUNTER — Encounter (HOSPITAL_COMMUNITY): Payer: Self-pay

## 2019-08-24 ENCOUNTER — Ambulatory Visit (HOSPITAL_COMMUNITY): Admit: 2019-08-24 | Payer: PPO | Admitting: Internal Medicine

## 2019-08-24 SURGERY — EGD (ESOPHAGOGASTRODUODENOSCOPY)
Anesthesia: Moderate Sedation

## 2019-09-07 ENCOUNTER — Telehealth: Payer: Self-pay | Admitting: Cardiovascular Disease

## 2019-09-07 NOTE — Telephone Encounter (Signed)
Spoke with pt and HR is still running in the upper 40's and low 50's Per pt is currenlty taking Metoprolol 25 mg every day Per pt feels fine Will forward to Dr Oval Linsey for review .Adonis Housekeeper

## 2019-09-07 NOTE — Telephone Encounter (Signed)
  Pt c/o medication issue:  1. Name of Medication: metoprolol tartrate (LOPRESSOR) 25 MG tablet  2. How are you currently taking this medication (dosage and times per day)? 25 mg 2x daily   3. Are you having a reaction (difficulty breathing--STAT)? Low HR   4. What is your medication issue? Daughter is concerned that the patient may still be on too much medication. His HR is low at times.  The daughter is not with the patient at this time

## 2019-09-09 MED ORDER — METOPROLOL TARTRATE 25 MG PO TABS: 12.5000 mg | ORAL_TABLET | Freq: Two times a day (BID) | ORAL | 3 refills | Status: DC

## 2019-09-09 NOTE — Telephone Encounter (Signed)
Follow up    Pt's daughter returning call from [facility]

## 2019-09-09 NOTE — Telephone Encounter (Signed)
Left message to call back  

## 2019-09-09 NOTE — Telephone Encounter (Signed)
Spoke with pt daughter, aware of dr Woolstock's recommendations.

## 2019-09-09 NOTE — Telephone Encounter (Signed)
Reduce to 12.5mg  bid.

## 2019-09-22 DIAGNOSIS — R972 Elevated prostate specific antigen [PSA]: Secondary | ICD-10-CM | POA: Diagnosis not present

## 2019-09-22 DIAGNOSIS — E782 Mixed hyperlipidemia: Secondary | ICD-10-CM | POA: Diagnosis not present

## 2019-09-22 DIAGNOSIS — I2510 Atherosclerotic heart disease of native coronary artery without angina pectoris: Secondary | ICD-10-CM | POA: Diagnosis not present

## 2019-09-22 DIAGNOSIS — R338 Other retention of urine: Secondary | ICD-10-CM | POA: Diagnosis not present

## 2019-09-22 DIAGNOSIS — I119 Hypertensive heart disease without heart failure: Secondary | ICD-10-CM | POA: Diagnosis not present

## 2019-09-22 DIAGNOSIS — D649 Anemia, unspecified: Secondary | ICD-10-CM | POA: Diagnosis not present

## 2019-09-22 DIAGNOSIS — J90 Pleural effusion, not elsewhere classified: Secondary | ICD-10-CM | POA: Diagnosis not present

## 2019-09-22 DIAGNOSIS — I10 Essential (primary) hypertension: Secondary | ICD-10-CM | POA: Diagnosis not present

## 2019-09-22 DIAGNOSIS — R7301 Impaired fasting glucose: Secondary | ICD-10-CM | POA: Diagnosis not present

## 2019-09-22 DIAGNOSIS — N401 Enlarged prostate with lower urinary tract symptoms: Secondary | ICD-10-CM | POA: Diagnosis not present

## 2019-09-22 DIAGNOSIS — E039 Hypothyroidism, unspecified: Secondary | ICD-10-CM | POA: Diagnosis not present

## 2019-09-22 DIAGNOSIS — E0590 Thyrotoxicosis, unspecified without thyrotoxic crisis or storm: Secondary | ICD-10-CM | POA: Diagnosis not present

## 2019-09-25 ENCOUNTER — Telehealth: Payer: Self-pay | Admitting: Gastroenterology

## 2019-09-25 NOTE — Telephone Encounter (Signed)
Reviewed labs from 07/2019: Hgb 9.3. iron 43, tibc 249, ferritin 80.  Patient cancelled his EGD. Prior  H/o black stools.   Would offer updated CBC

## 2019-09-27 NOTE — Telephone Encounter (Signed)
Tried calling pt. No VM, will call back.

## 2019-09-27 NOTE — Telephone Encounter (Signed)
Spoke with pt. Pt had labs completed with Dr. Nevada Crane last week. Please request labs .

## 2019-09-28 DIAGNOSIS — R338 Other retention of urine: Secondary | ICD-10-CM | POA: Diagnosis not present

## 2019-09-28 NOTE — Telephone Encounter (Signed)
Requested labs

## 2019-10-18 DIAGNOSIS — E042 Nontoxic multinodular goiter: Secondary | ICD-10-CM | POA: Diagnosis not present

## 2019-10-18 DIAGNOSIS — E876 Hypokalemia: Secondary | ICD-10-CM | POA: Diagnosis not present

## 2019-10-18 DIAGNOSIS — R338 Other retention of urine: Secondary | ICD-10-CM | POA: Diagnosis not present

## 2019-10-18 DIAGNOSIS — R7301 Impaired fasting glucose: Secondary | ICD-10-CM | POA: Diagnosis not present

## 2019-10-18 DIAGNOSIS — N401 Enlarged prostate with lower urinary tract symptoms: Secondary | ICD-10-CM | POA: Diagnosis not present

## 2019-10-18 DIAGNOSIS — E782 Mixed hyperlipidemia: Secondary | ICD-10-CM | POA: Diagnosis not present

## 2019-10-18 DIAGNOSIS — Z0189 Encounter for other specified special examinations: Secondary | ICD-10-CM | POA: Diagnosis not present

## 2019-10-18 DIAGNOSIS — Z6841 Body Mass Index (BMI) 40.0 and over, adult: Secondary | ICD-10-CM | POA: Diagnosis not present

## 2019-10-18 DIAGNOSIS — D631 Anemia in chronic kidney disease: Secondary | ICD-10-CM | POA: Diagnosis not present

## 2019-10-18 DIAGNOSIS — I10 Essential (primary) hypertension: Secondary | ICD-10-CM | POA: Diagnosis not present

## 2019-10-18 DIAGNOSIS — I2510 Atherosclerotic heart disease of native coronary artery without angina pectoris: Secondary | ICD-10-CM | POA: Diagnosis not present

## 2019-10-18 DIAGNOSIS — E46 Unspecified protein-calorie malnutrition: Secondary | ICD-10-CM | POA: Diagnosis not present

## 2019-10-18 DIAGNOSIS — R972 Elevated prostate specific antigen [PSA]: Secondary | ICD-10-CM | POA: Diagnosis not present

## 2019-10-18 DIAGNOSIS — I119 Hypertensive heart disease without heart failure: Secondary | ICD-10-CM | POA: Diagnosis not present

## 2019-10-18 DIAGNOSIS — E0590 Thyrotoxicosis, unspecified without thyrotoxic crisis or storm: Secondary | ICD-10-CM | POA: Diagnosis not present

## 2019-10-18 DIAGNOSIS — Z48816 Encounter for surgical aftercare following surgery on the genitourinary system: Secondary | ICD-10-CM | POA: Diagnosis not present

## 2019-10-18 DIAGNOSIS — E785 Hyperlipidemia, unspecified: Secondary | ICD-10-CM | POA: Diagnosis not present

## 2019-10-21 DIAGNOSIS — M7741 Metatarsalgia, right foot: Secondary | ICD-10-CM | POA: Diagnosis not present

## 2019-10-21 DIAGNOSIS — L851 Acquired keratosis [keratoderma] palmaris et plantaris: Secondary | ICD-10-CM | POA: Diagnosis not present

## 2019-10-21 DIAGNOSIS — M79671 Pain in right foot: Secondary | ICD-10-CM | POA: Diagnosis not present

## 2019-10-21 DIAGNOSIS — G629 Polyneuropathy, unspecified: Secondary | ICD-10-CM | POA: Diagnosis not present

## 2019-11-25 DIAGNOSIS — R339 Retention of urine, unspecified: Secondary | ICD-10-CM | POA: Diagnosis not present

## 2019-11-28 DIAGNOSIS — E0590 Thyrotoxicosis, unspecified without thyrotoxic crisis or storm: Secondary | ICD-10-CM | POA: Diagnosis not present

## 2019-11-28 DIAGNOSIS — D631 Anemia in chronic kidney disease: Secondary | ICD-10-CM | POA: Diagnosis not present

## 2019-11-28 DIAGNOSIS — N401 Enlarged prostate with lower urinary tract symptoms: Secondary | ICD-10-CM | POA: Diagnosis not present

## 2019-11-28 DIAGNOSIS — Z48816 Encounter for surgical aftercare following surgery on the genitourinary system: Secondary | ICD-10-CM | POA: Diagnosis not present

## 2019-11-28 DIAGNOSIS — I10 Essential (primary) hypertension: Secondary | ICD-10-CM | POA: Diagnosis not present

## 2019-11-28 DIAGNOSIS — Z6841 Body Mass Index (BMI) 40.0 and over, adult: Secondary | ICD-10-CM | POA: Diagnosis not present

## 2019-11-28 DIAGNOSIS — R7301 Impaired fasting glucose: Secondary | ICD-10-CM | POA: Diagnosis not present

## 2019-11-28 DIAGNOSIS — E785 Hyperlipidemia, unspecified: Secondary | ICD-10-CM | POA: Diagnosis not present

## 2019-11-28 DIAGNOSIS — E782 Mixed hyperlipidemia: Secondary | ICD-10-CM | POA: Diagnosis not present

## 2019-11-28 DIAGNOSIS — R972 Elevated prostate specific antigen [PSA]: Secondary | ICD-10-CM | POA: Diagnosis not present

## 2019-11-28 DIAGNOSIS — Z0189 Encounter for other specified special examinations: Secondary | ICD-10-CM | POA: Diagnosis not present

## 2019-11-29 DIAGNOSIS — I2510 Atherosclerotic heart disease of native coronary artery without angina pectoris: Secondary | ICD-10-CM | POA: Diagnosis not present

## 2019-11-29 DIAGNOSIS — E042 Nontoxic multinodular goiter: Secondary | ICD-10-CM | POA: Diagnosis not present

## 2019-11-29 DIAGNOSIS — E876 Hypokalemia: Secondary | ICD-10-CM | POA: Diagnosis not present

## 2019-11-29 DIAGNOSIS — R338 Other retention of urine: Secondary | ICD-10-CM | POA: Diagnosis not present

## 2019-11-29 DIAGNOSIS — D631 Anemia in chronic kidney disease: Secondary | ICD-10-CM | POA: Diagnosis not present

## 2019-11-29 DIAGNOSIS — N401 Enlarged prostate with lower urinary tract symptoms: Secondary | ICD-10-CM | POA: Diagnosis not present

## 2019-11-29 DIAGNOSIS — E7849 Other hyperlipidemia: Secondary | ICD-10-CM | POA: Diagnosis not present

## 2019-11-29 DIAGNOSIS — I10 Essential (primary) hypertension: Secondary | ICD-10-CM | POA: Diagnosis not present

## 2019-12-08 DIAGNOSIS — R339 Retention of urine, unspecified: Secondary | ICD-10-CM | POA: Diagnosis not present

## 2019-12-09 ENCOUNTER — Telehealth: Payer: Self-pay | Admitting: Gastroenterology

## 2019-12-09 NOTE — Telephone Encounter (Signed)
Reviewed labs from PCP dated 08/22/19:  H/H 10.3/32.7, mcv 90, bun 14  Can we see if patient has had any additional labs with PCP?

## 2019-12-12 NOTE — Telephone Encounter (Signed)
requested

## 2019-12-23 ENCOUNTER — Other Ambulatory Visit: Payer: Self-pay | Admitting: Cardiology

## 2019-12-26 NOTE — Telephone Encounter (Signed)
Called pt. No VM, will call pt back.

## 2019-12-26 NOTE — Telephone Encounter (Signed)
Last labs were done in 08/2019.   Would recommend following up with PCP for ongoing anemia vs letting us check his CBC and iron/tibc/ferritin. Previously declined EGD.

## 2019-12-27 DIAGNOSIS — R7301 Impaired fasting glucose: Secondary | ICD-10-CM | POA: Diagnosis not present

## 2019-12-27 DIAGNOSIS — Z6841 Body Mass Index (BMI) 40.0 and over, adult: Secondary | ICD-10-CM | POA: Diagnosis not present

## 2019-12-27 DIAGNOSIS — D631 Anemia in chronic kidney disease: Secondary | ICD-10-CM | POA: Diagnosis not present

## 2019-12-27 DIAGNOSIS — Z0189 Encounter for other specified special examinations: Secondary | ICD-10-CM | POA: Diagnosis not present

## 2019-12-27 DIAGNOSIS — E782 Mixed hyperlipidemia: Secondary | ICD-10-CM | POA: Diagnosis not present

## 2019-12-27 DIAGNOSIS — E0590 Thyrotoxicosis, unspecified without thyrotoxic crisis or storm: Secondary | ICD-10-CM | POA: Diagnosis not present

## 2019-12-27 DIAGNOSIS — R972 Elevated prostate specific antigen [PSA]: Secondary | ICD-10-CM | POA: Diagnosis not present

## 2019-12-27 DIAGNOSIS — Z48816 Encounter for surgical aftercare following surgery on the genitourinary system: Secondary | ICD-10-CM | POA: Diagnosis not present

## 2019-12-27 DIAGNOSIS — Z23 Encounter for immunization: Secondary | ICD-10-CM | POA: Diagnosis not present

## 2019-12-27 DIAGNOSIS — I10 Essential (primary) hypertension: Secondary | ICD-10-CM | POA: Diagnosis not present

## 2019-12-27 DIAGNOSIS — N401 Enlarged prostate with lower urinary tract symptoms: Secondary | ICD-10-CM | POA: Diagnosis not present

## 2019-12-27 DIAGNOSIS — E785 Hyperlipidemia, unspecified: Secondary | ICD-10-CM | POA: Diagnosis not present

## 2019-12-27 NOTE — Telephone Encounter (Signed)
Spoke with pt. Pt went to Dr. Juel Burrow office today for a f/u. Pt had labs completed today. Manuela Schwartz- please request recent labs from Dr. Juel Burrow office.

## 2019-12-28 NOTE — Telephone Encounter (Signed)
Requested recent labs from PCP °

## 2019-12-30 DIAGNOSIS — G629 Polyneuropathy, unspecified: Secondary | ICD-10-CM | POA: Diagnosis not present

## 2019-12-30 DIAGNOSIS — L851 Acquired keratosis [keratoderma] palmaris et plantaris: Secondary | ICD-10-CM | POA: Diagnosis not present

## 2019-12-30 DIAGNOSIS — M7741 Metatarsalgia, right foot: Secondary | ICD-10-CM | POA: Diagnosis not present

## 2020-01-03 DIAGNOSIS — I2510 Atherosclerotic heart disease of native coronary artery without angina pectoris: Secondary | ICD-10-CM | POA: Diagnosis not present

## 2020-01-03 DIAGNOSIS — I119 Hypertensive heart disease without heart failure: Secondary | ICD-10-CM | POA: Diagnosis not present

## 2020-01-03 DIAGNOSIS — J90 Pleural effusion, not elsewhere classified: Secondary | ICD-10-CM | POA: Diagnosis not present

## 2020-01-03 DIAGNOSIS — R7301 Impaired fasting glucose: Secondary | ICD-10-CM | POA: Diagnosis not present

## 2020-01-03 DIAGNOSIS — E0590 Thyrotoxicosis, unspecified without thyrotoxic crisis or storm: Secondary | ICD-10-CM | POA: Diagnosis not present

## 2020-01-03 DIAGNOSIS — N401 Enlarged prostate with lower urinary tract symptoms: Secondary | ICD-10-CM | POA: Diagnosis not present

## 2020-01-03 DIAGNOSIS — E782 Mixed hyperlipidemia: Secondary | ICD-10-CM | POA: Diagnosis not present

## 2020-01-03 DIAGNOSIS — R972 Elevated prostate specific antigen [PSA]: Secondary | ICD-10-CM | POA: Diagnosis not present

## 2020-01-03 DIAGNOSIS — I10 Essential (primary) hypertension: Secondary | ICD-10-CM | POA: Diagnosis not present

## 2020-01-03 DIAGNOSIS — D649 Anemia, unspecified: Secondary | ICD-10-CM | POA: Diagnosis not present

## 2020-01-03 DIAGNOSIS — R338 Other retention of urine: Secondary | ICD-10-CM | POA: Diagnosis not present

## 2020-01-03 DIAGNOSIS — E039 Hypothyroidism, unspecified: Secondary | ICD-10-CM | POA: Diagnosis not present

## 2020-01-16 ENCOUNTER — Telehealth: Payer: Self-pay | Admitting: Cardiovascular Disease

## 2020-01-16 NOTE — Telephone Encounter (Signed)
New message     Patient requesting switch from Dr Duke Salvia to a [place] provider due to not being able to travel long distance

## 2020-01-17 NOTE — Telephone Encounter (Signed)
Darl Pikes, I have not seen labs request.

## 2020-01-18 NOTE — Telephone Encounter (Signed)
I sent an ASAP request for them to fax labs to [place].

## 2020-01-20 DIAGNOSIS — E782 Mixed hyperlipidemia: Secondary | ICD-10-CM | POA: Diagnosis not present

## 2020-01-20 DIAGNOSIS — R7301 Impaired fasting glucose: Secondary | ICD-10-CM | POA: Diagnosis not present

## 2020-01-20 DIAGNOSIS — D649 Anemia, unspecified: Secondary | ICD-10-CM | POA: Diagnosis not present

## 2020-01-20 DIAGNOSIS — N400 Enlarged prostate without lower urinary tract symptoms: Secondary | ICD-10-CM | POA: Diagnosis not present

## 2020-01-20 DIAGNOSIS — E0590 Thyrotoxicosis, unspecified without thyrotoxic crisis or storm: Secondary | ICD-10-CM | POA: Diagnosis not present

## 2020-01-20 DIAGNOSIS — R972 Elevated prostate specific antigen [PSA]: Secondary | ICD-10-CM | POA: Diagnosis not present

## 2020-01-20 DIAGNOSIS — I10 Essential (primary) hypertension: Secondary | ICD-10-CM | POA: Diagnosis not present

## 2020-01-20 DIAGNOSIS — M25561 Pain in right knee: Secondary | ICD-10-CM | POA: Diagnosis not present

## 2020-01-24 DIAGNOSIS — E785 Hyperlipidemia, unspecified: Secondary | ICD-10-CM | POA: Diagnosis not present

## 2020-01-24 DIAGNOSIS — I509 Heart failure, unspecified: Secondary | ICD-10-CM | POA: Diagnosis not present

## 2020-01-24 DIAGNOSIS — K436 Other and unspecified ventral hernia with obstruction, without gangrene: Secondary | ICD-10-CM | POA: Diagnosis not present

## 2020-01-24 DIAGNOSIS — E039 Hypothyroidism, unspecified: Secondary | ICD-10-CM | POA: Diagnosis not present

## 2020-01-24 DIAGNOSIS — D649 Anemia, unspecified: Secondary | ICD-10-CM | POA: Diagnosis not present

## 2020-01-24 DIAGNOSIS — R338 Other retention of urine: Secondary | ICD-10-CM | POA: Diagnosis not present

## 2020-01-24 DIAGNOSIS — I87313 Chronic venous hypertension (idiopathic) with ulcer of bilateral lower extremity: Secondary | ICD-10-CM | POA: Diagnosis not present

## 2020-01-24 DIAGNOSIS — N401 Enlarged prostate with lower urinary tract symptoms: Secondary | ICD-10-CM | POA: Diagnosis not present

## 2020-01-24 DIAGNOSIS — E782 Mixed hyperlipidemia: Secondary | ICD-10-CM | POA: Diagnosis not present

## 2020-01-24 DIAGNOSIS — Z48816 Encounter for surgical aftercare following surgery on the genitourinary system: Secondary | ICD-10-CM | POA: Diagnosis not present

## 2020-01-24 DIAGNOSIS — D631 Anemia in chronic kidney disease: Secondary | ICD-10-CM | POA: Diagnosis not present

## 2020-01-24 DIAGNOSIS — Z6841 Body Mass Index (BMI) 40.0 and over, adult: Secondary | ICD-10-CM | POA: Diagnosis not present

## 2020-01-24 DIAGNOSIS — I2510 Atherosclerotic heart disease of native coronary artery without angina pectoris: Secondary | ICD-10-CM | POA: Diagnosis not present

## 2020-01-24 DIAGNOSIS — I10 Essential (primary) hypertension: Secondary | ICD-10-CM | POA: Diagnosis not present

## 2020-01-24 DIAGNOSIS — R972 Elevated prostate specific antigen [PSA]: Secondary | ICD-10-CM | POA: Diagnosis not present

## 2020-01-24 DIAGNOSIS — E0590 Thyrotoxicosis, unspecified without thyrotoxic crisis or storm: Secondary | ICD-10-CM | POA: Diagnosis not present

## 2020-01-24 DIAGNOSIS — Z0189 Encounter for other specified special examinations: Secondary | ICD-10-CM | POA: Diagnosis not present

## 2020-01-24 DIAGNOSIS — R7301 Impaired fasting glucose: Secondary | ICD-10-CM | POA: Diagnosis not present

## 2020-01-24 DIAGNOSIS — I119 Hypertensive heart disease without heart failure: Secondary | ICD-10-CM | POA: Diagnosis not present

## 2020-01-25 NOTE — Telephone Encounter (Signed)
Patient has appointment in [facility] 02/01/2020

## 2020-01-25 NOTE — Telephone Encounter (Signed)
That's no problem

## 2020-01-26 NOTE — Telephone Encounter (Signed)
Labs dated 12/27/2019: Ordered by PCP White blood cell count 6800, hemoglobin 10.2, hematocrit 31.7, MCV 90, platelets 190,000, creatinine 1.64, BUN 24, total bilirubin 0.3, alk phos 99, AST 12, ALT 13, A1c 5.3, TSH 0.22, free T4 2.16, PSA 6.5.  Patient has stable but persistent anemia. Would offer him follow up if he is interested in consider work up. We saw him last year for anemia, heme + stool, melena but due to multiple recent illnesses he declined work up.

## 2020-01-30 NOTE — Telephone Encounter (Signed)
Spoke with pt. Pt is aware that his labs were reviewed and due to his hx of persistent anemia/heme + stool, we would offer him a follow up. At this time pt has declined making a follow up. Per pt, he will call back when he is ready to schedule.

## 2020-01-30 NOTE — Progress Notes (Addendum)
Virtual Visit via Telephone Note   This visit type was conducted due to national recommendations for restrictions regarding the COVID-19 Pandemic (e.g. social distancing) in an effort to limit this patient's exposure and mitigate transmission in our community.  Due to his co-morbid illnesses, this patient is at least at moderate risk for complications without adequate follow up.  This format is felt to be most appropriate for this patient at this time.  The patient did not have access to video technology/had technical difficulties with video requiring transitioning to audio format only (telephone).  All issues noted in this document were discussed and addressed.  No physical exam could be performed with this format.  Please refer to the patient's chart for his  consent to telehealth for [facility].    Date:  02/01/2020   ID:  Derek Foster, DOB [birth year], MRN [id number] The patient was identified using 2 identifiers.  Patient Location: Home Provider Location: Office/Clinic  PCP:  Derek Squibb, MD  Cardiologist:  Derek Latch, MD   Electrophysiologist:  None   Evaluation Performed:  Follow-Up Visit  Chief Complaint:  Follow up  History of Present Illness:    Derek Foster is a 85 y.o. male with a hx of CAD, hx MI PCI to LAD 2011, HTN, HLD, PAF in the setting of COVID-19 and Small bowel obstruction 01/2019, hyperthyroid     Patient had hernia repair 06/2019 and Eliquis was on hold.  He then developed guaiac positive stools and hemoglobin ranging in the 8-8.5 range.  Eliquis was never restarted.  He had maintained normal sinus rhythm on amiodarone.  Patient not feeling well today. Says he has a bronchial infection. Very weak and fatigued. Too tired to get covid19 testing. His son was sick last week. Son tested negative and patient is vaccinated. Had swelling in his legs and went to his MD last week. Lasix increased to 40 mg daily.Was told someone was going to wrap them. Has home  health coming and they haven't wrapped them yet. Denies chest pain, dyspnea, dizziness or palpitations. Watching salt closely. Had blood work last week.  The patient does not have symptoms concerning for COVID-19 infection (fever, chills, cough, or new shortness of breath).    Past Medical History:  Diagnosis Date  . AKI (acute kidney injury) ([place]) 02/12/2019  . CAD (coronary artery disease)    a. cath 01/27/2009 : s/p promus DES to LAD and medical managment of 70-80% mRCA  . COVID-19   . Essential hypertension   . Hypothyroidism   . Mixed hyperlipidemia   . NSTEMI (non-ST elevated myocardial infarction) ([place]) 2011  . PAF (paroxysmal atrial fibrillation) ([place])    Past Surgical History:  Procedure Laterality Date  . BOWEL RESECTION N/A 06/28/2019   Procedure: SMALL BOWEL RESECTION;  Surgeon: Virl Cagey, MD;  Location: [facility];  Service: General;  Laterality: N/A;  . CHOLECYSTECTOMY    . CORONARY STENT PLACEMENT    . CYSTOSCOPY/RETROGRADE/URETEROSCOPY Bilateral 03/09/2019   Procedure: CYSTOSCOPY, Clot Evacuation, Fulguration;  Surgeon: Festus Aloe, MD;  Location: [place];  Service: Urology;  Laterality: Bilateral;  . HERNIA REPAIR    . LYSIS OF ADHESION N/A 06/28/2019   Procedure: LYSIS OF ADHESION;  Surgeon: Virl Cagey, MD;  Location: [facility];  Service: General;  Laterality: N/A;  . VENTRAL HERNIA REPAIR N/A 06/28/2019   Procedure: HERNIA REPAIR VENTRAL ADULT WITH VICRYL MESH OVERLAY;  Surgeon: Virl Cagey, MD;  Location: [facility];  Service:  General;  Laterality: N/A;     Current Meds  Medication Sig  . amiodarone (PACERONE) 200 MG tablet Take 1 tablet (200 mg total) by mouth daily.  Marland Kitchen amLODipine (NORVASC) 5 MG tablet Take 5 mg by mouth daily.  . Coenzyme Q10 200 MG capsule Take 200 mg by mouth daily.  . finasteride (PROSCAR) 5 MG tablet Take 1 tablet (5 mg total) by mouth daily.  . furosemide (LASIX) 20 MG tablet Take 2 tablets (40 mg total) by mouth daily.  .  methimazole (TAPAZOLE) 10 MG tablet Take 2 tablets (20 mg total) by mouth 2 (two) times daily.  . metoprolol tartrate (LOPRESSOR) 25 MG tablet Take 12.5 mg by mouth 2 (two) times daily.  . Multiple Vitamin (MULTIVITAMIN) tablet Take 1 tablet by mouth daily.  . nitroGLYCERIN (NITROSTAT) 0.4 MG SL tablet Place 1 tablet (0.4 mg total) under the tongue every 5 (five) minutes as needed.  . NON FORMULARY Diet - NAS  . ofloxacin (OCUFLOX) 0.3 % ophthalmic solution Place 1 drop into both eyes 4 (four) times daily.  . potassium chloride (KLOR-CON) 10 MEQ tablet Take 1 tablet (10 mEq total) by mouth every evening.  . pravastatin (PRAVACHOL) 10 MG tablet Take 1 tablet (10 mg total) by mouth daily at 6 PM.  . tamsulosin (FLOMAX) 0.4 MG CAPS capsule Take 1 capsule (0.4 mg total) by mouth daily after breakfast.     Allergies:   Indomethacin and Iodine-131   Social History   Tobacco Use  . Smoking status: Former Smoker    Quit date: 01/20/1957    Years since quitting: 63.0  . Smokeless tobacco: Never Used  Vaping Use  . Vaping Use: Never used  Substance Use Topics  . Alcohol use: No  . Drug use: Never     Family Hx: The patient's family history includes Heart attack in his father; Lung cancer in his sister.  ROS:   Please see the history of present illness.      All other systems reviewed and are negative.   Prior CV studies:   The following studies were reviewed today:  Echo 02/13/2019- IMPRESSIONS     1. Left ventricular ejection fraction, by visual estimation, is 60 to  65%. The left ventricle has normal function. There is moderately increased  left ventricular hypertrophy.   2. Definity contrast agent was given IV to delineate the left ventricular  endocardial borders.   3. Left ventricular diastolic parameters are indeterminate.   4. The left ventricle has no regional wall motion abnormalities.   5. Global right ventricle has normal systolic function.The right  ventricular size  is normal. Right vetricular wall thickness was not  assessed.   6. Left atrial size was severely dilated.   7. Right atrial size was normal.   8. The mitral valve is grossly normal. No evidence of mitral valve  regurgitation.   9. The tricuspid valve is grossly normal.  10. The tricuspid valve is grossly normal. Tricuspid valve regurgitation  is mild.  11. The aortic valve is tricuspid. Aortic valve regurgitation is not  visualized. No evidence of aortic valve sclerosis or stenosis.  12. The pulmonic valve was grossly normal. Pulmonic valve regurgitation is  not visualized.  13. Mildly elevated pulmonary artery systolic pressure.  14. The interatrial septum was not well visualized.      Labs/Other Tests and Data Reviewed:    EKG:  No ECG reviewed.  Recent Labs: 03/07/2019: TSH 0.013 06/30/2019: B Natriuretic Peptide 132.0  07/04/2019: Magnesium 1.8 07/11/2019: ALT 11; BUN 20; Creatinine, Ser 1.69; Platelets 186; Sodium 140 07/18/2019: Hemoglobin 8.7; Potassium 3.3   Recent Lipid Panel Lab Results  Component Value Date/Time   CHOL 138 05/28/2011 10:30 AM   TRIG 81 05/28/2011 10:30 AM   HDL 43 05/28/2011 10:30 AM   CHOLHDL 3.2 05/28/2011 10:30 AM   LDLCALC 79 05/28/2011 10:30 AM    Wt Readings from Last 3 Encounters:  02/01/20 280 lb (127 kg)  08/19/19 (!) 270 lb (122.5 kg)  07/19/19 271 lb 8 oz (123.2 kg)     Risk Assessment/Calculations:     CHA2DS2-VASc Score = 5  This indicates a 7.2% annual risk of stroke. The patient's score is based upon: CHF History: Yes HTN History: Yes Diabetes History: No Stroke History: No Vascular Disease History: Yes Age Score: 2 Gender Score: 0     Objective:    Vital Signs:  BP 140/64   Pulse (!) 54   Ht 6' (1.829 m)   Wt 280 lb (127 kg)   BMI 37.97 kg/m    VITAL SIGNS:  reviewed  ASSESSMENT & PLAN:     CAD status post DES to the LAD in 2011, no active angina at this time. LVEF 60 to 65% by echocardiogram in January of  this year.  On Lopressor and Pravachol as an outpatient, would start ASA 81 mg daily if ok with GI since off eliquis. No angina.     Paroxysmal atrial fibrillation in the setting of Covid19 infection and hyperthyroidism 01/2019. CHA2DS2-VASc score of 4  Eliquis has been on hold with surgery in June 2021 but never resumed b/c of positve stools and hgb 8-8.5 resume prior to d/c Continue lopressor. On amiodarone. Had labs done by Dr. Laqueta Linden request copy.   Lower extremity edema-lasix increased by PCP with some improvement. Patient with URI now and switched visit to virtual so hard to assess. If no improvement needs to be seen next week. Labs drawn by PCP    HTN BP controlled  HLD labs checked by PCP on pravachol         COVID-19 Education: The signs and symptoms of COVID-19 were discussed with the patient and how to seek care for testing (follow up with PCP or arrange E-visit).   The importance of social distancing was discussed today.  Time:   Today, I have spent 12:40 minutes with the patient with telehealth technology discussing the above problems.     Medication Adjustments/Labs and Tests Ordered: Current medicines are reviewed at length with the patient today.  Concerns regarding medicines are outlined above.   Tests Ordered: No orders of the defined types were placed in this encounter.   Medication Changes: No orders of the defined types were placed in this encounter.   Follow Up:  In Person in 2 month(s)Dr. Branch/Dr. Domenic Polite to be established  Signed, Ermalinda Barrios, PA-C  02/01/2020 3:00 PM    [place]

## 2020-01-30 NOTE — Telephone Encounter (Signed)
Noted  

## 2020-02-01 ENCOUNTER — Encounter: Payer: Self-pay | Admitting: *Deleted

## 2020-02-01 ENCOUNTER — Encounter: Payer: Self-pay | Admitting: Physician Assistant

## 2020-02-01 ENCOUNTER — Telehealth (INDEPENDENT_AMBULATORY_CARE_PROVIDER_SITE_OTHER): Payer: PPO | Admitting: Physician Assistant

## 2020-02-01 ENCOUNTER — Other Ambulatory Visit: Payer: Self-pay

## 2020-02-01 VITALS — BP 140/64 | HR 54 | Ht 72.0 in | Wt 280.0 lb

## 2020-02-01 DIAGNOSIS — I2510 Atherosclerotic heart disease of native coronary artery without angina pectoris: Secondary | ICD-10-CM | POA: Diagnosis not present

## 2020-02-01 DIAGNOSIS — I480 Paroxysmal atrial fibrillation: Secondary | ICD-10-CM

## 2020-02-01 DIAGNOSIS — I10 Essential (primary) hypertension: Secondary | ICD-10-CM | POA: Diagnosis not present

## 2020-02-01 DIAGNOSIS — R600 Localized edema: Secondary | ICD-10-CM | POA: Diagnosis not present

## 2020-02-01 DIAGNOSIS — E785 Hyperlipidemia, unspecified: Secondary | ICD-10-CM | POA: Diagnosis not present

## 2020-02-01 NOTE — Patient Instructions (Addendum)
Medication Instructions:  Your physician recommends that you continue on your current medications as directed. Please refer to the Current Medication list given to you today. *If you need a refill on your cardiac medications before your next appointment, please call your pharmacy*   Lab Work: None If you have labs (blood work) drawn today and your tests are completely normal, you will receive your results only by: Marland Kitchen MyChart Message (if you have MyChart) OR . A paper copy in the mail If you have any lab test that is abnormal or we need to change your treatment, we will call you to review the results.   Testing/Procedures: None   Follow-Up: At [facility], you and your health needs are our priority.  As part of our continuing mission to provide you with exceptional heart care, we have created designated Provider Care Teams.  These Care Teams include your primary Cardiologist (physician) and Advanced Practice Providers (APPs -  Physician Assistants and Nurse Practitioners) who all work together to provide you with the care you need, when you need it.  We recommend signing up for the patient portal called "MyChart".  Sign up information is provided on this After Visit Summary.  MyChart is used to connect with patients for Virtual Visits (Telemedicine).  Patients are able to view lab/test results, encounter notes, upcoming appointments, etc.  Non-urgent messages can be sent to your provider as well.   To learn more about what you can do with MyChart, go to [email or web address].    Your next appointment:   3 month on 05/07/2020 at 1:40 PM  The format for your next appointment:   In Person  Provider:   Carlyle Dolly, MD   Other Instructions If your swelling does not improve call our office to schedule an office visit for next week.

## 2020-02-03 DIAGNOSIS — L97811 Non-pressure chronic ulcer of other part of right lower leg limited to breakdown of skin: Secondary | ICD-10-CM | POA: Diagnosis not present

## 2020-02-03 DIAGNOSIS — I87313 Chronic venous hypertension (idiopathic) with ulcer of bilateral lower extremity: Secondary | ICD-10-CM | POA: Diagnosis not present

## 2020-02-03 DIAGNOSIS — E876 Hypokalemia: Secondary | ICD-10-CM | POA: Diagnosis not present

## 2020-02-03 DIAGNOSIS — N189 Chronic kidney disease, unspecified: Secondary | ICD-10-CM | POA: Diagnosis not present

## 2020-02-03 DIAGNOSIS — I509 Heart failure, unspecified: Secondary | ICD-10-CM | POA: Diagnosis not present

## 2020-02-03 DIAGNOSIS — E782 Mixed hyperlipidemia: Secondary | ICD-10-CM | POA: Diagnosis not present

## 2020-02-03 DIAGNOSIS — I2510 Atherosclerotic heart disease of native coronary artery without angina pectoris: Secondary | ICD-10-CM | POA: Diagnosis not present

## 2020-02-03 DIAGNOSIS — I130 Hypertensive heart and chronic kidney disease with heart failure and stage 1 through stage 4 chronic kidney disease, or unspecified chronic kidney disease: Secondary | ICD-10-CM | POA: Diagnosis not present

## 2020-02-03 DIAGNOSIS — L97821 Non-pressure chronic ulcer of other part of left lower leg limited to breakdown of skin: Secondary | ICD-10-CM | POA: Diagnosis not present

## 2020-02-03 DIAGNOSIS — E0520 Thyrotoxicosis with toxic multinodular goiter without thyrotoxic crisis or storm: Secondary | ICD-10-CM | POA: Diagnosis not present

## 2020-02-03 DIAGNOSIS — D631 Anemia in chronic kidney disease: Secondary | ICD-10-CM | POA: Diagnosis not present

## 2020-02-03 DIAGNOSIS — I4891 Unspecified atrial fibrillation: Secondary | ICD-10-CM | POA: Diagnosis not present

## 2020-02-03 DIAGNOSIS — E039 Hypothyroidism, unspecified: Secondary | ICD-10-CM | POA: Diagnosis not present

## 2020-02-15 DIAGNOSIS — S0501XD Injury of conjunctiva and corneal abrasion without foreign body, right eye, subsequent encounter: Secondary | ICD-10-CM | POA: Diagnosis not present

## 2020-02-16 DIAGNOSIS — S0501XD Injury of conjunctiva and corneal abrasion without foreign body, right eye, subsequent encounter: Secondary | ICD-10-CM | POA: Diagnosis not present

## 2020-02-18 DIAGNOSIS — D649 Anemia, unspecified: Secondary | ICD-10-CM | POA: Diagnosis not present

## 2020-02-18 DIAGNOSIS — R972 Elevated prostate specific antigen [PSA]: Secondary | ICD-10-CM | POA: Diagnosis not present

## 2020-02-18 DIAGNOSIS — I2510 Atherosclerotic heart disease of native coronary artery without angina pectoris: Secondary | ICD-10-CM | POA: Diagnosis not present

## 2020-02-18 DIAGNOSIS — R338 Other retention of urine: Secondary | ICD-10-CM | POA: Diagnosis not present

## 2020-02-18 DIAGNOSIS — K436 Other and unspecified ventral hernia with obstruction, without gangrene: Secondary | ICD-10-CM | POA: Diagnosis not present

## 2020-02-18 DIAGNOSIS — I10 Essential (primary) hypertension: Secondary | ICD-10-CM | POA: Diagnosis not present

## 2020-02-18 DIAGNOSIS — N401 Enlarged prostate with lower urinary tract symptoms: Secondary | ICD-10-CM | POA: Diagnosis not present

## 2020-02-18 DIAGNOSIS — I87313 Chronic venous hypertension (idiopathic) with ulcer of bilateral lower extremity: Secondary | ICD-10-CM | POA: Diagnosis not present

## 2020-02-18 DIAGNOSIS — I119 Hypertensive heart disease without heart failure: Secondary | ICD-10-CM | POA: Diagnosis not present

## 2020-02-18 DIAGNOSIS — E039 Hypothyroidism, unspecified: Secondary | ICD-10-CM | POA: Diagnosis not present

## 2020-02-18 DIAGNOSIS — E0590 Thyrotoxicosis, unspecified without thyrotoxic crisis or storm: Secondary | ICD-10-CM | POA: Diagnosis not present

## 2020-02-21 DIAGNOSIS — B0233 Zoster keratitis: Secondary | ICD-10-CM | POA: Diagnosis not present

## 2020-03-02 DIAGNOSIS — I129 Hypertensive chronic kidney disease with stage 1 through stage 4 chronic kidney disease, or unspecified chronic kidney disease: Secondary | ICD-10-CM | POA: Diagnosis not present

## 2020-03-02 DIAGNOSIS — I110 Hypertensive heart disease with heart failure: Secondary | ICD-10-CM | POA: Diagnosis not present

## 2020-03-02 DIAGNOSIS — I480 Paroxysmal atrial fibrillation: Secondary | ICD-10-CM | POA: Diagnosis not present

## 2020-03-02 DIAGNOSIS — E039 Hypothyroidism, unspecified: Secondary | ICD-10-CM | POA: Diagnosis not present

## 2020-03-09 DIAGNOSIS — M7741 Metatarsalgia, right foot: Secondary | ICD-10-CM | POA: Diagnosis not present

## 2020-03-09 DIAGNOSIS — G629 Polyneuropathy, unspecified: Secondary | ICD-10-CM | POA: Diagnosis not present

## 2020-03-09 DIAGNOSIS — L851 Acquired keratosis [keratoderma] palmaris et plantaris: Secondary | ICD-10-CM | POA: Diagnosis not present

## 2020-04-09 ENCOUNTER — Telehealth: Payer: Self-pay

## 2020-04-09 NOTE — Telephone Encounter (Signed)
I connected by phone with [name] and/or patient's caregiver on 04/09/2020 at 9:03 AM to discuss the potential vaccination through our Homebound vaccination initiative.   Prevaccination Checklist for COVID-19 Vaccines  1.  Are you feeling sick today? no  2.  Have you ever received a dose of a COVID-19 vaccine?  no      If yes, which one? Pfizer   How many dose of Covid-19 vaccine have your received and dates ? 2, 06/03/2019, 07/21/2019   Check all that apply: I live in a long-term care setting. no  I have been diagnosed with a medical condition(s). Please list: Multiple medical conditions (pertinent to homebound status)  I am a first responder. no  I work in a long-term care facility, correctional facility, hospital, restaurant, retail setting, school, or other setting with high exposure to the public. no  4. Do you have a health condition or are you undergoing treatment that makes you moderately or severely immunocompromised? (This would include treatment for cancer or HIV, receipt of organ transplant, immunosuppressive therapy or high-dose corticosteroids, CAR-T-cell therapy, hematopoietic cell transplant [HCT], DiGeorge syndrome or Wiskott-Aldrich syndrome)  no  5. Have you received hematopoietic cell transplant (HCT) or CAR-T-cell therapies since receiving COVID-19 vaccine? no  6.  Have you ever had an allergic reaction: (This would include a severe reaction [ e.g., anaphylaxis] that required treatment with epinephrine or EpiPen or that caused you to go to the hospital.  It would also include an allergic reaction that occurred within 4 hours that caused hives, swelling, or respiratory distress, including wheezing.) A.  A previous dose of COVID-19 vaccine. Yes, Indomethacin, Iodine B.  A vaccine or injectable therapy that contains multiple components, one of which is a COVID-19 vaccine component, but it is not known which component elicited the immediate reaction. no  C.  Are you allergic  to polyethylene glycol? no  D. Are you allergic to Polysorbate, which is found in some vaccines, film coated tablets and intravenous steroids?  no   7.  Have you ever had an allergic reaction to another vaccine (other than COVID-19 vaccine) or an injectable medication? (This would include a severe reaction [ e.g., anaphylaxis] that required treatment with epinephrine or EpiPen or that caused you to go to the hospital.  It would also include an allergic reaction that occurred within 4 hours that caused hives, swelling, or respiratory distress, including wheezing.)  no  8.  Have you ever had a severe allergic reaction (e.g., anaphylaxis) to something other than a component of the COVID-19 vaccine, or any vaccine or injectable medication?  This would include food, pet, venom, environmental, or oral medication allergies.  yes, Indomethacin, Iodine  Check all that apply to you:  Am a male between ages 49 and 53 years old  no  Women 56 through 83 years of age can receive any FDA-authorized or -approved COVID-19 vaccine. However, they should be informed of the rare but increased risk of thrombosis with thrombocytopenia syndrome (TTS) after receipt of the [employer] Vaccine and the availability of other FDA-authorized and -approved COVID-19 vaccines. People who had TTS after a first dose of Janssen vaccine should not receive a subsequent dose of Janssen product    Am a male between ages 81 and 65 years old  no Males 5 through 83 years of age may receive the correct formulation of Pfizer-BioNTech COVID-19 vaccine. Males 18 and older can receive any FDA-authorized or -approved vaccine. However, people receiving an mRNA COVID-19  vaccine, especially males 56 through 83 years of age and their parents/legal representative (when relevant), should be informed of the risk of developing myocarditis (an inflammation of the heart muscle) or pericarditis (inflammation of the lining around the heart) after receipt of  an mRNA vaccine. The risk of developing either myocarditis or pericarditis after vaccination is low, and lower than the risk of myocarditis associated with SARS-CoV-2 infection in adolescents and adults. Vaccine recipients should be counseled about the need to seek care if symptoms of myocarditis or pericarditis develop after vaccination     Have a history of myocarditis or pericarditis  no Myocarditis or pericarditis after receipt of the first dose of an mRNA COVID-19 vaccine series but before administration of the second dose  Experts advise that people who develop myocarditis or pericarditis after a dose of an mRNA COVID-19 vaccine not receive a subsequent dose of any COVID-19 vaccine, until additional safety data are available.  Administration of a subsequent dose of COVID-19 vaccine before safety data are available can be considered in certain circumstances after the episode of myocarditis or pericarditis has completely resolved. Until additional data are available, some experts recommend a Alphonsa Overall COVID-19 vaccine be considered instead of an mRNA COVID-19 vaccine. Decisions about proceeding with a subsequent dose should include a conversation between the patient, their parent/legal representative (when relevant), and their clinical team, which may include a cardiologist.    Have been treated with monoclonal antibodies or convalescent serum to prevent or treat COVID-19  no Vaccination should be offered to people regardless of history of prior symptomatic or asymptomatic SARS-CoV-2 infection. There is no recommended minimal interval between infection and vaccination.  However, vaccination should be deferred if a patient received monoclonal antibodies or convalescent serum as treatment for COVID-19 or for post-exposure prophylaxis. This is a precautionary measure until additional information becomes available, to avoid interference of the antibody treatment with vaccine-induced immune responses.  Defer  COVID-19 vaccination for 30 days when a passive antibody product was used for post-exposure prophylaxis.  Defer COVID-19 vaccination for 90 days when a passive antibody product was used to treat COVID-19.     Diagnosed with Multisystem Inflammatory Syndrome (MIS-C or MIS-A) after a COVID-19 infection  no It is unknown if people with a history of MIS-C or MIS-A are at risk for a dysregulated immune response to COVID-19 vaccination.  People with a history of MIS-C or MIS-A may choose to be vaccinated. Considerations for vaccination may include:   Clinical recovery from MIS-C or MIS-A, including return to normal cardiac function   Personal risk of severe acute COVID-19 (e.g., age, underlying conditions)   High or substantial community transmission of SARS-CoV-2 and personal increased risk of reinfection.   Timing of any immunomodulatory therapies (general best practice guidelines for immunization can be consulted for more information [email or web address])   It has been 90 days or more since their diagnosis of MIS-C   Onset of MIS-C occurred before any COVID-19 vaccination   A conversation between the patient, their guardian(s), and their clinical team or a specialist may assist with COVID-19 vaccination decisions. Healthcare providers and health departments may also request a consultation from the [place] at [email or web address].     Have a bleeding disorder  no Take a blood thinner  no As with all vaccines, any COVID-19 vaccine product may be given to these patients, if a physician familiar with the patient's bleeding risk determines that the vaccine can be administered intramuscularly with reasonable safety.  ACIP recommends the following technique for intramuscular vaccination in patients with bleeding disorders or taking blood thinners: a fine-gauge needle  (23-gauge or smaller caliber) should be used for the vaccination, followed by firm pressure on the site, without rubbing, for at least 2 minutes.  People who regularly take aspirin or anticoagulants as part of their routine medications do not need to stop these medications prior to receipt of any COVID-19 vaccine.    Have a history of heparin-induced thrombocytopenia (HIT)  no Although the etiology of TTS associated with the Alphonsa Overall COVID-19 vaccine is unclear, it appears to be similar to another rare immune-mediated syndrome, heparin-induced thrombocytopenia (HIT). People with a history of an episode of an immune-mediated syndrome characterized by thrombosis and thrombocytopenia, such as HIT, should be offered a currently FDA-approved or FDA-authorized mRNA COVID-19 vaccine if it has been ?90 days since their TTS resolved. After 90 days, patients may be vaccinated with any currently FDA-approved or FDA-authorized COVID-19 vaccine, including Janssen COVID-19 Vaccine. However, people who developed TTS after their initial Alphonsa Overall vaccine should not receive a Janssen booster dose.  Experts believe the following factors do not make people more susceptible to TTS after receipt of the [employer]. People with these conditions can be vaccinated with any FDA-authorized or - approved COVID-19 vaccine, including the [employer] COVID-19 Vaccine:   A prior history of venous thromboembolism   Risk factors for venous thromboembolism (e.g., inherited or acquired thrombophilia including Factor V Leiden; prothrombin gene 20210A mutation; antiphospholipid syndrome; protein C, protein S or antithrombin deficiency   A prior history of other types of thromboses not associated with thrombocytopenia   Pregnancy, post-partum status, or receipt of hormonal contraceptives (e.g., combined oral contraceptives, patch, ring)   Additional recipient education materials can be found at [email or web address]-[email or web address]  vaccines/safety/JJUpdate.html.    Am currently pregnant or breastfeeding  no Vaccination is recommended for all people aged 68 years and older, including people that are:   Pregnant   Breastfeeding   Trying to get pregnant now or who might become pregnant in the future   Pregnant, breastfeeding, and post-partum people 31 through 83 years of age should be aware of the rare risk of TTS after receipt of the Alphonsa Overall COVID-19 Vaccine and the availability of other FDA-authorized or -approved COVID-19 vaccines (i.e., mRNA vaccines).    Have received dermal fillers  no FDA-authorized or -approved COVID-19 vaccines can be administered to people who have received injectable dermal fillers who have no contraindications for vaccination.  Infrequently, these people might experience temporary swelling at or near the site of filler injection (usually the face or lips) following administration of a dose of an mRNA COVID-19 vaccine. These people should be advised to contact their healthcare provider if swelling develops at or near the site of dermal filler following vaccination.     Have a history of Guillain-Barr Syndrome (GBS)  no People with a history of GBS can receive any FDA-authorized or -approved COVID-19 vaccine. However, given the possible association between the [employer] and an increased risk of GBS, a patient with a history of GBS and their clinical team should discuss the availability of mRNA vaccines to offer protection against COVID-19. The highest risk has been observed in men aged 21-64 years with symptoms of GBS beginning within 42 days after Alphonsa Overall COVID-19 vaccination.  People who had GBS after receiving Janssen vaccine should be made aware of the option to receive an mRNA COVID-19 vaccine booster at least 2 months (8 weeks) after  the Janssen dose. However, Alphonsa Overall vaccine may be used as a booster, particularly if GBS occurred more than 42 days after vaccination or was related  to a non-vaccine factor. Prior to booster vaccination, a conversation between the patient and their clinical team may assist with decisions about use of a COVID-19 booster dose, including the timing of administration     Postvaccination Observation Times for People without Contraindications to Covid 19 Vaccination.  30 minutes:  People with a history of: A contraindication to another type of COVID-19 vaccine product (i.e., mRNA or viral vector COVID-19 vaccines)   Immediate (within 4 hours of exposure) non-severe allergic reaction to a COVID-19 vaccine or injectable therapies   Anaphylaxis due to any cause   Immediate allergic reaction of any severity to a non-COVID-19 vaccine   15 minutes: All other people  This patient is a 85 y.o. male that meets the FDA criteria to receive homebound vaccination. Patient or parent/caregiver understands they have the option to accept or refuse homebound vaccination.  Patient passed the pre-screening checklist and would like to proceed with homebound vaccination.  Based on questionnaire above, I recommend the patient be observed for 15 minutes.  There are an estimated #0 other household members/caregivers who are also interested in receiving the vaccine.    The patient has been confirmed homebound and eligible for homebound vaccination with the considerations outlined above. I will send the patient's information to our scheduling team who will reach out to schedule the patient and potential caregiver/family members for homebound vaccination.    Dan Humphreys 04/09/2020 9:03 AM

## 2020-04-18 ENCOUNTER — Ambulatory Visit: Attending: Critical Care Medicine

## 2020-04-18 ENCOUNTER — Other Ambulatory Visit: Payer: Self-pay

## 2020-04-18 DIAGNOSIS — Z23 Encounter for immunization: Secondary | ICD-10-CM

## 2020-04-18 NOTE — Progress Notes (Signed)
   Covid-19 Vaccination Clinic  Name:  JEDIAH HORGER    MRN: [id number] DOB: August 11, 1935  04/18/2020  Mr. Besson was observed post Covid-19 immunization for 15 minutes without incident. He was provided with Vaccine Information Sheet and instruction to access the V-Safe system.   Mr. Sultana was instructed to call 911 with any severe reactions post vaccine: Marland Kitchen Difficulty breathing  . Swelling of face and throat  . A fast heartbeat  . A bad rash all over body  . Dizziness and weakness   Immunizations Administered    Name Date Dose VIS Date Route   PFIZER Comrnaty(Gray TOP) Covid-19 Vaccine 04/18/2020 12:30 PM 0.3 mL 12/29/2019 Intramuscular   Manufacturer: [employer]   Lot: [id number]   NDC: [phone]

## 2020-05-07 ENCOUNTER — Ambulatory Visit: Payer: PPO | Admitting: Cardiology

## 2020-05-15 NOTE — Progress Notes (Deleted)
Cardiology Office Note    Date:  05/28/2020   ID:  Derek [place], DOB [birth year], MRN [id number]   PCP:  Celene Squibb, [place]  Cardiologist:  None  Advanced Practice Provider:  No care team member to display Electrophysiologist:  None   [id number]}   No chief complaint on file.   History of Present Illness:  Derek [place] is a 85 y.o. male  with a hx of CAD, hx MI PCI to LAD 2011, HTN, HLD, PAF in the setting of COVID-19 and Small bowel obstruction 01/2019, hyperthyroid       Patient had hernia repair 06/2019 and Eliquis was on hold.  He then developed guaiac positive stools and hemoglobin ranging in the 8-8.5 range.  Eliquis was never restarted.  He had maintained normal sinus rhythm on amiodarone.  I had a telemedicine visit 02/01/20 and Lasix increased 40 mg daily for leg edema.   Patient comes in for f/u. BP high today but usually 093 systolic. Hospice checks him twice weekly. Denies chest pain, palpitations. Still has leg swelling. Watches his salt closely. Biggest problem is mobility and stability. Patient says hospice Dr. Increased metoprolol 50 mg bid. Amlodipine stopped because of swelling but his swelling didn't go down.     Past Medical History:  Diagnosis Date  . AKI (acute kidney injury) ([place]) 02/12/2019  . CAD (coronary artery disease)    a. cath 01/27/2009 : s/p promus DES to LAD and medical managment of 70-80% mRCA  . COVID-19   . Essential hypertension   . Hypothyroidism   . Mixed hyperlipidemia   . NSTEMI (non-ST elevated myocardial infarction) ([place]) 2011  . PAF (paroxysmal atrial fibrillation) ([place])     Past Surgical History:  Procedure Laterality Date  . BOWEL RESECTION N/A 06/28/2019   Procedure: SMALL BOWEL RESECTION;  Surgeon: Virl Cagey, MD;  Location: [facility];  Service: Derek;  Laterality: N/A;  . CHOLECYSTECTOMY    . CORONARY STENT PLACEMENT    . CYSTOSCOPY/RETROGRADE/URETEROSCOPY Bilateral 03/09/2019    Procedure: CYSTOSCOPY, Clot Evacuation, Fulguration;  Surgeon: Festus Aloe, MD;  Location: [facility];  Service: Urology;  Laterality: Bilateral;  . HERNIA REPAIR    . LYSIS OF ADHESION N/A 06/28/2019   Procedure: LYSIS OF ADHESION;  Surgeon: Virl Cagey, MD;  Location: [facility];  Service: Derek;  Laterality: N/A;  . VENTRAL HERNIA REPAIR N/A 06/28/2019   Procedure: HERNIA REPAIR VENTRAL ADULT WITH VICRYL MESH OVERLAY;  Surgeon: Virl Cagey, MD;  Location: [facility];  Service: Derek;  Laterality: N/A;    Current Medications: Current Meds  Medication Sig  . amiodarone (PACERONE) 200 MG tablet Take 1 tablet (200 mg total) by mouth daily.  Marland Kitchen amLODipine (NORVASC) 5 MG tablet Take 1 tablet (5 mg total) by mouth daily.  Marland Kitchen aspirin EC 81 MG tablet Take 81 mg by mouth daily. Swallow whole.  . Coenzyme Q10 200 MG capsule Take 200 mg by mouth daily.  . finasteride (PROSCAR) 5 MG tablet Take 1 tablet (5 mg total) by mouth daily.  . furosemide (LASIX) 20 MG tablet Take 2 tablets (40 mg total) by mouth daily.  Marland Kitchen gabapentin (NEURONTIN) 100 MG capsule Take 100 mg by mouth daily.  . methimazole (TAPAZOLE) 10 MG tablet Take 2 tablets (20 mg total) by mouth 2 (two) times daily.  . metoprolol tartrate (LOPRESSOR) 25 MG tablet Take 12.5 mg by mouth 2 (two) times daily.  . Multiple  Vitamin (MULTIVITAMIN) tablet Take 1 tablet by mouth daily.  . nitroGLYCERIN (NITROSTAT) 0.4 MG SL tablet Place 1 tablet (0.4 mg total) under the tongue every 5 (five) minutes as needed.  . NON FORMULARY Diet - NAS  . ofloxacin (OCUFLOX) 0.3 % ophthalmic solution Place 1 drop into both eyes 4 (four) times daily.  . potassium chloride (KLOR-CON) 10 MEQ tablet Take 1 tablet (10 mEq total) by mouth every evening.  . pravastatin (PRAVACHOL) 10 MG tablet Take 1 tablet (10 mg total) by mouth daily at 6 PM.  . tamsulosin (FLOMAX) 0.4 MG CAPS capsule Take 1 capsule (0.4 mg total) by mouth daily after breakfast.  . valACYclovir  (VALTREX) 500 MG tablet Take 500 mg by mouth daily.     Allergies:   Indomethacin and Iodine-131   Social History   Socioeconomic History  . Marital status: Married    Spouse name: Not on file  . Number of children: Not on file  . Years of education: Not on file  . Highest education level: Not on file  Occupational History  . Occupation: Charity fundraiser  Tobacco Use  . Smoking status: Former Smoker    Quit date: 01/20/1957    Years since quitting: 63.3  . Smokeless tobacco: Never Used  Vaping Use  . Vaping Use: Never used  Substance and Sexual Activity  . Alcohol use: No  . Drug use: Never  . Sexual activity: Not on file  Other Topics Concern  . Not on file  Social History Narrative   Quit smoking about 62 years ago.   Social Determinants of Health   Financial Resource Strain: Not on file  Food Insecurity: Not on file  Transportation Needs: Not on file  Physical Activity: Not on file  Stress: Not on file  Social Connections: Not on file     Family History:  The patient's family history includes Heart attack in his father; Lung cancer in his sister.   ROS:   Please see the history of present illness.    ROS All other systems reviewed and are negative.   PHYSICAL EXAM:   VS:  BP (!) 160/70   Pulse 63   Ht 6' (1.829 m)   Wt (!) 316 lb (143.3 kg)   SpO2 97%   BMI 42.86 kg/m   Physical Exam  GEN: Obese, in no acute distress  Neck: no JVD, carotid bruits, or masses Cardiac:RRR; no murmurs, rubs, or gallops  Respiratory:  clear to auscultation bilaterally, normal work of breathing GI: soft, nontender, nondistended, + BS Ext: plus 2-3 edema without cyanosis, clubbing,Decreased distal pulses bilaterally Neuro:  Alert and Oriented x 3 Psych: euthymic mood, full affect  Wt Readings from Last 3 Encounters:  05/28/20 (!) 316 lb (143.3 kg)  02/01/20 280 lb (127 kg)  08/19/19 (!) 270 lb (122.5 kg)      Studies/Labs Reviewed:   EKG:  EKG is not ordered today.    Recent Labs: 06/30/2019: B Natriuretic Peptide 132.0 07/04/2019: Magnesium 1.8 07/11/2019: ALT 11; BUN 20; Creatinine, Ser 1.69; Platelets 186; Sodium 140 07/18/2019: Hemoglobin 8.7; Potassium 3.3   Lipid Panel    Component Value Date/Time   CHOL 138 05/28/2011 1030   TRIG 81 05/28/2011 1030   HDL 43 05/28/2011 1030   CHOLHDL 3.2 05/28/2011 1030   VLDL 16 05/28/2011 1030   LDLCALC 79 05/28/2011 1030    Additional studies/ records that were reviewed today include:  Echo 02/13/2019- IMPRESSIONS     1. Left ventricular  ejection fraction, by visual estimation, is 60 to  65%. The left ventricle has normal function. There is moderately increased  left ventricular hypertrophy.   2. Definity contrast agent was given IV to delineate the left ventricular  endocardial borders.   3. Left ventricular diastolic parameters are indeterminate.   4. The left ventricle has no regional wall motion abnormalities.   5. Global right ventricle has normal systolic function.The right  ventricular size is normal. Right vetricular wall thickness was not  assessed.   6. Left atrial size was severely dilated.   7. Right atrial size was normal.   8. The mitral valve is grossly normal. No evidence of mitral valve  regurgitation.   9. The tricuspid valve is grossly normal.  10. The tricuspid valve is grossly normal. Tricuspid valve regurgitation  is mild.  11. The aortic valve is tricuspid. Aortic valve regurgitation is not  visualized. No evidence of aortic valve sclerosis or stenosis.  12. The pulmonic valve was grossly normal. Pulmonic valve regurgitation is  not visualized.  13. Mildly elevated pulmonary artery systolic pressure.  14. The interatrial septum was not well visualized.         Risk Assessment/Calculations:    CHA2DS2-VASc Score = 5  {Confirm score is correct.  If not, click here to update score.  REFRESH note. :[id number]}BMWU indicates a 7.2% annual risk of stroke. The patient's  score is based upon: CHF History: Yes HTN History: Yes Diabetes History: No Stroke History: No Vascular Disease History: Yes Age Score: 2 Gender Score: 0   {This patient has a significant risk of stroke if diagnosed with atrial fibrillation.  Please consider VKA or DOAC agent for anticoagulation if the bleeding risk is acceptable.   You can also use the SmartPhrase .[place] for documentation.   :[id number]}     ASSESSMENT:    1. Coronary artery disease involving native coronary artery of native heart without angina pectoris   2. PAF (paroxysmal atrial fibrillation) ([place])   3. Bilateral lower extremity edema   4. Essential hypertension   5. Hyperlipidemia, unspecified hyperlipidemia type      PLAN:  In order of problems listed above:   CAD status post DES to the LAD in 2011, no active angina at this time. LVEF 60 to 65% by echocardiogram in January of this year.  On Lopressor and Pravachol, ASA 81 mg daily  No angina.     Paroxysmal atrial fibrillation in the setting of Covid19 infection and hyperthyroidism 01/2019. CHA2DS2-VASc score of   Eliquis has been on hold with surgery in June 2021 but never resumed b/c of positve stools and hgb 8-8.5 resume prior to d/c. On amiodarone. Had labs done by Dr. Nevada Crane last month-will request copy.    Lower extremity edema-lasix increased by PCP with some improvement. Amlodipine stopped with no improvement in edema. Watches salt closely. On lasix 20 mg 2 daily. Crt 1.64 in 12/2019-just had labs last month-request copy.   HTN BP running high here-usually 140's at home. Edema didn't improve with stopping amlodipine. Will restart at 5 mg daily.   HLD labs checked by PCP on pravachol  Obesity-weight up 36 lbs since Jan. Some is fluid but he says he overeats since out of nursing home. Decrease portions in diet.     Shared Decision Making/Informed Consent        Medication Adjustments/Labs and Tests Ordered: Current medicines are reviewed  at length with the patient today.  Concerns regarding medicines are outlined above.  Medication changes, Labs and Tests ordered today are listed in the Patient Instructions below. Patient Instructions  Medication Instructions:  Your physician has recommended you make the following change in your medication:   Restart Norvasc 5 mg Daily   *If you need a refill on your cardiac medications before your next appointment, please call your pharmacy*   Lab Work: NONE   If you have labs (blood work) drawn today and your tests are completely normal, you will receive your results only by: Marland Kitchen MyChart Message (if you have MyChart) OR . A paper copy in the mail If you have any lab test that is abnormal or we need to change your treatment, we will call you to review the results.   Testing/Procedures: NONE    Follow-Up: At [facility], you and your health needs are our priority.  As part of our continuing mission to provide you with exceptional heart care, we have created designated Provider Care Teams.  These Care Teams include your primary Cardiologist (physician) and Advanced Practice Providers (APPs -  Physician Assistants and Nurse Practitioners) who all work together to provide you with the care you need, when you need it.  We recommend signing up for the patient portal called "MyChart".  Sign up information is provided on this After Visit Summary.  MyChart is used to connect with patients for Virtual Visits (Telemedicine).  Patients are able to view lab/test results, encounter notes, upcoming appointments, etc.  Non-urgent messages can be sent to your provider as well.   To learn more about what you can do with MyChart, go to [email or web address].    Your next appointment:    Next Available   The format for your next appointment:   In Person  Provider:   Rozann Lesches, MD or Carlyle Dolly, MD   Other Instructions Thank you for choosing [facility]!        Signed, Ermalinda Barrios, PA-C  05/28/2020 1:47 PM    [facility] [place], [place],   [address] Phone: [phone]; Fax: [phone]

## 2020-05-28 ENCOUNTER — Other Ambulatory Visit: Payer: Self-pay

## 2020-05-28 ENCOUNTER — Ambulatory Visit: Admitting: Physician Assistant

## 2020-05-28 ENCOUNTER — Encounter: Payer: Self-pay | Admitting: Physician Assistant

## 2020-05-28 ENCOUNTER — Encounter: Payer: Self-pay | Admitting: *Deleted

## 2020-05-28 VITALS — BP 160/70 | HR 63 | Ht 72.0 in | Wt 316.0 lb

## 2020-05-28 DIAGNOSIS — I480 Paroxysmal atrial fibrillation: Secondary | ICD-10-CM | POA: Diagnosis not present

## 2020-05-28 DIAGNOSIS — I10 Essential (primary) hypertension: Secondary | ICD-10-CM | POA: Diagnosis not present

## 2020-05-28 DIAGNOSIS — E785 Hyperlipidemia, unspecified: Secondary | ICD-10-CM

## 2020-05-28 DIAGNOSIS — R600 Localized edema: Secondary | ICD-10-CM

## 2020-05-28 DIAGNOSIS — I2510 Atherosclerotic heart disease of native coronary artery without angina pectoris: Secondary | ICD-10-CM | POA: Diagnosis not present

## 2020-05-28 MED ORDER — AMLODIPINE BESYLATE 5 MG PO TABS
5.0000 mg | ORAL_TABLET | Freq: Every day | ORAL | 3 refills | Status: DC
Start: 2020-05-28 — End: 2020-08-17

## 2020-05-28 NOTE — Progress Notes (Signed)
Cardiology Office Note    Date:  05/28/2020   ID:  Derek [place], DOB 03/21/1935, MRN [id number]   PCP:  Celene Squibb, [place]  Cardiologist:  None  Advanced Practice Provider:  No care team member to display Electrophysiologist:  None   [id number]}   No chief complaint on file.   History of Present Illness:  Derek [place] is a 85 y.o. male  with a hx of CAD, hx MI PCI to LAD 2011, HTN, HLD, PAF in the setting of COVID-19 and Small bowel obstruction 01/2019, hyperthyroid       Patient had hernia repair 06/2019 and Eliquis was on hold.  He then developed guaiac positive stools and hemoglobin ranging in the 8-8.5 range.  Eliquis was never restarted.  He had maintained normal sinus rhythm on amiodarone.  I had a telemedicine visit 02/01/20 and Lasix increased 40 mg daily for leg edema.   Patient comes in for f/u. BP high today but usually 956 systolic. Hospice checks him twice weekly. Denies chest pain, palpitations. Still has leg swelling. Watches his salt closely. Biggest problem is mobility and stability. Patient says hospice Dr. Increased metoprolol 50 mg bid. Amlodipine stopped because of swelling but his swelling didn't go down.     Past Medical History:  Diagnosis Date  . AKI (acute kidney injury) ([place]) 02/12/2019  . CAD (coronary artery disease)    a. cath 01/27/2009 : s/p promus DES to LAD and medical managment of 70-80% mRCA  . COVID-19   . Essential hypertension   . Hypothyroidism   . Mixed hyperlipidemia   . NSTEMI (non-ST elevated myocardial infarction) ([place]) 2011  . PAF (paroxysmal atrial fibrillation) ([place])     Past Surgical History:  Procedure Laterality Date  . BOWEL RESECTION N/A 06/28/2019   Procedure: SMALL BOWEL RESECTION;  Surgeon: Virl Cagey, MD;  Location: [facility];  Service: General;  Laterality: N/A;  . CHOLECYSTECTOMY    . CORONARY STENT PLACEMENT    . CYSTOSCOPY/RETROGRADE/URETEROSCOPY Bilateral 03/09/2019    Procedure: CYSTOSCOPY, Clot Evacuation, Fulguration;  Surgeon: Festus Aloe, MD;  Location: [facility];  Service: Urology;  Laterality: Bilateral;  . HERNIA REPAIR    . LYSIS OF ADHESION N/A 06/28/2019   Procedure: LYSIS OF ADHESION;  Surgeon: Virl Cagey, MD;  Location: [facility];  Service: General;  Laterality: N/A;  . VENTRAL HERNIA REPAIR N/A 06/28/2019   Procedure: HERNIA REPAIR VENTRAL ADULT WITH VICRYL MESH OVERLAY;  Surgeon: Virl Cagey, MD;  Location: [facility];  Service: General;  Laterality: N/A;    Current Medications: Current Meds  Medication Sig  . amiodarone (PACERONE) 200 MG tablet Take 1 tablet (200 mg total) by mouth daily.  Marland Kitchen amLODipine (NORVASC) 5 MG tablet Take 1 tablet (5 mg total) by mouth daily.  Marland Kitchen aspirin EC 81 MG tablet Take 81 mg by mouth daily. Swallow whole.  . Coenzyme Q10 200 MG capsule Take 200 mg by mouth daily.  . finasteride (PROSCAR) 5 MG tablet Take 1 tablet (5 mg total) by mouth daily.  . furosemide (LASIX) 20 MG tablet Take 2 tablets (40 mg total) by mouth daily.  Marland Kitchen gabapentin (NEURONTIN) 100 MG capsule Take 100 mg by mouth daily.  . methimazole (TAPAZOLE) 10 MG tablet Take 2 tablets (20 mg total) by mouth 2 (two) times daily.  . metoprolol tartrate (LOPRESSOR) 25 MG tablet Take 12.5 mg by mouth 2 (two)  times daily.  . Multiple Vitamin (MULTIVITAMIN) tablet Take 1 tablet by mouth daily.  . nitroGLYCERIN (NITROSTAT) 0.4 MG SL tablet Place 1 tablet (0.4 mg total) under the tongue every 5 (five) minutes as needed.  . NON FORMULARY Diet - NAS  . ofloxacin (OCUFLOX) 0.3 % ophthalmic solution Place 1 drop into both eyes 4 (four) times daily.  . potassium chloride (KLOR-CON) 10 MEQ tablet Take 1 tablet (10 mEq total) by mouth every evening.  . pravastatin (PRAVACHOL) 10 MG tablet Take 1 tablet (10 mg total) by mouth daily at 6 PM.  . tamsulosin (FLOMAX) 0.4 MG CAPS capsule Take 1 capsule (0.4 mg total) by mouth daily after breakfast.  . valACYclovir  (VALTREX) 500 MG tablet Take 500 mg by mouth daily.     Allergies:   Indomethacin and Iodine-131   Social History   Socioeconomic History  . Marital status: Married    Spouse name: Not on file  . Number of children: Not on file  . Years of education: Not on file  . Highest education level: Not on file  Occupational History  . Occupation: Charity fundraiser  Tobacco Use  . Smoking status: Former Smoker    Quit date: 01/20/1957    Years since quitting: 63.3  . Smokeless tobacco: Never Used  Vaping Use  . Vaping Use: Never used  Substance and Sexual Activity  . Alcohol use: No  . Drug use: Never  . Sexual activity: Not on file  Other Topics Concern  . Not on file  Social History Narrative   Quit smoking about 62 years ago.   Social Determinants of Health   Financial Resource Strain: Not on file  Food Insecurity: Not on file  Transportation Needs: Not on file  Physical Activity: Not on file  Stress: Not on file  Social Connections: Not on file     Family History:  The patient's family history includes Heart attack in his father; Lung cancer in his sister.   ROS:   Please see the history of present illness.    ROS All other systems reviewed and are negative.   PHYSICAL EXAM:   VS:  BP (!) 160/70   Pulse 63   Ht 6' (1.829 m)   Wt (!) 316 lb (143.3 kg)   SpO2 97%   BMI 42.86 kg/m   Physical Exam  GEN: Obese, in no acute distress  Neck: no JVD, carotid bruits, or masses Cardiac:RRR; no murmurs, rubs, or gallops  Respiratory:  clear to auscultation bilaterally, normal work of breathing GI: soft, nontender, nondistended, + BS Ext: plus 2-3 edema without cyanosis, clubbing,Decreased distal pulses bilaterally Neuro:  Alert and Oriented x 3 Psych: euthymic mood, full affect  Wt Readings from Last 3 Encounters:  05/28/20 (!) 316 lb (143.3 kg)  02/01/20 280 lb (127 kg)  08/19/19 (!) 270 lb (122.5 kg)      Studies/Labs Reviewed:   EKG:  EKG is not ordered today.    Recent Labs: 06/30/2019: B Natriuretic Peptide 132.0 07/04/2019: Magnesium 1.8 07/11/2019: ALT 11; BUN 20; Creatinine, Ser 1.69; Platelets 186; Sodium 140 07/18/2019: Hemoglobin 8.7; Potassium 3.3   Lipid Panel    Component Value Date/Time   CHOL 138 05/28/2011 1030   TRIG 81 05/28/2011 1030   HDL 43 05/28/2011 1030   CHOLHDL 3.2 05/28/2011 1030   VLDL 16 05/28/2011 1030   LDLCALC 79 05/28/2011 1030    Additional studies/ records that were reviewed today include:  Echo 02/13/2019- IMPRESSIONS  1. Left ventricular ejection fraction, by visual estimation, is 60 to  65%. The left ventricle has normal function. There is moderately increased  left ventricular hypertrophy.   2. Definity contrast agent was given IV to delineate the left ventricular  endocardial borders.   3. Left ventricular diastolic parameters are indeterminate.   4. The left ventricle has no regional wall motion abnormalities.   5. Global right ventricle has normal systolic function.The right  ventricular size is normal. Right vetricular wall thickness was not  assessed.   6. Left atrial size was severely dilated.   7. Right atrial size was normal.   8. The mitral valve is grossly normal. No evidence of mitral valve  regurgitation.   9. The tricuspid valve is grossly normal.  10. The tricuspid valve is grossly normal. Tricuspid valve regurgitation  is mild.  11. The aortic valve is tricuspid. Aortic valve regurgitation is not  visualized. No evidence of aortic valve sclerosis or stenosis.  12. The pulmonic valve was grossly normal. Pulmonic valve regurgitation is  not visualized.  13. Mildly elevated pulmonary artery systolic pressure.  14. The interatrial septum was not well visualized.         Risk Assessment/Calculations:    CHA2DS2-VASc Score = 5  This indicates a 7.2% annual risk of stroke. The patient's score is based upon: CHF History: Yes HTN History: Yes Diabetes History: No Stroke  History: No Vascular Disease History: Yes Age Score: 2 Gender Score: 0        ASSESSMENT:    1. Coronary artery disease involving native coronary artery of native heart without angina pectoris   2. PAF (paroxysmal atrial fibrillation) ([place])   3. Bilateral lower extremity edema   4. Essential hypertension   5. Hyperlipidemia, unspecified hyperlipidemia type      PLAN:  In order of problems listed above:   CAD status post DES to the LAD in 2011, no active angina at this time. LVEF 60 to 65% by echocardiogram in January of this year.  On Lopressor and Pravachol, ASA 81 mg daily  No angina.     Paroxysmal atrial fibrillation in the setting of Covid19 infection and hyperthyroidism 01/2019. CHA2DS2-VASc score of   Eliquis has been on hold with surgery in June 2021 but never resumed b/c of positve stools and hgb 8-8.5 resume prior to d/c. On amiodarone. Had labs done by Dr. [place] Crane last month-will request copy.    Lower extremity edema-lasix increased by PCP with some improvement. Amlodipine stopped with no improvement in edema. Watches salt closely. On lasix 20 mg 2 daily. Crt 1.64 in 12/2019-just had labs last month-request copy.   HTN BP running high here-usually 140's at home. Edema didn't improve with stopping amlodipine. Will restart at 5 mg daily.   HLD labs checked by PCP on pravachol  Obesity-weight up 36 lbs since Jan. Some is fluid but he says he overeats since out of nursing home. Decrease portions in diet.     Shared Decision Making/Informed Consent        Medication Adjustments/Labs and Tests Ordered: Current medicines are reviewed at length with the patient today.  Concerns regarding medicines are outlined above.  Medication changes, Labs and Tests ordered today are listed in the Patient Instructions below. Patient Instructions  Medication Instructions:  Your physician has recommended you make the following change in your medication:   Restart Norvasc 5 mg Daily    *If you need a refill on your cardiac medications before your next  appointment, please call your pharmacy*   Lab Work: NONE   If you have labs (blood work) drawn today and your tests are completely normal, you will receive your results only by: Marland Kitchen MyChart Message (if you have MyChart) OR . A paper copy in the mail If you have any lab test that is abnormal or we need to change your treatment, we will call you to review the results.   Testing/Procedures: NONE    Follow-Up: At [facility], you and your health needs are our priority.  As part of our continuing mission to provide you with exceptional heart care, we have created designated Provider Care Teams.  These Care Teams include your primary Cardiologist (physician) and Advanced Practice Providers (APPs -  Physician Assistants and Nurse Practitioners) who all work together to provide you with the care you need, when you need it.  We recommend signing up for the patient portal called "MyChart".  Sign up information is provided on this After Visit Summary.  MyChart is used to connect with patients for Virtual Visits (Telemedicine).  Patients are able to view lab/test results, encounter notes, upcoming appointments, etc.  Non-urgent messages can be sent to your provider as well.   To learn more about what you can do with MyChart, go to [email or web address].    Your next appointment:    Next Available   The format for your next appointment:   In Person  Provider:   Rozann Lesches, MD or Carlyle Dolly, MD   Other Instructions Thank you for choosing [facility]!       Signed, Ermalinda Barrios, PA-C  05/28/2020 1:47 PM    [facility] [place], [place], [place]  [address] Phone: [phone]; Fax: [phone]

## 2020-05-28 NOTE — Patient Instructions (Signed)
Medication Instructions:  Your physician has recommended you make the following change in your medication:   Restart Norvasc 5 mg Daily   *If you need a refill on your cardiac medications before your next appointment, please call your pharmacy*   Lab Work: NONE   If you have labs (blood work) drawn today and your tests are completely normal, you will receive your results only by: Marland Kitchen MyChart Message (if you have MyChart) OR . A paper copy in the mail If you have any lab test that is abnormal or we need to change your treatment, we will call you to review the results.   Testing/Procedures: NONE    Follow-Up: At [facility], you and your health needs are our priority.  As part of our continuing mission to provide you with exceptional heart care, we have created designated Provider Care Teams.  These Care Teams include your primary Cardiologist (physician) and Advanced Practice Providers (APPs -  Physician Assistants and Nurse Practitioners) who all work together to provide you with the care you need, when you need it.  We recommend signing up for the patient portal called "MyChart".  Sign up information is provided on this After Visit Summary.  MyChart is used to connect with patients for Virtual Visits (Telemedicine).  Patients are able to view lab/test results, encounter notes, upcoming appointments, etc.  Non-urgent messages can be sent to your provider as well.   To learn more about what you can do with MyChart, go to [email or web address].    Your next appointment:    Next Available   The format for your next appointment:   In Person  Provider:   Rozann Lesches, MD or Carlyle Dolly, MD   Other Instructions Thank you for choosing [facility]!

## 2020-06-22 ENCOUNTER — Telehealth: Payer: Self-pay

## 2020-06-22 ENCOUNTER — Other Ambulatory Visit: Payer: Self-pay

## 2020-06-22 DIAGNOSIS — E782 Mixed hyperlipidemia: Secondary | ICD-10-CM

## 2020-06-22 MED ORDER — PRAVASTATIN SODIUM 20 MG PO TABS
20.0000 mg | ORAL_TABLET | Freq: Every evening | ORAL | 3 refills | Status: DC
Start: 2020-06-22 — End: 2020-10-04

## 2020-06-22 NOTE — Telephone Encounter (Signed)
-----   Message from Imogene Burn, PA-C sent at 06/22/2020  8:08 AM EDT ----- Reviewed labs from PCP. Kidney function up and similar to last year. LDL 83, goal is less than 70. Increase pravachol 20 mg once daily. Repeat FLP in 3 months. thanks

## 2020-06-22 NOTE — Telephone Encounter (Signed)
I spoke with patient. He agrees to increase pravastatin to 20 mg daily, e-scribed to [employer]. I will mail his lab slip.

## 2020-07-13 ENCOUNTER — Telehealth: Payer: Self-pay | Admitting: Cardiology

## 2020-07-13 DIAGNOSIS — N189 Chronic kidney disease, unspecified: Secondary | ICD-10-CM | POA: Diagnosis not present

## 2020-07-13 DIAGNOSIS — I87313 Chronic venous hypertension (idiopathic) with ulcer of bilateral lower extremity: Secondary | ICD-10-CM | POA: Diagnosis not present

## 2020-07-13 DIAGNOSIS — I130 Hypertensive heart and chronic kidney disease with heart failure and stage 1 through stage 4 chronic kidney disease, or unspecified chronic kidney disease: Secondary | ICD-10-CM | POA: Diagnosis not present

## 2020-07-13 DIAGNOSIS — I2510 Atherosclerotic heart disease of native coronary artery without angina pectoris: Secondary | ICD-10-CM | POA: Diagnosis not present

## 2020-07-13 DIAGNOSIS — R338 Other retention of urine: Secondary | ICD-10-CM | POA: Diagnosis not present

## 2020-07-13 DIAGNOSIS — I110 Hypertensive heart disease with heart failure: Secondary | ICD-10-CM | POA: Diagnosis not present

## 2020-07-13 DIAGNOSIS — R600 Localized edema: Secondary | ICD-10-CM | POA: Diagnosis not present

## 2020-07-13 DIAGNOSIS — M6281 Muscle weakness (generalized): Secondary | ICD-10-CM | POA: Diagnosis not present

## 2020-07-13 DIAGNOSIS — L97911 Non-pressure chronic ulcer of unspecified part of right lower leg limited to breakdown of skin: Secondary | ICD-10-CM | POA: Diagnosis not present

## 2020-07-13 DIAGNOSIS — I4891 Unspecified atrial fibrillation: Secondary | ICD-10-CM | POA: Diagnosis not present

## 2020-07-13 DIAGNOSIS — L97921 Non-pressure chronic ulcer of unspecified part of left lower leg limited to breakdown of skin: Secondary | ICD-10-CM | POA: Diagnosis not present

## 2020-07-13 DIAGNOSIS — I509 Heart failure, unspecified: Secondary | ICD-10-CM | POA: Diagnosis not present

## 2020-07-13 DIAGNOSIS — N401 Enlarged prostate with lower urinary tract symptoms: Secondary | ICD-10-CM | POA: Diagnosis not present

## 2020-07-13 DIAGNOSIS — K436 Other and unspecified ventral hernia with obstruction, without gangrene: Secondary | ICD-10-CM | POA: Diagnosis not present

## 2020-07-13 DIAGNOSIS — D631 Anemia in chronic kidney disease: Secondary | ICD-10-CM | POA: Diagnosis not present

## 2020-07-13 DIAGNOSIS — E039 Hypothyroidism, unspecified: Secondary | ICD-10-CM | POA: Diagnosis not present

## 2020-07-13 DIAGNOSIS — Z6841 Body Mass Index (BMI) 40.0 and over, adult: Secondary | ICD-10-CM | POA: Diagnosis not present

## 2020-07-13 NOTE — Telephone Encounter (Signed)
I think those changes are reasonable, would defer to Dr Juel Burrow office since they are currently managing but I think that is fine  Zandra Abts MD

## 2020-07-13 NOTE — Telephone Encounter (Signed)
I spoke with patient.He said Dr.Hall's PA stopped his amlodipine because of swelling and has increased his lasix from 40 mg qd to 80 mg qd for 3 days. They will then touch base with him on Monday 07/16/20 . He said his weight at office visit was 316 lbs. He is unable to weigh at home.     I will FYI Dr.Branch.

## 2020-07-13 NOTE — Telephone Encounter (Signed)
Dr Caryn Section stopped the amLODipine (NORVASC) 5 MG tablet said that it was causing him to have swelling . And increased his Lasik to 80mg  starting today for the next couple days , he has gained 30lbs in under 2 months

## 2020-07-13 NOTE — Telephone Encounter (Signed)
I spoke with patient and relayed Dr.Branch's message. He will f/u with Dr.Hall

## 2020-07-16 DIAGNOSIS — R600 Localized edema: Secondary | ICD-10-CM | POA: Diagnosis not present

## 2020-07-17 ENCOUNTER — Telehealth: Payer: Self-pay | Admitting: Cardiology

## 2020-07-17 MED ORDER — METOPROLOL TARTRATE 25 MG PO TABS
12.5000 mg | ORAL_TABLET | Freq: Two times a day (BID) | ORAL | 3 refills | Status: DC
Start: 2020-07-17 — End: 2020-08-28

## 2020-07-17 MED ORDER — METOPROLOL TARTRATE 25 MG PO TABS
25.0000 mg | ORAL_TABLET | Freq: Two times a day (BID) | ORAL | 3 refills | Status: DC
Start: 2020-07-17 — End: 2020-07-17

## 2020-07-17 NOTE — Telephone Encounter (Signed)
I spoke with HHN, Cassandra. She had her first visit with patient today. She states patient told her his weight is 330 lbs-she did not weigh him. Her concern is BP 150/56, HR 50. She questions lopressor 50 mg bid dose.   She has already called Dr.Hall's office and is waiting to hear from them.     I spoke with Erlene Quan at [facility] and he confirmed that patient was taking Lopressor 50 mg bid prescribed by a Dr.David Fisher, [place], last filled 05/28/20

## 2020-07-17 NOTE — Telephone Encounter (Signed)
Per secure message from Dr.Branch: defer to Dr [place] Crane to avoid creating confusion in multiple providers making changes at once. Lopressor was 12.5mg  bid at his last visit with [place], I don't know where 50mg  bid came from, are they sure about that dose    New rx for lopressor 12.5 mg bid sent to [facility]. Crystal, pharmacist , will have driver pick up pill pack from patients home today.They will deliver new pill pack tonight. Patient verbalized understanding of new lopressor dosing.    I spoke with Terressa Koyanagi, PA-C  [phone] with Dr.Hall's office. She will call patient to discuss leg wound and lasix dosing. She had just spoke with him yesterday regarding this and suggested he go to the ED for IV diuresis but patient declined.   Evelena Asa, [facility] made aware. She will see patient again on Thursday 6/30 and she plans to have the physical therapist weigh him when he visits this week. I gave her [facility] Senior phone number also.

## 2020-07-17 NOTE — Telephone Encounter (Signed)
Gardiner Fanti from [facility] called to see if the patient needs his meds changed. She went to see him this morning, his heart rate was 50. She said he has a sore on his leg from swelling that is draining from the swelling. She said he also has SOB when doing any activity. She said he has been taking the metoprolol tartrate (LOPRESSOR) 50 MG tablet. 25mg  twice a day. She would like a call back so she update her records with Appt(if needed) or Med change(if needed). She can be reached at [phone].

## 2020-08-12 DIAGNOSIS — M6281 Muscle weakness (generalized): Secondary | ICD-10-CM | POA: Diagnosis not present

## 2020-08-12 DIAGNOSIS — I87313 Chronic venous hypertension (idiopathic) with ulcer of bilateral lower extremity: Secondary | ICD-10-CM | POA: Diagnosis not present

## 2020-08-12 DIAGNOSIS — E039 Hypothyroidism, unspecified: Secondary | ICD-10-CM | POA: Diagnosis not present

## 2020-08-12 DIAGNOSIS — I4891 Unspecified atrial fibrillation: Secondary | ICD-10-CM | POA: Diagnosis not present

## 2020-08-12 DIAGNOSIS — I509 Heart failure, unspecified: Secondary | ICD-10-CM | POA: Diagnosis not present

## 2020-08-12 DIAGNOSIS — R338 Other retention of urine: Secondary | ICD-10-CM | POA: Diagnosis not present

## 2020-08-12 DIAGNOSIS — Z6841 Body Mass Index (BMI) 40.0 and over, adult: Secondary | ICD-10-CM | POA: Diagnosis not present

## 2020-08-12 DIAGNOSIS — N189 Chronic kidney disease, unspecified: Secondary | ICD-10-CM | POA: Diagnosis not present

## 2020-08-12 DIAGNOSIS — N401 Enlarged prostate with lower urinary tract symptoms: Secondary | ICD-10-CM | POA: Diagnosis not present

## 2020-08-12 DIAGNOSIS — I2510 Atherosclerotic heart disease of native coronary artery without angina pectoris: Secondary | ICD-10-CM | POA: Diagnosis not present

## 2020-08-12 DIAGNOSIS — I130 Hypertensive heart and chronic kidney disease with heart failure and stage 1 through stage 4 chronic kidney disease, or unspecified chronic kidney disease: Secondary | ICD-10-CM | POA: Diagnosis not present

## 2020-08-12 DIAGNOSIS — L97911 Non-pressure chronic ulcer of unspecified part of right lower leg limited to breakdown of skin: Secondary | ICD-10-CM | POA: Diagnosis not present

## 2020-08-12 DIAGNOSIS — L97921 Non-pressure chronic ulcer of unspecified part of left lower leg limited to breakdown of skin: Secondary | ICD-10-CM | POA: Diagnosis not present

## 2020-08-12 DIAGNOSIS — K436 Other and unspecified ventral hernia with obstruction, without gangrene: Secondary | ICD-10-CM | POA: Diagnosis not present

## 2020-08-12 DIAGNOSIS — D631 Anemia in chronic kidney disease: Secondary | ICD-10-CM | POA: Diagnosis not present

## 2020-08-17 ENCOUNTER — Encounter: Payer: Self-pay | Admitting: *Deleted

## 2020-08-17 ENCOUNTER — Encounter: Payer: Self-pay | Admitting: Cardiology

## 2020-08-17 ENCOUNTER — Other Ambulatory Visit: Payer: Self-pay

## 2020-08-17 ENCOUNTER — Ambulatory Visit: Payer: Medicare Other | Admitting: Cardiology

## 2020-08-17 VITALS — BP 150/76 | HR 68 | Ht 72.0 in | Wt 318.0 lb

## 2020-08-17 DIAGNOSIS — I2510 Atherosclerotic heart disease of native coronary artery without angina pectoris: Secondary | ICD-10-CM | POA: Diagnosis not present

## 2020-08-17 DIAGNOSIS — I10 Essential (primary) hypertension: Secondary | ICD-10-CM | POA: Diagnosis not present

## 2020-08-17 DIAGNOSIS — R600 Localized edema: Secondary | ICD-10-CM

## 2020-08-17 DIAGNOSIS — I480 Paroxysmal atrial fibrillation: Secondary | ICD-10-CM | POA: Diagnosis not present

## 2020-08-17 MED ORDER — HYDRALAZINE HCL 25 MG PO TABS: 25.0000 mg | ORAL_TABLET | Freq: Three times a day (TID) | ORAL | 3 refills | Status: DC

## 2020-08-17 NOTE — Patient Instructions (Signed)
Medication Instructions:   Stop Taking Norvasc  Start Taking Hydralazine 25 mg Three Times Daily   *If you need a refill on your cardiac medications before your next appointment, please call your pharmacy*   Lab Work: NONE   If you have labs (blood work) drawn today and your tests are completely normal, you will receive your results only by: [place] (if you have MyChart) OR A paper copy in the mail If you have any lab test that is abnormal or we need to change your treatment, we will call you to review the results.   Testing/Procedures: NONE    Follow-Up: At [facility], you and your health needs are our priority.  As part of our continuing mission to provide you with exceptional heart care, we have created designated Provider Care Teams.  These Care Teams include your primary Cardiologist (physician) and Advanced Practice Providers (APPs -  Physician Assistants and Nurse Practitioners) who all work together to provide you with the care you need, when you need it.  We recommend signing up for the patient portal called "MyChart".  Sign up information is provided on this After Visit Summary.  MyChart is used to connect with patients for Virtual Visits (Telemedicine).  Patients are able to view lab/test results, encounter notes, upcoming appointments, etc.  Non-urgent messages can be sent to your provider as well.   To learn more about what you can do with MyChart, go to [email or web address].    Your next appointment:   6 week(s)  The format for your next appointment:   In Person  Provider:   Carlyle Dolly, MD   Other Instructions Thank you for choosing [facility]!

## 2020-08-17 NOTE — Progress Notes (Signed)
Clinical Summary Derek Foster is a 85 y.o.male   1.CAD -history of PCI to LAD in 2011 - no recent symptoms   2. HTN - norvasc stopped due to swelling, though from prior notes swelling did not improve previously when off norvasc. He reports this time around however swelling worsened with restarting, he stopped taking.   - home bp's 150-160s   3. Hyperlipidemia - 06/2020 pravastatin increased to '20mg'$  daily as LDL was 83   4. PAF - from clinic notes occurred in setting of COVID 19 and hyperthyroidism - eliquis on hold after June 2021 hernia surgery, and shortly after issues with guiac + stools and anemia  - history of GI bleeding, eliquis stopped at that time. From notes patient declined further GI workup.He also reports prior issues with hematuria.  - no recent palpitations  5. LE edema - Jan 2021 echo LVEF 123456, indet diastolic function, severe LAE - pcp recently stopped norvasc again, increased lasix to '80mg'$  x 3 days  - he reports home health reporst swelling is improving.  - home weight 330 lbs, he is unsure of trend.     5. Off home hospice  6. CKD - last Cr 1.6 by pcp   Past Medical History:  Diagnosis Date   AKI (acute kidney injury) ([place]) 02/12/2019   CAD (coronary artery disease)    a. cath 01/27/2009 : s/p promus DES to LAD and medical managment of 70-80% mRCA   COVID-19    Essential hypertension    Hypothyroidism    Mixed hyperlipidemia    NSTEMI (non-ST elevated myocardial infarction) ([place]) 2011   PAF (paroxysmal atrial fibrillation) ([facility])      Allergies  Allergen Reactions   Indomethacin Anaphylaxis    But tolerates ibuprofen, aleve   Iodine-131 Anaphylaxis     Current Outpatient Medications  Medication Sig Dispense Refill   amiodarone (PACERONE) 200 MG tablet Take 1 tablet (200 mg total) by mouth daily. 30 tablet 0   amLODipine (NORVASC) 5 MG tablet Take 1 tablet (5 mg total) by mouth daily. 90 tablet 3   aspirin EC 81 MG tablet Take 81  mg by mouth daily. Swallow whole.     Coenzyme Q10 200 MG capsule Take 200 mg by mouth daily.     finasteride (PROSCAR) 5 MG tablet Take 1 tablet (5 mg total) by mouth daily. 30 tablet 0   furosemide (LASIX) 20 MG tablet Take 2 tablets (40 mg total) by mouth daily. 60 tablet 0   gabapentin (NEURONTIN) 100 MG capsule Take 100 mg by mouth daily.     methimazole (TAPAZOLE) 10 MG tablet Take 2 tablets (20 mg total) by mouth 2 (two) times daily. 120 tablet 0   metoprolol tartrate (LOPRESSOR) 25 MG tablet Take 0.5 tablets (12.5 mg total) by mouth 2 (two) times daily. 45 tablet 3   Multiple Vitamin (MULTIVITAMIN) tablet Take 1 tablet by mouth daily.     nitroGLYCERIN (NITROSTAT) 0.4 MG SL tablet Place 1 tablet (0.4 mg total) under the tongue every 5 (five) minutes as needed. 25 tablet 0   NON FORMULARY Diet - NAS     ofloxacin (OCUFLOX) 0.3 % ophthalmic solution Place 1 drop into both eyes 4 (four) times daily. 5 mL 0   potassium chloride (KLOR-CON) 10 MEQ tablet Take 1 tablet (10 mEq total) by mouth every evening. 30 tablet 0   pravastatin (PRAVACHOL) 20 MG tablet Take 1 tablet (20 mg total) by mouth every evening. 90 tablet  3   tamsulosin (FLOMAX) 0.4 MG CAPS capsule Take 1 capsule (0.4 mg total) by mouth daily after breakfast. 30 capsule 0   valACYclovir (VALTREX) 500 MG tablet Take 500 mg by mouth daily.     No current facility-administered medications for this visit.     Past Surgical History:  Procedure Laterality Date   BOWEL RESECTION N/A 06/28/2019   Procedure: SMALL BOWEL RESECTION;  Surgeon: Virl Cagey, MD;  Location: [facility];  Service: General;  Laterality: N/A;   CHOLECYSTECTOMY     CORONARY STENT PLACEMENT     CYSTOSCOPY/RETROGRADE/URETEROSCOPY Bilateral 03/09/2019   Procedure: CYSTOSCOPY, Clot Evacuation, Fulguration;  Surgeon: Festus Aloe, MD;  Location: [facility];  Service: Urology;  Laterality: Bilateral;   HERNIA REPAIR     LYSIS OF ADHESION N/A 06/28/2019   Procedure:  LYSIS OF ADHESION;  Surgeon: Virl Cagey, MD;  Location: [facility];  Service: General;  Laterality: N/A;   VENTRAL HERNIA REPAIR N/A 06/28/2019   Procedure: HERNIA REPAIR VENTRAL ADULT WITH VICRYL MESH OVERLAY;  Surgeon: Virl Cagey, MD;  Location: [facility];  Service: General;  Laterality: N/A;     Allergies  Allergen Reactions   Indomethacin Anaphylaxis    But tolerates ibuprofen, aleve   Iodine-131 Anaphylaxis      Family History  Problem Relation Age of Onset   Heart attack Father    Lung cancer Sister      Social History Derek Foster reports that he quit smoking about 63 years ago. He has never used smokeless tobacco. Derek Foster reports no history of alcohol use.   Review of Systems CONSTITUTIONAL: No weight loss, fever, chills, weakness or fatigue.  HEENT: Eyes: No visual loss, blurred vision, double vision or yellow sclerae.No hearing loss, sneezing, congestion, runny nose or sore throat.  SKIN: No rash or itching.  CARDIOVASCULAR: per hpi RESPIRATORY: No shortness of breath, cough or sputum.  GASTROINTESTINAL: No anorexia, nausea, vomiting or diarrhea. No abdominal pain or blood.  GENITOURINARY: No burning on urination, no polyuria NEUROLOGICAL: No headache, dizziness, syncope, paralysis, ataxia, numbness or tingling in the extremities. No change in bowel or bladder control.  MUSCULOSKELETAL: No muscle, back pain, joint pain or stiffness.  LYMPHATICS: No enlarged nodes. No history of splenectomy.  PSYCHIATRIC: No history of depression or anxiety.  ENDOCRINOLOGIC: No reports of sweating, cold or heat intolerance. No polyuria or polydipsia.  Marland Kitchen   Physical Examination Today's Vitals   08/17/20 1306  BP: (!) 150/76  Pulse: 68  SpO2: 97%  Weight: (!) 318 lb (144.2 kg)  Height: 6' (1.829 m)   Body mass index is 43.13 kg/m.  Gen: resting comfortably, no acute distress HEENT: no scleral icterus, pupils equal round and reactive, no palptable cervical  adenopathy,  CV: RRR, n m/r/g no jvd Resp: Clear to auscultation bilaterally GI: abdomen is soft, non-tender, non-distended, normal bowel sounds, no hepatosplenomegaly MSK: extremities are warm, 1+ bilateral LE edema Skin: warm, no rash Neuro:  no focal deficits Psych: appropriate affect     Assessment and Plan  CAD -no symptoms,, continue current meds  2. Afib - maintaining SR. Off anticoag as reported above, would not restart at this time  3. HTN - off norvasc due to recurrent edema - has renal dysfunction. Would start hydralazine '25mg'$  tid  4. LE edema - he reports improving, continue oral lasix '40mg'$  daily. Pending swelling and upcoming pcp labs could titrate further if needed. Prior echo with normal LVEF, indet diastolic function but severe  LAE would suggest abnormal diastolic function   F/u 6 weeks reassess fluid status, f/u pcp labs, f/u bp's      Derek Foster, M.D

## 2020-08-23 ENCOUNTER — Telehealth: Payer: Self-pay | Admitting: Cardiology

## 2020-08-23 NOTE — Telephone Encounter (Signed)
Spoke to pt who stated that he is feeling very fatigued and nervous. Pt stated that he gets SOB when walking, but gets better with rest. Pt denies CP. Pt stated that his bp today was 150/70. Pt denies having any other readings at the moment, but was advised to keep a daily log of his bp. Pt feels like this is happening since starting Hydralazine 25 mg tablets TID.  Please advise.

## 2020-08-23 NOTE — Telephone Encounter (Signed)
New message    Pt c/o medication issue:  1. Name of Medication: hydrALAZINE (APRESOLINE) 25 MG tablet  2. How are you currently taking this medication (dosage and times per day)?  3x a day  3. Are you having a reaction (difficulty breathing--STAT)? yes  4. What is your medication issue? Making him feel wiped out, feels like he has a hammer beating him in the head , feeling very nervous

## 2020-08-23 NOTE — Telephone Encounter (Signed)
Hold hydralazine and update [place] on symptoms on Monday   Zandra Abts MD

## 2020-08-23 NOTE — Telephone Encounter (Signed)
Pt verbalized understanding and voiced understanding to update office on Monday with symptoms and bp readings.

## 2020-08-27 ENCOUNTER — Telehealth: Payer: Self-pay | Admitting: Cardiology

## 2020-08-27 NOTE — Telephone Encounter (Signed)
Can we change his lopressor to coreg 3.'125mg'$  bid for better bp effects, update [place] on bp's and HRs on Thursday  J Lyndsie Wallman MD

## 2020-08-27 NOTE — Telephone Encounter (Signed)
   Arnoldo Lenis, MD     1:33 PM Note Hold hydralazine and update [place] on symptoms on Monday     Zandra Abts MD

## 2020-08-27 NOTE — Telephone Encounter (Signed)
New message     Patient states his bp is 160/70 but he is feeling a lot better since stopping the hydrALAZINE (APRESOLINE) 25 MG tablet

## 2020-08-28 MED ORDER — CARVEDILOL 3.125 MG PO TABS: 3.1250 mg | ORAL_TABLET | Freq: Two times a day (BID) | ORAL | 3 refills | Status: DC

## 2020-08-28 NOTE — Telephone Encounter (Signed)
Pt is returning call your call

## 2020-08-28 NOTE — Telephone Encounter (Signed)
Pt notified and voiced understanding of medication changes. Pt agreeable to update office of bp/hr on Thursday.

## 2020-08-31 ENCOUNTER — Telehealth: Payer: Self-pay | Admitting: Cardiology

## 2020-08-31 NOTE — Telephone Encounter (Signed)
Spoke to pt who stated he had not kept a log of his bp/hr. Pt voiced understanding of keeping log.  Pt confirmed he had changed lopressor to coreg 3.125 mg tablets bid.  HHN spoke to me and verbalized that pt has been under stress d/t family this past week and that could be what is contributing to higher bp readings as well.

## 2020-08-31 NOTE — Telephone Encounter (Signed)
Annette @ [facility] called to report Derek Foster has elevated BP 204/80 HR 64 last seen by [facility]. She changed  3days ago the BP meds. She can be reached at [phone].

## 2020-09-03 ENCOUNTER — Telehealth: Payer: Self-pay | Admitting: Cardiology

## 2020-09-03 MED ORDER — CARVEDILOL 6.25 MG PO TABS: 6.2500 mg | ORAL_TABLET | Freq: Two times a day (BID) | ORAL | 3 refills | Status: DC

## 2020-09-03 NOTE — Telephone Encounter (Signed)
New message   Call the patient not Derek Foster  Patient is having problem with catheter pain and blood in catheter as well.    Derek Foster went out to  do PT Friday and today and the patient BP was elevated so they could not do any physical therapy with him Friday it was 188/68 and today it was 192/68

## 2020-09-03 NOTE — Telephone Encounter (Signed)
Increase coreg to 6.'25mg'$  bid. Need HRs and bp's at next update on Friday please  Zandra Abts MD

## 2020-09-03 NOTE — Telephone Encounter (Signed)
Patient had foley catheter placed Saturday in his home by [facility]. Today he has blood in the bag. I told hi, to call the [facility] today and they can send someone to to his home today.    I will message Dr.Branch regarding BP readings. He stopped the hydralazine last week because of fatigue and nervousness.

## 2020-09-03 NOTE — Telephone Encounter (Signed)
Derek Foster agrees to increase coreg to 6.25 mg bid and call us back with HR/BP

## 2020-09-10 DIAGNOSIS — I10 Essential (primary) hypertension: Secondary | ICD-10-CM | POA: Diagnosis not present

## 2020-09-10 DIAGNOSIS — N400 Enlarged prostate without lower urinary tract symptoms: Secondary | ICD-10-CM | POA: Diagnosis not present

## 2020-09-11 DIAGNOSIS — R338 Other retention of urine: Secondary | ICD-10-CM | POA: Diagnosis not present

## 2020-09-11 DIAGNOSIS — Z9181 History of falling: Secondary | ICD-10-CM | POA: Diagnosis not present

## 2020-09-11 DIAGNOSIS — Z6841 Body Mass Index (BMI) 40.0 and over, adult: Secondary | ICD-10-CM | POA: Diagnosis not present

## 2020-09-11 DIAGNOSIS — I4891 Unspecified atrial fibrillation: Secondary | ICD-10-CM | POA: Diagnosis not present

## 2020-09-11 DIAGNOSIS — I2510 Atherosclerotic heart disease of native coronary artery without angina pectoris: Secondary | ICD-10-CM | POA: Diagnosis not present

## 2020-09-11 DIAGNOSIS — N401 Enlarged prostate with lower urinary tract symptoms: Secondary | ICD-10-CM | POA: Diagnosis not present

## 2020-09-11 DIAGNOSIS — N189 Chronic kidney disease, unspecified: Secondary | ICD-10-CM | POA: Diagnosis not present

## 2020-09-11 DIAGNOSIS — D631 Anemia in chronic kidney disease: Secondary | ICD-10-CM | POA: Diagnosis not present

## 2020-09-11 DIAGNOSIS — K436 Other and unspecified ventral hernia with obstruction, without gangrene: Secondary | ICD-10-CM | POA: Diagnosis not present

## 2020-09-11 DIAGNOSIS — I509 Heart failure, unspecified: Secondary | ICD-10-CM | POA: Diagnosis not present

## 2020-09-11 DIAGNOSIS — E039 Hypothyroidism, unspecified: Secondary | ICD-10-CM | POA: Diagnosis not present

## 2020-09-11 DIAGNOSIS — I130 Hypertensive heart and chronic kidney disease with heart failure and stage 1 through stage 4 chronic kidney disease, or unspecified chronic kidney disease: Secondary | ICD-10-CM | POA: Diagnosis not present

## 2020-09-11 DIAGNOSIS — R000 Tachycardia, unspecified: Secondary | ICD-10-CM | POA: Diagnosis not present

## 2020-09-11 NOTE — Telephone Encounter (Signed)
Pt is calling to give updated BP readings   Reading for today is 147/49- heart rate 60  Please call [phone]

## 2020-09-12 NOTE — Telephone Encounter (Signed)
Systolic bp little high but diastolic bp getting on the lower end, would not make further med changes at this time  J Crystalina Stodghill MD

## 2020-09-13 NOTE — Telephone Encounter (Signed)
Left message to return call 

## 2020-09-13 NOTE — Telephone Encounter (Signed)
Relayed Dr.Branch's message and he will not make any medication changes.

## 2020-09-17 ENCOUNTER — Telehealth: Payer: Self-pay

## 2020-09-17 NOTE — Telephone Encounter (Signed)
Keep a record of HR, bp, weight and date and any other symptoms in a log book and bring with you when you come back t clinic. GT

## 2020-09-17 NOTE — Telephone Encounter (Signed)
Pts HHN contacted office to let us know pt's bp has been running 148/62 (Sat.) & 170/68 (today). His hr is60 bpm today. Pt states that since starting Coreg 6.25 mg tablets, he can feel his heart beating "through his entire body". Pt also c/o SOB with exertion.  Pt has not kept record of his bp/weight. Please advise.

## 2020-09-21 DIAGNOSIS — I482 Chronic atrial fibrillation, unspecified: Secondary | ICD-10-CM | POA: Diagnosis not present

## 2020-09-21 DIAGNOSIS — I509 Heart failure, unspecified: Secondary | ICD-10-CM | POA: Diagnosis not present

## 2020-09-21 DIAGNOSIS — I10 Essential (primary) hypertension: Secondary | ICD-10-CM | POA: Diagnosis not present

## 2020-09-21 DIAGNOSIS — D649 Anemia, unspecified: Secondary | ICD-10-CM | POA: Diagnosis not present

## 2020-09-25 ENCOUNTER — Telehealth: Payer: Self-pay | Admitting: Cardiology

## 2020-09-25 ENCOUNTER — Other Ambulatory Visit: Payer: Self-pay

## 2020-09-25 NOTE — Telephone Encounter (Signed)
Spoke with pt who states that he is taking Coreg 6.25 mg two times daily and no longer taking Hydralazine. Pt states that the Hydralazine makes him feel "funny". Please advise.

## 2020-09-25 NOTE — Telephone Encounter (Signed)
New message    Pt c/o BP issue: STAT if pt c/o blurred vision, one-sided weakness or slurred speech  1. What are your last 5 BP readings 160/58  162/64 162/67 192/70 176/64   2. Are you having any other symptoms (ex. Dizziness, headache, blurred vision, passed out)? no  3. What is your BP issue?  Doesn't feel like the medication is controlling his BP

## 2020-09-27 ENCOUNTER — Encounter: Payer: Self-pay | Admitting: Physician Assistant

## 2020-09-27 MED ORDER — CARVEDILOL 6.25 MG PO TABS
9.3750 mg | ORAL_TABLET | Freq: Two times a day (BID) | ORAL | 3 refills | Status: DC
Start: 2020-09-27 — End: 2020-10-01

## 2020-09-27 NOTE — Telephone Encounter (Signed)
Pt notified and voiced understanding. Pt medication called in the pharmacy. Pt medication list updated to reflect medication changes.

## 2020-09-27 NOTE — Telephone Encounter (Signed)
Increaee coreg to 9.'375mg'$  bid and udpate [place] next week  Zandra Abts MD

## 2020-09-27 NOTE — Progress Notes (Addendum)
Cardiology Office Note    Date:  10/01/2020   ID:  Derek Foster, DOB April 04, 1935, MRN [id number]  PCP:  Celene Squibb, MD  Cardiologist:  Carlyle Dolly, MD  Electrophysiologist:  None   Chief Complaint: f/u HTN  History of Present Illness:   Derek Foster is a 85 y.o. male with history of CAD with MI 2011 s/p PCI to LAD, HTN, HLD (followed by primary care), PAF (in setting of Covid and hyperthyroidism), history of GI bleeding, hematuria, chronic LE edema (possible component of chronic diastolic CHF), CKD stage 3b, mild carotid plaque by [place] 2013, hyperthyroidism who presents for follow-up.   Prior notes reviewed. He has not required ischemic eval since original PCI to LAD in 2011. His cath at that time also showed 70-80% mRCA but do not see this was intervened upon. He has a history of PAF in context of Covid/hyperthyroidism. He has been treated with methimazole. He was on Eliquis but this was stopped due to issues with GI bleeding (pt declined further workup) and hematuria. He has been maintained on amiodarone. Last echo 01/2019 EF 60-65%, severe LAE, mildly elevated PA systolic pressure with indeterminate diastolic function. At one point he was on home hospice. He has been followed more recently for lower extremity edema requiring medication adjustment and Lasix. At last OV, his amlodipine was discontinued and he was started on hydralazine 64m TID. There have been several phone notes sense that time outlining that the patient held his hydralazine due to nervousness, then metoprolol was changed to carvedilol which was further titrated to 9.3753mBID.  He returns for follow-up today with his son. The majority of the visit was working to clarify his home med regimen against our records and the slip of paper he brought in from [facility]- He originally stated he was no longer taking amiodarone but we clarified he got this confused for amlodipine. - He confirms he is taking carvedilol  9.37573mID - he receives 6.30m37mD in his pill packs then says they sent him another bottle for him to add a half tablet twice a day to his regimen. - He confirms he is currently taking hydralazine 30mg43m and began taking it back last week. - He is no longer on simvastatin, ofloxacin, or valacyclovir. His dose of methimazole is listed as 10mg 74mon his pill pack list that he brought in. (Did not bring in actual meds).  In general he is doing OK but still just feels very tired. He just hasn't felt well ever since he had Covid. No CP or new dyspnea. Edema remains controlled on Lasix. BP remains elevated, similar to prior values. He is seeing similar readings at home. He tries to avoid salt/sodium. Per his son, he does snore. He is not interested in evaluation for sleep apnea.  Labwork independently reviewed: 08/2020 scanned labs LDL 82, Trig 72, HDL 43, K 4.4, Cr 1.59, Alk phos up 129 otherwise AST ALT OK, Hgb 12.4 2021 TSH suppressed  Past Medical History:  Diagnosis Date   AKI (acute kidney injury) (HCC) 0[place]/20[place]   CAD (coronary artery disease)    a. cath 01/27/2009 : s/p promus DES to LAD and medical managment of 70-80% mRCA   Chronic kidney disease, stage 3b (HCC)  [place]    Essential hypertension    GI bleeding    Hematuria    Hyperthyroidism    Hypothyroidism    Mild atherosclerosis of carotid artery  Mild pulmonary hypertension (HCC)    Mixed hyperlipidemia    NSTEMI (non-ST elevated myocardial infarction) ([place]) 2011   PAF (paroxysmal atrial fibrillation) ([place])     Past Surgical History:  Procedure Laterality Date   BOWEL RESECTION N/A 06/28/2019   Procedure: SMALL BOWEL RESECTION;  Surgeon: Virl Cagey, MD;  Location: [facility];  Service: General;  Laterality: N/A;   CHOLECYSTECTOMY     CORONARY STENT PLACEMENT     CYSTOSCOPY/RETROGRADE/URETEROSCOPY Bilateral 03/09/2019   Procedure: CYSTOSCOPY, Clot Evacuation, Fulguration;  Surgeon: Festus Aloe, MD;   Location: [facility];  Service: Urology;  Laterality: Bilateral;   HERNIA REPAIR     LYSIS OF ADHESION N/A 06/28/2019   Procedure: LYSIS OF ADHESION;  Surgeon: Virl Cagey, MD;  Location: [facility];  Service: General;  Laterality: N/A;   VENTRAL HERNIA REPAIR N/A 06/28/2019   Procedure: HERNIA REPAIR VENTRAL ADULT WITH VICRYL MESH OVERLAY;  Surgeon: Virl Cagey, MD;  Location: [facility];  Service: General;  Laterality: N/A;    Current Medications: Current Meds  Medication Sig   aspirin EC 81 MG tablet Take 81 mg by mouth daily. Swallow whole.   carvedilol (COREG) 6.25 MG tablet Take 1.5 tablets (9.375 mg total) by mouth 2 (two) times daily.   Coenzyme Q10 200 MG capsule Take 200 mg by mouth daily.   diclofenac Sodium (VOLTAREN) 1 % GEL as needed.   finasteride (PROSCAR) 5 MG tablet Take 1 tablet (5 mg total) by mouth daily.   furosemide (LASIX) 20 MG tablet Take 2 tablets (40 mg total) by mouth daily.   gabapentin (NEURONTIN) 100 MG capsule Take 100 mg by mouth daily.   hydrALAZINE (APRESOLINE) 25 MG tablet Take 1 tablet (25 mg total) by mouth 3 (three) times daily.   methimazole (TAPAZOLE) 10 MG tablet Take 2 tablets (20 mg total) by mouth 2 (two) times daily.   Multiple Vitamin (MULTIVITAMIN) tablet Take 1 tablet by mouth daily.   nitroGLYCERIN (NITROSTAT) 0.4 MG SL tablet Place 1 tablet (0.4 mg total) under the tongue every 5 (five) minutes as needed.   NON FORMULARY Diet - NAS   potassium chloride (KLOR-CON) 10 MEQ tablet Take 1 tablet (10 mEq total) by mouth every evening.   tamsulosin (FLOMAX) 0.4 MG CAPS capsule Take 1 capsule (0.4 mg total) by mouth daily after breakfast.   valACYclovir (VALTREX) 500 MG tablet Take 500 mg by mouth daily.     Allergies:   Indomethacin, Iodine-131, and Lidocaine-menthol   Social History   Socioeconomic History   Marital status: Married    Spouse name: Not on file   Number of children: Not on file   Years of education: Not on file   Highest  education level: Not on file  Occupational History   Occupation: textiles  Tobacco Use   Smoking status: Former    Types: Cigarettes    Quit date: 01/20/1957    Years since quitting: 63.7   Smokeless tobacco: Never  Vaping Use   Vaping Use: Never used  Substance and Sexual Activity   Alcohol use: No   Drug use: Never   Sexual activity: Not on file  Other Topics Concern   Not on file  Social History Narrative   Quit smoking about 62 years ago.   Social Determinants of Health   Financial Resource Strain: Not on file  Food Insecurity: Not on file  Transportation Needs: Not on file  Physical Activity: Not on file  Stress: Not on file  Social Connections: Not on file     Family History:  The patient's family history includes Heart attack in his father; Lung cancer in his sister.  ROS:   Please see the history of present illness.  All other systems are reviewed and otherwise negative.    EKGs/Labs/Other Studies Reviewed:    Studies reviewed are outlined and summarized above. Reports included below if pertinent.  Echo 01/2019  1. Left ventricular ejection fraction, by visual estimation, is 60 to  65%. The left ventricle has normal function. There is moderately increased  left ventricular hypertrophy.   2. Definity contrast agent was given IV to delineate the left ventricular  endocardial borders.   3. Left ventricular diastolic parameters are indeterminate.   4. The left ventricle has no regional wall motion abnormalities.   5. Global right ventricle has normal systolic function.The right  ventricular size is normal. Right vetricular wall thickness was not  assessed.   6. Left atrial size was severely dilated.   7. Right atrial size was normal.   8. The mitral valve is grossly normal. No evidence of mitral valve  regurgitation.   9. The tricuspid valve is grossly normal.  10. The tricuspid valve is grossly normal. Tricuspid valve regurgitation  is mild.  11. The  aortic valve is tricuspid. Aortic valve regurgitation is not  visualized. No evidence of aortic valve sclerosis or stenosis.  12. The pulmonic valve was grossly normal. Pulmonic valve regurgitation is  not visualized.  13. Mildly elevated pulmonary artery systolic pressure.  14. The interatrial septum was not well visualized.     EKG:  EKG is not ordered today  Recent Labs: No results found for requested labs within last 8760 hours.  Recent Lipid Panel    Component Value Date/Time   CHOL 138 05/28/2011 1030   TRIG 81 05/28/2011 1030   HDL 43 05/28/2011 1030   CHOLHDL 3.2 05/28/2011 1030   VLDL 16 05/28/2011 1030   LDLCALC 79 05/28/2011 1030    PHYSICAL EXAM:    VS:  BP (!) 150/65   Pulse (!) 56   Ht 6' (1.829 m)   Wt (!) 321 lb (145.6 kg)   SpO2 97%   BMI 43.54 kg/m   BMI: Body mass index is 43.54 kg/m.  GEN: Well nourished, well developed obese elderly male in no acute distress HEENT: normocephalic, atraumatic Neck: no JVD, carotid bruits, or masses Cardiac: RRR; no murmurs, rubs, or gallops, trace ankle edema (baseline leg habitus is larger but no pitting edema) Respiratory:  clear to auscultation bilaterally, normal work of breathing GI: soft, nontender, nondistended, + BS MS: no deformity or atrophy Skin: warm and dry, no rash Neuro:  Alert and Oriented x 3, Strength and sensation are intact, follows commands Psych: euthymic mood, full affect  Wt Readings from Last 3 Encounters:  10/01/20 (!) 321 lb (145.6 kg)  08/17/20 (!) 318 lb (144.2 kg)  05/28/20 (!) 316 lb (143.3 kg)     ASSESSMENT & PLAN:   1. Essential HTN - BP remains suboptimally controlled at this time. He continues to feel generally tired. He indicates he's generally not felt well ever since he had Covid so it's not clear this is specifically a medication side effect. Unfortunately with his CKD, baseline sinus bradycardia, edema, and existing regimen, there are not many options for additional  control. We cannot titrate carvedilol beyond current dose of 9.368m BID due to HR in the 50s. Amlodipine previously stopped due to edema. HCTZ,  chlorthalidone, spironolactone, etc not great choices given his CKD and already requires diuretic for edema. Clonidine would likely cause more fatigue and bradycardia. I would suggest we try to increase his hydralazine to 67m TID if he can tolerate this. Our nurse will call BGriffithto determine the best way to go about this - he may need to do what he is doing with the carvedilol, which is receiving an additional bottle to supplement his pill packs to equal the intended dose until the new ones come in. [facility] regimen at home to ensure what he is taking matches what he is being prescribed. If BP is still elevated at f/u and he is tolerating the increased dose, would further increase hydralazine to 1055mTID. I also encouraged him to continue to follow-up with primary care since fatigue can be the manifestation of many other issues as well. He does report PSA has been rising as well. We'll get a repeat CBC, CMET, and thyroid function for completeness today.  2. Lower extremity edema / possible chronic diastolic CHF, also mild pulmonary HTN - edema generally controlled on Lasix. Weight is up but lungs are clear and otherwise appears euvolemic. Discussed adhering to low sodium diet. Recheck labs today as above. He declines eval for OSA. Given his HTN, body habitus, snoring and edema, I would have high suspicion that he has this and may be contributing to fatigue.   3. CAD with HLD goal LDL <70 - no angina. Continue low dose ASA as tolerated. Continue beta blocker prescribed. LDL was slightly above goal at 82, checked by primary care. However, given his general fatigue and ongoing medication changes as above, I would continue current pravastatin dose to avoid confusing the picture.  4. Paroxysmal atrial  fibrillation - maintaining SB on exam. Recent alk phos was abnormal, so will recheck CMET along with thyroid to trend given amiodarone use. I wonder if perhaps at this point we can trial taking him off amiodarone since he has been maintaining NSR. He also notably has thyroid disease. I will reach out to Dr. BrHarl Bowieor his input. He is not on anticoagulation due to the above.  Disposition: F/u with nurse visit BP check in 1-2 weeks and Dr. BrHarl Bowien 6 weeks.   Medication Adjustments/Labs and Tests Ordered: Current medicines are reviewed at length with the patient today.  Concerns regarding medicines are outlined above. Medication changes, Labs and Tests ordered today are summarized above and listed in the Patient Instructions accessible in Encounters.    Signed, DaCharlie PitterPA-C  10/01/2020 1:45 PM    [place] [id number]: [phone] [phone]

## 2020-10-01 ENCOUNTER — Other Ambulatory Visit (HOSPITAL_COMMUNITY)
Admission: RE | Admit: 2020-10-01 | Discharge: 2020-10-01 | Disposition: A | Discharge status: Home / Self Care | Payer: Medicare Other | Source: Ambulatory Visit | Attending: Physician Assistant | Admitting: Physician Assistant

## 2020-10-01 ENCOUNTER — Encounter: Payer: Self-pay | Admitting: Physician Assistant

## 2020-10-01 ENCOUNTER — Other Ambulatory Visit: Payer: Self-pay

## 2020-10-01 ENCOUNTER — Ambulatory Visit: Payer: Medicare Other | Admitting: Physician Assistant

## 2020-10-01 VITALS — BP 150/65 | HR 56 | Ht 72.0 in | Wt 321.0 lb

## 2020-10-01 DIAGNOSIS — R600 Localized edema: Secondary | ICD-10-CM

## 2020-10-01 DIAGNOSIS — E785 Hyperlipidemia, unspecified: Secondary | ICD-10-CM | POA: Diagnosis not present

## 2020-10-01 DIAGNOSIS — I272 Pulmonary hypertension, unspecified: Secondary | ICD-10-CM

## 2020-10-01 DIAGNOSIS — I10 Essential (primary) hypertension: Secondary | ICD-10-CM | POA: Insufficient documentation

## 2020-10-01 DIAGNOSIS — I480 Paroxysmal atrial fibrillation: Secondary | ICD-10-CM | POA: Insufficient documentation

## 2020-10-01 DIAGNOSIS — I5032 Chronic diastolic (congestive) heart failure: Secondary | ICD-10-CM | POA: Insufficient documentation

## 2020-10-01 DIAGNOSIS — I2510 Atherosclerotic heart disease of native coronary artery without angina pectoris: Secondary | ICD-10-CM | POA: Insufficient documentation

## 2020-10-01 LAB — COMPREHENSIVE METABOLIC PANEL
ALT: 15 U/L (ref 0–44)
AST: 16 U/L (ref 15–41)
Albumin: 3.7 g/dL (ref 3.5–5.0)
Alkaline Phosphatase: 96 U/L (ref 38–126)
Anion gap: 7 (ref 5–15)
BUN: 25 mg/dL — ABNORMAL HIGH (ref 8–23)
CO2: 27 mmol/L (ref 22–32)
Calcium: 8.6 mg/dL — ABNORMAL LOW (ref 8.9–10.3)
Chloride: 107 mmol/L (ref 98–111)
Creatinine, Ser: 1.56 mg/dL — ABNORMAL HIGH (ref 0.61–1.24)
GFR, Estimated: 44 mL/min — ABNORMAL LOW (ref >60)
Glucose, Bld: 104 mg/dL — ABNORMAL HIGH (ref 70–99)
Potassium: 4.2 mmol/L (ref 3.5–5.1)
Sodium: 141 mmol/L (ref 135–145)
Total Bilirubin: 0.6 mg/dL (ref 0.3–1.2)
Total Protein: 6.8 g/dL (ref 6.5–8.1)

## 2020-10-01 LAB — CBC
HCT: 37.6 % — ABNORMAL LOW (ref 39.0–52.0)
Hemoglobin: 12 g/dL — ABNORMAL LOW (ref 13.0–17.0)
MCH: 30.8 pg (ref 26.0–34.0)
MCHC: 31.9 g/dL (ref 30.0–36.0)
MCV: 96.4 fL (ref 80.0–100.0)
Platelets: 200 10*3/uL (ref 150–400)
RBC: 3.9 MIL/uL — ABNORMAL LOW (ref 4.22–5.81)
RDW: 14 % (ref 11.5–15.5)
WBC: 8.6 10*3/uL (ref 4.0–10.5)
nRBC: 0 % (ref 0.0–0.2)

## 2020-10-01 LAB — TSH: TSH: 7.084 u[IU]/mL — ABNORMAL HIGH (ref 0.350–4.500)

## 2020-10-01 LAB — T4, FREE: Free T4: 0.8 ng/dL (ref 0.61–1.12)

## 2020-10-01 MED ORDER — CARVEDILOL 6.25 MG PO TABS: 9.3750 mg | ORAL_TABLET | Freq: Two times a day (BID) | ORAL | 3 refills | Status: DC

## 2020-10-01 MED ORDER — HYDRALAZINE HCL 50 MG PO TABS: 50.0000 mg | ORAL_TABLET | Freq: Three times a day (TID) | ORAL | 3 refills | Status: DC

## 2020-10-01 NOTE — Patient Instructions (Signed)
Medication Instructions:   Coreg Take 9.375 mg ( 1 1/2 Tablets ) Two Times Daily  Increase Hydralazine to 50 mg Three Times Daily   *If you need a refill on your cardiac medications before your next appointment, please call your pharmacy*   Lab Work: Your physician recommends that you return for lab work in: Today   If you have labs (blood work) drawn today and your tests are completely normal, you will receive your results only by: [place] (if you have MyChart) OR A paper copy in the mail If you have any lab test that is abnormal or we need to change your treatment, we will call you to review the results.   Testing/Procedures: NONE    Follow-Up: At [facility], you and your health needs are our priority.  As part of our continuing mission to provide you with exceptional heart care, we have created designated Provider Care Teams.  These Care Teams include your primary Cardiologist (physician) and Advanced Practice Providers (APPs -  Physician Assistants and Nurse Practitioners) who all work together to provide you with the care you need, when you need it.  We recommend signing up for the patient portal called "MyChart".  Sign up information is provided on this After Visit Summary.  MyChart is used to connect with patients for Virtual Visits (Telemedicine).  Patients are able to view lab/test results, encounter notes, upcoming appointments, etc.  Non-urgent messages can be sent to your provider as well.   To learn more about what you can do with MyChart, go to [email or web address].    Your next appointment:   6 week(s)  The format for your next appointment:   In Person  Provider:   Carlyle Dolly, MD   Other Instructions Thank you for choosing [facility]!

## 2020-10-02 ENCOUNTER — Other Ambulatory Visit: Payer: Self-pay

## 2020-10-02 DIAGNOSIS — I5032 Chronic diastolic (congestive) heart failure: Secondary | ICD-10-CM

## 2020-10-03 ENCOUNTER — Other Ambulatory Visit: Payer: Self-pay

## 2020-10-03 ENCOUNTER — Emergency Department (HOSPITAL_COMMUNITY)
Admission: EM | Admit: 2020-10-03 | Discharge: 2020-10-03 | Disposition: A | Discharge status: Home / Self Care | Payer: Medicare Other | Attending: Emergency Medicine | Admitting: Emergency Medicine

## 2020-10-03 ENCOUNTER — Encounter (HOSPITAL_COMMUNITY): Payer: Self-pay

## 2020-10-03 DIAGNOSIS — T83091A Other mechanical complication of indwelling urethral catheter, initial encounter: Secondary | ICD-10-CM | POA: Diagnosis not present

## 2020-10-03 DIAGNOSIS — Z79899 Other long term (current) drug therapy: Secondary | ICD-10-CM | POA: Insufficient documentation

## 2020-10-03 DIAGNOSIS — N1832 Chronic kidney disease, stage 3b: Secondary | ICD-10-CM | POA: Insufficient documentation

## 2020-10-03 DIAGNOSIS — Y846 Urinary catheterization as the cause of abnormal reaction of the patient, or of later complication, without mention of misadventure at the time of the procedure: Secondary | ICD-10-CM | POA: Diagnosis not present

## 2020-10-03 DIAGNOSIS — T83098A Other mechanical complication of other indwelling urethral catheter, initial encounter: Secondary | ICD-10-CM | POA: Insufficient documentation

## 2020-10-03 DIAGNOSIS — Z87891 Personal history of nicotine dependence: Secondary | ICD-10-CM | POA: Diagnosis not present

## 2020-10-03 DIAGNOSIS — R109 Unspecified abdominal pain: Secondary | ICD-10-CM | POA: Insufficient documentation

## 2020-10-03 DIAGNOSIS — I129 Hypertensive chronic kidney disease with stage 1 through stage 4 chronic kidney disease, or unspecified chronic kidney disease: Secondary | ICD-10-CM | POA: Diagnosis not present

## 2020-10-03 DIAGNOSIS — I2510 Atherosclerotic heart disease of native coronary artery without angina pectoris: Secondary | ICD-10-CM | POA: Diagnosis not present

## 2020-10-03 DIAGNOSIS — Z8616 Personal history of COVID-19: Secondary | ICD-10-CM | POA: Insufficient documentation

## 2020-10-03 DIAGNOSIS — R52 Pain, unspecified: Secondary | ICD-10-CM | POA: Diagnosis not present

## 2020-10-03 DIAGNOSIS — Z7982 Long term (current) use of aspirin: Secondary | ICD-10-CM | POA: Insufficient documentation

## 2020-10-03 DIAGNOSIS — R531 Weakness: Secondary | ICD-10-CM | POA: Diagnosis not present

## 2020-10-03 DIAGNOSIS — E039 Hypothyroidism, unspecified: Secondary | ICD-10-CM | POA: Insufficient documentation

## 2020-10-03 MED ORDER — ACETAMINOPHEN 325 MG PO TABS: 650.0000 mg | ORAL_TABLET | Freq: Once | ORAL | Status: DC

## 2020-10-03 NOTE — ED Notes (Signed)
Catheter flushed with ns.  Mucus clot removed.  Foley cath draining in to bag.

## 2020-10-03 NOTE — Discharge Instructions (Addendum)
Call your primary care doctor or specialist as discussed in the next 2-3 days.   Return immediately back to the ER if:  Your symptoms worsen within the next 12-24 hours. You develop new symptoms such as new fevers, persistent vomiting, new pain, shortness of breath, or new weakness or numbness, or if you have any other concerns.  

## 2020-10-03 NOTE — ED Triage Notes (Signed)
Pt to er, pt states that he has had a catheter for the past year and a half, states that it was last changed on the 8th, states that he is here for catheter/groin pain.  Pt states that it is still draining, denies bleeding.

## 2020-10-03 NOTE — ED Provider Notes (Signed)
[facility] EMERGENCY DEPARTMENT Provider Note   CSN: [id number] Arrival date & time: 10/03/20  1143     History Chief Complaint  Patient presents with   Groin Pain    Derek Foster is a 85 y.o. male.  Patient presents with complaint of suprapubic pain that started this morning.  Describes as sharp and aching.  He states he is not sure if his catheter is working correctly or not.  Denies any discharge from around the catheter.      Past Medical History:  Diagnosis Date   AKI (acute kidney injury) ([place]) 02/12/2019   CAD (coronary artery disease)    a. cath 01/27/2009 : s/p promus DES to LAD and medical managment of 70-80% mRCA   Chronic kidney disease, stage 3b ([place])    COVID-19    Essential hypertension    GI bleeding    Hematuria    Hyperthyroidism    Hypothyroidism    Mild atherosclerosis of carotid artery    Mild pulmonary hypertension (HCC)    Mixed hyperlipidemia    NSTEMI (non-ST elevated myocardial infarction) ([place]) 2011   PAF (paroxysmal atrial fibrillation) ([place])     Patient Active Problem List   Diagnosis Date Noted   Acute blood loss anemia 07/16/2019   Blood in stool 07/15/2019   Bilateral lower extremity edema 07/10/2019   Hypokalemia 07/10/2019   Aspiration pneumonia ([place]) 07/06/2019   Ventral hernia with bowel obstruction    Small bowel obstruction ([place]) 06/25/2019   [place] UTI (urinary tract infection) 06/25/2019   UTI (urinary tract infection) due to urinary indwelling catheter ([place]) 04/11/2019   Pleural effusion 04/11/2019   Radiculopathy 04/11/2019   PAF (paroxysmal atrial fibrillation) ([place]) 04/11/2019   Anticoagulated 04/07/2019   BPH (benign prostatic hyperplasia) 03/28/2019   GERD without esophagitis 03/28/2019   Protein-calorie malnutrition, severe ([place]) 03/28/2019   Palliative care by specialist    DNR (do not resuscitate)    Weakness generalized    Grief    Gross hematuria    AKI (acute kidney injury) ([place]) 02/12/2019   SBO  (small bowel obstruction) ([place]) 02/12/2019   CAD S/P percutaneous coronary angioplasty 02/11/2019   Atrial fibrillation, chronic ([place]) 02/11/2019   Mixed hyperlipidemia 09/10/2009   EDEMA 02/27/2009   Hyperthyroidism 02/26/2009   Essential hypertension 02/26/2009   MYOCARDIAL INFARCTION 02/26/2009    Past Surgical History:  Procedure Laterality Date   BOWEL RESECTION N/A 06/28/2019   Procedure: SMALL BOWEL RESECTION;  Surgeon: Virl Cagey, MD;  Location: [facility];  Service: General;  Laterality: N/A;   CHOLECYSTECTOMY     CORONARY STENT PLACEMENT     CYSTOSCOPY/RETROGRADE/URETEROSCOPY Bilateral 03/09/2019   Procedure: CYSTOSCOPY, Clot Evacuation, Fulguration;  Surgeon: Festus Aloe, MD;  Location: [facility];  Service: Urology;  Laterality: Bilateral;   HERNIA REPAIR     LYSIS OF ADHESION N/A 06/28/2019   Procedure: LYSIS OF ADHESION;  Surgeon: Virl Cagey, MD;  Location: [facility];  Service: General;  Laterality: N/A;   VENTRAL HERNIA REPAIR N/A 06/28/2019   Procedure: HERNIA REPAIR VENTRAL ADULT WITH VICRYL MESH OVERLAY;  Surgeon: Virl Cagey, MD;  Location: [facility];  Service: General;  Laterality: N/A;       Family History  Problem Relation Age of Onset   Heart attack Father    Lung cancer Sister     Social History   Tobacco Use   Smoking status: Former    Types: Cigarettes    Quit date: 01/20/1957  Years since quitting: 63.7   Smokeless tobacco: Never  Vaping Use   Vaping Use: Never used  Substance Use Topics   Alcohol use: No   Drug use: Never    Home Medications Prior to Admission medications   Medication Sig Start Date End Date Taking? Authorizing Provider  aspirin EC 81 MG tablet Take 81 mg by mouth daily. Swallow whole.    [provider]  carvedilol (COREG) 6.25 MG tablet Take 1.5 tablets (9.375 mg total) by mouth 2 (two) times daily. 10/01/20   Dunn, Nedra Hai, PA-C  Coenzyme Q10 200 MG capsule Take 200 mg by mouth daily.    [provider]  diclofenac Sodium (VOLTAREN) 1 % GEL as needed.    [provider]  finasteride (PROSCAR) 5 MG tablet Take 1 tablet (5 mg total) by mouth daily. 07/19/19   Gerlene Fee, NP  furosemide (LASIX) 20 MG tablet Take 2 tablets (40 mg total) by mouth daily. 07/19/19   Gerlene Fee, NP  gabapentin (NEURONTIN) 100 MG capsule Take 100 mg by mouth daily. 05/16/20   [provider]  hydrALAZINE (APRESOLINE) 50 MG tablet Take 1 tablet (50 mg total) by mouth 3 (three) times daily. 10/01/20 12/30/20  Dunn, Nedra Hai, PA-C  methimazole (TAPAZOLE) 10 MG tablet Take 2 tablets (20 mg total) by mouth 2 (two) times daily. 07/19/19   Gerlene Fee, NP  Multiple Vitamin (MULTIVITAMIN) tablet Take 1 tablet by mouth daily.    [provider]  nitroGLYCERIN (NITROSTAT) 0.4 MG SL tablet Place 1 tablet (0.4 mg total) under the tongue every 5 (five) minutes as needed. 07/19/19   Gerlene Fee, NP  NON FORMULARY Diet - NAS 07/04/19   [provider]  potassium chloride (KLOR-CON) 10 MEQ tablet Take 1 tablet (10 mEq total) by mouth every evening. 07/19/19   Gerlene Fee, NP  pravastatin (PRAVACHOL) 20 MG tablet Take 1 tablet (20 mg total) by mouth every evening. 10/04/20 01/02/21  Imogene Burn, PA-C  tamsulosin (FLOMAX) 0.4 MG CAPS capsule Take 1 capsule (0.4 mg total) by mouth daily after breakfast. 07/19/19   Gerlene Fee, NP    Allergies    Indomethacin, Iodine-131, and Lidocaine-menthol  Review of Systems   Review of Systems  Physical Exam Updated Vital Signs BP (!) 162/72   Pulse 72   Temp 98 F (36.7 C)   Resp 20   Ht 6' (1.829 m)   Wt (!) 144.2 kg   SpO2 98%   BMI 43.13 kg/m   Physical Exam  ED Results / Procedures / Treatments   Labs (all labs ordered are listed, but only abnormal results are displayed) Labs Reviewed - No data to display  EKG None  Radiology No results found.  Procedures Procedures   Medications Ordered in  ED Medications - No data to display   ED Course  I have reviewed the triage vital signs and the nursing notes.  Pertinent labs & imaging results that were available during my care of the patient were reviewed by me and considered in my medical decision making (see chart for details).    MDM Rules/Calculators/A&P                           After Foley catheter was flushed patient had resolution of his pain.  Urine appears to be flowing well from the catheter appears clear no blood or blood clots noted.  There was a mucus clot however that appears to have been the cause of his symptoms which had been flushed out.   Final Clinical Impression(s) / ED Diagnoses Final diagnoses:  Obstruction of Foley catheter, initial encounter [facility])    Rx / [place] Orders ED Discharge Orders     None        Luna Fuse, MD 10/06/20 1554

## 2020-10-03 NOTE — ED Provider Notes (Signed)
[facility] EMERGENCY DEPARTMENT Provider Note   CSN: [id number] Arrival date & time: 10/03/20  1143     History Chief Complaint  Patient presents with   Groin Pain    Derek Foster is a 85 y.o. male.  Patient presents ER chief complaint of suprapubic, groin pain.  He states has been ongoing since this morning.  Describes a sharp and aching nonradiating.  No associated fevers or cough or vomiting or diarrhea.  Has had a Foley catheter placed for over a year now.  This last one was replaced about 2 weeks ago per patient.  Denies any bleeding or flank pain.      Past Medical History:  Diagnosis Date   AKI (acute kidney injury) ([place]) 02/12/2019   CAD (coronary artery disease)    a. cath 01/27/2009 : s/p promus DES to LAD and medical managment of 70-80% mRCA   Chronic kidney disease, stage 3b ([place])    COVID-19    Essential hypertension    GI bleeding    Hematuria    Hyperthyroidism    Hypothyroidism    Mild atherosclerosis of carotid artery    Mild pulmonary hypertension (HCC)    Mixed hyperlipidemia    NSTEMI (non-ST elevated myocardial infarction) ([place]) 2011   PAF (paroxysmal atrial fibrillation) ([place])     Patient Active Problem List   Diagnosis Date Noted   Acute blood loss anemia 07/16/2019   Blood in stool 07/15/2019   Bilateral lower extremity edema 07/10/2019   Hypokalemia 07/10/2019   Aspiration pneumonia ([place]) 07/06/2019   Ventral hernia with bowel obstruction    Small bowel obstruction ([place]) 06/25/2019   Candidal UTI (urinary tract infection) 06/25/2019   UTI (urinary tract infection) due to urinary indwelling catheter ([place]) 04/11/2019   Pleural effusion 04/11/2019   Radiculopathy 04/11/2019   PAF (paroxysmal atrial fibrillation) ([place]) 04/11/2019   Anticoagulated 04/07/2019   BPH (benign prostatic hyperplasia) 03/28/2019   GERD without esophagitis 03/28/2019   Protein-calorie malnutrition, severe ([place]) 03/28/2019   Palliative care by specialist    DNR  (do not resuscitate)    Weakness generalized    Grief    Gross hematuria    AKI (acute kidney injury) ([place]) 02/12/2019   SBO (small bowel obstruction) ([place]) 02/12/2019   CAD S/P percutaneous coronary angioplasty 02/11/2019   Atrial fibrillation, chronic ([place]) 02/11/2019   Mixed hyperlipidemia 09/10/2009   EDEMA 02/27/2009   Hyperthyroidism 02/26/2009   Essential hypertension 02/26/2009   MYOCARDIAL INFARCTION 02/26/2009    Past Surgical History:  Procedure Laterality Date   BOWEL RESECTION N/A 06/28/2019   Procedure: SMALL BOWEL RESECTION;  Surgeon: Virl Cagey, MD;  Location: [facility];  Service: General;  Laterality: N/A;   CHOLECYSTECTOMY     CORONARY STENT PLACEMENT     CYSTOSCOPY/RETROGRADE/URETEROSCOPY Bilateral 03/09/2019   Procedure: CYSTOSCOPY, Clot Evacuation, Fulguration;  Surgeon: Festus Aloe, MD;  Location: [place];  Service: Urology;  Laterality: Bilateral;   HERNIA REPAIR     LYSIS OF ADHESION N/A 06/28/2019   Procedure: LYSIS OF ADHESION;  Surgeon: Virl Cagey, MD;  Location: [facility];  Service: General;  Laterality: N/A;   VENTRAL HERNIA REPAIR N/A 06/28/2019   Procedure: HERNIA REPAIR VENTRAL ADULT WITH VICRYL MESH OVERLAY;  Surgeon: Virl Cagey, MD;  Location: [facility];  Service: General;  Laterality: N/A;       Family History  Problem Relation Age of Onset   Heart attack Father    Lung cancer Sister  Social History   Tobacco Use   Smoking status: Former    Types: Cigarettes    Quit date: 01/20/1957    Years since quitting: 63.7   Smokeless tobacco: Never  Vaping Use   Vaping Use: Never used  Substance Use Topics   Alcohol use: No   Drug use: Never    Home Medications Prior to Admission medications   Medication Sig Start Date End Date Taking? Authorizing Provider  amiodarone (PACERONE) 200 MG tablet Take 200 mg by mouth daily.    [provider]  aspirin EC 81 MG tablet Take 81 mg by mouth daily. Swallow whole.     [provider]  carvedilol (COREG) 6.25 MG tablet Take 1.5 tablets (9.375 mg total) by mouth 2 (two) times daily. 10/01/20   Dunn, Nedra Hai, PA-C  Coenzyme Q10 200 MG capsule Take 200 mg by mouth daily.    [provider]  diclofenac Sodium (VOLTAREN) 1 % GEL as needed.    [provider]  finasteride (PROSCAR) 5 MG tablet Take 1 tablet (5 mg total) by mouth daily. 07/19/19   Gerlene Fee, NP  furosemide (LASIX) 20 MG tablet Take 2 tablets (40 mg total) by mouth daily. 07/19/19   Gerlene Fee, NP  gabapentin (NEURONTIN) 100 MG capsule Take 100 mg by mouth daily. 05/16/20   [provider]  hydrALAZINE (APRESOLINE) 50 MG tablet Take 1 tablet (50 mg total) by mouth 3 (three) times daily. 10/01/20 12/30/20  Dunn, Nedra Hai, PA-C  methimazole (TAPAZOLE) 10 MG tablet Take 2 tablets (20 mg total) by mouth 2 (two) times daily. 07/19/19   Gerlene Fee, NP  Multiple Vitamin (MULTIVITAMIN) tablet Take 1 tablet by mouth daily.    [provider]  nitroGLYCERIN (NITROSTAT) 0.4 MG SL tablet Place 1 tablet (0.4 mg total) under the tongue every 5 (five) minutes as needed. 07/19/19   Gerlene Fee, NP  NON FORMULARY Diet - NAS 07/04/19   [provider]  potassium chloride (KLOR-CON) 10 MEQ tablet Take 1 tablet (10 mEq total) by mouth every evening. 07/19/19   Gerlene Fee, NP  pravastatin (PRAVACHOL) 20 MG tablet Take 1 tablet (20 mg total) by mouth every evening. 06/22/20 09/20/20  Imogene Burn, PA-C  tamsulosin (FLOMAX) 0.4 MG CAPS capsule Take 1 capsule (0.4 mg total) by mouth daily after breakfast. 07/19/19   Gerlene Fee, NP    Allergies    Indomethacin, Iodine-131, and Lidocaine-menthol  Review of Systems   Review of Systems  Constitutional:  Negative for fever.  HENT:  Negative for ear pain and sore throat.   Eyes:  Negative for pain.  Respiratory:  Negative for cough.   Cardiovascular:  Negative for chest pain.  Gastrointestinal:   Negative for abdominal pain.  Genitourinary:  Positive for penile pain. Negative for flank pain.  Musculoskeletal:  Negative for back pain.  Skin:  Negative for color change and rash.  Neurological:  Negative for syncope.  All other systems reviewed and are negative.  Physical Exam Updated Vital Signs BP (!) 184/69 (BP Location: Right Arm)   Pulse 74   Temp 98 F (36.7 C) (Oral)   Resp 19   Ht 6' (1.829 m)   Wt (!) 144.2 kg   SpO2 98%   BMI 43.13 kg/m   Physical Exam Constitutional:      Appearance: He is well-developed.  HENT:     Head: Normocephalic.     Nose: Nose  normal.  Eyes:     Extraocular Movements: Extraocular movements intact.  Cardiovascular:     Rate and Rhythm: Normal rate.  Pulmonary:     Effort: Pulmonary effort is normal.  Genitourinary:    Comments: No swelling or erythema noted on exam.  No tenderness to testicles.  Foley catheter appears in place.  No drainage of urine or bleeding or erythema noted. Skin:    Coloration: Skin is not jaundiced.  Neurological:     Mental Status: He is alert. Mental status is at baseline.    ED Results / Procedures / Treatments   Labs (all labs ordered are listed, but only abnormal results are displayed) Labs Reviewed - No data to display  EKG None  Radiology No results found.  Procedures Procedures   Medications Ordered in ED Medications  acetaminophen (TYLENOL) tablet 650 mg (has no administration in time range)    ED Course  I have reviewed the triage vital signs and the nursing notes.  Pertinent labs & imaging results that were available during my care of the patient were reviewed by me and considered in my medical decision making (see chart for details).    MDM Rules/Calculators/A&P                           Catheter was flushed with removal of some mucus clots.  Subsequently increased urine output noted and patient's pain is resolved.  Continues to be symptom-free at this time.  Advised  outpatient follow-up again with his urologist within the week.  Advising return for fevers worsening symptoms or any additional concerns.  Final Clinical Impression(s) / ED Diagnoses Final diagnoses:  Obstruction of Foley catheter, initial encounter [facility])    Rx / [place] Orders ED Discharge Orders     None        Luna Fuse, MD 10/03/20 1553

## 2020-10-04 ENCOUNTER — Telehealth: Payer: Self-pay

## 2020-10-04 MED ORDER — PRAVASTATIN SODIUM 20 MG PO TABS: 20.0000 mg | ORAL_TABLET | Freq: Every evening | ORAL | 3 refills | Status: DC

## 2020-10-04 NOTE — Telephone Encounter (Signed)
90 day refill request for Pravastatin approved and sent to [facility] per pt's request.

## 2020-10-05 ENCOUNTER — Telehealth: Payer: Self-pay | Admitting: Physician Assistant

## 2020-10-05 NOTE — Telephone Encounter (Signed)
Received note from [facility]:  Summe, Derek Foster, Derek Foster, Derek Whitner N, PA-C Hi Yukiko Minnich,   I hope you are well.   Just wanted to reach out about a referral I received yesterday for medication mgmt for Mr. Biese from your office and from Dr. Juel Burrow office.  I reached out to patient today and reviewed medications with him.  He reports good adherence with medications and states he has all medications currently in his pillpacks from [facility].  He confirmed he knows which pill is amiodarone and will remove (per earlier instructions today) this from pack each day until updated pill packs arrive from pharmacy.  He also is aware of dosage change on thyroid meds and has pill splitter to cut tablets in half.  He declined further f/u from [facility].  He also declined CCM services from upstream pharmacist at Dr. Juel Burrow office yesterday.     If there is anything else I can do to assist, please let me know!   Thanks,  Ralene Bathe, PharmD, [place]  [phone]

## 2020-10-05 NOTE — Telephone Encounter (Signed)
Pt notified of plan of care and voiced understanding

## 2020-10-05 NOTE — Progress Notes (Signed)
I would be ok coming off Derek Alberts MD

## 2020-10-05 NOTE — Telephone Encounter (Signed)
Please let pt know I reviewed with Dr. Harl Bowie who is OK with patient discontinuing amiodarone. Notify for any unusual or irregular heart rates. Otherwise continue plan as discussed.

## 2020-10-11 ENCOUNTER — Other Ambulatory Visit: Payer: Self-pay

## 2020-10-11 ENCOUNTER — Encounter: Payer: Self-pay | Admitting: Urology

## 2020-10-11 ENCOUNTER — Ambulatory Visit: Payer: Medicare Other | Admitting: Urology

## 2020-10-11 VITALS — BP 146/71 | HR 75 | Temp 97.6°F | Ht 72.0 in | Wt 325.6 lb

## 2020-10-11 DIAGNOSIS — N401 Enlarged prostate with lower urinary tract symptoms: Secondary | ICD-10-CM

## 2020-10-11 DIAGNOSIS — N138 Other obstructive and reflux uropathy: Secondary | ICD-10-CM | POA: Diagnosis not present

## 2020-10-11 DIAGNOSIS — Z9181 History of falling: Secondary | ICD-10-CM | POA: Diagnosis not present

## 2020-10-11 DIAGNOSIS — D631 Anemia in chronic kidney disease: Secondary | ICD-10-CM | POA: Diagnosis not present

## 2020-10-11 DIAGNOSIS — I2510 Atherosclerotic heart disease of native coronary artery without angina pectoris: Secondary | ICD-10-CM | POA: Diagnosis not present

## 2020-10-11 DIAGNOSIS — R000 Tachycardia, unspecified: Secondary | ICD-10-CM | POA: Diagnosis not present

## 2020-10-11 DIAGNOSIS — E039 Hypothyroidism, unspecified: Secondary | ICD-10-CM | POA: Diagnosis not present

## 2020-10-11 DIAGNOSIS — I130 Hypertensive heart and chronic kidney disease with heart failure and stage 1 through stage 4 chronic kidney disease, or unspecified chronic kidney disease: Secondary | ICD-10-CM | POA: Diagnosis not present

## 2020-10-11 DIAGNOSIS — R310 Gross hematuria: Secondary | ICD-10-CM

## 2020-10-11 DIAGNOSIS — R338 Other retention of urine: Secondary | ICD-10-CM | POA: Diagnosis not present

## 2020-10-11 DIAGNOSIS — K436 Other and unspecified ventral hernia with obstruction, without gangrene: Secondary | ICD-10-CM | POA: Diagnosis not present

## 2020-10-11 DIAGNOSIS — I4891 Unspecified atrial fibrillation: Secondary | ICD-10-CM | POA: Diagnosis not present

## 2020-10-11 DIAGNOSIS — R339 Retention of urine, unspecified: Secondary | ICD-10-CM | POA: Diagnosis not present

## 2020-10-11 DIAGNOSIS — Z6841 Body Mass Index (BMI) 40.0 and over, adult: Secondary | ICD-10-CM | POA: Diagnosis not present

## 2020-10-11 DIAGNOSIS — I509 Heart failure, unspecified: Secondary | ICD-10-CM | POA: Diagnosis not present

## 2020-10-11 DIAGNOSIS — N189 Chronic kidney disease, unspecified: Secondary | ICD-10-CM | POA: Diagnosis not present

## 2020-10-11 IMAGING — US US RENAL
1 series · 14 of 25 positions shown · non-contrast
Comparison: No prior ultrasound. Unenhanced CT abdomen and pelvis
02/12/2019 is correlated.

CLINICAL DATA: 84-year-old with acute kidney injury and hematuria.

EXAM:
RENAL / URINARY TRACT ULTRASOUND COMPLETE

[Series 1: us renal · 14 of 30 slices shown]
[im 1/30]
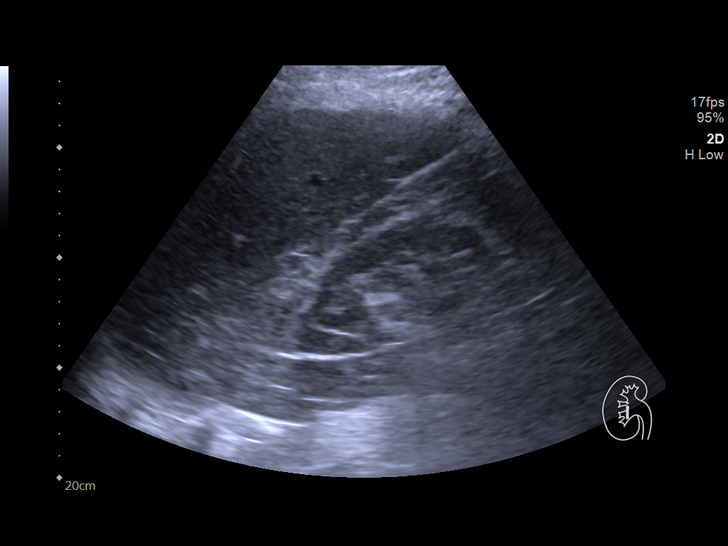
[im 3/30]
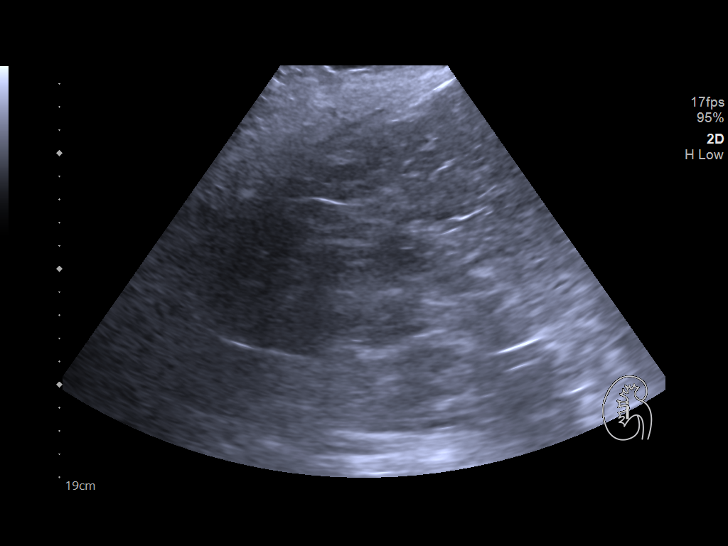
[im 5/30]
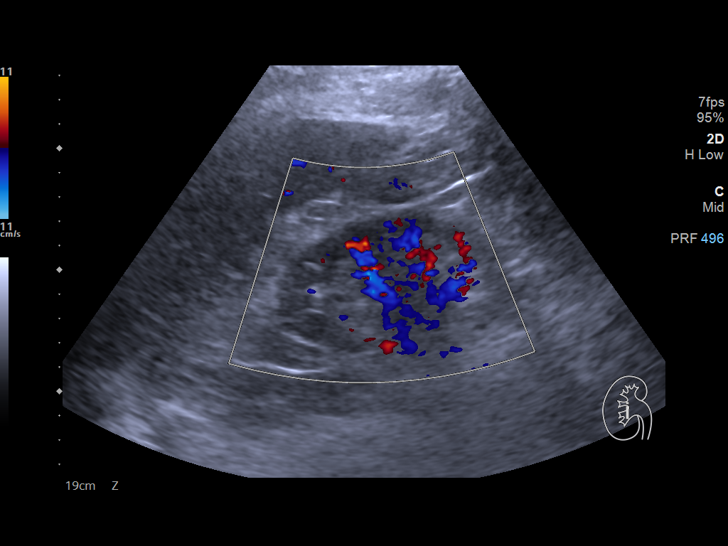
[im 8/30]
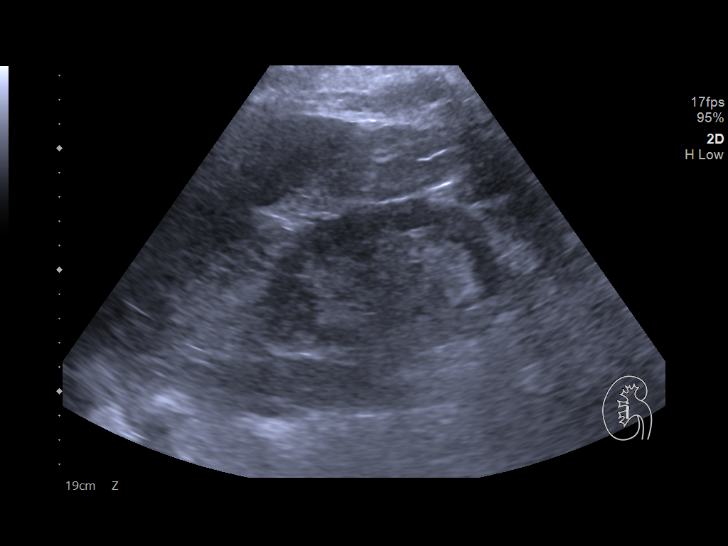
[im 10/30]
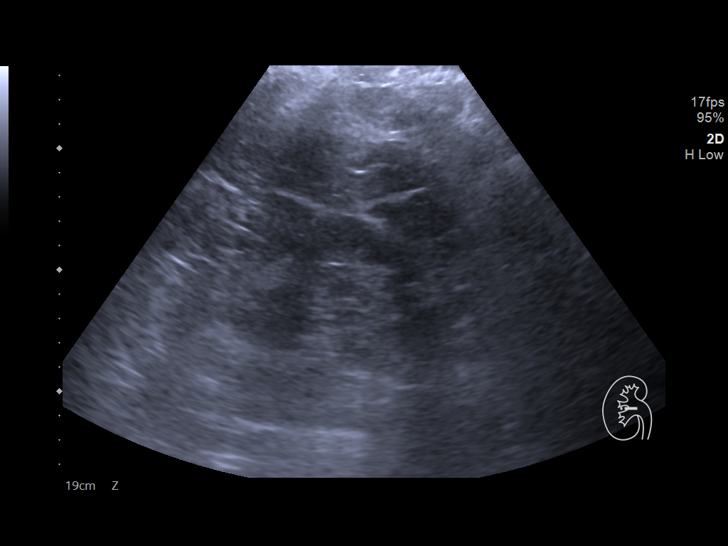
[im 11/30]
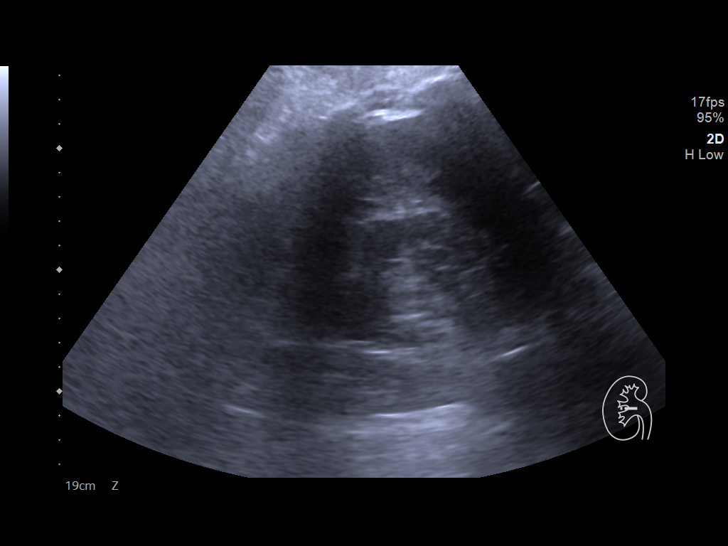
[im 14/30]
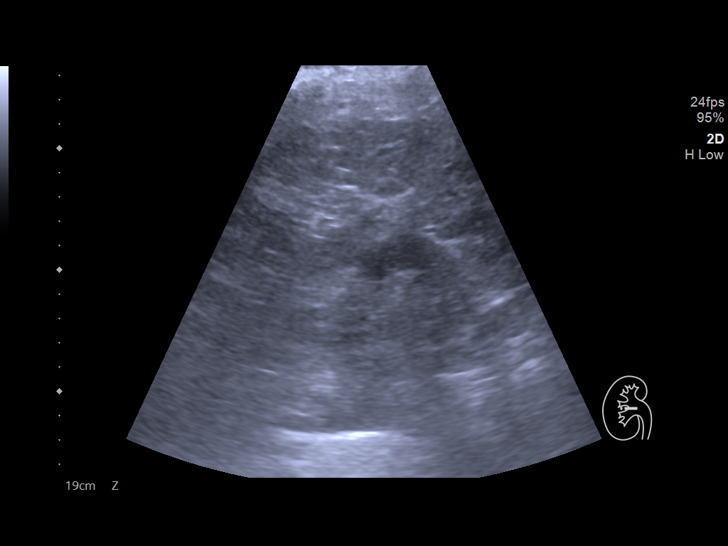
[im 16/30]
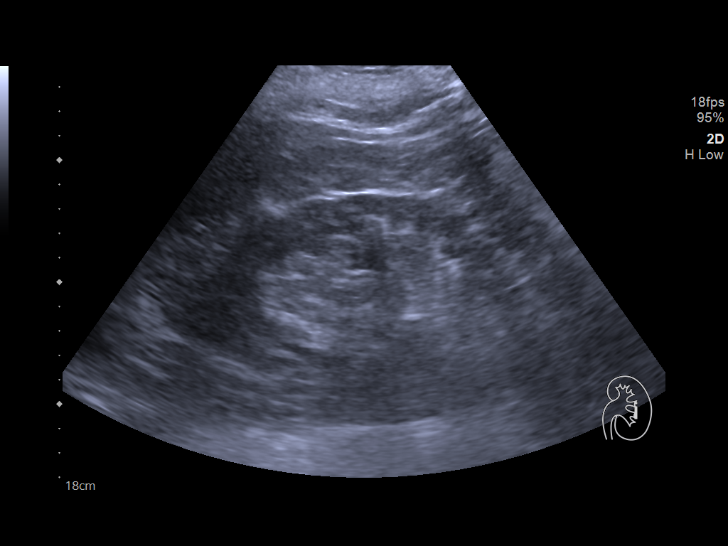
[im 19/30]
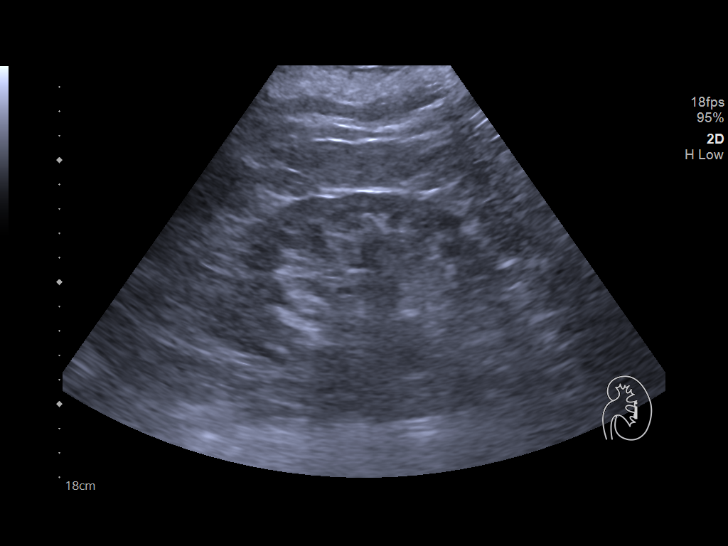
[im 20/30]
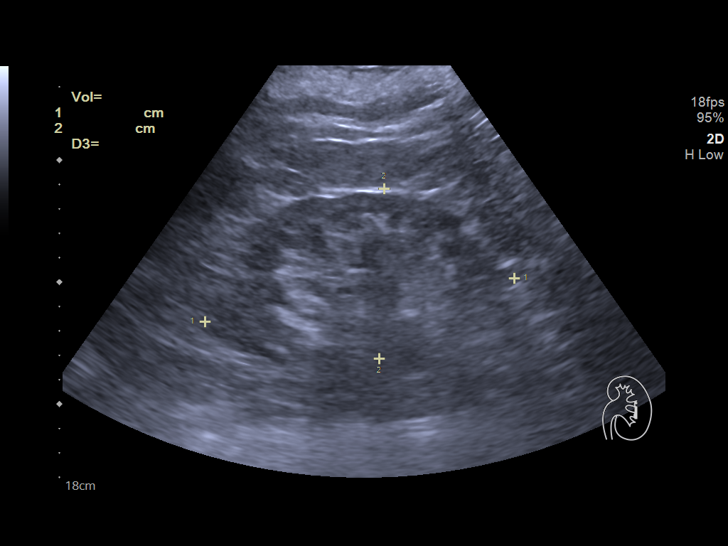
[im 22/30]
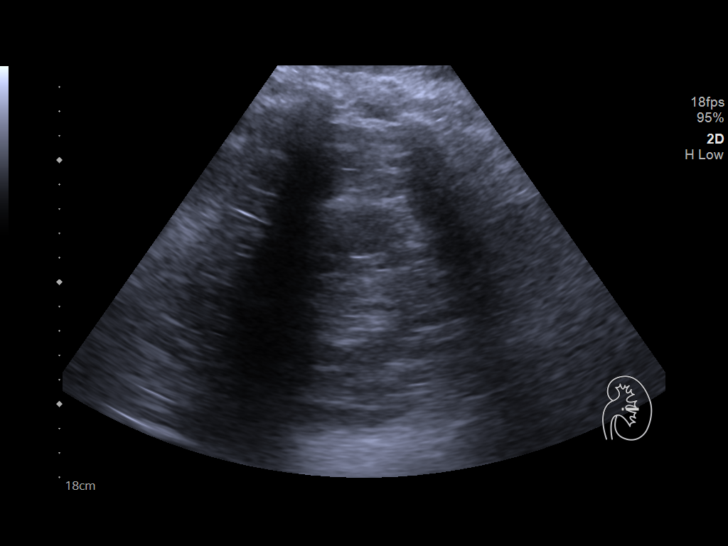
[im 25/30]
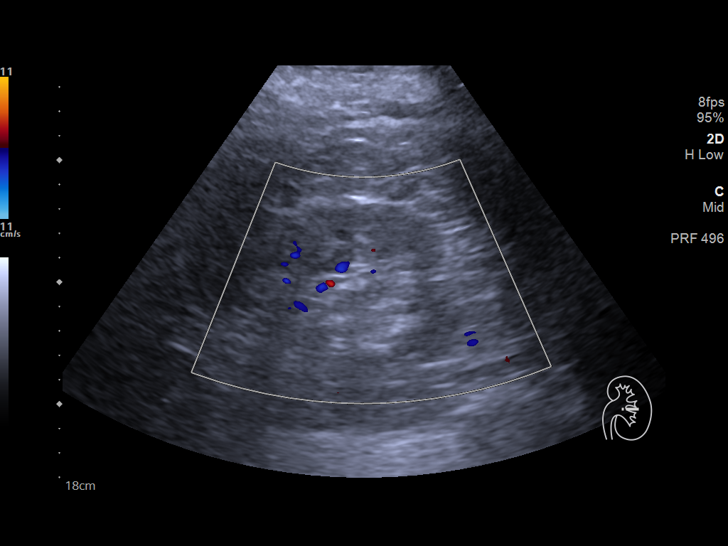
[im 27/30]
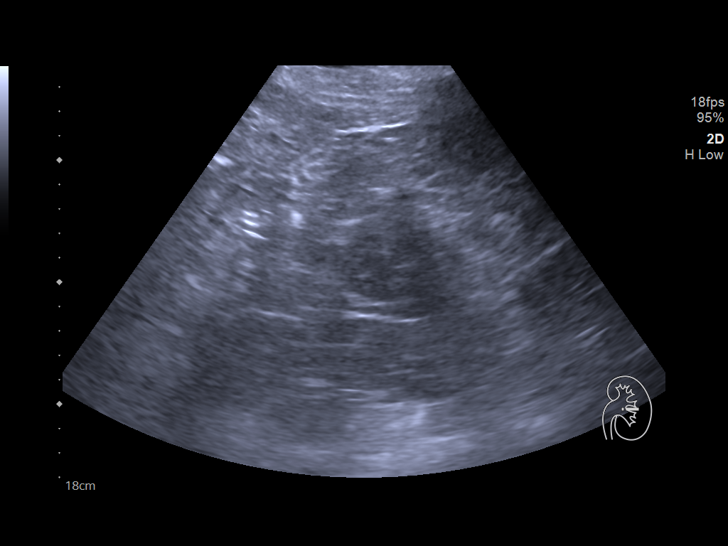
[im 30/30]
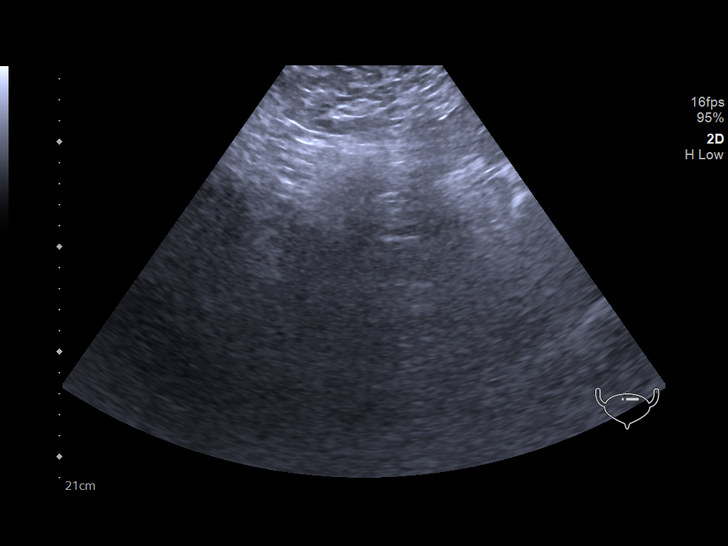

[14 of 25 positions shown; findings below may reference images not displayed]

FINDINGS: Right Kidney:

Renal measurements: Approximately 10.3 x 5.7 x 5.7 cm = volume: 176
mL. Normal parenchymal echotexture. Mild diffuse cortical thinning.
No hydronephrosis. No parenchymal masses. No visible shadowing
calculi.

Left Kidney:

Renal measurements: Approximately 12.8 x 7.0 x 8.6 cm = volume: 402
mL. Normal parenchymal echotexture. Mild diffuse cortical thinning.
No hydronephrosis. No parenchymal masses. No visible shadowing
calculi.

Bladder:

Obscured by overlying bowel gas.

Other:

None.
IMPRESSION: 1. No evidence of hydronephrosis involving either kidney to suggest
urinary tract obstruction.
2. Mild diffuse cortical thinning involving both kidneys. No focal
abnormality involving either kidney and no visible shadowing
calculi.

## 2020-10-11 MED ORDER — TAMSULOSIN HCL 0.4 MG PO CAPS: 0.4000 mg | ORAL_CAPSULE | Freq: Every day | ORAL | 0 refills | Status: DC

## 2020-10-11 NOTE — Progress Notes (Signed)
Urological Symptom Review  Patient is experiencing the following symptoms: Injury to kidneys/bladder   Review of Systems  Gastrointestinal (upper)  : Negative for upper GI symptoms  Gastrointestinal (lower) : Negative for lower GI symptoms  Constitutional : Negative for symptoms  Skin: Negative for skin symptoms  Eyes: Negative for eye symptoms  Ear/Nose/Throat : Negative for Ear/Nose/Throat symptoms  Hematologic/Lymphatic: Negative for Hematologic/Lymphatic symptoms  Cardiovascular : Negative for cardiovascular symptoms  Respiratory : Negative for respiratory symptoms  Endocrine: Negative for endocrine symptoms  Musculoskeletal: Negative for musculoskeletal symptoms  Neurological: Negative for neurological symptoms  Psychologic: Negative for psychiatric symptoms

## 2020-10-11 NOTE — Progress Notes (Signed)
Subjective: 1. Urinary retention   2. BPH with urinary obstruction   3. Gross hematuria      Consult requested by Dr. Wende Neighbors.  Derek Foster is an 85 yo male who was previously a patient of the [place] office.  He has a history of chronic retention managed with a foley that was changed on 09/27/20.  He had hematuria last year for about 2 weeks and had cystoscopy and clot evacuation.   He has occasional hematuria.   He was in the ER on Weds for an obstructed catheter from sediment and not blood.  He had a TUNA procedure remotely.  He has been on tamsulosin and finasteride since his episode of retention last year.   On CT on 06/25/19, the foley was in the bulbar urethra.  He had urodynamics last year and had a small capacity bladder with a good detrusor contracture.  ROS:  ROS  Allergies  Allergen Reactions   Indomethacin Anaphylaxis    But tolerates ibuprofen, aleve   Iodine-131 Anaphylaxis   Lidocaine-Menthol     Other reaction(s): Bradycardia    Past Medical History:  Diagnosis Date   AKI (acute kidney injury) ([place]) 02/12/2019   CAD (coronary artery disease)    a. cath 01/27/2009 : s/p promus DES to LAD and medical managment of 70-80% mRCA   Chronic kidney disease, stage 3b ([place])    COVID-19    Essential hypertension    GI bleeding    Hematuria    Hyperthyroidism    Hypothyroidism    Mild atherosclerosis of carotid artery    Mild pulmonary hypertension (HCC)    Mixed hyperlipidemia    NSTEMI (non-ST elevated myocardial infarction) ([place]) 2011   PAF (paroxysmal atrial fibrillation) ([place])     Past Surgical History:  Procedure Laterality Date   BOWEL RESECTION N/A 06/28/2019   Procedure: SMALL BOWEL RESECTION;  Surgeon: Virl Cagey, MD;  Location: [facility];  Service: General;  Laterality: N/A;   CHOLECYSTECTOMY     CORONARY STENT PLACEMENT     CYSTOSCOPY/RETROGRADE/URETEROSCOPY Bilateral 03/09/2019   Procedure: CYSTOSCOPY, Clot Evacuation, Fulguration;  Surgeon: Festus Aloe, MD;  Location: [place];  Service: Urology;  Laterality: Bilateral;   HERNIA REPAIR     LYSIS OF ADHESION N/A 06/28/2019   Procedure: LYSIS OF ADHESION;  Surgeon: Virl Cagey, MD;  Location: [facility];  Service: General;  Laterality: N/A;   VENTRAL HERNIA REPAIR N/A 06/28/2019   Procedure: HERNIA REPAIR VENTRAL ADULT WITH VICRYL MESH OVERLAY;  Surgeon: Virl Cagey, MD;  Location: [facility];  Service: General;  Laterality: N/A;    Social History   Socioeconomic History   Marital status: Married    Spouse name: Not on file   Number of children: Not on file   Years of education: Not on file   Highest education level: Not on file  Occupational History   Occupation: textiles  Tobacco Use   Smoking status: Former    Types: Cigarettes    Quit date: 01/20/1957    Years since quitting: 63.7   Smokeless tobacco: Never  Vaping Use   Vaping Use: Never used  Substance and Sexual Activity   Alcohol use: No   Drug use: Never   Sexual activity: Not on file  Other Topics Concern   Not on file  Social History Narrative   Quit smoking about 62 years ago.   Social Determinants of Health   Financial Resource Strain: Not on file  Food Insecurity: Not  on file  Transportation Needs: Not on file  Physical Activity: Not on file  Stress: Not on file  Social Connections: Not on file  Intimate Partner Violence: Not on file    Family History  Problem Relation Age of Onset   Heart attack Father    Lung cancer Sister     Anti-infectives: Anti-infectives (From admission, onward)    None       Current Outpatient Medications  Medication Sig Dispense Refill   aspirin EC 81 MG tablet Take 81 mg by mouth daily. Swallow whole.     carvedilol (COREG) 6.25 MG tablet Take 1.5 tablets (9.375 mg total) by mouth 2 (two) times daily. 270 tablet 3   Coenzyme Q10 200 MG capsule Take 200 mg by mouth daily.     diclofenac Sodium (VOLTAREN) 1 % GEL as needed.     finasteride (PROSCAR) 5 MG  tablet Take 1 tablet (5 mg total) by mouth daily. 30 tablet 0   furosemide (LASIX) 20 MG tablet Take 2 tablets (40 mg total) by mouth daily. 60 tablet 0   gabapentin (NEURONTIN) 100 MG capsule Take 100 mg by mouth daily.     hydrALAZINE (APRESOLINE) 50 MG tablet Take 1 tablet (50 mg total) by mouth 3 (three) times daily. 270 tablet 3   methimazole (TAPAZOLE) 10 MG tablet Take 2 tablets (20 mg total) by mouth 2 (two) times daily. 120 tablet 0   Multiple Vitamin (MULTIVITAMIN) tablet Take 1 tablet by mouth daily.     nitroGLYCERIN (NITROSTAT) 0.4 MG SL tablet Place 1 tablet (0.4 mg total) under the tongue every 5 (five) minutes as needed. 25 tablet 0   NON FORMULARY Diet - NAS     potassium chloride (KLOR-CON) 10 MEQ tablet Take 1 tablet (10 mEq total) by mouth every evening. 30 tablet 0   pravastatin (PRAVACHOL) 20 MG tablet Take 1 tablet (20 mg total) by mouth every evening. 90 tablet 3   tamsulosin (FLOMAX) 0.4 MG CAPS capsule Take 1 capsule (0.4 mg total) by mouth daily after breakfast. 30 capsule 0   No current facility-administered medications for this visit.     Objective: Vital signs in last 24 hours: BP (!) 146/71   Pulse 75   Temp 97.6 F (36.4 C)   Ht 6' (1.829 m)   Wt (!) 325 lb 9.6 oz (147.7 kg)   BMI 44.16 kg/m   Intake/Output from previous day: No intake/output data recorded. Intake/Output this shift: @IOTHISSHIFT @   Physical Exam  Lab Results:  Recent Results (from the past 2160 hour(s))  TSH     Status: Abnormal   Collection Time: 10/01/20  3:01 PM  Result Value Ref Range   TSH 7.084 (H) 0.350 - 4.500 uIU/mL    Comment: Performed by a 3rd Generation assay with a functional sensitivity of <=0.01 uIU/mL. Performed at [facility], [address], [place], [place] [address]   T4, free     Status: None   Collection Time: 10/01/20  3:02 PM  Result Value Ref Range   Free T4 0.80 0.61 - 1.12 ng/dL    Comment: (NOTE) Biotin ingestion may interfere with free T4  tests. If the results are inconsistent with the TSH level, previous test results, or the clinical presentation, then consider biotin interference. If needed, order repeat testing after stopping biotin. Performed at [facility], [place] [address] [place], [place] [address]   Comprehensive Metabolic Panel (CMET)     Status: Abnormal  Collection Time: 10/01/20  3:02 PM  Result Value Ref Range   Sodium 141 135 - 145 mmol/L   Potassium 4.2 3.5 - 5.1 mmol/L   Chloride 107 98 - 111 mmol/L   CO2 27 22 - 32 mmol/L   Glucose, Bld 104 (H) 70 - 99 mg/dL    Comment: Glucose reference range applies only to samples taken after fasting for at least 8 hours.   BUN 25 (H) 8 - 23 mg/dL   Creatinine, Ser 1.56 (H) 0.61 - 1.24 mg/dL   Calcium 8.6 (L) 8.9 - 10.3 mg/dL   Total Protein 6.8 6.5 - 8.1 g/dL   Albumin 3.7 3.5 - 5.0 g/dL   AST 16 15 - 41 U/L   ALT 15 0 - 44 U/L   Alkaline Phosphatase 96 38 - 126 U/L   Total Bilirubin 0.6 0.3 - 1.2 mg/dL   GFR, Estimated 44 (L) >60 mL/min    Comment: (NOTE) Calculated using the CKD-EPI Creatinine Equation (2021)    Anion gap 7 5 - 15    Comment: Performed at [facility], [address], [place], [place] [address]  CBC     Status: Abnormal   Collection Time: 10/01/20  3:02 PM  Result Value Ref Range   WBC 8.6 4.0 - 10.5 K/uL   RBC 3.90 (L) 4.22 - 5.81 MIL/uL   Hemoglobin 12.0 (L) 13.0 - 17.0 g/dL   HCT 37.6 (L) 39.0 - 52.0 %   MCV 96.4 80.0 - 100.0 fL   MCH 30.8 26.0 - 34.0 pg   MCHC 31.9 30.0 - 36.0 g/dL   RDW 14.0 11.5 - 15.5 %   Platelets 200 150 - 400 K/uL   nRBC 0.0 0.0 - 0.2 %    Comment: Performed at [facility], [address], [place], [place] [address]     BMET No results for input(s): NA, K, CL, CO2, GLUCOSE, BUN, CREATININE, CALCIUM in the last 72 hours. PT/INR No results for input(s): LABPROT, INR in the last 72 hours. ABG No results for input(s): PHART, HCO3 in the last 72 hours.  Invalid input(s): PCO2,  PO2  Studies/Results: I have reviewed his prior CT and urodynamics as well has his op and hospital notes.   Bladder scan is negative for residual urine.  Assessment/Plan: BPH with BOO and retention.   He has a chronic foley but had good detrusor function on the urodynamics.   I discussed options and will have him return for a voiding trial in 2 weeks.  He will stay on the tamsulosin and finasteride for now.  If he fails the voiding trial, we could consider a TURP if we can get cardiac clearance.   Meds ordered this encounter  Medications   tamsulosin (FLOMAX) 0.4 MG CAPS capsule    Sig: Take 1 capsule (0.4 mg total) by mouth daily after breakfast.    Dispense:  30 capsule    Refill:  0     Orders Placed This Encounter  Procedures   Urinalysis, Routine w reflex microscopic     Return in about 2 weeks (around 10/25/2020) for for a voiding trial and possible catheter replacement. .    CC: Dr. Wende Neighbors and Dr. Kerry Hough.      Irine Seal 10/11/2020 [phone]

## 2020-10-12 ENCOUNTER — Telehealth: Payer: Self-pay | Admitting: Cardiology

## 2020-10-12 NOTE — Telephone Encounter (Signed)
Called pt and pt stated that he was upset that he had two appointments in October, one for NV and a F/U. Pt wanted to cancel NV.

## 2020-10-12 NOTE — Telephone Encounter (Signed)
Per phone call from pt-   Pt states that since starting the carvedilol (COREG) 6.25 MG tablet   and the hydrALAZINE (APRESOLINE) 50 MG tablet that he has no energy and that he is so fatigued that he can't hardly do anything.   Please give pt a call @ [phone]

## 2020-10-15 ENCOUNTER — Ambulatory Visit: Payer: Medicare Other

## 2020-10-15 ENCOUNTER — Telehealth: Payer: Self-pay | Admitting: Cardiology

## 2020-10-15 NOTE — Telephone Encounter (Signed)
New Message    Pt c/o medication issue:  1. Name of Medication: hydrALAZINE (APRESOLINE) 50 MG tablet  2. How are you currently taking this medication (dosage and times per day)? 3 times a day  3. Are you having a reaction (difficulty breathing--STAT)? yes  4. What is your medication issue? Patient is extremely weak, he cant even get up to walk across the room to the bathroom

## 2020-10-15 NOTE — Telephone Encounter (Signed)
At OV of 10/01/20, hydralazine was increased to 50 mg TID.  His home health nurse told him today his BP was "140 over something" as he did not recall   He took his BP with his machine and reported 163/57, HR 59    He has not reached out to his pcp regarding his fatigue, tells me they decreased tapazole from 10 mg to 5 mg last week.   He says he has no idea which medication is causing him to be so weak.He says he will call Dr.Hall's office today.

## 2020-10-16 NOTE — Telephone Encounter (Signed)
I spoke with patient and relayed Dr.Branch's message, he still does not have pcp apt. I encouraged him to call again and make apt.

## 2020-10-16 NOTE — Telephone Encounter (Signed)
Fatigue is very difficult to sort out as it can come from so many causes. We already stopped one of his medications his amiodarone, before we stop any other medications he needs a thorough eval from his pcp for any other possible causes. These heart medications are beneficial for him and would not want to stop them unless was neccesary  Zandra Abts MD

## 2020-10-22 ENCOUNTER — Ambulatory Visit: Payer: Medicare Other

## 2020-10-25 ENCOUNTER — Ambulatory Visit: Payer: Medicare Other | Admitting: Urology

## 2020-10-25 ENCOUNTER — Other Ambulatory Visit: Payer: Self-pay

## 2020-10-25 VITALS — BP 179/76 | HR 74 | Temp 98.0°F | Wt 325.0 lb

## 2020-10-25 DIAGNOSIS — N401 Enlarged prostate with lower urinary tract symptoms: Secondary | ICD-10-CM

## 2020-10-25 DIAGNOSIS — R339 Retention of urine, unspecified: Secondary | ICD-10-CM | POA: Diagnosis not present

## 2020-10-25 DIAGNOSIS — N138 Other obstructive and reflux uropathy: Secondary | ICD-10-CM | POA: Diagnosis not present

## 2020-10-25 DIAGNOSIS — R310 Gross hematuria: Secondary | ICD-10-CM

## 2020-10-25 NOTE — Progress Notes (Signed)
Subjective: 1. BPH with urinary obstruction   2. Urinary retention   3. Gross hematuria       Consult requested by Dr. Wende Neighbors.  GU Hx: Derek Foster is an 85 yo male who was previously a patient of the [facility] office.  He has a history of chronic retention managed with a foley that was changed on 09/27/20.  He had hematuria last year for about 2 weeks and had cystoscopy and clot evacuation.   He has occasional hematuria.   He was in the ER on Weds for an obstructed catheter from sediment and not blood.  He had a TUNA procedure remotely.  He has been on tamsulosin and finasteride since his episode of retention last year.   On CT on 06/25/19, the foley was in the bulbar urethra.  He had urodynamics last year and had a small capacity bladder with a good detrusor contracture.   10/25/20: Derek Foster returns today for consideration of voiding trial.  He has had no more hematuria.  ROS:  ROS  Allergies  Allergen Reactions   Indomethacin Anaphylaxis    But tolerates ibuprofen, aleve   Iodine-131 Anaphylaxis   Lidocaine-Menthol     Other reaction(s): Bradycardia    Past Medical History:  Diagnosis Date   AKI (acute kidney injury) ([place]) 02/12/2019   CAD (coronary artery disease)    a. cath 01/27/2009 : s/p promus DES to LAD and medical managment of 70-80% mRCA   Chronic kidney disease, stage 3b ([place])    COVID-19    Essential hypertension    GI bleeding    Hematuria    Hyperthyroidism    Hypothyroidism    Mild atherosclerosis of carotid artery    Mild pulmonary hypertension (HCC)    Mixed hyperlipidemia    NSTEMI (non-ST elevated myocardial infarction) ([place]) 2011   PAF (paroxysmal atrial fibrillation) ([employer])     Past Surgical History:  Procedure Laterality Date   BOWEL RESECTION N/A 06/28/2019   Procedure: SMALL BOWEL RESECTION;  Surgeon: Virl Cagey, MD;  Location: [facility];  Service: General;  Laterality: N/A;   CHOLECYSTECTOMY     CORONARY STENT PLACEMENT      CYSTOSCOPY/RETROGRADE/URETEROSCOPY Bilateral 03/09/2019   Procedure: CYSTOSCOPY, Clot Evacuation, Fulguration;  Surgeon: Festus Aloe, MD;  Location: [facility];  Service: Urology;  Laterality: Bilateral;   HERNIA REPAIR     LYSIS OF ADHESION N/A 06/28/2019   Procedure: LYSIS OF ADHESION;  Surgeon: Virl Cagey, MD;  Location: [facility];  Service: General;  Laterality: N/A;   VENTRAL HERNIA REPAIR N/A 06/28/2019   Procedure: HERNIA REPAIR VENTRAL ADULT WITH VICRYL MESH OVERLAY;  Surgeon: Virl Cagey, MD;  Location: [facility];  Service: General;  Laterality: N/A;    Social History   Socioeconomic History   Marital status: Married    Spouse name: Not on file   Number of children: Not on file   Years of education: Not on file   Highest education level: Not on file  Occupational History   Occupation: textiles  Tobacco Use   Smoking status: Former    Types: Cigarettes    Quit date: 01/20/1957    Years since quitting: 63.8   Smokeless tobacco: Never  Vaping Use   Vaping Use: Never used  Substance and Sexual Activity   Alcohol use: No   Drug use: Never   Sexual activity: Not on file  Other Topics Concern   Not on file  Social History Narrative   Quit smoking about  62 years ago.   Social Determinants of Health   Financial Resource Strain: Not on file  Food Insecurity: Not on file  Transportation Needs: Not on file  Physical Activity: Not on file  Stress: Not on file  Social Connections: Not on file  Intimate Partner Violence: Not on file    Family History  Problem Relation Age of Onset   Heart attack Father    Lung cancer Sister     Anti-infectives: Anti-infectives (From admission, onward)    None       Current Outpatient Medications  Medication Sig Dispense Refill   aspirin EC 81 MG tablet Take 81 mg by mouth daily. Swallow whole.     carvedilol (COREG) 6.25 MG tablet Take 1.5 tablets (9.375 mg total) by mouth 2 (two) times daily. 270 tablet 3   Coenzyme Q10  200 MG capsule Take 200 mg by mouth daily.     diclofenac Sodium (VOLTAREN) 1 % GEL as needed.     finasteride (PROSCAR) 5 MG tablet Take 1 tablet (5 mg total) by mouth daily. 30 tablet 0   furosemide (LASIX) 20 MG tablet Take 2 tablets (40 mg total) by mouth daily. 60 tablet 0   gabapentin (NEURONTIN) 100 MG capsule Take 100 mg by mouth daily.     hydrALAZINE (APRESOLINE) 50 MG tablet Take 1 tablet (50 mg total) by mouth 3 (three) times daily. 270 tablet 3   methimazole (TAPAZOLE) 10 MG tablet Take 2 tablets (20 mg total) by mouth 2 (two) times daily. 120 tablet 0   Multiple Vitamin (MULTIVITAMIN) tablet Take 1 tablet by mouth daily.     nitroGLYCERIN (NITROSTAT) 0.4 MG SL tablet Place 1 tablet (0.4 mg total) under the tongue every 5 (five) minutes as needed. 25 tablet 0   NON FORMULARY Diet - NAS     potassium chloride (KLOR-CON) 10 MEQ tablet Take 1 tablet (10 mEq total) by mouth every evening. 30 tablet 0   pravastatin (PRAVACHOL) 20 MG tablet Take 1 tablet (20 mg total) by mouth every evening. 90 tablet 3   tamsulosin (FLOMAX) 0.4 MG CAPS capsule Take 1 capsule (0.4 mg total) by mouth daily after breakfast. 30 capsule 0   No current facility-administered medications for this visit.     Objective: Vital signs in last 24 hours: BP (!) 179/76   Pulse 74   Temp 98 F (36.7 C)   Wt (!) 325 lb (147.4 kg)   BMI 44.08 kg/m   Intake/Output from previous day: No intake/output data recorded. Intake/Output this shift: @IOTHISSHIFT @   Physical Exam  Lab Results:  Recent Results (from the past 2160 hour(s))  TSH     Status: Abnormal   Collection Time: 10/01/20  3:01 PM  Result Value Ref Range   TSH 7.084 (H) 0.350 - 4.500 uIU/mL    Comment: Performed by a 3rd Generation assay with a functional sensitivity of <=0.01 uIU/mL. Performed at [facility], [address], [place], [place] [address]   T4, free     Status: None   Collection Time: 10/01/20  3:02 PM  Result Value Ref Range    Free T4 0.80 0.61 - 1.12 ng/dL    Comment: (NOTE) Biotin ingestion may interfere with free T4 tests. If the results are inconsistent with the TSH level, previous test results, or the clinical presentation, then consider biotin interference. If needed, order repeat testing after stopping biotin. Performed at [facility], [place] [address], [place], [place] [address]  Comprehensive Metabolic Panel (CMET)     Status: Abnormal   Collection Time: 10/01/20  3:02 PM  Result Value Ref Range   Sodium 141 135 - 145 mmol/L   Potassium 4.2 3.5 - 5.1 mmol/L   Chloride 107 98 - 111 mmol/L   CO2 27 22 - 32 mmol/L   Glucose, Bld 104 (H) 70 - 99 mg/dL    Comment: Glucose reference range applies only to samples taken after fasting for at least 8 hours.   BUN 25 (H) 8 - 23 mg/dL   Creatinine, Ser 1.56 (H) 0.61 - 1.24 mg/dL   Calcium 8.6 (L) 8.9 - 10.3 mg/dL   Total Protein 6.8 6.5 - 8.1 g/dL   Albumin 3.7 3.5 - 5.0 g/dL   AST 16 15 - 41 U/L   ALT 15 0 - 44 U/L   Alkaline Phosphatase 96 38 - 126 U/L   Total Bilirubin 0.6 0.3 - 1.2 mg/dL   GFR, Estimated 44 (L) >60 mL/min    Comment: (NOTE) Calculated using the CKD-EPI Creatinine Equation (2021)    Anion gap 7 5 - 15    Comment: Performed at [facility], [address] [place], [place] [address]  CBC     Status: Abnormal   Collection Time: 10/01/20  3:02 PM  Result Value Ref Range   WBC 8.6 4.0 - 10.5 K/uL   RBC 3.90 (L) 4.22 - 5.81 MIL/uL   Hemoglobin 12.0 (L) 13.0 - 17.0 g/dL   HCT 37.6 (L) 39.0 - 52.0 %   MCV 96.4 80.0 - 100.0 fL   MCH 30.8 26.0 - 34.0 pg   MCHC 31.9 30.0 - 36.0 g/dL   RDW 14.0 11.5 - 15.5 %   Platelets 200 150 - 400 K/uL   nRBC 0.0 0.0 - 0.2 %    Comment: Performed at [facility], [address] [place], [place] [address]     BMET No results for input(s): NA, K, CL, CO2, GLUCOSE, BUN, CREATININE, CALCIUM in the last 72 hours. PT/INR No results for input(s): LABPROT, INR in the last 72 hours. ABG No  results for input(s): PHART, HCO3 in the last 72 hours.  Invalid input(s): PCO2, PO2  Studies/Results: Bladder filled with 200mg  but it all leaked out around the catheter.  His foley was changed.   Assessment/Plan: BPH with BOO and retention.   He has a chronic foley but had good detrusor function on the urodynamics.  I was going to give him a voiding trial today but he didn't want to risk having to come back later today. He will stay on the tamsulosin and finasteride for now and continue the monthly catheter changes.   The cath was changed today.   No orders of the defined types were placed in this encounter.    No orders of the defined types were placed in this encounter.    Return in about 6 months (around 04/25/2021).    CC: Dr. Wende Neighbors and Dr. Kerry Hough.      Irine Seal 10/25/2020 [phone] Patient ID: Derek Foster, male   DOB: Jul 03, 1935, 85 y.o.   MRN: [id number]

## 2020-10-25 NOTE — Progress Notes (Signed)
Urological Symptom Review  Patient is experiencing the following symptoms: Blood in urine Injury to kidneys/bladder   Review of Systems  Gastrointestinal (upper)  : Negative for upper GI symptoms  Gastrointestinal (lower) : Negative for lower GI symptoms  Constitutional : Fatigue  Skin: Skin rash/lesion  Eyes: Negative for eye symptoms  Ear/Nose/Throat : Sinus problems  Hematologic/Lymphatic: Easy bruising  Cardiovascular : Leg swelling  Respiratory : Cough Shortness of breath  Endocrine: Negative for endocrine symptoms  Musculoskeletal: Joint pain  Neurological: Negative for neurological symptoms  Psychologic: Negative for psychiatric symptoms

## 2020-10-30 ENCOUNTER — Ambulatory Visit (INDEPENDENT_AMBULATORY_CARE_PROVIDER_SITE_OTHER): Payer: Medicare Other

## 2020-10-30 ENCOUNTER — Other Ambulatory Visit: Payer: Self-pay

## 2020-10-30 DIAGNOSIS — R339 Retention of urine, unspecified: Secondary | ICD-10-CM

## 2020-10-30 DIAGNOSIS — N401 Enlarged prostate with lower urinary tract symptoms: Secondary | ICD-10-CM

## 2020-10-30 DIAGNOSIS — N138 Other obstructive and reflux uropathy: Secondary | ICD-10-CM | POA: Diagnosis not present

## 2020-10-30 NOTE — Patient Instructions (Signed)

## 2020-10-30 NOTE — Progress Notes (Signed)
Patient removed catheter himself yesterday due to pain from non draining catheter and has been unable to hold urine. Patient request to come in office to have catheter replaced. Patient noted to have voided all in floor and in clothes in exam room.  Simple Catheter Placement  Due to urinary incontinence patient is present today for a foley cath placement.  Patient was cleaned and prepped in a sterile fashion with betadine. A 18 coude FR foley catheter was inserted, urine return was noted  124ml, urine was yellow in color.  The balloon was filled with 10cc of sterile water.  A leg bag was attached for drainage.  Patient was given instruction on proper catheter care.  Patient tolerated well, no complications were noted   Performed by: Bonni Neuser LPN  Additional notes/ Follow up: keep next scheduled ov

## 2020-11-05 DIAGNOSIS — I482 Chronic atrial fibrillation, unspecified: Secondary | ICD-10-CM | POA: Diagnosis not present

## 2020-11-05 DIAGNOSIS — E039 Hypothyroidism, unspecified: Secondary | ICD-10-CM | POA: Diagnosis not present

## 2020-11-05 DIAGNOSIS — R972 Elevated prostate specific antigen [PSA]: Secondary | ICD-10-CM | POA: Diagnosis not present

## 2020-11-05 DIAGNOSIS — E876 Hypokalemia: Secondary | ICD-10-CM | POA: Diagnosis not present

## 2020-11-05 DIAGNOSIS — I509 Heart failure, unspecified: Secondary | ICD-10-CM | POA: Diagnosis not present

## 2020-11-07 ENCOUNTER — Other Ambulatory Visit: Payer: Self-pay

## 2020-11-07 NOTE — Progress Notes (Signed)
Worried he will   Cardiology Office Note    Date:  11/13/2020   ID:  Derek Foster, DOB 1935-05-13, MRN [id number]   PCP:  Celene Squibb, MD   [facility]  Cardiologist:  Carlyle Dolly, MD   Advanced Practice Provider:  No care team member to display Electrophysiologist:  None   [id number]   Chief Complaint  Patient presents with   Fatigue     History of Present Illness:  Derek Foster is a 85 y.o. male  with a hx of CAD, hx MI PCI to LAD 2011, HTN, HLD, PAF in the setting of COVID-19 and Small bowel obstruction 01/2019, hyperthyroid       Patient had hernia repair 06/2019 and Eliquis was on hold.  He then developed guaiac positive stools and hemoglobin ranging in the 8-8.5 range.  Eliquis was never restarted.  He had maintained normal sinus rhythm on amiodarone.Last echo 01/2019 EF 60-65%, severe LAE, mildly elevated PA systolic pressure with indeterminate diastolic function.  Amlodipine stopped and started on hydralazine for lower extremity edema  and metoprolol changed to carvedilol.  Patient saw Melina Copa, PA-C 10/01/2020 and complained of fatigue.  She discussed with Dr. Harl Bowie and decided to stop his amiodarone. He called back with ongoing fatigue and Dr. Harl Bowie recommended full workup by PCP before stopping any more meds.  Patient comes in for f/u walking with a walker. Continues to gain weight 280 01/2020, 316 5/2022321 lbs 10/01/20, 324 lbs today. Dr. [place] Crane increased lasix 40 mg in am 20 mg in pm for 4 days and it hasn't made any difference. Says he's not eating a lot. Fatigue hasn't improved off amiodarone. He complains of heart beating in his head after am meds. BP running 140-150's 60's. Hospice no longer coming out.   Past Medical History:  Diagnosis Date   AKI (acute kidney injury) ([place]) 02/12/2019   CAD (coronary artery disease)    a. cath 01/27/2009 : s/p promus DES to LAD and medical managment of 70-80% mRCA   Chronic kidney disease, stage 3b  ([place])    COVID-19    Essential hypertension    GI bleeding    Hematuria    Hyperthyroidism    Hypothyroidism    Mild atherosclerosis of carotid artery    Mild pulmonary hypertension (HCC)    Mixed hyperlipidemia    NSTEMI (non-ST elevated myocardial infarction) ([place]) 2011   PAF (paroxysmal atrial fibrillation) ([place])     Past Surgical History:  Procedure Laterality Date   BOWEL RESECTION N/A 06/28/2019   Procedure: SMALL BOWEL RESECTION;  Surgeon: Virl Cagey, MD;  Location: [facility];  Service: General;  Laterality: N/A;   CHOLECYSTECTOMY     CORONARY STENT PLACEMENT     CYSTOSCOPY/RETROGRADE/URETEROSCOPY Bilateral 03/09/2019   Procedure: CYSTOSCOPY, Clot Evacuation, Fulguration;  Surgeon: Festus Aloe, MD;  Location: [facility];  Service: Urology;  Laterality: Bilateral;   HERNIA REPAIR     LYSIS OF ADHESION N/A 06/28/2019   Procedure: LYSIS OF ADHESION;  Surgeon: Virl Cagey, MD;  Location: [facility];  Service: General;  Laterality: N/A;   VENTRAL HERNIA REPAIR N/A 06/28/2019   Procedure: HERNIA REPAIR VENTRAL ADULT WITH VICRYL MESH OVERLAY;  Surgeon: Virl Cagey, MD;  Location: [facility];  Service: General;  Laterality: N/A;    Current Medications: Current Meds  Medication Sig   aspirin EC 81 MG tablet Take 81 mg by mouth daily. Swallow whole.   carvedilol (COREG) 6.25  MG tablet Take 1.5 tablets (9.375 mg total) by mouth 2 (two) times daily.   Coenzyme Q10 200 MG capsule Take 200 mg by mouth daily.   diclofenac Sodium (VOLTAREN) 1 % GEL as needed.   finasteride (PROSCAR) 5 MG tablet Take 1 tablet (5 mg total) by mouth daily.   furosemide (LASIX) 20 MG tablet Take 2 tablets (40 mg total) by mouth daily.   gabapentin (NEURONTIN) 100 MG capsule Take 100 mg by mouth daily.   hydrALAZINE (APRESOLINE) 50 MG tablet Take 1 tablet (50 mg total) by mouth 3 (three) times daily.   methimazole (TAPAZOLE) 10 MG tablet Take 2 tablets (20 mg total) by mouth 2 (two) times daily.    Multiple Vitamin (MULTIVITAMIN) tablet Take 1 tablet by mouth daily.   nitroGLYCERIN (NITROSTAT) 0.4 MG SL tablet Place 1 tablet (0.4 mg total) under the tongue every 5 (five) minutes as needed.   NON FORMULARY Diet - NAS   potassium chloride (KLOR-CON) 10 MEQ tablet Take 1 tablet (10 mEq total) by mouth every evening.   pravastatin (PRAVACHOL) 20 MG tablet Take 1 tablet (20 mg total) by mouth every evening.   tamsulosin (FLOMAX) 0.4 MG CAPS capsule Take 1 capsule (0.4 mg total) by mouth daily after breakfast.     Allergies:   Indomethacin, Iodine-131, and Lidocaine-menthol   Social History   Socioeconomic History   Marital status: Married    Spouse name: Not on file   Number of children: Not on file   Years of education: Not on file   Highest education level: Not on file  Occupational History   Occupation: textiles  Tobacco Use   Smoking status: Former    Types: Cigarettes    Quit date: 01/20/1957    Years since quitting: 63.8   Smokeless tobacco: Never  Vaping Use   Vaping Use: Never used  Substance and Sexual Activity   Alcohol use: No   Drug use: Never   Sexual activity: Not on file  Other Topics Concern   Not on file  Social History Narrative   Quit smoking about 62 years ago.   Social Determinants of Health   Financial Resource Strain: Not on file  Food Insecurity: Not on file  Transportation Needs: Not on file  Physical Activity: Not on file  Stress: Not on file  Social Connections: Not on file     Family History:  The patient's  family history includes Heart attack in his father; Lung cancer in his sister.   ROS:   Please see the history of present illness.    ROS All other systems reviewed and are negative.   PHYSICAL EXAM:   VS:  BP (!) 158/56   Pulse 70   Ht 6' (1.829 m)   Wt (!) 324 lb (147 kg)   SpO2 97%   BMI 43.94 kg/m   Physical Exam  GEN: Obese, in no acute distress  Neck: no JVD, carotid bruits, or masses Cardiac:RRR; no murmurs, rubs,  or gallops  Respiratory:  clear to auscultation bilaterally, normal work of breathing GI: soft, nontender, nondistended, + BS Ext: +1 edema bilaterally with brawny changes  neuro:  Alert and Oriented x 3 Psych: euthymic mood, full affect  Wt Readings from Last 3 Encounters:  11/13/20 (!) 324 lb (147 kg)  10/25/20 (!) 325 lb (147.4 kg)  10/11/20 (!) 325 lb 9.6 oz (147.7 kg)      Studies/Labs Reviewed:   EKG:  EKG is not ordered today.  Recent Labs: 10/01/2020: ALT 15; BUN 25; Creatinine, Ser 1.56; Hemoglobin 12.0; Platelets 200; Potassium 4.2; Sodium 141; TSH 7.084   Lipid Panel    Component Value Date/Time   CHOL 138 05/28/2011 1030   TRIG 81 05/28/2011 1030   HDL 43 05/28/2011 1030   CHOLHDL 3.2 05/28/2011 1030   VLDL 16 05/28/2011 1030   LDLCALC 79 05/28/2011 1030    Additional studies/ records that were reviewed today include:  Echo 02/13/2019- IMPRESSIONS     1. Left ventricular ejection fraction, by visual estimation, is 60 to  65%. The left ventricle has normal function. There is moderately increased  left ventricular hypertrophy.   2. Definity contrast agent was given IV to delineate the left ventricular  endocardial borders.   3. Left ventricular diastolic parameters are indeterminate.   4. The left ventricle has no regional wall motion abnormalities.   5. Global right ventricle has normal systolic function.The right  ventricular size is normal. Right vetricular wall thickness was not  assessed.   6. Left atrial size was severely dilated.   7. Right atrial size was normal.   8. The mitral valve is grossly normal. No evidence of mitral valve  regurgitation.   9. The tricuspid valve is grossly normal.  10. The tricuspid valve is grossly normal. Tricuspid valve regurgitation  is mild.  11. The aortic valve is tricuspid. Aortic valve regurgitation is not  visualized. No evidence of aortic valve sclerosis or stenosis.  12. The pulmonic valve was grossly  normal. Pulmonic valve regurgitation is  not visualized.  13. Mildly elevated pulmonary artery systolic pressure.  14. The interatrial septum was not well visualized.           Risk Assessment/Calculations:    CHA2DS2-VASc Score = 5   This indicates a 7.2% annual risk of stroke. The patient's score is based upon: CHF History: 1 HTN History: 1 Diabetes History: 0 Stroke History: 0 Vascular Disease History: 1 Age Score: 2 Gender Score: 0        ASSESSMENT:    1. PAF (paroxysmal atrial fibrillation) ([place])   2. CAD in native artery   3. Essential hypertension   4. Chronic diastolic CHF (congestive heart failure) ([place])   5. Hyperlipidemia LDL goal <70   6. Chronic fatigue   7. Need for immunization against influenza      PLAN:  In order of problems listed above:  PAF amiodarone stopped due to possible cause of fatigue-has been off since 10/05/20 and not feeling any better-will give more time for amiodarone washout but will likely go back into A. fib and not tolerate very well.  If his heart rate gets over 100 they should call and we will increase the carvedilol.  CAD MI PCI LAD 2011-no angina  HTN with CKD off amlodipine due to edema-didn't make any difference in edema so can use again if needed.  Chronic diastolic CHF/chronic LE edema-Dr. Nevada Crane increase his Lasix to 60 mg daily for the past 4 days and it has not made any difference.  Actually his edema is down on exam and chronic and most likely due to weight gain and not fluid overload.  Check renal today.  HLD goal <70 but LDL 82 no changes made  Fatigue and weight gain TSH was 7 last month.  He is on Tapazole.  He also has an elevated PSA and will be seeing Dr. Jeffie Pollock soon.  Amiodarone and amlodipine have been stopped with no improvement in his fatigue.  Dr. Harl Bowie recommended full work-up by PCP before stopping any other meds.  Blood pressure is running in the 140s and 150s so cannot stop any more  medications.  Shared Decision Making/Informed Consent        Medication Adjustments/Labs and Tests Ordered: Current medicines are reviewed at length with the patient today.  Concerns regarding medicines are outlined above.  Medication changes, Labs and Tests ordered today are listed in the Patient Instructions below. Patient Instructions  Medication Instructions:  Your physician recommends that you continue on your current medications as directed. Please refer to the Current Medication list given to you today.  *If you need a refill on your cardiac medications before your next appointment, please call your pharmacy*   Lab Work:  BMET Today  If you have labs (blood work) drawn today and your tests are completely normal, you will receive your results only by: [place] (if you have MyChart) OR A paper copy in the mail If you have any lab test that is abnormal or we need to change your treatment, we will call you to review the results.   Testing/Procedures: None today    Follow-Up: At [facility], you and your health needs are our priority.  As part of our continuing mission to provide you with exceptional heart care, we have created designated Provider Care Teams.  These Care Teams include your primary Cardiologist (physician) and Advanced Practice Providers (APPs -  Physician Assistants and Nurse Practitioners) who all work together to provide you with the care you need, when you need it.  We recommend signing up for the patient portal called "MyChart".  Sign up information is provided on this After Visit Summary.  MyChart is used to connect with patients for Virtual Visits (Telemedicine).  Patients are able to view lab/test results, encounter notes, upcoming appointments, etc.  Non-urgent messages can be sent to your provider as well.   To learn more about what you can do with MyChart, go to [email or web address].    Your next appointment:   2-3 month(s)  The format  for your next appointment:   In Person  Provider:   Carlyle Dolly, MD   Other Instructions  Call us if heart rate goes over 100     Signed, Ermalinda Barrios, [place]  11/13/2020 2:44 PM    [place], [place], [place]  [address] Phone: [phone]; Fax: [phone]

## 2020-11-13 ENCOUNTER — Other Ambulatory Visit (HOSPITAL_COMMUNITY)
Admission: RE | Admit: 2020-11-13 | Discharge: 2020-11-13 | Disposition: A | Discharge status: Home / Self Care | Payer: Medicare Other | Source: Ambulatory Visit | Attending: Physician Assistant | Admitting: Physician Assistant

## 2020-11-13 ENCOUNTER — Other Ambulatory Visit: Payer: Self-pay

## 2020-11-13 ENCOUNTER — Ambulatory Visit: Payer: Medicare Other | Admitting: Physician Assistant

## 2020-11-13 ENCOUNTER — Encounter: Payer: Self-pay | Admitting: Physician Assistant

## 2020-11-13 VITALS — BP 158/56 | HR 70 | Ht 72.0 in | Wt 324.0 lb

## 2020-11-13 DIAGNOSIS — I10 Essential (primary) hypertension: Secondary | ICD-10-CM | POA: Diagnosis not present

## 2020-11-13 DIAGNOSIS — I2510 Atherosclerotic heart disease of native coronary artery without angina pectoris: Secondary | ICD-10-CM

## 2020-11-13 DIAGNOSIS — Z23 Encounter for immunization: Secondary | ICD-10-CM

## 2020-11-13 DIAGNOSIS — I5032 Chronic diastolic (congestive) heart failure: Secondary | ICD-10-CM | POA: Insufficient documentation

## 2020-11-13 DIAGNOSIS — I480 Paroxysmal atrial fibrillation: Secondary | ICD-10-CM | POA: Diagnosis not present

## 2020-11-13 DIAGNOSIS — R5382 Chronic fatigue, unspecified: Secondary | ICD-10-CM

## 2020-11-13 DIAGNOSIS — E785 Hyperlipidemia, unspecified: Secondary | ICD-10-CM

## 2020-11-13 LAB — BASIC METABOLIC PANEL
Anion gap: 8 (ref 5–15)
BUN: 34 mg/dL — ABNORMAL HIGH (ref 8–23)
CO2: 27 mmol/L (ref 22–32)
Calcium: 8.4 mg/dL — ABNORMAL LOW (ref 8.9–10.3)
Chloride: 108 mmol/L (ref 98–111)
Creatinine, Ser: 1.81 mg/dL — ABNORMAL HIGH (ref 0.61–1.24)
GFR, Estimated: 37 mL/min — ABNORMAL LOW (ref >60)
Glucose, Bld: 103 mg/dL — ABNORMAL HIGH (ref 70–99)
Potassium: 4.2 mmol/L (ref 3.5–5.1)
Sodium: 143 mmol/L (ref 135–145)

## 2020-11-13 NOTE — Patient Instructions (Signed)
Medication Instructions:  Your physician recommends that you continue on your current medications as directed. Please refer to the Current Medication list given to you today.  *If you need a refill on your cardiac medications before your next appointment, please call your pharmacy*   Lab Work:  BMET Today  If you have labs (blood work) drawn today and your tests are completely normal, you will receive your results only by: [place] (if you have MyChart) OR A paper copy in the mail If you have any lab test that is abnormal or we need to change your treatment, we will call you to review the results.   Testing/Procedures: None today    Follow-Up: At [facility], you and your health needs are our priority.  As part of our continuing mission to provide you with exceptional heart care, we have created designated Provider Care Teams.  These Care Teams include your primary Cardiologist (physician) and Advanced Practice Providers (APPs -  Physician Assistants and Nurse Practitioners) who all work together to provide you with the care you need, when you need it.  We recommend signing up for the patient portal called "MyChart".  Sign up information is provided on this After Visit Summary.  MyChart is used to connect with patients for Virtual Visits (Telemedicine).  Patients are able to view lab/test results, encounter notes, upcoming appointments, etc.  Non-urgent messages can be sent to your provider as well.   To learn more about what you can do with MyChart, go to [email or web address].    Your next appointment:   2-3 month(s)  The format for your next appointment:   In Person  Provider:   Carlyle Dolly, MD   Other Instructions  Call us if heart rate goes over 100

## 2020-12-10 DIAGNOSIS — M6281 Muscle weakness (generalized): Secondary | ICD-10-CM | POA: Diagnosis not present

## 2020-12-10 DIAGNOSIS — Z741 Need for assistance with personal care: Secondary | ICD-10-CM | POA: Diagnosis not present

## 2020-12-20 ENCOUNTER — Ambulatory Visit: Payer: Medicare Other | Admitting: Urology

## 2020-12-20 ENCOUNTER — Other Ambulatory Visit: Payer: Self-pay

## 2020-12-20 ENCOUNTER — Encounter: Payer: Self-pay | Admitting: Urology

## 2020-12-20 VITALS — BP 178/75 | HR 74

## 2020-12-20 DIAGNOSIS — N138 Other obstructive and reflux uropathy: Secondary | ICD-10-CM

## 2020-12-20 DIAGNOSIS — R972 Elevated prostate specific antigen [PSA]: Secondary | ICD-10-CM

## 2020-12-20 DIAGNOSIS — N401 Enlarged prostate with lower urinary tract symptoms: Secondary | ICD-10-CM

## 2020-12-20 DIAGNOSIS — R310 Gross hematuria: Secondary | ICD-10-CM | POA: Diagnosis not present

## 2020-12-20 DIAGNOSIS — R339 Retention of urine, unspecified: Secondary | ICD-10-CM

## 2020-12-20 MED ORDER — FINASTERIDE 5 MG PO TABS: 5.0000 mg | ORAL_TABLET | Freq: Every day | ORAL | 3 refills | Status: DC

## 2020-12-20 NOTE — Progress Notes (Signed)
Urological Symptom Review  Patient is experiencing the following symptoms: N/a   Review of Systems  Gastrointestinal (upper)  : Negative for upper GI symptoms  Gastrointestinal (lower) : Negative for lower GI symptoms  Constitutional : Negative for symptoms  Skin: Negative for skin symptoms  Eyes: Negative for eye symptoms  Ear/Nose/Throat : Negative for Ear/Nose/Throat symptoms  Hematologic/Lymphatic: Negative for Hematologic/Lymphatic symptoms  Cardiovascular : Negative for cardiovascular symptoms  Respiratory : Shortness of breath  Endocrine: Negative for endocrine symptoms  Musculoskeletal: Negative for musculoskeletal symptoms  Neurological: Negative for neurological symptoms  Psychologic: Negative for psychiatric symptoms

## 2020-12-20 NOTE — Progress Notes (Signed)
Subjective: 1. BPH with urinary obstruction   2. Urinary retention   3. Gross hematuria   4. Elevated PSA        Consult requested by Dr. Wende Neighbors.  GU Hx: Derek Foster is an 85 yo male who was previously a patient of the [place] office.  He has a history of chronic retention managed with a foley that was changed on 09/27/20.  He had hematuria last year for about 2 weeks and had cystoscopy and clot evacuation.   He has occasional hematuria.   He was in the ER on Weds for an obstructed catheter from sediment and not blood.  He had a TUNA procedure remotely.  He has been on tamsulosin and finasteride since his episode of retention last year.   On CT on 06/25/19, the foley was in the bulbar urethra.  He had urodynamics last year and had a small capacity bladder with a good detrusor contracture.   10/25/20: Derek Foster returns today for consideration of voiding trial.  He has had no more hematuria.   12/20/20: Derek Foster returns today in f/u.  He has elected to continue management with the foley.  It was last changed on 12/07/20.  He had hematuria for a day after the changed.   He remains on finasteride but is off of tamsulosin.  He had to remove the catheter because of obstruction and had severe leakage.   His PSA was elevated with Dr. Nevada Crane on 11/05/20.  It was 11.9.   It was in the 6 range previously.   His PSA was 7.75 in 2015 with a 33% f/t ratio.  ROS:  ROS  Allergies  Allergen Reactions   Indomethacin Anaphylaxis    But tolerates ibuprofen, aleve   Iodine-131 Anaphylaxis   Lidocaine-Menthol     Other reaction(s): Bradycardia    Past Medical History:  Diagnosis Date   AKI (acute kidney injury) ([facility]) 02/12/2019   CAD (coronary artery disease)    a. cath 01/27/2009 : s/p promus DES to LAD and medical managment of 70-80% mRCA   Chronic kidney disease, stage 3b ([facility])    COVID-19    Essential hypertension    GI bleeding    Hematuria    Hyperthyroidism    Hypothyroidism    Mild atherosclerosis of  carotid artery    Mild pulmonary hypertension (HCC)    Mixed hyperlipidemia    NSTEMI (non-ST elevated myocardial infarction) ([facility]) 2011   PAF (paroxysmal atrial fibrillation) ([facility])     Past Surgical History:  Procedure Laterality Date   BOWEL RESECTION N/A 06/28/2019   Procedure: SMALL BOWEL RESECTION;  Surgeon: Virl Cagey, MD;  Location: [facility];  Service: General;  Laterality: N/A;   CHOLECYSTECTOMY     CORONARY STENT PLACEMENT     CYSTOSCOPY/RETROGRADE/URETEROSCOPY Bilateral 03/09/2019   Procedure: CYSTOSCOPY, Clot Evacuation, Fulguration;  Surgeon: Festus Aloe, MD;  Location: [facility];  Service: Urology;  Laterality: Bilateral;   HERNIA REPAIR     LYSIS OF ADHESION N/A 06/28/2019   Procedure: LYSIS OF ADHESION;  Surgeon: Virl Cagey, MD;  Location: [facility];  Service: General;  Laterality: N/A;   VENTRAL HERNIA REPAIR N/A 06/28/2019   Procedure: HERNIA REPAIR VENTRAL ADULT WITH VICRYL MESH OVERLAY;  Surgeon: Virl Cagey, MD;  Location: [facility];  Service: General;  Laterality: N/A;    Social History   Socioeconomic History   Marital status: Married    Spouse name: Not on file   Number of children: Not on file  Years of education: Not on file   Highest education level: Not on file  Occupational History   Occupation: textiles  Tobacco Use   Smoking status: Former    Types: Cigarettes    Quit date: 01/20/1957    Years since quitting: 63.9   Smokeless tobacco: Never  Vaping Use   Vaping Use: Never used  Substance and Sexual Activity   Alcohol use: No   Drug use: Never   Sexual activity: Not on file  Other Topics Concern   Not on file  Social History Narrative   Quit smoking about 62 years ago.   Social Determinants of Health   Financial Resource Strain: Not on file  Food Insecurity: Not on file  Transportation Needs: Not on file  Physical Activity: Not on file  Stress: Not on file  Social Connections: Not on file  Intimate Partner Violence: Not on  file    Family History  Problem Relation Age of Onset   Heart attack Father    Lung cancer Sister     Anti-infectives: Anti-infectives (From admission, onward)    None       Current Outpatient Medications  Medication Sig Dispense Refill   aspirin EC 81 MG tablet Take 81 mg by mouth daily. Swallow whole.     carvedilol (COREG) 6.25 MG tablet Take 1.5 tablets (9.375 mg total) by mouth 2 (two) times daily. 270 tablet 3   Coenzyme Q10 200 MG capsule Take 200 mg by mouth daily.     diclofenac Sodium (VOLTAREN) 1 % GEL as needed.     finasteride (PROSCAR) 5 MG tablet Take 1 tablet (5 mg total) by mouth daily. 90 tablet 3   furosemide (LASIX) 20 MG tablet Take 2 tablets (40 mg total) by mouth daily. 60 tablet 0   gabapentin (NEURONTIN) 100 MG capsule Take 100 mg by mouth daily.     hydrALAZINE (APRESOLINE) 50 MG tablet Take 1 tablet (50 mg total) by mouth 3 (three) times daily. 270 tablet 3   methimazole (TAPAZOLE) 10 MG tablet Take 2 tablets (20 mg total) by mouth 2 (two) times daily. 120 tablet 0   Multiple Vitamin (MULTIVITAMIN) tablet Take 1 tablet by mouth daily.     nitroGLYCERIN (NITROSTAT) 0.4 MG SL tablet Place 1 tablet (0.4 mg total) under the tongue every 5 (five) minutes as needed. 25 tablet 0   NON FORMULARY Diet - NAS     potassium chloride (KLOR-CON) 10 MEQ tablet Take 1 tablet (10 mEq total) by mouth every evening. 30 tablet 0   pravastatin (PRAVACHOL) 20 MG tablet Take 1 tablet (20 mg total) by mouth every evening. 90 tablet 3   valACYclovir (VALTREX) 500 MG tablet Take 500 mg by mouth daily.     No current facility-administered medications for this visit.     Objective: Vital signs in last 24 hours: BP (!) 178/75   Pulse 74   Intake/Output from previous day: No intake/output data recorded. Intake/Output this shift: @IOTHISSHIFT @   Physical Exam Vitals reviewed.  Constitutional:      Appearance: He is obese.  Genitourinary:    Comments: AP without  lesions. NST without mass.  Prostate is 2.5+ and benign. SV non palpable.    Lab Results:  Recent Results (from the past 2160 hour(s))  TSH     Status: Abnormal   Collection Time: 10/01/20  3:01 PM  Result Value Ref Range   TSH 7.084 (H) 0.350 - 4.500 uIU/mL    Comment: Performed  by a 3rd Generation assay with a functional sensitivity of <=0.01 uIU/mL. Performed at [facility], [address] [place], [place] [address]   T4, free     Status: None   Collection Time: 10/01/20  3:02 PM  Result Value Ref Range   Free T4 0.80 0.61 - 1.12 ng/dL    Comment: (NOTE) Biotin ingestion may interfere with free T4 tests. If the results are inconsistent with the TSH level, previous test results, or the clinical presentation, then consider biotin interference. If needed, order repeat testing after stopping biotin. Performed at [facility], [place] [address], [place], [place] [address]   Comprehensive Metabolic Panel (CMET)     Status: Abnormal   Collection Time: 10/01/20  3:02 PM  Result Value Ref Range   Sodium 141 135 - 145 mmol/L   Potassium 4.2 3.5 - 5.1 mmol/L   Chloride 107 98 - 111 mmol/L   CO2 27 22 - 32 mmol/L   Glucose, Bld 104 (H) 70 - 99 mg/dL    Comment: Glucose reference range applies only to samples taken after fasting for at least 8 hours.   BUN 25 (H) 8 - 23 mg/dL   Creatinine, Ser 1.56 (H) 0.61 - 1.24 mg/dL   Calcium 8.6 (L) 8.9 - 10.3 mg/dL   Total Protein 6.8 6.5 - 8.1 g/dL   Albumin 3.7 3.5 - 5.0 g/dL   AST 16 15 - 41 U/L   ALT 15 0 - 44 U/L   Alkaline Phosphatase 96 38 - 126 U/L   Total Bilirubin 0.6 0.3 - 1.2 mg/dL   GFR, Estimated 44 (L) >60 mL/min    Comment: (NOTE) Calculated using the CKD-EPI Creatinine Equation (2021)    Anion gap 7 5 - 15    Comment: Performed at [facility], [address] [place], [place] [address]  CBC     Status: Abnormal   Collection Time: 10/01/20  3:02 PM  Result Value Ref Range   WBC 8.6 4.0 - 10.5 K/uL   RBC 3.90 (L)  4.22 - 5.81 MIL/uL   Hemoglobin 12.0 (L) 13.0 - 17.0 g/dL   HCT 37.6 (L) 39.0 - 52.0 %   MCV 96.4 80.0 - 100.0 fL   MCH 30.8 26.0 - 34.0 pg   MCHC 31.9 30.0 - 36.0 g/dL   RDW 14.0 11.5 - 15.5 %   Platelets 200 150 - 400 K/uL   nRBC 0.0 0.0 - 0.2 %    Comment: Performed at [facility], [address] [place], [place] [address]  Basic metabolic panel     Status: Abnormal   Collection Time: 11/13/20  3:12 PM  Result Value Ref Range   Sodium 143 135 - 145 mmol/L   Potassium 4.2 3.5 - 5.1 mmol/L   Chloride 108 98 - 111 mmol/L   CO2 27 22 - 32 mmol/L   Glucose, Bld 103 (H) 70 - 99 mg/dL    Comment: Glucose reference range applies only to samples taken after fasting for at least 8 hours.   BUN 34 (H) 8 - 23 mg/dL   Creatinine, Ser 1.81 (H) 0.61 - 1.24 mg/dL   Calcium 8.4 (L) 8.9 - 10.3 mg/dL   GFR, Estimated 37 (L) >60 mL/min    Comment: (NOTE) Calculated using the CKD-EPI Creatinine Equation (2021)    Anion gap 8 5 - 15    Comment: Performed at [facility], [address] [place], [place] [address]     BMET No  results for input(s): NA, K, CL, CO2, GLUCOSE, BUN, CREATININE, CALCIUM in the last 72 hours. PT/INR No results for input(s): LABPROT, INR in the last 72 hours. ABG No results for input(s): PHART, HCO3 in the last 72 hours.  Invalid input(s): PCO2, PO2  Studies/Results:   Assessment/Plan: BPH with BOO and retention.   He has elected to continue long term foley management.    Hematuria.  He had some bleeding after the last catheter change.  I will have him stay on the finasteride.  Elevated PSA.  Chronic worsening.  The level was done after the tube was changed.   I will recheck and if it is back down, I don't think he needs further testing since he is not a candidate for local prostate cancer therapy.   Meds ordered this encounter  Medications   finasteride (PROSCAR) 5 MG tablet    Sig: Take 1 tablet (5 mg total) by mouth daily.    Dispense:  90 tablet     Refill:  3      Orders Placed This Encounter  Procedures   PSA, total and free      Return in about 6 months (around 06/20/2021).    CC: Dr. Wende Neighbors and Dr. Kerry Hough.      Derek Foster 12/20/2020 [phone] Patient ID: Derek Foster, male   DOB: [birth year], 85 y.o.   MRN: [id number] Patient ID: Derek Foster, male   DOB: 11-08-1935, 85 y.o.   MRN: [id number]

## 2020-12-21 ENCOUNTER — Other Ambulatory Visit: Payer: Self-pay

## 2020-12-21 ENCOUNTER — Telehealth: Payer: Self-pay

## 2020-12-21 DIAGNOSIS — R972 Elevated prostate specific antigen [PSA]: Secondary | ICD-10-CM

## 2020-12-21 LAB — PSA, TOTAL AND FREE
PSA, Free Pct: 26 %
PSA, Free: 3.28 ng/mL
Prostate Specific Ag, Serum: 12.6 ng/mL — ABNORMAL HIGH (ref 0.0–4.0)

## 2020-12-21 NOTE — Telephone Encounter (Signed)
-----   Message from Irine Seal, MD sent at 12/21/2020  9:20 AM EST ----- His PSA is actually up a little more.   I would like to see how this responds to the finasteride and would like to get a repeat PSA in 6 weeks months instead of waiting 6 months.  If the PSA doesn't start to fall on the finasteride, we will need to consider further evaluation.  ----- Message ----- From: Dorisann Frames, RN Sent: 12/21/2020   8:25 AM EST To: Irine Seal, MD  Please review

## 2020-12-21 NOTE — Telephone Encounter (Signed)
Patient called and notified. Lab appt scheduled with patient. Patient voiced understanding.

## 2020-12-27 DIAGNOSIS — E782 Mixed hyperlipidemia: Secondary | ICD-10-CM | POA: Diagnosis not present

## 2020-12-27 DIAGNOSIS — R7301 Impaired fasting glucose: Secondary | ICD-10-CM | POA: Diagnosis not present

## 2020-12-27 DIAGNOSIS — D631 Anemia in chronic kidney disease: Secondary | ICD-10-CM | POA: Diagnosis not present

## 2020-12-27 DIAGNOSIS — N189 Chronic kidney disease, unspecified: Secondary | ICD-10-CM | POA: Diagnosis not present

## 2020-12-27 DIAGNOSIS — E039 Hypothyroidism, unspecified: Secondary | ICD-10-CM | POA: Diagnosis not present

## 2020-12-27 DIAGNOSIS — N319 Neuromuscular dysfunction of bladder, unspecified: Secondary | ICD-10-CM | POA: Diagnosis not present

## 2020-12-27 DIAGNOSIS — I480 Paroxysmal atrial fibrillation: Secondary | ICD-10-CM | POA: Diagnosis not present

## 2020-12-27 DIAGNOSIS — I482 Chronic atrial fibrillation, unspecified: Secondary | ICD-10-CM | POA: Diagnosis not present

## 2020-12-27 DIAGNOSIS — E876 Hypokalemia: Secondary | ICD-10-CM | POA: Diagnosis not present

## 2020-12-27 DIAGNOSIS — I509 Heart failure, unspecified: Secondary | ICD-10-CM | POA: Diagnosis not present

## 2020-12-27 DIAGNOSIS — R339 Retention of urine, unspecified: Secondary | ICD-10-CM | POA: Diagnosis not present

## 2020-12-27 DIAGNOSIS — I130 Hypertensive heart and chronic kidney disease with heart failure and stage 1 through stage 4 chronic kidney disease, or unspecified chronic kidney disease: Secondary | ICD-10-CM | POA: Diagnosis not present

## 2020-12-27 DIAGNOSIS — E042 Nontoxic multinodular goiter: Secondary | ICD-10-CM | POA: Diagnosis not present

## 2020-12-27 DIAGNOSIS — Z7982 Long term (current) use of aspirin: Secondary | ICD-10-CM | POA: Diagnosis not present

## 2020-12-27 DIAGNOSIS — R5382 Chronic fatigue, unspecified: Secondary | ICD-10-CM | POA: Diagnosis not present

## 2020-12-27 DIAGNOSIS — I2510 Atherosclerotic heart disease of native coronary artery without angina pectoris: Secondary | ICD-10-CM | POA: Diagnosis not present

## 2020-12-31 IMAGING — DX DG HIP (WITH OR WITHOUT PELVIS) 2-3V*R*
3 series · 3 of 3 positions shown · non-contrast
Comparison: None.

CLINICAL DATA: Fall with hip pain

EXAM:
DG HIP (WITH OR WITHOUT PELVIS) 2-3V RIGHT

[hip ap]
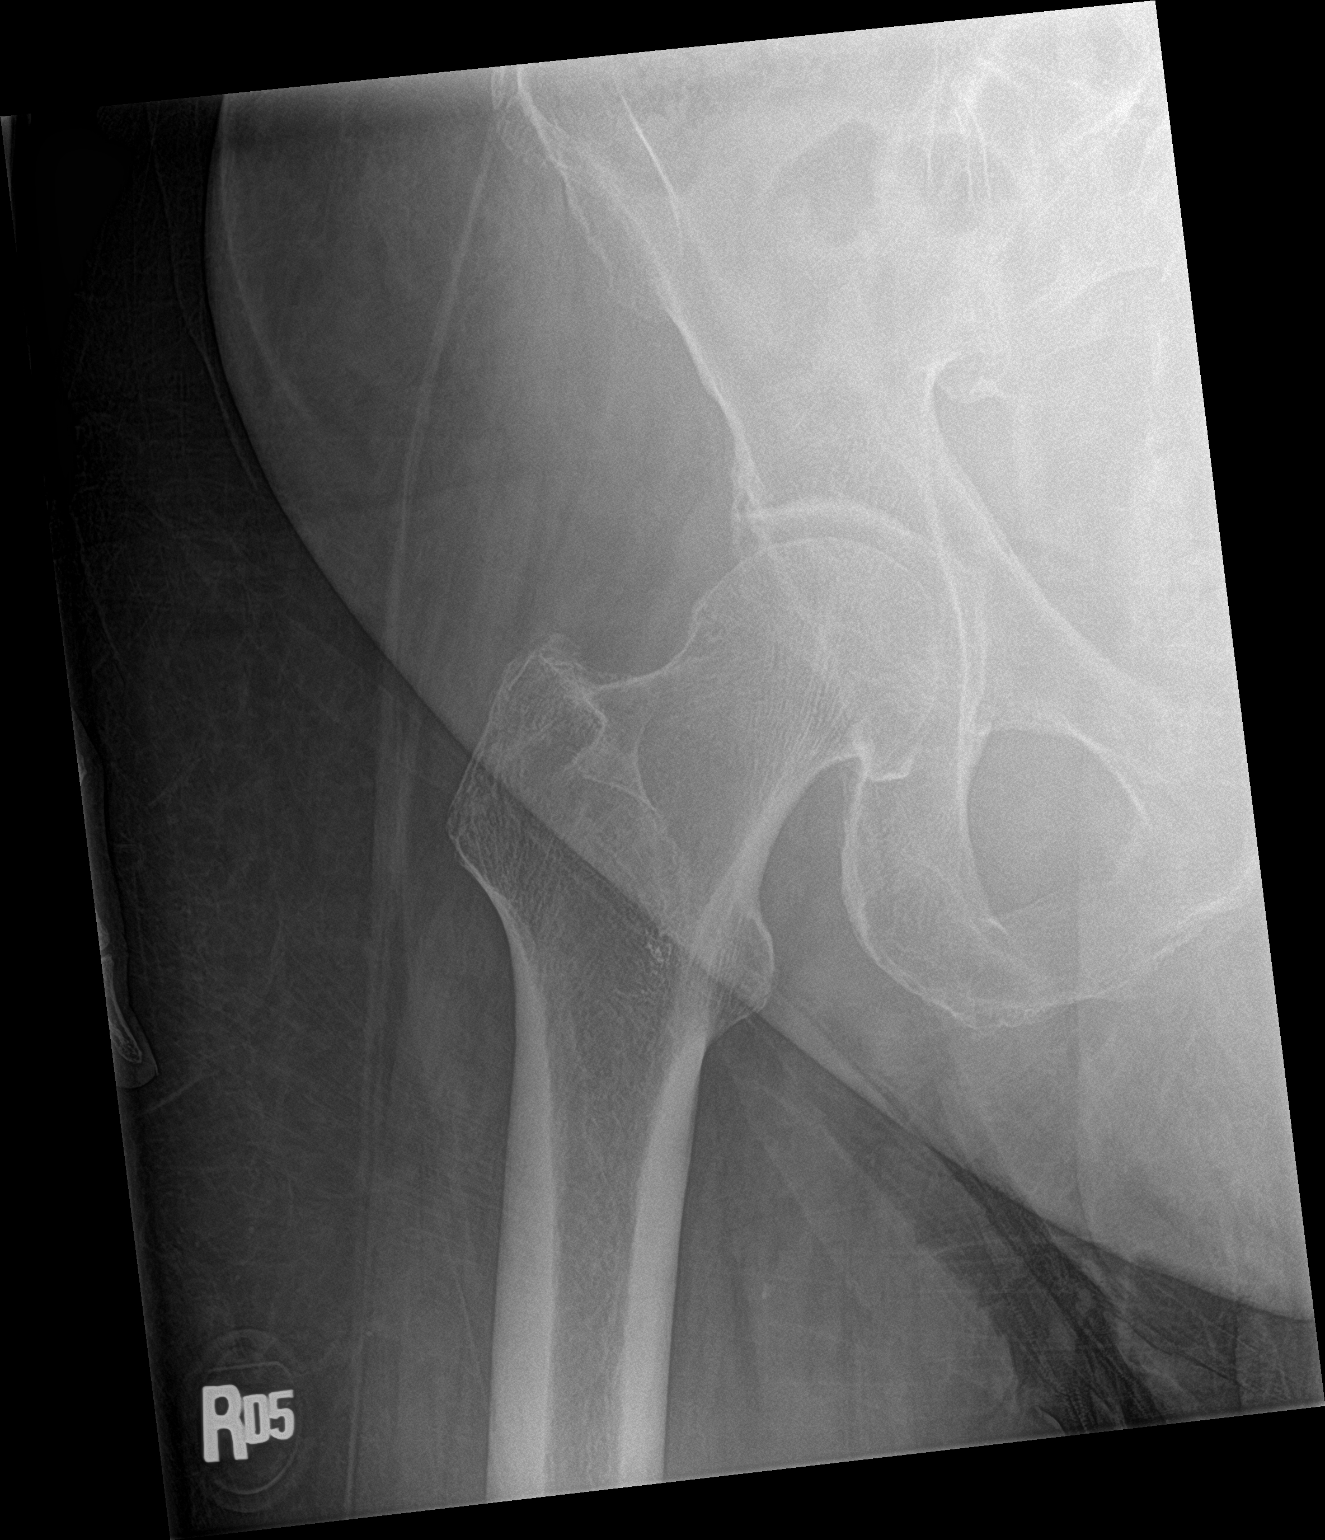

[hip lat]
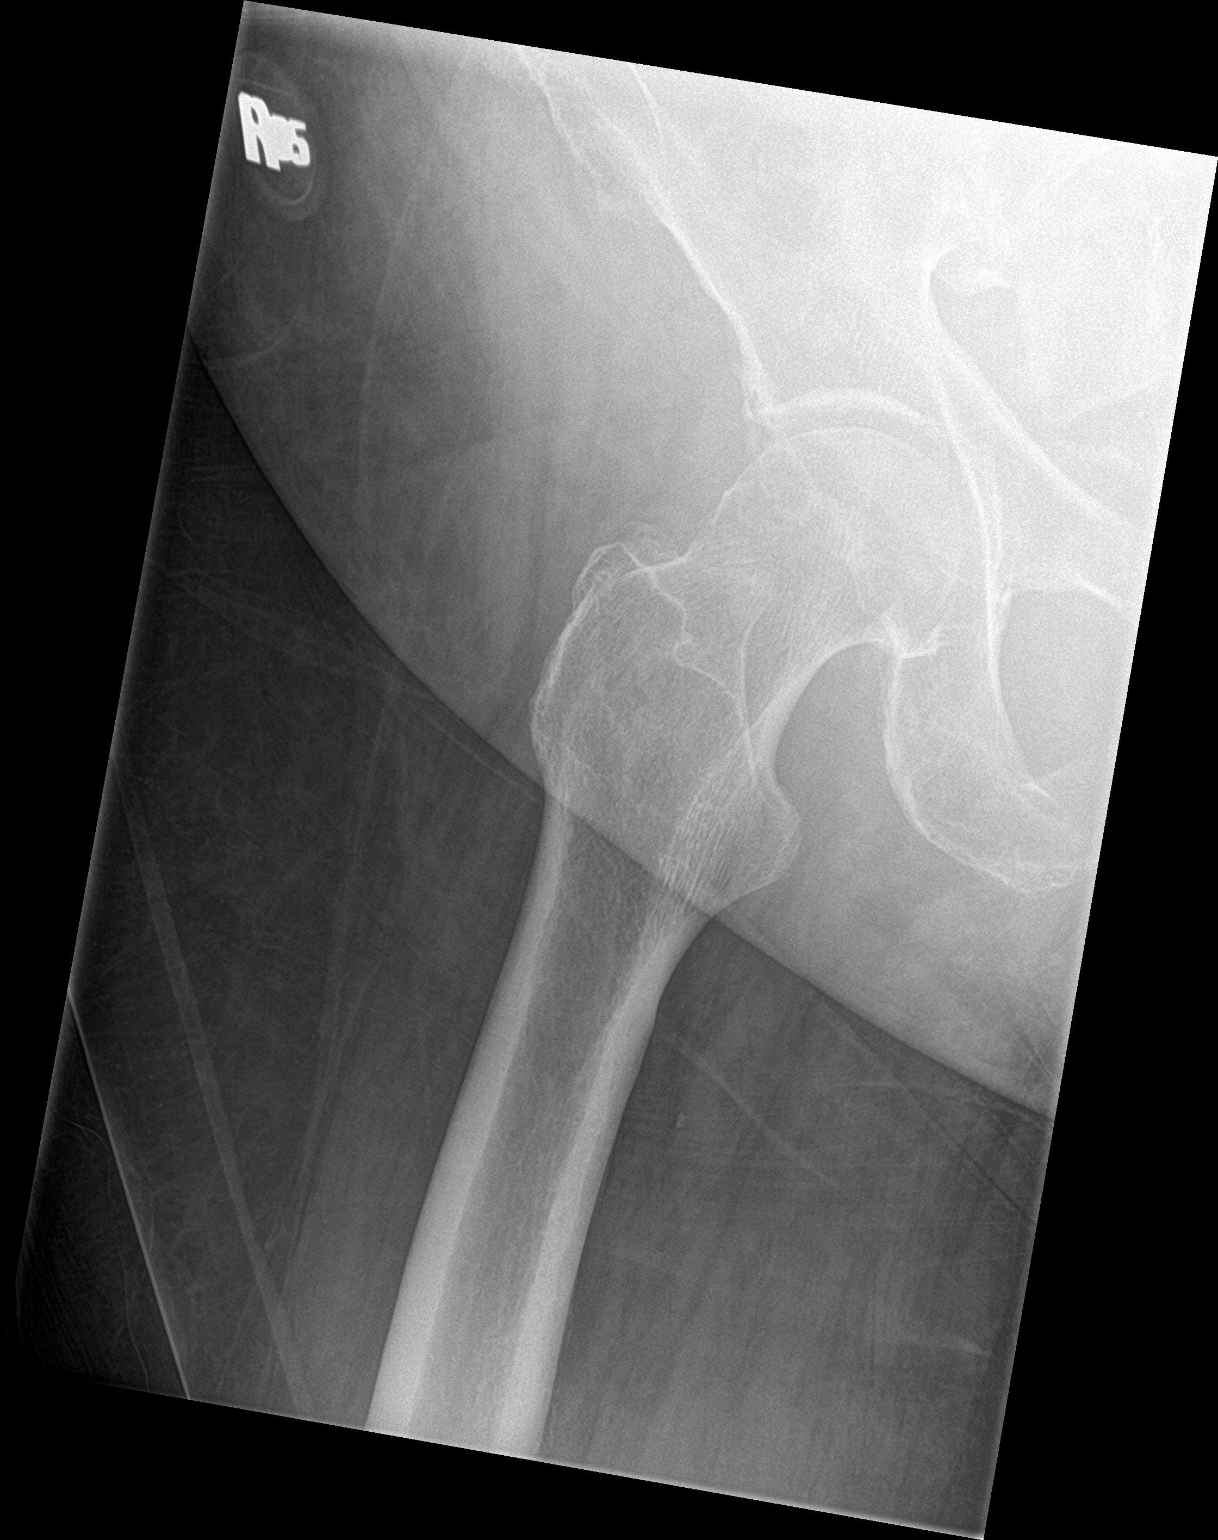

[pelvis ap]
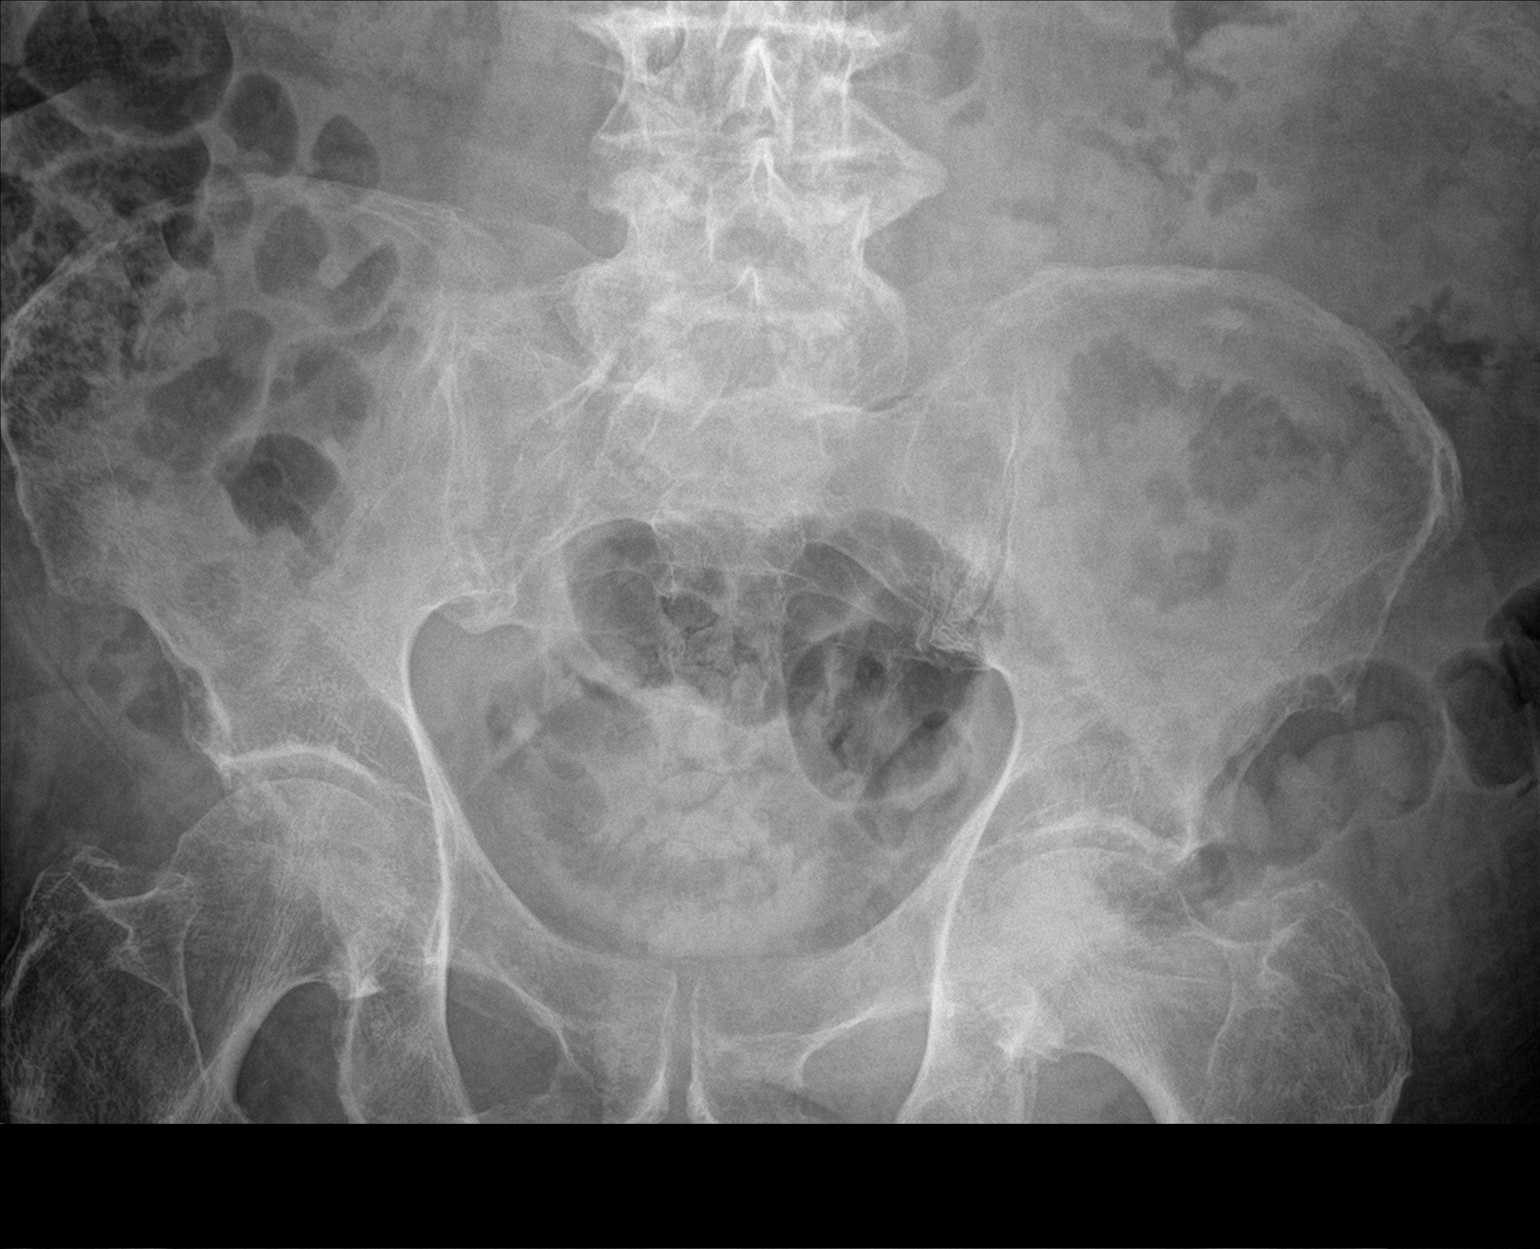

[3 of 3 positions shown; findings below may reference images not displayed]

FINDINGS: No definite acute displaced fracture or malalignment. SI joint
degenerative change. Pubic symphysis and rami appear intact.
IMPRESSION: No acute osseous abnormality.

## 2020-12-31 IMAGING — CT CT CERVICAL SPINE W/O CM
4 of 5 series · 16 of 33 positions shown, 18 images · non-contrast
Comparison: Thyroid ultrasound 04/21/2016.

CLINICAL DATA: Fall

EXAM:
CT CERVICAL SPINE WITHOUT CONTRAST
TECHNIQUE: Multidetector CT imaging of the cervical spine was performed without
intravenous contrast. Multiplanar CT image reconstructions were also
generated.

[Series 4: c spine soft · axial · 0.46mm/px · z∈[+1234,+1372]mm · 6 of 97 slices shown]
[im 14/97  soft-tissue]
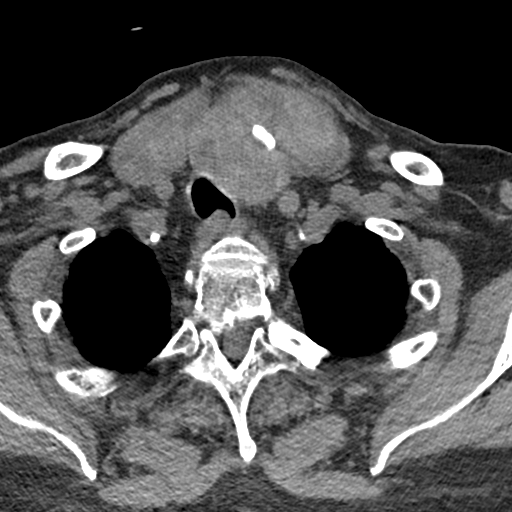
[im 28/97  soft-tissue]
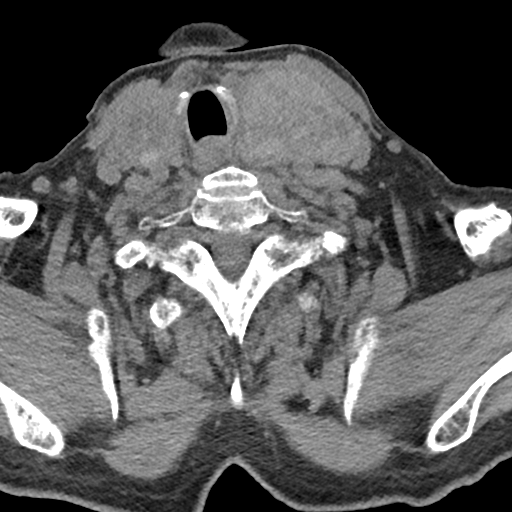
[im 42/97  soft-tissue]
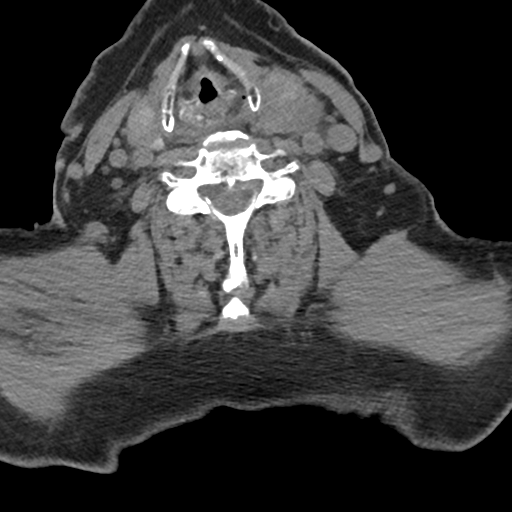
[im 55/97  soft-tissue]
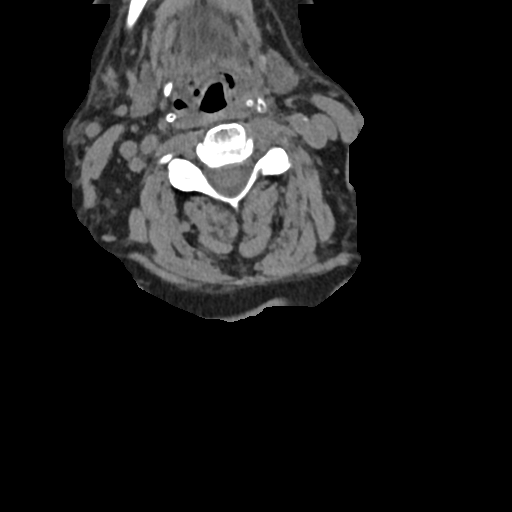
[im 69/97  soft-tissue]
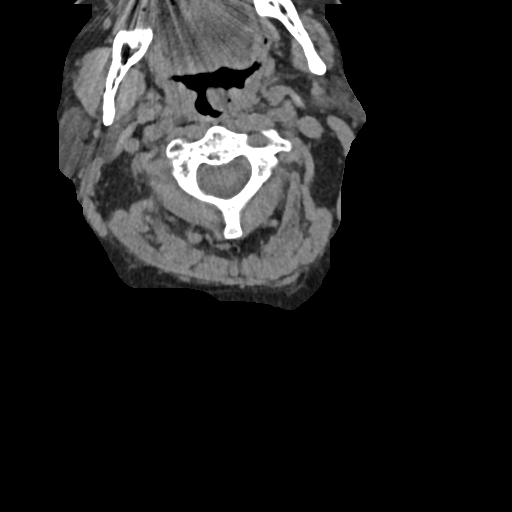
[im 83/97  soft-tissue]
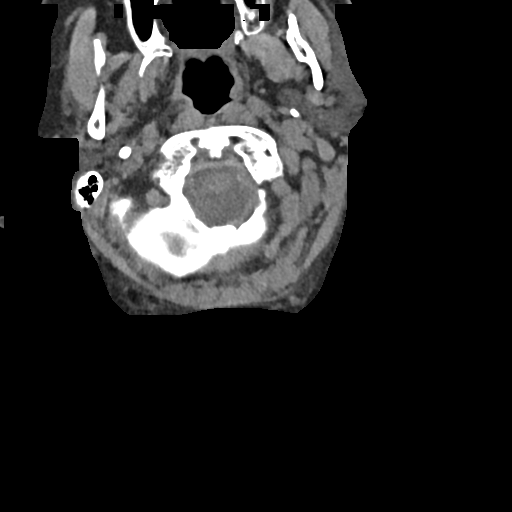

[Series 6: coronal bone · coronal · 0.38mm/px · 1 of 96 slices shown]
[im 48/96  bone]
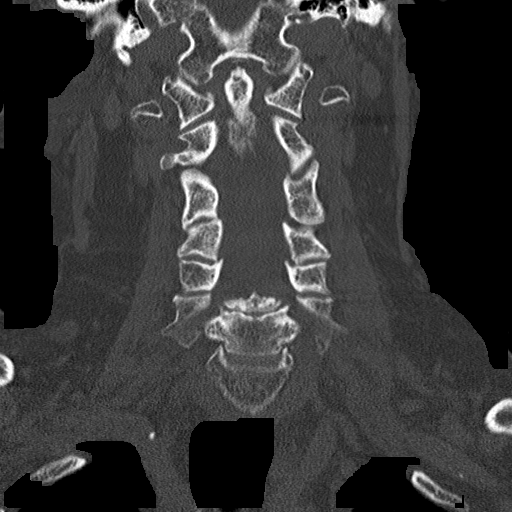

[Series 7: orthogonal axials · axial · 0.21mm/px · z∈[+1242,+1334]mm · 4 of 82 slices shown, 5 images]
[im 17/82  soft-tissue]
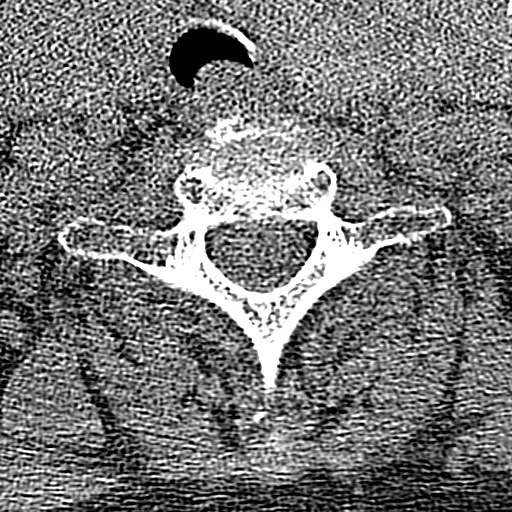
[im 17/82  bone]
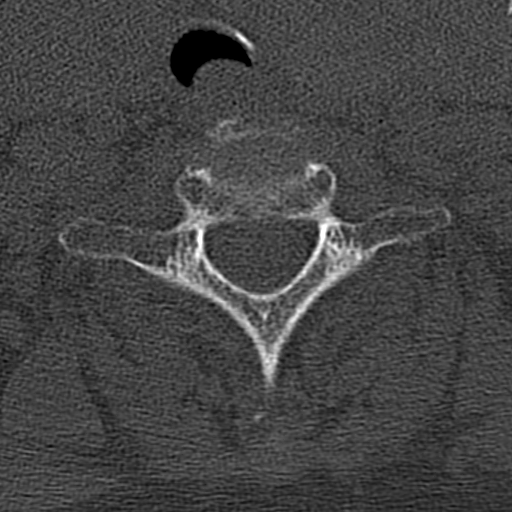
[im 33/82  bone]
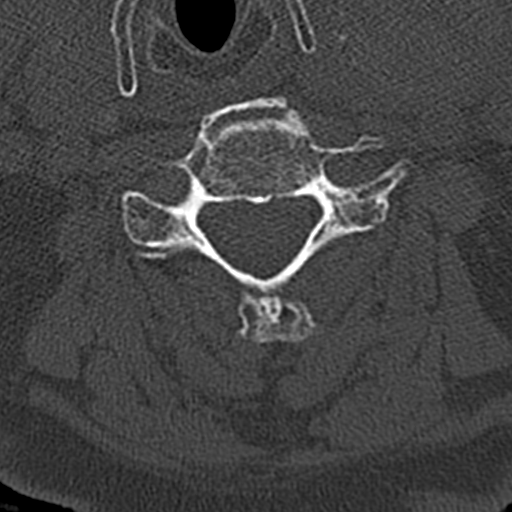
[im 49/82  bone]
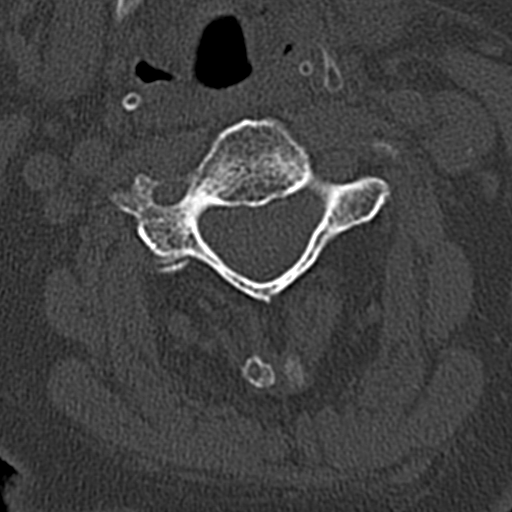
[im 65/82  bone]
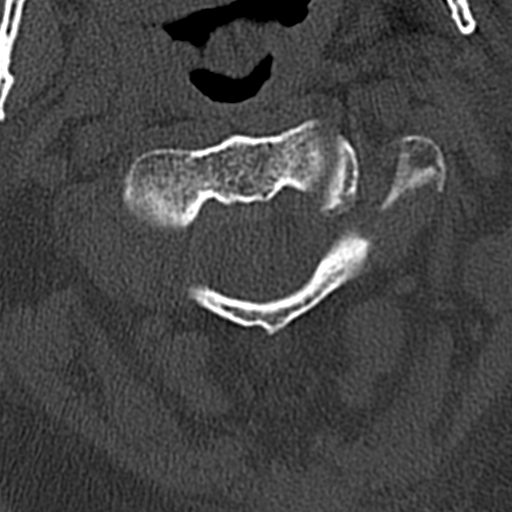

[Series 8: sagittal tissue · sagittal · 0.38mm/px · 5 of 71 slices shown, 6 images]
[im 24/71  bone]
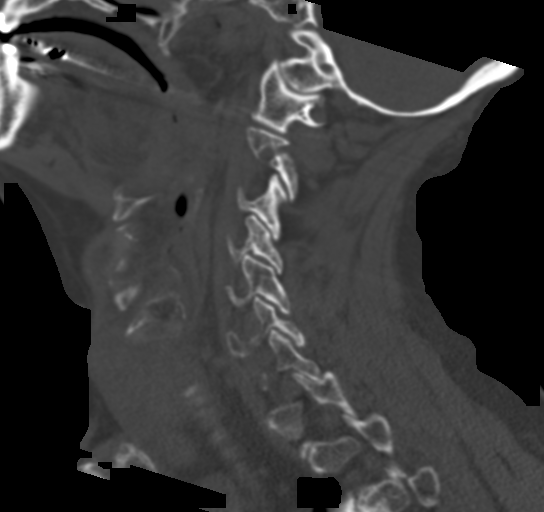
[im 30/71  bone]
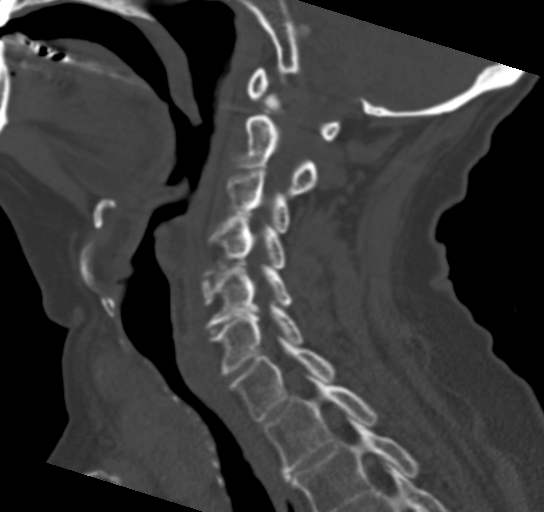
[im 36/71  soft-tissue]
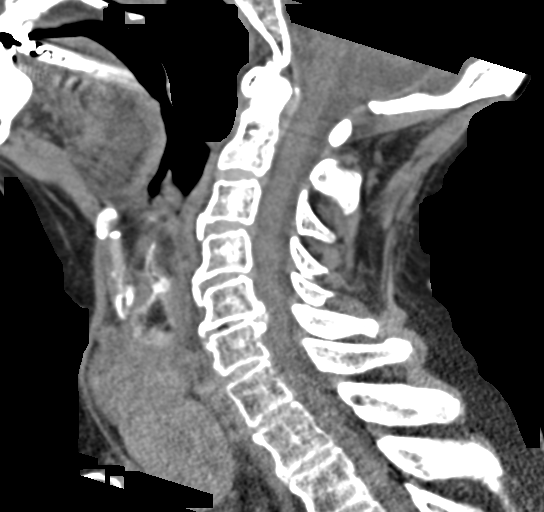
[im 36/71  bone]
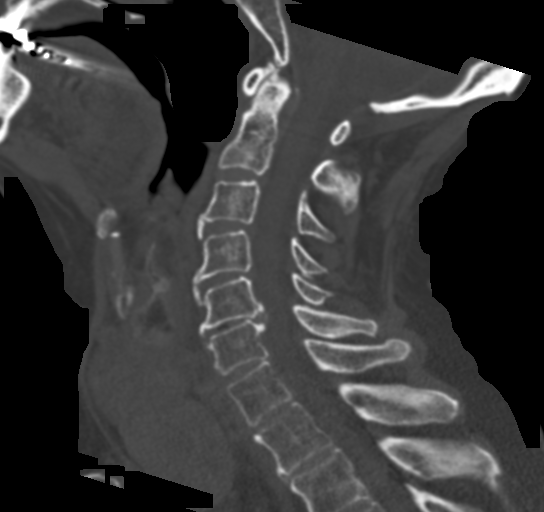
[im 41/71  bone]
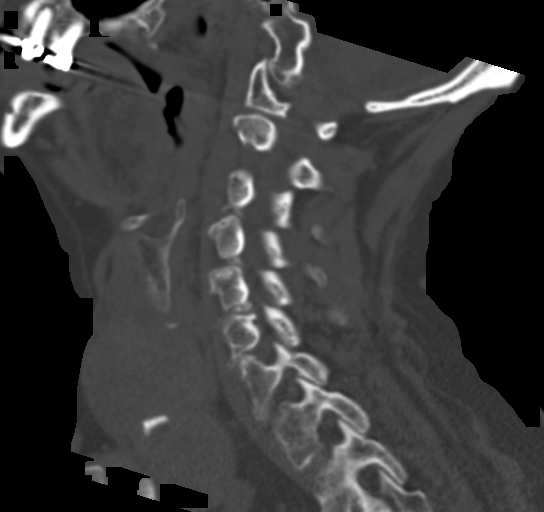
[im 47/71  bone]
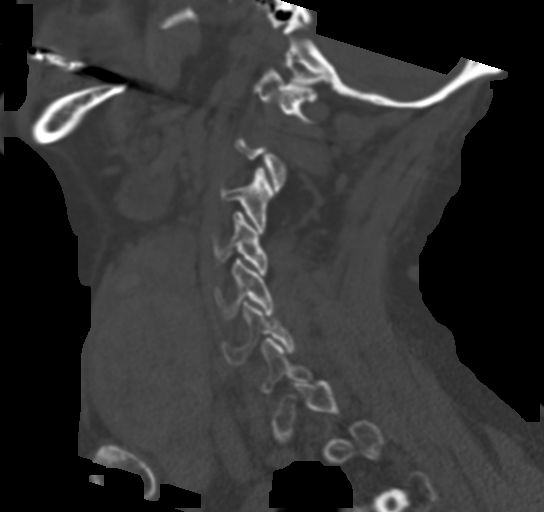

[16 of 33 positions shown; findings below may reference images not displayed]

FINDINGS: Alignment: No subluxation.  Facet alignment is maintained

Skull base and vertebrae: No acute fracture. No primary bone lesion
or focal pathologic process.

Soft tissues and spinal canal: No prevertebral fluid or swelling. No
visible canal hematoma.

Disc levels: Diffuse degenerative changes, most advanced at C4-C5
and C5-C6. Posterior disc osteophyte at C5-C6 with mild mass effect
on the thecal sac. Facet degenerative change at multiple levels.

Upper chest: Diffusely enlarged thyroid gland with multiple masses.
This has been previously evaluated with thyroid ultrasound and
biopsy, no further workup recommended based on CT. Correlation with
the previous thyroid ultrasound and biopsy reports is advised.

Other: None
IMPRESSION: No acute osseous abnormality. Degenerative changes of the cervical
spine.

## 2021-01-07 DIAGNOSIS — R109 Unspecified abdominal pain: Secondary | ICD-10-CM | POA: Diagnosis not present

## 2021-01-22 ENCOUNTER — Other Ambulatory Visit: Payer: Medicare Other

## 2021-01-22 ENCOUNTER — Other Ambulatory Visit: Payer: Self-pay

## 2021-01-24 ENCOUNTER — Ambulatory Visit: Payer: Medicare Other | Admitting: Cardiology

## 2021-01-28 DIAGNOSIS — E876 Hypokalemia: Secondary | ICD-10-CM | POA: Diagnosis not present

## 2021-01-28 DIAGNOSIS — I509 Heart failure, unspecified: Secondary | ICD-10-CM | POA: Diagnosis not present

## 2021-01-28 DIAGNOSIS — I130 Hypertensive heart and chronic kidney disease with heart failure and stage 1 through stage 4 chronic kidney disease, or unspecified chronic kidney disease: Secondary | ICD-10-CM | POA: Diagnosis not present

## 2021-01-28 DIAGNOSIS — R972 Elevated prostate specific antigen [PSA]: Secondary | ICD-10-CM | POA: Diagnosis not present

## 2021-01-28 DIAGNOSIS — D649 Anemia, unspecified: Secondary | ICD-10-CM | POA: Diagnosis not present

## 2021-01-28 DIAGNOSIS — E782 Mixed hyperlipidemia: Secondary | ICD-10-CM | POA: Diagnosis not present

## 2021-01-28 DIAGNOSIS — Z7982 Long term (current) use of aspirin: Secondary | ICD-10-CM | POA: Diagnosis not present

## 2021-01-28 DIAGNOSIS — D631 Anemia in chronic kidney disease: Secondary | ICD-10-CM | POA: Diagnosis not present

## 2021-01-28 DIAGNOSIS — I10 Essential (primary) hypertension: Secondary | ICD-10-CM | POA: Diagnosis not present

## 2021-01-28 DIAGNOSIS — R5382 Chronic fatigue, unspecified: Secondary | ICD-10-CM | POA: Diagnosis not present

## 2021-01-28 DIAGNOSIS — I5032 Chronic diastolic (congestive) heart failure: Secondary | ICD-10-CM | POA: Diagnosis not present

## 2021-01-28 DIAGNOSIS — R7301 Impaired fasting glucose: Secondary | ICD-10-CM | POA: Diagnosis not present

## 2021-01-28 DIAGNOSIS — E039 Hypothyroidism, unspecified: Secondary | ICD-10-CM | POA: Diagnosis not present

## 2021-01-28 DIAGNOSIS — E042 Nontoxic multinodular goiter: Secondary | ICD-10-CM | POA: Diagnosis not present

## 2021-01-28 DIAGNOSIS — N189 Chronic kidney disease, unspecified: Secondary | ICD-10-CM | POA: Diagnosis not present

## 2021-01-28 DIAGNOSIS — I2510 Atherosclerotic heart disease of native coronary artery without angina pectoris: Secondary | ICD-10-CM | POA: Diagnosis not present

## 2021-01-28 DIAGNOSIS — I482 Chronic atrial fibrillation, unspecified: Secondary | ICD-10-CM | POA: Diagnosis not present

## 2021-01-28 DIAGNOSIS — R339 Retention of urine, unspecified: Secondary | ICD-10-CM | POA: Diagnosis not present

## 2021-01-28 DIAGNOSIS — I480 Paroxysmal atrial fibrillation: Secondary | ICD-10-CM | POA: Diagnosis not present

## 2021-01-28 DIAGNOSIS — N319 Neuromuscular dysfunction of bladder, unspecified: Secondary | ICD-10-CM | POA: Diagnosis not present

## 2021-01-29 ENCOUNTER — Other Ambulatory Visit: Payer: Medicare Other

## 2021-02-07 ENCOUNTER — Other Ambulatory Visit: Payer: Self-pay

## 2021-03-01 ENCOUNTER — Ambulatory Visit: Payer: Medicare Other | Admitting: Cardiology

## 2021-04-12 DIAGNOSIS — N390 Urinary tract infection, site not specified: Secondary | ICD-10-CM | POA: Diagnosis not present

## 2021-04-12 DIAGNOSIS — R600 Localized edema: Secondary | ICD-10-CM | POA: Diagnosis not present

## 2021-04-12 DIAGNOSIS — M6281 Muscle weakness (generalized): Secondary | ICD-10-CM | POA: Diagnosis not present

## 2021-04-12 DIAGNOSIS — R269 Unspecified abnormalities of gait and mobility: Secondary | ICD-10-CM | POA: Diagnosis not present

## 2021-04-12 DIAGNOSIS — L89159 Pressure ulcer of sacral region, unspecified stage: Secondary | ICD-10-CM | POA: Diagnosis not present

## 2021-04-21 DIAGNOSIS — R7301 Impaired fasting glucose: Secondary | ICD-10-CM | POA: Diagnosis not present

## 2021-04-21 DIAGNOSIS — N390 Urinary tract infection, site not specified: Secondary | ICD-10-CM | POA: Diagnosis not present

## 2021-04-21 DIAGNOSIS — R338 Other retention of urine: Secondary | ICD-10-CM | POA: Diagnosis not present

## 2021-04-21 DIAGNOSIS — N319 Neuromuscular dysfunction of bladder, unspecified: Secondary | ICD-10-CM | POA: Diagnosis not present

## 2021-04-21 DIAGNOSIS — E871 Hypo-osmolality and hyponatremia: Secondary | ICD-10-CM | POA: Diagnosis not present

## 2021-04-21 DIAGNOSIS — N401 Enlarged prostate with lower urinary tract symptoms: Secondary | ICD-10-CM | POA: Diagnosis not present

## 2021-04-21 DIAGNOSIS — I2510 Atherosclerotic heart disease of native coronary artery without angina pectoris: Secondary | ICD-10-CM | POA: Diagnosis not present

## 2021-04-21 DIAGNOSIS — E039 Hypothyroidism, unspecified: Secondary | ICD-10-CM | POA: Diagnosis not present

## 2021-04-21 DIAGNOSIS — I482 Chronic atrial fibrillation, unspecified: Secondary | ICD-10-CM | POA: Diagnosis not present

## 2021-04-21 DIAGNOSIS — I130 Hypertensive heart and chronic kidney disease with heart failure and stage 1 through stage 4 chronic kidney disease, or unspecified chronic kidney disease: Secondary | ICD-10-CM | POA: Diagnosis not present

## 2021-04-21 DIAGNOSIS — D631 Anemia in chronic kidney disease: Secondary | ICD-10-CM | POA: Diagnosis not present

## 2021-04-21 DIAGNOSIS — L89322 Pressure ulcer of left buttock, stage 2: Secondary | ICD-10-CM | POA: Diagnosis not present

## 2021-04-21 DIAGNOSIS — N189 Chronic kidney disease, unspecified: Secondary | ICD-10-CM | POA: Diagnosis not present

## 2021-04-21 DIAGNOSIS — E042 Nontoxic multinodular goiter: Secondary | ICD-10-CM | POA: Diagnosis not present

## 2021-04-21 DIAGNOSIS — L89312 Pressure ulcer of right buttock, stage 2: Secondary | ICD-10-CM | POA: Diagnosis not present

## 2021-04-21 DIAGNOSIS — I5032 Chronic diastolic (congestive) heart failure: Secondary | ICD-10-CM | POA: Diagnosis not present

## 2021-04-25 ENCOUNTER — Ambulatory Visit: Payer: Medicare Other | Admitting: Urology

## 2021-05-06 ENCOUNTER — Inpatient Hospital Stay (HOSPITAL_COMMUNITY)
Admission: EM | Admit: 2021-05-06 | Discharge: 2021-05-09 | DRG: 699 | Disposition: A | Discharge status: Skilled Nursing Facility | Payer: Medicare Other | Attending: Internal Medicine | Admitting: Internal Medicine

## 2021-05-06 ENCOUNTER — Encounter (HOSPITAL_COMMUNITY): Payer: Self-pay

## 2021-05-06 ENCOUNTER — Emergency Department (HOSPITAL_COMMUNITY): Payer: Medicare Other

## 2021-05-06 ENCOUNTER — Other Ambulatory Visit: Payer: Self-pay

## 2021-05-06 DIAGNOSIS — I252 Old myocardial infarction: Secondary | ICD-10-CM

## 2021-05-06 DIAGNOSIS — E0590 Thyrotoxicosis, unspecified without thyrotoxic crisis or storm: Secondary | ICD-10-CM | POA: Diagnosis present

## 2021-05-06 DIAGNOSIS — D649 Anemia, unspecified: Secondary | ICD-10-CM

## 2021-05-06 DIAGNOSIS — Z801 Family history of malignant neoplasm of trachea, bronchus and lung: Secondary | ICD-10-CM

## 2021-05-06 DIAGNOSIS — N3001 Acute cystitis with hematuria: Secondary | ICD-10-CM | POA: Diagnosis not present

## 2021-05-06 DIAGNOSIS — N179 Acute kidney failure, unspecified: Secondary | ICD-10-CM | POA: Diagnosis present

## 2021-05-06 DIAGNOSIS — Z8249 Family history of ischemic heart disease and other diseases of the circulatory system: Secondary | ICD-10-CM

## 2021-05-06 DIAGNOSIS — E039 Hypothyroidism, unspecified: Secondary | ICD-10-CM | POA: Diagnosis not present

## 2021-05-06 DIAGNOSIS — R5381 Other malaise: Secondary | ICD-10-CM | POA: Diagnosis not present

## 2021-05-06 DIAGNOSIS — Z20822 Contact with and (suspected) exposure to covid-19: Secondary | ICD-10-CM | POA: Diagnosis present

## 2021-05-06 DIAGNOSIS — N3000 Acute cystitis without hematuria: Secondary | ICD-10-CM | POA: Diagnosis not present

## 2021-05-06 DIAGNOSIS — L89322 Pressure ulcer of left buttock, stage 2: Secondary | ICD-10-CM | POA: Diagnosis present

## 2021-05-06 DIAGNOSIS — I10 Essential (primary) hypertension: Secondary | ICD-10-CM | POA: Diagnosis not present

## 2021-05-06 DIAGNOSIS — Z7982 Long term (current) use of aspirin: Secondary | ICD-10-CM

## 2021-05-06 DIAGNOSIS — N1832 Chronic kidney disease, stage 3b: Secondary | ICD-10-CM

## 2021-05-06 DIAGNOSIS — Z66 Do not resuscitate: Secondary | ICD-10-CM | POA: Diagnosis not present

## 2021-05-06 DIAGNOSIS — Y846 Urinary catheterization as the cause of abnormal reaction of the patient, or of later complication, without mention of misadventure at the time of the procedure: Secondary | ICD-10-CM | POA: Diagnosis not present

## 2021-05-06 DIAGNOSIS — T83518A Infection and inflammatory reaction due to other urinary catheter, initial encounter: Secondary | ICD-10-CM | POA: Diagnosis not present

## 2021-05-06 DIAGNOSIS — R531 Weakness: Secondary | ICD-10-CM

## 2021-05-06 DIAGNOSIS — Z9049 Acquired absence of other specified parts of digestive tract: Secondary | ICD-10-CM

## 2021-05-06 DIAGNOSIS — Z79899 Other long term (current) drug therapy: Secondary | ICD-10-CM | POA: Diagnosis not present

## 2021-05-06 DIAGNOSIS — I2510 Atherosclerotic heart disease of native coronary artery without angina pectoris: Secondary | ICD-10-CM | POA: Diagnosis not present

## 2021-05-06 DIAGNOSIS — R600 Localized edema: Secondary | ICD-10-CM | POA: Diagnosis present

## 2021-05-06 DIAGNOSIS — Z87891 Personal history of nicotine dependence: Secondary | ICD-10-CM | POA: Diagnosis not present

## 2021-05-06 DIAGNOSIS — I272 Pulmonary hypertension, unspecified: Secondary | ICD-10-CM | POA: Diagnosis not present

## 2021-05-06 DIAGNOSIS — N390 Urinary tract infection, site not specified: Secondary | ICD-10-CM | POA: Diagnosis present

## 2021-05-06 DIAGNOSIS — Z6841 Body Mass Index (BMI) 40.0 and over, adult: Secondary | ICD-10-CM

## 2021-05-06 DIAGNOSIS — I480 Paroxysmal atrial fibrillation: Secondary | ICD-10-CM | POA: Diagnosis not present

## 2021-05-06 DIAGNOSIS — E782 Mixed hyperlipidemia: Secondary | ICD-10-CM | POA: Diagnosis present

## 2021-05-06 DIAGNOSIS — E43 Unspecified severe protein-calorie malnutrition: Secondary | ICD-10-CM | POA: Diagnosis present

## 2021-05-06 DIAGNOSIS — R319 Hematuria, unspecified: Secondary | ICD-10-CM

## 2021-05-06 DIAGNOSIS — I129 Hypertensive chronic kidney disease with stage 1 through stage 4 chronic kidney disease, or unspecified chronic kidney disease: Secondary | ICD-10-CM | POA: Diagnosis present

## 2021-05-06 DIAGNOSIS — E66813 Obesity, class 3: Secondary | ICD-10-CM

## 2021-05-06 LAB — URINALYSIS, ROUTINE W REFLEX MICROSCOPIC
Bilirubin Urine: NEGATIVE
Glucose, UA: NEGATIVE mg/dL
Ketones, ur: NEGATIVE mg/dL
Nitrite: NEGATIVE
Protein, ur: 100 mg/dL — AB
RBC / HPF: >50 RBC/hpf — ABNORMAL HIGH (ref 0–5)
Specific Gravity, Urine: 1.013 (ref 1.005–1.030)
WBC, UA: >50 WBC/hpf — ABNORMAL HIGH (ref 0–5)
pH: 6 (ref 5.0–8.0)

## 2021-05-06 LAB — CBC
HCT: 29.2 % — ABNORMAL LOW (ref 39.0–52.0)
Hemoglobin: 9.4 g/dL — ABNORMAL LOW (ref 13.0–17.0)
MCH: 29.7 pg (ref 26.0–34.0)
MCHC: 32.2 g/dL (ref 30.0–36.0)
MCV: 92.1 fL (ref 80.0–100.0)
Platelets: 215 10*3/uL (ref 150–400)
RBC: 3.17 MIL/uL — ABNORMAL LOW (ref 4.22–5.81)
RDW: 13.6 % (ref 11.5–15.5)
WBC: 8.1 10*3/uL (ref 4.0–10.5)
nRBC: 0 % (ref 0.0–0.2)

## 2021-05-06 LAB — BASIC METABOLIC PANEL
Anion gap: 9 (ref 5–15)
BUN: 28 mg/dL — ABNORMAL HIGH (ref 8–23)
CO2: 24 mmol/L (ref 22–32)
Calcium: 8.3 mg/dL — ABNORMAL LOW (ref 8.9–10.3)
Chloride: 105 mmol/L (ref 98–111)
Creatinine, Ser: 1.69 mg/dL — ABNORMAL HIGH (ref 0.61–1.24)
GFR, Estimated: 40 mL/min — ABNORMAL LOW (ref >60)
Glucose, Bld: 100 mg/dL — ABNORMAL HIGH (ref 70–99)
Potassium: 4.3 mmol/L (ref 3.5–5.1)
Sodium: 138 mmol/L (ref 135–145)

## 2021-05-06 LAB — LACTIC ACID, PLASMA: Lactic Acid, Venous: 1.2 mmol/L (ref 0.5–1.9)

## 2021-05-06 LAB — HEPATIC FUNCTION PANEL
ALT: 23 U/L (ref 0–44)
AST: 34 U/L (ref 15–41)
Albumin: 2.9 g/dL — ABNORMAL LOW (ref 3.5–5.0)
Alkaline Phosphatase: 198 U/L — ABNORMAL HIGH (ref 38–126)
Bilirubin, Direct: 0.2 mg/dL (ref 0.0–0.2)
Indirect Bilirubin: 0.4 mg/dL (ref 0.3–0.9)
Total Bilirubin: 0.6 mg/dL (ref 0.3–1.2)
Total Protein: 6.7 g/dL (ref 6.5–8.1)

## 2021-05-06 LAB — CBG MONITORING, ED: Glucose-Capillary: 102 mg/dL — ABNORMAL HIGH (ref 70–99)

## 2021-05-06 MED ORDER — HEPARIN SODIUM (PORCINE) 5000 UNIT/ML IJ SOLN
5000.0000 [IU] | Freq: Three times a day (TID) | INTRAMUSCULAR | Status: DC
  Administered 2021-05-06 – 2021-05-09 (×7): 5000 [IU] via SUBCUTANEOUS
  Filled 2021-05-06 (×7): qty 1

## 2021-05-06 MED ORDER — FINASTERIDE 5 MG PO TABS
5.0000 mg | ORAL_TABLET | Freq: Every day | ORAL | Status: DC
  Administered 2021-05-06 – 2021-05-09 (×4): 5 mg via ORAL
  Filled 2021-05-06 (×4): qty 1

## 2021-05-06 MED ORDER — CARVEDILOL 3.125 MG PO TABS
9.3750 mg | ORAL_TABLET | Freq: Two times a day (BID) | ORAL | Status: DC
  Administered 2021-05-06 – 2021-05-09 (×5): 9.375 mg via ORAL
  Filled 2021-05-06 (×6): qty 3

## 2021-05-06 MED ORDER — GABAPENTIN 100 MG PO CAPS
100.0000 mg | ORAL_CAPSULE | Freq: Every day | ORAL | Status: DC
  Administered 2021-05-06 – 2021-05-08 (×3): 100 mg via ORAL
  Filled 2021-05-06 (×4): qty 1

## 2021-05-06 MED ORDER — ASPIRIN EC 81 MG PO TBEC
81.0000 mg | DELAYED_RELEASE_TABLET | Freq: Every day | ORAL | Status: DC
  Administered 2021-05-06 – 2021-05-09 (×4): 81 mg via ORAL
  Filled 2021-05-06 (×4): qty 1

## 2021-05-06 MED ORDER — MORPHINE SULFATE (PF) 2 MG/ML IV SOLN: 2.0000 mg | INTRAVENOUS | Status: DC | PRN

## 2021-05-06 MED ORDER — ACETAMINOPHEN 325 MG PO TABS: 650.0000 mg | ORAL_TABLET | Freq: Four times a day (QID) | ORAL | Status: DC | PRN

## 2021-05-06 MED ORDER — HYDRALAZINE HCL 25 MG PO TABS
50.0000 mg | ORAL_TABLET | Freq: Three times a day (TID) | ORAL | Status: DC
  Administered 2021-05-06 – 2021-05-09 (×6): 50 mg via ORAL
  Filled 2021-05-06 (×8): qty 2

## 2021-05-06 MED ORDER — TAMSULOSIN HCL 0.4 MG PO CAPS
0.4000 mg | ORAL_CAPSULE | Freq: Every morning | ORAL | Status: DC
Start: 2021-05-07 — End: 2021-05-09
  Administered 2021-05-07 – 2021-05-09 (×3): 0.4 mg via ORAL
  Filled 2021-05-06 (×3): qty 1

## 2021-05-06 MED ORDER — PRAVASTATIN SODIUM 10 MG PO TABS
20.0000 mg | ORAL_TABLET | Freq: Every evening | ORAL | Status: DC
  Administered 2021-05-06 – 2021-05-08 (×3): 20 mg via ORAL
  Filled 2021-05-06 (×3): qty 2

## 2021-05-06 MED ORDER — OXYCODONE HCL 5 MG PO TABS
5.0000 mg | ORAL_TABLET | ORAL | Status: DC | PRN
  Filled 2021-05-06: qty 1

## 2021-05-06 MED ORDER — ONDANSETRON HCL 4 MG PO TABS: 4.0000 mg | ORAL_TABLET | Freq: Four times a day (QID) | ORAL | Status: DC | PRN

## 2021-05-06 MED ORDER — SODIUM CHLORIDE 0.9 % IV SOLN
1.0000 g | Freq: Once | INTRAVENOUS | Status: AC
  Administered 2021-05-06: 1 g via INTRAVENOUS
  Filled 2021-05-06: qty 10

## 2021-05-06 MED ORDER — FUROSEMIDE 40 MG PO TABS
40.0000 mg | ORAL_TABLET | Freq: Every day | ORAL | Status: DC
  Administered 2021-05-06 – 2021-05-09 (×4): 40 mg via ORAL
  Filled 2021-05-06 (×4): qty 1

## 2021-05-06 MED ORDER — METHIMAZOLE 5 MG PO TABS
20.0000 mg | ORAL_TABLET | Freq: Two times a day (BID) | ORAL | Status: DC
  Administered 2021-05-06 – 2021-05-09 (×6): 20 mg via ORAL
  Filled 2021-05-06 (×9): qty 4

## 2021-05-06 MED ORDER — ACETAMINOPHEN 650 MG RE SUPP: 650.0000 mg | Freq: Four times a day (QID) | RECTAL | Status: DC | PRN

## 2021-05-06 MED ORDER — ONDANSETRON HCL 4 MG/2ML IJ SOLN
4.0000 mg | Freq: Four times a day (QID) | INTRAMUSCULAR | Status: DC | PRN
Start: 2021-05-06 — End: 2021-05-09

## 2021-05-06 NOTE — ED Triage Notes (Signed)
Pt bib ems from home for increased weakness.  Reports 3 falls this week.  Pt has indwelling foley cath that was last changed on 3/18.  Pt has wounds to buttocks on arrival.  Resp even and unlabored.  Skin warm and dry.  Denies fever.  Recent treatment for uti.  ?

## 2021-05-06 NOTE — ED Provider Notes (Signed)
?[facility] ?Provider Note ? ? ?CSN: [id number] ?Arrival date & time: 05/06/21  1350 ? ?  ? ?History ? ?Chief Complaint  ?Patient presents with  ? Weakness  ? ? ?Derek Foster is a 86 y.o. male. ? ?Patient with a complaint of some weakness problems for about a month.  Does have home health coming out.  Things have gotten much worse in the past week.  And much worse today.  Patient basically had some very inconsequential falls this week and mostly not being able to get out of his lift chair and would slip.  Denies hitting his head denies hurting anything.  Patient's had a Foley catheter in place last changed March 18.  Patient arrived here with normal temp denied any fevers.  Denied any pain anywhere denied any upper respiratory symptoms any abdominal pain any nausea vomiting or diarrhea.  Blood pressure was 170/68. ? ?Past medical history significant for mixed hyperlipidemia essential hypertension hypothyroidism coronary artery disease at last cath was 2011 when he had a non-STEMI in 2011.  Does have DES to LAD and medical management of a 78% RCA.  Patient has a history of proximal atrial fibrillation and acute kidney injury chronic kidney disease stage IIIb GI bleeding hematuria mild pulmonary hypertension.  Past surgical history significant for removal of the gallbladder. ? ? ?  ? ?Home Medications ?Prior to Admission medications   ?Medication Sig Start Date End Date Taking? Authorizing Provider  ?acetaminophen (TYLENOL) 325 MG tablet Take 325 mg by mouth every 6 (six) hours as needed.   Yes [provider]  ?aspirin EC 81 MG tablet Take 81 mg by mouth daily. Swallow whole.   Yes [provider]  ?carvedilol (COREG) 6.25 MG tablet Take 1.5 tablets (9.375 mg total) by mouth 2 (two) times daily. 10/01/20  Yes Dunn, Nedra Hai, PA-C  ?Coenzyme Q10 200 MG capsule Take 200 mg by mouth daily.   Yes [provider]  ?diclofenac Sodium (VOLTAREN) 1 % GEL as needed.   Yes  [provider]  ?finasteride (PROSCAR) 5 MG tablet Take 1 tablet (5 mg total) by mouth daily. 12/20/20  Yes Irine Seal, MD  ?furosemide (LASIX) 20 MG tablet Take 2 tablets (40 mg total) by mouth daily. 07/19/19  Yes Gerlene Fee, NP  ?gabapentin (NEURONTIN) 100 MG capsule Take 100 mg by mouth daily. 05/16/20  Yes [provider]  ?hydrALAZINE (APRESOLINE) 50 MG tablet Take 1 tablet (50 mg total) by mouth 3 (three) times daily. 10/01/20 05/06/21 Yes Dunn, Dayna N, PA-C  ?methimazole (TAPAZOLE) 10 MG tablet Take 2 tablets (20 mg total) by mouth 2 (two) times daily. 07/19/19  Yes Gerlene Fee, NP  ?Multiple Vitamin (MULTIVITAMIN) tablet Take 1 tablet by mouth daily.   Yes [provider]  ?nitroGLYCERIN (NITROSTAT) 0.4 MG SL tablet Place 1 tablet (0.4 mg total) under the tongue every 5 (five) minutes as needed. 07/19/19  Yes Gerlene Fee, NP  ?potassium chloride (KLOR-CON) 10 MEQ tablet Take 1 tablet (10 mEq total) by mouth every evening. ?Patient taking differently: Take 10 mEq by mouth daily. 07/19/19  Yes Gerlene Fee, NP  ?pravastatin (PRAVACHOL) 20 MG tablet Take 1 tablet (20 mg total) by mouth every evening. 10/04/20 05/06/21 Yes Imogene Burn, PA-C  ?tamsulosin (FLOMAX) 0.4 MG CAPS capsule Take 0.4 mg by mouth every morning. 04/10/21  Yes [provider]  ?   ? ?Allergies    ?Indomethacin, Iodine-131, and Lidocaine-menthol   ? ?  Review of Systems   ?Review of Systems  ?Constitutional:  Positive for fatigue. Negative for chills and fever.  ?HENT:  Negative for ear pain and sore throat.   ?Eyes:  Negative for pain and visual disturbance.  ?Respiratory:  Negative for cough and shortness of breath.   ?Cardiovascular:  Positive for leg swelling. Negative for chest pain and palpitations.  ?Gastrointestinal:  Negative for abdominal pain and vomiting.  ?Genitourinary:  Negative for dysuria and hematuria.  ?Musculoskeletal:  Negative for arthralgias and back pain.  ?Skin:   Negative for color change and rash.  ?Neurological:  Positive for weakness. Negative for seizures and syncope.  ?All other systems reviewed and are negative. ? ?Physical Exam ?Updated Vital Signs ?BP (!) 121/96   Pulse 91   Temp 97.6 ?F (36.4 ?C)   Resp (!) 22   Ht 1.829 m (6')   Wt (!) 142.9 kg   SpO2 98%   BMI 42.72 kg/m?  ?Physical Exam ?Vitals and nursing note reviewed.  ?Constitutional:   ?   General: He is not in acute distress. ?   Appearance: He is well-developed. He is obese. He is not ill-appearing or toxic-appearing.  ?HENT:  ?   Head: Normocephalic and atraumatic.  ?   Mouth/Throat:  ?   Mouth: Mucous membranes are moist.  ?Eyes:  ?   Extraocular Movements: Extraocular movements intact.  ?   Conjunctiva/sclera: Conjunctivae normal.  ?   Pupils: Pupils are equal, round, and reactive to light.  ?Cardiovascular:  ?   Rate and Rhythm: Normal rate and regular rhythm.  ?   Heart sounds: No murmur heard. ?Pulmonary:  ?   Effort: Pulmonary effort is normal. No respiratory distress.  ?   Breath sounds: Normal breath sounds. No wheezing or rales.  ?Abdominal:  ?   Palpations: Abdomen is soft.  ?   Tenderness: There is no abdominal tenderness.  ?Musculoskeletal:     ?   General: No swelling.  ?   Cervical back: Neck supple.  ?   Right lower leg: Edema present.  ?   Left lower leg: Edema present.  ?   Comments: Bilateral lower extremity edema with chronic skin changes and some erythema both legs.  Do not think it is consistent with cellulitis.  ?Skin: ?   General: Skin is warm and dry.  ?   Capillary Refill: Capillary refill takes less than 2 seconds.  ?Neurological:  ?   General: No focal deficit present.  ?   Mental Status: He is alert and oriented to person, place, and time.  ?Psychiatric:     ?   Mood and Affect: Mood normal.  ? ? ?ED Results / Procedures / Treatments   ?Labs ?(all labs ordered are listed, but only abnormal results are displayed) ?Labs Reviewed  ?BASIC METABOLIC PANEL - Abnormal; Notable  for the following components:  ?    Result Value  ? Glucose, Bld 100 (*)   ? BUN 28 (*)   ? Creatinine, Ser 1.69 (*)   ? Calcium 8.3 (*)   ? GFR, Estimated 40 (*)   ? All other components within normal limits  ?CBC - Abnormal; Notable for the following components:  ? RBC 3.17 (*)   ? Hemoglobin 9.4 (*)   ? HCT 29.2 (*)   ? All other components within normal limits  ?URINALYSIS, ROUTINE W REFLEX MICROSCOPIC - Abnormal; Notable for the following components:  ? APPearance TURBID (*)   ? Hgb urine dipstick  MODERATE (*)   ? Protein, ur 100 (*)   ? Leukocytes,Ua LARGE (*)   ? RBC / HPF >50 (*)   ? WBC, UA >50 (*)   ? Bacteria, UA MANY (*)   ? All other components within normal limits  ?HEPATIC FUNCTION PANEL - Abnormal; Notable for the following components:  ? Albumin 2.9 (*)   ? Alkaline Phosphatase 198 (*)   ? All other components within normal limits  ?CBG MONITORING, ED - Abnormal; Notable for the following components:  ? Glucose-Capillary 102 (*)   ? All other components within normal limits  ?URINE CULTURE  ?LACTIC ACID, PLASMA  ? ? ?EKG ?EKG Interpretation ? ?Date/Time:  Monday May 06 2021 14:02:43 EDT ?Ventricular Rate:  86 ?PR Interval:  53 ?QRS Duration: 96 ?QT Interval:  397 ?QTC Calculation: 475 ?R Axis:   47 ?Text Interpretation: Unknown rhythm, irregular rate Short PR interval Confirmed by Fredia Sorrow [phone]) on 05/06/2021 3:14:34 PM ? ?Radiology ?DG Chest Port 1 View ? ?Result Date: 05/06/2021 ?CLINICAL DATA:  Weakness, fell EXAM: PORTABLE CHEST 1 VIEW COMPARISON:  07/02/2019 FINDINGS: The heart size and mediastinal contours are within normal limits. Both lungs are clear. The visualized skeletal structures are unremarkable. IMPRESSION: No active disease. Electronically Signed   By: Randa Ngo M.D.   On: 05/06/2021 18:39   ? ?Procedures ?Procedures  ? ? ?Medications Ordered in ED ?Medications  ?cefTRIAXone (ROCEPHIN) 1 g in sodium chloride 0.9 % 100 mL IVPB (0 g Intravenous Stopped 05/06/21 1923)   ? ? ?ED Course/ Medical Decision Making/ A&P ?  ?                        ?Medical Decision Making ?Amount and/or Complexity of Data Reviewed ?Labs: ordered. ?Radiology: ordered. ? ?Risk ?Decision regarding hospitalizatio

## 2021-05-07 ENCOUNTER — Observation Stay (HOSPITAL_COMMUNITY): Payer: Medicare Other

## 2021-05-07 DIAGNOSIS — N390 Urinary tract infection, site not specified: Secondary | ICD-10-CM

## 2021-05-07 DIAGNOSIS — E782 Mixed hyperlipidemia: Secondary | ICD-10-CM

## 2021-05-07 DIAGNOSIS — E0590 Thyrotoxicosis, unspecified without thyrotoxic crisis or storm: Secondary | ICD-10-CM | POA: Diagnosis not present

## 2021-05-07 DIAGNOSIS — R600 Localized edema: Secondary | ICD-10-CM | POA: Diagnosis not present

## 2021-05-07 DIAGNOSIS — R531 Weakness: Secondary | ICD-10-CM | POA: Diagnosis not present

## 2021-05-07 DIAGNOSIS — T83511D Infection and inflammatory reaction due to indwelling urethral catheter, subsequent encounter: Secondary | ICD-10-CM

## 2021-05-07 DIAGNOSIS — I10 Essential (primary) hypertension: Secondary | ICD-10-CM | POA: Diagnosis not present

## 2021-05-07 DIAGNOSIS — I480 Paroxysmal atrial fibrillation: Secondary | ICD-10-CM

## 2021-05-07 DIAGNOSIS — Z0389 Encounter for observation for other suspected diseases and conditions ruled out: Secondary | ICD-10-CM | POA: Diagnosis not present

## 2021-05-07 DIAGNOSIS — E43 Unspecified severe protein-calorie malnutrition: Secondary | ICD-10-CM

## 2021-05-07 DIAGNOSIS — D649 Anemia, unspecified: Secondary | ICD-10-CM

## 2021-05-07 LAB — CBC WITH DIFFERENTIAL/PLATELET
Abs Immature Granulocytes: 0.17 10*3/uL — ABNORMAL HIGH (ref 0.00–0.07)
Basophils Absolute: 0 10*3/uL (ref 0.0–0.1)
Basophils Relative: 0 %
Eosinophils Absolute: 0.2 10*3/uL (ref 0.0–0.5)
Eosinophils Relative: 2 %
HCT: 26.7 % — ABNORMAL LOW (ref 39.0–52.0)
Hemoglobin: 8.6 g/dL — ABNORMAL LOW (ref 13.0–17.0)
Immature Granulocytes: 2 %
Lymphocytes Relative: 20 %
Lymphs Abs: 1.5 10*3/uL (ref 0.7–4.0)
MCH: 29.7 pg (ref 26.0–34.0)
MCHC: 32.2 g/dL (ref 30.0–36.0)
MCV: 92.1 fL (ref 80.0–100.0)
Monocytes Absolute: 0.8 10*3/uL (ref 0.1–1.0)
Monocytes Relative: 10 %
Neutro Abs: 5 10*3/uL (ref 1.7–7.7)
Neutrophils Relative %: 66 %
Platelets: 203 10*3/uL (ref 150–400)
RBC: 2.9 MIL/uL — ABNORMAL LOW (ref 4.22–5.81)
RDW: 13.6 % (ref 11.5–15.5)
WBC: 7.7 10*3/uL (ref 4.0–10.5)
nRBC: 0 % (ref 0.0–0.2)

## 2021-05-07 LAB — COMPREHENSIVE METABOLIC PANEL
ALT: 18 U/L (ref 0–44)
AST: 26 U/L (ref 15–41)
Albumin: 2.4 g/dL — ABNORMAL LOW (ref 3.5–5.0)
Alkaline Phosphatase: 172 U/L — ABNORMAL HIGH (ref 38–126)
Anion gap: 7 (ref 5–15)
BUN: 29 mg/dL — ABNORMAL HIGH (ref 8–23)
CO2: 25 mmol/L (ref 22–32)
Calcium: 7.9 mg/dL — ABNORMAL LOW (ref 8.9–10.3)
Chloride: 108 mmol/L (ref 98–111)
Creatinine, Ser: 1.6 mg/dL — ABNORMAL HIGH (ref 0.61–1.24)
GFR, Estimated: 42 mL/min — ABNORMAL LOW (ref >60)
Glucose, Bld: 107 mg/dL — ABNORMAL HIGH (ref 70–99)
Potassium: 3.9 mmol/L (ref 3.5–5.1)
Sodium: 140 mmol/L (ref 135–145)
Total Bilirubin: 0.2 mg/dL — ABNORMAL LOW (ref 0.3–1.2)
Total Protein: 5.6 g/dL — ABNORMAL LOW (ref 6.5–8.1)

## 2021-05-07 LAB — VITAMIN B12: Vitamin B-12: 227 pg/mL (ref 180–914)

## 2021-05-07 LAB — MAGNESIUM: Magnesium: 2.2 mg/dL (ref 1.7–2.4)

## 2021-05-07 LAB — T4, FREE: Free T4: 1.24 ng/dL — ABNORMAL HIGH (ref 0.61–1.12)

## 2021-05-07 MED ORDER — GUAIFENESIN ER 600 MG PO TB12
600.0000 mg | ORAL_TABLET | Freq: Two times a day (BID) | ORAL | Status: DC
Start: 2021-05-07 — End: 2021-05-09
  Administered 2021-05-07 – 2021-05-09 (×5): 600 mg via ORAL
  Filled 2021-05-07 (×5): qty 1

## 2021-05-07 MED ORDER — SODIUM CHLORIDE 0.9 % IV SOLN
1.0000 g | INTRAVENOUS | Status: DC
  Administered 2021-05-07 – 2021-05-08 (×2): 1 g via INTRAVENOUS
  Filled 2021-05-07 (×2): qty 10

## 2021-05-07 MED ORDER — CHLORHEXIDINE GLUCONATE CLOTH 2 % EX PADS
6.0000 | MEDICATED_PAD | Freq: Every day | CUTANEOUS | Status: DC
  Administered 2021-05-07 – 2021-05-09 (×3): 6 via TOPICAL

## 2021-05-07 NOTE — Care Management Obs Status (Signed)
MEDICARE OBSERVATION STATUS NOTIFICATION ? ? ?Patient Details  ?Name: Derek Foster ?MRN: [id number] ?Date of Birth: Feb 12, 1935 ? ? ?Medicare Observation Status Notification Given:  Yes ? ? ? ?Tommy Medal ?05/07/2021, 4:01 PM ?

## 2021-05-07 NOTE — Plan of Care (Signed)
?  Problem: Acute Rehab OT Goals (only OT should resolve) ?Goal: Pt. Will Perform Grooming ?Flowsheets (Taken 05/07/2021 1010) ?Pt Will Perform Grooming: ? standing ? with min assist ? with adaptive equipment ?Goal: Pt. Will Perform Lower Body Bathing ?Flowsheets (Taken 05/07/2021 1010) ?Pt Will Perform Lower Body Bathing: ? with min assist ? sitting/lateral leans ? with adaptive equipment ?Goal: Pt. Will Perform Lower Body Dressing ?Flowsheets (Taken 05/07/2021 1010) ?Pt Will Perform Lower Body Dressing: ? with min assist ? sitting/lateral leans ? with adaptive equipment ?Goal: Pt. Will Transfer To Toilet ?Flowsheets (Taken 05/07/2021 1010) ?Pt Will Transfer to Toilet: ? with min guard assist ? stand pivot transfer ? bedside commode ?Goal: Pt/Caregiver Will Perform Home Exercise Program ?Flowsheets (Taken 05/07/2021 1010) ?Pt/caregiver will Perform Home Exercise Program: ? Increased strength ? Both right and left upper extremity ? Independently ? Garvin Ellena OT, MOT ? ?

## 2021-05-07 NOTE — Evaluation (Signed)
Occupational Therapy Evaluation ?Patient Details ?Name: Derek Foster ?MRN: [id number] ?DOB: 02/08/1935 ?Today's Date: 05/07/2021 ? ? ?History of Present Illness Derek Foster is a 86 y.o. male with medical history significant of with history of coronary artery disease, essential hypertension, hypothyroidism mixed hyperlipidemia, NSTEMI, paroxysmal atrial fibrillation, and more presents to ED with a chief complaint of weakness.  Patient reports he is so weak that he could not stand up.  Home PT came today and put a belt on him and tried to stand him up but his legs were not able to hold him up.  There is no pain in the legs.  There is no numbness or tingling.  There is no asymmetric weakness.  Patient reports that he has a decrease in appetite but he has not been feeling good.  He cannot explain what he means when he says he has not been feeling good.  He denies nausea, vomiting, stomach pain, diarrhea.  He does admit to 3 days of constipation but did have a bowel movement while in the ED.  Patient denies back pain, bladder pain.  He has an indwelling Foley.  His urine in the Foley bag is brown, he thinks that that may have started yesterday, but he is not sure.  Patient has no other complaints. (Per MD note)  ? ?Clinical Impression ?  ?Pt agreeable to OT and PT co-evaluation. Pt has seen decline in ADL and mobility status over the past month. This date pt demonstrates overall weakness as seen by need for moderate assist for bed mobility and mod to max A for step pivot transfer to chair. Pt requires total assist for donning socks at bed level, which is consistent with level of assist needed in the past month. Pt is unsteady in standing with poor balance with use of RW. Pt noted to have bleeding from foley catheter in standing. Catheter loosened from securing patch on pt L thigh by PT. Nursing notified. Pt will benefit from continued OT in the hospital and recommended venue below to increase strength, balance, and  endurance for safe ADL's.  ? ?  ?   ? ?Recommendations for follow up therapy are one component of a multi-disciplinary discharge planning process, led by the attending physician.  Recommendations may be updated based on patient status, additional functional criteria and insurance authorization.  ? ?Follow Up Recommendations ? Skilled nursing-short term rehab (<3 hours/day)  ?  ?Assistance Recommended at Discharge Intermittent Supervision/Assistance  ?Patient can return home with the following A lot of help with walking and/or transfers;A lot of help with bathing/dressing/bathroom;Assistance with cooking/housework;Assist for transportation;Help with stairs or ramp for entrance ? ?  ?Functional Status Assessment ? Patient has had a recent decline in their functional status and demonstrates the ability to make significant improvements in function in a reasonable and predictable amount of time.  ?Equipment Recommendations ? None recommended by OT  ?  ?   ? ? ?  ?Precautions / Restrictions Precautions ?Precautions: Fall ?Restrictions ?Weight Bearing Restrictions: No  ? ?  ? ?Mobility Bed Mobility ?Overal bed mobility: Needs Assistance ?Bed Mobility: Supine to Sit ?  ?  ?Supine to sit: Mod assist ?  ?  ?General bed mobility comments: slow labored movement; assist to move B LE to edge of bed and pull to sit. ?  ? ?Transfers ?Overall transfer level: Needs assistance ?Equipment used: Rolling walker (2 wheels) ?Transfers: Sit to/from Stand, Bed to chair/wheelchair/BSC ?Sit to Stand: Mod assist ?  ?  ?Step  pivot transfers: Mod assist, Max assist ?  ?  ?General transfer comment: Slow labored movement. Gait belt and assist with therapist on either side of pt for safety. ?  ? ?  ?Balance Overall balance assessment: Needs assistance ?Sitting-balance support: No upper extremity supported, Feet supported ?Sitting balance-Leahy Scale: Fair ?Sitting balance - Comments: fair to good seated at EOB ?  ?Standing balance support: During  functional activity, Bilateral upper extremity supported, Reliant on assistive device for balance ?Standing balance-Leahy Scale: Poor ?Standing balance comment: using RW ?  ?  ?  ?  ?  ?  ?  ?  ?  ?  ?  ?   ? ?ADL either performed or assessed with clinical judgement  ? ?ADL Overall ADL's : Needs assistance/impaired ?  ?  ?Grooming: Standing;Maximal assistance ?  ?Upper Body Bathing: Set up;Sitting ?  ?Lower Body Bathing: Maximal assistance;Total assistance;Bed level ?  ?  ?  ?Lower Body Dressing: Maximal assistance;Total assistance;Bed level ?Lower Body Dressing Details (indicate cue type and reason): Pt requried assist to don socks at bed level. ?Toilet Transfer: Moderate assistance;Maximal assistance;Rolling walker (2 wheels);Stand-pivot ?Toilet Transfer Details (indicate cue type and reason): Simulated via EOB to chair with RW. ?Toileting- Clothing Manipulation and Hygiene: Maximal assistance;Total assistance;Bed level ?  ?  ?  ?Functional mobility during ADLs: Maximal assistance;Rolling walker (2 wheels);Moderate assistance ?General ADL Comments: Pt demosntrates weakness in standing and recent decrease in ADL status. Pt limited overall by decreased strength.  ? ? ? ?Vision Baseline Vision/History: 1 Wears glasses ?Ability to See in Adequate Light: 1 Impaired ?Patient Visual Report: No change from baseline ?Vision Assessment?: No apparent visual deficits  ?   ?   ?  ?   ?  ? ?Pertinent Vitals/Pain Pain Assessment ?Pain Assessment: 0-10 ?Pain Score: 3  ?Pain Location: area of indwlling folley catheter ?Pain Descriptors / Indicators: Burning ?Pain Intervention(s): Limited activity within patient's tolerance, Monitored during session, Repositioned  ? ? ? ?Hand Dominance Right ?  ?Extremity/Trunk Assessment Upper Extremity Assessment ?Upper Extremity Assessment: Generalized weakness ?  ?Lower Extremity Assessment ?Lower Extremity Assessment: Defer to PT evaluation ?  ?Cervical / Trunk Assessment ?Cervical / Trunk  Assessment: Normal ?  ?Communication Communication ?Communication: No difficulties ?  ?Cognition Arousal/Alertness: Awake/alert ?Behavior During Therapy: [facility] for tasks assessed/performed ?Overall Cognitive Status: Within Functional Limits for tasks assessed ?  ?  ?  ?  ?  ?  ?  ?  ?  ?  ?  ?  ?  ?  ?  ?  ?  ?  ?  ?   ? ?  ?   ?  ?    ? ? ?Home Living Family/patient expects to be discharged to:: Private residence ?Living Arrangements: Children ?Available Help at Discharge: Available PRN/intermittently ?Type of Home: House ?Home Access: Ramped entrance ?  ?  ?Home Layout: Two level;Other (Comment);Able to live on main level with bedroom/bathroom (basement) ?Alternate Level Stairs-Number of Steps:  (Pt can stay on frist level) ?  ?Bathroom Shower/Tub: Tub/shower unit ?  ?Bathroom Toilet: Handicapped height ?Bathroom Accessibility: Yes ?How Accessible: Accessible via walker ?Home Equipment: Rollator (4 wheels);BSC/3in1;Grab bars - toilet;Grab bars - tub/shower;Wheelchair - Education administrator (comment) (Lift chair) ?  ?Additional Comments: Pt reports assist from aide in last month. ?  ? ?  ?Prior Functioning/Environment Prior Level of Function : Needs assist ?  ?  ?  ?Physical Assist : Mobility (physical);ADLs (physical) ?Mobility (physical): Bed mobility;Transfers;Gait;Stairs ?ADLs (physical): Bathing;Dressing;Toileting;IADLs ?Mobility Comments: Pt  reports use of rollator at baseline. Unable to stand with PT leading to hospital admission. Pt mobility has decreased significantly in the past month. Pt used to drive 2 to 3 months ago. ?ADLs Comments: Pt was indepndent for ADL's prior to a month ago. Now pt requries assist from aide for bathing, dressing, and toileting. Indepndent grooming and feeding. Assisted by family for IADL's. ?  ? ?  ?  ?OT Problem List: Decreased strength;Decreased activity tolerance;Impaired balance (sitting and/or standing) ?  ?   ?OT Treatment/Interventions: Self-care/ADL training;Therapeutic  exercise;Energy conservation;DME and/or AE instruction;Therapeutic activities;Patient/family education;Balance training  ?  ?OT Goals(Current goals can be found in the care plan section) Acute Rehab OT Goals ?Patient Edmund Hilda

## 2021-05-07 NOTE — Assessment & Plan Note (Addendum)
-   Continue Coreg ?-hold hydralazine ?Continue to monitor ?

## 2021-05-07 NOTE — Assessment & Plan Note (Addendum)
-   Continue methimazole ?-Free T4 = 1.24  ?-Continue to monitor ?

## 2021-05-07 NOTE — Progress Notes (Signed)
PROGRESS NOTE ? ?Brief Narrative: ?Derek Foster is an 86 y.o. male with a history of CAD, HTN, HLD, NSTEMI, PAF, hypothyroidism, and chronic urinary catheterization who presented to the ED with progressive weakness at home. His exam was nonfocal with UA indicative of UTI. Foley catheter was exchanged, ceftriaxone started pending urine culture, and PT/OT evaluations were ordered. ? ?Subjective: ?Worked with therapy to get to chair by the bed with stable significant diffuse weakness. Swelling in legs is at usual baseline. He has significant burning/pain in the penis since having foley catheter exchanged and some bleeding earlier this morning as well.  ? ?Objective: ?BP (!) 154/70 (BP Location: Left Arm)   Pulse 86   Temp 98.2 ?F (36.8 ?C)   Resp 18   Ht 6' (1.829 m)   Wt (!) 142.9 kg   SpO2 99%   BMI 42.72 kg/m?   ?Gen: Pleasant elderly obese male in no distress ?Pulm: Clear and nonlabored on room air with upper airway transmission. No wheezes.  ?CV: No JVD, 2+ pitting bilateral LE edema ?GI: Soft, NT, ND, +BS  ?Neuro: Alert and oriented. Diffuse weakness that is not readily apparent on focused assessment. No focal deficits. ?Skin: No acute rashes, lesions or ulcers Lower legs with demarcated erythema that is not tender. ? ?Assessment & Plan: ?86 y.o. male with a history of weakness requiring SNF level rehabilitation about 6 months ago who has been getting gradually more weak over the past 1-2 months. He had attempted to pursue rehabilitation placement from home without success. Symptoms progressed and he has now been admitted with weakness and UTI.  ?- PT/OT evaluations > TOC is consulted for SNF placement.  ?- Will continue antibiotics for UTI pending culture.  ?- Significant pain since exchanging foley. No apparent complication, no hematuria or ongoing bleeding noted, but patient hasn't had this much pain before and has had catheter for years. Will check limited U/S to confirm correct positioning and  otherwise treat symptomatically.  ?- Otherwise, please see H&P from Dr. Clearence Ped. ? ?Patrecia Pour, MD ?Pager on amion ?05/07/2021, 12:40 PM   ?

## 2021-05-07 NOTE — TOC Initial Note (Signed)
Transition of Care (TOC) - Initial/Assessment Note  ? ? ?Patient Details  ?Name: DEFORREST BOGLE ?MRN: [id number] ?Date of Birth: 06-Apr-1935 ? ?Transition of Care (TOC) CM/SW Contact:    ?Gaspare Netzel D, LCSW ?Phone Number: ?05/07/2021, 3:44 PM ? ?Clinical Narrative:                 ?Patient from home. Observation for generalized weakness. PT recommends SNF. Agreeable to SNF. Referred to facility of choice. Patient reports that he has been vaccinated for COVID three times.  ? ?Expected Discharge Plan: [place] ?Barriers to Discharge: Continued Medical Work up ? ? ?Patient Goals and CMS Choice ?  ?  ?  ? ?Expected Discharge Plan and Services ?Expected Discharge Plan: [place] ?  ?  ?Post Acute Care Choice: [facility] ?Living arrangements for the past 2 months: [place] ?                ?  ?  ?  ?  ?  ?  ?  ?  ?  ?  ? ?Prior Living Arrangements/Services ?Living arrangements for the past 2 months: [place] ?Lives with:: Spouse ?  ?       ?  ?  ?  ?  ? ?Activities of Daily Living ?Home Assistive Devices/Equipment: Gilford Rile (specify type) ?ADL Screening (condition at time of admission) ?Patient's cognitive ability adequate to safely complete daily activities?: Yes ?Is the patient deaf or have difficulty hearing?: No ?Does the patient have difficulty seeing, even when wearing glasses/contacts?: No ?Does the patient have difficulty concentrating, remembering, or making decisions?: No ?Patient able to express need for assistance with ADLs?: Yes ?Does the patient have difficulty dressing or bathing?: Yes ?Independently performs ADLs?: No ?Communication: Independent ?Dressing (OT): Needs assistance ?Is this a change from baseline?: Pre-admission baseline ?Grooming: Independent ?Feeding: Independent ?Bathing: Needs assistance ?Is this a change from baseline?: Pre-admission baseline ?Toileting: Needs assistance ?Is this a change from baseline?: Pre-admission  baseline ?In/Out Bed: Needs assistance ?Is this a change from baseline?: Pre-admission baseline ?Walks in Home: Needs assistance ?Is this a change from baseline?: Pre-admission baseline ?Does the patient have difficulty walking or climbing stairs?: Yes ?Weakness of Legs: Both ?Weakness of Arms/Hands: None ? ?Permission Sought/Granted ?  ?  ?   ?   ?   ?   ? ?Emotional Assessment ?  ?  ?  ?  ?  ?  ? ?Admission diagnosis:  Weakness [R53.1] ?Acute cystitis without hematuria [N30.00] ?Generalized weakness [R53.1] ?Patient Active Problem List  ? Diagnosis Date Noted  ? Anemia 05/07/2021  ? Generalized weakness 05/06/2021  ? Acute blood loss anemia 07/16/2019  ? Blood in stool 07/15/2019  ? Bilateral lower extremity edema 07/10/2019  ? Hypokalemia 07/10/2019  ? Aspiration pneumonia ([place]) 07/06/2019  ? Ventral hernia with bowel obstruction   ? Small bowel obstruction ([place]) 06/25/2019  ? Candidal UTI (urinary tract infection) 06/25/2019  ? UTI (urinary tract infection) due to urinary indwelling catheter ([place]) 04/11/2019  ? Pleural effusion 04/11/2019  ? Radiculopathy 04/11/2019  ? PAF (paroxysmal atrial fibrillation) ([place]) 04/11/2019  ? Anticoagulated 04/07/2019  ? BPH (benign prostatic hyperplasia) 03/28/2019  ? GERD without esophagitis 03/28/2019  ? Protein-calorie malnutrition, severe ([place]) 03/28/2019  ? Palliative care by specialist   ? DNR (do not resuscitate)   ? Grief   ? Gross hematuria   ? AKI (acute kidney injury) ([place]) 02/12/2019  ? SBO (small bowel obstruction) ([place]) 02/12/2019  ?  CAD S/P percutaneous coronary angioplasty 02/11/2019  ? Atrial fibrillation, chronic ([place]) 02/11/2019  ? Mixed hyperlipidemia 09/10/2009  ? EDEMA 02/27/2009  ? Hyperthyroidism 02/26/2009  ? Essential hypertension 02/26/2009  ? MYOCARDIAL INFARCTION 02/26/2009  ? ?PCP:  Celene Squibb, MD ?Pharmacy:   ?[facility] [place] [facility] ?[place] [address] ?Phone: [phone] Fax:  [phone] ? ?[facility] [place], [place] ?[place] ?[place] [address] ?Phone: [phone] Fax: [phone] ? ? ? ? ?Social Determinants of Health (SDOH) Interventions ?  ? ?Readmission Risk Interventions ? ?  06/30/2019  ?  3:30 PM 03/11/2019  ?  1:35 PM  ?Readmission Risk Prevention Plan  ?Transportation Screening Complete Complete  ?PCP or Specialist Appt within 3-5 Days Not Complete Not Complete  ?Not Complete comments SNF is expected discharge plan plan for SNF  ?[place] or Home Care Consult Complete Not Complete  ?[place] or Home Care Consult comments  plan for SNF  ?Social Work Consult for [place] Planning/Counseling Complete Complete  ?Palliative Care Screening Not Applicable Complete  ?Medication Review Press photographer) Complete Referral to Pharmacy  ? ? ? ?

## 2021-05-07 NOTE — NC FL2 (Signed)
?[place] MEDICAID FL2 LEVEL OF CARE SCREENING TOOL  ?  ? ?IDENTIFICATION  ?Patient Name: ?Derek Foster Birthdate: 1935/03/23 Sex: male Admission Date (Current Location): ?05/06/2021  ?[place] and [place] Number: ? [place] and Address:  ?[facility] [address], [place] ?     Provider Number: ?[id number]  ?Attending Physician Name and Address:  ?Patrecia Pour, MD ? Relative Name and Phone Number:  ?Hence, [name] (Daughter)   [phone] ?   ?Current Level of Care: ?Hospital (observation) Recommended Level of Care: ?[place] Prior Approval Number: ?  ? ?Date Approved/Denied: ?  PASRR Number: ?[id number] A ? ?Discharge Plan: ?SNF ?  ? ?Current Diagnoses: ?Patient Active Problem List  ? Diagnosis Date Noted  ? Anemia 05/07/2021  ? Generalized weakness 05/06/2021  ? Acute blood loss anemia 07/16/2019  ? Blood in stool 07/15/2019  ? Bilateral lower extremity edema 07/10/2019  ? Hypokalemia 07/10/2019  ? Aspiration pneumonia ([place]) 07/06/2019  ? Ventral hernia with bowel obstruction   ? Small bowel obstruction ([place]) 06/25/2019  ? Candidal UTI (urinary tract infection) 06/25/2019  ? UTI (urinary tract infection) due to urinary indwelling catheter ([place]) 04/11/2019  ? Pleural effusion 04/11/2019  ? Radiculopathy 04/11/2019  ? PAF (paroxysmal atrial fibrillation) ([place]) 04/11/2019  ? Anticoagulated 04/07/2019  ? BPH (benign prostatic hyperplasia) 03/28/2019  ? GERD without esophagitis 03/28/2019  ? Protein-calorie malnutrition, severe ([place]) 03/28/2019  ? Palliative care by specialist   ? DNR (do not resuscitate)   ? Grief   ? Gross hematuria   ? AKI (acute kidney injury) ([place]) 02/12/2019  ? SBO (small bowel obstruction) ([place]) 02/12/2019  ? CAD S/P percutaneous coronary angioplasty 02/11/2019  ? Atrial fibrillation, chronic ([place]) 02/11/2019  ? Mixed hyperlipidemia 09/10/2009  ? EDEMA 02/27/2009  ? Hyperthyroidism 02/26/2009  ? Essential hypertension 02/26/2009  ?  MYOCARDIAL INFARCTION 02/26/2009  ? ? ?Orientation RESPIRATION BLADDER Height & Weight   ?  ?Self, Situation, Place, Time ? Normal Continent Weight: (!) 315 lb (142.9 kg) ?Height:  6' (182.9 cm)  ?BEHAVIORAL SYMPTOMS/MOOD NEUROLOGICAL BOWEL NUTRITION STATUS  ?    Continent Diet (heart healthy)  ?AMBULATORY STATUS COMMUNICATION OF NEEDS Skin   ?Limited Assist Verbally Normal ?  ?  ?  ?    ?     ?     ? ? ?Personal Care Assistance Level of Assistance  ?Bathing, Dressing, Feeding Bathing Assistance: Limited assistance ?Feeding assistance: Independent ?Dressing Assistance: Limited assistance ?   ? ?Functional Limitations Info  ?    ?  ?   ? ? ?SPECIAL CARE FACTORS FREQUENCY  ?PT (By licensed PT)   ?  ?PT Frequency: 5x/week ?  ?  ?  ?  ?   ? ? ?Contractures    ? ? ?Additional Factors Info  ?Code Status, Allergies Code Status Info: DNR ?Allergies Info: Indomethacin, Iodine 131, Lidocaine menthol ?  ?  ?  ?   ? ?Current Medications (05/07/2021):  This is the current hospital active medication list ?Current Facility-Administered Medications  ?Medication Dose Route Frequency Provider Last Rate Last Admin  ? acetaminophen (TYLENOL) tablet 650 mg  650 mg Oral Q6H PRN Zierle-Ghosh, Asia B, DO      ? Or  ? acetaminophen (TYLENOL) suppository 650 mg  650 mg Rectal Q6H PRN Zierle-Ghosh, Asia B, DO      ? aspirin EC tablet 81 mg  81 mg Oral Daily Zierle-Ghosh, Asia B, DO   81 mg  at 05/07/21 0946  ? carvedilol (COREG) tablet 9.375 mg  9.375 mg Oral BID Zierle-Ghosh, Asia B, DO   9.375 mg at 05/07/21 0946  ? cefTRIAXone (ROCEPHIN) 1 g in sodium chloride 0.9 % 100 mL IVPB  1 g Intravenous Q24H Zierle-Ghosh, Asia B, DO      ? finasteride (PROSCAR) tablet 5 mg  5 mg Oral Daily Zierle-Ghosh, Asia B, DO   5 mg at 05/07/21 0946  ? furosemide (LASIX) tablet 40 mg  40 mg Oral Daily Zierle-Ghosh, Asia B, DO   40 mg at 05/07/21 0946  ? gabapentin (NEURONTIN) capsule 100 mg  100 mg Oral Daily Zierle-Ghosh, Asia B, DO   100 mg at 05/06/21 2233  ?  guaiFENesin (MUCINEX) 12 hr tablet 600 mg  600 mg Oral BID Vance Gather B, MD   600 mg at 05/07/21 1352  ? heparin injection 5,000 Units  5,000 Units Subcutaneous Q8H Zierle-Ghosh, Asia B, DO   5,000 Units at 05/07/21 1352  ? hydrALAZINE (APRESOLINE) tablet 50 mg  50 mg Oral TID Zierle-Ghosh, Asia B, DO   50 mg at 05/07/21 0946  ? methimazole (TAPAZOLE) tablet 20 mg  20 mg Oral BID Zierle-Ghosh, Asia B, DO   20 mg at 05/07/21 0945  ? morphine (PF) 2 MG/ML injection 2 mg  2 mg Intravenous Q2H PRN Zierle-Ghosh, Asia B, DO      ? ondansetron (ZOFRAN) tablet 4 mg  4 mg Oral Q6H PRN Zierle-Ghosh, Asia B, DO      ? Or  ? ondansetron (ZOFRAN) injection 4 mg  4 mg Intravenous Q6H PRN Zierle-Ghosh, Asia B, DO      ? oxyCODONE (Oxy IR/ROXICODONE) immediate release tablet 5 mg  5 mg Oral Q4H PRN Zierle-Ghosh, Asia B, DO      ? pravastatin (PRAVACHOL) tablet 20 mg  20 mg Oral QPM Zierle-Ghosh, Asia B, DO   20 mg at 05/06/21 2232  ? tamsulosin (FLOMAX) capsule 0.4 mg  0.4 mg Oral q morning Zierle-Ghosh, Asia B, DO   0.4 mg at 05/07/21 0946  ? ? ? ?Discharge Medications: ?Please see discharge summary for a list of discharge medications. ? ?Relevant Imaging Results: ? ?Relevant Lab Results: ? ? ?Additional Information ?SSN#: [id number]. Per patient, he has had three covid vaccinations. ? ?Ihor Gully, LCSW ? ? ? ? ?

## 2021-05-07 NOTE — H&P (Signed)
, ?History and Physical  ? ? ?Patient: Derek Foster:[id number] DOB: 11-16-1935 ?DOA: 05/06/2021 ?DOS: the patient was seen and examined on 05/07/2021 ?PCP: Celene Squibb, MD  ?Patient coming from: Home ? ?Chief Complaint:  ?Chief Complaint  ?Patient presents with  ? Weakness  ? ?HPI: Derek Foster is a 86 y.o. male with medical history significant of with history of coronary artery disease, essential hypertension, hypothyroidism mixed hyperlipidemia, NSTEMI, paroxysmal atrial fibrillation, and more presents to ED with a chief complaint of weakness.  Patient reports he is so weak that he could not stand up.  Home PT came today and put a belt on him and tried to stand him up but his legs were not able to hold him up.  There is no pain in the legs.  There is no numbness or tingling.  There is no asymmetric weakness.  Patient reports that he has a decrease in appetite but he has not been feeling good.  He cannot explain what he means when he says he has not been feeling good.  He denies nausea, vomiting, stomach pain, diarrhea.  He does admit to 3 days of constipation but did have a bowel movement while in the ED.  Patient denies back pain, bladder pain.  He has an indwelling Foley.  His urine in the Foley bag is brown, he thinks that that may have started yesterday, but he is not sure.  Patient has no other complaints. ? ?Patient does not smoke, does not drink alcohol, does not use illicit drugs.  He is vaccinated for COVID.  Patient is DNR. ?Review of Systems: As mentioned in the history of present illness. All other systems reviewed and are negative. ?Past Medical History:  ?Diagnosis Date  ? AKI (acute kidney injury) ([place]) 02/12/2019  ? CAD (coronary artery disease)   ? a. cath 01/27/2009 : s/p promus DES to LAD and medical managment of 70-80% mRCA  ? Chronic kidney disease, stage 3b ([place])   ? COVID-19   ? Essential hypertension   ? GI bleeding   ? Hematuria   ? Hyperthyroidism   ? Hypothyroidism   ? Mild  atherosclerosis of carotid artery   ? Mild pulmonary hypertension ([place])   ? Mixed hyperlipidemia   ? NSTEMI (non-ST elevated myocardial infarction) [facility]) 2011  ? PAF (paroxysmal atrial fibrillation) ([place])   ? ?Past Surgical History:  ?Procedure Laterality Date  ? BOWEL RESECTION N/A 06/28/2019  ? Procedure: SMALL BOWEL RESECTION;  Surgeon: Virl Cagey, MD;  Location: [facility];  Service: General;  Laterality: N/A;  ? CHOLECYSTECTOMY    ? CORONARY STENT PLACEMENT    ? CYSTOSCOPY/RETROGRADE/URETEROSCOPY Bilateral 03/09/2019  ? Procedure: CYSTOSCOPY, Clot Evacuation, Fulguration;  Surgeon: Festus Aloe, MD;  Location: [facility];  Service: Urology;  Laterality: Bilateral;  ? HERNIA REPAIR    ? LYSIS OF ADHESION N/A 06/28/2019  ? Procedure: LYSIS OF ADHESION;  Surgeon: Virl Cagey, MD;  Location: [facility];  Service: General;  Laterality: N/A;  ? VENTRAL HERNIA REPAIR N/A 06/28/2019  ? Procedure: HERNIA REPAIR VENTRAL ADULT WITH VICRYL MESH OVERLAY;  Surgeon: Virl Cagey, MD;  Location: [facility];  Service: General;  Laterality: N/A;  ? ?Social History:  reports that he quit smoking about 64 years ago. His smoking use included cigarettes. He has never used smokeless tobacco. He reports that he does not drink alcohol and does not use drugs. ? ?Allergies  ?Allergen Reactions  ? Indomethacin Anaphylaxis  ?  But tolerates ibuprofen, aleve  ? Iodine-131 Anaphylaxis  ? Lidocaine-Menthol   ?  Other reaction(s): Bradycardia  ? ? ?Family History  ?Problem Relation Age of Onset  ? Heart attack Father   ? Lung cancer Sister   ? ? ?Prior to Admission medications   ?Medication Sig Start Date End Date Taking? Authorizing Provider  ?acetaminophen (TYLENOL) 325 MG tablet Take 325 mg by mouth every 6 (six) hours as needed.   Yes [provider]  ?aspirin EC 81 MG tablet Take 81 mg by mouth daily. Swallow whole.   Yes [provider]  ?carvedilol (COREG) 6.25 MG tablet Take 1.5 tablets (9.375 mg total) by mouth 2  (two) times daily. 10/01/20  Yes Dunn, Nedra Hai, PA-C  ?Coenzyme Q10 200 MG capsule Take 200 mg by mouth daily.   Yes [provider]  ?diclofenac Sodium (VOLTAREN) 1 % GEL as needed.   Yes [provider]  ?finasteride (PROSCAR) 5 MG tablet Take 1 tablet (5 mg total) by mouth daily. 12/20/20  Yes Irine Seal, MD  ?furosemide (LASIX) 20 MG tablet Take 2 tablets (40 mg total) by mouth daily. 07/19/19  Yes Gerlene Fee, NP  ?gabapentin (NEURONTIN) 100 MG capsule Take 100 mg by mouth daily. 05/16/20  Yes [provider]  ?hydrALAZINE (APRESOLINE) 50 MG tablet Take 1 tablet (50 mg total) by mouth 3 (three) times daily. 10/01/20 05/06/21 Yes Dunn, Dayna N, PA-C  ?methimazole (TAPAZOLE) 10 MG tablet Take 2 tablets (20 mg total) by mouth 2 (two) times daily. 07/19/19  Yes Gerlene Fee, NP  ?Multiple Vitamin (MULTIVITAMIN) tablet Take 1 tablet by mouth daily.   Yes [provider]  ?nitroGLYCERIN (NITROSTAT) 0.4 MG SL tablet Place 1 tablet (0.4 mg total) under the tongue every 5 (five) minutes as needed. 07/19/19  Yes Gerlene Fee, NP  ?potassium chloride (KLOR-CON) 10 MEQ tablet Take 1 tablet (10 mEq total) by mouth every evening. ?Patient taking differently: Take 10 mEq by mouth daily. 07/19/19  Yes Gerlene Fee, NP  ?pravastatin (PRAVACHOL) 20 MG tablet Take 1 tablet (20 mg total) by mouth every evening. 10/04/20 05/06/21 Yes Imogene Burn, PA-C  ?tamsulosin (FLOMAX) 0.4 MG CAPS capsule Take 0.4 mg by mouth every morning. 04/10/21  Yes [provider]  ? ? ?Physical Exam: ?Vitals:  ? 05/06/21 1700 05/06/21 1830 05/06/21 1900 05/06/21 1945  ?BP: (!) 167/74 (!) 169/74 (!) 169/65 (!) 121/96  ?Pulse: 85 87 87 91  ?Resp: '15 19 19 '$ (!) 22  ?Temp:      ?SpO2: 97% 97% 98% 98%  ?Weight:      ?Height:      ? ?1.  General: ?Patient lying supine in bed,  no acute distress ?  ?2. Psychiatric: ?Alert and oriented x 3, flat affect and behavior is normal for situation, cooperative with  exam ?  ?3. Neurologic: ?Speech and language are normal, face is symmetric, moves all 4 extremities voluntarily, lower extremities are 3 out of 5 strength bilaterally, sensation is intact bilaterally ?  ?4. HEENMT:  ?Head is atraumatic, normocephalic, pupils reactive to light, neck is supple, trachea is midline, mucous membranes are moist ?  ?5. Respiratory : ?Lungs are clear to auscultation bilaterally without wheezing, rhonchi, rales, no cyanosis, no increase in work of breathing or accessory muscle use ?  ?6. Cardiovascular : ?Heart rate normal, rhythm is regular, no murmurs, rubs or gallops, significant peripheral edema including fluid-filled blisters but no weeping at this time,  peripheral pulses palpated ?  ?7. Gastrointestinal:  ?Abdomen is soft, nondistended, nontender to palpation bowel sounds active, no masses or organomegaly palpated ?  ?8. Skin:  ?Skin is warm, dry and intact without rashes, acute lesions, or ulcers on limited exam ?  ?9.Musculoskeletal:  ?No acute deformities or trauma, no asymmetry in tone, no peripheral edema, no tenderness to palpation in the extremities ? ?Data Reviewed: ?In the ED ?Temp 97.6, heart rate 81-91, respiratory rate 15-22, blood pressure 121/55-170/96, satting at 98% ?UA indicative of UTI, urine culture pending ?Chest x-ray shows no active disease ?EKG shows a heart rate of 86, unknown rhythm, QTc 475 ?Patient was given Rocephin ?Admission was requested for generalized weakness and placement with rehab ? ?Assessment and Plan: ?* Generalized weakness ?- Patient is so weak he cannot stand up ?-Home PT was not able to stand him up today ?-Check for nutritional deficiencies ?-Most likely will need rehab ?-Consult TOC ?-Consult PT OT ?-Continue to monitor ? ?Bilateral lower extremity edema ?- Continue Lasix 40 mg p.o. daily ?Continue to monitor ? ?PAF (paroxysmal atrial fibrillation) ([place]) ?- Continue aspirin and Coreg ? ?UTI (urinary tract infection) due to urinary  indwelling catheter ([facility]) ?- UA indicative of UTI ?-Indwelling Foley changed out ?-Urine culture pending ?-Rocephin started in continue Rocephin ?-No leukocytosis ?-Continue to monitor ? ?Protein-calorie malnutrition, severe

## 2021-05-07 NOTE — Assessment & Plan Note (Signed)
-   Continue Lasix 40 mg p.o. daily ?Continue to monitor ?

## 2021-05-07 NOTE — Assessment & Plan Note (Addendum)
-   UA indicative of UTI ?-Indwelling Foley changed 4/18 ?-4/19 CT abd--Foley catheter balloon is inflated within the prostate;  Focal soft tissue of the left lateral bladder wall ?-4/17 Urine culture = multiple organisms ?-continued Rocephin ?-05/08/21--coude catheter 18 Fr inserted ?-d/c with 4 more days cefdinir ?--patient made aware of bladder wall abnormality>>sent referral to for [facility] urology for outpt follow up ?

## 2021-05-07 NOTE — Evaluation (Signed)
Physical Therapy Evaluation ?Patient Details ?Name: Derek Foster ?MRN: [id number] ?DOB: 02/06/1935 ?Today's Date: 05/07/2021 ? ?History of Present Illness ? Derek Foster is a 86 y.o. male with medical history significant of with history of coronary artery disease, essential hypertension, hypothyroidism mixed hyperlipidemia, NSTEMI, paroxysmal atrial fibrillation, and more presents to ED with a chief complaint of weakness.  Patient reports he is so weak that he could not stand up.  Home PT came today and put a belt on him and tried to stand him up but his legs were not able to hold him up.  There is no pain in the legs.  There is no numbness or tingling.  There is no asymmetric weakness.  Patient reports that he has a decrease in appetite but he has not been feeling good.  He cannot explain what he means when he says he has not been feeling good.  He denies nausea, vomiting, stomach pain, diarrhea.  He does admit to 3 days of constipation but did have a bowel movement while in the ED.  Patient denies back pain, bladder pain.  He has an indwelling Foley.  His urine in the Foley bag is brown, he thinks that that may have started yesterday, but he is not sure.  Patient has no other complaints. ?  ?Clinical Impression ? Patient demonstrates slow labored movement for sitting up at bedside requiring HOB partially raised and use of bed rail with Mod assist, very unsteady on feet, at high risk for falls, limited to a few side steps before having to sit due to generalized weakness and poor standing balance.  Patient will benefit from continued skilled physical therapy in hospital and recommended venue below to increase strength, balance, endurance for safe ADLs and gait.  ?   ?   ? ?Recommendations for follow up therapy are one component of a multi-disciplinary discharge planning process, led by the attending physician.  Recommendations may be updated based on patient status, additional functional criteria and insurance  authorization. ? ?Follow Up Recommendations Skilled nursing-short term rehab (<3 hours/day) ? ?  ?Assistance Recommended at Discharge Intermittent Supervision/Assistance  ?Patient can return home with the following ? A lot of help with walking and/or transfers;A lot of help with bathing/dressing/bathroom;Help with stairs or ramp for entrance;Assistance with cooking/housework ? ?  ?Equipment Recommendations None recommended by PT  ?Recommendations for Other Services ?    ?  ?Functional Status Assessment Patient has had a recent decline in their functional status and demonstrates the ability to make significant improvements in function in a reasonable and predictable amount of time.  ? ?  ?Precautions / Restrictions Precautions ?Precautions: Fall ?Restrictions ?Weight Bearing Restrictions: No  ? ?  ? ?Mobility ? Bed Mobility ?Overal bed mobility: Needs Assistance ?Bed Mobility: Supine to Sit ?  ?  ?Supine to sit: Mod assist ?  ?  ?General bed mobility comments: as per OT notes ?  ? ?Transfers ?Overall transfer level: Needs assistance ?Equipment used: Rolling walker (2 wheels) ?Transfers: Sit to/from Stand, Bed to chair/wheelchair/BSC ?Sit to Stand: Mod assist ?  ?Step pivot transfers: Mod assist, Max assist ?  ?  ?  ?General transfer comment: as per OT notes ?  ? ?Ambulation/Gait ?Ambulation/Gait assistance: Mod assist, Max assist ?Gait Distance (Feet): 5 Feet ?Assistive device: Rolling walker (2 wheels) ?Gait Pattern/deviations: Decreased step length - right, Decreased step length - left, Decreased stride length, Trunk flexed ?Gait velocity: decreased ?  ?  ?General Gait Details: limited to few  slow labored side steps due to fatigue and poor standing balance ? ?Stairs ?  ?  ?  ?  ?  ? ?Wheelchair Mobility ?  ? ?Modified Rankin (Stroke Patients Only) ?  ? ?  ? ?Balance Overall balance assessment: Needs assistance ?Sitting-balance support: Feet supported, No upper extremity supported ?Sitting balance-Leahy Scale:  Fair ?Sitting balance - Comments: fair/good seated at EOB ?  ?Standing balance support: Reliant on assistive device for balance, During functional activity, Bilateral upper extremity supported ?Standing balance-Leahy Scale: Poor ?Standing balance comment: using RW ?  ?  ?  ?  ?  ?  ?  ?  ?  ?  ?  ?   ? ? ? ?Pertinent Vitals/Pain Pain Assessment ?Pain Assessment: 0-10 ?Pain Score: 3  ?Pain Location: area of indwlling folley catheter ?Pain Descriptors / Indicators: Burning ?Pain Intervention(s): Limited activity within patient's tolerance, Monitored during session  ? ? ?Home Living Family/patient expects to be discharged to:: Private residence ?Living Arrangements: Children ?Available Help at Discharge: Available PRN/intermittently ?Type of Home: House ?Home Access: Ramped entrance ?  ?  ?  ?Home Layout: Two level;Other (Comment);Able to live on main level with bedroom/bathroom ?Home Equipment: Rollator (4 wheels);BSC/3in1;Grab bars - toilet;Grab bars - tub/shower;Wheelchair - manual;Other (comment) ?Additional Comments: Pt reports assist from aide in last month.  ?  ?Prior Function Prior Level of Function : Needs assist ?  ?  ?  ?Physical Assist : Mobility (physical);ADLs (physical) ?Mobility (physical): Bed mobility;Transfers;Gait;Stairs ?  ?Mobility Comments: Pt reports use of rollator at baseline. Unable to stand with PT leading to hospital admission. Pt mobility has decreased significantly in the past month. Pt used to drive 2 to 3 months ago. ?ADLs Comments: Pt was indepndent for ADL's prior to a month ago. Now pt requries assist from aide for bathing, dressing, and toileting. Indepndent grooming and feeding. Assisted by family for IADL's. ?  ? ? ?Hand Dominance  ? Dominant Hand: Right ? ?  ?Extremity/Trunk Assessment  ? Upper Extremity Assessment ?Upper Extremity Assessment: Defer to OT evaluation ?  ? ?Lower Extremity Assessment ?Lower Extremity Assessment: Generalized weakness ?  ? ?Cervical / Trunk  Assessment ?Cervical / Trunk Assessment: Normal  ?Communication  ? Communication: No difficulties  ?Cognition Arousal/Alertness: Awake/alert ?Behavior During Therapy: [facility] for tasks assessed/performed ?Overall Cognitive Status: Within Functional Limits for tasks assessed ?  ?  ?  ?  ?  ?  ?  ?  ?  ?  ?  ?  ?  ?  ?  ?  ?  ?  ?  ? ?  ?General Comments   ? ?  ?Exercises    ? ?Assessment/Plan  ?  ?PT Assessment Patient needs continued PT services  ?PT Problem List Decreased strength;Decreased activity tolerance;Decreased balance;Decreased mobility ? ?   ?  ?PT Treatment Interventions DME instruction;Stair training;Gait training;Functional mobility training;Therapeutic activities;Therapeutic exercise;Patient/family education;Balance training   ? ?PT Goals (Current goals can be found in the Care Plan section)  ?Acute Rehab PT Goals ?Patient Stated Goal: return home after rehab ?PT Goal Formulation: With patient ?Time For Goal Achievement: 05/21/21 ?Potential to Achieve Goals: Good ? ?  ?Frequency Min 3X/week ?  ? ? ?Co-evaluation PT/OT/SLP Co-Evaluation/Treatment: Yes ?Reason for Co-Treatment: Complexity of the patient's impairments (multi-system involvement);To address functional/ADL transfers ?PT goals addressed during session: Mobility/safety with mobility;Balance;Proper use of DME ?  ?  ? ? ?  ?AM-PAC PT "6 Clicks" Mobility  ?Outcome Measure Help needed turning from your back to  your side while in a flat bed without using bedrails?: A Little ?Help needed moving from lying on your back to sitting on the side of a flat bed without using bedrails?: A Lot ?Help needed moving to and from a bed to a chair (including a wheelchair)?: A Lot ?Help needed standing up from a chair using your arms (e.g., wheelchair or bedside chair)?: A Lot ?Help needed to walk in hospital room?: A Lot ?Help needed climbing 3-5 steps with a railing? : Total ?6 Click Score: 12 ? ?  ?End of Session Equipment Utilized During Treatment: Gait  belt ?Activity Tolerance: Patient tolerated treatment well;Patient limited by fatigue ?Patient left: in chair;with call bell/phone within reach ?Nurse Communication: Mobility status ?PT Visit Diagnosis: Unsteadiness on feet (R26.81

## 2021-05-07 NOTE — Assessment & Plan Note (Addendum)
-   Patient is so weak he cannot stand up ?-Home PT was not able to stand him up ?-supplement B12--lower limit of normal ?-supplement folate ?-Consult TOC ?-Consult PT>>SNF ?-Continue to monitor ?

## 2021-05-07 NOTE — Plan of Care (Signed)
?  Problem: Acute Rehab PT Goals(only PT should resolve) ?Goal: Pt Will Go Supine/Side To Sit ?Outcome: Progressing ?Flowsheets (Taken 05/07/2021 1404) ?Pt will go Supine/Side to Sit: with minimal assist ?Goal: Patient Will Transfer Sit To/From Stand ?Outcome: Progressing ?Flowsheets (Taken 05/07/2021 1404) ?Patient will transfer sit to/from stand: ? with minimal assist ? with moderate assist ?Goal: Pt Will Transfer Bed To Chair/Chair To Bed ?Outcome: Progressing ?Flowsheets (Taken 05/07/2021 1404) ?Pt will Transfer Bed to Chair/Chair to Bed: ? with min assist ? with mod assist ?Goal: Pt Will Ambulate ?Outcome: Progressing ?Flowsheets (Taken 05/07/2021 1404) ?Pt will Ambulate: ? 15 feet ? with moderate assist ? with rolling walker ?  ?2:05 PM, 05/07/21 ?Lonell Grandchild, MPT ?Physical Therapist with [place] ?[facility] ?[phone] office ?2003 mobile phone ? ?

## 2021-05-07 NOTE — Assessment & Plan Note (Signed)
-   Albumin 2.9 ?-Encourage p.o. intake ?-Continue to monitor ?

## 2021-05-07 NOTE — Assessment & Plan Note (Addendum)
-   Continue aspirin and Coreg ?-rate controlled ?

## 2021-05-07 NOTE — Assessment & Plan Note (Signed)
Continue statin. 

## 2021-05-08 ENCOUNTER — Inpatient Hospital Stay (HOSPITAL_COMMUNITY): Payer: Medicare Other

## 2021-05-08 DIAGNOSIS — I129 Hypertensive chronic kidney disease with stage 1 through stage 4 chronic kidney disease, or unspecified chronic kidney disease: Secondary | ICD-10-CM | POA: Diagnosis not present

## 2021-05-08 DIAGNOSIS — Z20822 Contact with and (suspected) exposure to covid-19: Secondary | ICD-10-CM | POA: Diagnosis not present

## 2021-05-08 DIAGNOSIS — N3001 Acute cystitis with hematuria: Secondary | ICD-10-CM | POA: Diagnosis not present

## 2021-05-08 DIAGNOSIS — E039 Hypothyroidism, unspecified: Secondary | ICD-10-CM | POA: Diagnosis not present

## 2021-05-08 DIAGNOSIS — Z6841 Body Mass Index (BMI) 40.0 and over, adult: Secondary | ICD-10-CM | POA: Diagnosis not present

## 2021-05-08 DIAGNOSIS — Z801 Family history of malignant neoplasm of trachea, bronchus and lung: Secondary | ICD-10-CM | POA: Diagnosis not present

## 2021-05-08 DIAGNOSIS — R310 Gross hematuria: Secondary | ICD-10-CM | POA: Diagnosis not present

## 2021-05-08 DIAGNOSIS — I10 Essential (primary) hypertension: Secondary | ICD-10-CM | POA: Diagnosis not present

## 2021-05-08 DIAGNOSIS — I2510 Atherosclerotic heart disease of native coronary artery without angina pectoris: Secondary | ICD-10-CM | POA: Diagnosis not present

## 2021-05-08 DIAGNOSIS — N390 Urinary tract infection, site not specified: Secondary | ICD-10-CM | POA: Diagnosis present

## 2021-05-08 DIAGNOSIS — I272 Pulmonary hypertension, unspecified: Secondary | ICD-10-CM | POA: Diagnosis not present

## 2021-05-08 DIAGNOSIS — Z8249 Family history of ischemic heart disease and other diseases of the circulatory system: Secondary | ICD-10-CM | POA: Diagnosis not present

## 2021-05-08 DIAGNOSIS — Y846 Urinary catheterization as the cause of abnormal reaction of the patient, or of later complication, without mention of misadventure at the time of the procedure: Secondary | ICD-10-CM | POA: Diagnosis not present

## 2021-05-08 DIAGNOSIS — E782 Mixed hyperlipidemia: Secondary | ICD-10-CM | POA: Diagnosis not present

## 2021-05-08 DIAGNOSIS — I480 Paroxysmal atrial fibrillation: Secondary | ICD-10-CM | POA: Diagnosis not present

## 2021-05-08 DIAGNOSIS — Z66 Do not resuscitate: Secondary | ICD-10-CM | POA: Diagnosis not present

## 2021-05-08 DIAGNOSIS — Z79899 Other long term (current) drug therapy: Secondary | ICD-10-CM | POA: Diagnosis not present

## 2021-05-08 DIAGNOSIS — T83518A Infection and inflammatory reaction due to other urinary catheter, initial encounter: Secondary | ICD-10-CM | POA: Diagnosis not present

## 2021-05-08 DIAGNOSIS — Z7982 Long term (current) use of aspirin: Secondary | ICD-10-CM | POA: Diagnosis not present

## 2021-05-08 DIAGNOSIS — N1832 Chronic kidney disease, stage 3b: Secondary | ICD-10-CM | POA: Diagnosis not present

## 2021-05-08 DIAGNOSIS — I252 Old myocardial infarction: Secondary | ICD-10-CM | POA: Diagnosis not present

## 2021-05-08 DIAGNOSIS — E0590 Thyrotoxicosis, unspecified without thyrotoxic crisis or storm: Secondary | ICD-10-CM | POA: Diagnosis not present

## 2021-05-08 DIAGNOSIS — N3289 Other specified disorders of bladder: Secondary | ICD-10-CM | POA: Diagnosis not present

## 2021-05-08 DIAGNOSIS — R319 Hematuria, unspecified: Secondary | ICD-10-CM

## 2021-05-08 DIAGNOSIS — K439 Ventral hernia without obstruction or gangrene: Secondary | ICD-10-CM | POA: Diagnosis not present

## 2021-05-08 DIAGNOSIS — Z9049 Acquired absence of other specified parts of digestive tract: Secondary | ICD-10-CM | POA: Diagnosis not present

## 2021-05-08 DIAGNOSIS — N179 Acute kidney failure, unspecified: Secondary | ICD-10-CM | POA: Diagnosis not present

## 2021-05-08 DIAGNOSIS — L89322 Pressure ulcer of left buttock, stage 2: Secondary | ICD-10-CM | POA: Diagnosis not present

## 2021-05-08 DIAGNOSIS — R531 Weakness: Secondary | ICD-10-CM | POA: Diagnosis not present

## 2021-05-08 DIAGNOSIS — T83511A Infection and inflammatory reaction due to indwelling urethral catheter, initial encounter: Secondary | ICD-10-CM

## 2021-05-08 DIAGNOSIS — Z87891 Personal history of nicotine dependence: Secondary | ICD-10-CM | POA: Diagnosis not present

## 2021-05-08 LAB — URINE CULTURE: Culture: >=100000 — AB

## 2021-05-08 LAB — BASIC METABOLIC PANEL
Anion gap: 6 (ref 5–15)
BUN: 26 mg/dL — ABNORMAL HIGH (ref 8–23)
CO2: 26 mmol/L (ref 22–32)
Calcium: 7.7 mg/dL — ABNORMAL LOW (ref 8.9–10.3)
Chloride: 108 mmol/L (ref 98–111)
Creatinine, Ser: 1.51 mg/dL — ABNORMAL HIGH (ref 0.61–1.24)
GFR, Estimated: 45 mL/min — ABNORMAL LOW (ref >60)
Glucose, Bld: 113 mg/dL — ABNORMAL HIGH (ref 70–99)
Potassium: 3.5 mmol/L (ref 3.5–5.1)
Sodium: 140 mmol/L (ref 135–145)

## 2021-05-08 LAB — URINALYSIS, COMPLETE (UACMP) WITH MICROSCOPIC
Bilirubin Urine: NEGATIVE
Budding Yeast: NONE SEEN
Glucose, UA: NEGATIVE mg/dL
Ketones, ur: NEGATIVE mg/dL
Nitrite: NEGATIVE
Protein, ur: 100 mg/dL — AB
RBC / HPF: >50 RBC/hpf — ABNORMAL HIGH (ref 0–5)
Specific Gravity, Urine: 1.011 (ref 1.005–1.030)
WBC, UA: >50 WBC/hpf — ABNORMAL HIGH (ref 0–5)
pH: 7 (ref 5.0–8.0)

## 2021-05-08 LAB — CBC
HCT: 25.8 % — ABNORMAL LOW (ref 39.0–52.0)
Hemoglobin: 8 g/dL — ABNORMAL LOW (ref 13.0–17.0)
MCH: 28.6 pg (ref 26.0–34.0)
MCHC: 31 g/dL (ref 30.0–36.0)
MCV: 92.1 fL (ref 80.0–100.0)
Platelets: 176 10*3/uL (ref 150–400)
RBC: 2.8 MIL/uL — ABNORMAL LOW (ref 4.22–5.81)
RDW: 13.6 % (ref 11.5–15.5)
WBC: 7 10*3/uL (ref 4.0–10.5)
nRBC: 0 % (ref 0.0–0.2)

## 2021-05-08 LAB — T3, FREE: T3, Free: 1.8 pg/mL — ABNORMAL LOW (ref 2.0–4.4)

## 2021-05-08 MED ORDER — FOLIC ACID 1 MG PO TABS
1.0000 mg | ORAL_TABLET | Freq: Every day | ORAL | Status: DC
  Administered 2021-05-08 – 2021-05-09 (×2): 1 mg via ORAL
  Filled 2021-05-08 (×2): qty 1

## 2021-05-08 MED ORDER — MELATONIN 3 MG PO TABS
6.0000 mg | ORAL_TABLET | Freq: Once | ORAL | Status: AC
  Administered 2021-05-08: 6 mg via ORAL
  Filled 2021-05-08: qty 2

## 2021-05-08 MED ORDER — BENZONATATE 100 MG PO CAPS
100.0000 mg | ORAL_CAPSULE | Freq: Three times a day (TID) | ORAL | Status: DC
  Administered 2021-05-08 – 2021-05-09 (×3): 100 mg via ORAL
  Filled 2021-05-08 (×3): qty 1

## 2021-05-08 MED ORDER — VITAMIN B-12 100 MCG PO TABS
500.0000 ug | ORAL_TABLET | Freq: Every day | ORAL | Status: DC
  Administered 2021-05-08 – 2021-05-09 (×2): 500 ug via ORAL
  Filled 2021-05-08 (×2): qty 5

## 2021-05-08 NOTE — Assessment & Plan Note (Addendum)
Suprapubic pain with new hematuria ?-Indwelling Foley changed 4/18 ?-4/19 CT abd--Foley catheter balloon is inflated within the prostate;  Focal soft tissue of the left lateral bladder wall ?-4/17 Urine culture = multiple organisms ?-continued Rocephin ?-05/08/21--coude catheter 18 Fr inserted ?-d/c with 4 more days cefdinir ?--patient made aware of bladder wall abnormality>>sent referral to for [facility] urology for outpt follow up ?--Hgb stable, hematuria improved ?

## 2021-05-08 NOTE — TOC Progression Note (Addendum)
Transition of Care (TOC) - Progression Note  ? ? ?Patient Details  ?Name: Derek Foster ?MRN: [id number] ?Date of Birth: 14-Jan-1936 ? ?Transition of Care (TOC) CM/SW Contact  ?Salome Arnt, LCSW ?Phone Number: ?05/08/2021, 9:13 AM ? ?Clinical Narrative:  Pt accepts bed at [facility] [place]. CMA updating authorization.   ? ?Addendum: Per [facility], authorization received. MD anticipates d/c tomorrow. [place] updated. TOC will continue to follow.  ? ? ? ?Expected Discharge Plan: [place] ?Barriers to Discharge: Continued Medical Work up ? ?Expected Discharge Plan and Services ?Expected Discharge Plan: [place] ?  ?  ?Post Acute Care Choice: [place] ?Living arrangements for the past 2 months: [place] ?                ?  ?  ?  ?  ?  ?  ?  ?  ?  ?  ? ? ?Social Determinants of Health (SDOH) Interventions ?  ? ?Readmission Risk Interventions ? ?  06/30/2019  ?  3:30 PM 03/11/2019  ?  1:35 PM  ?Readmission Risk Prevention Plan  ?Transportation Screening Complete Complete  ?PCP or Specialist Appt within 3-5 Days Not Complete Not Complete  ?Not Complete comments SNF is expected discharge plan plan for SNF  ?[place] or Home Care Consult Complete Not Complete  ?[place] or Home Care Consult comments  plan for SNF  ?Social Work Consult for [place] Planning/Counseling Complete Complete  ?Palliative Care Screening Not Applicable Complete  ?Medication Review Press photographer) Complete Referral to Pharmacy  ? ? ?

## 2021-05-08 NOTE — Progress Notes (Signed)
?  ?       ?PROGRESS NOTE ? ?Derek Foster WFU:[id number] DOB: Jan 30, 1935 DOA: 05/06/2021 ?PCP: Celene Squibb, MD ? ?Brief History:  ? ? ? 86 y.o. male with medical history significant of with history of coronary artery disease, essential hypertension,HLD,  hypothyroidism mixed hyperlipidemia, NSTEMI, paroxysmal atrial fibrillation, and chronic foley presents to ED with a chief complaint of weakness.  Patient reports he is so weak that he could not stand up.  This has been progressive over 6 weeks. Home PT came on day of admssion and put a belt on him and tried to stand him up but his legs were not able to hold him up.  There is no pain in the legs.  There is no numbness or tingling.  There is no asymmetric weakness.  Patient reports that he has a decrease in appetite but he has not been feeling good.  He cannot explain what he means when he says he has not been feeling good.  He denies nausea, vomiting, stomach pain, diarrhea.  He does admit to 3 days of constipation but did have a bowel movement while in the ED.  Patient denies back pain, bladder pain.  He has an indwelling Foley.  His urine in the Foley bag is brown, he thinks that that may have started yesterday, but he is not sure.  Patient has no other complaints.  Foley was changed on 04/06/21 prior to admission. ?  ?Patient does not smoke, does not drink alcohol, does not use illicit drugs.  He is vaccinated for COVID.  Patient is DNR.  ? ? ? ?Assessment and Plan: ?* Generalized weakness ?- Patient is so weak he cannot stand up ?-Home PT was not able to stand him up ?-supplement B12--lower limit of normal ?-supplement folate ?-Consult TOC ?-Consult PT>>SNF ?-Continue to monitor ? ?Hematuria ?Suprapubic pain with new hematuria ?CT abd/pelvis ? ?PAF (paroxysmal atrial fibrillation) ([place]) ?- Continue aspirin and Coreg ?-rate controlled ? ?UTI (urinary tract infection) due to urinary indwelling catheter (HCC) ?- UA indicative of UTI ?-Indwelling Foley changed  4/18 ?-Urine culture = multiple organisms ?-continue Rocephin ?-No leukocytosis ? ?Essential hypertension ?- Continue Coreg ?-hold hydralazine ?Continue to monitor ? ?Mixed hyperlipidemia ?Continue statin ? ?Hyperthyroidism ?- Continue methimazole ?-Free T4 = 1.24  ?-Continue to monitor ? ? ? ? ? ? ?Family Communication:  no Family at bedside ? ?Consultants:  none ? ?Code Status:  DNR ? ?DVT Prophylaxis:  SCDs ? ? ?Procedures: ?As Listed in Progress Note Above ? ?Antibiotics: ?Ceftriaxone 4/17>> ? ?RN Pressure Injury Documentation: ?Pressure Injury 03/12/19 Buttocks Left Stage 2 -  Partial thickness loss of dermis presenting as a shallow open injury with a red, pink wound bed without slough. (Active)  ?03/12/19 0924  ?Location: Buttocks  ?Location Orientation: Left  ?Staging: Stage 2 -  Partial thickness loss of dermis presenting as a shallow open injury with a red, pink wound bed without slough.  ?Wound Description (Comments):   ?Present on Admission: Yes  ?Dressing Type Foam - Lift dressing to assess site every shift 05/08/21 0845  ? ? ? ? ? ? ?Subjective: ?Had suprapubic pain last night, now with hematuria.  Patient denies fevers, chills, headache, chest pain, dyspnea, nausea, vomiting, diarrhea, abdominal pain, dhematuria, hematochezia, and melena. ? ? ?Objective: ?Vitals:  ? 05/07/21 1510 05/07/21 2024 05/08/21 0417 05/08/21 0953  ?BP: (!) 140/44 (!) 128/42 (!) 133/46 102/83  ?Pulse: 81 83 81 77  ?Resp: '17 19 17 18  '$ ?  Temp: 98.6 ?F (37 ?C) 98.8 ?F (37.1 ?C) 98.7 ?F (37.1 ?C) 99.4 ?F (37.4 ?C)  ?TempSrc:  Oral Oral Oral  ?SpO2: 98% 97% 96% 93%  ?Weight:      ?Height:      ? ? ?Intake/Output Summary (Last 24 hours) at 05/08/2021 1131 ?Last data filed at 05/08/2021 1000 ?Gross per 24 hour  ?Intake 580 ml  ?Output 1725 ml  ?Net -1145 ml  ? ?Weight change:  ?Exam: ? ?General:  Pt is alert, follows commands appropriately, not in acute distress ?HEENT: No icterus, No thrush, No neck mass, [place]/AT ?Cardiovascular: RRR,  S1/S2, no rubs, no gallops ?Respiratory: CTA bilaterally, no wheezing, no crackles, no rhonchi ?Abdomen: Soft/+BS, non tender, non distended, no guarding ?Extremities: No edema, No lymphangitis, No petechiae, No rashes, no synovitis ? ? ?Data Reviewed: ?I have personally reviewed following labs and imaging studies ?Basic Metabolic Panel: ?Recent Labs  ?Lab 05/06/21 ?1452 05/07/21 ?0416 05/08/21 ?0540  ?NA 138 140 140  ?K 4.3 3.9 3.5  ?CL 105 108 108  ?CO2 '24 25 26  '$ ?GLUCOSE 100* 107* 113*  ?BUN 28* 29* 26*  ?CREATININE 1.69* 1.60* 1.51*  ?CALCIUM 8.3* 7.9* 7.7*  ?MG  --  2.2  --   ? ?Liver Function Tests: ?Recent Labs  ?Lab 05/06/21 ?1452 05/07/21 ?0416  ?AST 34 26  ?ALT 23 18  ?ALKPHOS 198* 172*  ?BILITOT 0.6 0.2*  ?PROT 6.7 5.6*  ?ALBUMIN 2.9* 2.4*  ? ?No results for input(s): LIPASE, AMYLASE in the last 168 hours. ?No results for input(s): AMMONIA in the last 168 hours. ?Coagulation Profile: ?No results for input(s): INR, PROTIME in the last 168 hours. ?CBC: ?Recent Labs  ?Lab 05/06/21 ?1452 05/07/21 ?0416 05/08/21 ?0540  ?WBC 8.1 7.7 7.0  ?NEUTROABS  --  5.0  --   ?HGB 9.4* 8.6* 8.0*  ?HCT 29.2* 26.7* 25.8*  ?MCV 92.1 92.1 92.1  ?PLT 215 203 176  ? ?Cardiac Enzymes: ?No results for input(s): CKTOTAL, CKMB, CKMBINDEX, TROPONINI in the last 168 hours. ?BNP: ?Invalid input(s): POCBNP ?CBG: ?Recent Labs  ?Lab 05/06/21 ?1536  ?GLUCAP 102*  ? ?HbA1C: ?No results for input(s): HGBA1C in the last 72 hours. ?Urine analysis: ?   ?Component Value Date/Time  ? COLORURINE YELLOW 05/06/2021 1810  ? APPEARANCEUR TURBID (A) 05/06/2021 1810  ? LABSPEC 1.013 05/06/2021 1810  ? PHURINE 6.0 05/06/2021 1810  ? GLUCOSEU NEGATIVE 05/06/2021 1810  ? HGBUR MODERATE (A) 05/06/2021 1810  ? [place] NEGATIVE 05/06/2021 1810  ? [place] NEGATIVE 05/06/2021 1810  ? PROTEINUR 100 (A) 05/06/2021 1810  ? NITRITE NEGATIVE 05/06/2021 1810  ? LEUKOCYTESUR LARGE (A) 05/06/2021 1810  ? ?Sepsis  Labs: ?'@LABRCNTIP'$ (procalcitonin:4,lacticidven:4) ?) ?Recent Results (from the past 240 hour(s))  ?Urine Culture     Status: Abnormal  ? Collection Time: 05/06/21  6:10 PM  ? Specimen: Urine, Clean Catch  ?Result Value Ref Range Status  ? Specimen Description   Final  ?  URINE, CLEAN CATCH ?Performed at [facility], [address] [place], [place] [address] ?  ? Special Requests   Final  ?  NONE ?Performed at [facility], [address] [place], [place] [address] ?  ? Culture (A)  Final  ?  >=100,000 COLONIES/mL MULTIPLE SPECIES PRESENT, SUGGEST RECOLLECTION  ? Report Status 05/08/2021 FINAL  Final  ?  ? ?Scheduled Meds: ? aspirin EC  81 mg Oral Daily  ? carvedilol  9.375 mg Oral BID  ? Chlorhexidine Gluconate Cloth  6 each Topical Daily  ?  finasteride  5 mg Oral Daily  ? furosemide  40 mg Oral Daily  ? gabapentin  100 mg Oral Daily  ? guaiFENesin  600 mg Oral BID  ? heparin  5,000 Units Subcutaneous Q8H  ? hydrALAZINE  50 mg Oral TID  ? methimazole  20 mg Oral BID  ? pravastatin  20 mg Oral QPM  ? tamsulosin  0.4 mg Oral q morning  ? ?Continuous Infusions: ? cefTRIAXone (ROCEPHIN)  IV Stopped (05/07/21 1808)  ? ? ?Procedures/Studies: ?US PELVIS LIMITED (TRANSABDOMINAL ONLY) ? ?Result Date: 05/07/2021 ?CLINICAL DATA:  Evaluate position of Foley catheter. EXAM: LIMITED ULTRASOUND OF PELVIS TECHNIQUE: Limited transabdominal ultrasound examination of the pelvis was performed. COMPARISON:  None. FINDINGS: The bladder is decompressed. In the region the bladder the Foley catheter balloon is demonstrated. This is likely in the bladder. IMPRESSION: Foley catheter appears to be in the central low pelvis in the region of the decompressed bladder. For definitive localization a pelvic CT scan is suggested. Electronically Signed   By: Marijo Sanes M.D.   On: 05/07/2021 13:22  ? ?DG Chest Port 1 View ? ?Result Date: 05/06/2021 ?CLINICAL DATA:  Weakness, fell EXAM: PORTABLE CHEST 1 VIEW COMPARISON:  07/02/2019 FINDINGS: The heart  size and mediastinal contours are within normal limits. Both lungs are clear. The visualized skeletal structures are unremarkable. IMPRESSION: No active disease. Electronically Signed   By: Randa Ngo M.D.   On: 05/06/2021 18:39   ? ?Orson Eva, DO  ?[facility]

## 2021-05-08 NOTE — Progress Notes (Signed)
Upon assessment, blood noted around patient's foley catheter. Patient had no complaints of pain. MD was notified. Received verbal order to hold heparin, MD placed order for abdominal CT. ? ?Critical result  ?CT showed foley balloon was inflated in the patient's prostate. Notified Tat, MD. Received verbal orders to replace foley with 18 fr coude. Foley replaced, no complaints of pain or discomfort during insertion.  ? ? ?

## 2021-05-08 NOTE — Hospital Course (Signed)
? ?   86 y.o. male with medical history significant of with history of coronary artery disease, essential hypertension,HLD,  hypothyroidism mixed hyperlipidemia, NSTEMI, paroxysmal atrial fibrillation, and chronic foley presents to ED with a chief complaint of weakness.  Patient reports he is so weak that he could not stand up.  This has been progressive over 6 weeks. Home PT came on day of admssion and put a belt on him and tried to stand him up but his legs were not able to hold him up.  There is no pain in the legs.  There is no numbness or tingling.  There is no asymmetric weakness.  Patient reports that he has a decrease in appetite but he has not been feeling good.  He cannot explain what he means when he says he has not been feeling good.  He denies nausea, vomiting, stomach pain, diarrhea.  He does admit to 3 days of constipation but did have a bowel movement while in the ED.  Patient denies back pain, bladder pain.  He has an indwelling Foley.  His urine in the Foley bag is brown, he thinks that that may have started yesterday, but he is not sure.  Patient has no other complaints.  Foley was changed on 04/06/21 prior to admission. ?  ?Patient does not smoke, does not drink alcohol, does not use illicit drugs.  He is vaccinated for COVID.  Patient is DNR. ?

## 2021-05-09 DIAGNOSIS — T83518A Infection and inflammatory reaction due to other urinary catheter, initial encounter: Secondary | ICD-10-CM | POA: Diagnosis not present

## 2021-05-09 DIAGNOSIS — N401 Enlarged prostate with lower urinary tract symptoms: Secondary | ICD-10-CM | POA: Diagnosis not present

## 2021-05-09 DIAGNOSIS — N179 Acute kidney failure, unspecified: Secondary | ICD-10-CM

## 2021-05-09 DIAGNOSIS — E039 Hypothyroidism, unspecified: Secondary | ICD-10-CM | POA: Diagnosis not present

## 2021-05-09 DIAGNOSIS — D649 Anemia, unspecified: Secondary | ICD-10-CM | POA: Diagnosis not present

## 2021-05-09 DIAGNOSIS — I2510 Atherosclerotic heart disease of native coronary artery without angina pectoris: Secondary | ICD-10-CM | POA: Diagnosis not present

## 2021-05-09 DIAGNOSIS — K219 Gastro-esophageal reflux disease without esophagitis: Secondary | ICD-10-CM | POA: Diagnosis not present

## 2021-05-09 DIAGNOSIS — M545 Low back pain, unspecified: Secondary | ICD-10-CM | POA: Diagnosis not present

## 2021-05-09 DIAGNOSIS — T83091A Other mechanical complication of indwelling urethral catheter, initial encounter: Secondary | ICD-10-CM | POA: Diagnosis not present

## 2021-05-09 DIAGNOSIS — E43 Unspecified severe protein-calorie malnutrition: Secondary | ICD-10-CM | POA: Diagnosis not present

## 2021-05-09 DIAGNOSIS — T83098A Other mechanical complication of other indwelling urethral catheter, initial encounter: Secondary | ICD-10-CM | POA: Diagnosis not present

## 2021-05-09 DIAGNOSIS — Z7982 Long term (current) use of aspirin: Secondary | ICD-10-CM | POA: Diagnosis not present

## 2021-05-09 DIAGNOSIS — N390 Urinary tract infection, site not specified: Secondary | ICD-10-CM | POA: Diagnosis not present

## 2021-05-09 DIAGNOSIS — Z87891 Personal history of nicotine dependence: Secondary | ICD-10-CM | POA: Diagnosis not present

## 2021-05-09 DIAGNOSIS — E0789 Other specified disorders of thyroid: Secondary | ICD-10-CM | POA: Diagnosis not present

## 2021-05-09 DIAGNOSIS — E785 Hyperlipidemia, unspecified: Secondary | ICD-10-CM | POA: Diagnosis not present

## 2021-05-09 DIAGNOSIS — N3001 Acute cystitis with hematuria: Secondary | ICD-10-CM | POA: Diagnosis not present

## 2021-05-09 DIAGNOSIS — M6281 Muscle weakness (generalized): Secondary | ICD-10-CM | POA: Diagnosis not present

## 2021-05-09 DIAGNOSIS — M5417 Radiculopathy, lumbosacral region: Secondary | ICD-10-CM | POA: Diagnosis not present

## 2021-05-09 DIAGNOSIS — M5416 Radiculopathy, lumbar region: Secondary | ICD-10-CM | POA: Diagnosis not present

## 2021-05-09 DIAGNOSIS — I129 Hypertensive chronic kidney disease with stage 1 through stage 4 chronic kidney disease, or unspecified chronic kidney disease: Secondary | ICD-10-CM | POA: Diagnosis not present

## 2021-05-09 DIAGNOSIS — N1832 Chronic kidney disease, stage 3b: Secondary | ICD-10-CM | POA: Diagnosis not present

## 2021-05-09 DIAGNOSIS — I10 Essential (primary) hypertension: Secondary | ICD-10-CM | POA: Diagnosis not present

## 2021-05-09 DIAGNOSIS — Z95828 Presence of other vascular implants and grafts: Secondary | ICD-10-CM | POA: Diagnosis not present

## 2021-05-09 DIAGNOSIS — N138 Other obstructive and reflux uropathy: Secondary | ICD-10-CM | POA: Diagnosis not present

## 2021-05-09 DIAGNOSIS — Z79899 Other long term (current) drug therapy: Secondary | ICD-10-CM | POA: Diagnosis not present

## 2021-05-09 DIAGNOSIS — R531 Weakness: Secondary | ICD-10-CM | POA: Diagnosis not present

## 2021-05-09 DIAGNOSIS — R9341 Abnormal radiologic findings on diagnostic imaging of renal pelvis, ureter, or bladder: Secondary | ICD-10-CM | POA: Diagnosis not present

## 2021-05-09 DIAGNOSIS — I480 Paroxysmal atrial fibrillation: Secondary | ICD-10-CM | POA: Diagnosis not present

## 2021-05-09 DIAGNOSIS — T83511D Infection and inflammatory reaction due to indwelling urethral catheter, subsequent encounter: Secondary | ICD-10-CM | POA: Diagnosis not present

## 2021-05-09 DIAGNOSIS — Z741 Need for assistance with personal care: Secondary | ICD-10-CM | POA: Diagnosis not present

## 2021-05-09 DIAGNOSIS — Z8616 Personal history of COVID-19: Secondary | ICD-10-CM | POA: Diagnosis not present

## 2021-05-09 LAB — BASIC METABOLIC PANEL
Anion gap: 6 (ref 5–15)
BUN: 20 mg/dL (ref 8–23)
CO2: 25 mmol/L (ref 22–32)
Calcium: 7.7 mg/dL — ABNORMAL LOW (ref 8.9–10.3)
Chloride: 106 mmol/L (ref 98–111)
Creatinine, Ser: 1.39 mg/dL — ABNORMAL HIGH (ref 0.61–1.24)
GFR, Estimated: 50 mL/min — ABNORMAL LOW (ref >60)
Glucose, Bld: 117 mg/dL — ABNORMAL HIGH (ref 70–99)
Potassium: 3.4 mmol/L — ABNORMAL LOW (ref 3.5–5.1)
Sodium: 137 mmol/L (ref 135–145)

## 2021-05-09 LAB — CBC
HCT: 25.7 % — ABNORMAL LOW (ref 39.0–52.0)
Hemoglobin: 8.4 g/dL — ABNORMAL LOW (ref 13.0–17.0)
MCH: 30.2 pg (ref 26.0–34.0)
MCHC: 32.7 g/dL (ref 30.0–36.0)
MCV: 92.4 fL (ref 80.0–100.0)
Platelets: 183 10*3/uL (ref 150–400)
RBC: 2.78 MIL/uL — ABNORMAL LOW (ref 4.22–5.81)
RDW: 13.7 % (ref 11.5–15.5)
WBC: 7 10*3/uL (ref 4.0–10.5)
nRBC: 0 % (ref 0.0–0.2)

## 2021-05-09 LAB — MAGNESIUM: Magnesium: 2.2 mg/dL (ref 1.7–2.4)

## 2021-05-09 LAB — VITAMIN B1: Vitamin B1 (Thiamine): 104.4 nmol/L (ref 66.5–200.0)

## 2021-05-09 MED ORDER — FOLIC ACID 1 MG PO TABS: 1.0000 mg | ORAL_TABLET | Freq: Every day | ORAL | Status: DC

## 2021-05-09 MED ORDER — CYANOCOBALAMIN 500 MCG PO TABS: 500.0000 ug | ORAL_TABLET | Freq: Every day | ORAL | Status: DC

## 2021-05-09 MED ORDER — CEFDINIR 300 MG PO CAPS: 300.0000 mg | ORAL_CAPSULE | Freq: Two times a day (BID) | ORAL | 0 refills | Status: DC

## 2021-05-09 MED ORDER — CEFDINIR 300 MG PO CAPS
300.0000 mg | ORAL_CAPSULE | Freq: Two times a day (BID) | ORAL | Status: DC
Start: 2021-05-09 — End: 2021-05-09
  Administered 2021-05-09: 300 mg via ORAL
  Filled 2021-05-09: qty 1

## 2021-05-09 NOTE — Progress Notes (Signed)
Occupational Therapy Treatment ?Patient Details ?Name: Derek Foster ?MRN: [id number] ?DOB: 18-Feb-1935 ?Today's Date: 05/09/2021 ? ? ?History of present illness Derek Foster is a 86 y.o. male with medical history significant of with history of coronary artery disease, essential hypertension, hypothyroidism mixed hyperlipidemia, NSTEMI, paroxysmal atrial fibrillation, and more presents to ED with a chief complaint of weakness.  Patient reports he is so weak that he could not stand up.  Home PT came today and put a belt on him and tried to stand him up but his legs were not able to hold him up.  There is no pain in the legs.  There is no numbness or tingling.  There is no asymmetric weakness.  Patient reports that he has a decrease in appetite but he has not been feeling good.  He cannot explain what he means when he says he has not been feeling good.  He denies nausea, vomiting, stomach pain, diarrhea.  He does admit to 3 days of constipation but did have a bowel movement while in the ED.  Patient denies back pain, bladder pain.  He has an indwelling Foley.  His urine in the Foley bag is brown, he thinks that that may have started yesterday, but he is not sure.  Patient has no other complaints. ?  ?OT comments ? Pt agreeable to OT treatment. Pt assist to chair with moderate assist for bed mobility and step pivot transfer. Pt also completed on additional sit to stand from chair needing more assistance due to lower level. Bed raised during sit to stand from EOB. Pt continues do demonstrate slow labored movement during pivot to chair with RW. Once sitting pt completed B UE strengthening well and was provided HEP. Nursing notified pt was in chair. Pt will benefit from continued OT in the hospital and recommended venue below to increase strength, balance, and endurance for safe ADL's.  ? ?  ? ?Recommendations for follow up therapy are one component of a multi-disciplinary discharge planning process, led by the attending  physician.  Recommendations may be updated based on patient status, additional functional criteria and insurance authorization. ?   ?Follow Up Recommendations ? Skilled nursing-short term rehab (<3 hours/day)  ?  ?Assistance Recommended at Discharge Intermittent Supervision/Assistance  ?Patient can return home with the following ? A lot of help with walking and/or transfers;A lot of help with bathing/dressing/bathroom;Assistance with cooking/housework;Assist for transportation;Help with stairs or ramp for entrance ?  ?Equipment Recommendations ? None recommended by OT  ?  ?Recommendations for Other Services   ? ?  ?Precautions / Restrictions Precautions ?Precautions: Fall ?Restrictions ?Weight Bearing Restrictions: No  ? ? ?  ? ?Mobility Bed Mobility ?Overal bed mobility: Needs Assistance ?Bed Mobility: Supine to Sit ?  ?  ?Supine to sit: Mod assist, HOB elevated ?  ?  ?General bed mobility comments: Slow labored movement with HOB elevated. ?  ? ?Transfers ?Overall transfer level: Needs assistance ?Equipment used: Rolling walker (2 wheels) ?Transfers: Sit to/from Stand, Bed to chair/wheelchair/BSC ?Sit to Stand: Mod assist ?  ?  ?Step pivot transfers: Mod assist ?  ?  ?General transfer comment: Slow labored movement with use of gait belt and RW. Cuing for safety to wait till pt could feel the chair on his legs to sit. ?  ?  ?Balance Overall balance assessment: Needs assistance ?Sitting-balance support: Feet supported, No upper extremity supported ?Sitting balance-Leahy Scale: Fair ?Sitting balance - Comments: fair/good seated at EOB ?  ?Standing balance support: [place]  on assistive device for balance, During functional activity, Bilateral upper extremity supported ?Standing balance-Leahy Scale: Poor ?Standing balance comment: using RW ?  ?  ?  ?  ?  ?  ?  ?  ?  ?  ?  ?   ? ?ADL either performed or assessed with clinical judgement  ? ?ADL Overall ADL's : Needs assistance/impaired ?  ?  ?  ?  ?  ?  ?  ?  ?  ?  ?  ?   ?  ?  ?  ?  ?  ?  ?Functional mobility during ADLs: Moderate assistance;Maximal assistance;Rolling walker (2 wheels) ?General ADL Comments: Pt able to complate transfer to chair with slow albored movement. ?  ? ? ? ?Cognition Arousal/Alertness: Awake/alert ?Behavior During Therapy: [facility] for tasks assessed/performed ?Overall Cognitive Status: Within Functional Limits for tasks assessed ?  ?  ?  ?  ?  ?  ?  ?  ?  ?  ?  ?  ?  ?  ?  ?  ?  ?  ?  ?   ?Exercises Exercises: General Upper Extremity ?General Exercises - Upper Extremity ?Shoulder Flexion: AROM, Both, 10 reps, Seated (x10 protraction, external rotation, and internal rotation as well for same reps and position.) ?Shoulder ABduction: AROM, Both, 10 reps, Seated ?Shoulder Horizontal ABduction: AROM, Both, 10 reps, Seated ? ?  ?   ? ? ?  ?    ? ? ?Pertinent Vitals/ Pain       Pain Assessment ?Pain Assessment: No/denies pain ? ?   ?  ?  ?  ?  ?  ?  ?  ?  ?  ?  ?  ?  ?  ?  ?  ?  ?  ?  ? ?  ?    ?  ?  ?  ?   ? ?Frequency ? Min 2X/week  ? ? ? ? ?  ?Progress Toward Goals ? ?OT Goals(current goals can now be found in the care plan section) ? Progress towards OT goals: Progressing toward goals ? ?Acute Rehab OT Goals ?Patient Stated Goal: return home ?OT Goal Formulation: With patient ?Time For Goal Achievement: 05/21/21 ?Potential to Achieve Goals: Fair ?ADL Goals ?Pt Will Perform Grooming: standing;with min assist;with adaptive equipment ?Pt Will Perform Lower Body Bathing: with min assist;sitting/lateral leans;with adaptive equipment ?Pt Will Perform Lower Body Dressing: with min assist;sitting/lateral leans;with adaptive equipment ?Pt Will Transfer to Toilet: with min guard assist;stand pivot transfer;bedside commode ?Pt/caregiver will Perform Home Exercise Program: Increased strength;Both right and left upper extremity;Independently  ?Plan Discharge plan remains appropriate   ? ?   ?  ?  ?  ?  ? ?  ?   ?  ?  ?  ?  ?  ?  ? ?  ?End of Session Equipment Utilized During  Treatment: Rolling walker (2 wheels);Gait belt ? ?OT Visit Diagnosis: Unsteadiness on feet (R26.81);Other abnormalities of gait and mobility (R26.89);Repeated falls (R29.6);History of falling (Z91.81);Muscle weakness (generalized) (M62.81) ?  ?Activity Tolerance Patient tolerated treatment well ?  ?Patient Left in chair;with call bell/phone within reach;with nursing/sitter in room ?  ?Nurse Communication Other (comment) (Notified pt was in chair.) ?  ? ?   ? ?Time: [id number] ?OT Time Calculation (min): 16 min ? ?Charges: OT General Charges ?$OT Visit: 1 Visit ?OT Treatments ?$Therapeutic Exercise: 8-22 mins ? ?Larey Seat OT, MOT ? ?Larey Seat ?05/09/2021, 9:51 AM ?

## 2021-05-09 NOTE — Patient Instructions (Signed)

## 2021-05-09 NOTE — Progress Notes (Signed)
Report called to [place], LPN at [facility]. NT removed IV"s and assisted patient to room 160 ?

## 2021-05-09 NOTE — Assessment & Plan Note (Signed)
BMI 42.72 ?Lifestyle modification ?

## 2021-05-09 NOTE — Discharge Summary (Addendum)
?Physician Discharge Summary ?  ?Patient: Derek Foster MRN: [id number] DOB: 1935/07/12  ?Admit date:     05/06/2021  ?Discharge date: 05/09/21  ?Discharge Physician: Shanon Brow Sheniah Supak  ? ?PCP: Celene Squibb, MD  ? ?Recommendations at discharge:  ? Please follow up with primary care provider within 1-2 weeks ? Please repeat BMP and CBC in one week ? Please follow up on/with [facility] urology.  Referral was sent on day of d/c, please confirm date ? ? ? ?Hospital Course: ? ? ? 86 y.o. male with medical history significant of with history of coronary artery disease, essential hypertension,HLD,  hypothyroidism mixed hyperlipidemia, NSTEMI, paroxysmal atrial fibrillation, and chronic foley presents to ED with a chief complaint of weakness.  Patient reports he is so weak that he could not stand up.  This has been progressive over 6 weeks. Home PT came on day of admssion and put a belt on him and tried to stand him up but his legs were not able to hold him up.  There is no pain in the legs.  There is no numbness or tingling.  There is no asymmetric weakness.  Patient reports that he has a decrease in appetite but he has not been feeling good.  He cannot explain what he means when he says he has not been feeling good.  He denies nausea, vomiting, stomach pain, diarrhea.  He does admit to 3 days of constipation but did have a bowel movement while in the ED.  Patient denies back pain, bladder pain.  He has an indwelling Foley.  His urine in the Foley bag is brown, he thinks that that may have started yesterday, but he is not sure.  Patient has no other complaints.  Foley was changed on 04/06/21 prior to admission. ?  ?Patient does not smoke, does not drink alcohol, does not use illicit drugs.  He is vaccinated for COVID.  Patient is DNR. ? ?Assessment and Plan: ?* Generalized weakness ?- Patient is so weak he cannot stand up ?-Home PT was not able to stand him up ?-supplement B12--lower limit of normal ?-supplement folate ?-Consult  TOC ?-Consult PT>>SNF ?-Continue to monitor ? ?Obesity, Class III, BMI 40-49.9 (morbid obesity) ([place]) ?BMI 42.72 ?Lifestyle modification ? ?Hematuria ?Suprapubic pain with new hematuria ?-Indwelling Foley changed 4/18 ?-4/19 CT abd--Foley catheter balloon is inflated within the prostate;  Focal soft tissue of the left lateral bladder wall ?-4/17 Urine culture = multiple organisms ?-continued Rocephin ?-05/08/21--coude catheter 18 Fr inserted ?-d/c with 4 more days cefdinir ?--patient made aware of bladder wall abnormality>>sent referral to for [facility] urology for outpt follow up ?--Hgb stable, hematuria improved ? ?PAF (paroxysmal atrial fibrillation) ([place]) ?- Continue aspirin and Coreg ?-rate controlled ? ?UTI (urinary tract infection) due to urinary indwelling catheter ([facility]) ?- UA indicative of UTI ?-Indwelling Foley changed 4/18 ?-4/19 CT abd--Foley catheter balloon is inflated within the prostate;  Focal soft tissue of the left lateral bladder wall ?-4/17 Urine culture = multiple organisms ?-continued Rocephin ?-05/08/21--coude catheter 18 Fr inserted ?-d/c with 4 more days cefdinir ?--patient made aware of bladder wall abnormality>>sent referral to for [facility] urology for outpt follow up ? ?Acute renal failure superimposed on stage 3b chronic kidney disease ([place]) ?Baseline creatinine 1.5-1.8 ?Serum creatinine 1.39 on day of d/c ? ?Essential hypertension ?- Continue Coreg ?-hold hydralazine ?Continue to monitor ? ?Mixed hyperlipidemia ?Continue statin ? ?Hyperthyroidism ?- Continue methimazole ?-Free T4 = 1.24  ?-Continue to monitor ? ? ? ? ?  ?Consultants:  none ?Procedures performed: none  ?Disposition: SNF ?Diet recommendation: cardiac ?Cardiac diet ?DISCHARGE MEDICATION: ?Allergies as of 05/09/2021   ? ?   Reactions  ? Indomethacin Anaphylaxis  ? But tolerates ibuprofen, aleve  ? Iodine-131 Anaphylaxis  ? Lidocaine-menthol   ? Other reaction(s): Bradycardia  ? ?  ? ?  ?Medication List  ?  ? ?STOP taking  these medications   ? ?nitroGLYCERIN 0.4 MG SL tablet ?Commonly known as: NITROSTAT ?  ? ?  ? ?TAKE these medications   ? ?acetaminophen 325 MG tablet ?Commonly known as: TYLENOL ?Take 325 mg by mouth every 6 (six) hours as needed. ?  ?aspirin EC 81 MG tablet ?Take 81 mg by mouth daily. Swallow whole. ?  ?carvedilol 6.25 MG tablet ?Commonly known as: COREG ?Take 1.5 tablets (9.375 mg total) by mouth 2 (two) times daily. ?  ?cefdinir 300 MG capsule ?Commonly known as: OMNICEF ?Take 1 capsule (300 mg total) by mouth every 12 (twelve) hours. X 4 days ?  ?Coenzyme Q10 200 MG capsule ?Take 200 mg by mouth daily. ?  ?diclofenac Sodium 1 % Gel ?Commonly known as: VOLTAREN ?as needed. ?  ?finasteride 5 MG tablet ?Commonly known as: PROSCAR ?Take 1 tablet (5 mg total) by mouth daily. ?  ?folic acid 1 MG tablet ?Commonly known as: FOLVITE ?Take 1 tablet (1 mg total) by mouth daily. ?Start taking on: May 10, 2021 ?  ?furosemide 20 MG tablet ?Commonly known as: LASIX ?Take 2 tablets (40 mg total) by mouth daily. ?  ?gabapentin 100 MG capsule ?Commonly known as: NEURONTIN ?Take 100 mg by mouth daily. ?  ?hydrALAZINE 50 MG tablet ?Commonly known as: APRESOLINE ?Take 1 tablet (50 mg total) by mouth 3 (three) times daily. ?  ?methimazole 10 MG tablet ?Commonly known as: TAPAZOLE ?Take 2 tablets (20 mg total) by mouth 2 (two) times daily. ?  ?multivitamin tablet ?Take 1 tablet by mouth daily. ?  ?potassium chloride 10 MEQ tablet ?Commonly known as: KLOR-CON M ?Take 1 tablet (10 mEq total) by mouth every evening. ?What changed: when to take this ?  ?pravastatin 20 MG tablet ?Commonly known as: PRAVACHOL ?Take 1 tablet (20 mg total) by mouth every evening. ?  ?tamsulosin 0.4 MG Caps capsule ?Commonly known as: FLOMAX ?Take 0.4 mg by mouth every morning. ?  ?vitamin B-12 500 MCG tablet ?Commonly known as: CYANOCOBALAMIN ?Take 1 tablet (500 mcg total) by mouth daily. ?  ? ?  ? ? Contact information for after-discharge care   ? ?  Destination   ? ? [facility] .   ?Service: Skilled Nursing ?Contact information: ?[address] ?[place] ?[phone] ? ?  ?  ? ?  ?  ? ?  ?  ? ?  ? ?Discharge Exam: ?Filed Weights  ? 05/06/21 1400  ?Weight: (!) 142.9 kg  ? ?HEENT:  [place]/AT, No thrush, no icterus ?CV:  RRR, no rub, no S3, no S4 ?Lung:  CTA, no wheeze, no rhonchi ?Abd:  soft/+BS, NT ?Ext:  1 + LE edema, no lymphangitis, no synovitis, no rash ? ? ?Condition at discharge: stable ? ?The results of significant diagnostics from this hospitalization (including imaging, microbiology, ancillary and laboratory) are listed below for reference.  ? ?Imaging Studies: ?CT ABDOMEN PELVIS WO CONTRAST ? ?Result Date: 05/08/2021 ?CLINICAL DATA:  Abdominal pain EXAM: CT ABDOMEN AND PELVIS WITHOUT CONTRAST TECHNIQUE: Multidetector CT imaging of the abdomen and pelvis was performed following the standard protocol  without IV contrast. RADIATION DOSE REDUCTION: This exam was performed according to the departmental dose-optimization program which includes automated exposure control, adjustment of the mA and/or kV according to patient size and/or use of iterative reconstruction technique. COMPARISON:  CT abdomen and pelvis dated July 25, 2019 FINDINGS: Lower chest: Centrilobular emphysema. Small bilateral pleural effusions. Coronary artery calcifications. Hepatobiliary: No focal liver abnormality is seen. Status post cholecystectomy. No biliary dilatation. Pancreas: Unremarkable. No pancreatic ductal dilatation or surrounding inflammatory changes. Spleen: Normal in size without focal abnormality. Adrenals/Urinary Tract: New indeterminate right adrenal gland nodule measuring 1 5 cm on series 2, image 29. No hydronephrosis. Soft tissue mass of the left lateral bladder wall measuring approximately 3.9 x 2.1 cm. Stomach/Bowel: Stomach is within normal limits. Appendix is not visualized, although there are no secondary  findings of acute appendicitis. No evidence of bowel wall thickening, distention, or inflammatory changes. Vascular/Lymphatic: Aortic atherosclerosis. Mildly enlarged left pelvic lymph nodes. Reference left pelvic side

## 2021-05-09 NOTE — TOC Transition Note (Signed)
Transition of Care (TOC) - CM/SW Discharge Note ? ? ?Patient Details  ?Name: Derek Foster ?MRN: [id number] ?Date of Birth: July 07, 1935 ? ?Transition of Care (TOC) CM/SW Contact:  ?Boneta Lucks, RN ?Phone Number: ?05/09/2021, 10:39 AM ? ? ?Insurance auth received, Patient can discharge to [facility] TOC left his daughter a message. RN will call report. DC summary sent in the hub.  ? ?Final next level of care: [place] ?Barriers to Discharge: Barriers Resolved ? ?Patient Goals and CMS Choice ?Patient states their goals for this hospitalization and ongoing recovery are:: to go to SNF ?CMS Medicare.gov Compare Post Acute Care list provided to:: Patient ?Choice offered to / list presented to : Patient ? ?Discharge Placement ?  ?          ?Patient chooses bed at:  [facility]) ?Patient to be transferred to facility by: [facility] staff ?  ?Patient and family notified of of transfer: 05/09/21 ? ?Discharge Plan and Services ?   ?Post Acute Care Choice: [place]          ?  ? Readmission Risk Interventions ? ?  05/09/2021  ? 10:38 AM 06/30/2019  ?  3:30 PM 03/11/2019  ?  1:35 PM  ?Readmission Risk Prevention Plan  ?Transportation Screening Complete Complete Complete  ?PCP or Specialist Appt within 3-5 Days  Not Complete Not Complete  ?Not Complete comments  SNF is expected discharge plan plan for SNF  ?Home Care Screening Complete    ?Medication Review (RN CM) Complete    ?[place] or Home Care Consult  Complete Not Complete  ?[place] or Home Care Consult comments   plan for SNF  ?Social Work Consult for [place] Planning/Counseling  Complete Complete  ?Palliative Care Screening  Not Applicable Complete  ?Medication Review Press photographer)  Complete Referral to Pharmacy  ? ? ? ? ? ?

## 2021-05-09 NOTE — Assessment & Plan Note (Signed)
Baseline creatinine 1.5-1.8 ?Serum creatinine 1.39 on day of d/c ?

## 2021-05-09 NOTE — Care Management Important Message (Signed)
Important Message ? ?Patient Details  ?Name: Derek Foster ?MRN: [id number] ?Date of Birth: [birth year] ? ? ?Medicare Important Message Given:  Yes ? ? ? ? ?Tommy Medal ?05/09/2021, 11:49 AM ?

## 2021-05-10 ENCOUNTER — Non-Acute Institutional Stay (SKILLED_NURSING_FACILITY): Payer: PPO | Admitting: Adult Health

## 2021-05-10 ENCOUNTER — Encounter: Payer: Self-pay | Admitting: Adult Health

## 2021-05-10 DIAGNOSIS — N1832 Chronic kidney disease, stage 3b: Secondary | ICD-10-CM

## 2021-05-10 DIAGNOSIS — N138 Other obstructive and reflux uropathy: Secondary | ICD-10-CM

## 2021-05-10 DIAGNOSIS — I480 Paroxysmal atrial fibrillation: Secondary | ICD-10-CM

## 2021-05-10 DIAGNOSIS — I700 Atherosclerosis of aorta: Secondary | ICD-10-CM

## 2021-05-10 DIAGNOSIS — I10 Essential (primary) hypertension: Secondary | ICD-10-CM

## 2021-05-10 DIAGNOSIS — N401 Enlarged prostate with lower urinary tract symptoms: Secondary | ICD-10-CM

## 2021-05-10 DIAGNOSIS — E782 Mixed hyperlipidemia: Secondary | ICD-10-CM

## 2021-05-10 DIAGNOSIS — R600 Localized edema: Secondary | ICD-10-CM

## 2021-05-10 DIAGNOSIS — D649 Anemia, unspecified: Secondary | ICD-10-CM | POA: Insufficient documentation

## 2021-05-10 DIAGNOSIS — N390 Urinary tract infection, site not specified: Secondary | ICD-10-CM

## 2021-05-10 DIAGNOSIS — T83511D Infection and inflammatory reaction due to indwelling urethral catheter, subsequent encounter: Secondary | ICD-10-CM | POA: Diagnosis not present

## 2021-05-10 DIAGNOSIS — R531 Weakness: Secondary | ICD-10-CM | POA: Diagnosis not present

## 2021-05-10 DIAGNOSIS — E0590 Thyrotoxicosis, unspecified without thyrotoxic crisis or storm: Secondary | ICD-10-CM

## 2021-05-10 DIAGNOSIS — M5416 Radiculopathy, lumbar region: Secondary | ICD-10-CM

## 2021-05-10 DIAGNOSIS — E538 Deficiency of other specified B group vitamins: Secondary | ICD-10-CM | POA: Insufficient documentation

## 2021-05-10 DIAGNOSIS — E43 Unspecified severe protein-calorie malnutrition: Secondary | ICD-10-CM

## 2021-05-10 LAB — URINE CULTURE: Culture: 50000 — AB

## 2021-05-10 NOTE — Progress Notes (Signed)
?Location:  [place] ?Nursing Home Room Number: 160-P ?Place of Service:  SNF (31) ? ? ?CODE STATUS: DNR ? ?Allergies  ?Allergen Reactions  ? Indomethacin Anaphylaxis  ?  But tolerates ibuprofen, aleve  ? Iodine-131 Anaphylaxis  ? Lidocaine-Menthol   ?  Other reaction(s): Bradycardia  ? ? ?Chief Complaint  ?Patient presents with  ? Hospitalization Follow-up  ? ? ?HPI: ? ?He is a 86 year old man who has been hospitalized 05-06-21 through 05-09-21. His medical history includes: afib; hypertension; HLD. He presented to the ED with increased weakness. He was so weak that he could not stand up. This has been progressive for the past 6 weeks prior to admission. The weakness is asymmetric.he has blood in his urine; his foley had been inflated in his prostate.  He was treated for uti which grew out mixed bacteria. He is to complete omnicef. He is here for short term rehab with his goal to return back home. He tells me that he is feeling a little better. There are no reports of pain present. He will continue to be followed for his chronic illnesses including: (PAF) paroxysmal atrial fibrillation:  Essential hypertension: Hyperthyroidism: Lumbar radiculopathy: ? ? ?Past Medical History:  ?Diagnosis Date  ? AKI (acute kidney injury) ([place]) 02/12/2019  ? CAD (coronary artery disease)   ? a. cath 01/27/2009 : s/p promus DES to LAD and medical managment of 70-80% mRCA  ? Chronic kidney disease, stage 3b ([place])   ? COVID-19   ? Essential hypertension   ? GI bleeding   ? Hematuria   ? Hyperthyroidism   ? Hypothyroidism   ? Mild atherosclerosis of carotid artery   ? Mild pulmonary hypertension ([place])   ? Mixed hyperlipidemia   ? NSTEMI (non-ST elevated myocardial infarction) [facility]) 2011  ? PAF (paroxysmal atrial fibrillation) ([place])   ? ? ?Past Surgical History:  ?Procedure Laterality Date  ? BOWEL RESECTION N/A 06/28/2019  ? Procedure: SMALL BOWEL RESECTION;  Surgeon: Virl Cagey, MD;  Location: [facility];  Service: General;   Laterality: N/A;  ? CHOLECYSTECTOMY    ? CORONARY STENT PLACEMENT    ? CYSTOSCOPY/RETROGRADE/URETEROSCOPY Bilateral 03/09/2019  ? Procedure: CYSTOSCOPY, Clot Evacuation, Fulguration;  Surgeon: Festus Aloe, MD;  Location: [place];  Service: Urology;  Laterality: Bilateral;  ? HERNIA REPAIR    ? LYSIS OF ADHESION N/A 06/28/2019  ? Procedure: LYSIS OF ADHESION;  Surgeon: Virl Cagey, MD;  Location: [facility];  Service: General;  Laterality: N/A;  ? VENTRAL HERNIA REPAIR N/A 06/28/2019  ? Procedure: HERNIA REPAIR VENTRAL ADULT WITH VICRYL MESH OVERLAY;  Surgeon: Virl Cagey, MD;  Location: [facility];  Service: General;  Laterality: N/A;  ? ? ?Social History  ? ?Socioeconomic History  ? Marital status: Married  ?  Spouse name: Not on file  ? Number of children: Not on file  ? Years of education: Not on file  ? Highest education level: Not on file  ?Occupational History  ? Occupation: Charity fundraiser  ?Tobacco Use  ? Smoking status: Former  ?  Types: Cigarettes  ?  Quit date: 01/20/1957  ?  Years since quitting: 64.3  ? Smokeless tobacco: Never  ?Vaping Use  ? Vaping Use: Never used  ?Substance and Sexual Activity  ? Alcohol use: No  ? Drug use: Never  ? Sexual activity: Not on file  ?Other Topics Concern  ? Not on file  ?Social History Narrative  ? Quit smoking about 62 years ago.  ? ?  Social Determinants of Health  ? ?Financial Resource Strain: Not on file  ?Food Insecurity: Not on file  ?Transportation Needs: Not on file  ?Physical Activity: Not on file  ?Stress: Not on file  ?Social Connections: Not on file  ?Intimate Partner Violence: Not on file  ? ?Family History  ?Problem Relation Age of Onset  ? Heart attack Father   ? Lung cancer Sister   ? ? ? ? ?VITAL SIGNS ?BP (!) 127/53   Pulse 60   Temp 98.4 ?F (36.9 ?C)   Resp 20   Ht 6' (1.829 m)   Wt (!) 312 lb 9.6 oz (141.8 kg)   SpO2 95%   BMI 42.40 kg/m?  ? ?Outpatient Encounter Medications as of 05/10/2021  ?Medication Sig  ? acetaminophen (TYLENOL) 325 MG tablet  Take 325 mg by mouth every 6 (six) hours as needed.  ? aspirin EC 81 MG tablet Take 81 mg by mouth daily. Swallow whole.  ? carvedilol (COREG) 6.25 MG tablet Take 1.5 tablets (9.375 mg total) by mouth 2 (two) times daily.  ? cefdinir (OMNICEF) 300 MG capsule Take 1 capsule (300 mg total) by mouth every 12 (twelve) hours. X 4 days  ? Coenzyme Q10 200 MG capsule Take 200 mg by mouth daily.  ? finasteride (PROSCAR) 5 MG tablet Take 1 tablet (5 mg total) by mouth daily.  ? folic acid (FOLVITE) 1 MG tablet Take 1 tablet (1 mg total) by mouth daily.  ? furosemide (LASIX) 20 MG tablet Take 2 tablets (40 mg total) by mouth daily.  ? gabapentin (NEURONTIN) 100 MG capsule Take 100 mg by mouth daily.  ? methimazole (TAPAZOLE) 10 MG tablet Take 2 tablets (20 mg total) by mouth 2 (two) times daily.  ? Multiple Vitamin (MULTIVITAMIN) tablet Take 1 tablet by mouth daily.  ? NON FORMULARY Diet: Regular  ? potassium chloride (KLOR-CON) 10 MEQ tablet Take 1 tablet (10 mEq total) by mouth every evening.  ? tamsulosin (FLOMAX) 0.4 MG CAPS capsule Take 0.4 mg by mouth every morning.  ? vitamin B-12 (CYANOCOBALAMIN) 500 MCG tablet Take 1 tablet (500 mcg total) by mouth daily.  ? diclofenac Sodium (VOLTAREN) 1 % GEL as needed. (Patient not taking: Reported on 05/10/2021)  ? hydrALAZINE (APRESOLINE) 50 MG tablet Take 1 tablet (50 mg total) by mouth 3 (three) times daily.  ? pravastatin (PRAVACHOL) 20 MG tablet Take 1 tablet (20 mg total) by mouth every evening.  ? ?No facility-administered encounter medications on file as of 05/10/2021.  ? ? ? ?SIGNIFICANT DIAGNOSTIC EXAMS ? ?TODAY ? ?05-06-21: chest x-ray: No active disease.  ? ?05-07-21: pelvic ultrasound:  ?Foley catheter appears to be in the central low pelvis in the region of the decompressed bladder. For definitive localization a pelvic CT scan is suggested. ? ?05-08-21: ct of abdomen and pelvis ?1. Focal soft tissue of the left lateral bladder wall, concerning for bladder neoplasm.  Recommend cystoscopy for further evaluation. ?2. Mildly enlarged left pelvic lymph nodes, new when compared with prior exam. ?3. New indeterminate right adrenal gland nodule. Recommend adrenal protocol CT or MRI for further evaluation. ?4. Foley catheter balloon is inflated within the prostate. Recommend repositioning. ?5. Aortic Atherosclerosis  and Emphysema . ? ?LABS REVIEWED;  ? ?05-06-21: wbc 8.1; hgb 9.4; hct 29.2 mcv 92.1 plt 215; glucose 100 bun 28; creat 1.69; k+ 4.3; na++ 138; ca 8.3; GFR 40; alk phos 198; protein 6.7 albumin 2.9 urine culture: multiple species ?05-07-21: free t3: 1.8; free t4:  1.24; vitamin B 12: 227; B 1: 104.4 ?05-08-21: urine culture: 10,000 enterococcus faecalis ?05-09-21: wbc 7.0; hgb 8.4; hct 25.7; mcv 92.4 plt 183; glucose 117; bun 20; creat 1.39; k+ 3.4; na++ 137; ca 7.7; gfr 50; mag 2.2 ? ?Review of Systems  ?Constitutional:  Negative for malaise/fatigue.  ?Respiratory:  Negative for cough and shortness of breath.   ?Cardiovascular:  Negative for chest pain, palpitations and leg swelling.  ?Gastrointestinal:  Negative for abdominal pain, constipation and heartburn.  ?Musculoskeletal:  Negative for back pain, joint pain and myalgias.  ?Skin: Negative.   ?Neurological:  Positive for weakness. Negative for dizziness.  ?Psychiatric/Behavioral:  The patient is not nervous/anxious.   ? ? ?Physical Exam ?Constitutional:   ?   General: He is not in acute distress. ?   Appearance: He is well-developed. He is obese. He is not diaphoretic.  ?Neck:  ?   Thyroid: Thyromegaly present.  ?Cardiovascular:  ?   Rate and Rhythm: Normal rate and regular rhythm.  ?   Pulses: Normal pulses.  ?   Heart sounds: Normal heart sounds.  ?Pulmonary:  ?   Effort: Pulmonary effort is normal. No respiratory distress.  ?   Breath sounds: Normal breath sounds.  ?Abdominal:  ?   General: Bowel sounds are normal. There is no distension.  ?   Palpations: Abdomen is soft.  ?   Tenderness: There is no abdominal  tenderness.  ?Genitourinary: ?   Comments: foley ?Musculoskeletal:     ?   General: Normal range of motion.  ?   Cervical back: Neck supple.  ?Lymphadenopathy:  ?   Cervical: No cervical adenopathy.  ?Skin: ?   General: Sk

## 2021-05-13 ENCOUNTER — Non-Acute Institutional Stay (SKILLED_NURSING_FACILITY): Payer: PPO | Admitting: Internal Medicine

## 2021-05-13 ENCOUNTER — Encounter: Payer: Self-pay | Admitting: Internal Medicine

## 2021-05-13 DIAGNOSIS — D649 Anemia, unspecified: Secondary | ICD-10-CM

## 2021-05-13 DIAGNOSIS — E43 Unspecified severe protein-calorie malnutrition: Secondary | ICD-10-CM | POA: Diagnosis not present

## 2021-05-13 DIAGNOSIS — R531 Weakness: Secondary | ICD-10-CM

## 2021-05-13 DIAGNOSIS — R9341 Abnormal radiologic findings on diagnostic imaging of renal pelvis, ureter, or bladder: Secondary | ICD-10-CM | POA: Diagnosis not present

## 2021-05-13 DIAGNOSIS — I480 Paroxysmal atrial fibrillation: Secondary | ICD-10-CM

## 2021-05-13 NOTE — Assessment & Plan Note (Addendum)
Current rhythm is irregular; heart sounds are markedly distant making exact rhythm discernible.  05/06/2021 EKG did show PACs as well as possible PNCs.  The rhythm was not delineated in the report.  A-fib was not definitely identified. ?

## 2021-05-13 NOTE — Assessment & Plan Note (Addendum)
PT/OT at SNF as tolerated. ?Med list reviewed; on low dose gabapentin w/o nephrotoxic risk. ?

## 2021-05-13 NOTE — Assessment & Plan Note (Addendum)
Urology follow-up with Dr Irine Seal  for cystoscopy post discharge from the SNF. ?

## 2021-05-13 NOTE — Patient Instructions (Signed)
See assessment and plan under each diagnosis in the problem list and acutely for this visit 

## 2021-05-13 NOTE — Progress Notes (Signed)
? ?NURSING HOME LOCATION:  [facility] ?ROOM NUMBER:  160 P ? ?CODE STATUS: DNR ? ?PCP: [name]. Nevada Crane, MD ? ?This is a comprehensive admission note to this SNFperformed on this date less than 30 days from date of admission. ?Included are preadmission medical/surgical history; reconciled medication list; family history; social history and comprehensive review of systems.  ?Corrections and additions to the records were documented. Comprehensive physical exam was also performed. Additionally a clinical summary was entered for each active diagnosis pertinent to this admission in the Problem List to enhance continuity of care. ? ?HPI: Patient was hospitalized 4/17 - 05/09/2021, presenting with weakness to the point that he could not stand.  This had been progressive over the 6 weeks PTA.  He described anorexia without nausea, vomiting, abdominal pain or change in stool other than lack of bowel movement for 3 days PTA.  Patient has a chronic indwelling Foley.  He stated that urine had been brown for approximately 24 hours PTA.  New onset hematuria was associated with some suprapubic pain.  Foley catheter was changed 4/18.  CT of the abdomen 4/19 revealed that the catheter balloon was inflated within the prostate.  Focal soft tissue changes were noted in the left lateral bladder wall. Coude' catheter 26 French was inserted 4/19. ?Rocephin had been initiated empirically; urine culture revealed 100,000 colonies of multiple organisms.  Follow-up culture revealed 50,000 colonies of Enterococcus faecalis.Rocephin was transitioned to cefdinir for an additional 4 days. ?Admission H/H was 9.4/29.2 but this dropped to 8.4/28.7 with normochromic, normocytic indices.  H/H had been 12/37.6 in September 2022.  B12 level was low normal at 227.   ?AKI superimposed on CKD peaked with a creatinine of 1.69 and nadir GFR 40 indicating stage IIIb.  At discharge creatinine was 1.39 and GFR 50 indicating stage IIIa CKD.   ?Total protein was 5.6 and albumin 2.4 indicating protein/caloric malnutrition. ? He was discharged to the SNF for rehab.Urology outpatient follow-up for the bladder wall abnormalities was to be scheduled post SNF.  ? ?Past medical and surgical history: Includes CAD with PMH of non-STEMI, essential hypertension, dyslipidemia, hypothyroidism, PAF, prior history of AKI, history of GI bleeding, and history of mild pulmonary hypertension. ?Surgeries and procedure include history of bowel resection, cholecystectomy, coronary artery stenting, and ventral hernia repair/lysis of adhesions. ? ?Social history: Nondrinker; former smoker. ? ?Family history: Noncontributory due to advanced age. ?  ?Review of systems: He could not give me the day of the month but knew the month and the year.  He also identified the POTUS.  When asked why he had been in the hospital his response was "I had a urinary tract infection".  He states he had no knowledge of the bladder wall thickening for which urology outpatient follow-up was recommended.He describes his bladder as "weak and won't empty".  He could not remember the name of his Urologist.  He did have groin pain earlier today but this resolved.  He also describes back pain. ?He states he had been weak 3-4 weeks PTA.   ?He states he has difficulty sleeping.  He has numbness in his hands.  He states his appetite is poor.  He did have nausea and vomiting yesterday. ? ?Constitutional: No fever, significant weight change  ?Eyes: No redness, discharge, pain, vision change ?ENT/mouth: No nasal congestion, purulent discharge, earache, change in hearing, sore throat  ?Cardiovascular: No chest pain, palpitations, paroxysmal nocturnal dyspnea, claudication, edema  ?Respiratory: No cough, sputum production, hemoptysis,  DOE, significant snoring, apnea  ?Gastrointestinal: No heartburn, dysphagia, abdominal pain, rectal bleeding, melena, change in bowels ?Musculoskeletal: No joint stiffness, joint  swelling ?Dermatologic: No rash, pruritus, change in appearance of skin ?Neurologic: No dizziness, headache, syncope, seizures, numbness, tingling ?Psychiatric: No significant anxiety, depression ?Endocrine: No change in hair/skin/nails, excessive thirst, excessive hunger, excessive urination  ?Hematologic/lymphatic: No significant bruising, lymphadenopathy, abnormal bleeding ?Allergy/immunology: No itchy/watery eyes, significant sneezing, urticaria, angioedema ? ?Physical exam:  ?Pertinent or positive findings:He is morbidly obese.  He was eating lunch but only the pudding dessert, not the main course, stating that he was not hungry.  He is somewhat hard of hearing.  Pattern alopecia is present.  He is Geneticist, molecular.  There is slight exotropia of the right eye.  Ptosis is greater on the right than the left.  Eyebrows are decreased laterally.  The left lobe of the thyroid is clinically larger than the right.  No nodules are palpable.  Heart sounds are distant and slightly irregular.  Breath sounds are decreased.  Midline abdominal op scar well-healed.  Abdomen is massive.  Foley catheter in place.  He has 1+ edema at the sock line.  Dorsalis pedis pulses are stronger than posterior tibial pulses.  There is wasting between the thumb and the index finger bilaterally.  The forearms revealed bruising as well as keratotic lesions.  There is a malar rash on the left suggesting rosacea.  He has tiny eschar lesions over the crown.  Stasis dermatitis present at the shins. ? ?General appearance: no acute distress, increased work of breathing is present.   ?Lymphatic: No lymphadenopathy about the head, neck, axilla. ?Eyes: No conjunctival inflammation or lid edema is present. There is no scleral icterus. ?Ears:  External ear exam shows no significant lesions or deformities.   ?Nose:  External nasal examination shows no deformity or inflammation. Nasal mucosa are pink and moist without lesions, exudates ?Oral exam: Lips and gums are  healthy appearing.There is no oropharyngeal erythema or exudate. ?Neck:  No masses, tenderness noted.    ?Heart:  No gallop, murmur, click, rub.  ?Lungs:  without wheezes, rhonchi, rales, rubs. ?Abdomen: Bowel sounds are normal.  Abdomen is soft and nontender with no organomegaly, hernias, masses. ?GU: Deferred  ?Extremities:  No cyanosis, clubbing. ?Neurologic exam: Balance, Rhomberg, finger to nose testing could not be completed due to clinical state ?Skin: Warm & dry w/o tenting. ? ?See clinical summary under each active problem in the Problem List with associated updated therapeutic plan ? ?

## 2021-05-13 NOTE — Assessment & Plan Note (Signed)
Current total protein 5.6 and albumin 2.4.  Nutrition consult at SNF. ?

## 2021-05-13 NOTE — Assessment & Plan Note (Signed)
H/H 12/37.6 in September 2022.  During 4/17 - 05/09/2021 admission for weakness H/H dropped from 9.4/29.2 down to 8.4/28.7 at discharge.  Indices normochromic normocytic.  B12 low normal at 227.  Monitor for bleeding dyscrasias. ?

## 2021-05-14 ENCOUNTER — Encounter: Payer: Self-pay | Admitting: Adult Health

## 2021-05-14 ENCOUNTER — Non-Acute Institutional Stay (SKILLED_NURSING_FACILITY): Payer: PPO | Admitting: Adult Health

## 2021-05-14 DIAGNOSIS — M545 Low back pain, unspecified: Secondary | ICD-10-CM

## 2021-05-14 NOTE — Progress Notes (Signed)
?Location:  [facility] ?Nursing Home Room Number: 160-P ?Place of Service:  SNF (31) ? ? ?CODE STATUS: DNR ? ?Allergies  ?Allergen Reactions  ? Indomethacin Anaphylaxis  ?  But tolerates ibuprofen, aleve  ? Iodine-131 Anaphylaxis  ? Lidocaine-Menthol   ?  Other reaction(s): Bradycardia  ? ? ?Chief Complaint  ?Patient presents with  ? Acute Visit  ?  Pain management   ? ? ?HPI: ? ?He is having bilateral lower back pain. He tells me that his pain started after sleeping in the bed at this facility. He does have some stiffness present. The pain is constant and achy in nature. The pain does not radiate. He does not have any thing ordered for his pain.  ? ?Past Medical History:  ?Diagnosis Date  ? AKI (acute kidney injury) ([place]) 02/12/2019  ? CAD (coronary artery disease)   ? a. cath 01/27/2009 : s/p promus DES to LAD and medical managment of 70-80% mRCA  ? Chronic kidney disease, stage 3b ([place])   ? COVID-19   ? Essential hypertension   ? GI bleeding   ? Hematuria   ? Hyperthyroidism   ? Hypothyroidism   ? Mild atherosclerosis of carotid artery   ? Mild pulmonary hypertension ([place])   ? Mixed hyperlipidemia   ? NSTEMI (non-ST elevated myocardial infarction) [facility]) 2011  ? PAF (paroxysmal atrial fibrillation) ([place])   ? ? ?Past Surgical History:  ?Procedure Laterality Date  ? BOWEL RESECTION N/A 06/28/2019  ? Procedure: SMALL BOWEL RESECTION;  Surgeon: Virl Cagey, MD;  Location: [facility];  Service: General;  Laterality: N/A;  ? CHOLECYSTECTOMY    ? CORONARY STENT PLACEMENT    ? CYSTOSCOPY/RETROGRADE/URETEROSCOPY Bilateral 03/09/2019  ? Procedure: CYSTOSCOPY, Clot Evacuation, Fulguration;  Surgeon: Festus Aloe, MD;  Location: [facility];  Service: Urology;  Laterality: Bilateral;  ? HERNIA REPAIR    ? LYSIS OF ADHESION N/A 06/28/2019  ? Procedure: LYSIS OF ADHESION;  Surgeon: Virl Cagey, MD;  Location: [facility];  Service: General;  Laterality: N/A;  ? VENTRAL HERNIA REPAIR N/A 06/28/2019  ? Procedure: HERNIA  REPAIR VENTRAL ADULT WITH VICRYL MESH OVERLAY;  Surgeon: Virl Cagey, MD;  Location: [facility];  Service: General;  Laterality: N/A;  ? ? ?Social History  ? ?Socioeconomic History  ? Marital status: Married  ?  Spouse name: Not on file  ? Number of children: Not on file  ? Years of education: Not on file  ? Highest education level: Not on file  ?Occupational History  ? Occupation: Charity fundraiser  ?Tobacco Use  ? Smoking status: Former  ?  Types: Cigarettes  ?  Quit date: 01/20/1957  ?  Years since quitting: 64.3  ? Smokeless tobacco: Never  ?Vaping Use  ? Vaping Use: Never used  ?Substance and Sexual Activity  ? Alcohol use: No  ? Drug use: Never  ? Sexual activity: Not on file  ?Other Topics Concern  ? Not on file  ?Social History Narrative  ? Quit smoking about 62 years ago.  ? ?Social Determinants of Health  ? ?Financial Resource Strain: Not on file  ?Food Insecurity: Not on file  ?Transportation Needs: Not on file  ?Physical Activity: Not on file  ?Stress: Not on file  ?Social Connections: Not on file  ?Intimate Partner Violence: Not on file  ? ?Family History  ?Problem Relation Age of Onset  ? Heart attack Father   ? Lung cancer Sister   ? ? ? ? ?VITAL SIGNS ?  BP 131/61   Pulse 67   Temp 98.2 ?F (36.8 ?C)   Resp 20   Ht 6' (1.829 m)   Wt (!) 311 lb 1.6 oz (141.1 kg)   SpO2 97%   BMI 42.19 kg/m?  ? ?Outpatient Encounter Medications as of 05/14/2021  ?Medication Sig  ? acetaminophen (TYLENOL) 325 MG tablet Take 325 mg by mouth every 6 (six) hours as needed.  ? Amino Acids-Protein Hydrolys (FEEDING SUPPLEMENT, PRO-STAT SUGAR FREE 64,) LIQD Take 30 mLs by mouth 3 (three) times daily with meals.  ? aspirin EC 81 MG tablet Take 81 mg by mouth daily. Swallow whole.  ? carvedilol (COREG) 6.25 MG tablet Take 1.5 tablets (9.375 mg total) by mouth 2 (two) times daily.  ? Coenzyme Q10 200 MG capsule Take 200 mg by mouth daily.  ? finasteride (PROSCAR) 5 MG tablet Take 1 tablet (5 mg total) by mouth daily.  ? folic acid  (FOLVITE) 1 MG tablet Take 1 tablet (1 mg total) by mouth daily.  ? furosemide (LASIX) 20 MG tablet Take 2 tablets (40 mg total) by mouth daily.  ? gabapentin (NEURONTIN) 100 MG capsule Take 100 mg by mouth daily.  ? hydrALAZINE (APRESOLINE) 50 MG tablet Take 1 tablet (50 mg total) by mouth 3 (three) times daily.  ? melatonin 5 MG TABS Take 5 mg by mouth at bedtime.  ? methimazole (TAPAZOLE) 10 MG tablet Take 2 tablets (20 mg total) by mouth 2 (two) times daily.  ? methocarbamol (ROBAXIN) 500 MG tablet Take 500 mg by mouth every 6 (six) hours.  ? Multiple Vitamin (MULTIVITAMIN) tablet Take 1 tablet by mouth daily.  ? NON FORMULARY Diet: Regular  ? potassium chloride (KLOR-CON) 10 MEQ tablet Take 1 tablet (10 mEq total) by mouth every evening.  ? pravastatin (PRAVACHOL) 20 MG tablet Take 1 tablet (20 mg total) by mouth every evening.  ? tamsulosin (FLOMAX) 0.4 MG CAPS capsule Take 0.4 mg by mouth every morning.  ? vitamin B-12 (CYANOCOBALAMIN) 500 MCG tablet Take 1 tablet (500 mcg total) by mouth daily.  ? [DISCONTINUED] cefdinir (OMNICEF) 300 MG capsule Take 1 capsule (300 mg total) by mouth every 12 (twelve) hours. X 4 days  ? [DISCONTINUED] diclofenac Sodium (VOLTAREN) 1 % GEL as needed. (Patient not taking: Reported on 05/10/2021)  ? ?No facility-administered encounter medications on file as of 05/14/2021.  ? ? ? ?SIGNIFICANT DIAGNOSTIC EXAMS ? ?PREVIOUS  ? ?05-06-21: chest x-ray: No active disease.  ? ?05-07-21: pelvic ultrasound:  ?Foley catheter appears to be in the central low pelvis in the region of the decompressed bladder. For definitive localization a pelvic CT scan is suggested. ? ?05-08-21: ct of abdomen and pelvis ?1. Focal soft tissue of the left lateral bladder wall, concerning for bladder neoplasm. Recommend cystoscopy for further evaluation. ?2. Mildly enlarged left pelvic lymph nodes, new when compared with prior exam. ?3. New indeterminate right adrenal gland nodule. Recommend adrenal protocol CT or  MRI for further evaluation. ?4. Foley catheter balloon is inflated within the prostate. Recommend repositioning. ?5. Aortic Atherosclerosis  and Emphysema . ? ?NO NEW EXAMS.  ? ?LABS REVIEWED; PREVIOUS  ? ?05-06-21: wbc 8.1; hgb 9.4; hct 29.2 mcv 92.1 plt 215; glucose 100 bun 28; creat 1.69; k+ 4.3; na++ 138; ca 8.3; GFR 40; alk phos 198; protein 6.7 albumin 2.9 urine culture: multiple species ?05-07-21: free t3: 1.8; free t4: 1.24; vitamin B 12: 227; B 1: 104.4 ?05-08-21: urine culture: 10,000 enterococcus faecalis ?05-09-21: wbc 7.0;  hgb 8.4; hct 25.7; mcv 92.4 plt 183; glucose 117; bun 20; creat 1.39; k+ 3.4; na++ 137; ca 7.7; gfr 50; mag 2.2 ? ?NO NEW LABS.  ? ?Review of Systems  ?Constitutional:  Negative for malaise/fatigue.  ?Respiratory:  Negative for cough and shortness of breath.   ?Cardiovascular:  Negative for chest pain, palpitations and leg swelling.  ?Gastrointestinal:  Negative for abdominal pain, constipation and heartburn.  ?Musculoskeletal:  Positive for back pain. Negative for joint pain and myalgias.  ?Skin: Negative.   ?Neurological:  Negative for dizziness.  ?Psychiatric/Behavioral:  The patient is not nervous/anxious.   ? ?Physical Exam ?Constitutional:   ?   General: He is not in acute distress. ?   Appearance: He is well-developed. He is morbidly obese. He is not diaphoretic.  ?Neck:  ?   Thyroid: Thyromegaly present.  ?Cardiovascular:  ?   Rate and Rhythm: Normal rate and regular rhythm.  ?   Pulses: Normal pulses.  ?   Heart sounds: Normal heart sounds.  ?Pulmonary:  ?   Effort: Pulmonary effort is normal. No respiratory distress.  ?   Breath sounds: Normal breath sounds.  ?Abdominal:  ?   General: Bowel sounds are normal. There is no distension.  ?   Palpations: Abdomen is soft.  ?   Tenderness: There is no abdominal tenderness.  ?Genitourinary: ?   Comments: foley ?Musculoskeletal:     ?   General: Normal range of motion.  ?   Cervical back: Neck supple.  ?   Comments: Has tenderness  present to palpation bilateral lower back.   ?Lymphadenopathy:  ?   Cervical: No cervical adenopathy.  ?Skin: ?   General: Skin is warm and dry.  ?   Comments:  Bilateral lower extremities discolored    ?Neurological:  ?

## 2021-05-15 DIAGNOSIS — M545 Low back pain, unspecified: Secondary | ICD-10-CM | POA: Insufficient documentation

## 2021-05-16 ENCOUNTER — Other Ambulatory Visit (HOSPITAL_COMMUNITY)
Admission: RE | Admit: 2021-05-16 | Discharge: 2021-05-16 | Disposition: A | Discharge status: Home / Self Care | Payer: Medicare Other | Source: Skilled Nursing Facility | Attending: Adult Health | Admitting: Adult Health

## 2021-05-16 DIAGNOSIS — I10 Essential (primary) hypertension: Secondary | ICD-10-CM | POA: Insufficient documentation

## 2021-05-16 LAB — HEMOGLOBIN AND HEMATOCRIT, BLOOD
HCT: 27.6 % — ABNORMAL LOW (ref 39.0–52.0)
Hemoglobin: 8.6 g/dL — ABNORMAL LOW (ref 13.0–17.0)

## 2021-05-16 LAB — BASIC METABOLIC PANEL
Anion gap: 6 (ref 5–15)
BUN: 44 mg/dL — ABNORMAL HIGH (ref 8–23)
CO2: 25 mmol/L (ref 22–32)
Calcium: 7.8 mg/dL — ABNORMAL LOW (ref 8.9–10.3)
Chloride: 108 mmol/L (ref 98–111)
Creatinine, Ser: 1.44 mg/dL — ABNORMAL HIGH (ref 0.61–1.24)
GFR, Estimated: 48 mL/min — ABNORMAL LOW (ref >60)
Glucose, Bld: 123 mg/dL — ABNORMAL HIGH (ref 70–99)
Potassium: 3.4 mmol/L — ABNORMAL LOW (ref 3.5–5.1)
Sodium: 139 mmol/L (ref 135–145)

## 2021-05-18 ENCOUNTER — Emergency Department (HOSPITAL_COMMUNITY)
Admission: EM | Admit: 2021-05-18 | Discharge: 2021-05-18 | Disposition: A | Discharge status: Skilled Nursing Facility | Payer: Medicare Other | Attending: Emergency Medicine | Admitting: Emergency Medicine

## 2021-05-18 ENCOUNTER — Telehealth: Payer: Self-pay | Admitting: Adult Health

## 2021-05-18 ENCOUNTER — Other Ambulatory Visit: Payer: Self-pay

## 2021-05-18 ENCOUNTER — Encounter (HOSPITAL_COMMUNITY): Payer: Self-pay | Admitting: Emergency Medicine

## 2021-05-18 DIAGNOSIS — Z79899 Other long term (current) drug therapy: Secondary | ICD-10-CM | POA: Diagnosis not present

## 2021-05-18 DIAGNOSIS — E039 Hypothyroidism, unspecified: Secondary | ICD-10-CM | POA: Diagnosis not present

## 2021-05-18 DIAGNOSIS — D649 Anemia, unspecified: Secondary | ICD-10-CM | POA: Diagnosis not present

## 2021-05-18 DIAGNOSIS — T83091A Other mechanical complication of indwelling urethral catheter, initial encounter: Secondary | ICD-10-CM | POA: Insufficient documentation

## 2021-05-18 DIAGNOSIS — Z87891 Personal history of nicotine dependence: Secondary | ICD-10-CM | POA: Diagnosis not present

## 2021-05-18 DIAGNOSIS — I129 Hypertensive chronic kidney disease with stage 1 through stage 4 chronic kidney disease, or unspecified chronic kidney disease: Secondary | ICD-10-CM | POA: Insufficient documentation

## 2021-05-18 DIAGNOSIS — N1832 Chronic kidney disease, stage 3b: Secondary | ICD-10-CM | POA: Insufficient documentation

## 2021-05-18 DIAGNOSIS — Z7982 Long term (current) use of aspirin: Secondary | ICD-10-CM | POA: Insufficient documentation

## 2021-05-18 DIAGNOSIS — I2510 Atherosclerotic heart disease of native coronary artery without angina pectoris: Secondary | ICD-10-CM | POA: Insufficient documentation

## 2021-05-18 DIAGNOSIS — T839XXA Unspecified complication of genitourinary prosthetic device, implant and graft, initial encounter: Secondary | ICD-10-CM

## 2021-05-18 DIAGNOSIS — Z8616 Personal history of COVID-19: Secondary | ICD-10-CM | POA: Insufficient documentation

## 2021-05-18 DIAGNOSIS — T83098A Other mechanical complication of other indwelling urethral catheter, initial encounter: Secondary | ICD-10-CM | POA: Diagnosis not present

## 2021-05-18 LAB — URINALYSIS, ROUTINE W REFLEX MICROSCOPIC
Bilirubin Urine: NEGATIVE
Glucose, UA: NEGATIVE mg/dL
Ketones, ur: NEGATIVE mg/dL
Nitrite: NEGATIVE
Protein, ur: 30 mg/dL — AB
Specific Gravity, Urine: 1.013 (ref 1.005–1.030)
WBC, UA: >50 WBC/hpf — ABNORMAL HIGH (ref 0–5)
pH: 5 (ref 5.0–8.0)

## 2021-05-18 LAB — CBC WITH DIFFERENTIAL/PLATELET
Abs Immature Granulocytes: 0.7 10*3/uL — ABNORMAL HIGH (ref 0.00–0.07)
Basophils Absolute: 0.1 10*3/uL (ref 0.0–0.1)
Basophils Relative: 1 %
Eosinophils Absolute: 0.2 10*3/uL (ref 0.0–0.5)
Eosinophils Relative: 3 %
HCT: 30.9 % — ABNORMAL LOW (ref 39.0–52.0)
Hemoglobin: 9.6 g/dL — ABNORMAL LOW (ref 13.0–17.0)
Immature Granulocytes: 7 %
Lymphocytes Relative: 18 %
Lymphs Abs: 1.7 10*3/uL (ref 0.7–4.0)
MCH: 28.4 pg (ref 26.0–34.0)
MCHC: 31.1 g/dL (ref 30.0–36.0)
MCV: 91.4 fL (ref 80.0–100.0)
Monocytes Absolute: 0.9 10*3/uL (ref 0.1–1.0)
Monocytes Relative: 9 %
Neutro Abs: 6 10*3/uL (ref 1.7–7.7)
Neutrophils Relative %: 62 %
Platelets: 256 10*3/uL (ref 150–400)
RBC: 3.38 MIL/uL — ABNORMAL LOW (ref 4.22–5.81)
RDW: 14.1 % (ref 11.5–15.5)
WBC: 10.1 10*3/uL (ref 4.0–10.5)
nRBC: 0 % (ref 0.0–0.2)

## 2021-05-18 LAB — BASIC METABOLIC PANEL
Anion gap: 5 (ref 5–15)
BUN: 34 mg/dL — ABNORMAL HIGH (ref 8–23)
CO2: 25 mmol/L (ref 22–32)
Calcium: 7.9 mg/dL — ABNORMAL LOW (ref 8.9–10.3)
Chloride: 109 mmol/L (ref 98–111)
Creatinine, Ser: 1.4 mg/dL — ABNORMAL HIGH (ref 0.61–1.24)
GFR, Estimated: 50 mL/min — ABNORMAL LOW (ref >60)
Glucose, Bld: 123 mg/dL — ABNORMAL HIGH (ref 70–99)
Potassium: 4.1 mmol/L (ref 3.5–5.1)
Sodium: 139 mmol/L (ref 135–145)

## 2021-05-18 MED ORDER — FENTANYL CITRATE PF 50 MCG/ML IJ SOSY
25.0000 ug | PREFILLED_SYRINGE | Freq: Once | INTRAMUSCULAR | Status: AC
  Administered 2021-05-18: 25 ug via INTRAVENOUS
  Filled 2021-05-18: qty 1

## 2021-05-18 NOTE — ED Notes (Signed)
After foley catheter irrigation, pts pain went from 10 to zero  ?

## 2021-05-18 NOTE — ED Provider Notes (Incomplete Revision)
?[facility] ?Provider Note ? ? ?CSN: [id number] ?Arrival date & time: 05/18/21  1313 ? ?  ? ?History ?Chief Complaint  ?Patient presents with  ? Groin Pain  ?  Foley catheter   ? ?Derek Foster is a 86 y.o. male with history of hypertension, chronic kidney disease, hyperlipidemia who presents to the emergency department with a Foley catheter problem.  This has been ongoing for a week.  Patient was recently discharged from the hospital on 05/09/2021.  Patient had a new catheter placed on 05/08/2021.  This was an 70 [place] coud? catheter.  At that time he had signs of urinary infection.  Culture was sent.  Over the last week patient is complaining of pain primarily when he has to urinate around the catheter site.  He does not have any pain otherwise.  He does state that urine is draining from the catheter into the bag.  No fever or chills.  No abdominal pain, nausea, vomiting, diarrhea, chest pain, shortness of breath. ? ? ?Groin Pain ? ? ?  ? ?Home Medications ?Prior to Admission medications   ?Medication Sig Start Date End Date Taking? Authorizing Provider  ?acetaminophen (TYLENOL) 325 MG tablet Take 325 mg by mouth every 6 (six) hours as needed for mild pain.   Yes [provider]  ?Amino Acids-Protein Hydrolys (FEEDING SUPPLEMENT, PRO-STAT SUGAR FREE 64,) LIQD Take 30 mLs by mouth 3 (three) times daily with meals.   Yes [provider]  ?aspirin EC 81 MG tablet Take 81 mg by mouth daily. Swallow whole.   Yes [provider]  ?carvedilol (COREG) 6.25 MG tablet Take 1.5 tablets (9.375 mg total) by mouth 2 (two) times daily. 10/01/20  Yes Dunn, Nedra Hai, PA-C  ?Coenzyme Q10 200 MG capsule Take 200 mg by mouth daily.   Yes [provider]  ?finasteride (PROSCAR) 5 MG tablet Take 1 tablet (5 mg total) by mouth daily. 12/20/20  Yes Irine Seal, MD  ?folic acid (FOLVITE) 1 MG tablet Take 1 tablet (1 mg total) by mouth daily. 05/10/21  Yes Tat, Shanon Brow, MD  ?furosemide  (LASIX) 40 MG tablet Take 40 mg by mouth daily.   Yes [provider]  ?gabapentin (NEURONTIN) 100 MG capsule Take 100 mg by mouth daily. 05/16/20  Yes [provider]  ?hydrALAZINE (APRESOLINE) 50 MG tablet Take 1 tablet (50 mg total) by mouth 3 (three) times daily. 10/01/20 01/21/48 Yes Dunn, Dayna N, PA-C  ?melatonin 5 MG TABS Take 5 mg by mouth at bedtime.   Yes [provider]  ?methimazole (TAPAZOLE) 10 MG tablet Take 2 tablets (20 mg total) by mouth 2 (two) times daily. 07/19/19  Yes Gerlene Fee, NP  ?methocarbamol (ROBAXIN) 500 MG tablet Take 500 mg by mouth every 6 (six) hours.   Yes [provider]  ?Multiple Vitamin (MULTIVITAMIN) tablet Take 1 tablet by mouth daily.   Yes [provider]  ?polyethylene glycol (MIRALAX / GLYCOLAX) 17 g packet Take 17 g by mouth daily.   Yes [provider]  ?potassium chloride SA (KLOR-CON M) 20 MEQ tablet Take 20 mEq by mouth 2 (two) times daily.   Yes [provider]  ?pravastatin (PRAVACHOL) 20 MG tablet Take 1 tablet (20 mg total) by mouth every evening. 10/04/20 01/21/48 Yes Imogene Burn, PA-C  ?tamsulosin (FLOMAX) 0.4 MG CAPS capsule Take 0.4 mg by mouth every morning. 04/10/21  Yes [provider]  ?vitamin B-12 (CYANOCOBALAMIN) 500 MCG tablet Take  1 tablet (500 mcg total) by mouth daily. 05/09/21  Yes TatShanon Brow, MD  ?Baruch Gouty Diet: Regular    [provider]  ?   ? ?Allergies    ?Indomethacin, Iodine-131, and Lidocaine-menthol   ? ?Review of Systems   ?Review of Systems  ?All other systems reviewed and are negative. ? ?Physical Exam ?Updated Vital Signs ?BP (!) 159/58   Pulse 82   Temp 98.3 ?F (36.8 ?C) (Oral)   Resp 17   Ht 6' (1.829 m)   Wt (!) 141.1 kg   SpO2 96%   BMI 42.18 kg/m?  ?Physical Exam ?Vitals and nursing note reviewed.  ?Constitutional:   ?   General: He is not in acute distress. ?   Appearance: Normal appearance.  ?HENT:  ?   Head: Normocephalic and  atraumatic.  ?Eyes:  ?   General:     ?   Right eye: No discharge.     ?   Left eye: No discharge.  ?Cardiovascular:  ?   Comments: Regular rate and rhythm.  S1/S2 are distinct without any evidence of murmur, rubs, or gallops.  Radial pulses are 2+ bilaterally.  Dorsalis pedis pulses are 2+ bilaterally.  No evidence of pedal edema. ?Pulmonary:  ?   Comments: Clear to auscultation bilaterally.  Normal effort.  No respiratory distress.  No evidence of wheezes, rales, or rhonchi heard throughout. ?Abdominal:  ?   General: Abdomen is flat. Bowel sounds are normal. There is no distension.  ?   Tenderness: There is no abdominal tenderness. There is no guarding or rebound.  ?Genitourinary: ?   Comments: Foley catheter in place in the urethral meatus.  Mild amount of dried blood around the urethral meatus.  No other lesions.  There is yellow urine in the bag at the bedside. ?Musculoskeletal:     ?   General: Normal range of motion.  ?   Cervical back: Neck supple.  ?Skin: ?   General: Skin is warm and dry.  ?   Findings: No rash.  ?Neurological:  ?   General: No focal deficit present.  ?   Mental Status: He is alert.  ?Psychiatric:     ?   Mood and Affect: Mood normal.     ?   Behavior: Behavior normal.  ? ? ?ED Results / Procedures / Treatments   ?Labs ?(all labs ordered are listed, but only abnormal results are displayed) ?Labs Reviewed  ?CBC WITH DIFFERENTIAL/PLATELET - Abnormal; Notable for the following components:  ?    Result Value  ? RBC 3.38 (*)   ? Hemoglobin 9.6 (*)   ? HCT 30.9 (*)   ? Abs Immature Granulocytes 0.70 (*)   ? All other components within normal limits  ?BASIC METABOLIC PANEL - Abnormal; Notable for the following components:  ? Glucose, Bld 123 (*)   ? BUN 34 (*)   ? Creatinine, Ser 1.40 (*)   ? Calcium 7.9 (*)   ? GFR, Estimated 50 (*)   ? All other components within normal limits  ?URINALYSIS, ROUTINE W REFLEX MICROSCOPIC - Abnormal; Notable for the following components:  ? APPearance CLOUDY (*)    ? Hgb urine dipstick SMALL (*)   ? Protein, ur 30 (*)   ? Leukocytes,Ua LARGE (*)   ? WBC, UA >50 (*)   ? Bacteria, UA FEW (*)   ? All other components within normal limits  ?URINE CULTURE  ? ? ?EKG ?None ? ?Radiology ?No results found. ? ?  Procedures ?Procedures  ? ? ?Medications Ordered in ED ?Medications  ?fentaNYL (SUBLIMAZE) injection 25 mcg (25 mcg Intravenous Given 05/18/21 1542)  ? ? ?ED Course/ Medical Decision Making/ A&P ?Clinical Course as of 05/18/21 1644  ?Sat May 18, 2021  ?1546 Patient's pain is improved after fentanyl.  We discussed irrigating the catheter first and if he still having pain after irrigation we can talk about switching the catheter.  Patient wishes to move forward with that. [CF]  ?1602 Patient's other was irrigated successfully.  Nurse deflated the balloon and had a lot of urine come out.  Catheter was successfully irrigated and patient went from a 10 out of 10 pain to a 0.  We will monitor for some time to ensure complete resolution of pain and will likely have him follow-up with urology upon discharge. [CF]  ?  ?Clinical Course User Index ?[CF] Myna Bright M, PA-C  ? ?                        ?Medical Decision Making ?Amount and/or Complexity of Data Reviewed ?Labs: ordered. ? ?Risk ?Prescription drug management. ? ? ?This patient presents to the ED for concern of Foley catheter problem, this involves an extensive number of treatment options, and is a complaint that carries with it a high risk of complications and morbidity.  The differential diagnosis includes symptomatic cystitis, Foley catheter kink, Foley catheter malfunction, urinary obstruction. ? ? ?Co morbidities that complicate the patient evaluation ? ?Past Medical History:  ?Diagnosis Date  ? AKI (acute kidney injury) ([place]) 02/12/2019  ? CAD (coronary artery disease)   ? a. cath 01/27/2009 : s/p promus DES to LAD and medical managment of 70-80% mRCA  ? Chronic kidney disease, stage 3b ([place])   ? COVID-19   ? Essential  hypertension   ? GI bleeding   ? Hematuria   ? Hyperthyroidism   ? Hypothyroidism   ? Mild atherosclerosis of carotid artery   ? Mild pulmonary hypertension ([place])   ? Mixed hyperlipidemia   ? NSTEMI (non-ST elevated myocar

## 2021-05-18 NOTE — ED Provider Notes (Addendum)
?[facility] ?Provider Note ? ? ?CSN: [id number] ?Arrival date & time: 05/18/21  1313 ? ?  ? ?History ?Chief Complaint  ?Patient presents with  ? Groin Pain  ?  Foley catheter   ? ?Derek Foster is a 86 y.o. male with history of hypertension, chronic kidney disease, hyperlipidemia who presents to the emergency department with a Foley catheter problem.  This has been ongoing for a week.  Patient was recently discharged from the hospital on 05/09/2021.  Patient had a new catheter placed on 05/08/2021.  This was an 34 [place] coud? catheter.  At that time he had signs of urinary infection.  Culture was sent.  Over the last week patient is complaining of pain primarily when he has to urinate around the catheter site.  He does not have any pain otherwise.  He does state that urine is draining from the catheter into the bag.  No fever or chills.  No abdominal pain, nausea, vomiting, diarrhea, chest pain, shortness of breath. ? ? ?Groin Pain ? ? ?  ? ?Home Medications ?Prior to Admission medications   ?Medication Sig Start Date End Date Taking? Authorizing Provider  ?acetaminophen (TYLENOL) 325 MG tablet Take 325 mg by mouth every 6 (six) hours as needed for mild pain.   Yes [provider]  ?Amino Acids-Protein Hydrolys (FEEDING SUPPLEMENT, PRO-STAT SUGAR FREE 64,) LIQD Take 30 mLs by mouth 3 (three) times daily with meals.   Yes [provider]  ?aspirin EC 81 MG tablet Take 81 mg by mouth daily. Swallow whole.   Yes [provider]  ?carvedilol (COREG) 6.25 MG tablet Take 1.5 tablets (9.375 mg total) by mouth 2 (two) times daily. 10/01/20  Yes Dunn, Nedra Hai, PA-C  ?Coenzyme Q10 200 MG capsule Take 200 mg by mouth daily.   Yes [provider]  ?finasteride (PROSCAR) 5 MG tablet Take 1 tablet (5 mg total) by mouth daily. 12/20/20  Yes Irine Seal, MD  ?folic acid (FOLVITE) 1 MG tablet Take 1 tablet (1 mg total) by mouth daily. 05/10/21  Yes Tat, Shanon Brow, MD  ?furosemide  (LASIX) 40 MG tablet Take 40 mg by mouth daily.   Yes [provider]  ?gabapentin (NEURONTIN) 100 MG capsule Take 100 mg by mouth daily. 05/16/20  Yes [provider]  ?hydrALAZINE (APRESOLINE) 50 MG tablet Take 1 tablet (50 mg total) by mouth 3 (three) times daily. 10/01/20 01/21/48 Yes Dunn, Dayna N, PA-C  ?melatonin 5 MG TABS Take 5 mg by mouth at bedtime.   Yes [provider]  ?methimazole (TAPAZOLE) 10 MG tablet Take 2 tablets (20 mg total) by mouth 2 (two) times daily. 07/19/19  Yes Gerlene Fee, NP  ?methocarbamol (ROBAXIN) 500 MG tablet Take 500 mg by mouth every 6 (six) hours.   Yes [provider]  ?Multiple Vitamin (MULTIVITAMIN) tablet Take 1 tablet by mouth daily.   Yes [provider]  ?polyethylene glycol (MIRALAX / GLYCOLAX) 17 g packet Take 17 g by mouth daily.   Yes [provider]  ?potassium chloride SA (KLOR-CON M) 20 MEQ tablet Take 20 mEq by mouth 2 (two) times daily.   Yes [provider]  ?pravastatin (PRAVACHOL) 20 MG tablet Take 1 tablet (20 mg total) by mouth every evening. 10/04/20 01/21/48 Yes Imogene Burn, PA-C  ?tamsulosin (FLOMAX) 0.4 MG CAPS capsule Take 0.4 mg by mouth every morning. 04/10/21  Yes [provider]  ?vitamin B-12 (CYANOCOBALAMIN) 500 MCG tablet Take  1 tablet (500 mcg total) by mouth daily. 05/09/21  Yes TatShanon Brow, MD  ?Baruch Gouty Diet: Regular    [provider]  ?   ? ?Allergies    ?Indomethacin, Iodine-131, and Lidocaine-menthol   ? ?Review of Systems   ?Review of Systems  ?All other systems reviewed and are negative. ? ?Physical Exam ?Updated Vital Signs ?BP (!) 159/58   Pulse 82   Temp 98.3 ?F (36.8 ?C) (Oral)   Resp 17   Ht 6' (1.829 m)   Wt (!) 141.1 kg   SpO2 96%   BMI 42.18 kg/m?  ?Physical Exam ?Vitals and nursing note reviewed.  ?Constitutional:   ?   General: He is not in acute distress. ?   Appearance: Normal appearance.  ?HENT:  ?   Head: Normocephalic and  atraumatic.  ?Eyes:  ?   General:     ?   Right eye: No discharge.     ?   Left eye: No discharge.  ?Cardiovascular:  ?   Comments: Regular rate and rhythm.  S1/S2 are distinct without any evidence of murmur, rubs, or gallops.  Radial pulses are 2+ bilaterally.  Dorsalis pedis pulses are 2+ bilaterally.  No evidence of pedal edema. ?Pulmonary:  ?   Comments: Clear to auscultation bilaterally.  Normal effort.  No respiratory distress.  No evidence of wheezes, rales, or rhonchi heard throughout. ?Abdominal:  ?   General: Abdomen is flat. Bowel sounds are normal. There is no distension.  ?   Tenderness: There is no abdominal tenderness. There is no guarding or rebound.  ?Genitourinary: ?   Comments: Foley catheter in place in the urethral meatus.  Mild amount of dried blood around the urethral meatus.  No other lesions.  There is yellow urine in the bag at the bedside. ?Musculoskeletal:     ?   General: Normal range of motion.  ?   Cervical back: Neck supple.  ?Skin: ?   General: Skin is warm and dry.  ?   Findings: No rash.  ?Neurological:  ?   General: No focal deficit present.  ?   Mental Status: He is alert.  ?Psychiatric:     ?   Mood and Affect: Mood normal.     ?   Behavior: Behavior normal.  ? ? ?ED Results / Procedures / Treatments   ?Labs ?(all labs ordered are listed, but only abnormal results are displayed) ?Labs Reviewed  ?CBC WITH DIFFERENTIAL/PLATELET - Abnormal; Notable for the following components:  ?    Result Value  ? RBC 3.38 (*)   ? Hemoglobin 9.6 (*)   ? HCT 30.9 (*)   ? Abs Immature Granulocytes 0.70 (*)   ? All other components within normal limits  ?BASIC METABOLIC PANEL - Abnormal; Notable for the following components:  ? Glucose, Bld 123 (*)   ? BUN 34 (*)   ? Creatinine, Ser 1.40 (*)   ? Calcium 7.9 (*)   ? GFR, Estimated 50 (*)   ? All other components within normal limits  ?URINALYSIS, ROUTINE W REFLEX MICROSCOPIC - Abnormal; Notable for the following components:  ? APPearance CLOUDY (*)    ? Hgb urine dipstick SMALL (*)   ? Protein, ur 30 (*)   ? Leukocytes,Ua LARGE (*)   ? WBC, UA >50 (*)   ? Bacteria, UA FEW (*)   ? All other components within normal limits  ?URINE CULTURE  ? ? ?EKG ?None ? ?Radiology ?No results found. ? ?  Procedures ?Procedures  ? ? ?Medications Ordered in ED ?Medications  ?fentaNYL (SUBLIMAZE) injection 25 mcg (25 mcg Intravenous Given 05/18/21 1542)  ? ? ?ED Course/ Medical Decision Making/ A&P ?Clinical Course as of 05/18/21 1644  ?Sat May 18, 2021  ?1546 Patient's pain is improved after fentanyl.  We discussed irrigating the catheter first and if he still having pain after irrigation we can talk about switching the catheter.  Patient wishes to move forward with that. [CF]  ?1602 Patient's other was irrigated successfully.  Nurse deflated the balloon and had a lot of urine come out.  Catheter was successfully irrigated and patient went from a 10 out of 10 pain to a 0.  We will monitor for some time to ensure complete resolution of pain and will likely have him follow-up with urology upon discharge. [CF]  ?  ?Clinical Course User Index ?[CF] Myna Bright M, PA-C  ? ?                        ?Medical Decision Making ?Amount and/or Complexity of Data Reviewed ?Labs: ordered. ? ?Risk ?Prescription drug management. ? ? ?This patient presents to the ED for concern of Foley catheter problem, this involves an extensive number of treatment options, and is a complaint that carries with it a high risk of complications and morbidity.  The differential diagnosis includes symptomatic cystitis, Foley catheter kink, Foley catheter malfunction, urinary obstruction. ? ? ?Co morbidities that complicate the patient evaluation ? ?Past Medical History:  ?Diagnosis Date  ? AKI (acute kidney injury) ([place]) 02/12/2019  ? CAD (coronary artery disease)   ? a. cath 01/27/2009 : s/p promus DES to LAD and medical managment of 70-80% mRCA  ? Chronic kidney disease, stage 3b ([place])   ? COVID-19   ? Essential  hypertension   ? GI bleeding   ? Hematuria   ? Hyperthyroidism   ? Hypothyroidism   ? Mild atherosclerosis of carotid artery   ? Mild pulmonary hypertension ([place])   ? Mixed hyperlipidemia   ? NSTEMI (non-ST elevated myocar

## 2021-05-18 NOTE — Telephone Encounter (Signed)
Nurse called to report that this resident is having severe groin pain. He has a foley and it appears to be draining well and his abd is not distended. His pain is severe and can not be controlled. Order given to send to the ER ?renal stone ?

## 2021-05-18 NOTE — Discharge Instructions (Signed)
It seems that your Foley catheter was kinked inside your urethra.  We fix this today.  We have sent off a urine culture.  Your urine does appear infected however you are not having any symptoms we will defer antibiotics at this time.  Please follow-up with your primary care provider for further evaluation.  Return to the emergency department for any worsening symptoms. ?

## 2021-05-18 NOTE — ED Notes (Signed)
Patient reports coughing and feeling severe pain in groin/penis. Attempted to flush without success. Deflated balloon and urine seeping out. Adjusted cath and inflated balloon with 57m NS and patient voices pain relief at this time. Foley cath draining at this time.  ?

## 2021-05-18 NOTE — ED Triage Notes (Signed)
Pt arrived from [facility] with c/o groin pain for 1 week. Pt states States "when I feel like I have to pee, that is when it hurts the worse"  ?

## 2021-05-18 NOTE — ED Notes (Signed)
Pt has a foley catheter  ?

## 2021-05-18 NOTE — ED Notes (Signed)
EDPA at [facility]. Procedure explained. Pain 10/10. Pt alert, NAD, calm, interactive.  ?

## 2021-05-20 ENCOUNTER — Encounter: Payer: Self-pay | Admitting: Adult Health

## 2021-05-20 ENCOUNTER — Non-Acute Institutional Stay (SKILLED_NURSING_FACILITY): Payer: PPO | Admitting: Adult Health

## 2021-05-20 ENCOUNTER — Other Ambulatory Visit (HOSPITAL_COMMUNITY)
Admission: RE | Admit: 2021-05-20 | Discharge: 2021-05-20 | Disposition: A | Discharge status: Home / Self Care | Payer: Medicare Other | Source: Skilled Nursing Facility | Attending: Adult Health | Admitting: Adult Health

## 2021-05-20 DIAGNOSIS — N401 Enlarged prostate with lower urinary tract symptoms: Secondary | ICD-10-CM | POA: Diagnosis not present

## 2021-05-20 DIAGNOSIS — N138 Other obstructive and reflux uropathy: Secondary | ICD-10-CM | POA: Diagnosis not present

## 2021-05-20 DIAGNOSIS — E876 Hypokalemia: Secondary | ICD-10-CM | POA: Insufficient documentation

## 2021-05-20 LAB — POTASSIUM: Potassium: 4.7 mmol/L (ref 3.5–5.1)

## 2021-05-20 NOTE — Progress Notes (Signed)
?Location:  [facility] ?Nursing Home Room Number: [id number] ?Place of Service:  SNF (31) ? ? ?CODE STATUS: DNR ? ?Allergies  ?Allergen Reactions  ? Indomethacin Anaphylaxis  ?  But tolerates ibuprofen, aleve  ? Iodine-131 Anaphylaxis  ? Lidocaine-Menthol   ?  Other reaction(s): Bradycardia  ? ? ?Chief Complaint  ?Patient presents with  ? Acute Visit  ?  ER Follow up  ? ? ?HPI: ? ?He presented to the ED with groin pain. After his workup he was found to have his foley coiled in hiss urethra. This was fixed and has resolved his pain. He is participating in therapy today.  ? ?Past Medical History:  ?Diagnosis Date  ? AKI (acute kidney injury) ([place]) 02/12/2019  ? CAD (coronary artery disease)   ? a. cath 01/27/2009 : s/p promus DES to LAD and medical managment of 70-80% mRCA  ? Chronic kidney disease, stage 3b ([place])   ? COVID-19   ? Essential hypertension   ? GI bleeding   ? Hematuria   ? Hyperthyroidism   ? Hypothyroidism   ? Mild atherosclerosis of carotid artery   ? Mild pulmonary hypertension ([place])   ? Mixed hyperlipidemia   ? NSTEMI (non-ST elevated myocardial infarction) [facility]) 2011  ? PAF (paroxysmal atrial fibrillation) ([place])   ? ? ?Past Surgical History:  ?Procedure Laterality Date  ? BOWEL RESECTION N/A 06/28/2019  ? Procedure: SMALL BOWEL RESECTION;  Surgeon: Virl Cagey, MD;  Location: [facility];  Service: General;  Laterality: N/A;  ? CHOLECYSTECTOMY    ? CORONARY STENT PLACEMENT    ? CYSTOSCOPY/RETROGRADE/URETEROSCOPY Bilateral 03/09/2019  ? Procedure: CYSTOSCOPY, Clot Evacuation, Fulguration;  Surgeon: Festus Aloe, MD;  Location: [facility];  Service: Urology;  Laterality: Bilateral;  ? HERNIA REPAIR    ? LYSIS OF ADHESION N/A 06/28/2019  ? Procedure: LYSIS OF ADHESION;  Surgeon: Virl Cagey, MD;  Location: [facility];  Service: General;  Laterality: N/A;  ? VENTRAL HERNIA REPAIR N/A 06/28/2019  ? Procedure: HERNIA REPAIR VENTRAL ADULT WITH VICRYL MESH OVERLAY;  Surgeon: Virl Cagey, MD;   Location: [facility];  Service: General;  Laterality: N/A;  ? ? ?Social History  ? ?Socioeconomic History  ? Marital status: Married  ?  Spouse name: Not on file  ? Number of children: Not on file  ? Years of education: Not on file  ? Highest education level: Not on file  ?Occupational History  ? Occupation: Charity fundraiser  ?Tobacco Use  ? Smoking status: Former  ?  Types: Cigarettes  ?  Quit date: 01/20/1957  ?  Years since quitting: 64.3  ? Smokeless tobacco: Never  ?Vaping Use  ? Vaping Use: Never used  ?Substance and Sexual Activity  ? Alcohol use: No  ? Drug use: Never  ? Sexual activity: Not on file  ?Other Topics Concern  ? Not on file  ?Social History Narrative  ? Quit smoking about 62 years ago.  ? ?Social Determinants of Health  ? ?Financial Resource Strain: Not on file  ?Food Insecurity: Not on file  ?Transportation Needs: Not on file  ?Physical Activity: Not on file  ?Stress: Not on file  ?Social Connections: Not on file  ?Intimate Partner Violence: Not on file  ? ?Family History  ?Problem Relation Age of Onset  ? Heart attack Father   ? Lung cancer Sister   ? ? ? ? ?VITAL SIGNS ?BP (!) 130/58   Pulse 80   Temp 98 ?F (36.7 ?C) (Skin)  Ht 6' (1.829 m)   Wt (!) 306 lb 4.8 oz (138.9 kg)   SpO2 96%   BMI 41.54 kg/m?  ? ?Outpatient Encounter Medications as of 05/20/2021  ?Medication Sig  ? acetaminophen (TYLENOL) 325 MG tablet Take 325 mg by mouth every 6 (six) hours as needed for mild pain.  ? Amino Acids-Protein Hydrolys (FEEDING SUPPLEMENT, PRO-STAT SUGAR FREE 64,) LIQD Take 30 mLs by mouth 3 (three) times daily with meals.  ? aspirin EC 81 MG tablet Take 81 mg by mouth daily. Swallow whole.  ? Balsam [place]-Castor Oil (VENELEX) OINT Apply topical to sacrum and bilateral buttocks three times daily.  ? carvedilol (COREG) 6.25 MG tablet Take 1.5 tablets (9.375 mg total) by mouth 2 (two) times daily.  ? Coenzyme Q10 200 MG capsule Take 200 mg by mouth daily.  ? finasteride (PROSCAR) 5 MG tablet Take 1 tablet (5 mg  total) by mouth daily.  ? folic acid (FOLVITE) 1 MG tablet Take 1 tablet (1 mg total) by mouth daily.  ? furosemide (LASIX) 40 MG tablet Take 40 mg by mouth daily.  ? gabapentin (NEURONTIN) 100 MG capsule Take 100 mg by mouth daily.  ? hydrALAZINE (APRESOLINE) 50 MG tablet Take 1 tablet (50 mg total) by mouth 3 (three) times daily.  ? melatonin 5 MG TABS Take 5 mg by mouth at bedtime.  ? methimazole (TAPAZOLE) 10 MG tablet Take 2 tablets (20 mg total) by mouth 2 (two) times daily.  ? methocarbamol (ROBAXIN) 500 MG tablet Take 500 mg by mouth every 6 (six) hours.  ? Multiple Vitamin (MULTIVITAMIN) tablet Take 1 tablet by mouth daily.  ? NON FORMULARY Diet: Regular  ? polyethylene glycol (MIRALAX / GLYCOLAX) 17 g packet Take 17 g by mouth daily.  ? potassium chloride SA (KLOR-CON M) 20 MEQ tablet Take 20 mEq by mouth 2 (two) times daily.  ? pravastatin (PRAVACHOL) 20 MG tablet Take 1 tablet (20 mg total) by mouth every evening.  ? tamsulosin (FLOMAX) 0.4 MG CAPS capsule Take 0.4 mg by mouth every morning.  ? vitamin B-12 (CYANOCOBALAMIN) 500 MCG tablet Take 1 tablet (500 mcg total) by mouth daily.  ? ?No facility-administered encounter medications on file as of 05/20/2021.  ? ? ? ?SIGNIFICANT DIAGNOSTIC EXAMS ? ?PREVIOUS  ? ?05-06-21: chest x-ray: No active disease.  ? ?05-07-21: pelvic ultrasound:  ?Foley catheter appears to be in the central low pelvis in the region of the decompressed bladder. For definitive localization a pelvic CT scan is suggested. ? ?05-08-21: ct of abdomen and pelvis ?1. Focal soft tissue of the left lateral bladder wall, concerning for bladder neoplasm. Recommend cystoscopy for further evaluation. ?2. Mildly enlarged left pelvic lymph nodes, new when compared with prior exam. ?3. New indeterminate right adrenal gland nodule. Recommend adrenal protocol CT or MRI for further evaluation. ?4. Foley catheter balloon is inflated within the prostate. Recommend repositioning. ?5. Aortic Atherosclerosis   and Emphysema . ? ?NO NEW EXAMS.  ? ?LABS REVIEWED; PREVIOUS  ? ?05-06-21: wbc 8.1; hgb 9.4; hct 29.2 mcv 92.1 plt 215; glucose 100 bun 28; creat 1.69; k+ 4.3; na++ 138; ca 8.3; GFR 40; alk phos 198; protein 6.7 albumin 2.9 urine culture: multiple species ?05-07-21: free t3: 1.8; free t4: 1.24; vitamin B 12: 227; B 1: 104.4 ?05-08-21: urine culture: 10,000 enterococcus faecalis ?05-09-21: wbc 7.0; hgb 8.4; hct 25.7; mcv 92.4 plt 183; glucose 117; bun 20; creat 1.39; k+ 3.4; na++ 137; ca 7.7; gfr 50; mag 2.2 ? ?  NO NEW LABS.  ? ?Review of Systems  ?Constitutional:  Negative for malaise/fatigue.  ?Respiratory:  Negative for cough and shortness of breath.   ?Cardiovascular:  Negative for chest pain, palpitations and leg swelling.  ?Gastrointestinal:  Negative for abdominal pain, constipation and heartburn.  ?Musculoskeletal:  Positive for back pain. Negative for joint pain and myalgias.  ?Skin: Negative.   ?Neurological:  Negative for dizziness.  ?Psychiatric/Behavioral:  The patient is not nervous/anxious.   ? ?Physical Exam ?Constitutional:   ?   General: He is not in acute distress. ?   Appearance: He is well-developed. He is morbidly obese. He is not diaphoretic.  ?Neck:  ?   Thyroid: Thyromegaly present.  ?Cardiovascular:  ?   Rate and Rhythm: Normal rate and regular rhythm.  ?   Pulses: Normal pulses.  ?   Heart sounds: Normal heart sounds.  ?Pulmonary:  ?   Effort: Pulmonary effort is normal. No respiratory distress.  ?   Breath sounds: Normal breath sounds.  ?Abdominal:  ?   General: Bowel sounds are normal. There is no distension.  ?   Palpations: Abdomen is soft.  ?   Tenderness: There is no abdominal tenderness.  ?Genitourinary: ?   Comments: foley ?Musculoskeletal:     ?   General: Normal range of motion.  ?   Cervical back: Neck supple.  ?Lymphadenopathy:  ?   Cervical: No cervical adenopathy.  ?Skin: ?   General: Skin is warm and dry.  ?   Comments:  Bilateral lower extremities discolored     ?Neurological:  ?    Mental Status: He is alert and oriented to person, place, and time.  ?Psychiatric:     ?   Mood and Affect: Mood normal.  ? ? ? ? ?ASSESSMENT/ PLAN: ? ?TODAY ? ?Benign prostatic hyperplasia with urinary obstru

## 2021-05-21 LAB — URINE CULTURE: Culture: 60000 — AB

## 2021-05-22 ENCOUNTER — Telehealth: Payer: Self-pay | Admitting: *Deleted

## 2021-05-22 NOTE — Telephone Encounter (Signed)
Post ED Visit - Positive Culture Follow-up ? ?Culture report reviewed by antimicrobial stewardship pharmacist: ?[facility] Team ?'[]'$  Elenor Quinones, Pharm.D. ?'[]'$  Heide Guile, Pharm.D., BCPS AQ-ID ?'[]'$  Parks Neptune, Pharm.D., BCPS ?'[]'$  Alycia Rossetti, Pharm.D., BCPS ?'[]'$  [place], Pharm.D., BCPS, AAHIVP ?'[]'$  Legrand Como, Pharm.D., BCPS, AAHIVP ?'[]'$  Salome Arnt, PharmD, BCPS ?'[]'$  Johnnette Gourd, PharmD, BCPS ?'[]'$  Hughes Better, PharmD, BCPS ?'[]'$  Leeroy Cha, PharmD ?'[]'$  Laqueta Linden, PharmD, BCPS ?'[]'$  Albertina Parr, PharmD ? ?[facility] Team ?'[]'$  Leodis Sias, PharmD ?'[]'$  Lindell Spar, PharmD ?'[]'$  Royetta Asal, PharmD ?'[]'$  Graylin Shiver, Rph ?'[]'$  Rema Fendt) Glennon Mac, PharmD ?'[]'$  Arlyn Dunning, PharmD ?'[]'$  Netta Cedars, PharmD ?'[]'$  Dia Sitter, PharmD ?'[]'$  Leone Haven, PharmD ?'[]'$  Gretta Arab, PharmD ?'[]'$  Theodis Shove, PharmD ?'[]'$  Peggyann Juba, PharmD ?'[]'$  Reuel Boom, PharmD ? ? ?Positive urine culture ?Had follow up appt on 05/01 post ED visit and no further patient follow-up is required at this time. ? ?Ardeen Fillers ?05/22/2021, 11:18 AM ?  ?

## 2021-05-24 ENCOUNTER — Encounter: Payer: Self-pay | Admitting: Adult Health

## 2021-05-24 ENCOUNTER — Non-Acute Institutional Stay (SKILLED_NURSING_FACILITY): Payer: PPO | Admitting: Adult Health

## 2021-05-24 DIAGNOSIS — I480 Paroxysmal atrial fibrillation: Secondary | ICD-10-CM | POA: Diagnosis not present

## 2021-05-24 DIAGNOSIS — I700 Atherosclerosis of aorta: Secondary | ICD-10-CM | POA: Diagnosis not present

## 2021-05-24 DIAGNOSIS — N1832 Chronic kidney disease, stage 3b: Secondary | ICD-10-CM | POA: Diagnosis not present

## 2021-05-24 NOTE — Progress Notes (Signed)
?Location:  [place] ?Nursing Home Room Number: 160-P ?Place of Service:  SNF (31) ? ? ?CODE STATUS: DNR ? ?Allergies  ?Allergen Reactions  ? Indomethacin Anaphylaxis  ?  But tolerates ibuprofen, aleve  ? Iodine-131 Anaphylaxis  ? Lidocaine-Menthol   ?  Other reaction(s): Bradycardia  ? ? ?Chief Complaint  ?Patient presents with  ? Acute Visit  ?  Care plan meeting  ? ? ?HPI: ? ?We have come together for his care plan meeting. Family present . BIMS 15/15 mood 7/30: decreased interest in activities; trouble staying asleep; decreased energy; restless. Has had no falls. He requires extensive assist with adls. Has foley. Dietary: 306 pounds; feeds self regular diet: good appetite. Therapy: able to walk 10 feet with walker mod assist to stand; 1-2 minutes standing tolerance; easily sob; easily tired; bathing upper min assist lower body is max assist; toileting mod to max assist. He continues to be followed for his chronic illnesses including:  Aortic atherosclerosis PAF (paroxsymal atrial fibrillation)  Stage 3b chronic kidney disease (CKD) ? ?Past Medical History:  ?Diagnosis Date  ? AKI (acute kidney injury) ([place]) 02/12/2019  ? CAD (coronary artery disease)   ? a. cath 01/27/2009 : s/p promus DES to LAD and medical managment of 70-80% mRCA  ? Chronic kidney disease, stage 3b ([place])   ? COVID-19   ? Essential hypertension   ? GI bleeding   ? Hematuria   ? Hyperthyroidism   ? Hypothyroidism   ? Mild atherosclerosis of carotid artery   ? Mild pulmonary hypertension ([place])   ? Mixed hyperlipidemia   ? NSTEMI (non-ST elevated myocardial infarction) [facility]) 2011  ? PAF (paroxysmal atrial fibrillation) ([employer])   ? ? ?Past Surgical History:  ?Procedure Laterality Date  ? BOWEL RESECTION N/A 06/28/2019  ? Procedure: SMALL BOWEL RESECTION;  Surgeon: Virl Cagey, MD;  Location: [facility];  Service: General;  Laterality: N/A;  ? CHOLECYSTECTOMY    ? CORONARY STENT PLACEMENT    ? CYSTOSCOPY/RETROGRADE/URETEROSCOPY Bilateral  03/09/2019  ? Procedure: CYSTOSCOPY, Clot Evacuation, Fulguration;  Surgeon: Festus Aloe, MD;  Location: [place];  Service: Urology;  Laterality: Bilateral;  ? HERNIA REPAIR    ? LYSIS OF ADHESION N/A 06/28/2019  ? Procedure: LYSIS OF ADHESION;  Surgeon: Virl Cagey, MD;  Location: [facility];  Service: General;  Laterality: N/A;  ? VENTRAL HERNIA REPAIR N/A 06/28/2019  ? Procedure: HERNIA REPAIR VENTRAL ADULT WITH VICRYL MESH OVERLAY;  Surgeon: Virl Cagey, MD;  Location: [facility];  Service: General;  Laterality: N/A;  ? ? ?Social History  ? ?Socioeconomic History  ? Marital status: Married  ?  Spouse name: Not on file  ? Number of children: Not on file  ? Years of education: Not on file  ? Highest education level: Not on file  ?Occupational History  ? Occupation: Charity fundraiser  ?Tobacco Use  ? Smoking status: Former  ?  Types: Cigarettes  ?  Quit date: 01/20/1957  ?  Years since quitting: 64.3  ? Smokeless tobacco: Never  ?Vaping Use  ? Vaping Use: Never used  ?Substance and Sexual Activity  ? Alcohol use: No  ? Drug use: Never  ? Sexual activity: Not on file  ?Other Topics Concern  ? Not on file  ?Social History Narrative  ? Quit smoking about 62 years ago.  ? ?Social Determinants of Health  ? ?Financial Resource Strain: Not on file  ?Food Insecurity: Not on file  ?Transportation Needs: Not on file  ?  Physical Activity: Not on file  ?Stress: Not on file  ?Social Connections: Not on file  ?Intimate Partner Violence: Not on file  ? ?Family History  ?Problem Relation Age of Onset  ? Heart attack Father   ? Lung cancer Sister   ? ? ? ? ?VITAL SIGNS ?BP (!) 166/78   Pulse 91   Temp 97.7 ?F (36.5 ?C)   Resp (!) 22   Ht 6' (1.829 m)   Wt (!) 306 lb 8 oz (139 kg)   SpO2 96%   BMI 41.57 kg/m?  ? ?Outpatient Encounter Medications as of 05/24/2021  ?Medication Sig  ? acetaminophen (TYLENOL) 325 MG tablet Take 325 mg by mouth every 6 (six) hours as needed for mild pain.  ? Amino Acids-Protein Hydrolys (FEEDING  SUPPLEMENT, PRO-STAT SUGAR FREE 64,) LIQD Take 30 mLs by mouth 3 (three) times daily with meals.  ? aspirin EC 81 MG tablet Take 81 mg by mouth daily. Swallow whole.  ? Balsam [place]-Castor Oil (VENELEX) OINT Apply topical to sacrum and bilateral buttocks three times daily.  ? carvedilol (COREG) 6.25 MG tablet Take 1.5 tablets (9.375 mg total) by mouth 2 (two) times daily.  ? Coenzyme Q10 200 MG capsule Take 200 mg by mouth daily.  ? finasteride (PROSCAR) 5 MG tablet Take 1 tablet (5 mg total) by mouth daily.  ? folic acid (FOLVITE) 1 MG tablet Take 1 tablet (1 mg total) by mouth daily.  ? furosemide (LASIX) 40 MG tablet Take 40 mg by mouth daily.  ? gabapentin (NEURONTIN) 100 MG capsule Take 100 mg by mouth daily.  ? hydrALAZINE (APRESOLINE) 50 MG tablet Take 1 tablet (50 mg total) by mouth 3 (three) times daily.  ? melatonin 5 MG TABS Take 5 mg by mouth at bedtime.  ? methimazole (TAPAZOLE) 10 MG tablet Take 2 tablets (20 mg total) by mouth 2 (two) times daily.  ? Multiple Vitamin (MULTIVITAMIN) tablet Take 1 tablet by mouth daily.  ? NON FORMULARY Diet: Regular  ? polyethylene glycol (MIRALAX / GLYCOLAX) 17 g packet Take 17 g by mouth daily.  ? potassium chloride SA (KLOR-CON M) 20 MEQ tablet Take 20 mEq by mouth 2 (two) times daily.  ? pravastatin (PRAVACHOL) 20 MG tablet Take 1 tablet (20 mg total) by mouth every evening.  ? tamsulosin (FLOMAX) 0.4 MG CAPS capsule Take 0.4 mg by mouth every morning.  ? vitamin B-12 (CYANOCOBALAMIN) 500 MCG tablet Take 1 tablet (500 mcg total) by mouth daily.  ? [DISCONTINUED] methocarbamol (ROBAXIN) 500 MG tablet Take 500 mg by mouth every 6 (six) hours.  ? ?No facility-administered encounter medications on file as of 05/24/2021.  ? ? ? ?SIGNIFICANT DIAGNOSTIC EXAMS ? ?PREVIOUS  ? ?05-06-21: chest x-ray: No active disease.  ? ?05-07-21: pelvic ultrasound:  ?Foley catheter appears to be in the central low pelvis in the region of the decompressed bladder. For definitive localization a  pelvic CT scan is suggested. ? ?05-08-21: ct of abdomen and pelvis ?1. Focal soft tissue of the left lateral bladder wall, concerning for bladder neoplasm. Recommend cystoscopy for further evaluation. ?2. Mildly enlarged left pelvic lymph nodes, new when compared with prior exam. ?3. New indeterminate right adrenal gland nodule. Recommend adrenal protocol CT or MRI for further evaluation. ?4. Foley catheter balloon is inflated within the prostate. Recommend repositioning. ?5. Aortic Atherosclerosis  and Emphysema . ? ?NO NEW EXAMS.  ? ?LABS REVIEWED; PREVIOUS  ? ?05-06-21: wbc 8.1; hgb 9.4; hct 29.2 mcv 92.1 plt  215; glucose 100 bun 28; creat 1.69; k+ 4.3; na++ 138; ca 8.3; GFR 40; alk phos 198; protein 6.7 albumin 2.9 urine culture: multiple species ?05-07-21: free t3: 1.8; free t4: 1.24; vitamin B 12: 227; B 1: 104.4 ?05-08-21: urine culture: 10,000 enterococcus faecalis ?05-09-21: wbc 7.0; hgb 8.4; hct 25.7; mcv 92.4 plt 183; glucose 117; bun 20; creat 1.39; k+ 3.4; na++ 137; ca 7.7; gfr 50; mag 2.2 ? ?NO NEW LABS.  ? ?Review of Systems  ?Constitutional:  Negative for malaise/fatigue.  ?Respiratory:  Negative for cough and shortness of breath.   ?Cardiovascular:  Negative for chest pain, palpitations and leg swelling.  ?Gastrointestinal:  Negative for abdominal pain, constipation and heartburn.  ?Musculoskeletal:  Negative for back pain, joint pain and myalgias.  ?Skin: Negative.   ?Neurological:  Negative for dizziness.  ?Psychiatric/Behavioral:  The patient is not nervous/anxious.   ? ? ?Physical Exam ?Constitutional:   ?   General: He is not in acute distress. ?   Appearance: He is well-developed. He is morbidly obese. He is not diaphoretic.  ?Neck:  ?   Thyroid: No thyromegaly.  ?Cardiovascular:  ?   Rate and Rhythm: Normal rate and regular rhythm.  ?   Pulses: Normal pulses.  ?   Heart sounds: Normal heart sounds.  ?Pulmonary:  ?   Effort: Pulmonary effort is normal. No respiratory distress.  ?   Breath sounds:  Normal breath sounds.  ?Abdominal:  ?   General: Bowel sounds are normal. There is no distension.  ?   Palpations: Abdomen is soft.  ?   Tenderness: There is no abdominal tenderness.  ?Genitourinary: ?   Comments: foley

## 2021-05-28 ENCOUNTER — Encounter: Payer: Self-pay | Admitting: Adult Health

## 2021-05-28 ENCOUNTER — Non-Acute Institutional Stay (SKILLED_NURSING_FACILITY): Payer: PPO | Admitting: Adult Health

## 2021-05-28 ENCOUNTER — Other Ambulatory Visit: Payer: Self-pay | Admitting: Adult Health

## 2021-05-28 DIAGNOSIS — I480 Paroxysmal atrial fibrillation: Secondary | ICD-10-CM | POA: Diagnosis not present

## 2021-05-28 DIAGNOSIS — N1832 Chronic kidney disease, stage 3b: Secondary | ICD-10-CM | POA: Diagnosis not present

## 2021-05-28 DIAGNOSIS — I700 Atherosclerosis of aorta: Secondary | ICD-10-CM

## 2021-05-28 MED ORDER — METHIMAZOLE 10 MG PO TABS: 20.0000 mg | ORAL_TABLET | Freq: Two times a day (BID) | ORAL | 0 refills | Status: AC

## 2021-05-28 MED ORDER — FUROSEMIDE 40 MG PO TABS: 40.0000 mg | ORAL_TABLET | Freq: Every day | ORAL | 0 refills | Status: DC

## 2021-05-28 MED ORDER — POTASSIUM CHLORIDE CRYS ER 20 MEQ PO TBCR: 20.0000 meq | EXTENDED_RELEASE_TABLET | Freq: Two times a day (BID) | ORAL | 0 refills | Status: AC

## 2021-05-28 MED ORDER — GABAPENTIN 100 MG PO CAPS: 100.0000 mg | ORAL_CAPSULE | Freq: Every day | ORAL | 0 refills | Status: AC

## 2021-05-28 MED ORDER — PRAVASTATIN SODIUM 20 MG PO TABS: 20.0000 mg | ORAL_TABLET | Freq: Every evening | ORAL | 0 refills | Status: DC

## 2021-05-28 MED ORDER — CARVEDILOL 6.25 MG PO TABS: 9.3750 mg | ORAL_TABLET | Freq: Two times a day (BID) | ORAL | 0 refills | Status: AC

## 2021-05-28 MED ORDER — FOLIC ACID 1 MG PO TABS: 1.0000 mg | ORAL_TABLET | Freq: Every day | ORAL | 0 refills | Status: DC

## 2021-05-28 MED ORDER — HYDRALAZINE HCL 50 MG PO TABS: 50.0000 mg | ORAL_TABLET | Freq: Three times a day (TID) | ORAL | 0 refills | Status: AC

## 2021-05-28 MED ORDER — FINASTERIDE 5 MG PO TABS: 5.0000 mg | ORAL_TABLET | Freq: Every day | ORAL | 0 refills | Status: AC

## 2021-05-28 MED ORDER — TAMSULOSIN HCL 0.4 MG PO CAPS: 0.4000 mg | ORAL_CAPSULE | Freq: Every morning | ORAL | 0 refills | Status: AC

## 2021-05-28 NOTE — Progress Notes (Signed)
? ?Location:  [place] ?Nursing Home Room Number: 160 P ?Place of Service:  SNF (31) ? ?Provider: Ok Edwards np  ? ?PCP: Celene Squibb, MD ?Patient Care Team: ?Celene Squibb, MD as PCP - General (Internal Medicine) ?Arnoldo Lenis, MD as PCP - Cardiology (Cardiology) ?Rourk, Cristopher Estimable, MD as Consulting Physician (Gastroenterology) ? ?Extended Emergency Contact Information ?Primary Emergency Contact: Benjamin Stain ?Mobile Phone: [phone] ?Relation: Daughter ?Secondary Emergency Contact: Goetsch,Jay ?Mobile Phone: [phone] ?Relation: Son ? ?Code Status: full code  ?Goals of care:  Advanced Directive information ? ?  05/28/2021  ?  9:08 AM  ?Advanced Directives  ?Does Patient Have a Medical Advance Directive? Yes  ?Type of Advance Directive Out of facility DNR (pink MOST or yellow form)   ?Does patient want to make changes to medical advance directive? No - Patient declined  ?  ? Significant value  ? ? ? ?Allergies  ?Allergen Reactions  ? Indomethacin Anaphylaxis  ?  But tolerates ibuprofen, aleve  ? Iodine-131 Anaphylaxis  ? Lidocaine-Menthol   ?  Other reaction(s): Bradycardia  ? ? ?Chief Complaint  ?Patient presents with  ? Discharge Note  ?  Discharge from [facility]   ? ? ?HPI:  ?86 y.o. male  being discharged to home with home health for pt/ot. He will not need any dme. He will need to follow up with his medical provider and will need his prescriptions written. He had been hospitalized for progressive increased weakness to the point where he could not ambulate. He was admitted to this facility for short term rehab. He has participated in pt/ot to improve upon his level of independence with his adls. He ready to complete therapy on a home health basis.  ? ? ? ?Past Medical History:  ?Diagnosis Date  ? AKI (acute kidney injury) ([place]) 02/12/2019  ? CAD (coronary artery disease)   ? a. cath 01/27/2009 : s/p promus DES to LAD and medical managment of 70-80% mRCA  ? Chronic kidney disease,  stage 3b ([place])   ? COVID-19   ? Essential hypertension   ? GI bleeding   ? Hematuria   ? Hyperthyroidism   ? Hypothyroidism   ? Mild atherosclerosis of carotid artery   ? Mild pulmonary hypertension ([place])   ? Mixed hyperlipidemia   ? NSTEMI (non-ST elevated myocardial infarction) [facility]) 2011  ? PAF (paroxysmal atrial fibrillation) ([place])   ? ? ?Past Surgical History:  ?Procedure Laterality Date  ? BOWEL RESECTION N/A 06/28/2019  ? Procedure: SMALL BOWEL RESECTION;  Surgeon: Virl Cagey, MD;  Location: [facility];  Service: General;  Laterality: N/A;  ? CHOLECYSTECTOMY    ? CORONARY STENT PLACEMENT    ? CYSTOSCOPY/RETROGRADE/URETEROSCOPY Bilateral 03/09/2019  ? Procedure: CYSTOSCOPY, Clot Evacuation, Fulguration;  Surgeon: Festus Aloe, MD;  Location: [place];  Service: Urology;  Laterality: Bilateral;  ? HERNIA REPAIR    ? LYSIS OF ADHESION N/A 06/28/2019  ? Procedure: LYSIS OF ADHESION;  Surgeon: Virl Cagey, MD;  Location: [facility];  Service: General;  Laterality: N/A;  ? VENTRAL HERNIA REPAIR N/A 06/28/2019  ? Procedure: HERNIA REPAIR VENTRAL ADULT WITH VICRYL MESH OVERLAY;  Surgeon: Virl Cagey, MD;  Location: [facility];  Service: General;  Laterality: N/A;  ? ? ?  reports that he quit smoking about 64 years ago. His smoking use included cigarettes. He has never used smokeless tobacco. He reports that he does not drink alcohol and does not use drugs. ?Social  History  ? ?Socioeconomic History  ? Marital status: Married  ?  Spouse name: Not on file  ? Number of children: Not on file  ? Years of education: Not on file  ? Highest education level: Not on file  ?Occupational History  ? Occupation: Charity fundraiser  ?Tobacco Use  ? Smoking status: Former  ?  Types: Cigarettes  ?  Quit date: 01/20/1957  ?  Years since quitting: 64.3  ? Smokeless tobacco: Never  ?Vaping Use  ? Vaping Use: Never used  ?Substance and Sexual Activity  ? Alcohol use: No  ? Drug use: Never  ? Sexual activity: Not on file  ?Other Topics Concern  ?  Not on file  ?Social History Narrative  ? Quit smoking about 62 years ago.  ? ?Social Determinants of Health  ? ?Financial Resource Strain: Not on file  ?Food Insecurity: Not on file  ?Transportation Needs: Not on file  ?Physical Activity: Not on file  ?Stress: Not on file  ?Social Connections: Not on file  ?Intimate Partner Violence: Not on file  ? ?Functional Status Survey: ?  ? ?Allergies  ?Allergen Reactions  ? Indomethacin Anaphylaxis  ?  But tolerates ibuprofen, aleve  ? Iodine-131 Anaphylaxis  ? Lidocaine-Menthol   ?  Other reaction(s): Bradycardia  ? ? ?Pertinent  Health Maintenance Due  ?Topic Date Due  ? INFLUENZA VACCINE  08/20/2021  ? ? ?Medications: ?Outpatient Encounter Medications as of 05/28/2021  ?Medication Sig  ? acetaminophen (TYLENOL) 325 MG tablet Take 325 mg by mouth every 6 (six) hours as needed for mild pain.  ? Amino Acids-Protein Hydrolys (FEEDING SUPPLEMENT, PRO-STAT SUGAR FREE 64,) LIQD Take 30 mLs by mouth 3 (three) times daily with meals.  ? aspirin EC 81 MG tablet Take 81 mg by mouth daily. Swallow whole.  ? Balsam [place]-Castor Oil (VENELEX) OINT Apply topical to sacrum and bilateral buttocks three times daily.  ? Coenzyme Q10 200 MG capsule Take 200 mg by mouth daily.  ? Dextromethorphan-guaiFENesin (GERI-TUSSIN DM) 10-100 MG/5ML liquid Take 15 mLs by mouth every 6 (six) hours as needed.  ? melatonin 5 MG TABS Take 5 mg by mouth at bedtime.  ? Multiple Vitamin (MULTIVITAMIN) tablet Take 1 tablet by mouth daily.  ? NON FORMULARY Diet: Regular  ? polyethylene glycol (MIRALAX / GLYCOLAX) 17 g packet Take 17 g by mouth daily.  ? vitamin B-12 (CYANOCOBALAMIN) 500 MCG tablet Take 1 tablet (500 mcg total) by mouth daily.  ?  carvedilol (COREG) 6.25 MG tablet Take 1.5 tablets (9.375 mg total) by mouth 2 (two) times daily.  ?  finasteride (PROSCAR) 5 MG tablet Take 1 tablet (5 mg total) by mouth daily.  ? folic acid (FOLVITE) 1 MG tablet Take 1 tablet (1 mg total) by mouth daily.  ? furosemide  (LASIX) 40 MG tablet Take 40 mg by mouth daily.  ? gabapentin (NEURONTIN) 100 MG capsule Take 100 mg by mouth daily.  ?  hydrALAZINE (APRESOLINE) 50 MG tablet Take 1 tablet (50 mg total) by mouth 3 (three) times daily.  ? methimazole (TAPAZOLE) 10 MG tablet Take 2 tablets (20 mg total) by mouth 2 (two) times daily.  ? potassium chloride SA (KLOR-CON M) 20 MEQ tablet Take 20 mEq by mouth 2 (two) times daily.  ?  pravastatin (PRAVACHOL) 20 MG tablet Take 1 tablet (20 mg total) by mouth every evening.  ?  tamsulosin (FLOMAX) 0.4 MG CAPS capsule Take 0.4 mg by mouth every morning.  ? ?No facility-administered  encounter medications on file as of 05/28/2021.  ? ? ?Vitals:  ? 05/28/21 0900  ?BP: (!) 158/65  ?Pulse: 88  ?Resp: 20  ?Temp: 98.2 ?F (36.8 ?C)  ?SpO2: 96%  ?Weight: (!) 306 lb 8 oz (139 kg)  ?Height: 6' (1.829 m)  ? ?Body mass index is 41.57 kg/m?. ? ? ?SIGNIFICANT DIAGNOSTIC EXAMS ? ?PREVIOUS  ? ?05-06-21: chest x-ray: No active disease.  ? ?05-07-21: pelvic ultrasound:  ?Foley catheter appears to be in the central low pelvis in the region of the decompressed bladder. For definitive localization a pelvic CT scan is suggested. ? ?05-08-21: ct of abdomen and pelvis ?1. Focal soft tissue of the left lateral bladder wall, concerning for bladder neoplasm. Recommend cystoscopy for further evaluation. ?2. Mildly enlarged left pelvic lymph nodes, new when compared with prior exam. ?3. New indeterminate right adrenal gland nodule. Recommend adrenal protocol CT or MRI for further evaluation. ?4. Foley catheter balloon is inflated within the prostate. Recommend repositioning. ?5. Aortic Atherosclerosis  and Emphysema . ? ?NO NEW EXAMS.  ? ?LABS REVIEWED; PREVIOUS  ? ?05-06-21: wbc 8.1; hgb 9.4; hct 29.2 mcv 92.1 plt 215; glucose 100 bun 28; creat 1.69; k+ 4.3; na++ 138; ca 8.3; GFR 40; alk phos 198; protein 6.7 albumin 2.9 urine culture: multiple species ?05-07-21: free t3: 1.8; free t4: 1.24; vitamin B 12: 227; B 1:  104.4 ?05-08-21: urine culture: 10,000 enterococcus faecalis ?05-09-21: wbc 7.0; hgb 8.4; hct 25.7; mcv 92.4 plt 183; glucose 117; bun 20; creat 1.39; k+ 3.4; na++ 137; ca 7.7; gfr 50; mag 2.2 ? ?NO NEW LABS.  ? ?Rev

## 2021-05-29 ENCOUNTER — Emergency Department (HOSPITAL_COMMUNITY): Payer: Medicare Other

## 2021-05-29 ENCOUNTER — Other Ambulatory Visit: Payer: Self-pay

## 2021-05-29 ENCOUNTER — Encounter (HOSPITAL_COMMUNITY): Payer: Self-pay

## 2021-05-29 ENCOUNTER — Emergency Department (HOSPITAL_COMMUNITY)
Admission: EM | Admit: 2021-05-29 | Discharge: 2021-05-30 | Disposition: A | Discharge status: Skilled Nursing Facility | Payer: Medicare Other | Attending: Emergency Medicine | Admitting: Emergency Medicine

## 2021-05-29 DIAGNOSIS — S0990XA Unspecified injury of head, initial encounter: Secondary | ICD-10-CM | POA: Diagnosis not present

## 2021-05-29 DIAGNOSIS — Z7982 Long term (current) use of aspirin: Secondary | ICD-10-CM | POA: Insufficient documentation

## 2021-05-29 DIAGNOSIS — S42202A Unspecified fracture of upper end of left humerus, initial encounter for closed fracture: Secondary | ICD-10-CM | POA: Diagnosis not present

## 2021-05-29 DIAGNOSIS — S42332A Displaced oblique fracture of shaft of humerus, left arm, initial encounter for closed fracture: Secondary | ICD-10-CM | POA: Diagnosis not present

## 2021-05-29 DIAGNOSIS — M25512 Pain in left shoulder: Secondary | ICD-10-CM | POA: Diagnosis not present

## 2021-05-29 DIAGNOSIS — R2681 Unsteadiness on feet: Secondary | ICD-10-CM | POA: Insufficient documentation

## 2021-05-29 DIAGNOSIS — R531 Weakness: Secondary | ICD-10-CM | POA: Diagnosis not present

## 2021-05-29 DIAGNOSIS — M79602 Pain in left arm: Secondary | ICD-10-CM | POA: Diagnosis not present

## 2021-05-29 DIAGNOSIS — W19XXXA Unspecified fall, initial encounter: Secondary | ICD-10-CM | POA: Insufficient documentation

## 2021-05-29 DIAGNOSIS — S4992XA Unspecified injury of left shoulder and upper arm, initial encounter: Secondary | ICD-10-CM | POA: Diagnosis present

## 2021-05-29 DIAGNOSIS — M6281 Muscle weakness (generalized): Secondary | ICD-10-CM | POA: Insufficient documentation

## 2021-05-29 LAB — CBC WITH DIFFERENTIAL/PLATELET
Band Neutrophils: 1 %
Basophils Absolute: 0 10*3/uL (ref 0.0–0.1)
Basophils Relative: 0 %
Eosinophils Absolute: 0.1 10*3/uL (ref 0.0–0.5)
Eosinophils Relative: 1 %
HCT: 26.6 % — ABNORMAL LOW (ref 39.0–52.0)
Hemoglobin: 8.4 g/dL — ABNORMAL LOW (ref 13.0–17.0)
Lymphocytes Relative: 7 %
Lymphs Abs: 0.6 10*3/uL — ABNORMAL LOW (ref 0.7–4.0)
MCH: 28.6 pg (ref 26.0–34.0)
MCHC: 31.6 g/dL (ref 30.0–36.0)
MCV: 90.5 fL (ref 80.0–100.0)
Metamyelocytes Relative: 4 %
Monocytes Absolute: 1.1 10*3/uL — ABNORMAL HIGH (ref 0.1–1.0)
Monocytes Relative: 12 %
Myelocytes: 2 %
Neutro Abs: 6.4 10*3/uL (ref 1.7–7.7)
Neutrophils Relative %: 72 %
Platelets: 211 10*3/uL (ref 150–400)
Promyelocytes Relative: 1 %
RBC: 2.94 MIL/uL — ABNORMAL LOW (ref 4.22–5.81)
RDW: 14.8 % (ref 11.5–15.5)
WBC: 8.8 10*3/uL (ref 4.0–10.5)
nRBC: 0 % (ref 0.0–0.2)

## 2021-05-29 LAB — BASIC METABOLIC PANEL
Anion gap: 9 (ref 5–15)
BUN: 30 mg/dL — ABNORMAL HIGH (ref 8–23)
CO2: 23 mmol/L (ref 22–32)
Calcium: 7.7 mg/dL — ABNORMAL LOW (ref 8.9–10.3)
Chloride: 105 mmol/L (ref 98–111)
Creatinine, Ser: 1.48 mg/dL — ABNORMAL HIGH (ref 0.61–1.24)
GFR, Estimated: 46 mL/min — ABNORMAL LOW (ref >60)
Glucose, Bld: 128 mg/dL — ABNORMAL HIGH (ref 70–99)
Potassium: 4.2 mmol/L (ref 3.5–5.1)
Sodium: 137 mmol/L (ref 135–145)

## 2021-05-29 MED ORDER — HYDRALAZINE HCL 25 MG PO TABS
50.0000 mg | ORAL_TABLET | Freq: Three times a day (TID) | ORAL | Status: DC
Start: 2021-05-29 — End: 2021-05-30
  Administered 2021-05-29 – 2021-05-30 (×3): 50 mg via ORAL
  Filled 2021-05-29 (×3): qty 2

## 2021-05-29 MED ORDER — ACETAMINOPHEN 325 MG PO TABS
650.0000 mg | ORAL_TABLET | Freq: Four times a day (QID) | ORAL | Status: DC | PRN
  Administered 2021-05-30: 650 mg via ORAL
  Filled 2021-05-29: qty 2

## 2021-05-29 MED ORDER — ACETAMINOPHEN 325 MG PO TABS
650.0000 mg | ORAL_TABLET | Freq: Once | ORAL | Status: DC
  Filled 2021-05-29: qty 2

## 2021-05-29 MED ORDER — TRAMADOL HCL 50 MG PO TABS
25.0000 mg | ORAL_TABLET | Freq: Once | ORAL | Status: AC
  Administered 2021-05-29: 25 mg via ORAL
  Filled 2021-05-29: qty 1

## 2021-05-29 MED ORDER — ACETAMINOPHEN 325 MG PO TABS
650.0000 mg | ORAL_TABLET | Freq: Once | ORAL | Status: AC
  Administered 2021-05-29: 650 mg via ORAL
  Filled 2021-05-29: qty 2

## 2021-05-29 NOTE — ED Triage Notes (Addendum)
Patient presents to ED via [facility], pt fell at home witnessed  by son. Denies hitting his head. C/o pain in left shoulder and arm. Patient reports he was just released from [facility] today and fell when he arrived home. ?

## 2021-05-29 NOTE — ED Provider Notes (Signed)
?[facility] ?Provider Note ? ? ?CSN: [id number] ?Arrival date & time: 05/29/21  1412 ? ?  ? ?History ? ?Chief Complaint  ?Patient presents with  ? Fall  ? ? ?Derek Foster is a 86 y.o. male. ? ?Patient presents ER chief complaint of left upper extremity pain.  He states that he was transferring from his wheelchair when he lost his balance and fell onto his left shoulder.  He is not sure if he hit his head but denies loss of consciousness.  Had hard time getting up.  Denies any fevers or cough or vomiting or diarrhea.  Was recently discharged after admission for generalized weakness and short stay at a nursing home. ? ? ?  ? ?Home Medications ?Prior to Admission medications   ?Medication Sig Start Date End Date Taking? Authorizing Provider  ?aspirin EC 81 MG tablet Take 81 mg by mouth daily. Swallow whole.   Yes [provider]  ?Janne [facility]) OINT Apply topical to sacrum and bilateral buttocks three times daily.   Yes [provider]  ?carvedilol (COREG) 6.25 MG tablet Take 1.5 tablets (9.375 mg total) by mouth 2 (two) times daily. 05/28/21  Yes Gerlene Fee, NP  ?Coenzyme Q10 200 MG capsule Take 200 mg by mouth daily.   Yes [provider]  ?finasteride (PROSCAR) 5 MG tablet Take 1 tablet (5 mg total) by mouth daily. 05/28/21  Yes Gerlene Fee, NP  ?folic acid (FOLVITE) 1 MG tablet Take 1 tablet (1 mg total) by mouth daily. 05/28/21  Yes Gerlene Fee, NP  ?furosemide (LASIX) 40 MG tablet Take 1 tablet (40 mg total) by mouth daily. 05/28/21  Yes Gerlene Fee, NP  ?gabapentin (NEURONTIN) 100 MG capsule Take 1 capsule (100 mg total) by mouth daily. 05/28/21  Yes Gerlene Fee, NP  ?hydrALAZINE (APRESOLINE) 50 MG tablet Take 1 tablet (50 mg total) by mouth 3 (three) times daily. 05/28/21  Yes Gerlene Fee, NP  ?melatonin 5 MG TABS Take 5 mg by mouth at bedtime.   Yes [provider]  ?methimazole (TAPAZOLE) 10 MG tablet Take 2 tablets  (20 mg total) by mouth 2 (two) times daily. 05/28/21  Yes Gerlene Fee, NP  ?Multiple Vitamin (MULTIVITAMIN) tablet Take 1 tablet by mouth daily.   Yes [provider]  ?polyethylene glycol (MIRALAX / GLYCOLAX) 17 g packet Take 17 g by mouth daily.   Yes [provider]  ?potassium chloride SA (KLOR-CON M) 20 MEQ tablet Take 1 tablet (20 mEq total) by mouth 2 (two) times daily. 05/28/21  Yes Gerlene Fee, NP  ?pravastatin (PRAVACHOL) 20 MG tablet Take 1 tablet (20 mg total) by mouth every evening. 05/28/21  Yes Gerlene Fee, NP  ?tamsulosin (FLOMAX) 0.4 MG CAPS capsule Take 1 capsule (0.4 mg total) by mouth every morning. 05/28/21  Yes Gerlene Fee, NP  ?vitamin B-12 (CYANOCOBALAMIN) 500 MCG tablet Take 1 tablet (500 mcg total) by mouth daily. 05/09/21  Yes Orson Eva, MD  ?   ? ?Allergies    ?Indomethacin, Iodine-131, and Lidocaine-menthol   ? ?Review of Systems   ?Review of Systems  ?Constitutional:  Negative for fever.  ?HENT:  Negative for ear pain and sore throat.   ?Eyes:  Negative for pain.  ?Respiratory:  Negative for cough.   ?Cardiovascular:  Negative for chest pain.  ?Gastrointestinal:  Negative for abdominal pain.  ?Genitourinary:  Negative for flank pain.  ?Musculoskeletal:  Negative for back pain.  ?Skin:  Negative for color change and rash.  ?Neurological:  Negative for syncope.  ?All other systems reviewed and are negative. ? ?Physical Exam ?Updated Vital Signs ?BP (!) 167/59   Pulse 85   Temp 98.4 ?F (36.9 ?C) (Oral)   Resp 16   Ht 6' (1.829 m)   Wt (!) 139 kg   SpO2 94%   BMI 41.57 kg/m?  ?Physical Exam ?Constitutional:   ?   Appearance: He is well-developed.  ?HENT:  ?   Head: Normocephalic.  ?   Nose: Nose normal.  ?Eyes:  ?   Extraocular Movements: Extraocular movements intact.  ?Cardiovascular:  ?   Rate and Rhythm: Normal rate.  ?Pulmonary:  ?   Effort: Pulmonary effort is normal.  ?Musculoskeletal:  ?   Comments: Decreased range of motion of the left upper  extremity.  Otherwise neurovascularly intact compartments are soft.  Tenderness palpation diffusely across the left upper extremity. ? ?No C or T or L-spine tenderness noted.  Ranging bilateral lower extremities hips ankles knees without pain or discomfort.  ?Skin: ?   Coloration: Skin is not jaundiced.  ?Neurological:  ?   Mental Status: He is alert. Mental status is at baseline.  ? ? ?ED Results / Procedures / Treatments   ?Labs ?(all labs ordered are listed, but only abnormal results are displayed) ?Labs Reviewed  ?CBC WITH DIFFERENTIAL/PLATELET - Abnormal; Notable for the following components:  ?    Result Value  ? RBC 2.94 (*)   ? Hemoglobin 8.4 (*)   ? HCT 26.6 (*)   ? Lymphs Abs 0.6 (*)   ? Monocytes Absolute 1.1 (*)   ? All other components within normal limits  ?BASIC METABOLIC PANEL - Abnormal; Notable for the following components:  ? Glucose, Bld 128 (*)   ? BUN 30 (*)   ? Creatinine, Ser 1.48 (*)   ? Calcium 7.7 (*)   ? GFR, Estimated 46 (*)   ? All other components within normal limits  ? ? ?EKG ?None ? ?Radiology ?DG Elbow Complete Left ? ?Result Date: 05/29/2021 ?CLINICAL DATA:  Fall onto left arm with left arm pain. Limited range of motion. EXAM: LEFT ELBOW - COMPLETE 3+ VIEW COMPARISON:  None Available. FINDINGS: Technically limited due to difficulty with positioning due to pain. Allowing for this, no acute fracture. No dislocation. Assessment for joint effusion is limited due to positioning. Soft tissue edema posteriorly. IMPRESSION: Technically limited due to difficulty with positioning due to pain. Allowing for this, no acute fracture or dislocation. Electronically Signed   By: Keith Rake M.D.   On: 05/29/2021 16:30  ? ?DG Forearm Left ? ?Result Date: 05/29/2021 ?CLINICAL DATA:  Fall onto left arm with left arm pain. Limited range of motion. EXAM: LEFT FOREARM - 2 VIEW COMPARISON:  Elbow radiograph performed concurrently. FINDINGS: The cortical margins of the radius and ulna are intact. There  is no evidence of fracture or other focal bone lesions. Wrist alignment is maintained. There is posterior soft tissue edema. IMPRESSION: No fracture or subluxation of the left forearm. Electronically Signed   By: Keith Rake M.D.   On: 05/29/2021 16:28  ? ?CT Head Wo Contrast ? ?Result Date: 05/29/2021 ?CLINICAL DATA:  Head trauma EXAM: CT HEAD WITHOUT CONTRAST TECHNIQUE: Contiguous axial images were obtained from the base of the skull through the vertex without intravenous contrast. RADIATION DOSE REDUCTION: This exam was performed according to the departmental dose-optimization program which includes  automated exposure control, adjustment of the mA and/or kV according to patient size and/or use of iterative reconstruction technique. COMPARISON:  Head CT dated May 19, 2019 FINDINGS: Brain: Chronic white matter ischemic change. No evidence of acute infarction, hemorrhage, hydrocephalus, extra-axial collection or mass lesion/mass effect. Vascular: No hyperdense vessel or unexpected calcification. Skull: Normal. Negative for fracture or focal lesion. Sinuses/Orbits: No acute finding. Other: None. IMPRESSION: No acute intracranial abnormality. Electronically Signed   By: Yetta Glassman M.D.   On: 05/29/2021 15:59  ? ?DG Shoulder Left ? ?Result Date: 05/29/2021 ?CLINICAL DATA:  Fell onto LEFT arm, LEFT arm and shoulder pain EXAM: LEFT SHOULDER - 2+ VIEW COMPARISON:  None FINDINGS: Osseous demineralization. AC joint alignment normal. Displaced oblique fracture proximal LEFT humeral diaphysis. No dislocation. Visualized ribs intact. IMPRESSION: Displaced oblique fracture proximal LEFT humeral diaphysis. Electronically Signed   By: Lavonia Dana M.D.   On: 05/29/2021 16:26   ? ?Procedures ?Procedures  ? ? ?Medications Ordered in ED ?Medications  ?acetaminophen (TYLENOL) tablet 650 mg (650 mg Oral Not Given 05/29/21 1639)  ?hydrALAZINE (APRESOLINE) tablet 50 mg (has no administration in time range)  ?acetaminophen  (TYLENOL) tablet 650 mg (has no administration in time range)  ?acetaminophen (TYLENOL) tablet 650 mg (650 mg Oral Given 05/29/21 1638)  ?traMADol (ULTRAM) tablet 25 mg (25 mg Oral Given 05/29/21 2132)  ? ? ?ED Cours

## 2021-05-30 ENCOUNTER — Telehealth: Payer: Self-pay | Admitting: Radiology

## 2021-05-30 ENCOUNTER — Other Ambulatory Visit: Payer: Medicare Other | Admitting: Urology

## 2021-05-30 MED ORDER — FINASTERIDE 5 MG PO TABS
5.0000 mg | ORAL_TABLET | Freq: Every day | ORAL | Status: DC
  Administered 2021-05-30: 5 mg via ORAL
  Filled 2021-05-30: qty 1

## 2021-05-30 MED ORDER — METHIMAZOLE 5 MG PO TABS
20.0000 mg | ORAL_TABLET | Freq: Two times a day (BID) | ORAL | Status: DC
Start: 2021-05-30 — End: 2021-05-30
  Administered 2021-05-30: 20 mg via ORAL
  Filled 2021-05-30 (×3): qty 2

## 2021-05-30 MED ORDER — HYDROCODONE-ACETAMINOPHEN 5-325 MG PO TABS
1.0000 | ORAL_TABLET | Freq: Four times a day (QID) | ORAL | Status: DC | PRN
Start: 2021-05-30 — End: 2021-05-30
  Administered 2021-05-30: 1 via ORAL
  Filled 2021-05-30: qty 1

## 2021-05-30 MED ORDER — HYDRALAZINE HCL 25 MG PO TABS: 50.0000 mg | ORAL_TABLET | Freq: Three times a day (TID) | ORAL | Status: DC

## 2021-05-30 MED ORDER — POTASSIUM CHLORIDE CRYS ER 20 MEQ PO TBCR
20.0000 meq | EXTENDED_RELEASE_TABLET | Freq: Two times a day (BID) | ORAL | Status: DC
  Administered 2021-05-30: 20 meq via ORAL
  Filled 2021-05-30: qty 1

## 2021-05-30 MED ORDER — PRAVASTATIN SODIUM 10 MG PO TABS: 20.0000 mg | ORAL_TABLET | Freq: Every evening | ORAL | Status: DC

## 2021-05-30 MED ORDER — TAMSULOSIN HCL 0.4 MG PO CAPS
0.4000 mg | ORAL_CAPSULE | Freq: Every morning | ORAL | Status: DC
  Administered 2021-05-30: 0.4 mg via ORAL
  Filled 2021-05-30: qty 1

## 2021-05-30 MED ORDER — FUROSEMIDE 40 MG PO TABS
40.0000 mg | ORAL_TABLET | Freq: Every day | ORAL | Status: DC
  Administered 2021-05-30: 40 mg via ORAL
  Filled 2021-05-30: qty 1

## 2021-05-30 MED ORDER — GABAPENTIN 100 MG PO CAPS
100.0000 mg | ORAL_CAPSULE | Freq: Every day | ORAL | Status: DC
  Administered 2021-05-30: 100 mg via ORAL
  Filled 2021-05-30: qty 1

## 2021-05-30 MED ORDER — CARVEDILOL 3.125 MG PO TABS
9.3750 mg | ORAL_TABLET | Freq: Two times a day (BID) | ORAL | Status: DC
  Administered 2021-05-30: 9.375 mg via ORAL
  Filled 2021-05-30: qty 3

## 2021-05-30 MED ORDER — ASPIRIN EC 81 MG PO TBEC
81.0000 mg | DELAYED_RELEASE_TABLET | Freq: Every day | ORAL | Status: DC
  Administered 2021-05-30: 81 mg via ORAL
  Filled 2021-05-30: qty 1

## 2021-05-30 NOTE — ED Notes (Signed)
Meal tray given to pt. Nurse notified ?

## 2021-05-30 NOTE — ED Notes (Signed)
Pt had incontinence of bowel; pt cleaned and new linens placed under pt; pt given cup of water ? ?

## 2021-05-30 NOTE — ED Notes (Signed)
Pt had small bowel movement. Pt has had peri care and barrier cream applied. New sheets, incont. Pad, and brief placed. ?

## 2021-05-30 NOTE — Evaluation (Signed)
Physical Therapy Evaluation ?Patient Details ?Name: Derek Foster ?MRN: [id number] ?DOB: [birth year] ?Today's Date: 05/30/2021 ? ?History of Present Illness ? Derek Foster is a 86 y.o. male with medical history significant of with history of coronary artery disease, essential hypertension, hypothyroidism mixed hyperlipidemia, NSTEMI, paroxysmal atrial fibrillation, and more presents to ED with a chief complaint of weakness.  Patient reports he is so weak that he could not stand up.  Home PT came today and put a belt on him and tried to stand him up but his legs were not able to hold him up.  There is no pain in the legs.  There is no numbness or tingling.  There is no asymmetric weakness.  Patient reports that he has a decrease in appetite but he has not been feeling good.  He cannot explain what he means when he says he has not been feeling good.  He denies nausea, vomiting, stomach pain, diarrhea.  He does admit to 3 days of constipation but did have a bowel movement while in the ED.  Patient denies back pain, bladder pain.  He has an indwelling Foley.  His urine in the Foley bag is brown, he thinks that that may have started yesterday, but he is not sure.  Patient has no other complaints ?  ?Clinical Impression ? Patient demonstrates need for max assist with bed mobility due to limitations of generalized weakness and pain patient reports with L humerus after recent fx that occurs during movement. Patient needs max assist and assist of +2 PT's to perform sit to stand transfer due to limitations of poor balance, generalized weakness and pain limitations of L UE from recent L humerus fx. Patient will benefit from continued skilled physical therapy in hospital and recommended venue below to increase strength, balance, endurance for safe ADLs and gait.   ?   ? ?Recommendations for follow up therapy are one component of a multi-disciplinary discharge planning process, led by the attending physician.  Recommendations may be  updated based on patient status, additional functional criteria and insurance authorization. ? ?Follow Up Recommendations Skilled nursing-short term rehab (<3 hours/day) ? ?  ?Assistance Recommended at Discharge Intermittent Supervision/Assistance  ?Patient can return home with the following ? A lot of help with walking and/or transfers;A little help with bathing/dressing/bathroom;Assistance with cooking/housework;Help with stairs or ramp for entrance ? ?  ?Equipment Recommendations None recommended by PT  ?Recommendations for Other Services ?    ?  ?Functional Status Assessment Patient has had a recent decline in their functional status and demonstrates the ability to make significant improvements in function in a reasonable and predictable amount of time.  ? ?  ?Precautions / Restrictions Precautions ?Precautions: Fall ?Required Braces or Orthoses: Sling ?Restrictions ?Weight Bearing Restrictions: No  ? ?  ? ?Mobility ? Bed Mobility ?Overal bed mobility: Needs Assistance ?Bed Mobility: Supine to Sit, Sit to Supine ?  ?  ?Supine to sit: Max assist ?Sit to supine: Max assist ?  ?General bed mobility comments: Patient demonstrates need for max assist with bed mobility due to generalized weakness of UE/LE, poor core strength and limitations of L arm due to fx. Patient demonstrates slow labored movement in which activation of movement is required by PT for max assist ?  ? ?Transfers ?Overall transfer level: Needs assistance ?Equipment used: None ?Transfers: Sit to/from Stand ?Sit to Stand: Max assist, +2 physical assistance ?  ?  ?  ?  ?  ?General transfer comment: Patient has high  difficulty with transfer due to recent left humerus fx and UE placed in sling impacting patient's ability to help with push off or attempting to utilize RW to perform sit to stand transfer. Patient needed max assist and assist of 2 PT's with gait belt to perform sit to stand but needed moderate cueing and motivation to do so. ?   ? ?Ambulation/Gait ?  ?  ?  ?  ?  ?  ?  ?  ? ?Stairs ?  ?  ?  ?  ?  ? ?Wheelchair Mobility ?  ? ?Modified Rankin (Stroke Patients Only) ?  ? ?  ? ?Balance Overall balance assessment: Needs assistance ?Sitting-balance support: Feet supported, Single extremity supported ?  ?Sitting balance - Comments: Patient able to sit at EOB with feet on ground after max assist with bed mobility. Patient does demonstrate right lateral lean while sitting and is not able to correct demonstrating poor trunk control. ?  ?Standing balance support: During functional activity, No upper extremity supported ?Standing balance-Leahy Scale: Poor ?Standing balance comment: Patient needed max assist and help of 2 PT's with sit to stand transfer and could not maintain position due to poor balance and inability to lock knees out and stand with proper posture. ?  ?  ?  ?  ?  ?  ?  ?  ?  ?  ?  ?   ? ? ? ?Pertinent Vitals/Pain Pain Assessment ?Pain Assessment: Faces ?Faces Pain Scale: Hurts even more ?Pain Location: L arm ?Pain Descriptors / Indicators: Aching, Discomfort, Grimacing ?Pain Intervention(s): Limited activity within patient's tolerance, Monitored during session  ? ? ?Home Living Family/patient expects to be discharged to:: Private residence ?Living Arrangements: Children ?Available Help at Discharge: Available PRN/intermittently ?Type of Home: House ?Home Access: Ramped entrance ?  ?  ?  ?Home Layout: Two level;Other (Comment);Able to live on main level with bedroom/bathroom ?Home Equipment: Rollator (4 wheels);BSC/3in1;Grab bars - toilet;Grab bars - tub/shower;Wheelchair - manual;Other (comment) ?Additional Comments: Pt reports assist from aide in last month.  ?  ?Prior Function Prior Level of Function : Needs assist ?  ?  ?  ?Physical Assist : Mobility (physical);ADLs (physical) ?Mobility (physical): Bed mobility;Transfers;Gait;Stairs ?ADLs (physical): Bathing;Dressing;Toileting;IADLs ?Mobility Comments: Pt reports use of rollator at  baseline. Unable to stand with PT leading to hospital admission. Pt mobility has decreased significantly in the past month. Pt used to drive 2 to 3 months ago. ?ADLs Comments: Pt was indepndent for ADL's prior to a month ago. Now pt requries assist from aide for bathing, dressing, and toileting. Indepndent grooming and feeding. Assisted by family for IADL's. ?  ? ? ?Hand Dominance  ? Dominant Hand: Right ? ?  ?Extremity/Trunk Assessment  ? Upper Extremity Assessment ?Upper Extremity Assessment: Generalized weakness;LUE deficits/detail ?LUE Deficits / Details: L humerus fx- patient reports pain and discomfort with movement ?  ? ?Lower Extremity Assessment ?Lower Extremity Assessment: Generalized weakness ?  ? ?Cervical / Trunk Assessment ?Cervical / Trunk Assessment: Normal  ?Communication  ? Communication: No difficulties  ?Cognition Arousal/Alertness: Awake/alert ?Behavior During Therapy: [facility] for tasks assessed/performed ?Overall Cognitive Status: Within Functional Limits for tasks assessed ?  ?  ?  ?  ?  ?  ?  ?  ?  ?  ?  ?  ?  ?  ?  ?  ?  ?  ?  ? ?  ?General Comments   ? ?  ?Exercises    ? ?Assessment/Plan  ?  ?PT Assessment Patient  needs continued PT services;All further PT needs can be met in the next venue of care  ?PT Problem List Decreased strength;Decreased activity tolerance;Decreased balance;Decreased mobility;Decreased coordination ? ?   ?  ?PT Treatment Interventions DME instruction;Gait training;Functional mobility training;Therapeutic activities;Therapeutic exercise;Balance training   ? ?PT Goals (Current goals can be found in the Care Plan section)  ?Acute Rehab PT Goals ?Patient Stated Goal: return home after rehab ?PT Goal Formulation: With patient ?Time For Goal Achievement: 06/13/21 ?Potential to Achieve Goals: Fair ? ?  ?Frequency Min 2X/week ?  ? ? ?Co-evaluation   ?  ?PT goals addressed during session: Mobility/safety with mobility;Balance;Proper use of DME ?  ?  ? ? ?  ?AM-PAC PT "6 Clicks"  Mobility  ?Outcome Measure Help needed turning from your back to your side while in a flat bed without using bedrails?: A Lot ?Help needed moving from lying on your back to sitting on the side of a flat bed withou

## 2021-05-30 NOTE — Plan of Care (Signed)
?  Problem: Acute Rehab PT Goals(only PT should resolve) ?Goal: Pt Will Go Supine/Side To Sit ?Outcome: Progressing ?Flowsheets (Taken 05/30/2021 1102) ?Pt will go Supine/Side to Sit: ? with minimal assist ? with moderate assist ?Goal: Pt Will Go Sit To Supine/Side ?Outcome: Progressing ?Flowsheets (Taken 05/30/2021 1102) ?Pt will go Sit to Supine/Side: ? with minimal assist ? with moderate assist ?Goal: Patient Will Transfer Sit To/From Stand ?Outcome: Progressing ?Flowsheets (Taken 05/30/2021 1102) ?Patient will transfer sit to/from stand: ? with minimal assist ? with moderate assist ?Goal: Pt Will Transfer Bed To Chair/Chair To Bed ?Outcome: Progressing ?Flowsheets (Taken 05/30/2021 1102) ?Pt will Transfer Bed to Chair/Chair to Bed: ? with mod assist ? with min assist ?Goal: Pt Will Perform Standing Balance Or Pre-Gait ?Outcome: Progressing ?Flowsheets (Taken 05/30/2021 1102) ?Pt will perform standing balance or pre-gait: ? with minimal assist ? with moderate assist ?Goal: Pt Will Ambulate ?Outcome: Progressing ?Flowsheets (Taken 05/30/2021 1102) ?Pt will Ambulate: ? 15 feet ? with minimal assist ? with moderate assist ? with rolling walker ? ?11:03 AM, 05/30/21 ?Lestine Box, S/PT  ?  ?

## 2021-05-30 NOTE — Telephone Encounter (Signed)
Janett Billow from [facility] called, asked about scheduling this patient.  Please see ED notes and xrays, how soon does patient need to be seen?  Thanks. ?

## 2021-05-30 NOTE — ED Notes (Signed)
CSW notifited that PT is recommending pt go to SNF. CSW spoke to [place] with [facility] who states that pt can return however he will be in copay days and will need to agree to pay the daily copay.  ? ?CSW met with pt in room about this. Pt states that he understands he is in copay days and agrees to pay this cost.  ? ?TOC has started insurance auth for pt at this time. TOC to follow.  ?

## 2021-05-30 NOTE — [place] FL2 (Signed)
?[place] MEDICAID FL2 LEVEL OF CARE SCREENING TOOL  ?  ? ?IDENTIFICATION  ?Patient Name: ?MALACAI GRANTZ Birthdate: July 06, 1935 Sex: male Admission Date (Current Location): ?05/29/2021  ?[place] and [place] Number: ? [place] and Address:  ?[facility] [address], [place] ?     Provider Number: ?[id number]  ?Attending Physician Name and Address:  ?Default, Provider, MD ? Relative Name and Phone Number:  ?Eldrige, Pitkin (Daughter) [phone] ?   ?Current Level of Care: ?Hospital Recommended Level of Care: ?[facility] Prior Approval Number: ?  ? ?Date Approved/Denied: ?  PASRR Number: ?[id number] A ? ?Discharge Plan: ?SNF ?  ? ?Current Diagnoses: ?Patient Active Problem List  ? Diagnosis Date Noted  ? Lower back pain 05/15/2021  ? Abnormal CT scan, bladder 05/13/2021  ? Stage 3b chronic kidney disease (CKD) ([place]) 05/10/2021  ? Vitamin B12 deficiency 05/10/2021  ? Protein-calorie malnutrition, severe ([place]) 05/10/2021  ? Chronic anemia 05/10/2021  ? Aortic atherosclerosis ([place]) 05/10/2021  ? Bilateral lower extremity edema 05/10/2021  ? Obesity, Class III, BMI 40-49.9 (morbid obesity) ([place]) 05/09/2021  ? Hematuria 05/08/2021  ? Generalized weakness 05/06/2021  ? Hypokalemia 07/10/2019  ? UTI (urinary tract infection) due to urinary indwelling catheter ([place]) 04/11/2019  ? Radiculopathy 04/11/2019  ? PAF (paroxysmal atrial fibrillation) ([place]) 04/11/2019  ? BPH (benign prostatic hyperplasia) 03/28/2019  ? GERD without esophagitis 03/28/2019  ? DNR (do not resuscitate)   ? Acute renal failure superimposed on stage 3b chronic kidney disease ([place]) 02/12/2019  ? CAD S/P percutaneous coronary angioplasty 02/11/2019  ? Mixed hyperlipidemia 09/10/2009  ? Hyperthyroidism 02/26/2009  ? Essential hypertension 02/26/2009  ? ? ?Orientation RESPIRATION BLADDER Height & Weight   ?  ?Self, Time, Situation, Place ? Normal Continent Weight: (!) 306 lb 8 oz (139 kg) ?Height:  6' (182.9  cm)  ?BEHAVIORAL SYMPTOMS/MOOD NEUROLOGICAL BOWEL NUTRITION STATUS  ?    Continent Diet (Heart Healthy)  ?AMBULATORY STATUS COMMUNICATION OF NEEDS Skin   ?Extensive Assist Verbally Normal ?  ?  ?  ?    ?     ?     ? ? ?Personal Care Assistance Level of Assistance  ?Bathing, Feeding, Dressing Bathing Assistance: Limited assistance ?Feeding assistance: Independent ?Dressing Assistance: Limited assistance ?   ? ?Functional Limitations Info  ?Sight, Hearing, Speech Sight Info: Adequate ?Hearing Info: Adequate ?Speech Info: Adequate  ? ? ?SPECIAL CARE FACTORS FREQUENCY  ?PT (By licensed PT), OT (By licensed OT)   ?  ?PT Frequency: 5 times weekly ?OT Frequency: 5 times weekly ?  ?  ?  ?   ? ? ?Contractures Contractures Info: Not present  ? ? ?Additional Factors Info  ?Code Status, Allergies Code Status Info: DNR ?Allergies Info: Indomethacin, Iodine 131, Lidocaine menthol ?  ?  ?  ?   ? ?Current Medications (05/30/2021):  This is the current hospital active medication list ?Current Facility-Administered Medications  ?Medication Dose Route Frequency Provider Last Rate Last Admin  ? acetaminophen (TYLENOL) tablet 650 mg  650 mg Oral Once [place], Greggory Brandy, MD      ? aspirin EC tablet 81 mg  81 mg Oral Daily Fredia Sorrow, MD   81 mg at 05/30/21 0949  ? carvedilol (COREG) tablet 9.375 mg  9.375 mg Oral BID Fredia Sorrow, MD   9.375 mg at 05/30/21 0949  ? finasteride (PROSCAR) tablet 5 mg  5 mg Oral Daily Fredia Sorrow, MD   5 mg at 05/30/21 0950  ?  furosemide (LASIX) tablet 40 mg  40 mg Oral Daily Fredia Sorrow, MD   40 mg at 05/30/21 0949  ? gabapentin (NEURONTIN) capsule 100 mg  100 mg Oral Daily Fredia Sorrow, MD   100 mg at 05/30/21 0949  ? hydrALAZINE (APRESOLINE) tablet 50 mg  50 mg Oral Q8H Luna Fuse, MD   50 mg at 05/30/21 0630  ? HYDROcodone-acetaminophen (NORCO/VICODIN) 5-325 MG per tablet 1 tablet  1 tablet Oral Q6H PRN Fredia Sorrow, MD   1 tablet at 05/30/21 1014  ? methimazole (TAPAZOLE)  tablet 20 mg  20 mg Oral BID Fredia Sorrow, MD   20 mg at 05/30/21 0950  ? potassium chloride SA (KLOR-CON M) CR tablet 20 mEq  20 mEq Oral BID Fredia Sorrow, MD   20 mEq at 05/30/21 0949  ? pravastatin (PRAVACHOL) tablet 20 mg  20 mg Oral QPM Fredia Sorrow, MD      ? tamsulosin (FLOMAX) capsule 0.4 mg  0.4 mg Oral q morning Fredia Sorrow, MD   0.4 mg at 05/30/21 0949  ? ?Current Outpatient Medications  ?Medication Sig Dispense Refill  ? aspirin EC 81 MG tablet Take 81 mg by mouth daily. Swallow whole.    ? Balsam [place]-Castor Oil (VENELEX) OINT Apply topical to sacrum and bilateral buttocks three times daily.    ? carvedilol (COREG) 6.25 MG tablet Take 1.5 tablets (9.375 mg total) by mouth 2 (two) times daily. 90 tablet 0  ? Coenzyme Q10 200 MG capsule Take 200 mg by mouth daily.    ? finasteride (PROSCAR) 5 MG tablet Take 1 tablet (5 mg total) by mouth daily. 30 tablet 0  ? folic acid (FOLVITE) 1 MG tablet Take 1 tablet (1 mg total) by mouth daily. 30 tablet 0  ? furosemide (LASIX) 40 MG tablet Take 1 tablet (40 mg total) by mouth daily. 30 tablet 0  ? gabapentin (NEURONTIN) 100 MG capsule Take 1 capsule (100 mg total) by mouth daily. 30 capsule 0  ? hydrALAZINE (APRESOLINE) 50 MG tablet Take 1 tablet (50 mg total) by mouth 3 (three) times daily. 90 tablet 0  ? melatonin 5 MG TABS Take 5 mg by mouth at bedtime.    ? methimazole (TAPAZOLE) 10 MG tablet Take 2 tablets (20 mg total) by mouth 2 (two) times daily. 120 tablet 0  ? Multiple Vitamin (MULTIVITAMIN) tablet Take 1 tablet by mouth daily.    ? polyethylene glycol (MIRALAX / GLYCOLAX) 17 g packet Take 17 g by mouth daily.    ? potassium chloride SA (KLOR-CON M) 20 MEQ tablet Take 1 tablet (20 mEq total) by mouth 2 (two) times daily. 60 tablet 0  ? pravastatin (PRAVACHOL) 20 MG tablet Take 1 tablet (20 mg total) by mouth every evening. 30 tablet 0  ? tamsulosin (FLOMAX) 0.4 MG CAPS capsule Take 1 capsule (0.4 mg total) by mouth every morning. 30  capsule 0  ? vitamin B-12 (CYANOCOBALAMIN) 500 MCG tablet Take 1 tablet (500 mcg total) by mouth daily.    ? ? ? ?Discharge Medications: ?Please see discharge summary for a list of discharge medications. ? ?Relevant Imaging Results: ? ?Relevant Lab Results: ? ? ?Additional Information ?SSN#: [id number]. Per patient, he has had three covid vaccinations. ? ?Iona Beard, LCSWA ? ? ? ? ?

## 2021-05-30 NOTE — Discharge Instructions (Addendum)
Follow up with dr. Arther Abbott next week ?

## 2021-06-03 ENCOUNTER — Non-Acute Institutional Stay (SKILLED_NURSING_FACILITY): Payer: Medicare Other | Admitting: Adult Health

## 2021-06-03 ENCOUNTER — Other Ambulatory Visit (HOSPITAL_COMMUNITY)
Admission: RE | Admit: 2021-06-03 | Discharge: 2021-06-03 | Disposition: A | Discharge status: Home / Self Care | Payer: Medicare Other | Source: Skilled Nursing Facility | Attending: Adult Health | Admitting: Adult Health

## 2021-06-03 ENCOUNTER — Encounter: Payer: Self-pay | Admitting: Adult Health

## 2021-06-03 ENCOUNTER — Other Ambulatory Visit: Payer: Self-pay | Admitting: Adult Health

## 2021-06-03 DIAGNOSIS — S42202S Unspecified fracture of upper end of left humerus, sequela: Secondary | ICD-10-CM

## 2021-06-03 DIAGNOSIS — R369 Urethral discharge, unspecified: Secondary | ICD-10-CM | POA: Insufficient documentation

## 2021-06-03 DIAGNOSIS — S42202A Unspecified fracture of upper end of left humerus, initial encounter for closed fracture: Secondary | ICD-10-CM | POA: Insufficient documentation

## 2021-06-03 MED ORDER — OXYCODONE HCL 5 MG PO TABS: 5.0000 mg | ORAL_TABLET | ORAL | 0 refills | Status: DC | PRN

## 2021-06-03 NOTE — Progress Notes (Signed)
?Location:  [facility] Room Number: 160 P ?Place of Service:  SNF (31) ?Provider:  Ok Edwards, NP ? ? ?CODE STATUS: DNR ? ?Allergies  ?Allergen Reactions  ? Indomethacin Anaphylaxis  ?  But tolerates ibuprofen, aleve  ? Iodine-131 Anaphylaxis  ? Lidocaine-Menthol   ?  Other reaction(s): Bradycardia  ? ? ?Chief Complaint  ?Patient presents with  ? Hospitalization Follow-up  ?  Hospital follow up  ? ? ?HPI: ? ?He had been discharged to home on 05-29-21. He was at home transferring himself from the wheelchair; lost balance and hit left side. His x-ray determined that he has a left proximal humerus fracture. He was discharged back to this facility. He is here once again for short term rehab. More than likely this does represent a permanent placement for him. He does have left shoulder pain which is managed. He will continue to be followed for his chronic illnesses including:  (PAF) paroxsymal atrial fibrillation:  Essential hypertension: Hyperthyroidism: Lumbar radiculopathy: ? ?Past Medical History:  ?Diagnosis Date  ? AKI (acute kidney injury) ([place]) 02/12/2019  ? CAD (coronary artery disease)   ? a. cath 01/27/2009 : s/p promus DES to LAD and medical managment of 70-80% mRCA  ? Chronic kidney disease, stage 3b ([place])   ? COVID-19   ? Essential hypertension   ? GI bleeding   ? Hematuria   ? Hyperthyroidism   ? Hypothyroidism   ? Mild atherosclerosis of carotid artery   ? Mild pulmonary hypertension ([place])   ? Mixed hyperlipidemia   ? NSTEMI (non-ST elevated myocardial infarction) [facility]) 2011  ? PAF (paroxysmal atrial fibrillation) ([place])   ? ? ?Past Surgical History:  ?Procedure Laterality Date  ? BOWEL RESECTION N/A 06/28/2019  ? Procedure: SMALL BOWEL RESECTION;  Surgeon: Virl Cagey, MD;  Location: [facility];  Service: General;  Laterality: N/A;  ? CHOLECYSTECTOMY    ? CORONARY STENT PLACEMENT    ? CYSTOSCOPY/RETROGRADE/URETEROSCOPY Bilateral 03/09/2019  ? Procedure: CYSTOSCOPY, Clot  Evacuation, [place];  Surgeon: Festus Aloe, MD;  Location: [facility];  Service: Urology;  Laterality: Bilateral;  ? HERNIA REPAIR    ? LYSIS OF ADHESION N/A 06/28/2019  ? Procedure: LYSIS OF ADHESION;  Surgeon: Virl Cagey, MD;  Location: [facility];  Service: General;  Laterality: N/A;  ? VENTRAL HERNIA REPAIR N/A 06/28/2019  ? Procedure: HERNIA REPAIR VENTRAL ADULT WITH VICRYL MESH OVERLAY;  Surgeon: Virl Cagey, MD;  Location: [facility];  Service: General;  Laterality: N/A;  ? ? ?Social History  ? ?Socioeconomic History  ? Marital status: Married  ?  Spouse name: Not on file  ? Number of children: Not on file  ? Years of education: Not on file  ? Highest education level: Not on file  ?Occupational History  ? Occupation: Charity fundraiser  ?Tobacco Use  ? Smoking status: Former  ?  Types: Cigarettes  ?  Quit date: 01/20/1957  ?  Years since quitting: 64.4  ? Smokeless tobacco: Never  ?Vaping Use  ? Vaping Use: Never used  ?Substance and Sexual Activity  ? Alcohol use: No  ? Drug use: Never  ? Sexual activity: Not on file  ?Other Topics Concern  ? Not on file  ?Social History Narrative  ? Quit smoking about 62 years ago.  ? ?Social Determinants of Health  ? ?Financial Resource Strain: Not on file  ?Food Insecurity: Not on file  ?Transportation Needs: Not on file  ?Physical Activity: Not on file  ?  Stress: Not on file  ?Social Connections: Not on file  ?Intimate Partner Violence: Not on file  ? ?Family History  ?Problem Relation Age of Onset  ? Heart attack Father   ? Lung cancer Sister   ? ? ? ? ?VITAL SIGNS ?BP 132/63   Pulse 92   Temp 98.1 ?F (36.7 ?C)   Resp 20   Ht 6' (1.829 m)   Wt (!) 306 lb 8 oz (139 kg)   SpO2 93%   BMI 41.57 kg/m?  ? ?Outpatient Encounter Medications as of 06/03/2021  ?Medication Sig  ? acetaminophen (TYLENOL) 500 MG tablet Take 500 mg by mouth every 6 (six) hours as needed.  ? aspirin EC 81 MG tablet Take 81 mg by mouth daily. Swallow whole.  ? Balsam [place]-Castor Oil (VENELEX) OINT  Apply topical to sacrum and bilateral buttocks three times daily.  ? carvedilol (COREG) 6.25 MG tablet Take 1.5 tablets (9.375 mg total) by mouth 2 (two) times daily.  ? Coenzyme Q10 200 MG capsule Take 200 mg by mouth daily.  ? finasteride (PROSCAR) 5 MG tablet Take 1 tablet (5 mg total) by mouth daily.  ? folic acid (FOLVITE) 1 MG tablet Take 1 tablet (1 mg total) by mouth daily.  ? furosemide (LASIX) 40 MG tablet Take 1 tablet (40 mg total) by mouth daily.  ? gabapentin (NEURONTIN) 100 MG capsule Take 1 capsule (100 mg total) by mouth daily.  ? hydrALAZINE (APRESOLINE) 50 MG tablet Take 1 tablet (50 mg total) by mouth 3 (three) times daily.  ? melatonin 5 MG TABS Take 5 mg by mouth at bedtime.  ? methimazole (TAPAZOLE) 10 MG tablet Take 2 tablets (20 mg total) by mouth 2 (two) times daily.  ? Multiple Vitamin (MULTIVITAMIN) tablet Take 1 tablet by mouth daily.  ? oxyCODONE (ROXICODONE) 5 MG immediate release tablet Take 1 tablet (5 mg total) by mouth every 4 (four) hours as needed for severe pain.  ? polyethylene glycol (MIRALAX / GLYCOLAX) 17 g packet Take 17 g by mouth daily.  ? potassium chloride SA (KLOR-CON M) 20 MEQ tablet Take 1 tablet (20 mEq total) by mouth 2 (two) times daily.  ? pravastatin (PRAVACHOL) 20 MG tablet Take 1 tablet (20 mg total) by mouth every evening.  ? tamsulosin (FLOMAX) 0.4 MG CAPS capsule Take 1 capsule (0.4 mg total) by mouth every morning.  ? vitamin B-12 (CYANOCOBALAMIN) 500 MCG tablet Take 1 tablet (500 mcg total) by mouth daily.  ? ?No facility-administered encounter medications on file as of 06/03/2021.  ? ? ? ?SIGNIFICANT DIAGNOSTIC EXAMS ? ?PREVIOUS  ? ?05-06-21: chest x-ray: No active disease.  ? ?05-07-21: pelvic ultrasound:  ?Foley catheter appears to be in the central low pelvis in the region of the decompressed bladder. For definitive localization a pelvic CT scan is suggested. ? ?05-08-21: ct of abdomen and pelvis ?1. Focal soft tissue of the left lateral bladder wall,  concerning for bladder neoplasm. Recommend cystoscopy for further evaluation. ?2. Mildly enlarged left pelvic lymph nodes, new when compared with prior exam. ?3. New indeterminate right adrenal gland nodule. Recommend adrenal protocol CT or MRI for further evaluation. ?4. Foley catheter balloon is inflated within the prostate. Recommend repositioning. ?5. Aortic Atherosclerosis  and Emphysema . ? ?TODAY ? ? 05-29-21: ct of head: No acute intracranial abnormality.  ? ?05-29-21: left shoulder x-ray: Displaced oblique fracture proximal LEFT humeral diaphysis.  ? ?LABS REVIEWED; PREVIOUS  ? ?05-06-21: wbc 8.1; hgb 9.4; hct 29.2 mcv  92.1 plt 215; glucose 100 bun 28; creat 1.69; k+ 4.3; na++ 138; ca 8.3; GFR 40; alk phos 198; protein 6.7 albumin 2.9 urine culture: multiple species ?05-07-21: free t3: 1.8; free t4: 1.24; vitamin B 12: 227; B 1: 104.4 ?05-08-21: urine culture: 10,000 enterococcus faecalis ?05-09-21: wbc 7.0; hgb 8.4; hct 25.7; mcv 92.4 plt 183; glucose 117; bun 20; creat 1.39; k+ 3.4; na++ 137; ca 7.7; gfr 50; mag 2.2 ? ?TODAY ? ?05-16-21; hgb 8.6; hct 27.6; glucose 123; bun 44; creat 1.44; k+ 3.4; na++ 139; ca 7.8; GFR 48 ?05-18-21: wbc 10.1; hgb 9.6; hct 30.9; mcv 91.4 plt 256; glucose 123; bun 43; creat 1.40 ;k+ 4.1; na++ 139; ca 7.9; GFR 50; urine culture: 60,000 colonies enterococcus faecalis  ?05-20-21: k+ 4.7 ?05-29-21: wbc 8.8; hgb 8.4; hct 26.6; mcv 90.5 plt 211; glucose 128; bun 30; creat 1.48; k+ 4.2; na+= 137; ca 7.7; gfr 46  ? ?Review of Systems  ?Constitutional:  Negative for malaise/fatigue.  ?Respiratory:  Negative for cough and shortness of breath.   ?Cardiovascular:  Negative for chest pain, palpitations and leg swelling.  ?Gastrointestinal:  Negative for abdominal pain, constipation and heartburn.  ?Musculoskeletal:  Positive for joint pain. Negative for back pain and myalgias.  ?     Left arm   ?Skin: Negative.   ?Neurological:  Negative for dizziness.  ?Psychiatric/Behavioral:  The patient is not  nervous/anxious.   ? ? ?Physical Exam ?Constitutional:   ?   General: He is not in acute distress. ?   Appearance: He is well-developed. He is morbidly obese. He is not diaphoretic.  ?Neck:  ?   Thyroid: No thyromegaly.  ?C

## 2021-06-04 ENCOUNTER — Non-Acute Institutional Stay (SKILLED_NURSING_FACILITY): Payer: Medicare Other | Admitting: Internal Medicine

## 2021-06-04 ENCOUNTER — Encounter: Payer: Self-pay | Admitting: Orthopedic Surgery

## 2021-06-04 ENCOUNTER — Ambulatory Visit (INDEPENDENT_AMBULATORY_CARE_PROVIDER_SITE_OTHER): Payer: Medicare Other | Admitting: Orthopedic Surgery

## 2021-06-04 ENCOUNTER — Ambulatory Visit (INDEPENDENT_AMBULATORY_CARE_PROVIDER_SITE_OTHER): Payer: Medicare Other

## 2021-06-04 ENCOUNTER — Encounter: Payer: Self-pay | Admitting: Internal Medicine

## 2021-06-04 VITALS — BP 98/38 | HR 90 | Ht 72.0 in | Wt 306.0 lb

## 2021-06-04 DIAGNOSIS — M25512 Pain in left shoulder: Secondary | ICD-10-CM

## 2021-06-04 DIAGNOSIS — S42292A Other displaced fracture of upper end of left humerus, initial encounter for closed fracture: Secondary | ICD-10-CM

## 2021-06-04 DIAGNOSIS — N1832 Chronic kidney disease, stage 3b: Secondary | ICD-10-CM

## 2021-06-04 DIAGNOSIS — D649 Anemia, unspecified: Secondary | ICD-10-CM | POA: Diagnosis not present

## 2021-06-04 DIAGNOSIS — I10 Essential (primary) hypertension: Secondary | ICD-10-CM | POA: Diagnosis not present

## 2021-06-04 DIAGNOSIS — S42202S Unspecified fracture of upper end of left humerus, sequela: Secondary | ICD-10-CM

## 2021-06-04 DIAGNOSIS — S42202A Unspecified fracture of upper end of left humerus, initial encounter for closed fracture: Secondary | ICD-10-CM | POA: Diagnosis not present

## 2021-06-04 NOTE — Assessment & Plan Note (Signed)
Current creatinine 1.48 with GFR 46 indicating CKD low stage IIIa.  Med list reviewed; no change in medications or dosage unless there is progression of the CKD. ?

## 2021-06-04 NOTE — Assessment & Plan Note (Signed)
BP controlled; no change in antihypertensive medications ?He denies any postural hypotension symptoms prior to the fall. ?

## 2021-06-04 NOTE — Assessment & Plan Note (Signed)
He saw Dr. Colon Branch today; Sarmiento brace at all times was to be worn with sling for comfort. ?PT/OT at SNF as tolerated. ?

## 2021-06-04 NOTE — Progress Notes (Signed)
NURSING HOME LOCATION: [facility] ROOM NUMBER:  160 P  CODE STATUS:  DNR  PCP:  Celene Squibb MD  This is a nursing facility follow up visit for [facility] readmission within 30 days.  Interim medical record and care since last SNF visit was updated with review of diagnostic studies and change in clinical status since last visit were documented.  HPI: He was here at the SNF for rehab from 4/20 - 05/29/2021 following a hospitalization 4/17 - 4/20 for progressive weakness over 6 weeks PTA to the point that he could not stand.  That hospitalization was complicated by anemia with nadir H/H of 8.4/28.7.  Also AKI superimposed on CKD was present with nadir GFR 40.  At discharge GFR had improved to 50 indicating stage IIIa CKD.  Protein/caloric malnutrition was manifest by total protein 5.6 and albumin of 2.4. He was here for rehab; apparently he declined to continue the co-pay and discharged home on 5/10.  Apparently upon arriving home in the company of his son; he tried to stand and fell sustaining a displaced oblique fracture of the proximal left humeral diaphysis. LUE was placed in a sling in the ED. Creatinine is 1.48 with GFR 46 indicating CKD low stage IIIa.  Normochromic, normocytic anemia is present with H/H of 8.4/26.6.  Last iron/anemia profile on record was 03/10/2019 when the iron level was 46.  In June 2021 he had surgical correction of small bowel obstruction related to a ventral hernia with a loop of bowel in the hernia.  This presented as coffee-ground emesis. He was kept in the ED overnight and admitted to the [facility] 5/11. He saw Dr. Cassell Smiles today who recommended Sarmiento brace at all times with removal for hygiene interventions.  Sling was to be used to assist for comfort.  Review of systems: He denies any cardiac or neurologic prodrome prior to his fall stating that he simply lost his balance.  Despite his anemia he denies any postural symptoms.  Also denies  any bleeding dyscrasias except for easy bruising.  He states that he had lost 5 pounds since he was admitted to the facility back in April.  His last colonoscopy was over 10 years ago and no repeat study was recommended.  He denies dysphagia or other GI symptoms. He has had a cough he states for 3 months which is mainly nonproductive with only occasional sputum production.  He states that this is associated with some wheezing.  No current chest x-ray reports are in Epic.  Constitutional: No fever, significant weight change, fatigue ( only 5#) Eyes: No redness, discharge, pain, vision change ENT/mouth: No nasal congestion,  purulent discharge, earache, change in hearing, sore throat  Cardiovascular: No chest pain, palpitations, paroxysmal nocturnal dyspnea, claudication, edema  Respiratory: No hemoptysis, significant snoring, apnea   Gastrointestinal: No heartburn, dysphagia, abdominal pain, nausea /vomiting, rectal bleeding, melena, change in bowels Genitourinary: No dysuria, hematuria, pyuria, incontinence, nocturia Dermatologic: No rash, pruritus, change in appearance of skin Neurologic: No dizziness, headache, syncope, seizures, numbness, tingling Psychiatric: No significant anxiety, depression, insomnia, anorexia Endocrine: No change in hair/skin/nails, excessive thirst, excessive hunger, excessive urination  Hematologic/lymphatic: No significant lymphadenopathy, abnormal bleeding Allergy/immunology: No itchy/watery eyes, significant sneezing, urticaria, angioedema  Physical exam:  Pertinent or positive findings: Central obesity present.  Affect is flat.  Pattern alopecia is noted.  Oropharynx cannot be visualized due to intraoral crowding.  Breath sounds are decreased; he has an intermittent nonproductive cough.  First heart  sound is increased.  Abdomen is massive.  He has 1/2+ edema at the sock line.  Pedal pulses are intact.  Stasis dermatitis changes are noted over the shins.  The MCP  joints are prominent with some associated interosseous wasting.  General appearance: Adequately nourished; no acute distress, increased work of breathing is present.   Lymphatic: No lymphadenopathy about the head, neck, axilla. Eyes: No conjunctival inflammation or lid edema is present. There is no scleral icterus. Ears:  External ear exam shows no significant lesions or deformities.   Nose:  External nasal examination shows no deformity or inflammation. Nasal mucosa are pink and moist without lesions, exudates Oral exam:  Lips and gums are healthy appearing. There is no oropharyngeal erythema or exudate. Neck:  No thyromegaly, masses, tenderness noted.    Heart:  Normal rate and regular rhythm.  S2 normal without gallop, murmur, click, rub .  Lungs:  without wheezes, rhonchi, rales, rubs. Abdomen: Bowel sounds are normal. Abdomen is soft and nontender with no organomegaly, hernias, masses. GU: Deferred  Extremities:  No cyanosis, clubbing  Neurologic exam :Balance, [place], finger to nose testing could not be completed due to clinical state Skin: Warm & dry w/o tenting. No significant rash.  See summary under each active problem in the Problem List with associated updated therapeutic plan

## 2021-06-04 NOTE — Patient Instructions (Signed)
See assessment and plan under each diagnosis in the problem list and acutely for this visit 

## 2021-06-04 NOTE — Assessment & Plan Note (Addendum)
Anemia is essentially stable with current H/H of 8.4/26.6.  Last iron panel was in February 2021 when iron level was normal at 46.  He denies any GI symptoms or bleeding dyscrasias except easy bruising.  Last colonoscopy was over 10 years ago. CBC will be monitored.  Iron panel and FOBT will be checked.

## 2021-06-04 NOTE — Patient Instructions (Signed)
Sarmiento brace at all times, can remove for hygiene.  Please do not remove if you are not comfortable replacing the brace. ? ?Sling to assist for comfort. ? ?Medications as needed. ? ?Follow-up in 2 weeks. ?

## 2021-06-05 ENCOUNTER — Encounter: Payer: Self-pay | Admitting: Orthopedic Surgery

## 2021-06-05 NOTE — Progress Notes (Signed)
New Patient Visit ? ?Assessment: ?Derek Foster is a 86 y.o. male with the following: ?1. Other closed displaced fracture of proximal end of left humerus, initial encounter ? ?Plan: ?Derek Foster has an oblique fracture through the proximal aspect of the left humerus.  This is within the proximal one third shaft.  Mild displacement, primarily on the lateral x-ray.  Patient has a significant medical comorbidities and is not a good surgical candidate.  As result, I would like to treat this without surgery.  He was placed in a Sarmiento brace, to provide some additional stability.  He should remain in a sling.  I would like to see him back in 2 weeks for repeat evaluation.  Medications as needed. ? ?Follow-up: ?Return in about 2 weeks (around 06/18/2021). ? ?Subjective: ? ?Chief Complaint  ?Patient presents with  ? New Patient (Initial Visit)  ? Shoulder Injury  ?  LT shoulder/ fall ?DOI 05/29/21  ? ? ?History of Present Illness: ?Derek Foster is a 86 y.o. male who presents for evaluation of left shoulder pain.  Patient was recently hospitalized and then went to a rehab center.  On the day that he was discharged from rehab, he returned home.  Unfortunately, his knees buckled and he fell.  On the way down, he hit his left arm.  He had immediate pain in the left shoulder.  He presented to the emergency department, where x-rays demonstrated a proximal humerus fracture.  He was placed in a sling.  He is now back in rehab.  He denies numbness and tingling distally. ? ? ?Review of Systems: ?No fevers or chills ?No numbness or tingling ?No chest pain ?No shortness of breath ?No bowel or bladder dysfunction ?No GI distress ?No headaches ? ? ?Medical History: ? ?Past Medical History:  ?Diagnosis Date  ? AKI (acute kidney injury) ([place]) 02/12/2019  ? CAD (coronary artery disease)   ? a. cath 01/27/2009 : s/p promus DES to LAD and medical managment of 70-80% mRCA  ? Chronic kidney disease, stage 3b ([place])   ? COVID-19   ? Essential  hypertension   ? GI bleeding   ? Hematuria   ? Hyperthyroidism   ? Hypothyroidism   ? Mild atherosclerosis of carotid artery   ? Mild pulmonary hypertension ([place])   ? Mixed hyperlipidemia   ? NSTEMI (non-ST elevated myocardial infarction) [facility]) 2011  ? PAF (paroxysmal atrial fibrillation) ([place])   ? ? ?Past Surgical History:  ?Procedure Laterality Date  ? BOWEL RESECTION N/A 06/28/2019  ? Procedure: SMALL BOWEL RESECTION;  Surgeon: Virl Cagey, MD;  Location: [facility];  Service: General;  Laterality: N/A;  ? CHOLECYSTECTOMY    ? CORONARY STENT PLACEMENT    ? CYSTOSCOPY/RETROGRADE/URETEROSCOPY Bilateral 03/09/2019  ? Procedure: CYSTOSCOPY, Clot Evacuation, Fulguration;  Surgeon: Festus Aloe, MD;  Location: [place];  Service: Urology;  Laterality: Bilateral;  ? HERNIA REPAIR    ? LYSIS OF ADHESION N/A 06/28/2019  ? Procedure: LYSIS OF ADHESION;  Surgeon: Virl Cagey, MD;  Location: [facility];  Service: General;  Laterality: N/A;  ? VENTRAL HERNIA REPAIR N/A 06/28/2019  ? Procedure: HERNIA REPAIR VENTRAL ADULT WITH VICRYL MESH OVERLAY;  Surgeon: Virl Cagey, MD;  Location: [facility];  Service: General;  Laterality: N/A;  ? ? ?Family History  ?Problem Relation Age of Onset  ? Heart attack Father   ? Lung cancer Sister   ? ?Social History  ? ?Tobacco Use  ? Smoking status: Former  ?  Types: Cigarettes  ?  Quit date: 01/20/1957  ?  Years since quitting: 64.4  ? Smokeless tobacco: Never  ?Vaping Use  ? Vaping Use: Never used  ?Substance Use Topics  ? Alcohol use: No  ? Drug use: Never  ? ? ?Allergies  ?Allergen Reactions  ? Indomethacin Anaphylaxis  ?  But tolerates ibuprofen, aleve  ? Iodine-131 Anaphylaxis  ? Lidocaine-Menthol   ?  Other reaction(s): Bradycardia  ? ? ?Current Meds  ?Medication Sig  ? acetaminophen (TYLENOL) 500 MG tablet Take 500 mg by mouth every 6 (six) hours as needed.  ? aspirin EC 81 MG tablet Take 81 mg by mouth daily. Swallow whole.  ? Balsam [place]-Castor Oil (VENELEX) OINT Apply topical to  sacrum and bilateral buttocks three times daily.  ? carvedilol (COREG) 6.25 MG tablet Take 1.5 tablets (9.375 mg total) by mouth 2 (two) times daily.  ? Coenzyme Q10 200 MG capsule Take 200 mg by mouth daily.  ? finasteride (PROSCAR) 5 MG tablet Take 1 tablet (5 mg total) by mouth daily.  ? folic acid (FOLVITE) 1 MG tablet Take 1 tablet (1 mg total) by mouth daily.  ? furosemide (LASIX) 40 MG tablet Take 1 tablet (40 mg total) by mouth daily.  ? gabapentin (NEURONTIN) 100 MG capsule Take 1 capsule (100 mg total) by mouth daily.  ? hydrALAZINE (APRESOLINE) 50 MG tablet Take 1 tablet (50 mg total) by mouth 3 (three) times daily.  ? melatonin 5 MG TABS Take 5 mg by mouth at bedtime.  ? methimazole (TAPAZOLE) 10 MG tablet Take 2 tablets (20 mg total) by mouth 2 (two) times daily.  ? Multiple Vitamin (MULTIVITAMIN) tablet Take 1 tablet by mouth daily.  ? oxyCODONE (ROXICODONE) 5 MG immediate release tablet Take 1 tablet (5 mg total) by mouth every 4 (four) hours as needed for severe pain.  ? polyethylene glycol (MIRALAX / GLYCOLAX) 17 g packet Take 17 g by mouth daily.  ? potassium chloride SA (KLOR-CON M) 20 MEQ tablet Take 1 tablet (20 mEq total) by mouth 2 (two) times daily.  ? pravastatin (PRAVACHOL) 20 MG tablet Take 1 tablet (20 mg total) by mouth every evening.  ? tamsulosin (FLOMAX) 0.4 MG CAPS capsule Take 1 capsule (0.4 mg total) by mouth every morning.  ? vitamin B-12 (CYANOCOBALAMIN) 500 MCG tablet Take 1 tablet (500 mcg total) by mouth daily.  ? ? ?Objective: ?BP (!) 98/38   Pulse 90   Ht 6' (1.829 m)   Wt (!) 306 lb (138.8 kg)   BMI 41.50 kg/m?  ? ?Physical Exam: ? ?General: Elderly male., Alert and oriented., No acute distress., and Seated in a wheelchair. ?Gait: Unable to ambulate. ? ?Evaluation left arm demonstrates swelling and bruising in the upper arm.  Sensation is intact in the axillary nerve distribution.  Full sensation intact in the left hand.  Fingers are warm and well-perfused.  Range of  motion deferred due to the known fracture. ? ?Of note, he has extensive atrophy of the first webspace on the right hand. ? ?IMAGING: ?I personally ordered and reviewed the following images ? ?X-rays of the left shoulder were obtained in clinic today.  There is an oblique fracture through the proximal one third shaft of the proximal humerus.  Minimal displacement on the [facility] view.  However, on the lateral, there is some mild displacement and angulation.  Glenohumeral joint is reduced.  No additional injuries noted. ? ?Impression: Oblique fracture through the proximal one third shaft  of the humerus, with residual displacement and angulation ? ? ?New Medications:  ?No orders of the defined types were placed in this encounter. ? ? ? ? ?Mordecai Rasmussen, MD ? ?06/05/2021 ?9:34 AM ? ? ?

## 2021-06-06 ENCOUNTER — Encounter: Payer: Self-pay | Admitting: Adult Health

## 2021-06-06 ENCOUNTER — Non-Acute Institutional Stay (SKILLED_NURSING_FACILITY): Payer: Medicare Other | Admitting: Adult Health

## 2021-06-06 ENCOUNTER — Other Ambulatory Visit (HOSPITAL_COMMUNITY)
Admission: RE | Admit: 2021-06-06 | Discharge: 2021-06-06 | Disposition: A | Discharge status: Home / Self Care | Payer: Medicare Other | Source: Skilled Nursing Facility | Attending: Adult Health | Admitting: Adult Health

## 2021-06-06 DIAGNOSIS — D649 Anemia, unspecified: Secondary | ICD-10-CM | POA: Insufficient documentation

## 2021-06-06 DIAGNOSIS — R369 Urethral discharge, unspecified: Secondary | ICD-10-CM

## 2021-06-06 DIAGNOSIS — D509 Iron deficiency anemia, unspecified: Secondary | ICD-10-CM | POA: Diagnosis not present

## 2021-06-06 DIAGNOSIS — R195 Other fecal abnormalities: Secondary | ICD-10-CM | POA: Insufficient documentation

## 2021-06-06 LAB — IRON AND TIBC
Iron: 38 ug/dL — ABNORMAL LOW (ref 45–182)
Saturation Ratios: 28 % (ref 17.9–39.5)
TIBC: 136 ug/dL — ABNORMAL LOW (ref 250–450)
UIBC: 98 ug/dL

## 2021-06-06 LAB — OCCULT BLOOD X 1 CARD TO LAB, STOOL: Fecal Occult Bld: NEGATIVE

## 2021-06-06 NOTE — Progress Notes (Signed)
Location:  [facility] Room Number: 160-P Place of Service:  SNF (31)   CODE STATUS: DNR  Allergies  Allergen Reactions   Indomethacin Anaphylaxis    But tolerates ibuprofen, aleve   Iodine-131 Anaphylaxis   Lidocaine-Menthol     Other reaction(s): Bradycardia    Chief Complaint  Patient presents with   Acute Visit    Follow-up labs    HPI:  He has had penile drainage which was cultured and demonstrates proteus mirabilis. His iron levels are low. No reports of increased weakness. No reports of chest pain. No bladder pain.   Past Medical History:  Diagnosis Date   AKI (acute kidney injury) ([place]) 02/12/2019   CAD (coronary artery disease)    a. cath 01/27/2009 : s/p promus DES to LAD and medical managment of 70-80% mRCA   Chronic kidney disease, stage 3b ([place])    COVID-19    Essential hypertension    GI bleeding    Hematuria    Hyperthyroidism    Hypothyroidism    Mild atherosclerosis of carotid artery    Mild pulmonary hypertension ([facility])    Mixed hyperlipidemia    NSTEMI (non-ST elevated myocardial infarction) ([place]) 2011   PAF (paroxysmal atrial fibrillation) ([place])     Past Surgical History:  Procedure Laterality Date   BOWEL RESECTION N/A 06/28/2019   Procedure: SMALL BOWEL RESECTION;  Surgeon: Virl Cagey, MD;  Location: [facility];  Service: General;  Laterality: N/A;   CHOLECYSTECTOMY     CORONARY STENT PLACEMENT     CYSTOSCOPY/RETROGRADE/URETEROSCOPY Bilateral 03/09/2019   Procedure: CYSTOSCOPY, Clot Evacuation, Fulguration;  Surgeon: Festus Aloe, MD;  Location: [facility];  Service: Urology;  Laterality: Bilateral;   HERNIA REPAIR     LYSIS OF ADHESION N/A 06/28/2019   Procedure: LYSIS OF ADHESION;  Surgeon: Virl Cagey, MD;  Location: [facility];  Service: General;  Laterality: N/A;   VENTRAL HERNIA REPAIR N/A 06/28/2019   Procedure: HERNIA REPAIR VENTRAL ADULT WITH VICRYL MESH OVERLAY;  Surgeon: Virl Cagey, MD;  Location: [facility];  Service: General;  Laterality: N/A;    Social History   Socioeconomic History   Marital status: Married    Spouse name: Not on file   Number of children: Not on file   Years of education: Not on file   Highest education level: Not on file  Occupational History   Occupation: textiles  Tobacco Use   Smoking status: Former    Types: Cigarettes    Quit date: 01/20/1957    Years since quitting: 64.4   Smokeless tobacco: Never  Vaping Use   Vaping Use: Never used  Substance and Sexual Activity   Alcohol use: No   Drug use: Never   Sexual activity: Not on file  Other Topics Concern   Not on file  Social History Narrative   Quit smoking about 62 years ago.   Social Determinants of Health   Financial Resource Strain: Not on file  Food Insecurity: Not on file  Transportation Needs: Not on file  Physical Activity: Not on file  Stress: Not on file  Social Connections: Not on file  Intimate Partner Violence: Not on file   Family History  Problem Relation Age of Onset   Heart attack Father    Lung cancer Sister       VITAL SIGNS BP 138/78   Pulse 65   Temp (!) 97.3 F (36.3 C)   Resp 20   Ht 6' (  1.829 m)   Wt (!) 306 lb 8 oz (139 kg)   SpO2 95%   BMI 41.57 kg/m   Outpatient Encounter Medications as of 06/06/2021  Medication Sig   acetaminophen (TYLENOL) 500 MG tablet Take 500 mg by mouth every 6 (six) hours as needed.   Amino Acids-Protein Hydrolys (FEEDING SUPPLEMENT, PRO-STAT SUGAR FREE 64,) LIQD Take 30 mLs by mouth 3 (three) times daily with meals. for albumin 2.9 may mix with juice   aspirin EC 81 MG tablet Take 81 mg by mouth daily. Swallow whole.   Balsam [place]-Castor Oil (VENELEX) OINT Apply topical to sacrum and bilateral buttocks three times daily.   carvedilol (COREG) 6.25 MG tablet Take 1.5 tablets (9.375 mg total) by mouth 2 (two) times daily.   ciprofloxacin (CIPRO) 500 MG tablet Take 500 mg by mouth 2 (two) times daily.  for penis drainage    Coenzyme Q10 200 MG capsule Take 200 mg by mouth daily.   Ensure Max Protein (ENSURE MAX PROTEIN) LIQD Take by mouth daily.  to promote repletion   ferrous sulfate 324 (65 Fe) MG TBEC Once A Day on Tue, Fri   finasteride (PROSCAR) 5 MG tablet Take 1 tablet (5 mg total) by mouth daily.   folic acid (FOLVITE) 1 MG tablet Take 1 tablet (1 mg total) by mouth daily.   furosemide (LASIX) 40 MG tablet Take 1 tablet (40 mg total) by mouth daily.   gabapentin (NEURONTIN) 100 MG capsule Take 1 capsule (100 mg total) by mouth daily.   guaiFENesin (MUCINEX) 600 MG 12 hr tablet Take 600 mg by mouth 2 (two) times daily.   hydrALAZINE (APRESOLINE) 50 MG tablet Take 1 tablet (50 mg total) by mouth 3 (three) times daily.   melatonin 5 MG TABS Take 5 mg by mouth at bedtime.   methimazole (TAPAZOLE) 10 MG tablet Take 2 tablets (20 mg total) by mouth 2 (two) times daily.   Multiple Vitamin (MULTIVITAMIN) tablet Take 1 tablet by mouth daily.   NON FORMULARY Diet - Regular   Nutritional Supplements (ENSURE ENLIVE PO) Twice A Day Between Meals intake 1-25%. high risk for unwanted muscle loss   oxyCODONE (ROXICODONE) 5 MG immediate release tablet Take 1 tablet (5 mg total) by mouth every 4 (four) hours as needed for severe pain.   polyethylene glycol (MIRALAX / GLYCOLAX) 17 g packet Take 17 g by mouth daily.   potassium chloride SA (KLOR-CON M) 20 MEQ tablet Take 1 tablet (20 mEq total) by mouth 2 (two) times daily.   pravastatin (PRAVACHOL) 20 MG tablet Take 1 tablet (20 mg total) by mouth every evening.   tamsulosin (FLOMAX) 0.4 MG CAPS capsule Take 1 capsule (0.4 mg total) by mouth every morning.   vitamin B-12 (CYANOCOBALAMIN) 500 MCG tablet Take 1 tablet (500 mcg total) by mouth daily.   No facility-administered encounter medications on file as of 06/06/2021.     SIGNIFICANT DIAGNOSTIC EXAMS  PREVIOUS   05-06-21: chest x-ray: No active disease.   05-07-21: pelvic ultrasound:  Foley catheter appears to be  in the central low pelvis in the region of the decompressed bladder. For definitive localization a pelvic CT scan is suggested.  05-08-21: ct of abdomen and pelvis 1. Focal soft tissue of the left lateral bladder wall, concerning for bladder neoplasm. Recommend cystoscopy for further evaluation. 2. Mildly enlarged left pelvic lymph nodes, new when compared with prior exam. 3. New indeterminate right adrenal gland nodule. Recommend adrenal protocol CT or MRI for  further evaluation. 4. Foley catheter balloon is inflated within the prostate. Recommend repositioning. 5. Aortic Atherosclerosis  and Emphysema .   05-29-21: ct of head: No acute intracranial abnormality.   05-29-21: left shoulder x-ray: Displaced oblique fracture proximal LEFT humeral diaphysis.   NO NEW EXAMS.   LABS REVIEWED; PREVIOUS   05-06-21: wbc 8.1; hgb 9.4; hct 29.2 mcv 92.1 plt 215; glucose 100 bun 28; creat 1.69; k+ 4.3; na++ 138; ca 8.3; GFR 40; alk phos 198; protein 6.7 albumin 2.9 urine culture: multiple species 05-07-21: free t3: 1.8; free t4: 1.24; vitamin B 12: 227; B 1: 104.4 05-08-21: urine culture: 10,000 enterococcus faecalis 05-09-21: wbc 7.0; hgb 8.4; hct 25.7; mcv 92.4 plt 183; glucose 117; bun 20; creat 1.39; k+ 3.4; na++ 137; ca 7.7; gfr 50; mag 2.2 05-16-21; hgb 8.6; hct 27.6; glucose 123; bun 44; creat 1.44; k+ 3.4; na++ 139; ca 7.8; GFR 48 05-18-21: wbc 10.1; hgb 9.6; hct 30.9; mcv 91.4 plt 256; glucose 123; bun 43; creat 1.40 ;k+ 4.1; na++ 139; ca 7.9; GFR 50; urine culture: 60,000 colonies enterococcus faecalis  05-20-21: k+ 4.7 05-29-21: wbc 8.8; hgb 8.4; hct 26.6; mcv 90.5 plt 211; glucose 128; bun 30; creat 1.48; k+ 4.2; na+= 137; ca 7.7; gfr 46   TODAY  06-03-21: penile drainage: proteus mirabilis: cipro 06-06-21: iron 38; tibc 136  Review of Systems  Constitutional:  Negative for malaise/fatigue.  Respiratory:  Negative for cough and shortness of breath.   Cardiovascular:  Negative for chest pain,  palpitations and leg swelling.  Gastrointestinal:  Negative for abdominal pain, constipation and heartburn.  Musculoskeletal:  Negative for back pain, joint pain and myalgias.  Skin: Negative.   Neurological:  Negative for dizziness.  Psychiatric/Behavioral:  The patient is not nervous/anxious.    Physical Exam Constitutional:      General: He is not in acute distress.    Appearance: He is well-developed. He is morbidly obese. He is not diaphoretic.  Neck:     Thyroid: No thyromegaly.  Cardiovascular:     Rate and Rhythm: Normal rate and regular rhythm.     Pulses: Normal pulses.     Heart sounds: Normal heart sounds.  Pulmonary:     Effort: Pulmonary effort is normal. No respiratory distress.     Breath sounds: Normal breath sounds.  Abdominal:     General: Bowel sounds are normal. There is no distension.     Palpations: Abdomen is soft.     Tenderness: There is no abdominal tenderness.  Musculoskeletal:     Cervical back: Neck supple.     Right lower leg: No edema.     Left lower leg: No edema.     Comments: Left arm in splint and sling   Lymphadenopathy:     Cervical: No cervical adenopathy.  Skin:    General: Skin is warm and dry.  Neurological:     Mental Status: He is alert and oriented to person, place, and time.  Psychiatric:        Mood and Affect: Mood normal.      ASSESSMENT/ PLAN:  TODAY  Chronic iron deficiency anemia Penis drainage  Will begin iron twice weekly Will begin cipro 500 mg twice daily for 7 days     Ok Edwards NP [facility]  Contact [phone] Monday through Friday 8am- 5pm  After hours call [phone]

## 2021-06-07 ENCOUNTER — Other Ambulatory Visit (HOSPITAL_COMMUNITY)
Admission: RE | Admit: 2021-06-07 | Discharge: 2021-06-07 | Disposition: A | Discharge status: Home / Self Care | Payer: Medicare Other | Source: Skilled Nursing Facility | Attending: Adult Health | Admitting: Adult Health

## 2021-06-07 DIAGNOSIS — R369 Urethral discharge, unspecified: Secondary | ICD-10-CM | POA: Insufficient documentation

## 2021-06-07 DIAGNOSIS — D649 Anemia, unspecified: Secondary | ICD-10-CM | POA: Insufficient documentation

## 2021-06-07 DIAGNOSIS — D509 Iron deficiency anemia, unspecified: Secondary | ICD-10-CM | POA: Insufficient documentation

## 2021-06-07 LAB — AEROBIC CULTURE W GRAM STAIN (SUPERFICIAL SPECIMEN): Gram Stain: NONE SEEN

## 2021-06-07 LAB — OCCULT BLOOD X 1 CARD TO LAB, STOOL: Fecal Occult Bld: NEGATIVE

## 2021-06-08 ENCOUNTER — Other Ambulatory Visit (HOSPITAL_COMMUNITY)
Admission: RE | Admit: 2021-06-08 | Discharge: 2021-06-08 | Disposition: A | Discharge status: Home / Self Care | Payer: Medicare Other | Source: Skilled Nursing Facility | Attending: Adult Health | Admitting: Adult Health

## 2021-06-08 DIAGNOSIS — R531 Weakness: Secondary | ICD-10-CM | POA: Insufficient documentation

## 2021-06-08 LAB — OCCULT BLOOD X 1 CARD TO LAB, STOOL: Fecal Occult Bld: NEGATIVE

## 2021-06-09 ENCOUNTER — Encounter (HOSPITAL_COMMUNITY)
Admission: RE | Admit: 2021-06-09 | Discharge: 2021-06-09 | Disposition: A | Discharge status: Home / Self Care | Payer: Medicare Other | Source: Skilled Nursing Facility | Attending: Adult Health | Admitting: Adult Health

## 2021-06-09 DIAGNOSIS — N390 Urinary tract infection, site not specified: Secondary | ICD-10-CM | POA: Insufficient documentation

## 2021-06-10 LAB — URINE CULTURE

## 2021-06-12 ENCOUNTER — Non-Acute Institutional Stay (SKILLED_NURSING_FACILITY): Payer: Medicare Other | Admitting: Adult Health

## 2021-06-12 ENCOUNTER — Encounter: Payer: Self-pay | Admitting: Adult Health

## 2021-06-12 DIAGNOSIS — S42202S Unspecified fracture of upper end of left humerus, sequela: Secondary | ICD-10-CM

## 2021-06-12 DIAGNOSIS — F339 Major depressive disorder, recurrent, unspecified: Secondary | ICD-10-CM

## 2021-06-12 NOTE — Progress Notes (Unsigned)
Location:  [place] Room Number: 160 P Place of Service:  SNF (31) Provider:  Ok Edwards, NP   CODE STATUS: DNR  Allergies  Allergen Reactions   Indomethacin Anaphylaxis    But tolerates ibuprofen, aleve   Iodine-131 Anaphylaxis   Lidocaine-Menthol     Other reaction(s): Bradycardia    Chief Complaint  Patient presents with   Acute Visit    Left arm swelling    HPI:  He has worsening left upper extremity swelling present.his arm is cool to touch. He is spending nearly all of his time in bed. He is withdrawn. States it is not going to get better.   Past Medical History:  Diagnosis Date   AKI (acute kidney injury) ([place]) 02/12/2019   CAD (coronary artery disease)    a. cath 01/27/2009 : s/p promus DES to LAD and medical managment of 70-80% mRCA   Chronic kidney disease, stage 3b ([place])    COVID-19    Essential hypertension    GI bleeding    Hematuria    Hyperthyroidism    Hypothyroidism    Mild atherosclerosis of carotid artery    Mild pulmonary hypertension (HCC)    Mixed hyperlipidemia    NSTEMI (non-ST elevated myocardial infarction) ([place]) 2011   PAF (paroxysmal atrial fibrillation) ([place])     Past Surgical History:  Procedure Laterality Date   BOWEL RESECTION N/A 06/28/2019   Procedure: SMALL BOWEL RESECTION;  Surgeon: Virl Cagey, MD;  Location: [facility];  Service: General;  Laterality: N/A;   CHOLECYSTECTOMY     CORONARY STENT PLACEMENT     CYSTOSCOPY/RETROGRADE/URETEROSCOPY Bilateral 03/09/2019   Procedure: CYSTOSCOPY, Clot Evacuation, Fulguration;  Surgeon: Festus Aloe, MD;  Location: [place];  Service: Urology;  Laterality: Bilateral;   HERNIA REPAIR     LYSIS OF ADHESION N/A 06/28/2019   Procedure: LYSIS OF ADHESION;  Surgeon: Virl Cagey, MD;  Location: [facility];  Service: General;  Laterality: N/A;   VENTRAL HERNIA REPAIR N/A 06/28/2019   Procedure: HERNIA REPAIR VENTRAL ADULT WITH VICRYL MESH OVERLAY;  Surgeon:  Virl Cagey, MD;  Location: [facility];  Service: General;  Laterality: N/A;    Social History   Socioeconomic History   Marital status: Married    Spouse name: Not on file   Number of children: Not on file   Years of education: Not on file   Highest education level: Not on file  Occupational History   Occupation: textiles  Tobacco Use   Smoking status: Former    Types: Cigarettes    Quit date: 01/20/1957    Years since quitting: 64.4   Smokeless tobacco: Never  Vaping Use   Vaping Use: Never used  Substance and Sexual Activity   Alcohol use: No   Drug use: Never   Sexual activity: Not on file  Other Topics Concern   Not on file  Social History Narrative   Quit smoking about 62 years ago.   Social Determinants of Health   Financial Resource Strain: Not on file  Food Insecurity: Not on file  Transportation Needs: Not on file  Physical Activity: Not on file  Stress: Not on file  Social Connections: Not on file  Intimate Partner Violence: Not on file   Family History  Problem Relation Age of Onset   Heart attack Father    Lung cancer Sister       VITAL SIGNS BP 140/63   Pulse 84   Temp (!) 97.3  F (36.3 C)   Resp 20   Ht 6' (1.829 m)   Wt 297 lb (134.7 kg)   SpO2 96%   BMI 40.28 kg/m   Outpatient Encounter Medications as of 06/12/2021  Medication Sig   acetaminophen (TYLENOL) 500 MG tablet Take 500 mg by mouth every 6 (six) hours as needed.   Amino Acids-Protein Hydrolys (FEEDING SUPPLEMENT, PRO-STAT SUGAR FREE 64,) LIQD Take 30 mLs by mouth 3 (three) times daily with meals. for albumin 2.9 may mix with juice   aspirin EC 81 MG tablet Take 81 mg by mouth daily. Swallow whole.   Balsam [place]-Castor Oil (VENELEX) OINT Apply topical to sacrum and bilateral buttocks three times daily.   carvedilol (COREG) 6.25 MG tablet Take 1.5 tablets (9.375 mg total) by mouth 2 (two) times daily.   ciprofloxacin (CIPRO) 500 MG tablet Take 500 mg by mouth 2 (two) times  daily.  for penis drainage   Coenzyme Q10 200 MG capsule Take 200 mg by mouth daily.   Ensure Max Protein (ENSURE MAX PROTEIN) LIQD Take by mouth daily.  to promote repletion   ferrous sulfate 324 (65 Fe) MG TBEC Once A Day on Tue, Fri   finasteride (PROSCAR) 5 MG tablet Take 1 tablet (5 mg total) by mouth daily.   folic acid (FOLVITE) 1 MG tablet Take 1 tablet (1 mg total) by mouth daily.   furosemide (LASIX) 40 MG tablet Take 1 tablet (40 mg total) by mouth daily.   gabapentin (NEURONTIN) 100 MG capsule Take 1 capsule (100 mg total) by mouth daily.   guaiFENesin (MUCINEX) 600 MG 12 hr tablet Take 600 mg by mouth 2 (two) times daily.   hydrALAZINE (APRESOLINE) 50 MG tablet Take 1 tablet (50 mg total) by mouth 3 (three) times daily.   melatonin 5 MG TABS Take 5 mg by mouth at bedtime.   methimazole (TAPAZOLE) 10 MG tablet Take 2 tablets (20 mg total) by mouth 2 (two) times daily.   Multiple Vitamin (MULTIVITAMIN) tablet Take 1 tablet by mouth daily.   NON FORMULARY Diet - Regular   oxyCODONE (ROXICODONE) 5 MG immediate release tablet Take 1 tablet (5 mg total) by mouth every 4 (four) hours as needed for severe pain.   polyethylene glycol (MIRALAX / GLYCOLAX) 17 g packet Take 17 g by mouth daily.   potassium chloride SA (KLOR-CON M) 20 MEQ tablet Take 1 tablet (20 mEq total) by mouth 2 (two) times daily.   pravastatin (PRAVACHOL) 20 MG tablet Take 1 tablet (20 mg total) by mouth every evening.   tamsulosin (FLOMAX) 0.4 MG CAPS capsule Take 1 capsule (0.4 mg total) by mouth every morning.   vitamin B-12 (CYANOCOBALAMIN) 500 MCG tablet Take 1 tablet (500 mcg total) by mouth daily.   [DISCONTINUED] Nutritional Supplements (ENSURE ENLIVE PO) Twice A Day Between Meals intake 1-25%. high risk for unwanted muscle loss   No facility-administered encounter medications on file as of 06/12/2021.     SIGNIFICANT DIAGNOSTIC EXAMS  PREVIOUS   05-06-21: chest x-ray: No active disease.   05-07-21: pelvic  ultrasound:  Foley catheter appears to be in the central low pelvis in the region of the decompressed bladder. For definitive localization a pelvic CT scan is suggested.  05-08-21: ct of abdomen and pelvis 1. Focal soft tissue of the left lateral bladder wall, concerning for bladder neoplasm. Recommend cystoscopy for further evaluation. 2. Mildly enlarged left pelvic lymph nodes, new when compared with prior exam. 3. New indeterminate right adrenal  gland nodule. Recommend adrenal protocol CT or MRI for further evaluation. 4. Foley catheter balloon is inflated within the prostate. Recommend repositioning. 5. Aortic Atherosclerosis  and Emphysema .   05-29-21: ct of head: No acute intracranial abnormality.   05-29-21: left shoulder x-ray: Displaced oblique fracture proximal LEFT humeral diaphysis.   NO NEW EXAMS.   LABS REVIEWED; PREVIOUS   05-06-21: wbc 8.1; hgb 9.4; hct 29.2 mcv 92.1 plt 215; glucose 100 bun 28; creat 1.69; k+ 4.3; na++ 138; ca 8.3; GFR 40; alk phos 198; protein 6.7 albumin 2.9 urine culture: multiple species 05-07-21: free t3: 1.8; free t4: 1.24; vitamin B 12: 227; B 1: 104.4 05-08-21: urine culture: 10,000 enterococcus faecalis 05-09-21: wbc 7.0; hgb 8.4; hct 25.7; mcv 92.4 plt 183; glucose 117; bun 20; creat 1.39; k+ 3.4; na++ 137; ca 7.7; gfr 50; mag 2.2 05-16-21; hgb 8.6; hct 27.6; glucose 123; bun 44; creat 1.44; k+ 3.4; na++ 139; ca 7.8; GFR 48 05-18-21: wbc 10.1; hgb 9.6; hct 30.9; mcv 91.4 plt 256; glucose 123; bun 43; creat 1.40 ;k+ 4.1; na++ 139; ca 7.9; GFR 50; urine culture: 60,000 colonies enterococcus faecalis  05-20-21: k+ 4.7 05-29-21: wbc 8.8; hgb 8.4; hct 26.6; mcv 90.5 plt 211; glucose 128; bun 30; creat 1.48; k+ 4.2; na+= 137; ca 7.7; gfr 46  06-03-21: penile drainage: proteus mirabilis: cipro 06-06-21: iron 38; tibc 136  NO NEW LABS.   Review of Systems  Constitutional:  Negative for malaise/fatigue.  Respiratory:  Negative for cough and shortness of breath.    Cardiovascular:  Negative for chest pain, palpitations and leg swelling.  Gastrointestinal:  Negative for abdominal pain, constipation and heartburn.  Musculoskeletal:  Negative for back pain, joint pain and myalgias.       Left arm swelling from fingers to shoulder.  Arm is cool to touch  Skin: Negative.   Neurological:  Negative for dizziness.  Psychiatric/Behavioral:  The patient is not nervous/anxious.    Physical Exam Constitutional:      General: He is not in acute distress.    Appearance: He is well-developed. He is morbidly obese. He is not diaphoretic.  Neck:     Thyroid: No thyromegaly.  Cardiovascular:     Rate and Rhythm: Normal rate and regular rhythm.     Heart sounds: Normal heart sounds.  Pulmonary:     Effort: Pulmonary effort is normal. No respiratory distress.     Breath sounds: Normal breath sounds.  Abdominal:     General: Bowel sounds are normal. There is no distension.     Palpations: Abdomen is soft.     Tenderness: There is no abdominal tenderness.  Musculoskeletal:     Cervical back: Neck supple.     Right lower leg: No edema.     Left lower leg: No edema.     Comments:  Left arm in splint and sling  Left arm with swelling present from fingers to shoulder.   Lymphadenopathy:     Cervical: No cervical adenopathy.  Skin:    General: Skin is warm and dry.  Neurological:     Mental Status: He is alert and oriented to person, place, and time.  Psychiatric:        Mood and Affect: Mood normal.     Comments: Depressed affect       ASSESSMENT/ PLAN:  TODAY  Closed fracture of proximal end of left humerus unspecified fracture morphology:  will setup a venous doppler of left upper extremity  2.  Depression recurrent: his emotional status is worse; will begin zoloft 50 mg daily     Ok Edwards NP [facility]   call [phone]

## 2021-06-13 DIAGNOSIS — F339 Major depressive disorder, recurrent, unspecified: Secondary | ICD-10-CM | POA: Insufficient documentation

## 2021-06-14 ENCOUNTER — Other Ambulatory Visit (HOSPITAL_COMMUNITY)
Admission: RE | Admit: 2021-06-14 | Discharge: 2021-06-14 | Disposition: A | Discharge status: Home / Self Care | Payer: Medicare Other | Source: Skilled Nursing Facility | Attending: Adult Health | Admitting: Adult Health

## 2021-06-14 DIAGNOSIS — E43 Unspecified severe protein-calorie malnutrition: Secondary | ICD-10-CM | POA: Insufficient documentation

## 2021-06-14 LAB — COMPREHENSIVE METABOLIC PANEL
ALT: 46 U/L — ABNORMAL HIGH (ref 0–44)
AST: 56 U/L — ABNORMAL HIGH (ref 15–41)
Albumin: 1.9 g/dL — ABNORMAL LOW (ref 3.5–5.0)
Alkaline Phosphatase: 450 U/L — ABNORMAL HIGH (ref 38–126)
Anion gap: 7 (ref 5–15)
BUN: 48 mg/dL — ABNORMAL HIGH (ref 8–23)
CO2: 20 mmol/L — ABNORMAL LOW (ref 22–32)
Calcium: 7.8 mg/dL — ABNORMAL LOW (ref 8.9–10.3)
Chloride: 111 mmol/L (ref 98–111)
Creatinine, Ser: 1.34 mg/dL — ABNORMAL HIGH (ref 0.61–1.24)
GFR, Estimated: 52 mL/min — ABNORMAL LOW (ref >60)
Glucose, Bld: 108 mg/dL — ABNORMAL HIGH (ref 70–99)
Potassium: 4.8 mmol/L (ref 3.5–5.1)
Sodium: 138 mmol/L (ref 135–145)
Total Bilirubin: 0.3 mg/dL (ref 0.3–1.2)
Total Protein: 5.4 g/dL — ABNORMAL LOW (ref 6.5–8.1)

## 2021-06-14 LAB — PREALBUMIN: Prealbumin: 12.1 mg/dL — ABNORMAL LOW (ref 18–38)

## 2021-06-18 ENCOUNTER — Encounter: Payer: Self-pay | Admitting: Orthopedic Surgery

## 2021-06-18 ENCOUNTER — Ambulatory Visit (INDEPENDENT_AMBULATORY_CARE_PROVIDER_SITE_OTHER): Payer: Medicare Other | Admitting: Orthopedic Surgery

## 2021-06-18 ENCOUNTER — Ambulatory Visit (INDEPENDENT_AMBULATORY_CARE_PROVIDER_SITE_OTHER): Payer: Medicare Other

## 2021-06-18 DIAGNOSIS — S42292D Other displaced fracture of upper end of left humerus, subsequent encounter for fracture with routine healing: Secondary | ICD-10-CM | POA: Diagnosis not present

## 2021-06-18 NOTE — Progress Notes (Signed)
Return patient Visit  Assessment: Derek Foster is a 86 y.o. male with the following: 1.  Left proximal humerus fracture, mild interval displacement, acceptable alignment  Plan: [name] has an oblique fracture through the proximal aspect of the left humerus.  We attempted use of a Sarmiento brace.  However, there has been slight interval worsening.  More concerning, there has been some skin breakdown, and issues related to skin care associated with the brace.  As result, we will stop using the brace.  He is to remain in the sling at all times.  He had a lot of swelling in the forearm and hand, but we are able to remove his ring from the left ring finger in clinic today.  Continue to monitor the skin.  Range of motion of the forearm wrist hand to improve swelling.  Follow-up in approximately 3 weeks.   Follow-up: Return in about 3 weeks (around 07/09/2021).  Subjective:  Chief Complaint  Patient presents with   Fracture    LT shoulder DOI 05/29/21    History of Present Illness: WM SAHAGUN is a 86 y.o. male who returns for evaluation of left shoulder pain.  He sustained a left proximal humerus fracture approximately 3 weeks ago.  When I saw him in clinic, we placed him in a Sarmiento brace.  He has been wearing this, but there has been some skin breakdown.  In clinic today, there is no stockinette underneath the brace.  His pain has been stable.  They have noticed swelling in the hand, and they are concerned about having to cut his the ring off.  Review of Systems: No fevers or chills No numbness or tingling No chest pain No shortness of breath No bowel or bladder dysfunction No GI distress No headaches  Objective: Ht (P) 6' (1.829 m)   Wt (P) 297 lb (134.7 kg)   BMI (P) 40.28 kg/m   Physical Exam:  General: Elderly male., Alert and oriented., No acute distress., and Seated in a wheelchair. Gait: Unable to ambulate.  Left arm with improved bruising.  He does have soft  swelling diffusely in the left forearm, dorsum of the hand, and into his fingers.  Specifically, in the ring finger, there is noticeable swelling, without loss of blood flow to the distal finger.  He is able to range all fingers.  Wrist motion is intact.  There is some blistering and skin breakdown on the medial aspect of the upper arm.  No purulence is appreciated.  IMAGING: I personally ordered and reviewed the following images  X-rays of the left shoulder were obtained in clinic today.  There is an oblique fracture through the proximal one third of the left humerus.  There is mild valgus alignment of this fracture as noted on available x-rays.  No additional injuries are noted.  Glenohumeral joint remains reduced.  Impression: Left proximal humerus fracture, with mild valgus alignment   New Medications:  No orders of the defined types were placed in this encounter.     Mordecai Rasmussen, MD  06/18/2021 10:53 AM

## 2021-06-18 NOTE — Patient Instructions (Signed)
Sling at all times for an additional 3 weeks  Hand, wrist and finger range of motion as tolerated to help with the swelling  Follow up in 3 weeks for repeat evaluation

## 2021-06-20 ENCOUNTER — Encounter: Payer: Self-pay | Admitting: Adult Health

## 2021-06-20 ENCOUNTER — Non-Acute Institutional Stay (SKILLED_NURSING_FACILITY): Payer: Medicare Other | Admitting: Adult Health

## 2021-06-20 ENCOUNTER — Other Ambulatory Visit: Payer: Self-pay | Admitting: Adult Health

## 2021-06-20 ENCOUNTER — Other Ambulatory Visit (HOSPITAL_COMMUNITY): Payer: Self-pay | Admitting: Adult Health

## 2021-06-20 DIAGNOSIS — L02432 Carbuncle of left axilla: Secondary | ICD-10-CM

## 2021-06-20 DIAGNOSIS — E041 Nontoxic single thyroid nodule: Secondary | ICD-10-CM | POA: Diagnosis not present

## 2021-06-20 DIAGNOSIS — E063 Autoimmune thyroiditis: Secondary | ICD-10-CM

## 2021-06-20 NOTE — Progress Notes (Signed)
Location:  [place] Room Number: 160-P Place of Service:  SNF (31)   CODE STATUS: DNR  Allergies  Allergen Reactions   Indomethacin Anaphylaxis    But tolerates ibuprofen, aleve   Iodine-131 Anaphylaxis   Lidocaine-Menthol     Other reaction(s): Bradycardia    Chief Complaint  Patient presents with   Acute Visit    Labs    HPI:  His pre-albumin is 12.1. he is presently on prostat three times daily. He does complain of nausea. His left axilla has a boil which has opened up. There is foul purulent drainage present. With redness present. No reports of fevers present.   Past Medical History:  Diagnosis Date   AKI (acute kidney injury) ([place]) 02/12/2019   CAD (coronary artery disease)    a. cath 01/27/2009 : s/p promus DES to LAD and medical managment of 70-80% mRCA   Chronic kidney disease, stage 3b ([place])    COVID-19    Essential hypertension    GI bleeding    Hematuria    Hyperthyroidism    Hypothyroidism    Mild atherosclerosis of carotid artery    Mild pulmonary hypertension (HCC)    Mixed hyperlipidemia    NSTEMI (non-ST elevated myocardial infarction) ([place]) 2011   PAF (paroxysmal atrial fibrillation) ([place])     Past Surgical History:  Procedure Laterality Date   BOWEL RESECTION N/A 06/28/2019   Procedure: SMALL BOWEL RESECTION;  Surgeon: Virl Cagey, MD;  Location: [facility];  Service: General;  Laterality: N/A;   CHOLECYSTECTOMY     CORONARY STENT PLACEMENT     CYSTOSCOPY/RETROGRADE/URETEROSCOPY Bilateral 03/09/2019   Procedure: CYSTOSCOPY, Clot Evacuation, Fulguration;  Surgeon: Festus Aloe, MD;  Location: [facility];  Service: Urology;  Laterality: Bilateral;   HERNIA REPAIR     LYSIS OF ADHESION N/A 06/28/2019   Procedure: LYSIS OF ADHESION;  Surgeon: Virl Cagey, MD;  Location: [facility];  Service: General;  Laterality: N/A;   VENTRAL HERNIA REPAIR N/A 06/28/2019   Procedure: HERNIA REPAIR VENTRAL ADULT WITH VICRYL MESH OVERLAY;   Surgeon: Virl Cagey, MD;  Location: [facility];  Service: General;  Laterality: N/A;    Social History   Socioeconomic History   Marital status: Married    Spouse name: Not on file   Number of children: Not on file   Years of education: Not on file   Highest education level: Not on file  Occupational History   Occupation: textiles  Tobacco Use   Smoking status: Former    Types: Cigarettes    Quit date: 01/20/1957    Years since quitting: 64.4   Smokeless tobacco: Never  Vaping Use   Vaping Use: Never used  Substance and Sexual Activity   Alcohol use: No   Drug use: Never   Sexual activity: Not on file  Other Topics Concern   Not on file  Social History Narrative   Quit smoking about 62 years ago.   Social Determinants of Health   Financial Resource Strain: Not on file  Food Insecurity: Not on file  Transportation Needs: Not on file  Physical Activity: Not on file  Stress: Not on file  Social Connections: Not on file  Intimate Partner Violence: Not on file   Family History  Problem Relation Age of Onset   Heart attack Father    Lung cancer Sister       VITAL SIGNS BP (!) 128/53   Pulse 76   Temp 97.8 F (  36.6 C)   Resp 20   Ht 6' (1.829 m)   Wt 293 lb 4.8 oz (133 kg)   SpO2 96%   BMI 39.78 kg/m   Outpatient Encounter Medications as of 06/20/2021  Medication Sig   acetaminophen (TYLENOL) 500 MG tablet Take 500 mg by mouth every 6 (six) hours as needed.   Amino Acids-Protein Hydrolys (FEEDING SUPPLEMENT, PRO-STAT SUGAR FREE 64,) LIQD Take 30 mLs by mouth 3 (three) times daily with meals. for albumin 2.9 may mix with juice   aspirin EC 81 MG tablet Take 81 mg by mouth daily. Swallow whole.   Balsam [place]-Castor Oil (VENELEX) OINT Apply topical to sacrum and bilateral buttocks three times daily.   carvedilol (COREG) 6.25 MG tablet Take 1.5 tablets (9.375 mg total) by mouth 2 (two) times daily.   Coenzyme Q10 200 MG capsule Take 200 mg by mouth daily.    Dextromethorphan-guaiFENesin (ROBITUSSIN DM PO) Take 15 mLs by mouth every 6 (six) hours as needed (cough).   DOXYCYCLINE HYCLATE PO Take 100 mg by mouth in the morning and at bedtime. for left axilla cyst   Ensure Max Protein (ENSURE MAX PROTEIN) LIQD Take by mouth daily.  to promote repletion   ferrous sulfate 324 (65 Fe) MG TBEC Once A Day on Tue, Fri   finasteride (PROSCAR) 5 MG tablet Take 1 tablet (5 mg total) by mouth daily.   folic acid (FOLVITE) 1 MG tablet Take 1 tablet (1 mg total) by mouth daily.   furosemide (LASIX) 40 MG tablet Take 1 tablet (40 mg total) by mouth daily.   gabapentin (NEURONTIN) 100 MG capsule Take 1 capsule (100 mg total) by mouth daily.   guaiFENesin (MUCINEX) 600 MG 12 hr tablet Take 600 mg by mouth 2 (two) times daily.   hydrALAZINE (APRESOLINE) 50 MG tablet Take 1 tablet (50 mg total) by mouth 3 (three) times daily.   melatonin 5 MG TABS Take 5 mg by mouth at bedtime.   methimazole (TAPAZOLE) 10 MG tablet Take 2 tablets (20 mg total) by mouth 2 (two) times daily.   Multiple Vitamin (MULTIVITAMIN) tablet Take 1 tablet by mouth daily.   NON FORMULARY Diet - Regular   ondansetron (ZOFRAN-ODT) 4 MG disintegrating tablet Take 4 mg by mouth every 8 (eight) hours as needed for nausea or vomiting.   polyethylene glycol (MIRALAX / GLYCOLAX) 17 g packet Take 17 g by mouth daily.   potassium chloride SA (KLOR-CON M) 20 MEQ tablet Take 1 tablet (20 mEq total) by mouth 2 (two) times daily.   pravastatin (PRAVACHOL) 20 MG tablet Take 1 tablet (20 mg total) by mouth every evening.   tamsulosin (FLOMAX) 0.4 MG CAPS capsule Take 1 capsule (0.4 mg total) by mouth every morning.   vitamin B-12 (CYANOCOBALAMIN) 500 MCG tablet Take 1 tablet (500 mcg total) by mouth daily.   [DISCONTINUED] oxyCODONE (ROXICODONE) 5 MG immediate release tablet Take 1 tablet (5 mg total) by mouth every 4 (four) hours as needed for severe pain.   No facility-administered encounter medications on file  as of 06/20/2021.     SIGNIFICANT DIAGNOSTIC EXAMS  PREVIOUS   05-06-21: chest x-ray: No active disease.   05-07-21: pelvic ultrasound:  Foley catheter appears to be in the central low pelvis in the region of the decompressed bladder. For definitive localization a pelvic CT scan is suggested.  05-08-21: ct of abdomen and pelvis 1. Focal soft tissue of the left lateral bladder wall, concerning for bladder neoplasm. Recommend  cystoscopy for further evaluation. 2. Mildly enlarged left pelvic lymph nodes, new when compared with prior exam. 3. New indeterminate right adrenal gland nodule. Recommend adrenal protocol CT or MRI for further evaluation. 4. Foley catheter balloon is inflated within the prostate. Recommend repositioning. 5. Aortic Atherosclerosis  and Emphysema .   05-29-21: ct of head: No acute intracranial abnormality.   05-29-21: left shoulder x-ray: Displaced oblique fracture proximal LEFT humeral diaphysis.   TODAY  06-13-21: left upper extremity venous ultrasound:  No dvt within the visualized veins of left upper extremity Enlarged and heterogeneous left thyroid nodule; may be seen in hashimoto's thyroiditis.    LABS REVIEWED; PREVIOUS   05-06-21: wbc 8.1; hgb 9.4; hct 29.2 mcv 92.1 plt 215; glucose 100 bun 28; creat 1.69; k+ 4.3; na++ 138; ca 8.3; GFR 40; alk phos 198; protein 6.7 albumin 2.9 urine culture: multiple species 05-07-21: free t3: 1.8; free t4: 1.24; vitamin B 12: 227; B 1: 104.4 05-08-21: urine culture: 10,000 enterococcus faecalis 05-09-21: wbc 7.0; hgb 8.4; hct 25.7; mcv 92.4 plt 183; glucose 117; bun 20; creat 1.39; k+ 3.4; na++ 137; ca 7.7; gfr 50; mag 2.2 05-16-21; hgb 8.6; hct 27.6; glucose 123; bun 44; creat 1.44; k+ 3.4; na++ 139; ca 7.8; GFR 48 05-18-21: wbc 10.1; hgb 9.6; hct 30.9; mcv 91.4 plt 256; glucose 123; bun 43; creat 1.40 ;k+ 4.1; na++ 139; ca 7.9; GFR 50; urine culture: 60,000 colonies enterococcus faecalis  05-20-21: k+ 4.7 05-29-21: wbc 8.8; hgb  8.4; hct 26.6; mcv 90.5 plt 211; glucose 128; bun 30; creat 1.48; k+ 4.2; na+= 137; ca 7.7; gfr 46  06-03-21: penile drainage: proteus mirabilis: cipro 06-06-21: iron 38; tibc 136  TODAY  06-18-21: pre-albumin 12.1    Review of Systems  Constitutional:  Negative for malaise/fatigue.  Respiratory:  Negative for cough and shortness of breath.   Cardiovascular:  Negative for chest pain, palpitations and leg swelling.  Gastrointestinal:  Negative for abdominal pain, constipation and heartburn.  Musculoskeletal:  Negative for back pain, joint pain and myalgias.  Skin:        Sore in left "armpit"  Neurological:  Negative for dizziness.  Psychiatric/Behavioral:  The patient is not nervous/anxious.     Physical Exam Constitutional:      General: He is not in acute distress.    Appearance: He is well-developed. He is not diaphoretic.  Neck:     Thyroid: No thyromegaly.  Cardiovascular:     Rate and Rhythm: Normal rate and regular rhythm.     Heart sounds: Normal heart sounds.  Pulmonary:     Effort: Pulmonary effort is normal. No respiratory distress.     Breath sounds: Normal breath sounds.  Abdominal:     General: Bowel sounds are normal. There is no distension.     Palpations: Abdomen is soft.     Tenderness: There is no abdominal tenderness.  Musculoskeletal:        General: Normal range of motion.     Cervical back: Neck supple.  Lymphadenopathy:     Cervical: No cervical adenopathy.  Skin:    General: Skin is warm and dry.  Neurological:     Mental Status: He is alert. Mental status is at baseline.  Psychiatric:        Mood and Affect: Mood normal.     ASSESSMENT/ PLAN:  TODAY  Thyroid nodule Left axilla carbuncle  Will begin doxycycline 100 mg twice daily through 07-04-21 Will check antithyroid antibodies; tsh; free t4.  Will monitor his status.    Ok Edwards NP [facility]   call [phone]

## 2021-06-21 ENCOUNTER — Encounter: Payer: Self-pay | Admitting: Adult Health

## 2021-06-21 ENCOUNTER — Other Ambulatory Visit: Payer: Self-pay | Admitting: Adult Health

## 2021-06-21 ENCOUNTER — Non-Acute Institutional Stay (SKILLED_NURSING_FACILITY): Payer: Medicare Other | Admitting: Adult Health

## 2021-06-21 DIAGNOSIS — N1832 Chronic kidney disease, stage 3b: Secondary | ICD-10-CM

## 2021-06-21 DIAGNOSIS — I700 Atherosclerosis of aorta: Secondary | ICD-10-CM | POA: Diagnosis not present

## 2021-06-21 DIAGNOSIS — S42292D Other displaced fracture of upper end of left humerus, subsequent encounter for fracture with routine healing: Secondary | ICD-10-CM | POA: Diagnosis not present

## 2021-06-21 MED ORDER — FENTANYL 12 MCG/HR TD PT72: 1.0000 | MEDICATED_PATCH | TRANSDERMAL | 0 refills | Status: AC

## 2021-06-21 NOTE — Progress Notes (Signed)
Location:  [facility] Room Number: 160-P Place of Service:  SNF (31)   CODE STATUS: DNR  Allergies  Allergen Reactions   Indomethacin Anaphylaxis    But tolerates ibuprofen, aleve   Iodine-131 Anaphylaxis   Lidocaine-Menthol     Other reaction(s): Bradycardia    Chief Complaint  Patient presents with   Acute Visit    Care plan meeting    HPI:  We have come together for his care plan meeting. Family present.  BIMS 15/15 mood 9/30: not sleeping well; decreased appetite and energy. He is nonambulatory no further falls. He requires extensive to dependent with his adls. Has foley incontinent of bowel. Dietary: weight is 293.3 has lost 4 more pounds. His albumin is 1.9 with prealbumin 12.3. he has a poor appetite. He is on prostat three times daily. Therapy: max assist for mobility; max assist to sit on side of bed.max assist for upper and lower body care. Increased difficulty moving neck with splint and sling. He continues to be followed for his chronic illnesses including:   Aortic atherosclerosis  Other closed displaced fracture  of proximal end of left humerus with routine healing subsequent encounter  Stage 3b chronic kidney disease  Past Medical History:  Diagnosis Date   AKI (acute kidney injury) ([place]) 02/12/2019   CAD (coronary artery disease)    a. cath 01/27/2009 : s/p promus DES to LAD and medical managment of 70-80% mRCA   Chronic kidney disease, stage 3b ([place])    COVID-19    Essential hypertension    GI bleeding    Hematuria    Hyperthyroidism    Hypothyroidism    Mild atherosclerosis of carotid artery    Mild pulmonary hypertension (HCC)    Mixed hyperlipidemia    NSTEMI (non-ST elevated myocardial infarction) ([place]) 2011   PAF (paroxysmal atrial fibrillation) ([place])     Past Surgical History:  Procedure Laterality Date   BOWEL RESECTION N/A 06/28/2019   Procedure: SMALL BOWEL RESECTION;  Surgeon: Virl Cagey, MD;  Location: [facility];   Service: General;  Laterality: N/A;   CHOLECYSTECTOMY     CORONARY STENT PLACEMENT     CYSTOSCOPY/RETROGRADE/URETEROSCOPY Bilateral 03/09/2019   Procedure: CYSTOSCOPY, Clot Evacuation, Fulguration;  Surgeon: Festus Aloe, MD;  Location: [facility];  Service: Urology;  Laterality: Bilateral;   HERNIA REPAIR     LYSIS OF ADHESION N/A 06/28/2019   Procedure: LYSIS OF ADHESION;  Surgeon: Virl Cagey, MD;  Location: [facility];  Service: General;  Laterality: N/A;   VENTRAL HERNIA REPAIR N/A 06/28/2019   Procedure: HERNIA REPAIR VENTRAL ADULT WITH VICRYL MESH OVERLAY;  Surgeon: Virl Cagey, MD;  Location: [facility];  Service: General;  Laterality: N/A;    Social History   Socioeconomic History   Marital status: Married    Spouse name: Not on file   Number of children: Not on file   Years of education: Not on file   Highest education level: Not on file  Occupational History   Occupation: textiles  Tobacco Use   Smoking status: Former    Types: Cigarettes    Quit date: 01/20/1957    Years since quitting: 64.4   Smokeless tobacco: Never  Vaping Use   Vaping Use: Never used  Substance and Sexual Activity   Alcohol use: No   Drug use: Never   Sexual activity: Not on file  Other Topics Concern   Not on file  Social History Narrative   Quit smoking  about 62 years ago.   Social Determinants of Health   Financial Resource Strain: Not on file  Food Insecurity: Not on file  Transportation Needs: Not on file  Physical Activity: Not on file  Stress: Not on file  Social Connections: Not on file  Intimate Partner Violence: Not on file   Family History  Problem Relation Age of Onset   Heart attack Father    Lung cancer Sister       VITAL SIGNS BP (!) 128/53   Pulse 76   Temp 97.8 F (36.6 C)   Resp 20   Ht 6' (1.829 m)   Wt 293 lb 4.8 oz (133 kg)   SpO2 96%   BMI 39.78 kg/m   Outpatient Encounter Medications as of 06/21/2021  Medication Sig   acetaminophen (TYLENOL)  500 MG tablet Take 500 mg by mouth every 6 (six) hours as needed.   Amino Acids-Protein Hydrolys (FEEDING SUPPLEMENT, PRO-STAT SUGAR FREE 64,) LIQD Take 30 mLs by mouth 3 (three) times daily with meals. for albumin 2.9 may mix with juice   aspirin EC 81 MG tablet Take 81 mg by mouth daily. Swallow whole.   Balsam [place]-Castor Oil (VENELEX) OINT Apply topical to sacrum and bilateral buttocks three times daily.   carvedilol (COREG) 6.25 MG tablet Take 1.5 tablets (9.375 mg total) by mouth 2 (two) times daily.   Coenzyme Q10 200 MG capsule Take 200 mg by mouth daily.   Dextromethorphan-guaiFENesin (ROBITUSSIN DM PO) Take 15 mLs by mouth every 6 (six) hours as needed (cough).   DOXYCYCLINE HYCLATE PO Take 100 mg by mouth in the morning and at bedtime. for left axilla cyst   Ensure Max Protein (ENSURE MAX PROTEIN) LIQD Take by mouth daily.  to promote repletion   ferrous sulfate 324 (65 Fe) MG TBEC Once A Day on Tue, Fri   finasteride (PROSCAR) 5 MG tablet Take 1 tablet (5 mg total) by mouth daily.   folic acid (FOLVITE) 1 MG tablet Take 1 tablet (1 mg total) by mouth daily.   furosemide (LASIX) 40 MG tablet Take 1 tablet (40 mg total) by mouth daily.   gabapentin (NEURONTIN) 100 MG capsule Take 1 capsule (100 mg total) by mouth daily.   guaiFENesin (MUCINEX) 600 MG 12 hr tablet Take 600 mg by mouth 2 (two) times daily.   hydrALAZINE (APRESOLINE) 50 MG tablet Take 1 tablet (50 mg total) by mouth 3 (three) times daily.   melatonin 5 MG TABS Take 5 mg by mouth at bedtime.   Multiple Vitamin (MULTIVITAMIN) tablet Take 1 tablet by mouth daily.   NON FORMULARY Diet - Regular   ondansetron (ZOFRAN-ODT) 4 MG disintegrating tablet Take 4 mg by mouth every 8 (eight) hours as needed for nausea or vomiting.   polyethylene glycol (MIRALAX / GLYCOLAX) 17 g packet Take 17 g by mouth daily.   potassium chloride SA (KLOR-CON M) 20 MEQ tablet Take 1 tablet (20 mEq total) by mouth 2 (two) times daily.   pravastatin  (PRAVACHOL) 20 MG tablet Take 1 tablet (20 mg total) by mouth every evening.   sertraline (ZOLOFT) 50 MG tablet Take 50 mg by mouth daily. situational depression   tamsulosin (FLOMAX) 0.4 MG CAPS capsule Take 1 capsule (0.4 mg total) by mouth every morning.   vitamin B-12 (CYANOCOBALAMIN) 500 MCG tablet Take 1 tablet (500 mcg total) by mouth daily.   methimazole (TAPAZOLE) 10 MG tablet Take 2 tablets (20 mg total) by mouth 2 (two) times  daily.   No facility-administered encounter medications on file as of 06/21/2021.     SIGNIFICANT DIAGNOSTIC EXAMS  PREVIOUS   05-06-21: chest x-ray: No active disease.   05-07-21: pelvic ultrasound:  Foley catheter appears to be in the central low pelvis in the region of the decompressed bladder. For definitive localization a pelvic CT scan is suggested.  05-08-21: ct of abdomen and pelvis 1. Focal soft tissue of the left lateral bladder wall, concerning for bladder neoplasm. Recommend cystoscopy for further evaluation. 2. Mildly enlarged left pelvic lymph nodes, new when compared with prior exam. 3. New indeterminate right adrenal gland nodule. Recommend adrenal protocol CT or MRI for further evaluation. 4. Foley catheter balloon is inflated within the prostate. Recommend repositioning. 5. Aortic Atherosclerosis  and Emphysema .   05-29-21: ct of head: No acute intracranial abnormality.   05-29-21: left shoulder x-ray: Displaced oblique fracture proximal LEFT humeral diaphysis.   06-13-21: left upper extremity venous doppler:  No dvt within the visualized veins of left upper extremity Enlarged and heterogeneous left thyroid nodule may be seen in hashimotos thyroiditis.   NO NEW EXAMS.   LABS REVIEWED; PREVIOUS   05-06-21: wbc 8.1; hgb 9.4; hct 29.2 mcv 92.1 plt 215; glucose 100 bun 28; creat 1.69; k+ 4.3; na++ 138; ca 8.3; GFR 40; alk phos 198; protein 6.7 albumin 2.9 urine culture: multiple species 05-07-21: free t3: 1.8; free t4: 1.24; vitamin B 12:  227; B 1: 104.4 05-08-21: urine culture: 10,000 enterococcus faecalis 05-09-21: wbc 7.0; hgb 8.4; hct 25.7; mcv 92.4 plt 183; glucose 117; bun 20; creat 1.39; k+ 3.4; na++ 137; ca 7.7; gfr 50; mag 2.2 05-16-21; hgb 8.6; hct 27.6; glucose 123; bun 44; creat 1.44; k+ 3.4; na++ 139; ca 7.8; GFR 48 05-18-21: wbc 10.1; hgb 9.6; hct 30.9; mcv 91.4 plt 256; glucose 123; bun 43; creat 1.40 ;k+ 4.1; na++ 139; ca 7.9; GFR 50; urine culture: 60,000 colonies enterococcus faecalis  05-20-21: k+ 4.7 05-29-21: wbc 8.8; hgb 8.4; hct 26.6; mcv 90.5 plt 211; glucose 128; bun 30; creat 1.48; k+ 4.2; na+= 137; ca 7.7; gfr 46  06-03-21: penile drainage: proteus mirabilis: cipro 06-06-21: iron 38; tibc 136  NO NEW LABS.   Review of Systems  Constitutional:  Negative for malaise/fatigue.  Respiratory:  Negative for cough and shortness of breath.   Cardiovascular:  Negative for chest pain, palpitations and leg swelling.  Gastrointestinal:  Negative for abdominal pain, constipation and heartburn.  Musculoskeletal:  Positive for joint pain. Negative for back pain and myalgias.       Left arm pain   Skin: Negative.   Neurological:  Negative for dizziness.  Psychiatric/Behavioral:  The patient is not nervous/anxious.  .  Physical Exam Constitutional:      General: He is not in acute distress.    Appearance: He is well-developed. He is morbidly obese. He is not diaphoretic.  Neck:     Thyroid: No thyromegaly.  Cardiovascular:     Rate and Rhythm: Normal rate and regular rhythm.     Pulses: Normal pulses.     Heart sounds: Normal heart sounds.  Pulmonary:     Effort: Pulmonary effort is normal. No respiratory distress.     Breath sounds: Normal breath sounds.  Abdominal:     General: Bowel sounds are normal. There is no distension.     Palpations: Abdomen is soft.     Tenderness: There is no abdominal tenderness.  Musculoskeletal:     Cervical back: Neck  supple.     Right lower leg: No edema.     Left lower leg:  No edema.     Comments:   Left arm in splint and sling  Left arm with swelling present from fingers to shoulder.   Lymphadenopathy:     Cervical: No cervical adenopathy.  Skin:    General: Skin is warm and dry.  Neurological:     Mental Status: He is alert. Mental status is at baseline.  Psychiatric:        Mood and Affect: Mood normal.     ASSESSMENT/ PLAN:  TODAY  Aortic atherosclerosis Other closed displaced fracture  of proximal end of left humerus with routine healing subsequent encounter Stage 3b chronic kidney disease  Will stop the following medications: all vitamins; lasix k+ pravachol  prostat.  Will change to zofran 4 mg ac and hs.  For his comfort will begin fentanyl 12 mcg patch.  Will continue current plan of care Will continue to monitor his status. His goal at this time is to be long term patient    Time spent with patient 40 minutes: medications; pain; therapy; goals of care. 20 minutes spent with advanced directives MOST form filled out: comfort care.    Ok Edwards NP [facility]   call [phone]

## 2021-06-24 ENCOUNTER — Non-Acute Institutional Stay (SKILLED_NURSING_FACILITY): Payer: Medicare Other | Admitting: Adult Health

## 2021-06-24 ENCOUNTER — Encounter: Payer: Self-pay | Admitting: Adult Health

## 2021-06-24 DIAGNOSIS — L02432 Carbuncle of left axilla: Secondary | ICD-10-CM | POA: Insufficient documentation

## 2021-06-24 DIAGNOSIS — E041 Nontoxic single thyroid nodule: Secondary | ICD-10-CM | POA: Insufficient documentation

## 2021-06-24 DIAGNOSIS — R627 Adult failure to thrive: Secondary | ICD-10-CM | POA: Diagnosis not present

## 2021-06-24 DIAGNOSIS — F4323 Adjustment disorder with mixed anxiety and depressed mood: Secondary | ICD-10-CM

## 2021-06-24 NOTE — Progress Notes (Unsigned)
Location:  [facility] Room Number: S/106/P Place of Service:  SNF (31), Derek Edwards S.,Derek Foster    CODE STATUS: DNR  Allergies  Allergen Reactions   Indomethacin Anaphylaxis    But tolerates ibuprofen, aleve   Iodine-131 Anaphylaxis   Lidocaine-Menthol     Other reaction(s): Bradycardia    Chief Complaint  Patient presents with   Acute Visit    Patient is here for status update    HPI:  Derek Foster is a 86 year old man with a medical history which includes: atrial fibrillation; stage 3b ckd; and depression. Derek Foster is here after a fall at home and suffering a left proximal humerus fracture. Derek Foster has been struggling at home for some period of time. His family has decided to make this a long term placement for him. Since that time Derek Foster has continued to decline. Derek Foster is no longer getting out of bed. Continues to have pain in his left arm and has generalized pain. Derek Foster was started on fentanyl 12 mcg patch. Today Derek Foster is not taking medications; is not eating or drinking. Derek Foster is not talking today; but appears to be comfortable; which is the goal of his care.   Past Medical History:  Diagnosis Date   AKI (acute kidney injury) ([place]) 02/12/2019   CAD (coronary artery disease)    a. cath 01/27/2009 : s/p promus DES to LAD and medical managment of 70-80% mRCA   Chronic kidney disease, stage 3b ([place])    COVID-19    Essential hypertension    GI bleeding    Hematuria    Hyperthyroidism    Hypothyroidism    Mild atherosclerosis of carotid artery    Mild pulmonary hypertension (HCC)    Mixed hyperlipidemia    NSTEMI (non-ST elevated myocardial infarction) ([place]) 2011   PAF (paroxysmal atrial fibrillation) ([place])     Past Surgical History:  Procedure Laterality Date   BOWEL RESECTION N/A 06/28/2019   Procedure: SMALL BOWEL RESECTION;  Surgeon: Virl Cagey, MD;  Location: [facility];  Service: General;  Laterality: N/A;   CHOLECYSTECTOMY     CORONARY STENT PLACEMENT      CYSTOSCOPY/RETROGRADE/URETEROSCOPY Bilateral 03/09/2019   Procedure: CYSTOSCOPY, Clot Evacuation, Fulguration;  Surgeon: Festus Aloe, MD;  Location: [facility];  Service: Urology;  Laterality: Bilateral;   HERNIA REPAIR     LYSIS OF ADHESION N/A 06/28/2019   Procedure: LYSIS OF ADHESION;  Surgeon: Virl Cagey, MD;  Location: [facility];  Service: General;  Laterality: N/A;   VENTRAL HERNIA REPAIR N/A 06/28/2019   Procedure: HERNIA REPAIR VENTRAL ADULT WITH VICRYL MESH OVERLAY;  Surgeon: Virl Cagey, MD;  Location: [facility];  Service: General;  Laterality: N/A;    Social History   Socioeconomic History   Marital status: Married    Spouse name: Not on file   Number of children: Not on file   Years of education: Not on file   Highest education level: Not on file  Occupational History   Occupation: textiles  Tobacco Use   Smoking status: Former    Types: Cigarettes    Quit date: 01/20/1957    Years since quitting: 64.4   Smokeless tobacco: Never  Vaping Use   Vaping Use: Never used  Substance and Sexual Activity   Alcohol use: No   Drug use: Never   Sexual activity: Not on file  Other Topics Concern   Not on file  Social History Narrative   Quit smoking about 62 years ago.  Social Determinants of Health   Financial Resource Strain: Not on file  Food Insecurity: Not on file  Transportation Needs: Not on file  Physical Activity: Not on file  Stress: Not on file  Social Connections: Not on file  Intimate Partner Violence: Not on file   Family History  Problem Relation Age of Onset   Heart attack Father    Lung cancer Sister       VITAL SIGNS BP (!) 128/44   Pulse 65   Temp 98.2 F (36.8 C)   Resp 18   Ht 6' (1.829 m)   Wt 293 lb 4.8 oz (133 kg)   SpO2 95%   BMI 39.78 kg/m   Outpatient Encounter Medications as of 06/24/2021  Medication Sig   acetaminophen (TYLENOL) 500 MG tablet Take 500 mg by mouth every 6 (six) hours.   Balsam [place]-Castor Oil (VENELEX)  OINT Apply topical to sacrum and bilateral buttocks three times daily.   carvedilol (COREG) 6.25 MG tablet Take 1.5 tablets (9.375 mg total) by mouth 2 (two) times daily.   Dextromethorphan-guaiFENesin (ROBITUSSIN DM PO) Take 15 mLs by mouth every 6 (six) hours as needed (cough).   DOXYCYCLINE HYCLATE PO Take 100 mg by mouth in the morning and at bedtime. for left axilla cyst   fentaNYL (DURAGESIC) 12 MCG/HR Place 1 patch onto the skin every 3 (three) days.   finasteride (PROSCAR) 5 MG tablet Take 1 tablet (5 mg total) by mouth daily.   gabapentin (NEURONTIN) 100 MG capsule Take 1 capsule (100 mg total) by mouth daily.   guaiFENesin (MUCINEX) 600 MG 12 hr tablet Take 600 mg by mouth 2 (two) times daily.   hydrALAZINE (APRESOLINE) 50 MG tablet Take 1 tablet (50 mg total) by mouth 3 (three) times daily.   melatonin 5 MG TABS Take 5 mg by mouth at bedtime.   methimazole (TAPAZOLE) 10 MG tablet Take 2 tablets (20 mg total) by mouth 2 (two) times daily.   NON FORMULARY Diet - Regular   ondansetron (ZOFRAN-ODT) 4 MG disintegrating tablet Take 4 mg by mouth 4 (four) times daily -  before meals and at bedtime.   polyethylene glycol (MIRALAX / GLYCOLAX) 17 g packet Take 17 g by mouth daily.   potassium chloride SA (KLOR-CON M) 20 MEQ tablet Take 1 tablet (20 mEq total) by mouth 2 (two) times daily.   sertraline (ZOLOFT) 50 MG tablet Take 50 mg by mouth daily. situational depression   tamsulosin (FLOMAX) 0.4 MG CAPS capsule Take 1 capsule (0.4 mg total) by mouth every morning.   No facility-administered encounter medications on file as of 06/24/2021.     SIGNIFICANT DIAGNOSTIC EXAMS   PREVIOUS   05-06-21: chest x-ray: No active disease.   05-07-21: pelvic ultrasound:  Foley catheter appears to be in the central low pelvis in the region of the decompressed bladder. For definitive localization a pelvic CT scan is suggested.  05-08-21: ct of abdomen and pelvis 1. Focal soft tissue of the left lateral  bladder wall, concerning for bladder neoplasm. Recommend cystoscopy for further evaluation. 2. Mildly enlarged left pelvic lymph nodes, new when compared with prior exam. 3. New indeterminate right adrenal gland nodule. Recommend adrenal protocol CT or MRI for further evaluation. 4. Foley catheter balloon is inflated within the prostate. Recommend repositioning. 5. Aortic Atherosclerosis  and Emphysema .   05-29-21: ct of head: No acute intracranial abnormality.   05-29-21: left shoulder x-ray: Displaced oblique fracture proximal LEFT humeral diaphysis.  06-13-21: left upper extremity venous doppler:  No dvt within the visualized veins of left upper extremity Enlarged and heterogeneous left thyroid nodule may be seen in hashimotos thyroiditis.   NO NEW EXAMS.   LABS REVIEWED; PREVIOUS   05-06-21: wbc 8.1; hgb 9.4; hct 29.2 mcv 92.1 plt 215; glucose 100 bun 28; creat 1.69; k+ 4.3; na++ 138; ca 8.3; GFR 40; alk phos 198; protein 6.7 albumin 2.9 urine culture: multiple species 05-07-21: free t3: 1.8; free t4: 1.24; vitamin B 12: 227; B 1: 104.4 05-08-21: urine culture: 10,000 enterococcus faecalis 05-09-21: wbc 7.0; hgb 8.4; hct 25.7; mcv 92.4 plt 183; glucose 117; bun 20; creat 1.39; k+ 3.4; na++ 137; ca 7.7; gfr 50; mag 2.2 05-16-21; hgb 8.6; hct 27.6; glucose 123; bun 44; creat 1.44; k+ 3.4; na++ 139; ca 7.8; GFR 48 05-18-21: wbc 10.1; hgb 9.6; hct 30.9; mcv 91.4 plt 256; glucose 123; bun 43; creat 1.40 ;k+ 4.1; na++ 139; ca 7.9; GFR 50; urine culture: 60,000 colonies enterococcus faecalis  05-20-21: k+ 4.7 05-29-21: wbc 8.8; hgb 8.4; hct 26.6; mcv 90.5 plt 211; glucose 128; bun 30; creat 1.48; k+ 4.2; na+= 137; ca 7.7; gfr 46  06-03-21: penile drainage: proteus mirabilis: cipro 06-06-21: iron 38; tibc 136  NO NEW LABS.   Review of Systems  Reason unable to perform ROS: sleeping.   Physical Exam Constitutional:      General: Derek Foster is not in acute distress.    Appearance: Derek Foster is well-developed. Derek Foster  is not diaphoretic.  Neck:     Thyroid: No thyromegaly.  Cardiovascular:     Rate and Rhythm: Normal rate and regular rhythm.     Heart sounds: Normal heart sounds.  Pulmonary:     Effort: Pulmonary effort is normal. No respiratory distress.     Breath sounds: Normal breath sounds.  Abdominal:     General: Bowel sounds are normal. There is no distension.     Palpations: Abdomen is soft.     Tenderness: There is no abdominal tenderness.  Musculoskeletal:     Cervical back: Neck supple.     Right lower leg: No edema.     Left lower leg: No edema.     Comments:   Left arm in splint and sling  Left arm with swelling present from fingers to shoulder  Lymphadenopathy:     Cervical: No cervical adenopathy.  Skin:    General: Skin is warm and dry.  Neurological:     Comments: Is aware      ASSESSMENT/ PLAN:  TODAY  Failure to thrive in adult Acute adjustment disorder with mixed anxiety and depressed mood.   Will focus upon his comfort; Derek Foster is able to take medications at this time.  Will monitor his status; at this time Derek Foster is terminal.    Derek Edwards Derek Foster [facility]   call [phone]

## 2021-06-25 ENCOUNTER — Telehealth: Payer: Self-pay | Admitting: Orthopedic Surgery

## 2021-06-25 DIAGNOSIS — F4323 Adjustment disorder with mixed anxiety and depressed mood: Secondary | ICD-10-CM | POA: Insufficient documentation

## 2021-06-25 DIAGNOSIS — R627 Adult failure to thrive: Secondary | ICD-10-CM | POA: Insufficient documentation

## 2021-06-25 NOTE — Telephone Encounter (Signed)
Call returned to patient per voice message received from designated contact Malachy Mood [phone] - returned call, reached voice mail, left message to call us back so we may assist.

## 2021-06-26 ENCOUNTER — Ambulatory Visit (HOSPITAL_COMMUNITY): Payer: Medicare Other

## 2021-06-27 ENCOUNTER — Ambulatory Visit: Payer: Medicare Other | Admitting: Urology

## 2021-07-09 ENCOUNTER — Telehealth: Payer: Self-pay | Admitting: Orthopedic Surgery

## 2021-07-09 NOTE — Telephone Encounter (Signed)
Patient's son Derek Foster called to relay that patient has passed away.

## 2021-07-12 ENCOUNTER — Encounter: Payer: Medicare Other | Admitting: Orthopedic Surgery

## 2021-07-20 DEATH — deceased

## 2021-09-06 ENCOUNTER — Ambulatory Visit: Payer: Medicare Other | Admitting: Cardiology

## 2022-03-20 ENCOUNTER — Encounter: Payer: Self-pay | Admitting: Radiology
